# Patient Record
Sex: Female | Born: 1950 | Race: White | Hispanic: No | Smoking: Current some day smoker
Health system: Southern US, Community
[De-identification: ages and names within clinical notes are randomized; demographics above are authoritative.]

## PROBLEM LIST (undated history)

## (undated) DIAGNOSIS — J449 Chronic obstructive pulmonary disease, unspecified: Secondary | ICD-10-CM

## (undated) DIAGNOSIS — K219 Gastro-esophageal reflux disease without esophagitis: Secondary | ICD-10-CM

## (undated) DIAGNOSIS — I10 Essential (primary) hypertension: Secondary | ICD-10-CM

## (undated) DIAGNOSIS — F32A Depression, unspecified: Secondary | ICD-10-CM

## (undated) DIAGNOSIS — F329 Major depressive disorder, single episode, unspecified: Secondary | ICD-10-CM

## (undated) DIAGNOSIS — E785 Hyperlipidemia, unspecified: Secondary | ICD-10-CM

## (undated) HISTORY — DX: Essential (primary) hypertension: I10

## (undated) HISTORY — DX: Chronic obstructive pulmonary disease, unspecified: J44.9

## (undated) HISTORY — DX: Gastro-esophageal reflux disease without esophagitis: K21.9

## (undated) HISTORY — DX: Major depressive disorder, single episode, unspecified: F32.9

## (undated) HISTORY — DX: Depression, unspecified: F32.A

## (undated) HISTORY — DX: Hyperlipidemia, unspecified: E78.5

---

## 2002-06-06 HISTORY — PX: SHOULDER SURGERY: SHX246

## 2003-05-02 ENCOUNTER — Emergency Department (HOSPITAL_COMMUNITY): Admission: EM | Admit: 2003-05-02 | Discharge: 2003-05-02 | Payer: Self-pay | Admitting: *Deleted

## 2003-06-23 ENCOUNTER — Ambulatory Visit (HOSPITAL_COMMUNITY): Admission: RE | Admit: 2003-06-23 | Discharge: 2003-06-23 | Payer: Self-pay | Admitting: Orthopedic Surgery

## 2003-07-26 ENCOUNTER — Emergency Department (HOSPITAL_COMMUNITY): Admission: EM | Admit: 2003-07-26 | Discharge: 2003-07-26 | Payer: Self-pay | Admitting: Emergency Medicine

## 2003-10-05 HISTORY — PX: NECK SURGERY: SHX720

## 2003-10-16 ENCOUNTER — Inpatient Hospital Stay (HOSPITAL_COMMUNITY): Admission: RE | Admit: 2003-10-16 | Discharge: 2003-10-17 | Payer: Self-pay | Admitting: Orthopaedic Surgery

## 2003-10-29 ENCOUNTER — Emergency Department (HOSPITAL_COMMUNITY): Admission: EM | Admit: 2003-10-29 | Discharge: 2003-10-29 | Payer: Self-pay | Admitting: Emergency Medicine

## 2003-12-29 ENCOUNTER — Emergency Department (HOSPITAL_COMMUNITY): Admission: EM | Admit: 2003-12-29 | Discharge: 2003-12-29 | Payer: Self-pay

## 2004-06-07 ENCOUNTER — Emergency Department (HOSPITAL_COMMUNITY): Admission: EM | Admit: 2004-06-07 | Discharge: 2004-06-07 | Payer: Self-pay | Admitting: Family Medicine

## 2004-08-04 ENCOUNTER — Emergency Department: Payer: Self-pay | Admitting: Emergency Medicine

## 2005-02-13 ENCOUNTER — Emergency Department: Payer: Self-pay | Admitting: Unknown Physician Specialty

## 2005-03-19 ENCOUNTER — Emergency Department (HOSPITAL_COMMUNITY): Admission: EM | Admit: 2005-03-19 | Discharge: 2005-03-19 | Payer: Self-pay | Admitting: Emergency Medicine

## 2008-08-27 ENCOUNTER — Ambulatory Visit: Payer: Self-pay | Admitting: Unknown Physician Specialty

## 2008-09-09 ENCOUNTER — Ambulatory Visit: Payer: Self-pay | Admitting: Unknown Physician Specialty

## 2009-08-03 ENCOUNTER — Emergency Department (HOSPITAL_COMMUNITY): Admission: EM | Admit: 2009-08-03 | Discharge: 2009-08-04 | Payer: Self-pay | Admitting: Emergency Medicine

## 2010-06-27 ENCOUNTER — Encounter: Payer: Self-pay | Admitting: Orthopaedic Surgery

## 2010-10-22 NOTE — Op Note (Signed)
Terri Burns, Terri Burns                            ACCOUNT NO.:  [id number]   MEDICAL RECORD NO.:  [id number]                   PATIENT TYPE:  AMB   LOCATION:  DAY                                  FACILITY:  [facility]   PHYSICIAN:  Ronald A. Darrelyn Hillock, M.D.             DATE OF BIRTH:  Oct 13, 1950   DATE OF PROCEDURE:  06/23/2003  DATE OF DISCHARGE:                                 OPERATIVE REPORT   PREOPERATIVE DIAGNOSES:  1. Frozen shoulder, left shoulder.  2. Previous arthroscopic repair of rotator cuff and resection of distal     clavicle by another physician at [facility].   OPERATION:  1. Closed manipulation of the left shoulder under general anesthesia.  2. Injection of the left shoulder with 1 mL Depo-Medrol, 5 mL 0.5% Marcaine     and epinephrine.   SURGEON:  Georges Lynch. Darrelyn Hillock, M.D.   ASSISTANT:  Nurse.   DESCRIPTION OF PROCEDURE:  Under general anesthesia, a routine closed  manipulation of the left shoulder was carried out.  This manipulation was  done in a very gentle, easy manner.  Following this I did a sterile prep and  injected the left shoulder with 1 mL Depo-Medrol, 0.5 mL 0.5% Marcaine.  The  patient left the operating room in satisfactory condition.   FOLLOW-UP CARE:  She is going to be started immediately on therapy tomorrow.                                               Ronald A. Darrelyn Hillock, M.D.    RAG/MEDQ  D:  06/23/2003  T:  06/23/2003  Job:  [id number]

## 2010-10-22 NOTE — Op Note (Signed)
Terri Burns, Terri Burns                            ACCOUNT NO.:  [id number]   MEDICAL RECORD NO.:  [id number]                   PATIENT TYPE:  INP   LOCATION:  5039                                 FACILITY:  [facility]   PHYSICIAN:  Sharolyn Douglas, M.D.                     DATE OF BIRTH:  [birth year]   DATE OF PROCEDURE:  10/16/2003  DATE OF DISCHARGE:                                 OPERATIVE REPORT   DIAGNOSIS:  Degenerative disk disease, C5-6 and C6-7.   PROCEDURES:  1. Anterior cervical diskectomy, C5-6.  2. Anterior cervical arthrodesis, C5-6, placement of 7 mm allograft     prosthesis spacer packed with local autogenous bone graft.  3. Anterior cervical plating, C5-6, with the [employer] system.   SURGEON:  Sharolyn Douglas, M.D.   ASSISTANT:  Verlin Fester, P.A.   ANESTHESIA:  General endotracheal.   COMPLICATIONS:  None.   INDICATIONS:  The patient is a 60 year old female with a chronic history of  neck and left upper extremity pain.  She has advanced degenerative changes  at C5-6 and C6-7 with foraminal narrowing.  Her symptoms are felt to be  partially due to her neck and her left shoulder.  She is status post left  shoulder rotator cuff repair as well as closed manipulation.  Because of her  persistent symptoms unresponsive to conservative care, she has elected to  undergo ACDF at C5-6 and C6-7.  Intraoperatively we found the C6-7 level to  be autofused, and therefore the operation was limited to C5-6.   PROCEDURE:  The patient was properly identified in the holding area and  taken to the operating room.  She underwent general endotracheal anesthesia  without difficulty, given prophylactic IV antibiotics.  Carefully positioned  supine with the neck on the [place] head rest.  She was placed in slight  extension, five pounds of Holter traction applied, neck prepped and draped  in the usual sterile fashion.  A 4 cm incision made, left side of the neck,  level of the cricoid  cartilage.  Dissection carried sharply through the  platysma.  The interval between the SCM and strap muscles carried down to  the prevertebral space.  Esophagus, trachea, carotid sheath identified and  protected at all times.  C5-6 level easily identified by the large anterior  osteophytes.  Spinal needle placed confirming this location.  The longus  colli muscle was elevated out over the C5-6 and C6-7 disk spaces.  Deep  retractor placed.  Large anterior osteophytes at C5-6 were removed with  Leksell rongeur.  We found the disk space to be collapsed.  Caspar  distraction pins were placed in the C5 and C6 vertebral bodies.  Gentle  distraction was applied.  Curettes were used to complete a diskectomy back  to the posterior longitudinal ligament.  We used a high-speed bur to remove  the cartilaginous end  plates as well as the uncovertebral spurs.  Wide  foraminotomies were carried out bilaterally.  We then placed a 7 mm  allograft prosthesis spacer which had been packed with the local bone graft  obtained from the bur shavings.  This was carefully counter sunk 1 mm.  We  then turned our attention to exploring the C6-7 disk space.  There had been  an autofusion.  It was felt that the MRI scan showed patency of the foramen  and spinal canal and nothing would be gained by taking down the autofusion.  We elected to limit the operation to C5-6.  We then placed an anterior  cervical plate at E4-5 with four 12 mm screws.  We had excellent screw  purchase.  We ensured the locking mechanism engaged.  The wound was  irrigated.  Bleeding was controlled with bipolar electrocautery and Gelfoam.  The esophagus, trachea, and carotid sheath were examined.  There were no  apparent injuries.  We placed a deep Penrose drain.  Platysma closed with  interrupted 2-0 Vicryl, the subcutaneous layer closed with 3-0 Vicryl,  followed by a running 4-0 subcuticular Vicryl suture on the skin.  Benzoin  and Steri-Strips  placed.  Soft collar applied.  The patient was extubated  without difficulty, transferred to the recovery room in stable condition  able to move her upper and lower extremities.                                               Sharolyn Douglas, M.D.    MC/MEDQ  D:  10/16/2003  T:  10/16/2003  Job:  [id number]

## 2010-10-22 NOTE — H&P (Signed)
NAME:  Terri Burns, Terri Burns                            ACCOUNT NO.:  [id number]   MEDICAL RECORD NO.:  [id number]                   PATIENT TYPE:  INP   LOCATION:                                       FACILITY:  [facility]   PHYSICIAN:  Sharolyn Douglas, M.D.                     DATE OF BIRTH:  28-Mar-1951   DATE OF ADMISSION:  10/16/2003  DATE OF DISCHARGE:                                HISTORY & PHYSICAL   CHIEF COMPLAINT:  Pain in my neck and left shoulder.   HISTORY OF PRESENT ILLNESS:  This is a 60 year old lady with persistent left  shoulder pain.  The patient had undergone repair of the rotator cuff of the  left shoulder hoping that was the origin of her left shoulder pain, but  unfortunately this did not relieve all of her discomfort.  This surgery was  done by Dr. Darrelyn Hillock.  He ordered a cervical spine MRI which showed  degenerative disk disease at C5-6 and C6-7, with spondylosis.  Also, a small  protrusion was seen at C2, C3, C4, and C5.  She was referred to Dr. Noel Gerold  for further evaluation by Dr. Darrelyn Hillock.   We found this lady to be in rather persistent discomfort with a burning type  aching pain with radiation from the neck into the left shoulder.  She has  guarded the left upper extremity, and as far as her shoulder is concerned,  aggravates and exacerbates her discomfort.  She also has difficulty in range  of motion of the cervical spine as well.  All planes are noted to be quite  guarded with muscle spasms as well as increasing pain.  Fortunately, sensory  and motor is intact to the upper extremities.  It is felt this patient who  has had difficulty tolerating any non-steroidal anti-inflammatories, as well  as analgesics and muscle relaxants would benefit from surgery for  intervention, and is being admitted to [facility] for anterior cervical  diskectomy and fusion at C5-6 and C6-7, with allograft and plates.   We have reviewed her medical information from [facility]  in  [place], [place], from her doctor, Dr. Darden Dates K. Reddick.  She has been  cleared for surgery.  She has normal kidney function as well as thyroid.  CBC's were normal, as well as her chest x-ray.  Her triglycerides were  elevated at 286, as well as the sodium down at 132, otherwise normal  metabolic panel.   ALLERGIES:  The patient has extreme difficulty with any anti-inflammatories,  with nausea, vomiting, diarrhea, and dizziness.  She has no true medical  allergies as far as anaphylaxis is concerned.   CURRENT MEDICATIONS:  1. Enalapril/HCTZ 10/25 mg one daily.  2. She was given a prescription today for Percocet 5/325 mg and Robaxin 500     mg.  3. Allegra.  She is being treated by Dr. Kandice Robinsons for hypertension.   PAST SURGICAL HISTORY:  1. Basal cell carcinoma removed from her nose without sequela.  2. Left shoulder surgeries in May 2004 and January 2005.   FAMILY HISTORY:  Positive for heart disease and hypertension.   SOCIAL HISTORY:  The patient is a homemaker.  She smokes about 1/2 pack of  cigarettes per day.  Has three children.  Her husband will be the major  caregiver.   REVIEW OF SYSTEMS:  CNS:  No seizures, shoulder paralysis, numbness, double  vision.  The patient does have radicular pain in the left cervical  musculature as well as left shoulder.  CARDIOVASCULAR:  No chest pain, no  angina, no orthopnea.  RESPIRATORY:  No productive cough, no hemoptysis, no  shortness of breath.  GASTROINTESTINAL:  No nausea, vomiting, melena, or  bloody stools.  GENITOURINARY:  The patient has urinary frequency without  dysuria, burning, or hematuria.  MUSCULOSKELETAL:  Primarily in present  illness with her neck and left shoulder.   PHYSICAL EXAMINATION:  GENERAL:  Alert, cooperative, friendly, somewhat  nervous 60 year old white female.  She is accompanied by her husband and her  newborn grandchild whom they are taking care of as the mother had a  difficult delivery with  post-delivery problems.  The daughter remains  hospitalized.  VITAL SIGNS:  Blood pressure 132/86, pulse 74, respirations 12.  HEENT:  Normocephalic.  Wears glasses.  PERRLA.  EOM intact.  Oropharynx is  clear.  CHEST:  Clear to auscultation, no rhonchi, no rales.  HEART:  Regular rate and rhythm, no murmurs are heard.  ABDOMEN:  Soft, nontender, liver and spleen not felt.  GENITOURINARY:  Not done, not pertinent to present illness.  RECTAL:  Not done, not pertinent to present illness.  BREASTS:  Not done, not pertinent to present illness.  EXTREMITIES:  As in present illness above.   ADMISSION DIAGNOSES:  1. Cervical spondylosis.  2. Hypertension.   PLAN:  The patient is being admitted for anterior cervical diskectomy and  fusion of C5-6 and C6-7 with allograft and plate.  She has been fitted with  a cervical collar today that she will be wearing postoperatively.  She will  probably have a rigid collar postoperatively.  I advised her that it would  be best for her to quit smoking or even to cut back as much as she possibly  could so as not to affect the healing and bony union.  The complications of  surgery, including failure of hardware and possible non-union have been  discussed with the patient and her husband again today.      Dooley L. Cherlynn June.                 Sharolyn Douglas, M.D.    DLU/MEDQ  D:  10/09/2003  T:  10/09/2003  Job:  [id number]   cc:   Dr. Thayer Headings. [facility]  [address]  [place], [place]  fax # [phone]

## 2011-03-09 ENCOUNTER — Ambulatory Visit: Payer: Self-pay | Admitting: Anesthesiology

## 2011-05-03 ENCOUNTER — Ambulatory Visit: Payer: Self-pay | Admitting: Specialist

## 2011-06-21 DIAGNOSIS — J449 Chronic obstructive pulmonary disease, unspecified: Secondary | ICD-10-CM | POA: Diagnosis not present

## 2011-06-21 DIAGNOSIS — R0602 Shortness of breath: Secondary | ICD-10-CM | POA: Diagnosis not present

## 2011-06-21 DIAGNOSIS — R05 Cough: Secondary | ICD-10-CM | POA: Diagnosis not present

## 2011-06-21 DIAGNOSIS — R059 Cough, unspecified: Secondary | ICD-10-CM | POA: Diagnosis not present

## 2011-06-21 DIAGNOSIS — R918 Other nonspecific abnormal finding of lung field: Secondary | ICD-10-CM | POA: Diagnosis not present

## 2011-08-31 DIAGNOSIS — J45909 Unspecified asthma, uncomplicated: Secondary | ICD-10-CM | POA: Diagnosis not present

## 2011-08-31 DIAGNOSIS — I10 Essential (primary) hypertension: Secondary | ICD-10-CM | POA: Diagnosis not present

## 2011-08-31 DIAGNOSIS — K219 Gastro-esophageal reflux disease without esophagitis: Secondary | ICD-10-CM | POA: Diagnosis not present

## 2011-12-05 DIAGNOSIS — J449 Chronic obstructive pulmonary disease, unspecified: Secondary | ICD-10-CM | POA: Diagnosis not present

## 2011-12-05 DIAGNOSIS — F3289 Other specified depressive episodes: Secondary | ICD-10-CM | POA: Diagnosis not present

## 2011-12-05 DIAGNOSIS — K219 Gastro-esophageal reflux disease without esophagitis: Secondary | ICD-10-CM | POA: Diagnosis not present

## 2011-12-05 DIAGNOSIS — F329 Major depressive disorder, single episode, unspecified: Secondary | ICD-10-CM | POA: Diagnosis not present

## 2011-12-05 DIAGNOSIS — G8929 Other chronic pain: Secondary | ICD-10-CM | POA: Diagnosis not present

## 2011-12-05 DIAGNOSIS — Z79899 Other long term (current) drug therapy: Secondary | ICD-10-CM | POA: Diagnosis not present

## 2011-12-05 DIAGNOSIS — I10 Essential (primary) hypertension: Secondary | ICD-10-CM | POA: Diagnosis not present

## 2012-01-05 DIAGNOSIS — I10 Essential (primary) hypertension: Secondary | ICD-10-CM | POA: Diagnosis not present

## 2012-01-05 DIAGNOSIS — M5030 Other cervical disc degeneration, unspecified cervical region: Secondary | ICD-10-CM | POA: Diagnosis not present

## 2012-03-26 DIAGNOSIS — Z23 Encounter for immunization: Secondary | ICD-10-CM | POA: Diagnosis not present

## 2012-04-26 DIAGNOSIS — M542 Cervicalgia: Secondary | ICD-10-CM | POA: Diagnosis not present

## 2012-05-25 ENCOUNTER — Ambulatory Visit: Payer: Self-pay | Admitting: Family Medicine

## 2012-05-25 DIAGNOSIS — R6884 Jaw pain: Secondary | ICD-10-CM | POA: Diagnosis not present

## 2012-05-25 DIAGNOSIS — M542 Cervicalgia: Secondary | ICD-10-CM | POA: Diagnosis not present

## 2012-06-25 DIAGNOSIS — M542 Cervicalgia: Secondary | ICD-10-CM | POA: Diagnosis not present

## 2012-07-26 DIAGNOSIS — M5412 Radiculopathy, cervical region: Secondary | ICD-10-CM | POA: Diagnosis not present

## 2012-07-26 DIAGNOSIS — J019 Acute sinusitis, unspecified: Secondary | ICD-10-CM | POA: Diagnosis not present

## 2012-08-23 DIAGNOSIS — I10 Essential (primary) hypertension: Secondary | ICD-10-CM | POA: Diagnosis not present

## 2012-08-23 DIAGNOSIS — M5030 Other cervical disc degeneration, unspecified cervical region: Secondary | ICD-10-CM | POA: Diagnosis not present

## 2012-08-29 ENCOUNTER — Ambulatory Visit: Payer: Self-pay | Admitting: Family Medicine

## 2012-08-29 DIAGNOSIS — Z9889 Other specified postprocedural states: Secondary | ICD-10-CM | POA: Diagnosis not present

## 2012-08-29 DIAGNOSIS — M5030 Other cervical disc degeneration, unspecified cervical region: Secondary | ICD-10-CM | POA: Diagnosis not present

## 2012-08-29 DIAGNOSIS — M5020 Other cervical disc displacement, unspecified cervical region: Secondary | ICD-10-CM | POA: Diagnosis not present

## 2012-09-21 DIAGNOSIS — M47812 Spondylosis without myelopathy or radiculopathy, cervical region: Secondary | ICD-10-CM | POA: Diagnosis not present

## 2012-09-21 DIAGNOSIS — J449 Chronic obstructive pulmonary disease, unspecified: Secondary | ICD-10-CM | POA: Diagnosis not present

## 2013-03-18 DIAGNOSIS — Z23 Encounter for immunization: Secondary | ICD-10-CM | POA: Diagnosis not present

## 2013-05-27 DIAGNOSIS — Z01419 Encounter for gynecological examination (general) (routine) without abnormal findings: Secondary | ICD-10-CM | POA: Diagnosis not present

## 2013-05-27 DIAGNOSIS — Z23 Encounter for immunization: Secondary | ICD-10-CM | POA: Diagnosis not present

## 2013-05-27 DIAGNOSIS — J449 Chronic obstructive pulmonary disease, unspecified: Secondary | ICD-10-CM | POA: Diagnosis not present

## 2013-05-27 DIAGNOSIS — Z0000 Encounter for general adult medical examination without abnormal findings: Secondary | ICD-10-CM | POA: Diagnosis not present

## 2013-05-29 DIAGNOSIS — E876 Hypokalemia: Secondary | ICD-10-CM | POA: Diagnosis not present

## 2013-06-03 ENCOUNTER — Inpatient Hospital Stay: Payer: Self-pay | Admitting: Student

## 2013-06-03 DIAGNOSIS — I10 Essential (primary) hypertension: Secondary | ICD-10-CM | POA: Diagnosis not present

## 2013-06-03 DIAGNOSIS — J449 Chronic obstructive pulmonary disease, unspecified: Secondary | ICD-10-CM | POA: Diagnosis not present

## 2013-06-03 DIAGNOSIS — E871 Hypo-osmolality and hyponatremia: Secondary | ICD-10-CM | POA: Diagnosis not present

## 2013-06-03 DIAGNOSIS — F3289 Other specified depressive episodes: Secondary | ICD-10-CM | POA: Diagnosis present

## 2013-06-03 DIAGNOSIS — G459 Transient cerebral ischemic attack, unspecified: Secondary | ICD-10-CM | POA: Diagnosis not present

## 2013-06-03 DIAGNOSIS — R062 Wheezing: Secondary | ICD-10-CM | POA: Diagnosis not present

## 2013-06-03 DIAGNOSIS — F329 Major depressive disorder, single episode, unspecified: Secondary | ICD-10-CM | POA: Diagnosis present

## 2013-06-03 DIAGNOSIS — I2510 Atherosclerotic heart disease of native coronary artery without angina pectoris: Secondary | ICD-10-CM | POA: Diagnosis present

## 2013-06-03 DIAGNOSIS — F17200 Nicotine dependence, unspecified, uncomplicated: Secondary | ICD-10-CM | POA: Diagnosis present

## 2013-06-03 DIAGNOSIS — J441 Chronic obstructive pulmonary disease with (acute) exacerbation: Secondary | ICD-10-CM | POA: Diagnosis not present

## 2013-06-03 DIAGNOSIS — Z8249 Family history of ischemic heart disease and other diseases of the circulatory system: Secondary | ICD-10-CM | POA: Diagnosis not present

## 2013-06-03 DIAGNOSIS — E876 Hypokalemia: Secondary | ICD-10-CM | POA: Diagnosis not present

## 2013-06-03 DIAGNOSIS — I959 Hypotension, unspecified: Secondary | ICD-10-CM | POA: Diagnosis not present

## 2013-06-03 DIAGNOSIS — Z880 Allergy status to penicillin: Secondary | ICD-10-CM | POA: Diagnosis not present

## 2013-06-03 DIAGNOSIS — J440 Chronic obstructive pulmonary disease with acute lower respiratory infection: Secondary | ICD-10-CM | POA: Diagnosis not present

## 2013-06-03 DIAGNOSIS — M1990 Unspecified osteoarthritis, unspecified site: Secondary | ICD-10-CM | POA: Diagnosis not present

## 2013-06-03 DIAGNOSIS — J438 Other emphysema: Secondary | ICD-10-CM | POA: Diagnosis not present

## 2013-06-03 DIAGNOSIS — J40 Bronchitis, not specified as acute or chronic: Secondary | ICD-10-CM | POA: Diagnosis not present

## 2013-06-03 LAB — BASIC METABOLIC PANEL
Chloride: 100 mmol/L (ref 98–107)
Co2: 24 mmol/L (ref 21–32)
EGFR (Non-African Amer.): >60
Sodium: 130 mmol/L — ABNORMAL LOW (ref 136–145)

## 2013-06-03 LAB — CBC
HCT: 37.8 % (ref 35.0–47.0)
MCH: 27.8 pg (ref 26.0–34.0)
MCV: 84 fL (ref 80–100)
RBC: 4.52 10*6/uL (ref 3.80–5.20)

## 2013-06-03 LAB — RAPID INFLUENZA A&B ANTIGENS

## 2013-06-03 LAB — TROPONIN I: Troponin-I: <0.02 ng/mL

## 2013-06-04 LAB — BASIC METABOLIC PANEL
BUN: 13 mg/dL (ref 7–18)
Calcium, Total: 8.4 mg/dL — ABNORMAL LOW (ref 8.5–10.1)
Chloride: 100 mmol/L (ref 98–107)
Creatinine: 0.76 mg/dL (ref 0.60–1.30)
Potassium: 2.9 mmol/L — ABNORMAL LOW (ref 3.5–5.1)
Sodium: 131 mmol/L — ABNORMAL LOW (ref 136–145)

## 2013-06-04 LAB — TROPONIN I
Troponin-I: <0.02 ng/mL
Troponin-I: <0.02 ng/mL

## 2013-06-04 LAB — CK-MB
CK-MB: 3.4 ng/mL (ref 0.5–3.6)
CK-MB: 3.8 ng/mL — ABNORMAL HIGH (ref 0.5–3.6)

## 2013-06-05 LAB — BASIC METABOLIC PANEL
Anion Gap: 9 (ref 7–16)
Calcium, Total: 8.1 mg/dL — ABNORMAL LOW (ref 8.5–10.1)
Chloride: 106 mmol/L (ref 98–107)
Creatinine: 0.59 mg/dL — ABNORMAL LOW (ref 0.60–1.30)
EGFR (African American): >60
EGFR (Non-African Amer.): >60
Glucose: 135 mg/dL — ABNORMAL HIGH (ref 65–99)
Osmolality: 276 (ref 275–301)
Potassium: 3.2 mmol/L — ABNORMAL LOW (ref 3.5–5.1)
Sodium: 137 mmol/L (ref 136–145)

## 2013-06-05 LAB — CBC WITH DIFFERENTIAL/PLATELET
Basophil %: 0.1 %
Eosinophil #: 0 10*3/uL (ref 0.0–0.7)
HCT: 31.9 % — ABNORMAL LOW (ref 35.0–47.0)
Lymphocyte %: 6.1 %
MCH: 28.2 pg (ref 26.0–34.0)
MCHC: 33.6 g/dL (ref 32.0–36.0)
MCV: 84 fL (ref 80–100)
Monocyte #: 0.4 x10 3/mm (ref 0.2–0.9)
Monocyte %: 4 %
Neutrophil #: 8.9 10*3/uL — ABNORMAL HIGH (ref 1.4–6.5)
Neutrophil %: 89.7 %
Platelet: 223 10*3/uL (ref 150–440)
RDW: 15.2 % — ABNORMAL HIGH (ref 11.5–14.5)
WBC: 9.9 10*3/uL (ref 3.6–11.0)

## 2013-06-05 LAB — MAGNESIUM: Magnesium: 1.7 mg/dL — ABNORMAL LOW

## 2013-06-08 LAB — CULTURE, BLOOD (SINGLE)

## 2013-06-09 LAB — CREATININE, SERUM
Creatinine: 0.48 mg/dL — ABNORMAL LOW (ref 0.60–1.30)
EGFR (Non-African Amer.): >60

## 2013-06-13 LAB — EXPECTORATED SPUTUM ASSESSMENT W REFEX TO RESP CULTURE

## 2013-06-14 DIAGNOSIS — J441 Chronic obstructive pulmonary disease with (acute) exacerbation: Secondary | ICD-10-CM | POA: Diagnosis not present

## 2013-06-14 DIAGNOSIS — E876 Hypokalemia: Secondary | ICD-10-CM | POA: Diagnosis not present

## 2013-08-06 ENCOUNTER — Ambulatory Visit: Payer: Self-pay | Admitting: Family Medicine

## 2013-08-06 DIAGNOSIS — J984 Other disorders of lung: Secondary | ICD-10-CM | POA: Diagnosis not present

## 2013-08-06 DIAGNOSIS — J8410 Pulmonary fibrosis, unspecified: Secondary | ICD-10-CM | POA: Diagnosis not present

## 2013-08-06 DIAGNOSIS — R059 Cough, unspecified: Secondary | ICD-10-CM | POA: Diagnosis not present

## 2013-08-06 DIAGNOSIS — J449 Chronic obstructive pulmonary disease, unspecified: Secondary | ICD-10-CM | POA: Diagnosis not present

## 2013-08-06 DIAGNOSIS — R05 Cough: Secondary | ICD-10-CM | POA: Diagnosis not present

## 2013-09-16 ENCOUNTER — Ambulatory Visit: Payer: Self-pay | Admitting: Family Medicine

## 2013-09-16 DIAGNOSIS — J449 Chronic obstructive pulmonary disease, unspecified: Secondary | ICD-10-CM | POA: Diagnosis not present

## 2013-11-08 DIAGNOSIS — I10 Essential (primary) hypertension: Secondary | ICD-10-CM | POA: Diagnosis not present

## 2013-11-08 DIAGNOSIS — M5030 Other cervical disc degeneration, unspecified cervical region: Secondary | ICD-10-CM | POA: Diagnosis not present

## 2013-11-08 DIAGNOSIS — J449 Chronic obstructive pulmonary disease, unspecified: Secondary | ICD-10-CM | POA: Diagnosis not present

## 2013-12-09 DIAGNOSIS — Z79899 Other long term (current) drug therapy: Secondary | ICD-10-CM | POA: Diagnosis not present

## 2013-12-09 DIAGNOSIS — I10 Essential (primary) hypertension: Secondary | ICD-10-CM | POA: Diagnosis not present

## 2013-12-09 DIAGNOSIS — F329 Major depressive disorder, single episode, unspecified: Secondary | ICD-10-CM | POA: Diagnosis not present

## 2013-12-09 DIAGNOSIS — F3289 Other specified depressive episodes: Secondary | ICD-10-CM | POA: Diagnosis not present

## 2013-12-09 DIAGNOSIS — G8929 Other chronic pain: Secondary | ICD-10-CM | POA: Diagnosis not present

## 2013-12-09 DIAGNOSIS — M5030 Other cervical disc degeneration, unspecified cervical region: Secondary | ICD-10-CM | POA: Diagnosis not present

## 2014-04-15 ENCOUNTER — Ambulatory Visit: Payer: Self-pay | Admitting: Family Medicine

## 2014-04-15 DIAGNOSIS — R05 Cough: Secondary | ICD-10-CM | POA: Diagnosis not present

## 2014-09-09 DIAGNOSIS — I10 Essential (primary) hypertension: Secondary | ICD-10-CM | POA: Diagnosis not present

## 2014-09-09 DIAGNOSIS — J302 Other seasonal allergic rhinitis: Secondary | ICD-10-CM | POA: Diagnosis not present

## 2014-09-09 DIAGNOSIS — J449 Chronic obstructive pulmonary disease, unspecified: Secondary | ICD-10-CM | POA: Diagnosis not present

## 2014-09-09 DIAGNOSIS — F329 Major depressive disorder, single episode, unspecified: Secondary | ICD-10-CM | POA: Diagnosis not present

## 2014-09-27 NOTE — H&P (Signed)
PATIENT NAME:  Terri Burns, Terri Burns MR#:  [id number] DATE OF BIRTH:  1950/08/28  DATE OF ADMISSION:  06/03/2013  PRIMARY CARE PHYSICIAN:  Dr. Manuella Ghazi  REFERRING PHYSICIAN:  Dr. Jacqualine Code  CHIEF COMPLAINT: Shortness of breath and wheezing.   HISTORY OF PRESENT ILLNESS: The patient is a 64 year old year-old pleasant Caucasian female with past medical history of chronic obstructive pulmonary disease, does not live on oxygen, hypertension and degenerative joint disease. presenting with two day history of shortness of breath as well wheezing. She has been getting coughing with minimal exertion and denies any phlegm. She received flu vaccine this year.  She is complaining of low-grade fever, feels congested. Her shortness of breath is getting worse, so she came into the ER. The patient was given nebulizer treatment and p.o. prednisone. Hospitalist team is called to admit the patient. Chest x-ray did not reveal any pneumonia and acute infiltrates. A flu test was negative in the ER. During my examination, the patient is still feeling short of breath, but much better. No family members at bedside. Denies any other complaints. She was admitted to Intensive Care Unit for chronic obstructive pulmonary disease exacerbation, never got intubated   PAST MEDICAL HISTORY: Hypertension, degenerative joint disease and chronic obstructive pulmonary disease.  PAST SURGICAL HISTORY: Shoulder surgery, neck surgery.   ALLERGIES: SHE IS ALLERGIC TO PENICILLIN GIVES HER ITCHING AND NSAIDS.    PSYCHOSOCIAL HISTORY: Lives at home with daughter. Smokes half pack a day, started smoking at age 70 and quit smoking 1 to 2 weeks ago. Denies alcohol or illicit drug usage.   FAMILY HISTORY: Mother disease transient ischemic attack had history of hypertension. Father had history of heart attack.   HOME MEDICATIONS: Symbicort 2 puffs inhalation 2 times a day, ProAir 2 puffs inhalation 4 times as needed, Prilosec 20 mg 2 times a day, Percocet  5/325 three times a day, metoprolol tartrate 100 mg 2 times a day, lisinopril/hydrochlorothiazide  20/12.5 two times a day, citalopram 40 mg once daily.    REVIEW OF SYSTEMS:   GENERAL: Complaining of low-grade fever. Complaining of fatigue and weakness.  EYES: Denies blurry vision, double vision.  ENT: Denies epistaxis or discharge.  RESPIRATION: Complaining of coughing, wheezing and chronic obstructive pulmonary disease.  CARDIOVASCULAR: Has chest tightness. Denies any palpitations.  GASTROINTESTINAL: Denies nausea, vomiting, diarrhea.  GENITOURINARY: No dysuria or hematuria.  GYNECOLOGIC AND BREASTS: Denies breast mass or vaginal discharge.  ENDOCRINE: Denies polyuria or nocturia or thyroid problems.  HEMATOLOGIC AND LYMPHATIC: No anemia, easy bruising, bleeding.  INTEGUMENTARY: No acne, rash, lesions.  MUSCULOSKELETAL: No joint pain in neck and back. She has chronic history of degenerative joint disease.  PSYCHIATRIC: No ADD, OCD.  NEUROLOGIC:  Denies vertigo, ataxia.   PHYSICAL EXAMINATION: VITAL SIGNS: Temperature 98.2, pulse 101, respirations 20, blood pressure is 101/69, pulse oximetry 96% on room air.  GENERAL APPEARANCE: Not in acute distress. Moderately built and nourished.  Looks pale. HEENT: Normocephalic, atraumatic. Pupils are equally reacting to light and accommodation. No scleral icterus. No conjunctival injection. Nose is stuffy, positive postnasal drip. Uvula is midline moist. Moist mucus membranes.  NECK: Supple. No JVD. No thyromegaly. Range of motion is intact.  LUNGS: Diffuse wheezing is present bilaterally. No accessory muscle usage. Moderate air entry. No crackles, no anterior chest wall tenderness on palpation.  CARDIAC: S1, S2 normal. Regular rate and rhythm. No murmurs.  GASTROINTESTINAL: Soft. Bowel sounds are positive in all four quadrants. Nontender, nondistended. No hepatosplenomegaly. No masses felt.  NEUROLOGIC: Awake  and oriented x 3. Motor and sensory are  grossly intact. Reflexes are 2+.  EXTREMITIES: No edema. No cyanosis. No clubbing.   SKIN: Warm to touch. Normal turgor. No rashes. No lesions.  MUSCULOSKELETAL: No joint effusion, tenderness, erythema.  PSYCHIATRIC: Normal mood and affect.   LABORATORY, DIAGNOSTIC AND RADIOLOGIC DATA:  Troponin less than 0.02. CBC normal. Influenza test is negative. PH 7.39, pCO2 36 BMP is normal except sodium is 130, potassium 2.8. Anion gap is 6.   Chest x-ray, PA and lateral views:  Lungs have hyperexpansion and bronchitic change without recent acute cardiopulmonary disease. Architectural distortion and volume loss involving the left mid and lower lung, similar to the remote chest CT.   A 12-lead EKG has revealed normal sinus rhythm, normal PR and QRS interval. Segment flattening was noted in V5 and V6 which suggests artifact, no acute ST-T wave changes.   ASSESSMENT AND PLAN: A 64 year old Caucasian female brought into the ER with a two day history of shortness of breath, wheezing and coughing. She will be admitted with following assessment and plan.  1.  Acute exacerbation of chronic obstructive pulmonary disease.  We will admit her to MedSurg floor. We will provide her IV Solu-Medrol, nebulizer treatments.  We will provide her Zosyn and Zithromax for possible acute bronchitis.  2.  Hypokalemia. The patient was given potassium supplement. We will check her potassium and magnesium in the morning.  3.  Hypertension. Resume her home medication. Blood pressure is on the lower side, we will of the hold off on the lisinopril HCTZ.  4.  Degenerative joint disease. Pain management on as needed basis.  5.  Tobacco dependent. The patient was counseled to quit smoking.  6.  We will provide her GI and deep vein thrombosis prophylaxis.   CODE STATUS: She is full code. Children are medical power of attorney.   Diagnosis and plan of care was discussed in detail with the patient and she agreed with the plan.   TOTAL  TIME SPENT ON ADMISSION:  45 minutes.    ____________________________ Nicholes Mango, MD ag:cc D: 06/04/2013 00:47:27 ET T: 06/04/2013 01:51:21 ET JOB#: [id number]  cc: Nicholes Mango, MD, <Dictator> Dr. Laverle Patter MD ELECTRONICALLY SIGNED 06/14/2013 7:17

## 2014-09-27 NOTE — Discharge Summary (Signed)
PATIENT NAME:  Terri Burns, Terri Burns MR#:  [id number] DATE OF BIRTH:  Jun 04, 1951  DATE OF ADMISSION:  06/03/2013 DATE OF DISCHARGE:  06/09/2013  CHIEF COMPLAINT: Shortness of breath and wheezing.   PRIMARY CARE PHYSICIAN: Dr. Manuella Ghazi.   DISCHARGE DIAGNOSES:  1. Acute chronic obstructive pulmonary disease exacerbation with acute bronchitis.  2. Hypokalemia.  3. Depression.  4. Hyponatremia.  5. Bout of hypotension, resolved.  6. Hypomagnesemia.   DISCHARGE MEDICATIONS: Lisinopril/HCTZ 20/12.5 one tab 2 times a day, Prilosec 20 mg daily x 2, citalopram 40 mg once a day, Symbicort 160/4.5 mcg 2 puffs 2 times a day, ProAir 2 puffs 4 times a day as needed for wheezing, Percocet 10/325 three times a day as needed 1 tab for pain, tiotropium 18 mcg inhaled 1 cap daily, prednisone taper 50 mg per day, then taper by 10 mg until done in 5 days, metoprolol 25 mg 2 times a day.   DIET: Low sodium.   ACTIVITY: As tolerated.   FOLLOWUP: Please follow with PCP within 1 to 2 weeks.   DISPOSITION: Home.   SIGNIFICANT LABS AND IMAGING: Troponins were negative x 3. Initial potassium 2.8, sodium 130, BUN 12, creatinine 0.91. White count of 10.4. Blood cultures negative. Rapid flu was negative. Sputum cultures have had colonies too small to read from the 3rd of January. Chest x-ray, PA and lateral, on admission showing lung hyperexpansion and bronchitic changes without acute cardiopulmonary disease. CT chest with contrast on January 2nd showing scattered pulmonary parenchymal scarring, appears postinfectious.   HISTORY OF PRESENT ILLNESS AND HOSPITAL COURSE: For full details of H and P, please see the dictation on December 29th by Dr. Margaretmary Eddy, but briefly, this is a 64 year old female with history of COPD, tobacco abuse, hypertension who presented with shortness of breath and wheezing for a couple of days. She has been having coughing with minimal exertion, without any sputum production. She was given some nebulizer  treatments and steroids, and hospitalist service was asked for admission. She was admitted to our group for acute COPD flare with acute bronchitis with IV steroids nebulizers. The patient was also started on antibiotics. Her hypokalemia was corrected. The patient did have mild hyponatremia and was started on some normal saline. The patient's blood pressure was also initially a little on the lower side, and some of the blood pressure medications were held. The patient was slow to recover; however, at this point, has done significantly better. She has been weaned off of oxygen, is ambulating. Her hypokalemia and mild hyponatremia have resolved. She was started on Spiriva, and at this point, she will be getting discharged with outpatient followup.   TOTAL TIME SPENT: 33 minutes.   The patient is FULL CODE.    ____________________________ Vivien Presto, MD sa:gb D: 06/09/2013 17:23:45 ET T: 06/09/2013 23:39:39 ET JOB#: [id number]  cc: Vivien Presto, MD, <Dictator> Dr. Lollie Marrow [facility] MD ELECTRONICALLY SIGNED 06/18/2013 11:29

## 2014-12-04 ENCOUNTER — Other Ambulatory Visit: Payer: Self-pay | Admitting: Family Medicine

## 2014-12-04 DIAGNOSIS — F329 Major depressive disorder, single episode, unspecified: Secondary | ICD-10-CM

## 2014-12-04 DIAGNOSIS — I10 Essential (primary) hypertension: Secondary | ICD-10-CM

## 2014-12-04 DIAGNOSIS — F32A Depression, unspecified: Secondary | ICD-10-CM

## 2014-12-04 MED ORDER — METOPROLOL SUCCINATE ER 25 MG PO TB24: 25.0000 mg | ORAL_TABLET | Freq: Every day | ORAL | Status: DC

## 2014-12-04 MED ORDER — CITALOPRAM HYDROBROMIDE 20 MG PO TABS: 20.0000 mg | ORAL_TABLET | Freq: Every day | ORAL | Status: DC

## 2015-05-08 ENCOUNTER — Telehealth: Payer: Self-pay | Admitting: Family Medicine

## 2015-05-08 DIAGNOSIS — F329 Major depressive disorder, single episode, unspecified: Secondary | ICD-10-CM

## 2015-05-08 DIAGNOSIS — F32A Depression, unspecified: Secondary | ICD-10-CM

## 2015-05-08 DIAGNOSIS — I10 Essential (primary) hypertension: Secondary | ICD-10-CM

## 2015-05-08 MED ORDER — CITALOPRAM HYDROBROMIDE 20 MG PO TABS: 20.0000 mg | ORAL_TABLET | Freq: Every day | ORAL | Status: DC

## 2015-05-08 MED ORDER — METOPROLOL SUCCINATE ER 25 MG PO TB24: 25.0000 mg | ORAL_TABLET | Freq: Every day | ORAL | Status: DC

## 2015-05-08 MED ORDER — BUDESONIDE-FORMOTEROL FUMARATE 160-4.5 MCG/ACT IN AERO: 2.0000 | INHALATION_SPRAY | Freq: Two times a day (BID) | RESPIRATORY_TRACT | Status: DC

## 2015-05-08 MED ORDER — OMEPRAZOLE 20 MG PO CPDR: 20.0000 mg | DELAYED_RELEASE_CAPSULE | Freq: Every day | ORAL | Status: DC

## 2015-05-08 MED ORDER — LISINOPRIL-HYDROCHLOROTHIAZIDE 20-25 MG PO TABS: 1.0000 | ORAL_TABLET | Freq: Every day | ORAL | Status: DC

## 2015-05-08 NOTE — Telephone Encounter (Signed)
Patient has no refills on Lisinopril, Metopolol, Citalopram, Omeprazole, and Symbicort. She has not been seen since May at the old office and she did schedule appointment for this coming Tuesday. She would like to know if you would send her refills to [facility] to last until her appointment.

## 2015-05-08 NOTE — Telephone Encounter (Signed)
Medication has been refilled and sent to [facility] [address] 

## 2015-05-12 ENCOUNTER — Encounter: Payer: Self-pay | Admitting: Family Medicine

## 2015-05-12 ENCOUNTER — Ambulatory Visit (INDEPENDENT_AMBULATORY_CARE_PROVIDER_SITE_OTHER): Payer: Medicare Other | Admitting: Family Medicine

## 2015-05-12 VITALS — BP 140/72 | HR 60 | Temp 98.0°F | Resp 16 | Ht 60.0 in | Wt 124.4 lb

## 2015-05-12 DIAGNOSIS — I10 Essential (primary) hypertension: Secondary | ICD-10-CM | POA: Insufficient documentation

## 2015-05-12 DIAGNOSIS — J302 Other seasonal allergic rhinitis: Secondary | ICD-10-CM | POA: Diagnosis not present

## 2015-05-12 DIAGNOSIS — F325 Major depressive disorder, single episode, in full remission: Secondary | ICD-10-CM | POA: Insufficient documentation

## 2015-05-12 DIAGNOSIS — K219 Gastro-esophageal reflux disease without esophagitis: Secondary | ICD-10-CM | POA: Diagnosis not present

## 2015-05-12 DIAGNOSIS — F32A Depression, unspecified: Secondary | ICD-10-CM

## 2015-05-12 DIAGNOSIS — J42 Unspecified chronic bronchitis: Secondary | ICD-10-CM

## 2015-05-12 DIAGNOSIS — Z23 Encounter for immunization: Secondary | ICD-10-CM

## 2015-05-12 DIAGNOSIS — J449 Chronic obstructive pulmonary disease, unspecified: Secondary | ICD-10-CM | POA: Insufficient documentation

## 2015-05-12 DIAGNOSIS — F329 Major depressive disorder, single episode, unspecified: Secondary | ICD-10-CM | POA: Diagnosis not present

## 2015-05-12 DIAGNOSIS — M5030 Other cervical disc degeneration, unspecified cervical region: Secondary | ICD-10-CM | POA: Insufficient documentation

## 2015-05-12 DIAGNOSIS — J439 Emphysema, unspecified: Secondary | ICD-10-CM | POA: Insufficient documentation

## 2015-05-12 MED ORDER — FLUTICASONE PROPIONATE 50 MCG/ACT NA SUSP: 2.0000 | Freq: Every day | NASAL | Status: DC

## 2015-05-12 MED ORDER — METOPROLOL SUCCINATE ER 25 MG PO TB24: 25.0000 mg | ORAL_TABLET | Freq: Every day | ORAL | Status: DC

## 2015-05-12 MED ORDER — ALBUTEROL SULFATE HFA 108 (90 BASE) MCG/ACT IN AERS: 2.0000 | INHALATION_SPRAY | Freq: Four times a day (QID) | RESPIRATORY_TRACT | Status: DC | PRN

## 2015-05-12 MED ORDER — LISINOPRIL-HYDROCHLOROTHIAZIDE 20-25 MG PO TABS: 1.0000 | ORAL_TABLET | Freq: Every day | ORAL | Status: DC

## 2015-05-12 MED ORDER — OMEPRAZOLE 20 MG PO CPDR: 20.0000 mg | DELAYED_RELEASE_CAPSULE | Freq: Every day | ORAL | Status: DC

## 2015-05-12 MED ORDER — BUDESONIDE-FORMOTEROL FUMARATE 160-4.5 MCG/ACT IN AERO: 2.0000 | INHALATION_SPRAY | Freq: Two times a day (BID) | RESPIRATORY_TRACT | Status: DC

## 2015-05-12 MED ORDER — CITALOPRAM HYDROBROMIDE 20 MG PO TABS: 20.0000 mg | ORAL_TABLET | Freq: Every day | ORAL | Status: DC

## 2015-05-12 NOTE — Progress Notes (Signed)
Name: Terri Burns   MRN: [id number]    DOB: 1951-04-27   Date:05/12/2015       Progress Note  Subjective  Chief Complaint  Chief Complaint  Patient presents with  . Medication Refill    ProAir Hfa  / fluticasone 13mcg  . Hypertension  . COPD    Hypertension This is a chronic problem. The problem is unchanged. The problem is controlled. Pertinent negatives include no blurred vision, chest pain, headaches, palpitations or shortness of breath. Past treatments include beta blockers, diuretics and ACE inhibitors. The current treatment provides significant improvement. There is no history of kidney disease, CAD/MI or CVA.  Depression      (Symptoms reasonably controlled as long as she is on medication.)  This is a chronic problem.  The onset quality is gradual.   Associated symptoms include no helplessness, no hopelessness, does not have insomnia, no decreased interest, no headaches and not sad.  Past treatments include SSRIs - Selective serotonin reuptake inhibitors.  Compliance with treatment is good.  Previous treatment provided significant relief. Gastroesophageal Reflux She reports no chest pain, no coughing, no dysphagia, no heartburn, no nausea or no sore throat. symptoms reasonably controlled as long as she is on medication.. This is a chronic problem. The problem has been unchanged. She has tried a PPI for the symptoms. The treatment provided significant relief.   COPD Pt. Has history of COPD, stable on Symbicort and Pro Air. She feels well, no coughing, wheezing, fevers, chills, or sputum production.   Runny Nose Pt. Requesting refill for Flonase, taken for runny nose, mostly from weather changes and allergies. Symptoms respond well to Flonase.   Past Medical History  Diagnosis Date  . Hypertension   . COPD (chronic obstructive pulmonary disease) ([place])     History reviewed. No pertinent past surgical history.  History reviewed. No pertinent family history.  Social History    Social History  . Marital Status: Widowed    Spouse Name: N/A  . Number of Children: N/A  . Years of Education: N/A   Occupational History  . Not on file.   Social History Main Topics  . Smoking status: Current Every Day Smoker -- 1.00 packs/day    Types: Cigarettes  . Smokeless tobacco: Never Used  . Alcohol Use: No  . Drug Use: No  . Sexual Activity: No   Other Topics Concern  . Not on file   Social History Narrative  . No narrative on file     Current outpatient prescriptions:  .  albuterol (PROAIR HFA) 108 (90 BASE) MCG/ACT inhaler, Inhale into the lungs., Disp: , Rfl:  .  budesonide-formoterol (SYMBICORT) 160-4.5 MCG/ACT inhaler, Inhale 2 puffs into the lungs 2 (two) times daily., Disp: 1 Inhaler, Rfl: 12 .  citalopram (CELEXA) 20 MG tablet, Take 1 tablet (20 mg total) by mouth daily., Disp: 30 tablet, Rfl: 0 .  fluticasone (FLONASE) 50 MCG/ACT nasal spray, Place into the nose., Disp: , Rfl:  .  lisinopril-hydrochlorothiazide (PRINZIDE,ZESTORETIC) 20-25 MG tablet, Take 1 tablet by mouth daily., Disp: 30 tablet, Rfl: 0 .  metoprolol succinate (TOPROL-XL) 25 MG 24 hr tablet, Take 1 tablet (25 mg total) by mouth daily., Disp: 30 tablet, Rfl: 0 .  omeprazole (PRILOSEC) 20 MG capsule, Take 1 capsule (20 mg total) by mouth daily., Disp: 30 capsule, Rfl: 0  Allergies  Allergen Reactions  . Levofloxacin Shortness Of Breath  . Penicillins Hives    itching     Review of Systems  Constitutional: Negative for fever and chills.  HENT: Negative for sore throat.   Eyes: Negative for blurred vision and double vision.  Respiratory: Negative for cough, hemoptysis, sputum production and shortness of breath.   Cardiovascular: Negative for chest pain and palpitations.  Gastrointestinal: Negative for heartburn, dysphagia and nausea.  Neurological: Negative for headaches.  Psychiatric/Behavioral: Negative for depression. The patient is not nervous/anxious and does not have  insomnia.     Objective  Filed Vitals:   05/12/15 1517  BP: 140/72  Pulse: 60  Temp: 98 F (36.7 C)  TempSrc: Oral  Resp: 16  Height: 5' (1.524 m)  Weight: 124 lb 6.4 oz (56.427 kg)  SpO2: 97%    Physical Exam  Constitutional: She is oriented to person, place, and time and well-developed, well-nourished, and in no distress.  HENT:  Head: Normocephalic and atraumatic.  Mouth/Throat: Oropharynx is clear and moist and mucous membranes are normal.  Cardiovascular: Normal rate, regular rhythm and normal heart sounds.   No murmur heard. Pulmonary/Chest: Effort normal and breath sounds normal. She has no wheezes.  Abdominal: Soft. Bowel sounds are normal. There is no tenderness.  Neurological: She is alert and oriented to person, place, and time.  Psychiatric: Mood, memory, affect and judgment normal.  Nursing note and vitals reviewed.    Assessment & Plan  1. Need for immunization against influenza  - Flu Vaccine QUAD 36+ mos PF IM (Fluarix & Fluzone Quad PF)  2. Essential hypertension BP stable and controlled on present therapy. - metoprolol succinate (TOPROL-XL) 25 MG 24 hr tablet; Take 1 tablet (25 mg total) by mouth daily.  Dispense: 90 tablet; Refill: 0 - lisinopril-hydrochlorothiazide (PRINZIDE,ZESTORETIC) 20-25 MG tablet; Take 1 tablet by mouth daily.  Dispense: 90 tablet; Refill: 0 - Comprehensive Metabolic Panel (CMET)  3. Allergic rhinitis, seasonal  - fluticasone (FLONASE) 50 MCG/ACT nasal spray; Place 2 sprays into both nostrils daily.  Dispense: 16 g; Refill: 0  4. Chronic bronchitis, unspecified chronic bronchitis type (HCC)  - albuterol (PROAIR HFA) 108 (90 BASE) MCG/ACT inhaler; Inhale 2 puffs into the lungs every 6 (six) hours as needed for wheezing or shortness of breath.  Dispense: 18 g; Refill: 2 - budesonide-formoterol (SYMBICORT) 160-4.5 MCG/ACT inhaler; Inhale 2 puffs into the lungs 2 (two) times daily.  Dispense: 1 Inhaler; Refill: 2  5.  Gastroesophageal reflux disease, esophagitis presence not specified  - omeprazole (PRILOSEC) 20 MG capsule; Take 1 capsule (20 mg total) by mouth daily.  Dispense: 90 capsule; Refill: 0  6. Depression  - citalopram (CELEXA) 20 MG tablet; Take 1 tablet (20 mg total) by mouth daily.  Dispense: 90 tablet; Refill: 0    Terri Burns A. [facility] 05/12/2015 4:00 PM

## 2015-08-10 ENCOUNTER — Telehealth: Payer: Self-pay

## 2015-08-10 DIAGNOSIS — I10 Essential (primary) hypertension: Secondary | ICD-10-CM

## 2015-08-10 MED ORDER — METOPROLOL SUCCINATE ER 25 MG PO TB24: 25.0000 mg | ORAL_TABLET | Freq: Every day | ORAL | Status: DC

## 2015-08-10 MED ORDER — LISINOPRIL-HYDROCHLOROTHIAZIDE 20-25 MG PO TABS: 1.0000 | ORAL_TABLET | Freq: Every day | ORAL | Status: DC

## 2015-08-10 NOTE — Telephone Encounter (Signed)
Medication has been refilled and sent to [facility] [address] 

## 2015-08-11 ENCOUNTER — Ambulatory Visit: Payer: Medicare Other | Admitting: Family Medicine

## 2015-08-20 ENCOUNTER — Ambulatory Visit (INDEPENDENT_AMBULATORY_CARE_PROVIDER_SITE_OTHER): Payer: Medicare Other | Admitting: Family Medicine

## 2015-08-20 ENCOUNTER — Encounter: Payer: Self-pay | Admitting: Family Medicine

## 2015-08-20 VITALS — BP 138/70 | HR 60 | Temp 98.7°F | Resp 15 | Ht 60.0 in | Wt 126.0 lb

## 2015-08-20 DIAGNOSIS — F329 Major depressive disorder, single episode, unspecified: Secondary | ICD-10-CM

## 2015-08-20 DIAGNOSIS — K219 Gastro-esophageal reflux disease without esophagitis: Secondary | ICD-10-CM

## 2015-08-20 DIAGNOSIS — F32A Depression, unspecified: Secondary | ICD-10-CM

## 2015-08-20 DIAGNOSIS — I10 Essential (primary) hypertension: Secondary | ICD-10-CM | POA: Diagnosis not present

## 2015-08-20 DIAGNOSIS — Z720 Tobacco use: Secondary | ICD-10-CM

## 2015-08-20 MED ORDER — LISINOPRIL-HYDROCHLOROTHIAZIDE 20-25 MG PO TABS: 1.0000 | ORAL_TABLET | Freq: Every day | ORAL | Status: DC

## 2015-08-20 MED ORDER — OMEPRAZOLE 20 MG PO CPDR: 20.0000 mg | DELAYED_RELEASE_CAPSULE | Freq: Every day | ORAL | Status: DC

## 2015-08-20 MED ORDER — METOPROLOL SUCCINATE ER 25 MG PO TB24: 25.0000 mg | ORAL_TABLET | Freq: Every day | ORAL | Status: DC

## 2015-08-20 MED ORDER — CITALOPRAM HYDROBROMIDE 20 MG PO TABS: 20.0000 mg | ORAL_TABLET | Freq: Every day | ORAL | Status: DC

## 2015-08-20 NOTE — Progress Notes (Signed)
Name: Terri Burns   MRN: [id number]    DOB: [birth year]   Date:08/20/2015       Progress Note  Subjective  Chief Complaint  Chief Complaint  Patient presents with  . Follow-up    3 mo  . Medication Refill    Hypertension This is a chronic problem. The problem is controlled. Pertinent negatives include no chest pain, headaches, palpitations or shortness of breath. Past treatments include ACE inhibitors and diuretics. There is no history of kidney disease, CAD/MI or CVA.  Depression        This is a chronic problem.The problem is unchanged.  Associated symptoms include hopelessness and sad.  Associated symptoms include no headaches.  Past treatments include SSRIs - Selective serotonin reuptake inhibitors.  Compliance with treatment is good. Gastroesophageal Reflux She reports no abdominal pain, no chest pain, no dysphagia, no heartburn, no nausea or no sore throat. This is a chronic problem. She has tried a PPI for the symptoms.  Nicotine Dependence Presents for initial visit. Symptoms are negative for sore throat. Preferred tobacco types include cigarettes. Her urge triggers include company of smokers. The symptoms have been stable. She smokes 1 pack of cigarettes per day. Past treatments include nothing. Terri Burns is not interested in quitting. Terri Burns has tried to quit 1 time.     Past Medical History  Diagnosis Date  . Hypertension   . COPD (chronic obstructive pulmonary disease) ([place])     History reviewed. No pertinent past surgical history.  History reviewed. No pertinent family history.  Social History   Social History  . Marital Status: Widowed    Spouse Name: N/A  . Number of Children: N/A  . Years of Education: N/A   Occupational History  . Not on file.   Social History Main Topics  . Smoking status: Current Every Day Smoker -- 1.00 packs/day    Types: Cigarettes  . Smokeless tobacco: Never Used  . Alcohol Use: No  . Drug Use: No  . Sexual Activity: No   Other  Topics Concern  . Not on file   Social History Narrative     Current outpatient prescriptions:  .  albuterol (PROAIR HFA) 108 (90 BASE) MCG/ACT inhaler, Inhale 2 puffs into the lungs every 6 (six) hours as needed for wheezing or shortness of breath., Disp: 18 g, Rfl: 2 .  budesonide-formoterol (SYMBICORT) 160-4.5 MCG/ACT inhaler, Inhale 2 puffs into the lungs 2 (two) times daily., Disp: 1 Inhaler, Rfl: 2 .  citalopram (CELEXA) 20 MG tablet, Take 1 tablet (20 mg total) by mouth daily., Disp: 90 tablet, Rfl: 0 .  fluticasone (FLONASE) 50 MCG/ACT nasal spray, Place 2 sprays into both nostrils daily., Disp: 16 g, Rfl: 0 .  lisinopril-hydrochlorothiazide (PRINZIDE,ZESTORETIC) 20-25 MG tablet, Take 1 tablet by mouth daily., Disp: 30 tablet, Rfl: 0 .  metoprolol succinate (TOPROL-XL) 25 MG 24 hr tablet, Take 1 tablet (25 mg total) by mouth daily., Disp: 30 tablet, Rfl: 0 .  omeprazole (PRILOSEC) 20 MG capsule, Take 1 capsule (20 mg total) by mouth daily., Disp: 90 capsule, Rfl: 0  Allergies  Allergen Reactions  . Levofloxacin Shortness Of Breath  . Penicillins Hives    itching     Review of Systems  HENT: Negative for sore throat.   Respiratory: Negative for shortness of breath.   Cardiovascular: Negative for chest pain and palpitations.  Gastrointestinal: Negative for heartburn, dysphagia, nausea and abdominal pain.  Neurological: Negative for headaches.  Psychiatric/Behavioral: Positive for depression.  Objective  Filed Vitals:   08/20/15 1037  BP: 138/70  Pulse: 60  Temp: 98.7 F (37.1 C)  TempSrc: Oral  Resp: 15  Height: 5' (1.524 m)  Weight: 125 lb 15.4 oz (57.135 kg)  SpO2: 99%    Physical Exam  Constitutional: She is oriented to person, place, and time and well-developed, well-nourished, and in no distress.  HENT:  Head: Normocephalic and atraumatic.  Mouth/Throat: Oropharynx is clear and moist and mucous membranes are normal.  Cardiovascular: Normal rate,  regular rhythm and normal heart sounds.   No murmur heard. Pulmonary/Chest: Effort normal and breath sounds normal. She has no wheezes.  Abdominal: Soft. Bowel sounds are normal. There is no tenderness.  Neurological: She is alert and oriented to person, place, and time.  Psychiatric: Mood, memory, affect and judgment normal.  Nursing note and vitals reviewed.      Assessment & Plan  1. Depression Stable and responsive to SSRI therapy. - citalopram (CELEXA) 20 MG tablet; Take 1 tablet (20 mg total) by mouth daily.  Dispense: 90 tablet; Refill: 0  2. Essential hypertension BP well controlled. - lisinopril-hydrochlorothiazide (PRINZIDE,ZESTORETIC) 20-25 MG tablet; Take 1 tablet by mouth daily.  Dispense: 90 tablet; Refill: 0 - metoprolol succinate (TOPROL-XL) 25 MG 24 hr tablet; Take 1 tablet (25 mg total) by mouth daily.  Dispense: 90 tablet; Refill: 0  3. Gastroesophageal reflux disease, esophagitis presence not specified  - omeprazole (PRILOSEC) 20 MG capsule; Take 1 capsule (20 mg total) by mouth daily.  Dispense: 90 capsule; Refill: 0  4. Tobacco abuse Patient is not ready to quit at this time. We will contact us when she is ready.    Terri Burns Asad A. [facility] 08/20/2015 10:50 AM

## 2015-09-03 ENCOUNTER — Other Ambulatory Visit: Payer: Self-pay | Admitting: Family Medicine

## 2015-09-15 ENCOUNTER — Other Ambulatory Visit: Payer: Self-pay | Admitting: Family Medicine

## 2015-10-03 ENCOUNTER — Other Ambulatory Visit: Payer: Self-pay | Admitting: Family Medicine

## 2015-10-13 ENCOUNTER — Other Ambulatory Visit: Payer: Self-pay | Admitting: Family Medicine

## 2015-10-13 DIAGNOSIS — I10 Essential (primary) hypertension: Secondary | ICD-10-CM

## 2015-10-19 ENCOUNTER — Telehealth: Payer: Self-pay | Admitting: Family Medicine

## 2015-10-19 DIAGNOSIS — F329 Major depressive disorder, single episode, unspecified: Secondary | ICD-10-CM

## 2015-10-19 DIAGNOSIS — F32A Depression, unspecified: Secondary | ICD-10-CM

## 2015-10-19 MED ORDER — CITALOPRAM HYDROBROMIDE 20 MG PO TABS: 20.0000 mg | ORAL_TABLET | Freq: Every day | ORAL | Status: DC

## 2015-10-19 NOTE — Telephone Encounter (Signed)
Pt needs refill on Citalopram, Motoperol and Lisinopril to be sent to [facility] [address]

## 2015-10-19 NOTE — Addendum Note (Signed)
Addended byManuella Ghazi, Erle Guster A A on: 10/19/2015 07:42 PM   Modules accepted: Orders

## 2015-10-19 NOTE — Telephone Encounter (Signed)
Pt states she called the pharmacy last week and was told they sent her refill request here but pt has not heard anything.

## 2015-10-19 NOTE — Telephone Encounter (Signed)
For prescription refills, patient is supposed to call the pharmacy, who is then supposed to send [place] an electronic refill request.

## 2015-10-25 ENCOUNTER — Telehealth: Payer: Self-pay | Admitting: Family Medicine

## 2015-10-25 DIAGNOSIS — I10 Essential (primary) hypertension: Secondary | ICD-10-CM

## 2015-10-25 NOTE — Telephone Encounter (Signed)
Refill request received from [facility]; they are requesting provider send a 90 day supply of lisinopril/hctz to local pharmacy instead of 30 day supply

## 2015-10-26 MED ORDER — LISINOPRIL-HYDROCHLOROTHIAZIDE 20-25 MG PO TABS: 1.0000 | ORAL_TABLET | Freq: Every day | ORAL | Status: DC

## 2015-10-26 NOTE — Telephone Encounter (Signed)
90 day supply of lisinopril-HCTZ sent to local pharmacy

## 2015-11-20 ENCOUNTER — Encounter: Payer: Self-pay | Admitting: Family Medicine

## 2015-11-20 ENCOUNTER — Ambulatory Visit (INDEPENDENT_AMBULATORY_CARE_PROVIDER_SITE_OTHER): Payer: Medicare Other | Admitting: Family Medicine

## 2015-11-20 VITALS — BP 114/78 | HR 62 | Temp 98.4°F | Resp 15 | Ht 60.0 in | Wt 121.1 lb

## 2015-11-20 DIAGNOSIS — K219 Gastro-esophageal reflux disease without esophagitis: Secondary | ICD-10-CM

## 2015-11-20 DIAGNOSIS — F32A Depression, unspecified: Secondary | ICD-10-CM

## 2015-11-20 DIAGNOSIS — J42 Unspecified chronic bronchitis: Secondary | ICD-10-CM | POA: Diagnosis not present

## 2015-11-20 DIAGNOSIS — F329 Major depressive disorder, single episode, unspecified: Secondary | ICD-10-CM

## 2015-11-20 DIAGNOSIS — I10 Essential (primary) hypertension: Secondary | ICD-10-CM

## 2015-11-20 LAB — COMPREHENSIVE METABOLIC PANEL
ALBUMIN: 3.6 g/dL (ref 3.6–5.1)
ALK PHOS: 55 U/L (ref 33–130)
ALT: 11 U/L (ref 6–29)
AST: 15 U/L (ref 10–35)
BILIRUBIN TOTAL: 0.3 mg/dL (ref 0.2–1.2)
BUN: 9 mg/dL (ref 7–25)
CALCIUM: 8.6 mg/dL (ref 8.6–10.4)
CO2: 26 mmol/L (ref 20–31)
Chloride: 99 mmol/L (ref 98–110)
Creat: 0.68 mg/dL (ref 0.50–0.99)
GLUCOSE: 86 mg/dL (ref 65–99)
POTASSIUM: 4.3 mmol/L (ref 3.5–5.3)
Sodium: 130 mmol/L — ABNORMAL LOW (ref 135–146)
Total Protein: 6.4 g/dL (ref 6.1–8.1)

## 2015-11-20 MED ORDER — ALBUTEROL SULFATE HFA 108 (90 BASE) MCG/ACT IN AERS: 2.0000 | INHALATION_SPRAY | Freq: Four times a day (QID) | RESPIRATORY_TRACT | Status: DC | PRN

## 2015-11-20 NOTE — Progress Notes (Signed)
Name: Terri Burns   MRN: [id number]    DOB: [birth year]   Date:11/20/2015       Progress Note  Subjective  Chief Complaint  Chief Complaint  Patient presents with  . Follow-up    3 mo  . Referral    mammogram    Hypertension This is a chronic problem. The problem is controlled. Pertinent negatives include no blurred vision, chest pain, headaches or palpitations. Past treatments include ACE inhibitors, diuretics and beta blockers. There is no history of CAD/MI or CVA.  Depression        This is a chronic problem.  The problem has been gradually improving since onset.  Associated symptoms include no fatigue, no hopelessness, no headaches and not sad.  Past treatments include SSRIs - Selective serotonin reuptake inhibitors.  Compliance with treatment is good. Gastroesophageal Reflux She reports no abdominal pain, no belching, no chest pain, no dysphagia or no heartburn. This is a chronic problem. The symptoms are aggravated by certain foods (whipped cream or sour cream makes it worse.). Pertinent negatives include no fatigue. She has tried a PPI for the symptoms.   COPD: Pt. Has long-standing history of COPD, symptoms include shortness of breath on exertion/with activity. No dyspnea at rest. In the morning, she experiences nasal and sinus congestion, and some coughing. She smokes 1 PPD, has smoked for 40 years. She takes Symbicort and is supposed to take Albuterol [employer]) but is not covered by her insurance.      Past Medical History  Diagnosis Date  . Hypertension   . COPD (chronic obstructive pulmonary disease) ([place])     History reviewed. No pertinent past surgical history.  History reviewed. No pertinent family history.  Social History   Social History  . Marital Status: Widowed    Spouse Name: N/A  . Number of Children: N/A  . Years of Education: N/A   Occupational History  . Not on file.   Social History Main Topics  . Smoking status: Current Every Day Smoker --  1.00 packs/day    Types: Cigarettes  . Smokeless tobacco: Never Used  . Alcohol Use: No  . Drug Use: No  . Sexual Activity: No   Other Topics Concern  . Not on file   Social History Narrative     Current outpatient prescriptions:  .  albuterol (PROAIR HFA) 108 (90 BASE) MCG/ACT inhaler, Inhale 2 puffs into the lungs every 6 (six) hours as needed for wheezing or shortness of breath., Disp: 18 g, Rfl: 2 .  budesonide-formoterol (SYMBICORT) 160-4.5 MCG/ACT inhaler, Inhale 2 puffs into the lungs 2 (two) times daily., Disp: 1 Inhaler, Rfl: 2 .  citalopram (CELEXA) 20 MG tablet, Take 1 tablet (20 mg total) by mouth daily., Disp: 90 tablet, Rfl: 0 .  fluticasone (FLONASE) 50 MCG/ACT nasal spray, Place 2 sprays into both nostrils daily., Disp: 16 g, Rfl: 0 .  lisinopril-hydrochlorothiazide (PRINZIDE,ZESTORETIC) 20-25 MG tablet, Take 1 tablet by mouth daily., Disp: 90 tablet, Rfl: 1 .  metoprolol succinate (TOPROL-XL) 25 MG 24 hr tablet, TAKE 1 TABLET (25 MG TOTAL) BY MOUTH DAILY., Disp: 90 tablet, Rfl: 1 .  omeprazole (PRILOSEC) 20 MG capsule, Take 1 capsule (20 mg total) by mouth daily., Disp: 90 capsule, Rfl: 0 .  omeprazole (PRILOSEC) 20 MG capsule, TAKE 1 CAPSULE (20 MG TOTAL) BY MOUTH DAILY., Disp: 90 capsule, Rfl: 0  Allergies  Allergen Reactions  . Levofloxacin Shortness Of Breath  . Penicillins Hives    itching  Review of Systems  Constitutional: Negative for fatigue.  Eyes: Negative for blurred vision.  Cardiovascular: Negative for chest pain and palpitations.  Gastrointestinal: Negative for heartburn, dysphagia and abdominal pain.  Neurological: Negative for headaches.  Psychiatric/Behavioral: Positive for depression.   Objective  Filed Vitals:   11/20/15 1019  BP: 114/78  Pulse: 62  Temp: 98.4 F (36.9 C)  TempSrc: Oral  Resp: 15  Height: 5' (1.524 m)  Weight: 121 lb 1.6 oz (54.931 kg)  SpO2: 95%    Physical Exam  Constitutional: She is oriented to  person, place, and time and well-developed, well-nourished, and in no distress.  HENT:  Head: Normocephalic and atraumatic.  Cardiovascular: Normal rate, regular rhythm and normal heart sounds.   No murmur heard. Pulmonary/Chest: Effort normal. She has wheezes.  Bilateral expiratory wheezing and coarse breath sounds.  Musculoskeletal:       Right ankle: She exhibits no swelling.       Left ankle: She exhibits no swelling.  Neurological: She is alert and oriented to person, place, and time.  Psychiatric: Mood, memory, affect and judgment normal.  Nursing note and vitals reviewed.    Assessment & Plan  1. Depression Stable on SSRI therapy.  2. Essential hypertension Blood pressure stable and controlled on present antihypertensive therapy - Comprehensive Metabolic Panel (CMET)  3. Gastroesophageal reflux disease, esophagitis presence not specified Symptoms improved and controlled on PPI therapy  4. Chronic bronchitis, unspecified chronic bronchitis type ([place]) We'll change Pro Air to Ventolin inhaler to be used as needed and up to 4 times daily. - albuterol (PROVENTIL HFA;VENTOLIN HFA) 108 (90 Base) MCG/ACT inhaler; Inhale 2 puffs into the lungs every 6 (six) hours as needed for wheezing or shortness of breath.  Dispense: 1 Inhaler; Refill: 2   Terri Burns Asad A. [facility] 11/20/2015 10:30 AM

## 2016-01-20 ENCOUNTER — Other Ambulatory Visit: Payer: Self-pay | Admitting: Family Medicine

## 2016-01-20 DIAGNOSIS — F32A Depression, unspecified: Secondary | ICD-10-CM

## 2016-01-20 DIAGNOSIS — F329 Major depressive disorder, single episode, unspecified: Secondary | ICD-10-CM

## 2016-01-20 DIAGNOSIS — K219 Gastro-esophageal reflux disease without esophagitis: Secondary | ICD-10-CM

## 2016-02-29 ENCOUNTER — Ambulatory Visit: Payer: Medicare Other | Admitting: Family Medicine

## 2016-03-17 ENCOUNTER — Other Ambulatory Visit: Payer: Self-pay | Admitting: Family Medicine

## 2016-03-17 DIAGNOSIS — J42 Unspecified chronic bronchitis: Secondary | ICD-10-CM

## 2016-04-16 ENCOUNTER — Other Ambulatory Visit: Payer: Self-pay | Admitting: Family Medicine

## 2016-04-16 DIAGNOSIS — I10 Essential (primary) hypertension: Secondary | ICD-10-CM

## 2016-04-28 ENCOUNTER — Other Ambulatory Visit: Payer: Self-pay | Admitting: Family Medicine

## 2016-04-28 DIAGNOSIS — F329 Major depressive disorder, single episode, unspecified: Secondary | ICD-10-CM

## 2016-04-28 DIAGNOSIS — F32A Depression, unspecified: Secondary | ICD-10-CM

## 2016-04-29 ENCOUNTER — Inpatient Hospital Stay
Admission: EM | Admit: 2016-04-29 | Discharge: 2016-05-02 | DRG: 190 | Disposition: A | Discharge status: Home / Self Care | Payer: Medicare Other | Attending: Internal Medicine | Admitting: Internal Medicine

## 2016-04-29 ENCOUNTER — Emergency Department: Payer: Medicare Other

## 2016-04-29 ENCOUNTER — Encounter: Payer: Self-pay | Admitting: Internal Medicine

## 2016-04-29 DIAGNOSIS — J209 Acute bronchitis, unspecified: Secondary | ICD-10-CM | POA: Diagnosis present

## 2016-04-29 DIAGNOSIS — R7989 Other specified abnormal findings of blood chemistry: Secondary | ICD-10-CM

## 2016-04-29 DIAGNOSIS — J441 Chronic obstructive pulmonary disease with (acute) exacerbation: Secondary | ICD-10-CM

## 2016-04-29 DIAGNOSIS — E876 Hypokalemia: Secondary | ICD-10-CM | POA: Diagnosis present

## 2016-04-29 DIAGNOSIS — F17210 Nicotine dependence, cigarettes, uncomplicated: Secondary | ICD-10-CM | POA: Diagnosis not present

## 2016-04-29 DIAGNOSIS — Z7951 Long term (current) use of inhaled steroids: Secondary | ICD-10-CM | POA: Diagnosis not present

## 2016-04-29 DIAGNOSIS — R0602 Shortness of breath: Secondary | ICD-10-CM | POA: Diagnosis not present

## 2016-04-29 DIAGNOSIS — J440 Chronic obstructive pulmonary disease with acute lower respiratory infection: Secondary | ICD-10-CM | POA: Diagnosis present

## 2016-04-29 DIAGNOSIS — Z8249 Family history of ischemic heart disease and other diseases of the circulatory system: Secondary | ICD-10-CM | POA: Diagnosis not present

## 2016-04-29 DIAGNOSIS — K210 Gastro-esophageal reflux disease with esophagitis: Secondary | ICD-10-CM | POA: Diagnosis present

## 2016-04-29 DIAGNOSIS — Z881 Allergy status to other antibiotic agents status: Secondary | ICD-10-CM | POA: Diagnosis not present

## 2016-04-29 DIAGNOSIS — Z880 Allergy status to penicillin: Secondary | ICD-10-CM

## 2016-04-29 DIAGNOSIS — I10 Essential (primary) hypertension: Secondary | ICD-10-CM | POA: Diagnosis present

## 2016-04-29 DIAGNOSIS — I248 Other forms of acute ischemic heart disease: Secondary | ICD-10-CM | POA: Diagnosis present

## 2016-04-29 DIAGNOSIS — J9601 Acute respiratory failure with hypoxia: Secondary | ICD-10-CM | POA: Diagnosis present

## 2016-04-29 DIAGNOSIS — R748 Abnormal levels of other serum enzymes: Secondary | ICD-10-CM | POA: Diagnosis not present

## 2016-04-29 DIAGNOSIS — R778 Other specified abnormalities of plasma proteins: Secondary | ICD-10-CM

## 2016-04-29 DIAGNOSIS — R05 Cough: Secondary | ICD-10-CM | POA: Diagnosis not present

## 2016-04-29 DIAGNOSIS — R0600 Dyspnea, unspecified: Secondary | ICD-10-CM | POA: Diagnosis not present

## 2016-04-29 DIAGNOSIS — Z716 Tobacco abuse counseling: Secondary | ICD-10-CM | POA: Diagnosis not present

## 2016-04-29 DIAGNOSIS — R0902 Hypoxemia: Secondary | ICD-10-CM | POA: Diagnosis not present

## 2016-04-29 LAB — BASIC METABOLIC PANEL
ANION GAP: 9 (ref 5–15)
BUN: 10 mg/dL (ref 6–20)
CALCIUM: 8.7 mg/dL — AB (ref 8.9–10.3)
CHLORIDE: 104 mmol/L (ref 101–111)
CO2: 25 mmol/L (ref 22–32)
CREATININE: 0.58 mg/dL (ref 0.44–1.00)
GFR calc non Af Amer: >60 mL/min (ref >60)
Glucose, Bld: 109 mg/dL — ABNORMAL HIGH (ref 65–99)
Potassium: 3.7 mmol/L (ref 3.5–5.1)
SODIUM: 138 mmol/L (ref 135–145)

## 2016-04-29 LAB — TROPONIN I
TROPONIN I: 0.09 ng/mL — AB (ref <0.03)
TROPONIN I: 0.07 ng/mL — AB (ref <0.03)

## 2016-04-29 LAB — CBC
HCT: 30.9 % — ABNORMAL LOW (ref 35.0–47.0)
Hemoglobin: 10 g/dL — ABNORMAL LOW (ref 12.0–16.0)
MCH: 25.2 pg — ABNORMAL LOW (ref 26.0–34.0)
MCHC: 32.4 g/dL (ref 32.0–36.0)
MCV: 77.9 fL — ABNORMAL LOW (ref 80.0–100.0)
PLATELETS: 294 10*3/uL (ref 150–440)
RBC: 3.97 MIL/uL (ref 3.80–5.20)
RDW: 18 % — ABNORMAL HIGH (ref 11.5–14.5)
WBC: 11.5 10*3/uL — AB (ref 3.6–11.0)

## 2016-04-29 MED ORDER — ASPIRIN 81 MG PO CHEW
324.0000 mg | CHEWABLE_TABLET | Freq: Once | ORAL | Status: AC
  Administered 2016-04-29: 324 mg via ORAL
  Filled 2016-04-29: qty 4

## 2016-04-29 MED ORDER — SODIUM CHLORIDE 0.9 % IV SOLN: 250.0000 mL | INTRAVENOUS | Status: DC | PRN

## 2016-04-29 MED ORDER — IPRATROPIUM-ALBUTEROL 0.5-2.5 (3) MG/3ML IN SOLN
3.0000 mL | Freq: Once | RESPIRATORY_TRACT | Status: AC
  Administered 2016-04-29: 3 mL via RESPIRATORY_TRACT
  Filled 2016-04-29: qty 3

## 2016-04-29 MED ORDER — CITALOPRAM HYDROBROMIDE 20 MG PO TABS
20.0000 mg | ORAL_TABLET | Freq: Every day | ORAL | Status: DC
  Administered 2016-04-29 – 2016-05-02 (×4): 20 mg via ORAL
  Filled 2016-04-29 (×4): qty 1

## 2016-04-29 MED ORDER — GUAIFENESIN 100 MG/5ML PO SOLN
5.0000 mL | ORAL | Status: DC | PRN
  Administered 2016-04-29: 100 mg via ORAL
  Filled 2016-04-29: qty 10

## 2016-04-29 MED ORDER — SODIUM CHLORIDE 0.9% FLUSH
3.0000 mL | Freq: Two times a day (BID) | INTRAVENOUS | Status: DC
  Administered 2016-04-29 – 2016-05-02 (×6): 3 mL via INTRAVENOUS

## 2016-04-29 MED ORDER — ALBUTEROL SULFATE HFA 108 (90 BASE) MCG/ACT IN AERS: 2.0000 | INHALATION_SPRAY | Freq: Four times a day (QID) | RESPIRATORY_TRACT | Status: DC | PRN

## 2016-04-29 MED ORDER — HEPARIN SODIUM (PORCINE) 5000 UNIT/ML IJ SOLN
5000.0000 [IU] | Freq: Three times a day (TID) | INTRAMUSCULAR | Status: DC
  Administered 2016-04-30 – 2016-05-02 (×6): 5000 [IU] via SUBCUTANEOUS
  Filled 2016-04-29 (×7): qty 1

## 2016-04-29 MED ORDER — LISINOPRIL 20 MG PO TABS
20.0000 mg | ORAL_TABLET | Freq: Every day | ORAL | Status: DC
  Administered 2016-04-29 – 2016-05-02 (×4): 20 mg via ORAL
  Filled 2016-04-29 (×4): qty 1

## 2016-04-29 MED ORDER — HYDROCHLOROTHIAZIDE 25 MG PO TABS
25.0000 mg | ORAL_TABLET | Freq: Every day | ORAL | Status: DC
  Administered 2016-04-29 – 2016-05-02 (×4): 25 mg via ORAL
  Filled 2016-04-29 (×4): qty 1

## 2016-04-29 MED ORDER — METHYLPREDNISOLONE SODIUM SUCC 125 MG IJ SOLR
60.0000 mg | Freq: Four times a day (QID) | INTRAMUSCULAR | Status: DC
  Administered 2016-04-29 – 2016-04-30 (×3): 60 mg via INTRAVENOUS
  Filled 2016-04-29 (×3): qty 2

## 2016-04-29 MED ORDER — METOPROLOL SUCCINATE ER 25 MG PO TB24
25.0000 mg | ORAL_TABLET | Freq: Every day | ORAL | Status: DC
  Administered 2016-04-29 – 2016-05-02 (×4): 25 mg via ORAL
  Filled 2016-04-29 (×4): qty 1

## 2016-04-29 MED ORDER — MOMETASONE FURO-FORMOTEROL FUM 200-5 MCG/ACT IN AERO
2.0000 | INHALATION_SPRAY | Freq: Two times a day (BID) | RESPIRATORY_TRACT | Status: DC
  Administered 2016-04-29 – 2016-05-02 (×6): 2 via RESPIRATORY_TRACT
  Filled 2016-04-29: qty 8.8

## 2016-04-29 MED ORDER — ALBUTEROL SULFATE (2.5 MG/3ML) 0.083% IN NEBU: 2.5000 mg | INHALATION_SOLUTION | RESPIRATORY_TRACT | Status: DC | PRN

## 2016-04-29 MED ORDER — PANTOPRAZOLE SODIUM 40 MG PO TBEC
40.0000 mg | DELAYED_RELEASE_TABLET | Freq: Every day | ORAL | Status: DC
  Administered 2016-04-29 – 2016-05-02 (×4): 40 mg via ORAL
  Filled 2016-04-29 (×4): qty 1

## 2016-04-29 MED ORDER — ACETAMINOPHEN 650 MG RE SUPP: 650.0000 mg | Freq: Four times a day (QID) | RECTAL | Status: DC | PRN

## 2016-04-29 MED ORDER — DEXTROSE 5 % IV SOLN
500.0000 mg | Freq: Once | INTRAVENOUS | Status: AC
  Administered 2016-04-29: 500 mg via INTRAVENOUS
  Filled 2016-04-29: qty 500

## 2016-04-29 MED ORDER — NICOTINE 14 MG/24HR TD PT24
14.0000 mg | MEDICATED_PATCH | Freq: Every day | TRANSDERMAL | Status: DC
  Filled 2016-04-29 (×3): qty 1

## 2016-04-29 MED ORDER — ASPIRIN 325 MG PO TABS
325.0000 mg | ORAL_TABLET | Freq: Every day | ORAL | Status: DC
  Administered 2016-04-30 – 2016-05-02 (×3): 325 mg via ORAL
  Filled 2016-04-29 (×3): qty 1

## 2016-04-29 MED ORDER — LISINOPRIL-HYDROCHLOROTHIAZIDE 20-25 MG PO TABS: 1.0000 | ORAL_TABLET | Freq: Every day | ORAL | Status: DC

## 2016-04-29 MED ORDER — SODIUM CHLORIDE 0.9% FLUSH
3.0000 mL | INTRAVENOUS | Status: DC | PRN
  Administered 2016-04-30: 3 mL via INTRAVENOUS
  Filled 2016-04-29: qty 3

## 2016-04-29 MED ORDER — ONDANSETRON HCL 4 MG/2ML IJ SOLN: 4.0000 mg | Freq: Four times a day (QID) | INTRAMUSCULAR | Status: DC | PRN

## 2016-04-29 MED ORDER — IPRATROPIUM-ALBUTEROL 0.5-2.5 (3) MG/3ML IN SOLN
3.0000 mL | Freq: Four times a day (QID) | RESPIRATORY_TRACT | Status: DC
  Administered 2016-04-30 – 2016-05-02 (×10): 3 mL via RESPIRATORY_TRACT
  Filled 2016-04-29 (×11): qty 3

## 2016-04-29 MED ORDER — DEXTROSE 5 % IV SOLN
500.0000 mg | INTRAVENOUS | Status: DC
  Filled 2016-04-29: qty 500

## 2016-04-29 MED ORDER — ONDANSETRON HCL 4 MG PO TABS: 4.0000 mg | ORAL_TABLET | Freq: Four times a day (QID) | ORAL | Status: DC | PRN

## 2016-04-29 MED ORDER — ACETAMINOPHEN 325 MG PO TABS
650.0000 mg | ORAL_TABLET | Freq: Four times a day (QID) | ORAL | Status: DC | PRN
  Administered 2016-05-01 – 2016-05-02 (×2): 650 mg via ORAL
  Filled 2016-04-29 (×2): qty 2

## 2016-04-29 NOTE — H&P (Addendum)
[facility] [place] NAME: Terri Burns    MR#:  [id number]  DATE OF BIRTH:  Aug 20, 1950  DATE OF ADMISSION:  04/29/2016  PRIMARY CARE PHYSICIAN: Keith Rake, MD   REQUESTING/REFERRING PHYSICIAN: Nance Pear, MD  CHIEF COMPLAINT:   Chief Complaint  Patient presents with  . Respiratory Distress  . COPD   Cough, wheezing and shortness of breath for 2 days HISTORY OF PRESENT ILLNESS:  Terri Burns  is a 65 y.o. female with a known history of COPD and hypertension. She has had shortness breath, wheezing and cough for the past 2 days. She denies any fever or chills but has a generalized weakness. She was found hypoxia at mid 80s on arrival. Chest x-ray didn't show any pneumonia or edema. She was treated with nebulizer treatment without improvement.  PAST MEDICAL HISTORY:   Past Medical History:  Diagnosis Date  . COPD (chronic obstructive pulmonary disease) ([place])   . Hypertension     PAST SURGICAL HISTORY:   No past surgical history on file.  SOCIAL HISTORY:   Social History  Substance Use Topics  . Smoking status: Current Every Day Smoker    Packs/day: 1.00    Types: Cigarettes  . Smokeless tobacco: Never Used  . Alcohol use No    FAMILY HISTORY:   Family History  Problem Relation Age of Onset  . Hypertension Mother     DRUG ALLERGIES:   Allergies  Allergen Reactions  . Levofloxacin Shortness Of Breath  . Penicillins Hives    itching    REVIEW OF SYSTEMS:   Review of Systems  Constitutional: Positive for malaise/fatigue. Negative for chills and fever.  HENT: Negative for congestion.   Eyes: Negative for blurred vision and double vision.  Respiratory: Positive for cough, sputum production, shortness of breath and wheezing. Negative for hemoptysis and stridor.   Cardiovascular: Negative for chest pain and leg swelling.  Gastrointestinal: Negative for abdominal pain, blood in stool, diarrhea, melena, nausea  and vomiting.  Genitourinary: Negative for dysuria and hematuria.  Musculoskeletal: Negative for back pain and joint pain.  Skin: Negative for itching and rash.  Neurological: Negative for dizziness, focal weakness and loss of consciousness.  Psychiatric/Behavioral: Negative for depression. The patient is not nervous/anxious.     MEDICATIONS AT HOME:   Prior to Admission medications   Medication Sig Start Date End Date Taking? Authorizing Provider  budesonide-formoterol (SYMBICORT) 160-4.5 MCG/ACT inhaler Inhale 2 puffs into the lungs 2 (two) times daily. 05/12/15  Yes Roselee Nova, MD  citalopram (CELEXA) 20 MG tablet TAKE 1 TABLET (20 MG TOTAL) BY MOUTH DAILY. 01/21/16  Yes Roselee Nova, MD  lisinopril-hydrochlorothiazide (PRINZIDE,ZESTORETIC) 20-25 MG tablet Take 1 tablet by mouth daily. 10/26/15  Yes Roselee Nova, MD  metoprolol succinate (TOPROL-XL) 25 MG 24 hr tablet TAKE 1 TABLET (25 MG TOTAL) BY MOUTH DAILY. 04/18/16  Yes Roselee Nova, MD  omeprazole (PRILOSEC) 20 MG capsule TAKE 1 CAPSULE (20 MG TOTAL) BY MOUTH DAILY. 01/21/16  Yes Roselee Nova, MD  VENTOLIN HFA 108 (90 Base) MCG/ACT inhaler INHALE 2 PUFFS INTO THE LUNGS EVERY 6 (SIX) HOURS AS NEEDED FOR WHEEZING OR SHORTNESS OF BREATH. 03/17/16   Roselee Nova, MD      VITAL SIGNS:  Blood pressure (!) 149/93, pulse 78, temperature 98.3 F (36.8 C), temperature source Oral, resp. rate (!) 24, height 5' (1.524 m), weight 108 lb (49  kg), SpO2 99 %.  PHYSICAL EXAMINATION:  Physical Exam  GENERAL:  65 y.o.-year-old patient lying in the bed with no acute distress.  Thin. EYES: Pupils equal, round, reactive to light and accommodation. No scleral icterus. Extraocular muscles intact.  HEENT: Head atraumatic, normocephalic. Oropharynx and nasopharynx clear.  NECK:  Supple, no jugular venous distention. No thyroid enlargement, no tenderness.  LUNGS: Normal breath sounds bilaterally, Moderate expiratory wheezing, no  rales,rhonchi or crepitation. No use of accessory muscles of respiration.  CARDIOVASCULAR: S1, S2 normal. No murmurs, rubs, or gallops.  ABDOMEN: Soft, nontender, nondistended. Bowel sounds present. No organomegaly or mass.  EXTREMITIES: No pedal edema, cyanosis, or clubbing.  NEUROLOGIC: Cranial nerves II through XII are intact. Muscle strength 5/5 in all extremities. Sensation intact. Gait not checked.  PSYCHIATRIC: The patient is alert and oriented x 3.  SKIN: No obvious rash, lesion, or ulcer.   LABORATORY PANEL:   CBC  Recent Labs Lab 04/29/16 1750  WBC 11.5*  HGB 10.0*  HCT 30.9*  PLT 294   ------------------------------------------------------------------------------------------------------------------  Chemistries   Recent Labs Lab 04/29/16 1750  NA 138  K 3.7  CL 104  CO2 25  GLUCOSE 109*  BUN 10  CREATININE 0.58  CALCIUM 8.7*   ------------------------------------------------------------------------------------------------------------------  Cardiac Enzymes  Recent Labs Lab 04/29/16 1750  TROPONINI 0.09*   ------------------------------------------------------------------------------------------------------------------  RADIOLOGY:  Dg Chest 2 View  Result Date: 04/29/2016 CLINICAL DATA:  Shortness of breath, productive cough. EXAM: CHEST  2 VIEW COMPARISON:  Radiographs of April 15, 2014. FINDINGS: The heart size and mediastinal contours are within normal limits. Stable scarring is noted in left upper lobe in both lung bases. No acute pulmonary disease is noted. No pneumothorax is noted. The visualized skeletal structures are unremarkable. IMPRESSION: Stable bilateral scarring. No acute cardiopulmonary abnormality seen. Electronically Signed   By: Marijo Conception, M.D.   On: 04/29/2016 18:37      IMPRESSION AND PLAN:   COPD exacerbation with hypoxia The patient will be admitted to medical floor. I will start IV Solu-Medrol, DuoNeb every 4 hours  and dulera. Try to wean off oxygen [place].  Elevated troponin. Possible due to demanding ischemia. Follow-up troponin level, continue aspirin.  Hypertension. Continue home hypertension medication. Tobacco abuse. Smoking cessation was counseled for 3-4 minutes, given nicotine patch.  All the records are reviewed and case discussed with ED provider. Management plans discussed with the patient, her son and they are in agreement.  CODE STATUS: Full code  TOTAL TIME TAKING CARE OF THIS PATIENT: 56 minutes.    Demetrios Loll M.D on 04/29/2016 at 8:44 PM  Between 7am to 6pm - Pager - [phone]  After 6pm go to [email or web address] - Proofreader  [facility]  Office  [phone]  CC: Primary care physician; Keith Rake, MD   Note: This dictation was prepared with Dragon dictation along with smaller phrase technology. Any transcriptional errors that result from this process are unintentional.

## 2016-04-29 NOTE — ED Provider Notes (Signed)
[facility] Emergency Department Provider Note  ____________________________________________   I have reviewed the triage vital signs and the nursing notes.   HISTORY  Chief Complaint Respiratory Distress and COPD   History limited by: Not Limited   HPI Terri Burns is a 65 y.o. female with history of COPD who presents to the emergency department today because of shortness of breath. The shortness of breath started yesterday. It has progressively gotten worse. Patient states that she is out of many of her COPD medications includes in her Ventolin and nebulizer medications. She has had some chest tightness with this and been coughing with some clear phlegm. Denies any fevers. States that she did have a sick family member recently. She denies any fevers.   Past Medical History:  Diagnosis Date  . COPD (chronic obstructive pulmonary disease) ([place])   . Hypertension     Patient Active Problem List   Diagnosis Date Noted  . Tobacco abuse 08/20/2015  . Allergic rhinitis, seasonal 05/12/2015  . Chronic obstructive pulmonary disease ([place]) 05/12/2015  . Degeneration of intervertebral disc of cervical region 05/12/2015  . Acid reflux 05/12/2015  . BP (high blood pressure) 05/12/2015  . Depression 05/12/2015    No past surgical history on file.  Prior to Admission medications   Medication Sig Start Date End Date Taking? Authorizing Provider  budesonide-formoterol (SYMBICORT) 160-4.5 MCG/ACT inhaler Inhale 2 puffs into the lungs 2 (two) times daily. 05/12/15   Roselee Nova, MD  citalopram (CELEXA) 20 MG tablet TAKE 1 TABLET (20 MG TOTAL) BY MOUTH DAILY. 01/21/16   Roselee Nova, MD  fluticasone (FLONASE) 50 MCG/ACT nasal spray Place 2 sprays into both nostrils daily. 05/12/15   Roselee Nova, MD  lisinopril-hydrochlorothiazide (PRINZIDE,ZESTORETIC) 20-25 MG tablet Take 1 tablet by mouth daily. 10/26/15   Roselee Nova, MD  metoprolol succinate  (TOPROL-XL) 25 MG 24 hr tablet TAKE 1 TABLET (25 MG TOTAL) BY MOUTH DAILY. 04/18/16   Roselee Nova, MD  omeprazole (PRILOSEC) 20 MG capsule TAKE 1 CAPSULE (20 MG TOTAL) BY MOUTH DAILY. 01/21/16   Roselee Nova, MD  VENTOLIN HFA 108 (90 Base) MCG/ACT inhaler INHALE 2 PUFFS INTO THE LUNGS EVERY 6 (SIX) HOURS AS NEEDED FOR WHEEZING OR SHORTNESS OF BREATH. 03/17/16   Roselee Nova, MD    Allergies Levofloxacin and Penicillins  No family history on file.  Social History Social History  Substance Use Topics  . Smoking status: Current Every Day Smoker    Packs/day: 1.00    Types: Cigarettes  . Smokeless tobacco: Never Used  . Alcohol use No    Review of Systems  Constitutional: Negative for fever. Cardiovascular: Negative for chest pain. Respiratory: Positive for shortness of breath. Gastrointestinal: Negative for abdominal pain, vomiting and diarrhea. Neurological: Negative for headaches, focal weakness or numbness.  10-point ROS otherwise negative.  ____________________________________________   PHYSICAL EXAM:  VITAL SIGNS: ED Triage Vitals  Enc Vitals Group     BP 04/29/16 1745 (!) 181/89     Pulse Rate 04/29/16 1745 78     Resp 04/29/16 1745 (!) 25     Temp 04/29/16 1745 98.2 F (36.8 C)     Temp Source 04/29/16 1745 Oral     SpO2 04/29/16 1745 97 %     Weight 04/29/16 1746 108 lb (49 kg)     Height 04/29/16 1746 5' (1.524 m)   Constitutional: Alert and oriented. Mild respiratory  distress.  Eyes: Conjunctivae are normal. Normal extraocular movements. ENT   Head: Normocephalic and atraumatic.   Nose: No congestion/rhinnorhea.   Mouth/Throat: Mucous membranes are moist.   Neck: No stridor. Hematological/Lymphatic/Immunilogical: No cervical lymphadenopathy. Cardiovascular: Normal rate, regular rhythm.  No murmurs, rubs, or gallops.  Respiratory: Diffuse bilateral wheezing. Mild respiratory distress.  Gastrointestinal: Soft and nontender. No  distention.  Genitourinary: Deferred Musculoskeletal: Normal range of motion in all extremities. No lower extremity edema. Neurologic:  Normal speech and language. No gross focal neurologic deficits are appreciated.  Skin:  Skin is warm, dry and intact. No rash noted. Psychiatric: Mood and affect are normal. Speech and behavior are normal. Patient exhibits appropriate insight and judgment.  ____________________________________________    LABS (pertinent positives/negatives)  Labs Reviewed  BASIC METABOLIC PANEL - Abnormal; Notable for the following:       Result Value   Glucose, Bld 109 (*)    Calcium 8.7 (*)    All other components within normal limits  CBC - Abnormal; Notable for the following:    WBC 11.5 (*)    Hemoglobin 10.0 (*)    HCT 30.9 (*)    MCV 77.9 (*)    MCH 25.2 (*)    RDW 18.0 (*)    All other components within normal limits  TROPONIN I - Abnormal; Notable for the following:    Troponin I 0.09 (*)    All other components within normal limits     ____________________________________________   EKG  I, Nance Pear, attending physician, personally viewed and interpreted this EKG  EKG Time: 1747 Rate: 78 Rhythm: normal sinus rhythm Axis: normal Intervals: qtc 482 QRS: narrow, q waves II, III, aVF ST changes: no st elevation Impression: abnormal ekg   ____________________________________________    RADIOLOGY  CXR IMPRESSION:  Stable bilateral scarring. No acute cardiopulmonary abnormality  seen.   I, Yobana Culliton, personally viewed and evaluated these images (plain radiographs) as part of my medical decision making, as well as reviewing the written report by the radiologist.  ____________________________________________   PROCEDURES  Procedures  CRITICAL CARE Performed by: Nance Pear   Total critical care time: 30 minutes  Critical care time was exclusive of separately billable procedures and treating other  patients.  Critical care was necessary to treat or prevent imminent or life-threatening deterioration.  Critical care was time spent personally by me on the following activities: development of treatment plan with patient and/or surrogate as well as nursing, discussions with consultants, evaluation of patient's response to treatment, examination of patient, obtaining history from patient or surrogate, ordering and performing treatments and interventions, ordering and review of laboratory studies, ordering and review of radiographic studies, pulse oximetry and re-evaluation of patient's condition.  ____________________________________________   INITIAL IMPRESSION / ASSESSMENT AND PLAN / ED COURSE  Pertinent labs & imaging results that were available during my care of the patient were reviewed by me and considered in my medical decision making (see chart for details).  Patient with history of COPD who presents to the emergency room today via EMS because of concerns for shortness breath. She was in the mid 64s from EMS when they arrived. She did receive slight Medrol and DuoNeb prior to my evaluation. However she still did have mild respiratory distress with bilateral wheezing. She was on nasal cannula. Will check chest x-ray to evaluate for pneumonia or edema. Will check blood work including troponin.  Clinical Course    Troponin is elevated. At this point I do wonder  if it is more demand ischemia than true ACS however will give aspirin. Will plan on admission to the hospital service for serial troponins as well as further management of COPD exacerbation. ____________________________________________   FINAL CLINICAL IMPRESSION(S) / ED DIAGNOSES  Final diagnoses:  COPD exacerbation ([place])  Elevated troponin     Note: This dictation was prepared with Dragon dictation. Any transcriptional errors that result from this process are unintentional    Nance Pear, MD 04/29/16 [phone]

## 2016-04-29 NOTE — ED Triage Notes (Addendum)
Pt presents to ED from home with son c/o SOB , O2 sats 82-87% per ems on arrival. Pt received 2 duonebs and 125 of solummedrol. Pt with known hx of COPD

## 2016-04-29 NOTE — ED Notes (Signed)
Notify son when pt has an admission bed/room  Assignment. [name] [phone]).

## 2016-04-29 NOTE — ED Notes (Signed)
Pt being transported to 1C Rm 103 at this time by [place], EDT.

## 2016-04-30 LAB — BASIC METABOLIC PANEL
Anion gap: 8 (ref 5–15)
BUN: 12 mg/dL (ref 6–20)
CALCIUM: 8.7 mg/dL — AB (ref 8.9–10.3)
CHLORIDE: 101 mmol/L (ref 101–111)
CO2: 27 mmol/L (ref 22–32)
CREATININE: 0.53 mg/dL (ref 0.44–1.00)
GFR calc Af Amer: >60 mL/min (ref >60)
GFR calc non Af Amer: >60 mL/min (ref >60)
Glucose, Bld: 142 mg/dL — ABNORMAL HIGH (ref 65–99)
Potassium: 3 mmol/L — ABNORMAL LOW (ref 3.5–5.1)
SODIUM: 136 mmol/L (ref 135–145)

## 2016-04-30 LAB — CBC
HCT: 30.6 % — ABNORMAL LOW (ref 35.0–47.0)
Hemoglobin: 10.3 g/dL — ABNORMAL LOW (ref 12.0–16.0)
MCH: 25.6 pg — AB (ref 26.0–34.0)
MCHC: 33.5 g/dL (ref 32.0–36.0)
MCV: 76.3 fL — AB (ref 80.0–100.0)
PLATELETS: 278 10*3/uL (ref 150–440)
RBC: 4.01 MIL/uL (ref 3.80–5.20)
RDW: 18.5 % — AB (ref 11.5–14.5)
WBC: 7.2 10*3/uL (ref 3.6–11.0)

## 2016-04-30 LAB — MAGNESIUM: MAGNESIUM: 1.8 mg/dL (ref 1.7–2.4)

## 2016-04-30 MED ORDER — ORAL CARE MOUTH RINSE
15.0000 mL | Freq: Two times a day (BID) | OROMUCOSAL | Status: DC
  Administered 2016-04-30 – 2016-05-01 (×3): 15 mL via OROMUCOSAL

## 2016-04-30 MED ORDER — POTASSIUM CHLORIDE CRYS ER 20 MEQ PO TBCR
40.0000 meq | EXTENDED_RELEASE_TABLET | Freq: Once | ORAL | Status: AC
  Administered 2016-04-30: 40 meq via ORAL
  Filled 2016-04-30: qty 2

## 2016-04-30 MED ORDER — HYDRALAZINE HCL 20 MG/ML IJ SOLN
10.0000 mg | Freq: Four times a day (QID) | INTRAMUSCULAR | Status: DC | PRN
  Administered 2016-04-30: 13:00:00 10 mg via INTRAVENOUS
  Filled 2016-04-30 (×2): qty 1

## 2016-04-30 MED ORDER — AZITHROMYCIN 500 MG PO TABS
500.0000 mg | ORAL_TABLET | Freq: Every day | ORAL | Status: DC
  Administered 2016-04-30 – 2016-05-01 (×2): 500 mg via ORAL
  Filled 2016-04-30 (×2): qty 1

## 2016-04-30 MED ORDER — METHYLPREDNISOLONE SODIUM SUCC 40 MG IJ SOLR
40.0000 mg | Freq: Four times a day (QID) | INTRAMUSCULAR | Status: DC
  Administered 2016-04-30 – 2016-05-01 (×3): 40 mg via INTRAVENOUS
  Filled 2016-04-30 (×3): qty 1

## 2016-04-30 NOTE — Evaluation (Signed)
Physical Therapy Evaluation Patient Details Name: Terri Burns MRN: [id number] DOB: [birth year] Today's Date: 04/30/2016   History of Present Illness  Terri Burns  is a 65 y.o. female with a known history of COPD and hypertension. She has had shortness breath, wheezing and cough for the past 2 days. Patient was diagnosed with COPD exacerbation;   Clinical Impression  65 yo Female came to ED with increased shortness of breath. She was diagnosed with COPD exacerbation. Patient reports being independent in all ADLs prior to admittance. She currently is mod I for bed mobility;  She was supervision for sit<>Stand transfers without AD. Patient ambulated  100 feet with CGA with 2L O2. With increased shortness of breath. Her SPo2 was 95% despite shortness of breath. Patient does demonstrate low endurance and increased fatigue. She does have some weakness in BLE. She would benefit from additional skilled PT intervention to improve balance/gait safety and reduce fall risk;    Follow Up Recommendations Home health PT;Supervision - Intermittent    Equipment Recommendations  None recommended by PT    Recommendations for Other Services       Precautions / Restrictions Precautions Precautions: Fall Restrictions Weight Bearing Restrictions: No      Mobility  Bed Mobility Overal bed mobility: Needs Assistance Bed Mobility: Supine to Sit     Supine to sit: Modified independent (Device/Increase time)     General bed mobility comments: used bed rail; able to pull self up with minimal difficulty;   Transfers Overall transfer level: Needs assistance Equipment used: None Transfers: Sit to/from Stand Sit to Stand: Supervision         General transfer comment: requires min VCs for hand placement;   Ambulation/Gait Ambulation/Gait assistance: Min guard Ambulation Distance (Feet): 100 Feet Assistive device: None Gait Pattern/deviations: Step-through pattern;Decreased step length -  right;Decreased step length - left;Decreased dorsiflexion - left;Decreased dorsiflexion - right;Decreased stride length;Shuffle;Narrow base of support Gait velocity: decreased   General Gait Details: patient ambulated very slowly; very short of breath; SPO2 was 95% with 2L O2 during ambulation; Patient very fatigued   Stairs            Wheelchair Mobility    Modified Rankin (Stroke Patients Only)       Balance Overall balance assessment: Needs assistance Sitting-balance support: No upper extremity supported;Feet supported Sitting balance-Leahy Scale: Good     Standing balance support: No upper extremity supported Standing balance-Leahy Scale: Fair Standing balance comment: required CGA for safety with Ambulation;                              Pertinent Vitals/Pain Pain Assessment: 0-10 Pain Score: 3  Pain Location: bilateral shoulder, chronic pain;  Pain Descriptors / Indicators: Aching Pain Intervention(s): Monitored during session    Home Living Family/patient expects to be discharged to:: Private residence Living Arrangements: Other relatives (grandson lives with her; ) Available Help at Discharge: Other (Comment);Family;Friend(s) (has people who can check on her regularly throughout day; ) Type of Home: House Home Access: Ramped entrance     Home Layout: One level Home Equipment: Grab bars - toilet;Grab bars - tub/shower      Prior Function Level of Independence: Independent         Comments: reports being independent in all ADLs; she reports fixing thanksgiving dinner without difficulty;      Hand Dominance        Extremity/Trunk Assessment   Upper Extremity  Assessment: Overall WFL for tasks assessed           Lower Extremity Assessment: Generalized weakness (grossly 4-/5)      Cervical / Trunk Assessment: Normal  Communication   Communication: No difficulties  Cognition Arousal/Alertness: Awake/alert Behavior During  Therapy: WFL for tasks assessed/performed Overall Cognitive Status: Within Functional Limits for tasks assessed                      General Comments General comments (skin integrity, edema, etc.): no edema in LE noted;     Exercises     Assessment/Plan    PT Assessment Patient needs continued PT services  PT Problem List Decreased strength;Decreased activity tolerance;Decreased balance;Decreased safety awareness;Cardiopulmonary status limiting activity          PT Treatment Interventions Gait training;Functional mobility training;Balance training;Therapeutic exercise;Therapeutic activities;Patient/family education    PT Goals (Current goals can be found in the Care Plan section)       Frequency Min 2X/week   Barriers to discharge Decreased caregiver support however has friends/family that will check on her regularly throughout day;     Co-evaluation               End of Session Equipment Utilized During Treatment: Gait belt Activity Tolerance: Patient limited by fatigue;Other (comment) (very short of breath; ) Patient left: in chair;with call bell/phone within reach;with chair alarm set Nurse Communication: Mobility status         Time: 1420-1445 PT Time Calculation (min) (ACUTE ONLY): 25 min   Charges:   PT Evaluation $PT Eval Low Complexity: 1 Procedure     PT G Codes:        Llewellyn Choplin PT, DPT 04/30/2016, 2:54 PM

## 2016-04-30 NOTE — Progress Notes (Addendum)
[facility] NAME: Munachi Hollopeter    MR#:  [id number]  DATE OF BIRTH:  01-Jun-1951  SUBJECTIVE:   feeling weak this am still very SOB  REVIEW OF SYSTEMS:    Review of Systems  Constitutional: Positive for malaise/fatigue. Negative for chills and fever.  HENT: Negative.  Negative for ear discharge, ear pain, hearing loss, nosebleeds and sore throat.   Eyes: Negative.  Negative for blurred vision and pain.  Respiratory: Positive for cough, sputum production, shortness of breath and wheezing. Negative for hemoptysis.   Cardiovascular: Negative.  Negative for chest pain, palpitations and leg swelling.  Gastrointestinal: Negative.  Negative for abdominal pain, blood in stool, diarrhea, nausea and vomiting.  Genitourinary: Negative.  Negative for dysuria.  Musculoskeletal: Negative.  Negative for back pain.  Skin: Negative.   Neurological: Positive for weakness. Negative for dizziness, tremors, speech change, focal weakness, seizures and headaches.  Endo/Heme/Allergies: Negative.  Does not bruise/bleed easily.  Psychiatric/Behavioral: Negative.  Negative for depression, hallucinations and suicidal ideas.    Tolerating Diet: yes      DRUG ALLERGIES:   Allergies  Allergen Reactions  . Levofloxacin Shortness Of Breath  . Penicillins Hives    itching    VITALS:  Blood pressure (!) 165/88, pulse 65, temperature 98.6 F (37 C), temperature source Oral, resp. rate 20, height 5' (1.524 m), weight 49 kg (108 lb), SpO2 99 %.  PHYSICAL EXAMINATION:   Physical Exam  Constitutional: She is oriented to person, place, and time and well-developed, well-nourished, and in no distress. No distress.  HENT:  Head: Normocephalic.  Eyes: No scleral icterus.  Neck: Normal range of motion. Neck supple. No JVD present. No tracheal deviation present.  Cardiovascular: Normal rate, regular rhythm and normal heart sounds.  Exam reveals no gallop and no  friction rub.   No murmur heard. Pulmonary/Chest: Effort normal. No respiratory distress. She has wheezes. She has no rales. She exhibits no tenderness.  Abdominal: Soft. Bowel sounds are normal. She exhibits no distension and no mass. There is no tenderness. There is no rebound and no guarding.  Musculoskeletal: Normal range of motion. She exhibits no edema.  Neurological: She is alert and oriented to person, place, and time.  Skin: Skin is warm. No rash noted. No erythema.  Psychiatric: Affect and judgment normal.      LABORATORY PANEL:   CBC  Recent Labs Lab 04/30/16 0426  WBC 7.2  HGB 10.3*  HCT 30.6*  PLT 278   ------------------------------------------------------------------------------------------------------------------  Chemistries   Recent Labs Lab 04/30/16 0426  NA 136  K 3.0*  CL 101  CO2 27  GLUCOSE 142*  BUN 12  CREATININE 0.53  CALCIUM 8.7*   ------------------------------------------------------------------------------------------------------------------  Cardiac Enzymes  Recent Labs Lab 04/29/16 1750 04/29/16 2314  TROPONINI 0.09* 0.07*   ------------------------------------------------------------------------------------------------------------------  RADIOLOGY:  Dg Chest 2 View  Result Date: 04/29/2016 CLINICAL DATA:  Shortness of breath, productive cough. EXAM: CHEST  2 VIEW COMPARISON:  Radiographs of April 15, 2014. FINDINGS: The heart size and mediastinal contours are within normal limits. Stable scarring is noted in left upper lobe in both lung bases. No acute pulmonary disease is noted. No pneumothorax is noted. The visualized skeletal structures are unremarkable. IMPRESSION: Stable bilateral scarring. No acute cardiopulmonary abnormality seen. Electronically Signed   By: Marijo Conception, M.D.   On: 04/29/2016 18:37     ASSESSMENT AND PLAN:    65 y.o. female with a known history  of COPD and hypertension presents with COPD  exacerbation.  1. Acute hypoxic respiratory failure with COPD exacerbations and acute bronchitis:   2. Acute COPD exacerbation with bronchtitis: Continue IV steroids, Nebs and inhalers Continue Azithromycin  3. Elevated troponin due to demand ischemia not ACS.  4. Tobacco dependence: Counseled to stop smoking 3 minutes. Patient not quite ready  5. Hypokalemia: replete and check Mg.      Management plans discussed with the patient and she  is in agreement.  CODE STATUS: full  TOTAL TIME TAKING CARE OF THIS PATIENT: 33 minutes.     POSSIBLE D/C 1-2 days, DEPENDING ON CLINICAL CONDITION.   Loyce Klasen M.D on 04/30/2016 at 8:51 AM  Between 7am to 6pm - Pager - [phone] After 6pm go to [email or web address] - password [email or web address] [facility]  Office  [phone]  CC: Primary care physician; Keith Rake, MD  Note: This dictation was prepared with Dragon dictation along with smaller phrase technology. Any transcriptional errors that result from this process are unintentional.

## 2016-04-30 NOTE — Progress Notes (Signed)
PHARMACIST - PHYSICIAN COMMUNICATION DR:   Benjie Karvonen CONCERNING: Antibiotic IV to Oral Route Change Policy  RECOMMENDATION: This patient is receiving azithromycin by the intravenous route.  Based on criteria approved by the Pharmacy and Therapeutics Committee, the antibiotic(s) is/are being converted to the equivalent oral dose form(s).   DESCRIPTION: These criteria include:  Patient being treated for a respiratory tract infection, urinary tract infection, cellulitis or clostridium difficile associated diarrhea if on metronidazole  The patient is not neutropenic and does not exhibit a GI malabsorption state  The patient is eating (either orally or via tube) and/or has been taking other orally administered medications for a least 24 hours  The patient is improving clinically and has a Tmax < 100.5  If you have questions about this conversion, please contact the [place], PharmD, BCPS Clinical Pharmacist 04/30/16 1:39 PM

## 2016-05-01 LAB — BASIC METABOLIC PANEL
ANION GAP: 9 (ref 5–15)
BUN: 19 mg/dL (ref 6–20)
CHLORIDE: 102 mmol/L (ref 101–111)
CO2: 23 mmol/L (ref 22–32)
Calcium: 8.8 mg/dL — ABNORMAL LOW (ref 8.9–10.3)
Creatinine, Ser: 0.6 mg/dL (ref 0.44–1.00)
GFR calc non Af Amer: >60 mL/min (ref >60)
Glucose, Bld: 117 mg/dL — ABNORMAL HIGH (ref 65–99)
POTASSIUM: 3.5 mmol/L (ref 3.5–5.1)
Sodium: 134 mmol/L — ABNORMAL LOW (ref 135–145)

## 2016-05-01 MED ORDER — ZOLPIDEM TARTRATE 5 MG PO TABS
5.0000 mg | ORAL_TABLET | Freq: Every evening | ORAL | Status: DC | PRN
  Administered 2016-05-01: 5 mg via ORAL
  Filled 2016-05-01: qty 1

## 2016-05-01 MED ORDER — METHYLPREDNISOLONE SODIUM SUCC 40 MG IJ SOLR
40.0000 mg | Freq: Two times a day (BID) | INTRAMUSCULAR | Status: DC
  Administered 2016-05-01 – 2016-05-02 (×2): 40 mg via INTRAVENOUS
  Filled 2016-05-01 (×2): qty 1

## 2016-05-01 NOTE — Progress Notes (Signed)
Pt reports feeling better this evening. She is now able to maintain oxygen saturations in the high 90s on RA when at rest. She does report feeling SOB with any type of exertion and continues to require oxygen during these times. She did not get much sleep last night, I spoke to Dr. Benjie Karvonen about this earlier in the day and she added prn ambien at bedtime to help the pt sleep. Pt is aware of this and knows to ask for it at bedtime if she feels she needs it.

## 2016-05-01 NOTE — Progress Notes (Signed)
[facility] NAME: Anglie Sandler    MR#:  [id number]  DATE OF BIRTH:  March 05, 1951  SUBJECTIVE:    Wants to go home   REVIEW OF SYSTEMS:    Review of Systems  Constitutional: Positive for malaise/fatigue. Negative for chills and fever.  HENT: Negative.  Negative for ear discharge, ear pain, hearing loss, nosebleeds and sore throat.   Eyes: Negative.  Negative for blurred vision and pain.  Respiratory: Positive for cough, sputum production, shortness of breath and wheezing. Negative for hemoptysis.   Cardiovascular: Negative.  Negative for chest pain, palpitations and leg swelling.  Gastrointestinal: Negative.  Negative for abdominal pain, blood in stool, diarrhea, nausea and vomiting.  Genitourinary: Negative.  Negative for dysuria.  Musculoskeletal: Negative.  Negative for back pain.  Skin: Negative.   Neurological: Positive for weakness. Negative for dizziness, tremors, speech change, focal weakness, seizures and headaches.  Endo/Heme/Allergies: Negative.  Does not bruise/bleed easily.  Psychiatric/Behavioral: Negative.  Negative for depression, hallucinations and suicidal ideas.    Tolerating Diet: yes      DRUG ALLERGIES:   Allergies  Allergen Reactions  . Levofloxacin Shortness Of Breath  . Penicillins Hives    itching    VITALS:  Blood pressure (!) 148/101, pulse 87, temperature 97.7 F (36.5 C), temperature source Oral, resp. rate (!) 21, height 5' (1.524 m), weight 49 kg (108 lb), SpO2 96 %.  PHYSICAL EXAMINATION:   Physical Exam  Constitutional: She is oriented to person, place, and time and well-developed, well-nourished, and in no distress. No distress.  HENT:  Head: Normocephalic.  Eyes: No scleral icterus.  Neck: Normal range of motion. Neck supple. No JVD present. No tracheal deviation present.  Cardiovascular: Normal rate, regular rhythm and normal heart sounds.  Exam reveals no gallop and no friction  rub.   No murmur heard. Pulmonary/Chest: Effort normal. No respiratory distress. She has wheezes. She has no rales. She exhibits no tenderness.  Abdominal: Soft. Bowel sounds are normal. She exhibits no distension and no mass. There is no tenderness. There is no rebound and no guarding.  Musculoskeletal: Normal range of motion. She exhibits no edema.  Neurological: She is alert and oriented to person, place, and time.  Skin: Skin is warm. No rash noted. No erythema.  Psychiatric: Affect and judgment normal.      LABORATORY PANEL:   CBC  Recent Labs Lab 04/30/16 0426  WBC 7.2  HGB 10.3*  HCT 30.6*  PLT 278   ------------------------------------------------------------------------------------------------------------------  Chemistries   Recent Labs Lab 04/30/16 0423  05/01/16 0428  NA  --   < > 134*  K  --   < > 3.5  CL  --   < > 102  CO2  --   < > 23  GLUCOSE  --   < > 117*  BUN  --   < > 19  CREATININE  --   < > 0.60  CALCIUM  --   < > 8.8*  MG 1.8  --   --   < > = values in this interval not displayed. ------------------------------------------------------------------------------------------------------------------  Cardiac Enzymes  Recent Labs Lab 04/29/16 1750 04/29/16 2314  TROPONINI 0.09* 0.07*   ------------------------------------------------------------------------------------------------------------------  RADIOLOGY:  Dg Chest 2 View  Result Date: 04/29/2016 CLINICAL DATA:  Shortness of breath, productive cough. EXAM: CHEST  2 VIEW COMPARISON:  Radiographs of April 15, 2014. FINDINGS: The heart size and mediastinal contours are within normal limits. Stable  scarring is noted in left upper lobe in both lung bases. No acute pulmonary disease is noted. No pneumothorax is noted. The visualized skeletal structures are unremarkable. IMPRESSION: Stable bilateral scarring. No acute cardiopulmonary abnormality seen. Electronically Signed   By: Marijo Conception, M.D.   On: 04/29/2016 18:37     ASSESSMENT AND PLAN:    65 y.o. female with a known history of COPD and hypertension presents with COPD exacerbation.  1. Acute hypoxic respiratory failure with COPD exacerbations and acute bronchitis: Continue to wean oxygen.  2. Acute COPD exacerbation with bronchtitis: Continue IV steroids at current dose Nebs and inhalers Continue Azithromycin  3. Elevated troponin due to demand ischemia not ACS.  4. Tobacco dependence: Counseled to stop smoking 3 minutes. Patient not quite ready  5. Hypokalemia: replete PRN.   PT recs HHC  Management plans discussed with the patient and she  is in agreement.  CODE STATUS: full  TOTAL TIME TAKING CARE OF THIS PATIENT: 25 minutes.     POSSIBLE D/C tomorrow, DEPENDING ON CLINICAL CONDITION.   Deaveon Schoen M.D on 05/01/2016 at 9:48 AM  Between 7am to 6pm - Pager - [phone] After 6pm go to [email or web address] - password [email or web address] [facility]  Office  [phone]  CC: Primary care physician; Keith Rake, MD  Note: This dictation was prepared with Dragon dictation along with smaller phrase technology. Any transcriptional errors that result from this process are unintentional.

## 2016-05-01 NOTE — Progress Notes (Addendum)
MD updated. Tylenol effective. Will endorse to oncoming RN.   Patient reports right shoulder pain 6/10 with garded movement and limited ROM in the arm. Patient stating it started a month ago and she is not sure what caused it. PRN tylenol administered. Call placed to on call provider, awaiting call back

## 2016-05-02 ENCOUNTER — Other Ambulatory Visit: Payer: Self-pay | Admitting: Family Medicine

## 2016-05-02 DIAGNOSIS — I10 Essential (primary) hypertension: Secondary | ICD-10-CM

## 2016-05-02 MED ORDER — PREDNISONE 10 MG PO TABS: 10.0000 mg | ORAL_TABLET | Freq: Every day | ORAL | 0 refills | Status: DC

## 2016-05-02 MED ORDER — IPRATROPIUM-ALBUTEROL 0.5-2.5 (3) MG/3ML IN SOLN: 3.0000 mL | Freq: Four times a day (QID) | RESPIRATORY_TRACT | 0 refills | Status: DC | PRN

## 2016-05-02 MED ORDER — NICOTINE 14 MG/24HR TD PT24: 14.0000 mg | MEDICATED_PATCH | Freq: Every day | TRANSDERMAL | 0 refills | Status: DC

## 2016-05-02 MED ORDER — AZITHROMYCIN 500 MG PO TABS: 500.0000 mg | ORAL_TABLET | Freq: Every day | ORAL | 0 refills | Status: AC

## 2016-05-02 NOTE — Care Management (Signed)
Admitted to [facility] with the diagnosis of COPD.  64 year old grandson lives in the home with her. Son is Terri Burns [phone]. Seen Dr. Manuella Ghazi in September. No home health or skilled facilities in the past. No home oxygen. Takes care of all basic activities of daily living herself, drives. Appetite is better. No falls. Uses no equipment for ambulation skills. [facility].  Physical therapy is recommending home with home health an therapy in the home. Terri Burns is declining these services at this time. "I think I'II be alright." States she does have Medicare and Medicaid as her insurances.. Requested that when son comes to transport her home if a copy of these cards could be obtained. Discharge to home today per Dr. Benjie Karvonen. Shelbie Ammons RN MSN CCM Care Management

## 2016-05-02 NOTE — Discharge Summary (Signed)
Terri Burns NAME: Terri Burns    MR#:  [id number]  DATE OF BIRTH:  10/20/1950  DATE OF ADMISSION:  04/29/2016 ADMITTING PHYSICIAN: Demetrios Loll, MD  DATE OF DISCHARGE: 05/02/2016  PRIMARY CARE PHYSICIAN: Keith Rake, MD    ADMISSION DIAGNOSIS:  Elevated troponin [R74.8] COPD exacerbation (HCC) [J44.1]  DISCHARGE DIAGNOSIS:  Active Problems:   COPD exacerbation ([place])   SECONDARY DIAGNOSIS:   Past Medical History:  Diagnosis Date  . COPD (chronic obstructive pulmonary disease) ([place])   . Hypertension     HOSPITAL COURSE:   65 y.o.femalewith a known history of COPD and hypertension presents with COPD exacerbation.  1. Acute hypoxic respiratory failure with COPD exacerbation and acute bronchitis: Patient is treated for COPD exacerbation and has been weaned off of oxygen.   2. Acute COPD exacerbation with bronchtitis:  She was initiated on IV steroids and has much improved since admission. She will require oral steroid taper at discharge. She will continue with azithromycin for bronchitis. She will also need to continue nebulizer and inhalers.  3. Elevated troponin due to demand ischemia not ACS.  4. Tobacco dependence: Counseled to stop smoking. At discharge she states she has not even wanted a cigarette. 5. Hypokalemia: This was repleted.  DISCHARGE CONDITIONS AND DIET:   Stable for discharge on regular diet  CONSULTS OBTAINED:    DRUG ALLERGIES:   Allergies  Allergen Reactions  . Levofloxacin Shortness Of Breath  . Penicillins Hives    itching    DISCHARGE MEDICATIONS:   Current Discharge Medication List    START taking these medications   Details  azithromycin (ZITHROMAX) 500 MG tablet Take 1 tablet (500 mg total) by mouth daily at 6 PM. Qty: 4 tablet, Refills: 0    ipratropium-albuterol (DUONEB) 0.5-2.5 (3) MG/3ML SOLN Take 3 mLs by nebulization every 6 (six) hours as needed. Qty: 360 mL,  Refills: 0    nicotine (NICODERM CQ - DOSED IN MG/24 HOURS) 14 mg/24hr patch Place 1 patch (14 mg total) onto the skin daily. Qty: 28 patch, Refills: 0    predniSONE (DELTASONE) 10 MG tablet Take 1 tablet (10 mg total) by mouth daily with breakfast. 60 mg PO (ORAL) x 2 days 50 mg PO (ORAL)  x 2 days 40 mg PO (ORAL)  x 2 days 30 mg PO  (ORAL)  x 2 days 20 mg PO  (ORAL) x 2 days 10 mg PO  (ORAL) x 2 days then stop Qty: 42 tablet, Refills: 0      CONTINUE these medications which have NOT CHANGED   Details  budesonide-formoterol (SYMBICORT) 160-4.5 MCG/ACT inhaler Inhale 2 puffs into the lungs 2 (two) times daily. Qty: 1 Inhaler, Refills: 2   Associated Diagnoses: Chronic bronchitis, unspecified chronic bronchitis type (HCC)    citalopram (CELEXA) 20 MG tablet TAKE 1 TABLET (20 MG TOTAL) BY MOUTH DAILY. Qty: 90 tablet, Refills: 0   Associated Diagnoses: Depression    lisinopril-hydrochlorothiazide (PRINZIDE,ZESTORETIC) 20-25 MG tablet Take 1 tablet by mouth daily. Qty: 90 tablet, Refills: 1   Associated Diagnoses: Essential hypertension    metoprolol succinate (TOPROL-XL) 25 MG 24 hr tablet TAKE 1 TABLET (25 MG TOTAL) BY MOUTH DAILY. Qty: 90 tablet, Refills: 1   Associated Diagnoses: Essential hypertension    omeprazole (PRILOSEC) 20 MG capsule TAKE 1 CAPSULE (20 MG TOTAL) BY MOUTH DAILY. Qty: 90 capsule, Refills: 0   Associated Diagnoses: Gastroesophageal reflux disease, esophagitis presence not specified  VENTOLIN HFA 108 (90 Base) MCG/ACT inhaler INHALE 2 PUFFS INTO THE LUNGS EVERY 6 (SIX) HOURS AS NEEDED FOR WHEEZING OR SHORTNESS OF BREATH. Qty: 18 Inhaler, Refills: 2   Associated Diagnoses: Chronic bronchitis, unspecified chronic bronchitis type ([place])              Today   CHIEF COMPLAINT:  Patient doing well this morning. Shortness of breath is improved. She still has some wheezing   VITAL SIGNS:  Blood pressure 135/74, pulse 64, temperature 98 F (36.7 C),  temperature source Oral, resp. rate 17, height 5' (1.524 m), weight 49 kg (108 lb), SpO2 98 %.   REVIEW OF SYSTEMS:  Review of Systems  Respiratory: Positive for wheezing.      PHYSICAL EXAMINATION:  GENERAL:  65 y.o.-year-old patient lying in the bed with no acute distress.  NECK:  Supple, no jugular venous distention. No thyroid enlargement, no tenderness.  LUNGS:Good air movement with bilateral wheezing. No increased work of breathing no rales,rhonchi  No use of accessory muscles of respiration.  CARDIOVASCULAR: S1, S2 normal. No murmurs, rubs, or gallops.  ABDOMEN: Soft, non-tender, non-distended. Bowel sounds present. No organomegaly or mass.  EXTREMITIES: No pedal edema, cyanosis, or clubbing.  PSYCHIATRIC: The patient is alert and oriented x 3.  SKIN: No obvious rash, lesion, or ulcer.   DATA REVIEW:   CBC  Recent Labs Lab 04/30/16 0426  WBC 7.2  HGB 10.3*  HCT 30.6*  PLT 278    Chemistries   Recent Labs Lab 04/30/16 0423  05/01/16 0428  NA  --   < > 134*  K  --   < > 3.5  CL  --   < > 102  CO2  --   < > 23  GLUCOSE  --   < > 117*  BUN  --   < > 19  CREATININE  --   < > 0.60  CALCIUM  --   < > 8.8*  MG 1.8  --   --   < > = values in this interval not displayed.  Cardiac Enzymes  Recent Labs Lab 04/29/16 1750 04/29/16 2314  TROPONINI 0.09* 0.07*    Microbiology Results  @[email or web address] @  RADIOLOGY:  No results found.    Management plans discussed with the patient and she is in agreement. Stable for discharge home  Patient should follow up with pcp  CODE STATUS:     Code Status Orders        Start     Ordered   04/29/16 2027  Full code  Continuous     04/29/16 2026    Code Status History    Date Active Date Inactive Code Status Order ID Comments User Context   This patient has a current code status but no historical code status.      TOTAL TIME TAKING CARE OF THIS PATIENT: 35 minutes.    Note: This dictation was prepared  with Dragon dictation along with smaller phrase technology. Any transcriptional errors that result from this process are unintentional.  Nabil Bubolz M.D on 05/02/2016 at 8:00 AM  Between 7am to 6pm - Pager - [phone] After 6pm go to [email or web address] - password [email or web address] [facility]  Office  [phone]  CC: Primary care physician; Keith Rake, MD

## 2016-05-02 NOTE — Progress Notes (Signed)
Discharge instructions given and went over with patient at bedside. Prescription given. Follow up appointment reviewed. All questions answered. Patient verbalized understanding. Patient to be discharged home. Awaiting transportation - to be provided by son. Madlyn Frankel, RN

## 2016-05-02 NOTE — Progress Notes (Signed)
Patient discharged via wheelchair by nursing staff. Jameelah Watts S, RN  

## 2016-05-09 ENCOUNTER — Other Ambulatory Visit: Payer: Self-pay

## 2016-05-09 ENCOUNTER — Encounter: Payer: Self-pay | Admitting: Family Medicine

## 2016-05-09 ENCOUNTER — Ambulatory Visit (INDEPENDENT_AMBULATORY_CARE_PROVIDER_SITE_OTHER): Payer: Medicare Other | Admitting: Family Medicine

## 2016-05-09 VITALS — BP 124/86 | HR 69 | Temp 98.3°F | Wt 111.1 lb

## 2016-05-09 DIAGNOSIS — J441 Chronic obstructive pulmonary disease with (acute) exacerbation: Secondary | ICD-10-CM | POA: Diagnosis not present

## 2016-05-09 DIAGNOSIS — Z23 Encounter for immunization: Secondary | ICD-10-CM | POA: Diagnosis not present

## 2016-05-09 DIAGNOSIS — F3342 Major depressive disorder, recurrent, in full remission: Secondary | ICD-10-CM

## 2016-05-09 MED ORDER — CITALOPRAM HYDROBROMIDE 20 MG PO TABS: 20.0000 mg | ORAL_TABLET | Freq: Every day | ORAL | 1 refills | Status: DC

## 2016-05-09 NOTE — Progress Notes (Signed)
Name: Terri Burns   MRN: [id number]    DOB: 1950/08/17   Date:05/09/2016       Progress Note  Subjective  Chief Complaint  Chief Complaint  Patient presents with  . Follow-up    Just got out the hospital last Monday for COPD complications.     HPI  Pt. Presents for hospital follow up, admitted on Friday, November 24 th, admitted for COPD with acute respiratory failure. She ran out of money and could not afford Symbicort and started getting more symptomatic necessitating hospital admission. She was treated with IV steroid and nebulizing treatments, antibioitcs and supplemetal oxygen. She feels better, finishing her prednisone taper and has competed antibiotic course. She has Ventolin, Symbicort, and Nebulizers at home.  Past Medical History:  Diagnosis Date  . COPD (chronic obstructive pulmonary disease) ([place])   . Hypertension     No past surgical history on file.  Family History  Problem Relation Age of Onset  . Hypertension Mother     Social History   Social History  . Marital status: Widowed    Spouse name: N/A  . Number of children: N/A  . Years of education: N/A   Occupational History  . Not on file.   Social History Main Topics  . Smoking status: Current Every Day Smoker    Packs/day: 0.25    Years: 40.00    Types: Cigarettes  . Smokeless tobacco: Never Used  . Alcohol use No  . Drug use: No  . Sexual activity: No   Other Topics Concern  . Not on file   Social History Narrative  . No narrative on file     Current Outpatient Prescriptions:  .  budesonide-formoterol (SYMBICORT) 160-4.5 MCG/ACT inhaler, Inhale 2 puffs into the lungs 2 (two) times daily., Disp: 1 Inhaler, Rfl: 2 .  citalopram (CELEXA) 20 MG tablet, TAKE 1 TABLET (20 MG TOTAL) BY MOUTH DAILY., Disp: 90 tablet, Rfl: 0 .  ipratropium-albuterol (DUONEB) 0.5-2.5 (3) MG/3ML SOLN, Take 3 mLs by nebulization every 6 (six) hours as needed., Disp: 360 mL, Rfl: 0 .   lisinopril-hydrochlorothiazide (PRINZIDE,ZESTORETIC) 20-25 MG tablet, Take 1 tablet by mouth daily., Disp: 90 tablet, Rfl: 1 .  metoprolol succinate (TOPROL-XL) 25 MG 24 hr tablet, TAKE 1 TABLET (25 MG TOTAL) BY MOUTH DAILY., Disp: 90 tablet, Rfl: 1 .  omeprazole (PRILOSEC) 20 MG capsule, TAKE 1 CAPSULE (20 MG TOTAL) BY MOUTH DAILY., Disp: 90 capsule, Rfl: 0 .  predniSONE (DELTASONE) 10 MG tablet, Take 1 tablet (10 mg total) by mouth daily with breakfast. 60 mg PO (ORAL) x 2 days 50 mg PO (ORAL)  x 2 days 40 mg PO (ORAL)  x 2 days 30 mg PO  (ORAL)  x 2 days 20 mg PO  (ORAL) x 2 days 10 mg PO  (ORAL) x 2 days then stop, Disp: 42 tablet, Rfl: 0 .  VENTOLIN HFA 108 (90 Base) MCG/ACT inhaler, INHALE 2 PUFFS INTO THE LUNGS EVERY 6 (SIX) HOURS AS NEEDED FOR WHEEZING OR SHORTNESS OF BREATH., Disp: 18 Inhaler, Rfl: 2 .  nicotine (NICODERM CQ - DOSED IN MG/24 HOURS) 14 mg/24hr patch, Place 1 patch (14 mg total) onto the skin daily. (Patient not taking: Reported on 05/09/2016), Disp: 28 patch, Rfl: 0  Allergies  Allergen Reactions  . Levofloxacin Shortness Of Breath  . Penicillins Hives    itching     Review of Systems  Constitutional: Positive for malaise/fatigue. Negative for chills and fever.  Respiratory: Positive  for cough and sputum production. Negative for hemoptysis.   Cardiovascular: Negative for chest pain and PND.      Objective  Vitals:   05/09/16 1135  BP: 124/86  Pulse: 69  Temp: 98.3 F (36.8 C)  SpO2: 94%  Weight: 111 lb 1.6 oz (50.4 kg)    Physical Exam  Constitutional: She is oriented to person, place, and time and well-developed, well-nourished, and in no distress.  Cardiovascular: Normal rate, regular rhythm, S1 normal, S2 normal and normal heart sounds.   No murmur heard. Pulmonary/Chest: No respiratory distress. She has wheezes in the right middle field, the right lower field, the left middle field and the left lower field. She has no rhonchi. She has no rales.   Neurological: She is alert and oriented to person, place, and time.  Nursing note and vitals reviewed.      Assessment & Plan  1. Need for influenza vaccination  - Flu Vaccine QUAD 36+ mos PF IM (Fluarix & Fluzone Quad PF)  2. Recurrent major depressive disorder, in full remission ([place]) Stable, responsive to Celexa taken daily. - citalopram (CELEXA) 20 MG tablet; Take 1 tablet (20 mg total) by mouth daily.  Dispense: 90 tablet; Refill: 1  3. COPD with acute exacerbation [facility]) [place] summary reviewed, patient finished her antibiotic course, on prednisone tapering course. She has nebulizers, rescue inhalers and inhaled steroids at home. Again encouraged to quit smoking. Follow-up in one month   Ayme Short [facility]ad [facility] 05/09/2016 11:49 AM

## 2016-05-27 ENCOUNTER — Ambulatory Visit: Payer: Medicare Other

## 2016-06-17 ENCOUNTER — Telehealth: Payer: Self-pay | Admitting: Family Medicine

## 2016-06-17 DIAGNOSIS — J42 Unspecified chronic bronchitis: Secondary | ICD-10-CM

## 2016-06-17 MED ORDER — BUDESONIDE-FORMOTEROL FUMARATE 160-4.5 MCG/ACT IN AERO: 2.0000 | INHALATION_SPRAY | Freq: Two times a day (BID) | RESPIRATORY_TRACT | 2 refills | Status: DC

## 2016-06-17 MED ORDER — IPRATROPIUM-ALBUTEROL 0.5-2.5 (3) MG/3ML IN SOLN: 3.0000 mL | Freq: Four times a day (QID) | RESPIRATORY_TRACT | 0 refills | Status: DC | PRN

## 2016-06-17 NOTE — Telephone Encounter (Signed)
Prescription for Symbicort and ipratropium/albuterol is sent to patient's pharmacy

## 2016-06-17 NOTE — Telephone Encounter (Signed)
PT SAID THAT THE PHARM ( [facility]) HAS SENT IN SEVERAL REQUEST FOR HER REFILLS ON HER RX AND THEY HAVE NOT HEARD ANYTHING BACK FOR THEM. THEY ARE. SAYS THAT SHE IS OUT AND NEEDS ESPECIALLY THE SYMBICORT.  SYMBICORT IPRATROPIUM -ALBUTEROL

## 2016-06-27 ENCOUNTER — Ambulatory Visit: Payer: Medicare Other

## 2016-09-11 ENCOUNTER — Other Ambulatory Visit: Payer: Self-pay | Admitting: Family Medicine

## 2016-09-11 DIAGNOSIS — J42 Unspecified chronic bronchitis: Secondary | ICD-10-CM

## 2016-09-19 ENCOUNTER — Other Ambulatory Visit: Payer: Self-pay | Admitting: Family Medicine

## 2016-09-19 DIAGNOSIS — Z1231 Encounter for screening mammogram for malignant neoplasm of breast: Secondary | ICD-10-CM

## 2016-09-20 ENCOUNTER — Ambulatory Visit (INDEPENDENT_AMBULATORY_CARE_PROVIDER_SITE_OTHER): Payer: Medicare Other | Admitting: Family Medicine

## 2016-09-20 ENCOUNTER — Encounter: Payer: Self-pay | Admitting: Family Medicine

## 2016-09-20 VITALS — BP 123/69 | HR 66 | Temp 98.3°F | Resp 16 | Ht 60.0 in | Wt 109.6 lb

## 2016-09-20 DIAGNOSIS — I10 Essential (primary) hypertension: Secondary | ICD-10-CM | POA: Diagnosis not present

## 2016-09-20 DIAGNOSIS — J42 Unspecified chronic bronchitis: Secondary | ICD-10-CM

## 2016-09-20 DIAGNOSIS — K219 Gastro-esophageal reflux disease without esophagitis: Secondary | ICD-10-CM | POA: Diagnosis not present

## 2016-09-20 DIAGNOSIS — Z720 Tobacco use: Secondary | ICD-10-CM

## 2016-09-20 DIAGNOSIS — R739 Hyperglycemia, unspecified: Secondary | ICD-10-CM

## 2016-09-20 MED ORDER — BUDESONIDE-FORMOTEROL FUMARATE 160-4.5 MCG/ACT IN AERO: 2.0000 | INHALATION_SPRAY | Freq: Two times a day (BID) | RESPIRATORY_TRACT | 2 refills | Status: DC

## 2016-09-20 MED ORDER — NICOTINE 7 MG/24HR TD PT24: 7.0000 mg | MEDICATED_PATCH | Freq: Every day | TRANSDERMAL | 0 refills | Status: AC

## 2016-09-20 MED ORDER — METOPROLOL SUCCINATE ER 25 MG PO TB24: ORAL_TABLET | ORAL | 1 refills | Status: DC

## 2016-09-20 MED ORDER — OMEPRAZOLE 20 MG PO CPDR: 20.0000 mg | DELAYED_RELEASE_CAPSULE | Freq: Every day | ORAL | 1 refills | Status: DC

## 2016-09-20 MED ORDER — NICOTINE 14 MG/24HR TD PT24: 14.0000 mg | MEDICATED_PATCH | Freq: Every day | TRANSDERMAL | 0 refills | Status: AC

## 2016-09-20 NOTE — Progress Notes (Signed)
Name: Terri Burns   MRN: [id number]    DOB: 1950/07/17   Date:09/20/2016       Progress Note  Subjective  Chief Complaint  Chief Complaint  Patient presents with  . Follow-up    3 mo  . Medication Refill    Gastroesophageal Reflux  She complains of a sore throat. She reports no abdominal pain, no belching, no chest pain, no dysphagia or no heartburn. This is a chronic problem. The symptoms are aggravated by certain foods (foods that contain acid including citrus fruits, spicy foods). She has tried a PPI for the symptoms. Past procedures do not include an EGD.  Hypertension  This is a chronic problem. The problem is unchanged. The problem is controlled. Pertinent negatives include no blurred vision, chest pain, headaches or palpitations. Past treatments include ACE inhibitors, diuretics and beta blockers. There is no history of kidney disease, CAD/MI or CVA.    COPD: Pt. Has long-standing history of COPD, symptoms include shortness of breath with activity. No dyspnea at rest. In the morning, she experiences nasal and sinus congestion, and some coughing. She smokes 8-10 cigarettes/day, has smoked for 40 years. She takes Symbicort and Ventolin for rescue breathing, also uses the nebulizer as needed.  Past Medical History:  Diagnosis Date  . COPD (chronic obstructive pulmonary disease) ([place])   . Hypertension     History reviewed. No pertinent surgical history.  Family History  Problem Relation Age of Onset  . Hypertension Mother     Social History   Social History  . Marital status: Widowed    Spouse name: N/A  . Number of children: N/A  . Years of education: N/A   Occupational History  . Not on file.   Social History Main Topics  . Smoking status: Current Every Day Smoker    Packs/day: 0.25    Years: 40.00    Types: Cigarettes  . Smokeless tobacco: Never Used  . Alcohol use No  . Drug use: No  . Sexual activity: No   Other Topics Concern  . Not on file    Social History Narrative  . No narrative on file     Current Outpatient Prescriptions:  .  budesonide-formoterol (SYMBICORT) 160-4.5 MCG/ACT inhaler, Inhale 2 puffs into the lungs 2 (two) times daily., Disp: 1 Inhaler, Rfl: 2 .  citalopram (CELEXA) 20 MG tablet, Take 1 tablet (20 mg total) by mouth daily., Disp: 90 tablet, Rfl: 1 .  ipratropium-albuterol (DUONEB) 0.5-2.5 (3) MG/3ML SOLN, Take 3 mLs by nebulization every 6 (six) hours as needed., Disp: 360 mL, Rfl: 0 .  lisinopril-hydrochlorothiazide (PRINZIDE,ZESTORETIC) 20-25 MG tablet, Take 1 tablet by mouth daily., Disp: 90 tablet, Rfl: 1 .  metoprolol succinate (TOPROL-XL) 25 MG 24 hr tablet, TAKE 1 TABLET (25 MG TOTAL) BY MOUTH DAILY., Disp: 90 tablet, Rfl: 1 .  omeprazole (PRILOSEC) 20 MG capsule, TAKE 1 CAPSULE (20 MG TOTAL) BY MOUTH DAILY., Disp: 90 capsule, Rfl: 0 .  VENTOLIN HFA 108 (90 Base) MCG/ACT inhaler, INHALE 2 PUFFS INTO THE LUNGS EVERY 6 (SIX) HOURS AS NEEDED FOR WHEEZING OR SHORTNESS OF BREATH., Disp: 18 Inhaler, Rfl: 2 .  nicotine (NICODERM CQ - DOSED IN MG/24 HOURS) 14 mg/24hr patch, Place 1 patch (14 mg total) onto the skin daily. (Patient not taking: Reported on 05/09/2016), Disp: 28 patch, Rfl: 0 .  predniSONE (DELTASONE) 10 MG tablet, Take 1 tablet (10 mg total) by mouth daily with breakfast. 60 mg PO (ORAL) x 2 days 50 mg  PO (ORAL)  x 2 days 40 mg PO (ORAL)  x 2 days 30 mg PO  (ORAL)  x 2 days 20 mg PO  (ORAL) x 2 days 10 mg PO  (ORAL) x 2 days then stop (Patient not taking: Reported on 09/20/2016), Disp: 42 tablet, Rfl: 0  Allergies  Allergen Reactions  . Levofloxacin Shortness Of Breath  . Penicillins Hives    itching     Review of Systems  HENT: Positive for sore throat.   Eyes: Negative for blurred vision.  Cardiovascular: Negative for chest pain and palpitations.  Gastrointestinal: Negative for abdominal pain, dysphagia and heartburn.  Neurological: Negative for headaches.      Objective  Vitals:    09/20/16 1004  BP: 123/69  Pulse: 66  Resp: 16  Temp: 98.3 F (36.8 C)  TempSrc: Oral  SpO2: 94%  Weight: 109 lb 9.6 oz (49.7 kg)  Height: 5' (1.524 m)    Physical Exam  Constitutional: She is oriented to person, place, and time and well-developed, well-nourished, and in no distress.  HENT:  Head: Normocephalic and atraumatic.  Cardiovascular: Normal rate, regular rhythm and normal heart sounds.   No murmur heard. Pulmonary/Chest: Effort normal.  Bilateral expiratory wheezing and coarse breath sounds.  Musculoskeletal:       Right ankle: She exhibits no swelling.       Left ankle: She exhibits no swelling.  Neurological: She is alert and oriented to person, place, and time.  Psychiatric: Mood, memory, affect and judgment normal.  Nursing note and vitals reviewed.    Assessment & Plan  1. Chronic bronchitis, unspecified chronic bronchitis type (HCC) Using Symbicort for maintenance, Ventolin nebulizer inhaler for rescue breathing. Stable - budesonide-formoterol (SYMBICORT) 160-4.5 MCG/ACT inhaler; Inhale 2 puffs into the lungs 2 (two) times daily.  Dispense: 1 Inhaler; Refill: 2  2. Essential hypertension BP stable on present antihypertensive therapy - metoprolol succinate (TOPROL-XL) 25 MG 24 hr tablet; TAKE 1 TABLET (25 MG TOTAL) BY MOUTH DAILY.  Dispense: 90 tablet; Refill: 1  3. Gastroesophageal reflux disease, esophagitis presence not specified Symptoms controlled on PPI - omeprazole (PRILOSEC) 20 MG capsule; Take 1 capsule (20 mg total) by mouth daily.  Dispense: 90 capsule; Refill: 1  4. Hyperglycemia  - POCT glycosylated hemoglobin (Hb A1C)  5. Tobacco abuse Patient has cut down from pack a day to 8-10 cigarettes per day, started on NicoDerm patches at 14 mg for 6 weeks, followed by 7 mg for 2 weeks. Advised to quit smoking at the onset of treatment. Follow-up in 3 months - nicotine (NICODERM CQ - DOSED IN MG/24 HOURS) 14 mg/24hr patch; Place 1 patch (14 mg  total) onto the skin daily.  Dispense: 42 patch; Refill: 0 - nicotine (NICODERM CQ - DOSED IN MG/24 HR) 7 mg/24hr patch; Place 1 patch (7 mg total) onto the skin daily.  Dispense: 14 patch; Refill: 0   Terri Burns Asad A[facility] 09/20/2016 10:18 AM

## 2016-09-27 ENCOUNTER — Encounter: Payer: Self-pay | Admitting: Family Medicine

## 2016-09-27 ENCOUNTER — Ambulatory Visit
Admission: RE | Admit: 2016-09-27 | Discharge: 2016-09-27 | Disposition: A | Discharge status: Home / Self Care | Payer: Medicare Other | Source: Ambulatory Visit | Attending: Family Medicine | Admitting: Family Medicine

## 2016-09-27 ENCOUNTER — Ambulatory Visit (INDEPENDENT_AMBULATORY_CARE_PROVIDER_SITE_OTHER): Payer: Medicare Other | Admitting: Family Medicine

## 2016-09-27 VITALS — BP 120/71 | HR 60 | Temp 97.9°F | Resp 16 | Ht 60.0 in | Wt 105.8 lb

## 2016-09-27 DIAGNOSIS — R739 Hyperglycemia, unspecified: Secondary | ICD-10-CM | POA: Insufficient documentation

## 2016-09-27 DIAGNOSIS — M79621 Pain in right upper arm: Secondary | ICD-10-CM | POA: Insufficient documentation

## 2016-09-27 DIAGNOSIS — G8929 Other chronic pain: Secondary | ICD-10-CM

## 2016-09-27 DIAGNOSIS — M25511 Pain in right shoulder: Secondary | ICD-10-CM | POA: Insufficient documentation

## 2016-09-27 DIAGNOSIS — X58XXXA Exposure to other specified factors, initial encounter: Secondary | ICD-10-CM | POA: Diagnosis not present

## 2016-09-27 DIAGNOSIS — S43001A Unspecified subluxation of right shoulder joint, initial encounter: Secondary | ICD-10-CM | POA: Insufficient documentation

## 2016-09-27 MED ORDER — MELOXICAM 15 MG PO TABS: 15.0000 mg | ORAL_TABLET | Freq: Every day | ORAL | 0 refills | Status: AC

## 2016-09-27 NOTE — Progress Notes (Signed)
Name: Terri Burns   MRN: [id number]    DOB: January 24, 1951   Date:09/27/2016       Progress Note  Subjective  Chief Complaint  Chief Complaint  Patient presents with  . Arm Pain    rt arm    Arm Pain   There was no injury mechanism (started back 5-6 months ago when she tried to grab a gallon of water from [employer] in the store.). The pain is present in the upper right arm and right shoulder. The quality of the pain is described as shooting. The pain is at a severity of 5/10 (no pain at rest but hurts when she tries to reach for something especially above her head). The pain has been intermittent since the incident. She has tried immobilization and rest (used a shoulder brace for over 2 weeks, which seems to have helped) for the symptoms.     Past Medical History:  Diagnosis Date  . COPD (chronic obstructive pulmonary disease) ([place])   . Hypertension     History reviewed. No pertinent surgical history.  Family History  Problem Relation Age of Onset  . Hypertension Mother     Social History   Social History  . Marital status: Widowed    Spouse name: N/A  . Number of children: N/A  . Years of education: N/A   Occupational History  . Not on file.   Social History Main Topics  . Smoking status: Current Every Day Smoker    Packs/day: 0.25    Years: 40.00    Types: Cigarettes  . Smokeless tobacco: Never Used  . Alcohol use No  . Drug use: No  . Sexual activity: No   Other Topics Concern  . Not on file   Social History Narrative  . No narrative on file     Current Outpatient Prescriptions:  .  budesonide-formoterol (SYMBICORT) 160-4.5 MCG/ACT inhaler, Inhale 2 puffs into the lungs 2 (two) times daily., Disp: 1 Inhaler, Rfl: 2 .  citalopram (CELEXA) 20 MG tablet, Take 1 tablet (20 mg total) by mouth daily., Disp: 90 tablet, Rfl: 1 .  ipratropium-albuterol (DUONEB) 0.5-2.5 (3) MG/3ML SOLN, Take 3 mLs by nebulization every 6 (six) hours as needed., Disp: 360 mL,  Rfl: 0 .  lisinopril-hydrochlorothiazide (PRINZIDE,ZESTORETIC) 20-25 MG tablet, Take 1 tablet by mouth daily., Disp: 90 tablet, Rfl: 1 .  metoprolol succinate (TOPROL-XL) 25 MG 24 hr tablet, TAKE 1 TABLET (25 MG TOTAL) BY MOUTH DAILY., Disp: 90 tablet, Rfl: 1 .  nicotine (NICODERM CQ - DOSED IN MG/24 HOURS) 14 mg/24hr patch, Place 1 patch (14 mg total) onto the skin daily., Disp: 42 patch, Rfl: 0 .  nicotine (NICODERM CQ - DOSED IN MG/24 HR) 7 mg/24hr patch, Place 1 patch (7 mg total) onto the skin daily., Disp: 14 patch, Rfl: 0 .  omeprazole (PRILOSEC) 20 MG capsule, Take 1 capsule (20 mg total) by mouth daily., Disp: 90 capsule, Rfl: 1 .  VENTOLIN HFA 108 (90 Base) MCG/ACT inhaler, INHALE 2 PUFFS INTO THE LUNGS EVERY 6 (SIX) HOURS AS NEEDED FOR WHEEZING OR SHORTNESS OF BREATH., Disp: 18 Inhaler, Rfl: 2 .  predniSONE (DELTASONE) 10 MG tablet, Take 1 tablet (10 mg total) by mouth daily with breakfast. 60 mg PO (ORAL) x 2 days 50 mg PO (ORAL)  x 2 days 40 mg PO (ORAL)  x 2 days 30 mg PO  (ORAL)  x 2 days 20 mg PO  (ORAL) x 2 days 10 mg PO  (  ORAL) x 2 days then stop (Patient not taking: Reported on 09/20/2016), Disp: 42 tablet, Rfl: 0  Allergies  Allergen Reactions  . Levofloxacin Shortness Of Breath  . Penicillins Hives    itching     Review of Systems  Constitutional: Negative for chills, fever and malaise/fatigue.  Musculoskeletal: Positive for joint pain.      Objective  Vitals:   09/27/16 1319  BP: 120/71  Pulse: 60  Resp: 16  Temp: 97.9 F (36.6 C)  TempSrc: Oral  SpO2: 97%  Weight: 105 lb 12.8 oz (48 kg)  Height: 5' (1.524 m)    Physical Exam  Constitutional: She is oriented to person, place, and time and well-developed, well-nourished, and in no distress.  Cardiovascular: Normal rate, regular rhythm and normal heart sounds.   No murmur heard. Pulmonary/Chest: Effort normal. No respiratory distress. She has wheezes. She has no rhonchi.  Musculoskeletal:       Right  shoulder: She exhibits tenderness, bony tenderness, pain, spasm and decreased strength.       Arms: Tenderness over the lateral right upper arm extending into the lateral and inferior shoulder joint, difficulty raising arm past midline, pain on internal and external rotation of right shoulder, no visible deformity or overlying skin changes.  Neurological: She is alert and oriented to person, place, and time.  Psychiatric: Mood, memory, affect and judgment normal.  Nursing note and vitals reviewed.    Assessment & Plan  1. Pain in right upper arm Obtain x-ray of right humerus, start on meloxicam for relief of pain and associated inflammation - DG Humerus Right; Future - meloxicam (MOBIC) 15 MG tablet; Take 1 tablet (15 mg total) by mouth daily.  Dispense: 10 tablet; Refill: 0  2. Chronic pain in right shoulder Differential includes impingement versus rotator cuff (more likely), obtain x-rays, may need an MRI for evaluation of rotator cuff pathology - DG Shoulder Right; Future   Kymberley Raz Asad A[facility] 09/27/2016 1:31 PM

## 2016-11-07 ENCOUNTER — Ambulatory Visit: Payer: Medicare Other

## 2016-11-07 ENCOUNTER — Encounter: Payer: Medicare Other | Admitting: Family Medicine

## 2016-11-11 ENCOUNTER — Other Ambulatory Visit: Payer: Self-pay | Admitting: Family Medicine

## 2016-11-11 DIAGNOSIS — I10 Essential (primary) hypertension: Secondary | ICD-10-CM

## 2016-11-29 ENCOUNTER — Other Ambulatory Visit: Payer: Self-pay | Admitting: Family Medicine

## 2016-11-29 DIAGNOSIS — F3342 Major depressive disorder, recurrent, in full remission: Secondary | ICD-10-CM

## 2016-12-11 ENCOUNTER — Other Ambulatory Visit: Payer: Self-pay | Admitting: Family Medicine

## 2016-12-11 DIAGNOSIS — J42 Unspecified chronic bronchitis: Secondary | ICD-10-CM

## 2016-12-12 MED ORDER — BUDESONIDE-FORMOTEROL FUMARATE 160-4.5 MCG/ACT IN AERO: 2.0000 | INHALATION_SPRAY | Freq: Two times a day (BID) | RESPIRATORY_TRACT | 0 refills | Status: DC

## 2017-01-11 ENCOUNTER — Other Ambulatory Visit: Payer: Self-pay | Admitting: Family Medicine

## 2017-01-11 DIAGNOSIS — J42 Unspecified chronic bronchitis: Secondary | ICD-10-CM

## 2017-02-07 ENCOUNTER — Other Ambulatory Visit: Payer: Self-pay | Admitting: Family Medicine

## 2017-02-07 DIAGNOSIS — I10 Essential (primary) hypertension: Secondary | ICD-10-CM

## 2017-02-09 ENCOUNTER — Other Ambulatory Visit: Payer: Self-pay | Admitting: Family Medicine

## 2017-02-09 DIAGNOSIS — J42 Unspecified chronic bronchitis: Secondary | ICD-10-CM

## 2017-02-09 NOTE — Telephone Encounter (Signed)
Patient requesting refill of Symbicort to [facility].  

## 2017-02-15 ENCOUNTER — Other Ambulatory Visit: Payer: Self-pay | Admitting: Family Medicine

## 2017-02-15 DIAGNOSIS — I10 Essential (primary) hypertension: Secondary | ICD-10-CM

## 2017-02-28 ENCOUNTER — Other Ambulatory Visit: Payer: Self-pay | Admitting: Family Medicine

## 2017-02-28 DIAGNOSIS — I10 Essential (primary) hypertension: Secondary | ICD-10-CM

## 2017-03-10 ENCOUNTER — Other Ambulatory Visit: Payer: Self-pay | Admitting: Family Medicine

## 2017-03-10 DIAGNOSIS — J42 Unspecified chronic bronchitis: Secondary | ICD-10-CM

## 2017-03-20 ENCOUNTER — Other Ambulatory Visit: Payer: Self-pay | Admitting: Family Medicine

## 2017-03-20 DIAGNOSIS — J42 Unspecified chronic bronchitis: Secondary | ICD-10-CM

## 2017-03-20 DIAGNOSIS — I10 Essential (primary) hypertension: Secondary | ICD-10-CM

## 2017-03-20 NOTE — Telephone Encounter (Signed)
Pt needs refill on Symbicort and Lisinopril to be sent to [address].Pt has an appt on 03/30/16

## 2017-03-22 MED ORDER — LISINOPRIL-HYDROCHLOROTHIAZIDE 20-25 MG PO TABS: 1.0000 | ORAL_TABLET | Freq: Every day | ORAL | 0 refills | Status: DC

## 2017-03-22 MED ORDER — BUDESONIDE-FORMOTEROL FUMARATE 160-4.5 MCG/ACT IN AERO: 2.0000 | INHALATION_SPRAY | Freq: Two times a day (BID) | RESPIRATORY_TRACT | 0 refills | Status: DC

## 2017-03-29 ENCOUNTER — Ambulatory Visit: Payer: Medicare Other | Admitting: Family Medicine

## 2017-03-29 ENCOUNTER — Ambulatory Visit (INDEPENDENT_AMBULATORY_CARE_PROVIDER_SITE_OTHER): Payer: Medicare Other

## 2017-03-29 ENCOUNTER — Other Ambulatory Visit: Payer: Self-pay

## 2017-03-29 ENCOUNTER — Ambulatory Visit: Payer: Self-pay

## 2017-03-29 VITALS — BP 104/70 | HR 70 | Temp 97.4°F | Resp 16 | Ht 60.0 in | Wt 104.7 lb

## 2017-03-29 VITALS — BP 104/70 | HR 70 | Temp 97.4°F | Resp 16 | Ht 60.0 in | Wt 104.0 lb

## 2017-03-29 DIAGNOSIS — Z23 Encounter for immunization: Secondary | ICD-10-CM

## 2017-03-29 DIAGNOSIS — Z1239 Encounter for other screening for malignant neoplasm of breast: Secondary | ICD-10-CM

## 2017-03-29 DIAGNOSIS — F3342 Major depressive disorder, recurrent, in full remission: Secondary | ICD-10-CM | POA: Diagnosis not present

## 2017-03-29 DIAGNOSIS — J42 Unspecified chronic bronchitis: Secondary | ICD-10-CM

## 2017-03-29 DIAGNOSIS — Z0000 Encounter for general adult medical examination without abnormal findings: Secondary | ICD-10-CM | POA: Diagnosis not present

## 2017-03-29 DIAGNOSIS — I10 Essential (primary) hypertension: Secondary | ICD-10-CM

## 2017-03-29 DIAGNOSIS — Z1159 Encounter for screening for other viral diseases: Secondary | ICD-10-CM

## 2017-03-29 DIAGNOSIS — Z1211 Encounter for screening for malignant neoplasm of colon: Secondary | ICD-10-CM | POA: Diagnosis not present

## 2017-03-29 DIAGNOSIS — K219 Gastro-esophageal reflux disease without esophagitis: Secondary | ICD-10-CM | POA: Diagnosis not present

## 2017-03-29 DIAGNOSIS — E2839 Other primary ovarian failure: Secondary | ICD-10-CM | POA: Diagnosis not present

## 2017-03-29 DIAGNOSIS — Z114 Encounter for screening for human immunodeficiency virus [HIV]: Secondary | ICD-10-CM | POA: Diagnosis not present

## 2017-03-29 DIAGNOSIS — Z1231 Encounter for screening mammogram for malignant neoplasm of breast: Secondary | ICD-10-CM | POA: Diagnosis not present

## 2017-03-29 DIAGNOSIS — K5900 Constipation, unspecified: Secondary | ICD-10-CM

## 2017-03-29 MED ORDER — OMEPRAZOLE 20 MG PO CPDR: 20.0000 mg | DELAYED_RELEASE_CAPSULE | Freq: Every day | ORAL | 1 refills | Status: DC

## 2017-03-29 MED ORDER — METOPROLOL SUCCINATE ER 25 MG PO TB24: ORAL_TABLET | ORAL | 1 refills | Status: DC

## 2017-03-29 MED ORDER — CITALOPRAM HYDROBROMIDE 20 MG PO TABS: 20.0000 mg | ORAL_TABLET | Freq: Every day | ORAL | 1 refills | Status: DC

## 2017-03-29 MED ORDER — IPRATROPIUM-ALBUTEROL 0.5-2.5 (3) MG/3ML IN SOLN: 3.0000 mL | Freq: Four times a day (QID) | RESPIRATORY_TRACT | 0 refills | Status: DC | PRN

## 2017-03-29 NOTE — Patient Instructions (Signed)
Terri Burns , Thank you for taking time to come for your Medicare Wellness Visit. I appreciate your ongoing commitment to your health goals. Please review the following plan we discussed and let me know if I can assist you in the future.   Screening recommendations/referrals: Colonoscopy: Ordered today Mammogram: Ordered today. Please call [phone] to schedule your mammogram.  Bone Density: Ordered today. Please call [phone] to schedule your bone density scan.  Recommended yearly ophthalmology/optometry visit for glaucoma screening and checkup Recommended yearly dental visit for hygiene and checkup  Vaccinations: Influenza vaccine: Given today Pneumococcal vaccine: Declined Tdap vaccine: Up to date Shingles vaccine: Declined    Advanced directives: Advance directive discussed with you today. Even though you declined this today please call our office should you change your mind and we can give you the proper paperwork for you to fill out.   Conditions/risks identified: Fall risk prevention discussed  Next appointment: Scheduled to see Dr. Manuella Ghazi 03/29/17 @ 2:40pm. Follow up in one year for annual wellness exam.   Preventive Care 65 Years and Older, Female Preventive care refers to lifestyle choices and visits with your health care provider that can promote health and wellness. What does preventive care include?  A yearly physical exam. This is also called an annual well check.  Dental exams once or twice a year.  Routine eye exams. Ask your health care provider how often you should have your eyes checked.  Personal lifestyle choices, including:  Daily care of your teeth and gums.  Regular physical activity.  Eating a healthy diet.  Avoiding tobacco and drug use.  Limiting alcohol use.  Practicing safe sex.  Taking low-dose aspirin every day.  Taking vitamin and mineral supplements as recommended by your health care provider. What happens during an annual well  check? The services and screenings done by your health care provider during your annual well check will depend on your age, overall health, lifestyle risk factors, and family history of disease. Counseling  Your health care provider may ask you questions about your:  Alcohol use.  Tobacco use.  Drug use.  Emotional well-being.  Home and relationship well-being.  Sexual activity.  Eating habits.  History of falls.  Memory and ability to understand (cognition).  Work and work Statistician.  Reproductive health. Screening  You may have the following tests or measurements:  Height, weight, and BMI.  Blood pressure.  Lipid and cholesterol levels. These may be checked every 5 years, or more frequently if you are over 71 years old.  Skin check.  Lung cancer screening. You may have this screening every year starting at age 31 if you have a 30-pack-year history of smoking and currently smoke or have quit within the past 15 years.  Fecal occult blood test (FOBT) of the stool. You may have this test every year starting at age 50.  Flexible sigmoidoscopy or colonoscopy. You may have a sigmoidoscopy every 5 years or a colonoscopy every 10 years starting at age 44.  Hepatitis C blood test.  Hepatitis B blood test.  Sexually transmitted disease (STD) testing.  Diabetes screening. This is done by checking your blood sugar (glucose) after you have not eaten for a while (fasting). You may have this done every 1-3 years.  Bone density scan. This is done to screen for osteoporosis. You may have this done starting at age 48.  Mammogram. This may be done every 1-2 years. Talk to your health care provider about how often you should have  regular mammograms. Talk with your health care provider about your test results, treatment options, and if necessary, the need for more tests. Vaccines  Your health care provider may recommend certain vaccines, such as:  Influenza vaccine. This is  recommended every year.  Tetanus, diphtheria, and acellular pertussis (Tdap, Td) vaccine. You may need a Td booster every 10 years.  Zoster vaccine. You may need this after age 28.  Pneumococcal 13-valent conjugate (PCV13) vaccine. One dose is recommended after age 55.  Pneumococcal polysaccharide (PPSV23) vaccine. One dose is recommended after age 57. Talk to your health care provider about which screenings and vaccines you need and how often you need them. This information is not intended to replace advice given to you by your health care provider. Make sure you discuss any questions you have with your health care provider. Document Released: 06/19/2015 Document Revised: 02/10/2016 Document Reviewed: 03/24/2015 Elsevier Interactive Patient Education  2017 [place] Prevention in the Home Falls can cause injuries. They can happen to people of all ages. There are many things you can do to make your home safe and to help prevent falls. What can I do on the outside of my home?  Regularly fix the edges of walkways and driveways and fix any cracks.  Remove anything that might make you trip as you walk through a door, such as a raised step or threshold.  Trim any bushes or trees on the path to your home.  Use bright outdoor lighting.  Clear any walking paths of anything that might make someone trip, such as rocks or tools.  Regularly check to see if handrails are loose or broken. Make sure that both sides of any steps have handrails.  Any raised decks and porches should have guardrails on the edges.  Have any leaves, snow, or ice cleared regularly.  Use sand or salt on walking paths during winter.  Clean up any spills in your garage right away. This includes oil or grease spills. What can I do in the bathroom?  Use night lights.  Install grab bars by the toilet and in the tub and shower. Do not use towel bars as grab bars.  Use non-skid mats or decals in the tub or  shower.  If you need to sit down in the shower, use a plastic, non-slip stool.  Keep the floor dry. Clean up any water that spills on the floor as soon as it happens.  Remove soap buildup in the tub or shower regularly.  Attach bath mats securely with double-sided non-slip rug tape.  Do not have throw rugs and other things on the floor that can make you trip. What can I do in the bedroom?  Use night lights.  Make sure that you have a light by your bed that is easy to reach.  Do not use any sheets or blankets that are too big for your bed. They should not hang down onto the floor.  Have a firm chair that has side arms. You can use this for support while you get dressed.  Do not have throw rugs and other things on the floor that can make you trip. What can I do in the kitchen?  Clean up any spills right away.  Avoid walking on wet floors.  Keep items that you use a lot in easy-to-reach places.  If you need to reach something above you, use a strong step stool that has a grab bar.  Keep electrical cords out of the  way.  Do not use floor polish or wax that makes floors slippery. If you must use wax, use non-skid floor wax.  Do not have throw rugs and other things on the floor that can make you trip. What can I do with my stairs?  Do not leave any items on the stairs.  Make sure that there are handrails on both sides of the stairs and use them. Fix handrails that are broken or loose. Make sure that handrails are as long as the stairways.  Check any carpeting to make sure that it is firmly attached to the stairs. Fix any carpet that is loose or worn.  Avoid having throw rugs at the top or bottom of the stairs. If you do have throw rugs, attach them to the floor with carpet tape.  Make sure that you have a light switch at the top of the stairs and the bottom of the stairs. If you do not have them, ask someone to add them for you. What else can I do to help prevent  falls?  Wear shoes that:  Do not have high heels.  Have rubber bottoms.  Are comfortable and fit you well.  Are closed at the toe. Do not wear sandals.  If you use a stepladder:  Make sure that it is fully opened. Do not climb a closed stepladder.  Make sure that both sides of the stepladder are locked into place.  Ask someone to hold it for you, if possible.  Clearly mark and make sure that you can see:  Any grab bars or handrails.  First and last steps.  Where the edge of each step is.  Use tools that help you move around (mobility aids) if they are needed. These include:  Canes.  Walkers.  Scooters.  Crutches.  Turn on the lights when you go into a dark area. Replace any light bulbs as soon as they burn out.  Set up your furniture so you have a clear path. Avoid moving your furniture around.  If any of your floors are uneven, fix them.  If there are any pets around you, be aware of where they are.  Review your medicines with your doctor. Some medicines can make you feel dizzy. This can increase your chance of falling. Ask your doctor what other things that you can do to help prevent falls. This information is not intended to replace advice given to you by your health care provider. Make sure you discuss any questions you have with your health care provider. Document Released: 03/19/2009 Document Revised: 10/29/2015 Document Reviewed: 06/27/2014 Elsevier Interactive Patient Education  2017 [employer].

## 2017-03-29 NOTE — Progress Notes (Signed)
Name: Terri Burns   MRN: [id number]    DOB: 03-Nov-1950   Date:03/29/2017       Progress Note  Subjective  Chief Complaint  Chief Complaint  Patient presents with  . Follow-up    2nd part of medicare  . Medication Refill    toprol and Acid reflux med    Hypertension  This is a chronic problem. The problem is unchanged. The problem is controlled. Pertinent negatives include no blurred vision, chest pain, headaches, orthopnea, palpitations or sweats. Past treatments include ACE inhibitors, diuretics and beta blockers. There is no history of kidney disease, CAD/MI or CVA.  Gastroesophageal Reflux  She complains of nausea. She reports no abdominal pain, no belching, no chest pain or no heartburn. This is a chronic problem. The symptoms are aggravated by certain foods (spicy foods, whipped cream). Pertinent negatives include no fatigue. She has tried a PPI for the symptoms. Past procedures do not include an EGD.  Depression       The patient presents with depression.  This is a chronic problem.  The onset quality is gradual. The problem is unchanged.  Associated symptoms include no fatigue, no helplessness, no hopelessness, no appetite change, no headaches, not sad and no suicidal ideas.  Past treatments include SSRIs - Selective serotonin reuptake inhibitors.  Compliance with treatment is good.  Past medical history includes depression.      Past Medical History:  Diagnosis Date  . COPD (chronic obstructive pulmonary disease) ([place])   . Depression   . GERD (gastroesophageal reflux disease)   . Hyperlipidemia   . Hypertension     Past Surgical History:  Procedure Laterality Date  . NECK SURGERY N/A 10/2003    Family History  Problem Relation Age of Onset  . Hypertension Mother     Social History   Social History  . Marital status: Widowed    Spouse name: N/A  . Number of children: N/A  . Years of education: N/A   Occupational History  . Not on file.   Social History  Main Topics  . Smoking status: Current Every Day Smoker    Packs/day: 0.25    Years: 40.00    Types: Cigarettes  . Smokeless tobacco: Never Used  . Alcohol use No  . Drug use: No  . Sexual activity: No   Other Topics Concern  . Not on file   Social History Narrative  . No narrative on file     Current Outpatient Prescriptions:  .  budesonide-formoterol (SYMBICORT) 160-4.5 MCG/ACT inhaler, Inhale 2 puffs into the lungs 2 (two) times daily., Disp: 10.2 Inhaler, Rfl: 0 .  citalopram (CELEXA) 20 MG tablet, Take 1 tablet (20 mg total) by mouth daily., Disp: 90 tablet, Rfl: 1 .  ipratropium-albuterol (DUONEB) 0.5-2.5 (3) MG/3ML SOLN, Take 3 mLs by nebulization every 6 (six) hours as needed., Disp: 360 mL, Rfl: 0 .  lisinopril-hydrochlorothiazide (PRINZIDE,ZESTORETIC) 20-25 MG tablet, Take 1 tablet by mouth daily., Disp: 90 tablet, Rfl: 0 .  metoprolol succinate (TOPROL-XL) 25 MG 24 hr tablet, TAKE 1 TABLET (25 MG TOTAL) BY MOUTH DAILY., Disp: 90 tablet, Rfl: 1 .  omeprazole (PRILOSEC) 20 MG capsule, Take 1 capsule (20 mg total) by mouth daily., Disp: 90 capsule, Rfl: 1 .  VENTOLIN HFA 108 (90 Base) MCG/ACT inhaler, INHALE 2 PUFFS INTO THE LUNGS EVERY 6 (SIX) HOURS AS NEEDED FOR WHEEZING OR SHORTNESS OF BREATH. (Patient not taking: Reported on 03/29/2017), Disp: 18 Inhaler, Rfl: 2  Allergies  Allergen Reactions  . Levofloxacin Shortness Of Breath  . Penicillins Hives    itching     Review of Systems  Constitutional: Negative for appetite change and fatigue.  Eyes: Negative for blurred vision.  Cardiovascular: Negative for chest pain, palpitations and orthopnea.  Gastrointestinal: Positive for nausea. Negative for abdominal pain and heartburn.  Neurological: Negative for headaches.  Psychiatric/Behavioral: Positive for depression. Negative for suicidal ideas.    Objective  Vitals:   03/29/17 1500  BP: 104/70  Pulse: 70  Resp: 16  Temp: (!) 97.4 F (36.3 C)  TempSrc: Oral   SpO2: 98%  Weight: 104 lb (47.2 kg)  Height: 5' (1.524 m)    Physical Exam  Constitutional: She is oriented to person, place, and time and well-developed, well-nourished, and in no distress.  HENT:  Head: Normocephalic and atraumatic.  Cardiovascular: Normal rate, regular rhythm and normal heart sounds.   No murmur heard. Pulmonary/Chest: Effort normal and breath sounds normal. She has no wheezes.  Neurological: She is alert and oriented to person, place, and time.  Psychiatric: Mood, memory, affect and judgment normal.  Nursing note and vitals reviewed.     Assessment & Plan  1. Flu vaccine need  - Flu vaccine HIGH DOSE PF (Fluzone High dose)  2. Chronic bronchitis, unspecified chronic bronchitis type ([place])  - ipratropium-albuterol (DUONEB) 0.5-2.5 (3) MG/3ML SOLN; Take 3 mLs by nebulization every 6 (six) hours as needed.  Dispense: 360 mL; Refill: 0  3. Essential hypertension  - metoprolol succinate (TOPROL-XL) 25 MG 24 hr tablet; TAKE 1 TABLET (25 MG TOTAL) BY MOUTH DAILY.  Dispense: 90 tablet; Refill: 1  4. Gastroesophageal reflux disease, esophagitis presence not specified  - omeprazole (PRILOSEC) 20 MG capsule; Take 1 capsule (20 mg total) by mouth daily.  Dispense: 90 capsule; Refill: 1  5. Recurrent major depressive disorder, in full remission ([place])  - citalopram (CELEXA) 20 MG tablet; Take 1 tablet (20 mg total) by mouth daily.  Dispense: 90 tablet; Refill: 1   Artesia Berkey Asad A. [facility] 03/29/2017 3:30 PM

## 2017-03-29 NOTE — Progress Notes (Signed)
Subjective:   Terri Burns is a 66 y.o. female who presents for Medicare Annual (Subsequent) preventive examination.  Review of Systems:  N/A Cardiac Risk Factors include: advanced age (>25men, >82 women);hypertension;smoking/ tobacco exposure;sedentary lifestyle     Objective:     Vitals: BP 104/70 (BP Location: Left Arm, Patient Position: Sitting, Cuff Size: Normal)   Pulse 70   Temp (!) 97.4 F (36.3 C) (Oral)   Resp 16   Ht 5' (1.524 m)   Wt 104 lb 11.2 oz (47.5 kg)   BMI 20.45 kg/m   Body mass index is 20.45 kg/m.   Tobacco History  Smoking Status  . Current Every Day Smoker  . Packs/day: 0.25  . Years: 40.00  . Types: Cigarettes  Smokeless Tobacco  . Never Used     Ready to quit: No Counseling given: Yes   Past Medical History:  Diagnosis Date  . COPD (chronic obstructive pulmonary disease) ([place])   . Depression   . GERD (gastroesophageal reflux disease)   . Hyperlipidemia   . Hypertension    Past Surgical History:  Procedure Laterality Date  . NECK SURGERY N/A 10/2003   Family History  Problem Relation Age of Onset  . Hypertension Mother    History  Sexual Activity  . Sexual activity: No    Outpatient Encounter Prescriptions as of 03/29/2017  Medication Sig  . budesonide-formoterol (SYMBICORT) 160-4.5 MCG/ACT inhaler Inhale 2 puffs into the lungs 2 (two) times daily.  . citalopram (CELEXA) 20 MG tablet Take 1 tablet (20 mg total) by mouth daily.  Marland Kitchen ipratropium-albuterol (DUONEB) 0.5-2.5 (3) MG/3ML SOLN Take 3 mLs by nebulization every 6 (six) hours as needed.  Marland Kitchen lisinopril-hydrochlorothiazide (PRINZIDE,ZESTORETIC) 20-25 MG tablet Take 1 tablet by mouth daily.  . metoprolol succinate (TOPROL-XL) 25 MG 24 hr tablet TAKE 1 TABLET (25 MG TOTAL) BY MOUTH DAILY.  Marland Kitchen omeprazole (PRILOSEC) 20 MG capsule Take 1 capsule (20 mg total) by mouth daily.  . VENTOLIN HFA 108 (90 Base) MCG/ACT inhaler INHALE 2 PUFFS INTO THE LUNGS EVERY 6 (SIX) HOURS AS  NEEDED FOR WHEEZING OR SHORTNESS OF BREATH. (Patient not taking: Reported on 03/29/2017)  . [DISCONTINUED] predniSONE (DELTASONE) 10 MG tablet Take 1 tablet (10 mg total) by mouth daily with breakfast. 60 mg PO (ORAL) x 2 days 50 mg PO (ORAL)  x 2 days 40 mg PO (ORAL)  x 2 days 30 mg PO  (ORAL)  x 2 days 20 mg PO  (ORAL) x 2 days 10 mg PO  (ORAL) x 2 days then stop (Patient not taking: Reported on 09/20/2016)   No facility-administered encounter medications on file as of 03/29/2017.     Activities of Daily Living In your present state of health, do you have any difficulty performing the following activities: 03/29/2017 09/27/2016  Hearing? N N  Vision? N N  Difficulty concentrating or making decisions? N N  Walking or climbing stairs? N N  Dressing or bathing? N N  Doing errands, shopping? N N  Preparing Food and eating ? N -  Using the Toilet? N -  In the past six months, have you accidently leaked urine? Y -  Comment stress incontinence -  Do you have problems with loss of bowel control? N -  Managing your Medications? N -  Managing your Finances? N -  Housekeeping or managing your Housekeeping? N -  Some recent data might be hidden    Patient Care Team: Roselee Nova, MD as  PCP - General (Family Medicine)    Assessment:     Exercise Activities and Dietary recommendations Current Exercise Habits: The patient does not participate in regular exercise at present, Exercise limited by: orthopedic condition(s) (pain in L shoulder)  Goals    . Quit smoking / using tobacco          Recommend to attend smoking cessation classes with [facility] to discuss smoking cessation      Fall Risk: Timed up and go - 22.7 seconds Fall Risk  03/29/2017 09/27/2016 09/20/2016 05/09/2016 11/20/2015  Falls in the past year? No No No No No  Risk for fall due to : Impaired balance/gait - - - -  Risk for fall due to: Comment staggered gait - - - -   Depression Screen PHQ 2/9 Scores 03/29/2017  09/27/2016 09/20/2016 05/09/2016  PHQ - 2 Score 0 0 0 0     Cognitive Function: declined        Immunization History  Administered Date(s) Administered  . Influenza,inj,Quad PF,6+ Mos 05/12/2015, 05/09/2016   Screening Tests Health Maintenance  Topic Date Due  . Hepatitis C Screening  [birth year]  . HIV Screening  01/14/1967  . MAMMOGRAM  01/13/2002  . COLONOSCOPY  01/13/2002  . PAP SMEAR  07/08/2016  . INFLUENZA VACCINE  01/04/2017  . DEXA SCAN  01/13/2017  . PNA vac Low Risk Adult (1 of 2 - PCV13) 01/13/2017  . TETANUS/TDAP  05/28/2023      Plan:    I have personally reviewed and addressed the Medicare Annual Wellness questionnaire and have noted the following in the patient's chart:  A. Medical and social history B. Use of alcohol, tobacco or illicit drugs  C. Current medications and supplements D. Functional ability and status E.  Nutritional status F.  Physical activity G. Advance directives and Code Status: Pt given documents for review and completion H. List of other physicians I.  Hospitalizations, surgeries, and ER visits in previous 12 months J.  [place] such as hearing and vision if needed, cognitive and depression L. Referrals and appointments - none  In addition, I have reviewed and discussed with patient certain preventive protocols, quality metrics, and best practice recommendations. A written personalized care plan for preventive services as well as general preventive health recommendations were provided to patient.  See attached scanned questionnaire for additional information.   Signed,  Aleatha Borer, LPN Nurse Health Advisor   Recommendations for Immunizations per CDC guidelines:  Vaccinations: Influenza vaccine: States she received her vaccine at the office today PRIOR to this visit Pneumococcal vaccine: Pt requested to schedule an appt for next month to receive vaccine. Tdap vaccine: Up to date Shingles vaccine: Declined. Pt  was advised to check with insurance re: out of pocket expense. Advised she may also receive this vaccine at her local pharmacy or Health Dept.   Recommendations for Health Maintenance Screenings:  Screenings: Colonoscopy: Ordered today Mammogram: Ordered today Bone Density: Ordered today HIV Screening: Ordered today Hep C Screening: Ordered today Recommended yearly ophthalmology/optometry visit for glaucoma screening and checkup Recommended yearly dental visit for hygiene and checkup

## 2017-05-01 ENCOUNTER — Encounter: Payer: Medicare Other | Admitting: Family Medicine

## 2017-05-08 ENCOUNTER — Other Ambulatory Visit: Payer: Self-pay | Admitting: Family Medicine

## 2017-05-08 DIAGNOSIS — J42 Unspecified chronic bronchitis: Secondary | ICD-10-CM

## 2017-05-12 ENCOUNTER — Telehealth: Payer: Self-pay | Admitting: Family Medicine

## 2017-05-12 ENCOUNTER — Other Ambulatory Visit: Payer: Self-pay | Admitting: Family Medicine

## 2017-05-12 DIAGNOSIS — J42 Unspecified chronic bronchitis: Secondary | ICD-10-CM

## 2017-05-12 NOTE — Telephone Encounter (Signed)
Rx has been approved by Dr. Manuella Ghazi today at 11:32am. confirmation receipt was received by pharmacy.

## 2017-05-12 NOTE — Telephone Encounter (Signed)
Copied from [place] [id number]. Topic: Quick Communication - See Telephone Encounter >> May 12, 2017  9:10 AM Ahmed Prima L wrote: CRM for notification. See Telephone encounter for:  Pt said she contacted her pharmacy 2 weeks ago for a refill on her inhaler SYMBICORT, they stated with her they have not received anything. Please advise. [facility] [address]  05/12/17.

## 2017-05-24 ENCOUNTER — Other Ambulatory Visit: Payer: Self-pay | Admitting: Family Medicine

## 2017-05-24 DIAGNOSIS — J42 Unspecified chronic bronchitis: Secondary | ICD-10-CM

## 2017-06-08 ENCOUNTER — Other Ambulatory Visit: Payer: Self-pay | Admitting: Family Medicine

## 2017-06-08 DIAGNOSIS — J42 Unspecified chronic bronchitis: Secondary | ICD-10-CM

## 2017-06-29 ENCOUNTER — Encounter: Payer: Self-pay | Admitting: Family Medicine

## 2017-06-29 ENCOUNTER — Ambulatory Visit (INDEPENDENT_AMBULATORY_CARE_PROVIDER_SITE_OTHER): Payer: Medicare Other | Admitting: Family Medicine

## 2017-06-29 DIAGNOSIS — K219 Gastro-esophageal reflux disease without esophagitis: Secondary | ICD-10-CM

## 2017-06-29 DIAGNOSIS — F3342 Major depressive disorder, recurrent, in full remission: Secondary | ICD-10-CM

## 2017-06-29 DIAGNOSIS — I10 Essential (primary) hypertension: Secondary | ICD-10-CM | POA: Diagnosis not present

## 2017-06-29 DIAGNOSIS — J42 Unspecified chronic bronchitis: Secondary | ICD-10-CM | POA: Diagnosis not present

## 2017-06-29 MED ORDER — CITALOPRAM HYDROBROMIDE 20 MG PO TABS: 20.0000 mg | ORAL_TABLET | Freq: Every day | ORAL | 1 refills | Status: DC

## 2017-06-29 MED ORDER — LISINOPRIL-HYDROCHLOROTHIAZIDE 20-25 MG PO TABS: 1.0000 | ORAL_TABLET | Freq: Every day | ORAL | 0 refills | Status: DC

## 2017-06-29 MED ORDER — ALBUTEROL SULFATE HFA 108 (90 BASE) MCG/ACT IN AERS: 2.0000 | INHALATION_SPRAY | Freq: Four times a day (QID) | RESPIRATORY_TRACT | 2 refills | Status: DC | PRN

## 2017-06-29 MED ORDER — PANTOPRAZOLE SODIUM 40 MG PO TBEC
40.0000 mg | DELAYED_RELEASE_TABLET | Freq: Every day | ORAL | 0 refills | Status: DC
Start: 2017-06-29 — End: 2017-10-04

## 2017-06-29 MED ORDER — METOPROLOL SUCCINATE ER 25 MG PO TB24: ORAL_TABLET | ORAL | 1 refills | Status: DC

## 2017-06-29 NOTE — Progress Notes (Signed)
Name: Terri Burns   MRN: [id number]    DOB: [birth year]   Date:06/29/2017       Progress Note  Subjective  Chief Complaint  Chief Complaint  Patient presents with  . Gastroesophageal Reflux  . Hypertension    Pt denies any issues   . Medication Refill    Gastroesophageal Reflux  She complains of abdominal pain, belching, coughing, dysphagia, heartburn and nausea. She reports no chest pain. This is a recurrent problem. The problem has been gradually worsening (she has symptoms as listed above at times.). The symptoms are aggravated by certain foods (tomatoes, citrus, oily foods etc.). Pertinent negatives include no anemia, fatigue or melena. She has tried a PPI for the symptoms. The treatment provided moderate relief. Past procedures do not include an EGD or esophageal manometry.  Hypertension  This is a chronic problem. The problem is unchanged. The problem is controlled. Pertinent negatives include no blurred vision, chest pain, headaches or palpitations. Past treatments include ACE inhibitors, beta blockers and diuretics. There is no history of kidney disease, CAD/MI or CVA.  Depression       The patient presents with depression.  This is a chronic problem.The problem is unchanged.  Associated symptoms include no fatigue, no helplessness, no hopelessness, not irritable, no headaches and not sad.  Past treatments include SSRIs - Selective serotonin reuptake inhibitors.  Past medical history includes depression.     Past Medical History:  Diagnosis Date  . COPD (chronic obstructive pulmonary disease) ([place])   . Depression   . GERD (gastroesophageal reflux disease)   . Hyperlipidemia   . Hypertension     Past Surgical History:  Procedure Laterality Date  . NECK SURGERY N/A 10/2003    Family History  Problem Relation Age of Onset  . Hypertension Mother     Social History   Socioeconomic History  . Marital status: Widowed    Spouse name: Not on file  . Number of children:  Not on file  . Years of education: Not on file  . Highest education level: Not on file  Social Needs  . Financial resource strain: Not on file  . Food insecurity - worry: Not on file  . Food insecurity - inability: Not on file  . Transportation needs - medical: Not on file  . Transportation needs - non-medical: Not on file  Occupational History  . Not on file  Tobacco Use  . Smoking status: Current Every Day Smoker    Packs/day: 0.25    Years: 40.00    Pack years: 10.00    Types: Cigarettes  . Smokeless tobacco: Never Used  Substance and Sexual Activity  . Alcohol use: No    Alcohol/week: 0.0 oz  . Drug use: No  . Sexual activity: No  Other Topics Concern  . Not on file  Social History Narrative  . Not on file     Current Outpatient Medications:  .  citalopram (CELEXA) 20 MG tablet, Take 1 tablet (20 mg total) by mouth daily., Disp: 90 tablet, Rfl: 1 .  ipratropium-albuterol (DUONEB) 0.5-2.5 (3) MG/3ML SOLN, TAKE 3 MLS BY NEBULIZATION EVERY 6 (SIX) HOURS AS NEEDED., Disp: 360 mL, Rfl: 0 .  lisinopril-hydrochlorothiazide (PRINZIDE,ZESTORETIC) 20-25 MG tablet, Take 1 tablet by mouth daily., Disp: 90 tablet, Rfl: 0 .  metoprolol succinate (TOPROL-XL) 25 MG 24 hr tablet, TAKE 1 TABLET (25 MG TOTAL) BY MOUTH DAILY., Disp: 90 tablet, Rfl: 1 .  omeprazole (PRILOSEC) 20 MG capsule, Take 1 capsule (20  mg total) by mouth daily., Disp: 90 capsule, Rfl: 1 .  SYMBICORT 160-4.5 MCG/ACT inhaler, TAKE 2 PUFFS BY MOUTH TWICE A DAY, Disp: 10.2 Inhaler, Rfl: 0 .  VENTOLIN HFA 108 (90 Base) MCG/ACT inhaler, INHALE 2 PUFFS INTO THE LUNGS EVERY 6 (SIX) HOURS AS NEEDED FOR WHEEZING OR SHORTNESS OF BREATH., Disp: 18 Inhaler, Rfl: 2  Allergies  Allergen Reactions  . Levofloxacin Shortness Of Breath  . Penicillins Hives    itching     Review of Systems  Constitutional: Negative for fatigue.  Eyes: Negative for blurred vision.  Respiratory: Positive for cough.   Cardiovascular: Negative for  chest pain and palpitations.  Gastrointestinal: Positive for abdominal pain, dysphagia, heartburn and nausea. Negative for melena.  Neurological: Negative for headaches.  Psychiatric/Behavioral: Positive for depression.      Objective  Vitals:   06/29/17 1406  BP: 110/72  Pulse: 89  Resp: 16  Temp: 98.2 F (36.8 C)  TempSrc: Oral  SpO2: 96%  Weight: 108 lb 3.2 oz (49.1 kg)    Physical Exam  Constitutional: She is oriented to person, place, and time and well-developed, well-nourished, and in no distress. She is not irritable.  HENT:  Head: Normocephalic and atraumatic.  Cardiovascular: Normal rate, regular rhythm and normal heart sounds.  No murmur heard. Pulmonary/Chest: Effort normal and breath sounds normal. She has no wheezes.  Neurological: She is alert and oriented to person, place, and time.  Psychiatric: Mood, memory, affect and judgment normal.  Nursing note and vitals reviewed.    Assessment & Plan  1. Essential hypertension BP stable on present treatment - lisinopril-hydrochlorothiazide (PRINZIDE,ZESTORETIC) 20-25 MG tablet; Take 1 tablet by mouth daily.  Dispense: 90 tablet; Refill: 0 - metoprolol succinate (TOPROL-XL) 25 MG 24 hr tablet; TAKE 1 TABLET (25 MG TOTAL) BY MOUTH DAILY.  Dispense: 90 tablet; Refill: 1  2. Gastroesophageal reflux disease, esophagitis presence not specified Because of worsening reflux symptoms, we will change from omeprazole to pantoprazole, advised to follow up and if her symptoms still persist, she will need an endoscopy - pantoprazole (PROTONIX) 40 MG tablet; Take 1 tablet (40 mg total) by mouth daily.  Dispense: 90 tablet; Refill: 0  3. Recurrent major depressive disorder, in full remission ([place])  - citalopram (CELEXA) 20 MG tablet; Take 1 tablet (20 mg total) by mouth daily.  Dispense: 90 tablet; Refill: 1  4. Chronic bronchitis, unspecified chronic bronchitis type ([place]) She takes DuoNeb and Symbicort inhalers for COPD,  refill for albuterol provided - albuterol (VENTOLIN HFA) 108 (90 Base) MCG/ACT inhaler; Inhale 2 puffs into the lungs every 6 (six) hours as needed for wheezing or shortness of breath.  Dispense: 18 Inhaler; Refill: 2   Hanna [facility] 06/29/2017 2:31 PM

## 2017-06-30 ENCOUNTER — Encounter: Payer: Self-pay | Admitting: *Deleted

## 2017-07-08 ENCOUNTER — Other Ambulatory Visit: Payer: Self-pay | Admitting: Family Medicine

## 2017-07-08 DIAGNOSIS — J42 Unspecified chronic bronchitis: Secondary | ICD-10-CM

## 2017-08-08 ENCOUNTER — Other Ambulatory Visit: Payer: Self-pay

## 2017-08-08 DIAGNOSIS — J42 Unspecified chronic bronchitis: Secondary | ICD-10-CM

## 2017-08-08 MED ORDER — BUDESONIDE-FORMOTEROL FUMARATE 160-4.5 MCG/ACT IN AERO: INHALATION_SPRAY | RESPIRATORY_TRACT | 2 refills | Status: DC

## 2017-08-08 NOTE — Telephone Encounter (Signed)
[facility] request for general medication: [facility]   Last office visit 06/29/2017  Follow up on 10/27/2017

## 2017-10-04 ENCOUNTER — Other Ambulatory Visit: Payer: Self-pay

## 2017-10-04 DIAGNOSIS — K219 Gastro-esophageal reflux disease without esophagitis: Secondary | ICD-10-CM

## 2017-10-04 DIAGNOSIS — I10 Essential (primary) hypertension: Secondary | ICD-10-CM

## 2017-10-04 MED ORDER — PANTOPRAZOLE SODIUM 40 MG PO TBEC: 40.0000 mg | DELAYED_RELEASE_TABLET | Freq: Every day | ORAL | 0 refills | Status: DC

## 2017-10-04 MED ORDER — LISINOPRIL-HYDROCHLOROTHIAZIDE 20-25 MG PO TABS: 1.0000 | ORAL_TABLET | Freq: Every day | ORAL | 0 refills | Status: DC

## 2017-10-04 NOTE — Telephone Encounter (Signed)
Refill request for Hypertension medication:  Lisinopril-HCTZ 20-25 mg  Last office visit pertaining to hypertension: 06/29/2017  BP Readings from Last 3 Encounters:  06/29/17 110/72  03/29/17 104/70  03/29/17 104/70     Lab Results  Component Value Date   CREATININE 0.60 05/01/2016   BUN 19 05/01/2016   NA 134 (L) 05/01/2016   K 3.5 05/01/2016   CL 102 05/01/2016   CO2 23 05/01/2016    Follow-ups on file. None indicated

## 2017-10-27 ENCOUNTER — Ambulatory Visit: Payer: Medicare Other | Admitting: Family Medicine

## 2017-11-03 ENCOUNTER — Telehealth: Payer: Self-pay | Admitting: Family Medicine

## 2017-11-03 DIAGNOSIS — J42 Unspecified chronic bronchitis: Secondary | ICD-10-CM

## 2017-11-03 NOTE — Telephone Encounter (Signed)
Spoke with pt and she did not realize that she missed her appt. It was missed because she did not get her reminder, however she did sch next month with Benjamine Mola

## 2017-11-03 NOTE — Telephone Encounter (Signed)
Needs follow up, sending one month

## 2017-11-04 ENCOUNTER — Other Ambulatory Visit: Payer: Self-pay | Admitting: Family Medicine

## 2017-11-04 DIAGNOSIS — K219 Gastro-esophageal reflux disease without esophagitis: Secondary | ICD-10-CM

## 2017-11-13 ENCOUNTER — Other Ambulatory Visit: Payer: Self-pay

## 2017-11-13 DIAGNOSIS — K219 Gastro-esophageal reflux disease without esophagitis: Secondary | ICD-10-CM

## 2017-11-13 NOTE — Telephone Encounter (Signed)
Refill request for general medication. Protonix  Last office visit 06/29/17   Follow up on 11/22/17

## 2017-11-14 MED ORDER — PANTOPRAZOLE SODIUM 40 MG PO TBEC: 40.0000 mg | DELAYED_RELEASE_TABLET | Freq: Every day | ORAL | 0 refills | Status: DC

## 2017-11-22 ENCOUNTER — Ambulatory Visit (INDEPENDENT_AMBULATORY_CARE_PROVIDER_SITE_OTHER): Payer: Medicare Other | Admitting: Nurse Practitioner

## 2017-11-22 ENCOUNTER — Encounter: Payer: Self-pay | Admitting: Nurse Practitioner

## 2017-11-22 VITALS — BP 120/80 | HR 69 | Temp 98.4°F | Resp 16 | Ht 60.0 in | Wt 121.1 lb

## 2017-11-22 DIAGNOSIS — Z1159 Encounter for screening for other viral diseases: Secondary | ICD-10-CM

## 2017-11-22 DIAGNOSIS — Z122 Encounter for screening for malignant neoplasm of respiratory organs: Secondary | ICD-10-CM | POA: Diagnosis not present

## 2017-11-22 DIAGNOSIS — M5030 Other cervical disc degeneration, unspecified cervical region: Secondary | ICD-10-CM

## 2017-11-22 DIAGNOSIS — Z114 Encounter for screening for human immunodeficiency virus [HIV]: Secondary | ICD-10-CM

## 2017-11-22 DIAGNOSIS — J449 Chronic obstructive pulmonary disease, unspecified: Secondary | ICD-10-CM | POA: Diagnosis not present

## 2017-11-22 DIAGNOSIS — I10 Essential (primary) hypertension: Secondary | ICD-10-CM | POA: Diagnosis not present

## 2017-11-22 DIAGNOSIS — F3342 Major depressive disorder, recurrent, in full remission: Secondary | ICD-10-CM | POA: Diagnosis not present

## 2017-11-22 DIAGNOSIS — Z1211 Encounter for screening for malignant neoplasm of colon: Secondary | ICD-10-CM

## 2017-11-22 DIAGNOSIS — Z13220 Encounter for screening for lipoid disorders: Secondary | ICD-10-CM

## 2017-11-22 DIAGNOSIS — Z720 Tobacco use: Secondary | ICD-10-CM

## 2017-11-22 DIAGNOSIS — K219 Gastro-esophageal reflux disease without esophagitis: Secondary | ICD-10-CM

## 2017-11-22 DIAGNOSIS — F17210 Nicotine dependence, cigarettes, uncomplicated: Secondary | ICD-10-CM

## 2017-11-22 DIAGNOSIS — J3089 Other allergic rhinitis: Secondary | ICD-10-CM | POA: Diagnosis not present

## 2017-11-22 MED ORDER — ALBUTEROL SULFATE HFA 108 (90 BASE) MCG/ACT IN AERS: 2.0000 | INHALATION_SPRAY | Freq: Four times a day (QID) | RESPIRATORY_TRACT | 2 refills | Status: DC | PRN

## 2017-11-22 MED ORDER — TIZANIDINE HCL 2 MG PO TABS: 2.0000 mg | ORAL_TABLET | Freq: Every evening | ORAL | 0 refills | Status: DC

## 2017-11-22 MED ORDER — LEVOCETIRIZINE DIHYDROCHLORIDE 2.5 MG/5ML PO SOLN: 2.5000 mg | Freq: Every evening | ORAL | 0 refills | Status: DC

## 2017-11-22 MED ORDER — BUDESONIDE-FORMOTEROL FUMARATE 160-4.5 MCG/ACT IN AERO: INHALATION_SPRAY | RESPIRATORY_TRACT | 2 refills | Status: DC

## 2017-11-22 NOTE — Progress Notes (Addendum)
Name: Terri Burns   MRN: [id number]    DOB: 15-Jul-1950   Date:11/22/2017       Progress Note  Subjective  Chief Complaint  Chief Complaint  Patient presents with  . Follow-up    3 month rehceck  . Allergic Rhinitis     runny nose, sneezing, wattery eyes, tickle in throat    HPI  Hypertension: taking lisinipril 20mg , HCTZ 25mg , metoprolol 25mg . No missed doses in the last month, moderate salt in diet. Eat 2-3 fruits and veggies a dau    BP Readings from Last 3 Encounters:  11/22/17 120/80  06/29/17 110/72  03/29/17 104/70    COPD: taking Symbicort daily and albuterol as needed. Still smoking- 0.5 ppd   GERD: taking pantoprazole every day, sts works really well for her. Anything with acid- citrus and tomatoes   Depression Taking celexa 20mg  daily; states tiredness is mostly from neck pain states lay down. Takes ibuprofen for pain and lays down.   Depression screen PHQ 2/9 11/22/2017  Decreased Interest 1  Down, Depressed, Hopeless 0  PHQ - 2 Score 1  Altered sleeping 0  Tired, decreased energy 2  Change in appetite 1  Feeling bad or failure about yourself  0  Trouble concentrating 0  Moving slowly or fidgety/restless 0  Suicidal thoughts 0  PHQ-9 Score 4  Difficult doing work/chores Somewhat difficult     Patient Active Problem List   Diagnosis Date Noted  . Hyperglycemia 09/27/2016  . COPD with acute exacerbation ([place]) 04/29/2016  . Tobacco abuse 08/20/2015  . Allergic rhinitis, seasonal 05/12/2015  . COPD (chronic obstructive pulmonary disease) ([place]) 05/12/2015  . Degeneration of intervertebral disc of cervical region 05/12/2015  . Acid reflux 05/12/2015  . BP (high blood pressure) 05/12/2015  . Depression 05/12/2015    Past Medical History:  Diagnosis Date  . COPD (chronic obstructive pulmonary disease) ([place])   . Depression   . GERD (gastroesophageal reflux disease)   . Hyperlipidemia   . Hypertension     Past Surgical History:  Procedure  Laterality Date  . NECK SURGERY N/A 10/2003    Social History   Tobacco Use  . Smoking status: Current Every Day Smoker    Packs/day: 0.25    Years: 40.00    Pack years: 10.00    Types: Cigarettes  . Smokeless tobacco: Never Used  Substance Use Topics  . Alcohol use: No    Alcohol/week: 0.0 oz     Current Outpatient Medications:  .  albuterol (VENTOLIN HFA) 108 (90 Base) MCG/ACT inhaler, Inhale 2 puffs into the lungs every 6 (six) hours as needed for wheezing or shortness of breath., Disp: 18 Inhaler, Rfl: 2 .  budesonide-formoterol (SYMBICORT) 160-4.5 MCG/ACT inhaler, TAKE 2 PUFFS BY MOUTH TWICE A DAY, Disp: 10.2 Inhaler, Rfl: 0 .  citalopram (CELEXA) 20 MG tablet, Take 1 tablet (20 mg total) by mouth daily., Disp: 90 tablet, Rfl: 1 .  ipratropium-albuterol (DUONEB) 0.5-2.5 (3) MG/3ML SOLN, TAKE 3 MLS BY NEBULIZATION EVERY 6 (SIX) HOURS AS NEEDED., Disp: 360 mL, Rfl: 0 .  lisinopril-hydrochlorothiazide (PRINZIDE,ZESTORETIC) 20-25 MG tablet, Take 1 tablet by mouth daily., Disp: 30 tablet, Rfl: 0 .  metoprolol succinate (TOPROL-XL) 25 MG 24 hr tablet, TAKE 1 TABLET (25 MG TOTAL) BY MOUTH DAILY., Disp: 90 tablet, Rfl: 1 .  pantoprazole (PROTONIX) 40 MG tablet, Take 1 tablet (40 mg total) by mouth daily., Disp: 30 tablet, Rfl: 0  Allergies  Allergen Reactions  . Levofloxacin Shortness Of  Breath  . Penicillins Hives    itching    ROS   Constitutional: Negative for fever or Has hadweight gain.  Respiratory: Positive for chronic cough and exertional shortness of breath.   Cardiovascular: Negative for chest pain or palpitations.  Gastrointestinal: Negative for abdominal pain, no bowel changes.  Musculoskeletal: Negative for gait problem or joint swelling.  Skin: Negative for rash.  Neurological: Negative for dizziness or headache.  No other specific complaints in a complete review of systems (except as listed in HPI above).  Objective  Vitals:   11/22/17 0959  BP: 120/80   Pulse: 69  Resp: 16  Temp: 98.4 F (36.9 C)  TempSrc: Oral  SpO2: 94%  Weight: 121 lb 1.6 oz (54.9 kg)  Height: 5' (1.524 m)     Body mass index is 23.65 kg/m.  Nursing Note and Vital Signs reviewed.  Physical Exam  Constitutional: Patient appears well-developed and well-nourished.  No distress.  HEENT: head atraumatic, normocephalic, pupils equal and reactive to light, right TM's without erythema or bulging, Left TM impacted, pt doesn't want irrigated,  no maxillary or frontal sinus tenderness , neck stiff to left without lymphadenopathy, oropharynx pink and moist without exudate, clear,thin  nasal discharge Cardiovascular: Normal rate, regular rhythm, S1/S2 present.  Pulses intact  Pulmonary/Chest: Effort normal and breath sounds clear. No respiratory distress or retractions. MSK: neck stiff to left, non-tender but painful movement  Psychiatric: Patient has a normal mood and affect. behavior is normal. Judgment and thought content normal.  No results found for this or any previous visit (from the past 72 hour(s)).  Assessment & Plan 1. Chronic obstructive pulmonary disease, unspecified COPD type ([facility]) Stable cont meds - albuterol (VENTOLIN HFA) 108 (90 Base) MCG/ACT inhaler; Inhale 2 puffs into the lungs every 6 (six) hours as needed for wheezing or shortness of breath.  Dispense: 18 Inhaler; Refill: 2 - budesonide-formoterol (SYMBICORT) 160-4.5 MCG/ACT inhaler; TAKE 2 PUFFS BY MOUTH TWICE A DAY  Dispense: 10.2 Inhaler; Refill: 2 - CBC  2. Essential hypertension Stable cont meds - COMPLETE METABOLIC PANEL WITH GFR  3. Non-seasonal allergic rhinitis, unspecified trigger Half dose as needed  - levocetirizine (XYZAL) 2.5 MG/5ML solution; Take 5 mLs (2.5 mg total) by mouth every evening.  Dispense: 148 mL; Refill: 0  4. Degeneration of intervertebral disc of cervical region -worsening  - Ambulatory referral to Orthopedic Surgery - tiZANidine (ZANAFLEX) 2 MG tablet; Take 1  tablet (2 mg total) by mouth every evening.  Dispense: 30 tablet; Refill: 0  5. Gastroesophageal reflux disease, esophagitis presence not specified Stable cont meds, discussed avoiding triggers, and use of antacids prior to eating triggers - COMPLETE METABOLIC PANEL WITH GFR - CBC  6. Recurrent major depressive disorder, in full remission ([place]) Stable cont meds - COMPLETE METABOLIC PANEL WITH GFR  7. Tobacco abuse - discussed smoking cessation for 5 minutes; plan to cut down amount each week. Follow up in one month to discuss improvements.  - CBC  8. Screening for lipid disorders  - Lipid Profile  9. Screening for HIV (human immunodeficiency virus)  - HIV antibody (with reflex)  10. Need for hepatitis C screening test  - Hepatitis C Antibody  11. Screening for colon cancer - Cologuard  12. Encounter for screening for malignant neoplasm of respiratory organs - CT CHEST LUNG CA SCREEN LOW DOSE W/O CM; Future  13. Smokes less than 1 pack a day with greater than 30 pack year history - CT CHEST LUNG CA  SCREEN LOW DOSE W/O CM; Future    -Follow up and care instructions discussed and provided in AVS. -Reviewed Health Maintenance: see above.   -------------------------------- I have reviewed this encounter including the documentation in this note and/or discussed this patient with the provider, Suezanne Cheshire DNP AGNP-C. I am certifying that I agree with the content of this note as supervising physician. Enid Derry, [facility] 11/22/2017, 12:05 PM

## 2017-11-22 NOTE — Patient Instructions (Addendum)
- You can take tums 1 hour before eating your oranges or tomatoes to help prevent burning sensation - Work on cutting down smoking by 2 cigarettes each week - Take muscle relaxer when having severe neck pain- can take half pill and the other half an hour later if not helping. Sending referral to orthopedic to discuss other options.    Coping with Quitting Smoking Quitting smoking is a physical and mental challenge. You will face cravings, withdrawal symptoms, and temptation. Before quitting, work with your health care provider to make a plan that can help you cope. Preparation can help you quit and keep you from giving in. How can I cope with cravings? Cravings usually last for 5-10 minutes. If you get through it, the craving will pass. Consider taking the following actions to help you cope with cravings:  Keep your mouth busy: ? Chew sugar-free gum. ? Suck on hard candies or a straw. ? Brush your teeth.  Keep your hands and body busy: ? Immediately change to a different activity when you feel a craving. ? Squeeze or play with a ball. ? Do an activity or a hobby, like making bead jewelry, practicing needlepoint, or working with wood. ? Mix up your normal routine. ? Take a short exercise break. Go for a quick walk or run up and down stairs. ? Spend time in public places where smoking is not allowed.  Focus on doing something kind or helpful for someone else.  Call a friend or family member to talk during a craving.  Join a support group.  Call a quit line, such as 1-800-QUIT-NOW.  Talk with your health care provider about medicines that might help you cope with cravings and make quitting easier for you.  How can I deal with withdrawal symptoms? Your body may experience negative effects as it tries to get used to not having nicotine in the system. These effects are called withdrawal symptoms. They may include:  Feeling hungrier than normal.  Trouble  concentrating.  Irritability.  Trouble sleeping.  Feeling depressed.  Restlessness and agitation.  Craving a cigarette.  To manage withdrawal symptoms:  Avoid places, people, and activities that trigger your cravings.  Remember why you want to quit.  Get plenty of sleep.  Avoid coffee and other caffeinated drinks. These may worsen some of your symptoms.  How can I handle social situations? Social situations can be difficult when you are quitting smoking, especially in the first few weeks. To manage this, you can:  Avoid parties, bars, and other social situations where people might be smoking.  Avoid alcohol.  Leave right away if you have the urge to smoke.  Explain to your family and friends that you are quitting smoking. Ask for understanding and support.  Plan activities with friends or family where smoking is not an option.  What are some ways I can cope with stress? Wanting to smoke may cause stress, and stress can make you want to smoke. Find ways to manage your stress. Relaxation techniques can help. For example:  Breathe slowly and deeply, in through your nose and out through your mouth.  Listen to soothing, relaxing music.  Talk with a family member or friend about your stress.  Light a candle.  Soak in a bath or take a shower.  Think about a peaceful place.  What are some ways I can prevent weight gain? Be aware that many people gain weight after they quit smoking. However, not everyone does. To keep from  gaining weight, have a plan in place before you quit and stick to the plan after you quit. Your plan should include:  Having healthy snacks. When you have a craving, it may help to: ? Eat plain popcorn, crunchy carrots, celery, or other cut vegetables. ? Chew sugar-free gum.  Changing how you eat: ? Eat small portion sizes at meals. ? Eat 4-6 small meals throughout the day instead of 1-2 large meals a day. ? Be mindful when you eat. Do not watch  television or do other things that might distract you as you eat.  Exercising regularly: ? Make time to exercise each day. If you do not have time for a long workout, do short bouts of exercise for 5-10 minutes several times a day. ? Do some form of strengthening exercise, like weight lifting, and some form of aerobic exercise, like running or swimming.  Drinking plenty of water or other low-calorie or no-calorie drinks. Drink 6-8 glasses of water daily, or as much as instructed by your health care provider.  Summary  Quitting smoking is a physical and mental challenge. You will face cravings, withdrawal symptoms, and temptation to smoke again. Preparation can help you as you go through these challenges.  You can cope with cravings by keeping your mouth busy (such as by chewing gum), keeping your body and hands busy, and making calls to family, friends, or a helpline for people who want to quit smoking.  You can cope with withdrawal symptoms by avoiding places where people smoke, avoiding drinks with caffeine, and getting plenty of rest.  Ask your health care provider about the different ways to prevent weight gain, avoid stress, and handle social situations. This information is not intended to replace advice given to you by your health care provider. Make sure you discuss any questions you have with your health care provider. Document Released: 05/20/2016 Document Revised: 05/20/2016 Document Reviewed: 05/20/2016 Elsevier Interactive Patient Education  [employer].

## 2017-11-23 ENCOUNTER — Telehealth: Payer: Self-pay | Admitting: Nurse Practitioner

## 2017-11-23 ENCOUNTER — Telehealth: Payer: Self-pay | Admitting: *Deleted

## 2017-11-23 DIAGNOSIS — Z122 Encounter for screening for malignant neoplasm of respiratory organs: Secondary | ICD-10-CM

## 2017-11-23 DIAGNOSIS — Z87891 Personal history of nicotine dependence: Secondary | ICD-10-CM

## 2017-11-23 LAB — LIPID PANEL
CHOL/HDL RATIO: 2 (calc) (ref <5.0)
CHOLESTEROL: 186 mg/dL (ref <200)
HDL: 92 mg/dL (ref >50)
LDL CHOLESTEROL (CALC): 80 mg/dL
Non-HDL Cholesterol (Calc): 94 mg/dL (calc) (ref <130)
TRIGLYCERIDES: 49 mg/dL (ref <150)

## 2017-11-23 LAB — COMPLETE METABOLIC PANEL WITH GFR
AG RATIO: 1.5 (calc) (ref 1.0–2.5)
ALT: 17 U/L (ref 6–29)
AST: 20 U/L (ref 10–35)
Albumin: 4.1 g/dL (ref 3.6–5.1)
Alkaline phosphatase (APISO): 64 U/L (ref 33–130)
BUN: 15 mg/dL (ref 7–25)
CALCIUM: 9 mg/dL (ref 8.6–10.4)
CO2: 26 mmol/L (ref 20–32)
Chloride: 97 mmol/L — ABNORMAL LOW (ref 98–110)
Creat: 0.69 mg/dL (ref 0.50–0.99)
GFR, EST AFRICAN AMERICAN: 106 mL/min/{1.73_m2} (ref >=60)
GFR, EST NON AFRICAN AMERICAN: 91 mL/min/{1.73_m2} (ref >=60)
Globulin: 2.7 g/dL (calc) (ref 1.9–3.7)
Glucose, Bld: 93 mg/dL (ref 65–139)
POTASSIUM: 4.2 mmol/L (ref 3.5–5.3)
Sodium: 129 mmol/L — ABNORMAL LOW (ref 135–146)
TOTAL PROTEIN: 6.8 g/dL (ref 6.1–8.1)
Total Bilirubin: 0.3 mg/dL (ref 0.2–1.2)

## 2017-11-23 LAB — CBC
HCT: 36 % (ref 35.0–45.0)
HEMOGLOBIN: 11.9 g/dL (ref 11.7–15.5)
MCH: 25.4 pg — AB (ref 27.0–33.0)
MCHC: 33.1 g/dL (ref 32.0–36.0)
MCV: 76.8 fL — AB (ref 80.0–100.0)
MPV: 10 fL (ref 7.5–12.5)
PLATELETS: 343 10*3/uL (ref 140–400)
RBC: 4.69 10*6/uL (ref 3.80–5.10)
RDW: 17 % — ABNORMAL HIGH (ref 11.0–15.0)
WBC: 7.4 10*3/uL (ref 3.8–10.8)

## 2017-11-23 LAB — HIV ANTIBODY (ROUTINE TESTING W REFLEX): HIV 1&2 Ab, 4th Generation: NONREACTIVE

## 2017-11-23 LAB — HEPATITIS C ANTIBODY
Hepatitis C Ab: NONREACTIVE
SIGNAL TO CUT-OFF: 0.03 (ref <1.00)

## 2017-11-23 NOTE — Telephone Encounter (Signed)
Patient notified of results and will discuss appointment when CT call

## 2017-11-23 NOTE — Telephone Encounter (Signed)
Spoke to Dr. Ancil Boozer about this patient- please ask how much water she is drinking and request her to increase her salt intake. We also recommend she schedule her chest CT as soon as possible, if it is not able to be scheduled within the next week we will start with a chest xray. Can we also schedule her for an appointment sometime next week to discuss electrolytes, blood cell count and smoking.   Please discuss ER precautions: Nausea, vomiting, severe headache, lethargy, confusion, chest pain, shortness of breath.

## 2017-11-23 NOTE — Telephone Encounter (Signed)
Received referral for initial lung cancer screening scan. Contacted patient and obtained smoking history,(current, 45 pack y ear) as well as answering questions related to screening process. Patient denies signs of lung cancer such as weight loss or hemoptysis. Patient denies comorbidity that would prevent curative treatment if lung cancer were found. Patient is scheduled for shared decision making visit and CT scan on 12/07/17.

## 2017-11-28 NOTE — Progress Notes (Signed)
Please call to send in cologuard

## 2017-12-06 ENCOUNTER — Encounter: Payer: Self-pay | Admitting: Nurse Practitioner

## 2017-12-08 ENCOUNTER — Ambulatory Visit: Admission: RE | Admit: 2017-12-08 | Payer: Medicare Other | Source: Ambulatory Visit

## 2017-12-08 ENCOUNTER — Inpatient Hospital Stay: Payer: Medicare Other | Admitting: Nurse Practitioner

## 2017-12-12 ENCOUNTER — Inpatient Hospital Stay: Payer: Medicare Other | Attending: Nurse Practitioner | Admitting: Nurse Practitioner

## 2017-12-12 ENCOUNTER — Ambulatory Visit: Admission: RE | Admit: 2017-12-12 | Payer: Medicare Other | Source: Ambulatory Visit

## 2017-12-14 ENCOUNTER — Telehealth: Payer: Self-pay | Admitting: *Deleted

## 2017-12-14 NOTE — Telephone Encounter (Signed)
Received referral for low dose lung cancer screening CT scan. Message left at phone number listed in EMR for patient to call me back to facilitate scheduling scan. Note, patient was no show for prior screening appt.  

## 2017-12-19 ENCOUNTER — Other Ambulatory Visit: Payer: Self-pay | Admitting: Nurse Practitioner

## 2017-12-19 DIAGNOSIS — M5030 Other cervical disc degeneration, unspecified cervical region: Secondary | ICD-10-CM

## 2017-12-25 DIAGNOSIS — M5412 Radiculopathy, cervical region: Secondary | ICD-10-CM | POA: Diagnosis not present

## 2017-12-25 DIAGNOSIS — M542 Cervicalgia: Secondary | ICD-10-CM | POA: Diagnosis not present

## 2018-01-01 ENCOUNTER — Encounter: Payer: Self-pay | Admitting: Family Medicine

## 2018-01-01 ENCOUNTER — Ambulatory Visit (INDEPENDENT_AMBULATORY_CARE_PROVIDER_SITE_OTHER): Payer: Medicare Other | Admitting: Family Medicine

## 2018-01-01 VITALS — BP 116/90 | HR 71 | Temp 98.3°F | Resp 16 | Ht 60.0 in | Wt 122.5 lb

## 2018-01-01 DIAGNOSIS — D692 Other nonthrombocytopenic purpura: Secondary | ICD-10-CM | POA: Diagnosis not present

## 2018-01-01 DIAGNOSIS — G8929 Other chronic pain: Secondary | ICD-10-CM

## 2018-01-01 DIAGNOSIS — B977 Papillomavirus as the cause of diseases classified elsewhere: Secondary | ICD-10-CM | POA: Insufficient documentation

## 2018-01-01 DIAGNOSIS — F3342 Major depressive disorder, recurrent, in full remission: Secondary | ICD-10-CM | POA: Insufficient documentation

## 2018-01-01 DIAGNOSIS — K219 Gastro-esophageal reflux disease without esophagitis: Secondary | ICD-10-CM | POA: Diagnosis not present

## 2018-01-01 DIAGNOSIS — J449 Chronic obstructive pulmonary disease, unspecified: Secondary | ICD-10-CM

## 2018-01-01 DIAGNOSIS — M542 Cervicalgia: Secondary | ICD-10-CM

## 2018-01-01 DIAGNOSIS — I10 Essential (primary) hypertension: Secondary | ICD-10-CM | POA: Diagnosis not present

## 2018-01-01 DIAGNOSIS — M5412 Radiculopathy, cervical region: Secondary | ICD-10-CM | POA: Diagnosis not present

## 2018-01-01 DIAGNOSIS — J3089 Other allergic rhinitis: Secondary | ICD-10-CM

## 2018-01-01 MED ORDER — LOSARTAN POTASSIUM-HCTZ 100-12.5 MG PO TABS: 1.0000 | ORAL_TABLET | Freq: Every day | ORAL | 1 refills | Status: DC

## 2018-01-01 MED ORDER — FLUTICASONE PROPIONATE 50 MCG/ACT NA SUSP: 2.0000 | Freq: Every day | NASAL | 2 refills | Status: DC

## 2018-01-01 MED ORDER — PANTOPRAZOLE SODIUM 40 MG PO TBEC: 40.0000 mg | DELAYED_RELEASE_TABLET | Freq: Every day | ORAL | 1 refills | Status: DC

## 2018-01-01 MED ORDER — CITALOPRAM HYDROBROMIDE 20 MG PO TABS: 20.0000 mg | ORAL_TABLET | Freq: Every day | ORAL | 1 refills | Status: DC

## 2018-01-01 MED ORDER — LORATADINE 10 MG PO TABS: 10.0000 mg | ORAL_TABLET | Freq: Every day | ORAL | 2 refills | Status: DC

## 2018-01-01 MED ORDER — FLUTICASONE-UMECLIDIN-VILANT 100-62.5-25 MCG/INH IN AEPB: 1.0000 | INHALATION_SPRAY | Freq: Every day | RESPIRATORY_TRACT | 2 refills | Status: DC

## 2018-01-01 MED ORDER — METOPROLOL SUCCINATE ER 25 MG PO TB24: ORAL_TABLET | ORAL | 1 refills | Status: DC

## 2018-01-01 NOTE — Progress Notes (Signed)
Name: Terri Burns   MRN: [id number]    DOB: 1950-07-06   Date:01/01/2018       Progress Note  Subjective  Chief Complaint  Chief Complaint  Patient presents with  . Follow-up    1 month    HPI   AR: she states long history of allergies, but getting worse, watery eyes, nasal congestion , post-nasal drainage, itchy eyes. She has tried otc loratadine, but not using nasal spray. No fever or chills  Chronic neck pain: going to [facility] now, had x-ray and will have MRI today. She states pain has been worse since 06/2017, causing so much discomfort that she has not been doing much around her house, staying still and has gained weight. Pain is constant but worse with activity. Tried meloxicam but made her dizzy, currently taking ibuprofen prn, she also takes tizanidine prn.   HPV positive: explained that she needs to return for pap smear.   HTN: she has a history of hyonatremia secondary to diuretics, we will change from lisinopril hctz to losartan hctz with only 12.5 mg, no chest pain or palpitation.   COPd: she has a daily cough and sob with activity, on symbicort for many years, discussed options and we will try trelegy. She states humidity and heat is worse on her. She is due for pneumonia but she wants to hold off until next visit.   Major depression: she states under control with medication.   Patient Active Problem List   Diagnosis Date Noted  . 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 Chronic pain 01/01/2018  . Benign essential HTN 01/01/2018  . Hyperglycemia 09/27/2016  . Tobacco abuse 08/20/2015  . Allergic rhinitis, seasonal 05/12/2015  . COPD (chronic obstructive pulmonary disease) ([place]) 05/12/2015  . Degeneration of intervertebral disc of cervical region 05/12/2015  . GERD (gastroesophageal reflux disease) 05/12/2015  . Major depression in remission ([place]) 05/12/2015    Past Surgical History:  Procedure Laterality Date  . NECK SURGERY N/A 10/2003     Family History  Problem Relation Age of Onset  . Hypertension Mother     Social History   Socioeconomic History  . Marital status: Widowed    Spouse name: Not on file  . Number of children: 3  . Years of education: Not on file  . Highest education level: Some college, no degree  Occupational History  . Not on file  Social Needs  . Financial resource strain: Hard  . Food insecurity:    Worry: Sometimes true    Inability: Sometimes true  . Transportation needs:    Medical: No    Non-medical: No  Tobacco Use  . Smoking status: Current Every Day Smoker    Packs/day: 1.00    Years: 45.00    Pack years: 45.00    Types: Cigarettes  . Smokeless tobacco: Never Used  . Tobacco comment: she is cutting back , half pack now  Substance and Sexual Activity  . Alcohol use: No    Alcohol/week: 0.0 oz  . Drug use: No  . Sexual activity: Never  Lifestyle  . Physical activity:    Days per week: 3 days    Minutes per session: Not on file  . Stress: Not at all  Relationships  . Social connections:    Talks on phone: Not on file    Gets together: Not on file    Attends religious service: Not on file    Active member of club or organization: Not  on file    Attends meetings of clubs or organizations: Not on file    Relationship status: Not on file  . Intimate partner violence:    Fear of current or ex partner: Not on file    Emotionally abused: Not on file    Physically abused: Not on file    Forced sexual activity: Not on file  Other Topics Concern  . Not on file  Social History Narrative  . Not on file     Current Outpatient Medications:  .  albuterol (VENTOLIN HFA) 108 (90 Base) MCG/ACT inhaler, Inhale 2 puffs into the lungs every 6 (six) hours as needed for wheezing or shortness of breath., Disp: 18 Inhaler, Rfl: 2 .  citalopram (CELEXA) 20 MG tablet, Take 1 tablet (20 mg total) by mouth daily., Disp: 90 tablet, Rfl: 1 .  ipratropium-albuterol (DUONEB) 0.5-2.5 (3)  MG/3ML SOLN, TAKE 3 MLS BY NEBULIZATION EVERY 6 (SIX) HOURS AS NEEDED., Disp: 360 mL, Rfl: 0 .  metoprolol succinate (TOPROL-XL) 25 MG 24 hr tablet, TAKE 1 TABLET (25 MG TOTAL) BY MOUTH DAILY., Disp: 90 tablet, Rfl: 1 .  pantoprazole (PROTONIX) 40 MG tablet, Take 1 tablet (40 mg total) by mouth daily., Disp: 30 tablet, Rfl: 0 .  tiZANidine (ZANAFLEX) 2 MG tablet, Take 1 tablet (2 mg total) by mouth every evening., Disp: 30 tablet, Rfl: 0 .  meloxicam (MOBIC) 7.5 MG tablet, Take 7.5 mg by mouth daily., Disp: , Rfl: 0  Allergies  Allergen Reactions  . Levofloxacin Shortness Of Breath  . Penicillins Hives    itching     ROS  Constitutional: Negative for fever, positive for weight change - not as active because of neck pain .  Respiratory: positive for cough and intermittent  shortness of breath.   Cardiovascular: Negative for chest pain or palpitations.  Gastrointestinal: Negative for abdominal pain, no bowel changes.  Musculoskeletal: Negative for gait problem or joint swelling.  Skin: Negative for rash.  Neurological: Negative for dizziness or headache.  No other specific complaints in a complete review of systems (except as listed in HPI above).  Objective  Vitals:   01/01/18 1155  BP: 116/90  Pulse: 71  Resp: 16  Temp: 98.3 F (36.8 C)  TempSrc: Oral  SpO2: 99%  Weight: 122 lb 8 oz (55.6 kg)  Height: 5' (1.524 m)    Body mass index is 23.92 kg/m.  Physical Exam  Constitutional: Patient appears well-developed and well-nourished.  No distress.  HEENT: head atraumatic, normocephalic, pupils equal and reactive to light, neck is slightly bent to the left because of pain,  throat within normal limits, watery eyes  Cardiovascular: Normal rate, regular rhythm and normal heart sounds.  No murmur heard. No BLE edema. Pulmonary/Chest: Effort normal and breath sounds normal. No respiratory distress. Abdominal: Soft.  There is no tenderness. Skin: thin with senile purpura   Psychiatric: Patient has a normal mood and affect. behavior is normal. Judgment and thought content normal.  Recent Results (from the past 2160 hour(s))  COMPLETE METABOLIC PANEL WITH GFR     Status: Abnormal   Collection Time: 11/22/17 10:53 AM  Result Value Ref Range   Glucose, Bld 93 65 - 139 mg/dL    Comment: .        Non-fasting reference interval .    BUN 15 7 - 25 mg/dL   Creat 0.69 0.50 - 0.99 mg/dL    Comment: For patients >29 years of age, the reference limit for Creatinine  is approximately 13% higher for people identified as African-American. .    GFR, Est Non African American 91 > OR = 60 mL/min/1.61m2   GFR, Est African American 106 > OR = 60 mL/min/1.63m2   BUN/Creatinine Ratio NOT APPLICABLE 6 - 22 (calc)   Sodium 129 (L) 135 - 146 mmol/L   Potassium 4.2 3.5 - 5.3 mmol/L   Chloride 97 (L) 98 - 110 mmol/L   CO2 26 20 - 32 mmol/L   Calcium 9.0 8.6 - 10.4 mg/dL   Total Protein 6.8 6.1 - 8.1 g/dL   Albumin 4.1 3.6 - 5.1 g/dL   Globulin 2.7 1.9 - 3.7 g/dL (calc)   AG Ratio 1.5 1.0 - 2.5 (calc)   Total Bilirubin 0.3 0.2 - 1.2 mg/dL   Alkaline phosphatase (APISO) 64 33 - 130 U/L   AST 20 10 - 35 U/L   ALT 17 6 - 29 U/L  CBC     Status: Abnormal   Collection Time: 11/22/17 10:53 AM  Result Value Ref Range   WBC 7.4 3.8 - 10.8 Thousand/uL   RBC 4.69 3.80 - 5.10 Million/uL   Hemoglobin 11.9 11.7 - 15.5 g/dL   HCT 36.0 35.0 - 45.0 %   MCV 76.8 (L) 80.0 - 100.0 fL   MCH 25.4 (L) 27.0 - 33.0 pg   MCHC 33.1 32.0 - 36.0 g/dL   RDW 17.0 (H) 11.0 - 15.0 %   Platelets 343 140 - 400 Thousand/uL   MPV 10.0 7.5 - 12.5 fL  Lipid Profile     Status: None   Collection Time: 11/22/17 10:53 AM  Result Value Ref Range   Cholesterol 186 <200 mg/dL   HDL 92 >50 mg/dL   Triglycerides 49 <150 mg/dL   LDL Cholesterol (Calc) 80 mg/dL (calc)    Comment: Reference range: <100 . Desirable range <100 mg/dL for primary prevention;   <70 mg/dL for patients with CHD or diabetic  patients  with > or = 2 CHD risk factors. Marland Kitchen LDL-C is now calculated using the Martin-Hopkins  calculation, which is a validated novel method providing  better accuracy than the Friedewald equation in the  estimation of LDL-C.  Cresenciano Genre et al. Annamaria Helling. 4944;967(59): 2061-2068  (http://education.[facility].com/faq/FAQ164)    Total CHOL/HDL Ratio 2.0 <5.0 (calc)   Non-HDL Cholesterol (Calc) 94 <130 mg/dL (calc)    Comment: For patients with diabetes plus 1 major ASCVD risk  factor, treating to a non-HDL-C goal of <100 mg/dL  (LDL-C of <70 mg/dL) is considered a therapeutic  option.   HIV antibody (with reflex)     Status: None   Collection Time: 11/22/17 10:53 AM  Result Value Ref Range   HIV 1&2 Ab, 4th Generation NON-REACTIVE NON-REACTI    Comment: HIV-1 antigen and HIV-1/HIV-2 antibodies were not detected. There is no laboratory evidence of HIV infection. Marland Kitchen PLEASE NOTE: This information has been disclosed to you from records whose confidentiality may be protected by state law.  If your state requires such protection, then the state law prohibits you from making any further disclosure of the information without the specific written consent of the person to whom it pertains, or as otherwise permitted by law. A general authorization for the release of medical or other information is NOT sufficient for this purpose. . For additional information please refer to http://education.[facility].com/faq/FAQ106 (This link is being provided for informational/ educational purposes only.) . Marland Kitchen The performance of this assay has not been clinically validated in patients less than 2  years old. .   Hepatitis C Antibody     Status: None   Collection Time: 11/22/17 10:53 AM  Result Value Ref Range   Hepatitis C Ab NON-REACTIVE NON-REACTI   SIGNAL TO CUT-OFF 0.03 <1.00    Comment: . HCV antibody was non-reactive. There is no laboratory  evidence of HCV infection. . In most  cases, no further action is required. However, if recent HCV exposure is suspected, a test for HCV RNA (test code [phone]) is suggested. . For additional information please refer to http://education.[facility].com/faq/FAQ22v1 (This link is being provided for informational/ educational purposes only.) .      PHQ2/9: Depression screen [facility] 2/9 01/01/2018 11/22/2017 06/29/2017 03/29/2017 09/27/2016  Decreased Interest 0 1 0 0 0  Down, Depressed, Hopeless 0 0 0 0 0  PHQ - 2 Score 0 1 0 0 0  Altered sleeping 0 0 - - -  Tired, decreased energy 1 2 - - -  Change in appetite 0 1 - - -  Feeling bad or failure about yourself  0 0 - - -  Trouble concentrating 0 0 - - -  Moving slowly or fidgety/restless 0 0 - - -  Suicidal thoughts 0 0 - - -  PHQ-9 Score 1 4 - - -  Difficult doing work/chores Not difficult at all Somewhat difficult - - -     Fall Risk: Fall Risk  01/01/2018 06/29/2017 03/29/2017 09/27/2016 09/20/2016  Falls in the past year? No No No No No  Risk for fall due to : - - Impaired balance/gait - -  Risk for fall due to: Comment - - staggered gait - -    Functional Status Survey: Is the patient deaf or have difficulty hearing?: No Does the patient have difficulty seeing, even when wearing glasses/contacts?: No Does the patient have difficulty concentrating, remembering, or making decisions?: No Does the patient have difficulty walking or climbing stairs?: No Does the patient have difficulty dressing or bathing?: No Does the patient have difficulty doing errands alone such as visiting a doctor's office or shopping?: No    Assessment & Plan  1. Chronic obstructive pulmonary disease, unspecified COPD type ([place])  - Fluticasone-Umeclidin-Vilant (TRELEGY ELLIPTA) 100-62.5-25 MCG/INH AEPB; Inhale 1 puff into the lungs daily. In place of symbicort  Dispense: 60 each; Refill: 2  2. Perennial allergic rhinitis  - fluticasone (FLONASE) 50 MCG/ACT nasal spray; Place 2 sprays  into both nostrils daily.  Dispense: 16 g; Refill: 2 - loratadine (CLARITIN) 10 MG tablet; Take 1 tablet (10 mg total) by mouth daily.  Dispense: 60 tablet; Refill: 2  3. Recurrent major depressive disorder, in full remission ([place])  - citalopram (CELEXA) 20 MG tablet; Take 1 tablet (20 mg total) by mouth daily.  Dispense: 90 tablet; Refill: 1  4. Essential hypertension  - metoprolol succinate (TOPROL-XL) 25 MG 24 hr tablet; TAKE 1 TABLET (25 MG TOTAL) BY MOUTH DAILY.  Dispense: 90 tablet; Refill: 1 - losartan-hydrochlorothiazide (HYZAAR) 100-12.5 MG tablet; Take 1 tablet by mouth daily. In place of lisinopril hctz  Dispense: 90 tablet; Refill: 1  5. Gastroesophageal reflux disease, esophagitis presence not specified  - pantoprazole (PROTONIX) 40 MG tablet; Take 1 tablet (40 mg total) by mouth daily.  Dispense: 90 tablet; Refill: 1  6. Chronic neck pain  Keep follow up with [facility]    7. Senile purpura ([place])  stable

## 2018-01-04 DIAGNOSIS — M6281 Muscle weakness (generalized): Secondary | ICD-10-CM | POA: Diagnosis not present

## 2018-01-04 DIAGNOSIS — M542 Cervicalgia: Secondary | ICD-10-CM | POA: Diagnosis not present

## 2018-01-10 ENCOUNTER — Encounter: Payer: Self-pay | Admitting: Family Medicine

## 2018-01-16 DIAGNOSIS — M542 Cervicalgia: Secondary | ICD-10-CM | POA: Diagnosis not present

## 2018-01-16 DIAGNOSIS — M6281 Muscle weakness (generalized): Secondary | ICD-10-CM | POA: Diagnosis not present

## 2018-01-18 DIAGNOSIS — M6281 Muscle weakness (generalized): Secondary | ICD-10-CM | POA: Diagnosis not present

## 2018-01-18 DIAGNOSIS — M542 Cervicalgia: Secondary | ICD-10-CM | POA: Diagnosis not present

## 2018-01-24 DIAGNOSIS — M5412 Radiculopathy, cervical region: Secondary | ICD-10-CM | POA: Diagnosis not present

## 2018-02-07 ENCOUNTER — Ambulatory Visit: Payer: Medicare Other | Admitting: Family Medicine

## 2018-02-08 ENCOUNTER — Telehealth: Payer: Self-pay | Admitting: *Deleted

## 2018-02-08 ENCOUNTER — Encounter: Payer: Self-pay | Admitting: *Deleted

## 2018-02-08 DIAGNOSIS — Z122 Encounter for screening for malignant neoplasm of respiratory organs: Secondary | ICD-10-CM

## 2018-02-08 NOTE — Telephone Encounter (Signed)
Received a referral for initial lung cancer screening scan.  Contacted the patient and obtained their smoking history, currently smoking 0.5-1 ppd with a smoking history of  53 pkyr  as well as answering questions related to screening process.  Patient denies signs of lung cancer such as weight loss or hemoptysis at this time.  Patient denies comorbidity that would prevent curative treatment if lung cancer were found.  Patient is scheduled for the Shared Decision Making Visit and CT scan on 03-01-18 @1330 .

## 2018-03-01 ENCOUNTER — Ambulatory Visit
Admission: RE | Admit: 2018-03-01 | Discharge: 2018-03-01 | Disposition: A | Discharge status: Home / Self Care | Payer: Medicare Other | Source: Ambulatory Visit | Attending: Nurse Practitioner | Admitting: Nurse Practitioner

## 2018-03-01 ENCOUNTER — Inpatient Hospital Stay: Payer: Medicare Other | Attending: Nurse Practitioner | Admitting: Nurse Practitioner

## 2018-03-01 DIAGNOSIS — I2510 Atherosclerotic heart disease of native coronary artery without angina pectoris: Secondary | ICD-10-CM | POA: Diagnosis not present

## 2018-03-01 DIAGNOSIS — Z122 Encounter for screening for malignant neoplasm of respiratory organs: Secondary | ICD-10-CM

## 2018-03-01 DIAGNOSIS — I700 Atherosclerosis of aorta: Secondary | ICD-10-CM | POA: Insufficient documentation

## 2018-03-01 DIAGNOSIS — J432 Centrilobular emphysema: Secondary | ICD-10-CM | POA: Insufficient documentation

## 2018-03-01 DIAGNOSIS — Z87891 Personal history of nicotine dependence: Secondary | ICD-10-CM | POA: Insufficient documentation

## 2018-03-01 DIAGNOSIS — F17210 Nicotine dependence, cigarettes, uncomplicated: Secondary | ICD-10-CM | POA: Diagnosis not present

## 2018-03-01 NOTE — Progress Notes (Signed)
In accordance with CMS guidelines, patient has met eligibility criteria including age, absence of signs or symptoms of lung cancer.  Social History   Tobacco Use  . Smoking status: Current Every Day Smoker    Packs/day: 1.18    Years: 45.00    Pack years: 53.10    Types: Cigarettes  . Smokeless tobacco: Never Used  . Tobacco comment: she is cutting back , half pack now  Substance Use Topics  . Alcohol use: No    Alcohol/week: 0.0 standard drinks  . Drug use: No      A shared decision-making session was conducted prior to the performance of CT scan. This includes one or more decision aids, includes benefits and harms of screening, follow-up diagnostic testing, over-diagnosis, false positive rate, and total radiation exposure.   Counseling on the importance of adherence to annual lung cancer LDCT screening, impact of co-morbidities, and ability or willingness to undergo diagnosis and treatment is imperative for compliance of the program.   Counseling on the importance of continued smoking cessation for former smokers; the importance of smoking cessation for current smokers, and information about tobacco cessation interventions have been given to patient including [place] and 1800 quit [place] programs.   Written order for lung cancer screening with LDCT has been given to the patient and any and all questions have been answered to the best of my abilities.    Yearly follow up will be coordinated by Burgess Estelle, Thoracic Navigator.  Beckey Rutter, DNP, AGNP-C [place] at [facility] [phone] (work cell) [phone] (office)

## 2018-03-02 ENCOUNTER — Telehealth: Payer: Self-pay | Admitting: *Deleted

## 2018-03-02 NOTE — Telephone Encounter (Signed)
Notified patient of LDCT lung cancer screening program results with recommendation for 6 month follow up imaging. Also notified of incidental findings noted below and is encouraged to discuss further with PCP who will receive a copy of this note and/or the CT report. Patient verbalizes understanding.    : 1. Lung-RADS 3S, probably benign findings. Short-term follow-up in 6 months is recommended with repeat low-dose chest CT without contrast (please use the following order, "CT CHEST LCS NODULE FOLLOW-UP W/O CM"). 2. The "S" modifier above refers to potentially clinically significant non lung cancer related findings. Specifically, there is aortic atherosclerosis, in addition to left main and 2 vessel coronary artery disease. Please note that although the presence of coronary artery calcium documents the presence of coronary artery disease, the severity of this disease and any potential stenosis cannot be assessed on this non-gated CT examination. Assessment for potential risk factor modification, dietary therapy or pharmacologic therapy may be warranted, if clinically indicated. 3. Mild diffuse bronchial thickening with mild centrilobular and paraseptal emphysema; imaging findings suggestive of underlying COPD.  Aortic Atherosclerosis (ICD10-I70.0) and Emphysema (ICD10-J43.9).   Electronically Signed   By: Mauri Brooklyn.D.

## 2018-03-04 ENCOUNTER — Encounter: Payer: Self-pay | Admitting: Family Medicine

## 2018-03-04 DIAGNOSIS — I2584 Coronary atherosclerosis due to calcified coronary lesion: Secondary | ICD-10-CM

## 2018-03-04 DIAGNOSIS — I2510 Atherosclerotic heart disease of native coronary artery without angina pectoris: Secondary | ICD-10-CM | POA: Insufficient documentation

## 2018-03-04 DIAGNOSIS — I700 Atherosclerosis of aorta: Secondary | ICD-10-CM | POA: Insufficient documentation

## 2018-03-04 NOTE — Telephone Encounter (Addendum)
Please schedule appointment for patient in the next 2 months to discuss CT report and also regular follow up, she can come in sooner if she would like

## 2018-03-05 NOTE — Telephone Encounter (Signed)
Spoke with pt and appt made for 05/01/18

## 2018-03-07 DIAGNOSIS — M542 Cervicalgia: Secondary | ICD-10-CM | POA: Diagnosis not present

## 2018-03-30 ENCOUNTER — Ambulatory Visit: Payer: Medicare Other

## 2018-03-31 ENCOUNTER — Other Ambulatory Visit: Payer: Self-pay | Admitting: Family Medicine

## 2018-03-31 DIAGNOSIS — J3089 Other allergic rhinitis: Secondary | ICD-10-CM

## 2018-04-01 ENCOUNTER — Other Ambulatory Visit: Payer: Self-pay | Admitting: Family Medicine

## 2018-04-01 DIAGNOSIS — J449 Chronic obstructive pulmonary disease, unspecified: Secondary | ICD-10-CM

## 2018-05-01 ENCOUNTER — Ambulatory Visit: Payer: Self-pay

## 2018-05-01 ENCOUNTER — Ambulatory Visit (INDEPENDENT_AMBULATORY_CARE_PROVIDER_SITE_OTHER): Payer: Medicare Other | Admitting: Family Medicine

## 2018-05-01 ENCOUNTER — Encounter: Payer: Self-pay | Admitting: Family Medicine

## 2018-05-01 VITALS — BP 110/68 | HR 72 | Temp 98.2°F | Resp 16 | Ht 60.0 in | Wt 131.6 lb

## 2018-05-01 DIAGNOSIS — J432 Centrilobular emphysema: Secondary | ICD-10-CM

## 2018-05-01 DIAGNOSIS — I700 Atherosclerosis of aorta: Secondary | ICD-10-CM | POA: Diagnosis not present

## 2018-05-01 DIAGNOSIS — J3089 Other allergic rhinitis: Secondary | ICD-10-CM

## 2018-05-01 DIAGNOSIS — I2510 Atherosclerotic heart disease of native coronary artery without angina pectoris: Secondary | ICD-10-CM | POA: Diagnosis not present

## 2018-05-01 DIAGNOSIS — J441 Chronic obstructive pulmonary disease with (acute) exacerbation: Secondary | ICD-10-CM | POA: Diagnosis not present

## 2018-05-01 DIAGNOSIS — F3342 Major depressive disorder, recurrent, in full remission: Secondary | ICD-10-CM | POA: Diagnosis not present

## 2018-05-01 DIAGNOSIS — K219 Gastro-esophageal reflux disease without esophagitis: Secondary | ICD-10-CM | POA: Diagnosis not present

## 2018-05-01 DIAGNOSIS — Z23 Encounter for immunization: Secondary | ICD-10-CM | POA: Diagnosis not present

## 2018-05-01 DIAGNOSIS — Z1211 Encounter for screening for malignant neoplasm of colon: Secondary | ICD-10-CM

## 2018-05-01 DIAGNOSIS — I10 Essential (primary) hypertension: Secondary | ICD-10-CM

## 2018-05-01 DIAGNOSIS — M542 Cervicalgia: Secondary | ICD-10-CM | POA: Diagnosis not present

## 2018-05-01 DIAGNOSIS — L989 Disorder of the skin and subcutaneous tissue, unspecified: Secondary | ICD-10-CM | POA: Insufficient documentation

## 2018-05-01 DIAGNOSIS — I2584 Coronary atherosclerosis due to calcified coronary lesion: Secondary | ICD-10-CM

## 2018-05-01 DIAGNOSIS — G8929 Other chronic pain: Secondary | ICD-10-CM

## 2018-05-01 MED ORDER — ATORVASTATIN CALCIUM 20 MG PO TABS: 20.0000 mg | ORAL_TABLET | Freq: Every day | ORAL | 1 refills | Status: DC

## 2018-05-01 MED ORDER — PANTOPRAZOLE SODIUM 40 MG PO TBEC: 40.0000 mg | DELAYED_RELEASE_TABLET | Freq: Every day | ORAL | 1 refills | Status: DC

## 2018-05-01 MED ORDER — METOPROLOL SUCCINATE ER 25 MG PO TB24: ORAL_TABLET | ORAL | 1 refills | Status: DC

## 2018-05-01 MED ORDER — ASPIRIN EC 81 MG PO TBEC: 81.0000 mg | DELAYED_RELEASE_TABLET | Freq: Every day | ORAL | 1 refills | Status: DC

## 2018-05-01 MED ORDER — PREDNISONE 20 MG PO TABS: 20.0000 mg | ORAL_TABLET | Freq: Two times a day (BID) | ORAL | 0 refills | Status: DC

## 2018-05-01 MED ORDER — LOSARTAN POTASSIUM-HCTZ 100-12.5 MG PO TABS: 1.0000 | ORAL_TABLET | Freq: Every day | ORAL | 1 refills | Status: DC

## 2018-05-01 MED ORDER — CITALOPRAM HYDROBROMIDE 20 MG PO TABS: 20.0000 mg | ORAL_TABLET | Freq: Every day | ORAL | 1 refills | Status: DC

## 2018-05-01 MED ORDER — LORATADINE 10 MG PO TABS: 10.0000 mg | ORAL_TABLET | Freq: Every day | ORAL | 1 refills | Status: DC

## 2018-05-01 MED ORDER — CLARITHROMYCIN 500 MG PO TABS: 500.0000 mg | ORAL_TABLET | Freq: Two times a day (BID) | ORAL | 0 refills | Status: DC

## 2018-05-01 NOTE — Telephone Encounter (Signed)
Message from [place] sent at 05/01/2018 12:04 PM EST   [facility] called to advise that pt takes citalopram (CELEXA) 20 MG tablet and a Rx for clarithromycin (BIAXIN) 500 MG tablet was just submitted. The [facility] agent states there is a reaction for the two medications and she wanted to make the doctor aware. Cb# [phone]    Reason for Disposition . Caller has URGENT medication question about med that PCP prescribed and triager unable to answer question  Protocols used: MEDICATION QUESTION CALL-A-AH

## 2018-05-01 NOTE — Telephone Encounter (Signed)
Spoke with [facility] and informed them Dr. Ancil Boozer recommended the patient to only take half of her Celexa daily while taking the antibiotic. After finishing the antibiotic resume back to one daily. Also to take them separately the Celexa and Biaxin. Also left a message on the patient's home and cell phone with the same directions.

## 2018-05-01 NOTE — Patient Instructions (Signed)
Start lipitor ( Atorvastatin )after you finish taking antibiotics

## 2018-05-01 NOTE — Addendum Note (Signed)
Addended by: Steele Sizer F on: 05/01/2018 12:30 PM   Modules accepted: Orders

## 2018-05-01 NOTE — Progress Notes (Addendum)
Name: Terri Burns   MRN: [id number]    DOB: 1951/05/06   Date:05/01/2018       Progress Note  Subjective  Chief Complaint  Chief Complaint  Patient presents with  . CT Results  . Wheezing    Onset-1 week, achy  . Nasal Congestion    Has been coughing up green mucus- has been taking her inhalers and nebulizer with no relief  . Medication Refill  . Hypertension    Edema in her feet when she ran out of her medication  . Depression  . COPD    Has been controlled until the last few days  . Gastroesophageal Reflux    Has been well controlled w Protonix    HPI    AR: she states long history of allergies, she ran out of loratadine and has noticed rhinorrhea and itchy throat since she has been out of medication  Chronic neck pain: she has been on disability since 2007, going to [facility] now, had x-ray and MRI done, she has DDD.  She states pain has been worse since 06/2017, causing so much discomfort that she has not been doing much around her house, staying still and has gained weight. Pain is constant but worse with activity. Tried meloxicam but made her dizzy, currently taking celebrex and tizanidine prn.   HPV positive: explained that she needs to return for pap smear - we will do it on her next visit   HTN: she has a history of hyonatremia secondary to diuretics, she is on losartan hctz lower dose , denies side effects. No chest pain or palpitation  COPD exacerbation: CT showed emphysema, she states past 4 days she has noticed worsening of cough, it is productive green in color, no blood in sputum, she had chills over the weekend but did not check her temperature. She denies SOB . She is on Trelegy and uses prn albuterol.  Last flare was 2 years ago.  Major depression: she states under control with medication. In remission  Atherosclerosis aorta and coronary vessels, seen on CT chest : discussed results with patient she is willing to take aspirin and atorvastatin     Patient Active Problem List   Diagnosis Date Noted  . Atherosclerosis of aorta ([place]) 03/04/2018  . Coronary artery calcification of native artery 03/04/2018  . 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 Chronic pain 01/01/2018  . Benign essential HTN 01/01/2018  . Senile purpura ([place]) 01/01/2018  . Recurrent major depressive disorder, in full remission ([place]) 01/01/2018  . Hyperglycemia 09/27/2016  . Tobacco abuse 08/20/2015  . Allergic rhinitis, seasonal 05/12/2015  . Emphysema lung ([place]) 05/12/2015  . Degeneration of intervertebral disc of cervical region 05/12/2015  . GERD (gastroesophageal reflux disease) 05/12/2015  . Major depression in remission ([place]) 05/12/2015    Past Surgical History:  Procedure Laterality Date  . NECK SURGERY N/A 10/2003    Family History  Problem Relation Age of Onset  . Hypertension Mother     Social History   Socioeconomic History  . Marital status: Widowed    Spouse name: Not on file  . Number of children: 3  . Years of education: Not on file  . Highest education level: Some college, no degree  Occupational History  . Occupation: disabled     Comment: 2007 - DDD cervical spine  Social Needs  . Financial resource strain: Hard  . Food insecurity:    Worry: Sometimes true    Inability: Sometimes true  .  Transportation needs:    Medical: No    Non-medical: No  Tobacco Use  . Smoking status: Current Every Day Smoker    Packs/day: 1.00    Years: 45.00    Pack years: 45.00    Types: Cigarettes  . Smokeless tobacco: Never Used  Substance and Sexual Activity  . Alcohol use: No    Alcohol/week: 0.0 standard drinks  . Drug use: No  . Sexual activity: Not Currently  Lifestyle  . Physical activity:    Days per week: 3 days    Minutes per session: Not on file  . Stress: Not at all  Relationships  . Social connections:    Talks on phone: More than three times a week    Gets together: More than three times a week     Attends religious service: Never    Active member of club or organization: No    Attends meetings of clubs or organizations: Never    Relationship status: Widowed  . Intimate partner violence:    Fear of current or ex partner: No    Emotionally abused: No    Physically abused: No    Forced sexual activity: No  Other Topics Concern  . Not on file  Social History Narrative   Lives in a senior citizen complex and goes out with her friends     Current Outpatient Medications:  .  albuterol (VENTOLIN HFA) 108 (90 Base) MCG/ACT inhaler, Inhale 2 puffs into the lungs every 6 (six) hours as needed for wheezing or shortness of breath., Disp: 18 Inhaler, Rfl: 2 .  celecoxib (CELEBREX) 200 MG capsule, Take 1 capsule by mouth as needed., Disp: , Rfl:  .  citalopram (CELEXA) 20 MG tablet, Take 1 tablet (20 mg total) by mouth daily., Disp: 90 tablet, Rfl: 1 .  fluticasone (FLONASE) 50 MCG/ACT nasal spray, SPRAY 2 SPRAYS INTO EACH NOSTRIL EVERY DAY, Disp: 16 g, Rfl: 2 .  ipratropium-albuterol (DUONEB) 0.5-2.5 (3) MG/3ML SOLN, TAKE 3 MLS BY NEBULIZATION EVERY 6 (SIX) HOURS AS NEEDED., Disp: 360 mL, Rfl: 0 .  loratadine (CLARITIN) 10 MG tablet, Take 1 tablet (10 mg total) by mouth daily., Disp: 90 tablet, Rfl: 1 .  losartan-hydrochlorothiazide (HYZAAR) 100-12.5 MG tablet, Take 1 tablet by mouth daily. In place of lisinopril hctz, Disp: 90 tablet, Rfl: 1 .  metoprolol succinate (TOPROL-XL) 25 MG 24 hr tablet, TAKE 1 TABLET (25 MG TOTAL) BY MOUTH DAILY., Disp: 90 tablet, Rfl: 1 .  pantoprazole (PROTONIX) 40 MG tablet, Take 1 tablet (40 mg total) by mouth daily., Disp: 90 tablet, Rfl: 1 .  tiZANidine (ZANAFLEX) 2 MG tablet, Take 1 tablet (2 mg total) by mouth every evening., Disp: 30 tablet, Rfl: 0 .  TRELEGY ELLIPTA 100-62.5-25 MCG/INH AEPB, INHALE 1 PUFF INTO THE LUNGS DAILY. IN PLACE OF SYMBICORT, Disp: 60 each, Rfl: 2 .  aspirin EC 81 MG tablet, Take 1 tablet (81 mg total) by mouth daily., Disp: 90  tablet, Rfl: 1 .  atorvastatin (LIPITOR) 20 MG tablet, Take 1 tablet (20 mg total) by mouth daily., Disp: 90 tablet, Rfl: 1  Allergies  Allergen Reactions  . Levofloxacin Shortness Of Breath  . Penicillins Hives    itching  . Meloxicam     dizziness    I personally reviewed active problem list, medication list, allergies, family history, social history with the patient/caregiver today.   ROS  Constitutional: Negative for fever, positive for  weight change.  Respiratory: Positive for cough but no  shortness of breath.   Cardiovascular: Negative for chest pain or palpitations.  Gastrointestinal: Negative for abdominal pain, no bowel changes.  Musculoskeletal: Negative for gait problem or joint swelling.  Skin: Negative for rash.  Neurological: Negative for dizziness, positive for intermittent  headache.  No other specific complaints in a complete review of systems (except as listed in HPI above).  Objective  Vitals:   05/01/18 1109  BP: 110/68  Pulse: 72  Resp: 16  Temp: 98.2 F (36.8 C)  TempSrc: Oral  SpO2: 99%  Weight: 131 lb 9.6 oz (59.7 kg)  Height: 5' (1.524 m)    Body mass index is 25.7 kg/m.  Physical Exam  Constitutional: Patient appears well-developed and well-nourished.  No distress.  HEENT: head atraumatic, normocephalic, pupils equal and reactive to light, neck supple, throat within normal limits Cardiovascular: Normal rate, regular rhythm and normal heart sounds.  No murmur heard. No BLE edema. Pulmonary/Chest: Effort normal , coughing, she has some coarse crackle and rhonchi ( she wants to try antibiotics before getting CXR)  No respiratory distress. Skin: skin growth on right temporal area Abdominal: Soft.  There is no tenderness. Psychiatric: Patient has a normal mood and affect. behavior is normal. Judgment and thought content normal.   PHQ2/9: Depression screen [facility] 2/9 01/01/2018 11/22/2017 06/29/2017 03/29/2017 09/27/2016  Decreased Interest 0 1 0  0 0  Down, Depressed, Hopeless 0 0 0 0 0  PHQ - 2 Score 0 1 0 0 0  Altered sleeping 0 0 - - -  Tired, decreased energy 1 2 - - -  Change in appetite 0 1 - - -  Feeling bad or failure about yourself  0 0 - - -  Trouble concentrating 0 0 - - -  Moving slowly or fidgety/restless 0 0 - - -  Suicidal thoughts 0 0 - - -  PHQ-9 Score 1 4 - - -  Difficult doing work/chores Not difficult at all Somewhat difficult - - -     Fall Risk: Fall Risk  05/01/2018 01/01/2018 06/29/2017 03/29/2017 09/27/2016  Falls in the past year? 0 No No No No  Number falls in past yr: 0 - - - -  Risk for fall due to : - - - Impaired balance/gait -  Risk for fall due to: Comment - - - staggered gait -     Functional Status Survey: Is the patient deaf or have difficulty hearing?: No Does the patient have difficulty seeing, even when wearing glasses/contacts?: No Does the patient have difficulty concentrating, remembering, or making decisions?: No Does the patient have difficulty walking or climbing stairs?: No Does the patient have difficulty dressing or bathing?: No Does the patient have difficulty doing errands alone such as visiting a doctor's office or shopping?: No    Assessment & Plan   1. Centrilobular emphysema ([place])  Per CT scam explained the importance of quitting smoking   2. Atherosclerosis of aorta (HCC)  - aspirin EC 81 MG tablet; Take 1 tablet (81 mg total) by mouth daily.  Dispense: 90 tablet; Refill: 1 - atorvastatin (LIPITOR) 20 MG tablet; Take 1 tablet (20 mg total) by mouth daily.  Dispense: 90 tablet; Refill: 1  3. Need flu vaccine  She will return next week, she wants to wait until after holiday   4. Need for vaccination for pneumococcus  She will come back after thanksgiving   5. Recurrent major depressive disorder, in full remission ([place])  - citalopram (CELEXA) 20 MG tablet; Take  1 tablet (20 mg total) by mouth daily.  Dispense: 90 tablet; Refill: 1  6. Perennial allergic  rhinitis  - loratadine (CLARITIN) 10 MG tablet; Take 1 tablet (10 mg total) by mouth daily.  Dispense: 90 tablet; Refill: 1  7. Essential hypertension  - losartan-hydrochlorothiazide (HYZAAR) 100-12.5 MG tablet; Take 1 tablet by mouth daily. In place of lisinopril hctz  Dispense: 90 tablet; Refill: 1 - metoprolol succinate (TOPROL-XL) 25 MG 24 hr tablet; TAKE 1 TABLET (25 MG TOTAL) BY MOUTH DAILY.  Dispense: 90 tablet; Refill: 1  8. Gastroesophageal reflux disease, esophagitis presence not specified  - pantoprazole (PROTONIX) 40 MG tablet; Take 1 tablet (40 mg total) by mouth daily.  Dispense: 90 tablet; Refill: 1  9. Colon cancer screening  - Cologuard  10. Chronic neck pain  Going to [facility]   11. Coronary artery calcification of native artery  - aspirin EC 81 MG tablet; Take 1 tablet (81 mg total) by mouth daily.  Dispense: 90 tablet; Refill: 1 - atorvastatin (LIPITOR) 20 MG tablet; Take 1 tablet (20 mg total) by mouth daily.  Dispense: 90 tablet; Refill: 1  12. COPD with acute exacerbation (HCC)  - clarithromycin (BIAXIN) 500 MG tablet; Take 1 tablet (500 mg total) by mouth 2 (two) times daily.  Dispense: 14 tablet; Refill: 0 - predniSONE (DELTASONE) 20 MG tablet; Take 1 tablet (20 mg total) by mouth 2 (two) times daily.  Dispense: 10 tablet; Refill: 0  13. Skin lesion of face  - Ambulatory referral to Dermatology

## 2018-05-02 ENCOUNTER — Telehealth: Payer: Self-pay | Admitting: Family Medicine

## 2018-05-02 NOTE — Telephone Encounter (Signed)
Copied from [place] [id number]. Topic: General - Other >> May 02, 2018 11:56 AM Keene Breath wrote: Reason for CRM: Thad with [employer] called to inform the doctor that they have two orders for the cologard testing, one from Dr. Ancil Boozer and one from [facility]  The orders are exact and Thad stated that they will cancel the order from Dr. Ancil Boozer and keep the one from [facility].  Please advise and call back if there are any questions.  CB# [phone]

## 2018-05-16 ENCOUNTER — Other Ambulatory Visit: Payer: Self-pay

## 2018-05-16 MED ORDER — TELMISARTAN-HCTZ 80-12.5 MG PO TABS: 1.0000 | ORAL_TABLET | Freq: Every day | ORAL | 0 refills | Status: DC

## 2018-05-21 ENCOUNTER — Other Ambulatory Visit: Payer: Self-pay

## 2018-05-21 MED ORDER — HYDROCHLOROTHIAZIDE 12.5 MG PO CAPS: 12.5000 mg | ORAL_CAPSULE | Freq: Every day | ORAL | 1 refills | Status: DC

## 2018-05-21 MED ORDER — LOSARTAN POTASSIUM 100 MG PO TABS: 100.0000 mg | ORAL_TABLET | Freq: Every day | ORAL | 1 refills | Status: DC

## 2018-05-21 NOTE — Telephone Encounter (Signed)
[facility] on back order wants split up

## 2018-06-25 DIAGNOSIS — L578 Other skin changes due to chronic exposure to nonionizing radiation: Secondary | ICD-10-CM | POA: Diagnosis not present

## 2018-06-25 DIAGNOSIS — D692 Other nonthrombocytopenic purpura: Secondary | ICD-10-CM | POA: Diagnosis not present

## 2018-06-25 DIAGNOSIS — L820 Inflamed seborrheic keratosis: Secondary | ICD-10-CM | POA: Diagnosis not present

## 2018-06-25 DIAGNOSIS — L821 Other seborrheic keratosis: Secondary | ICD-10-CM | POA: Diagnosis not present

## 2018-07-15 ENCOUNTER — Emergency Department
Admission: EM | Admit: 2018-07-15 | Discharge: 2018-07-15 | Disposition: A | Discharge status: Home / Self Care | Payer: Medicare Other | Attending: Emergency Medicine | Admitting: Emergency Medicine

## 2018-07-15 ENCOUNTER — Other Ambulatory Visit: Payer: Self-pay

## 2018-07-15 ENCOUNTER — Emergency Department: Payer: Medicare Other

## 2018-07-15 DIAGNOSIS — Z79899 Other long term (current) drug therapy: Secondary | ICD-10-CM | POA: Insufficient documentation

## 2018-07-15 DIAGNOSIS — Y998 Other external cause status: Secondary | ICD-10-CM | POA: Diagnosis not present

## 2018-07-15 DIAGNOSIS — I10 Essential (primary) hypertension: Secondary | ICD-10-CM | POA: Diagnosis not present

## 2018-07-15 DIAGNOSIS — F17210 Nicotine dependence, cigarettes, uncomplicated: Secondary | ICD-10-CM | POA: Insufficient documentation

## 2018-07-15 DIAGNOSIS — S99921A Unspecified injury of right foot, initial encounter: Secondary | ICD-10-CM | POA: Diagnosis not present

## 2018-07-15 DIAGNOSIS — W208XXA Other cause of strike by thrown, projected or falling object, initial encounter: Secondary | ICD-10-CM | POA: Diagnosis not present

## 2018-07-15 DIAGNOSIS — Y9389 Activity, other specified: Secondary | ICD-10-CM | POA: Diagnosis not present

## 2018-07-15 DIAGNOSIS — Y929 Unspecified place or not applicable: Secondary | ICD-10-CM | POA: Diagnosis not present

## 2018-07-15 DIAGNOSIS — S9031XA Contusion of right foot, initial encounter: Secondary | ICD-10-CM | POA: Diagnosis not present

## 2018-07-15 DIAGNOSIS — Z7982 Long term (current) use of aspirin: Secondary | ICD-10-CM | POA: Diagnosis not present

## 2018-07-15 DIAGNOSIS — J449 Chronic obstructive pulmonary disease, unspecified: Secondary | ICD-10-CM | POA: Insufficient documentation

## 2018-07-15 MED ORDER — HYDROCODONE-ACETAMINOPHEN 5-325 MG PO TABS: 1.0000 | ORAL_TABLET | Freq: Four times a day (QID) | ORAL | 0 refills | Status: DC | PRN

## 2018-07-15 NOTE — ED Provider Notes (Signed)
[facility] Emergency Department Provider Note   ____________________________________________   First MD Initiated Contact with Patient 07/15/18 0827     (approximate)  I have reviewed the triage vital signs and the nursing notes.   HISTORY  Chief Complaint Foot Pain   HPI Terri Burns is a 68 y.o. female   presents to the ED today with complaint of right foot pain after she dropped a 2 L Pepsi on her foot from approximately a three-foot counter.  Patient has continued to have swelling and bruising to her toes.  Pain is unrelieved by ibuprofen which she has taken twice.  She denies any previous injury to her foot.  Currently she rates her pain as a 9/10.   Past Medical History:  Diagnosis Date  . COPD (chronic obstructive pulmonary disease) ([place])   . Depression   . GERD (gastroesophageal reflux disease)   . Hyperlipidemia   . Hypertension     Patient Active Problem List   Diagnosis Date Noted  . Skin lesion of face 05/01/2018  . Atherosclerosis of aorta ([place]) 03/04/2018  . Coronary artery calcification of native artery 03/04/2018  . 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 Chronic pain 01/01/2018  . Benign essential HTN 01/01/2018  . Senile purpura ([place]) 01/01/2018  . Recurrent major depressive disorder, in full remission ([place]) 01/01/2018  . Hyperglycemia 09/27/2016  . Tobacco abuse 08/20/2015  . Allergic rhinitis, seasonal 05/12/2015  . Emphysema lung ([place]) 05/12/2015  . Degeneration of intervertebral disc of cervical region 05/12/2015  . GERD (gastroesophageal reflux disease) 05/12/2015  . Major depression in remission ([place]) 05/12/2015    Past Surgical History:  Procedure Laterality Date  . NECK SURGERY N/A 10/2003    Prior to Admission medications   Medication Sig Start Date End Date Taking? Authorizing Provider  albuterol (VENTOLIN HFA) 108 (90 Base) MCG/ACT inhaler Inhale 2 puffs into the lungs every 6 (six) hours as  needed for wheezing or shortness of breath. 11/22/17   Poulose, Bethel Born, NP  aspirin EC 81 MG tablet Take 1 tablet (81 mg total) by mouth daily. 05/01/18   Steele Sizer, MD  atorvastatin (LIPITOR) 20 MG tablet Take 1 tablet (20 mg total) by mouth daily. 05/01/18   Steele Sizer, MD  celecoxib (CELEBREX) 200 MG capsule Take 1 capsule by mouth as needed.    Nadene Rubins, DO  citalopram (CELEXA) 20 MG tablet Take 1 tablet (20 mg total) by mouth daily. 05/01/18   Steele Sizer, MD  fluticasone (FLONASE) 50 MCG/ACT nasal spray SPRAY 2 SPRAYS INTO EACH NOSTRIL EVERY DAY 04/01/18   Ancil Boozer, Drue Stager, MD  hydrochlorothiazide (MICROZIDE) 12.5 MG capsule Take 1 capsule (12.5 mg total) by mouth daily. 05/21/18   Steele Sizer, MD  HYDROcodone-acetaminophen (NORCO/VICODIN) 5-325 MG tablet Take 1 tablet by mouth every 6 (six) hours as needed for moderate pain. 07/15/18   Letitia Neri L, PA-C  ipratropium-albuterol (DUONEB) 0.5-2.5 (3) MG/3ML SOLN TAKE 3 MLS BY NEBULIZATION EVERY 6 (SIX) HOURS AS NEEDED. 06/08/17   Roselee Nova, MD  loratadine (CLARITIN) 10 MG tablet Take 1 tablet (10 mg total) by mouth daily. 05/01/18   Steele Sizer, MD  losartan (COZAAR) 100 MG tablet Take 1 tablet (100 mg total) by mouth daily. 05/21/18   Steele Sizer, MD  metoprolol succinate (TOPROL-XL) 25 MG 24 hr tablet TAKE 1 TABLET (25 MG TOTAL) BY MOUTH DAILY. 05/01/18   Steele Sizer, MD  pantoprazole (PROTONIX) 40 MG tablet Take  1 tablet (40 mg total) by mouth daily. 05/01/18   Steele Sizer, MD  telmisartan-hydrochlorothiazide (MICARDIS HCT) 80-12.5 MG tablet Take 1 tablet by mouth daily. 05/16/18   Steele Sizer, MD  tiZANidine (ZANAFLEX) 2 MG tablet Take 1 tablet (2 mg total) by mouth every evening. 11/22/17   Poulose, Bethel Born, NP  TRELEGY ELLIPTA 100-62.5-25 MCG/INH AEPB INHALE 1 PUFF INTO THE LUNGS DAILY. IN PLACE OF SYMBICORT 04/01/18   Steele Sizer, MD    Allergies Levofloxacin;  Penicillins; and Meloxicam  Family History  Problem Relation Age of Onset  . Hypertension Mother     Social History Social History   Tobacco Use  . Smoking status: Current Every Day Smoker    Packs/day: 1.00    Years: 45.00    Pack years: 45.00    Types: Cigarettes  . Smokeless tobacco: Never Used  Substance Use Topics  . Alcohol use: No    Alcohol/week: 0.0 standard drinks  . Drug use: No    Review of Systems Constitutional: No fever/chills Cardiovascular: Denies chest pain. Respiratory: Denies shortness of breath. Musculoskeletal: Positive for right foot pain. Skin: Positive for ecchymosis and edema. Neurological: Negative for headaches, focal weakness or numbness. ____________________________________________   PHYSICAL EXAM:  VITAL SIGNS: ED Triage Vitals  Enc Vitals Group     BP 07/15/18 0817 (!) 172/87     Pulse Rate 07/15/18 0817 64     Resp 07/15/18 0817 20     Temp 07/15/18 0817 98 F (36.7 C)     Temp Source 07/15/18 0817 Oral     SpO2 07/15/18 0817 100 %     Weight 07/15/18 0815 130 lb (59 kg)     Height 07/15/18 0815 5' (1.524 m)     Head Circumference --      Peak Flow --      Pain Score 07/15/18 0815 9     Pain Loc --      Pain Edu? --      Excl. in [place]? --    Constitutional: Alert and oriented. Well appearing and in no acute distress. Eyes: Conjunctivae are normal.  Head: Atraumatic. Neck: No stridor.   Cardiovascular: Normal rate, regular rhythm. Grossly normal heart sounds.  Good peripheral circulation. Respiratory: Normal respiratory effort.  No retractions. Lungs CTAB. Gastrointestinal: Soft and nontender. No distention.  Musculoskeletal: On examination of right foot there is ecchymosis noted to the second digit with moderate swelling.  Markedly tender.  There is some soft tissue swelling to the remainder of the digits with the second toe being worse.  No nail damage is seen.  Capillary refill is less than 3 seconds and pulses present.  No  skin abrasions or evidence of bleeding present.  Motor sensory function intact. Neurologic:  Normal speech and language. No gross focal neurologic deficits are appreciated.  Skin:  Skin is warm, dry and intact. No rash noted. Psychiatric: Mood and affect are normal. Speech and behavior are normal.  ____________________________________________   LABS (all labs ordered are listed, but only abnormal results are displayed)  Labs Reviewed - No data to display  RADIOLOGY  ED MD interpretation:  Right foot negative for fractures.  Official radiology report(s): Dg Foot Complete Right  Result Date: 07/15/2018 CLINICAL DATA:  Dropped object on foot last night. Unable to bear weight. EXAM: RIGHT FOOT COMPLETE - 3+ VIEW COMPARISON:  None. FINDINGS: The joint spaces are maintained. No acute fracture is identified. Mild calcaneal spurring changes are noted. IMPRESSION: No acute  bony findings. Electronically Signed   By: Marijo Sanes M.D.   On: 07/15/2018 09:13    ____________________________________________   PROCEDURES  Procedure(s) performed: Ace wrap and postop shoe was applied to the right foot for support and protection.  Procedures  Critical Care performed: No  ____________________________________________   INITIAL IMPRESSION / ASSESSMENT AND PLAN / ED COURSE  As part of my medical decision making, I reviewed the following data within the electronic MEDICAL RECORD NUMBER Notes from prior ED visits and [place] Controlled Substance Database  Patient was made aware to follow-up with her PCP if any continued problems.  She was given a prescription for Norco as needed for moderate to severe pain.  Patient was made aware that this medication could cause drowsiness and knows that she cannot drive or operate machinery while taking this medication.  X-ray was negative for fracture.  ____________________________________________   FINAL CLINICAL IMPRESSION(S) / ED DIAGNOSES  Final diagnoses:    Contusion of right foot, initial encounter     ED Discharge Orders         Ordered    HYDROcodone-acetaminophen (NORCO/VICODIN) 5-325 MG tablet  Every 6 hours PRN     07/15/18 0929           Note:  This document was prepared using Dragon voice recognition software and may include unintentional dictation errors.    Johnn Hai, PA-C 07/15/18 1115    Delman Kitten, MD 07/15/18 [id number]

## 2018-07-15 NOTE — ED Notes (Signed)
See triage note  States she had a pepsi drop on right foot  Having increased apin to top of foot  No deformity noted  But unable to bear wt

## 2018-07-15 NOTE — Discharge Instructions (Addendum)
Follow-up with your primary care provider if any continued problems.  Ice and elevation to reduce swelling.  Wear the postop shoe that was given to you in the ED today when you are walking to prevent your toes from bending which will help a great deal.  If additional pain medication is needed with the Norco you may take ibuprofen.  Norco contains a narcotic and should not be taken while driving or up walking for extended periods of time.

## 2018-07-15 NOTE — ED Triage Notes (Signed)
Pt c/o right foot pain since yesterday - she knocked a pepsi down and it hit her in the top of her foot

## 2018-07-25 ENCOUNTER — Other Ambulatory Visit: Payer: Self-pay | Admitting: Family Medicine

## 2018-08-01 ENCOUNTER — Ambulatory Visit: Payer: Medicare Other | Admitting: Family Medicine

## 2018-08-09 ENCOUNTER — Encounter: Payer: Self-pay | Admitting: *Deleted

## 2018-08-16 ENCOUNTER — Emergency Department: Payer: Medicare Other

## 2018-08-16 ENCOUNTER — Emergency Department
Admission: EM | Admit: 2018-08-16 | Discharge: 2018-08-16 | Disposition: A | Discharge status: Home / Self Care | Payer: Medicare Other | Attending: Emergency Medicine | Admitting: Emergency Medicine

## 2018-08-16 ENCOUNTER — Encounter: Payer: Self-pay | Admitting: *Deleted

## 2018-08-16 ENCOUNTER — Other Ambulatory Visit: Payer: Self-pay

## 2018-08-16 DIAGNOSIS — S0181XA Laceration without foreign body of other part of head, initial encounter: Secondary | ICD-10-CM | POA: Insufficient documentation

## 2018-08-16 DIAGNOSIS — I10 Essential (primary) hypertension: Secondary | ICD-10-CM | POA: Insufficient documentation

## 2018-08-16 DIAGNOSIS — Y9389 Activity, other specified: Secondary | ICD-10-CM | POA: Diagnosis not present

## 2018-08-16 DIAGNOSIS — F17210 Nicotine dependence, cigarettes, uncomplicated: Secondary | ICD-10-CM | POA: Diagnosis not present

## 2018-08-16 DIAGNOSIS — Y999 Unspecified external cause status: Secondary | ICD-10-CM | POA: Diagnosis not present

## 2018-08-16 DIAGNOSIS — S0990XA Unspecified injury of head, initial encounter: Secondary | ICD-10-CM | POA: Diagnosis present

## 2018-08-16 DIAGNOSIS — S63001A Unspecified subluxation of right wrist and hand, initial encounter: Secondary | ICD-10-CM | POA: Diagnosis not present

## 2018-08-16 DIAGNOSIS — S12112A Nondisplaced Type II dens fracture, initial encounter for closed fracture: Secondary | ICD-10-CM | POA: Diagnosis not present

## 2018-08-16 DIAGNOSIS — Y929 Unspecified place or not applicable: Secondary | ICD-10-CM | POA: Diagnosis not present

## 2018-08-16 DIAGNOSIS — S12190A Other displaced fracture of second cervical vertebra, initial encounter for closed fracture: Secondary | ICD-10-CM | POA: Diagnosis not present

## 2018-08-16 DIAGNOSIS — W01190A Fall on same level from slipping, tripping and stumbling with subsequent striking against furniture, initial encounter: Secondary | ICD-10-CM | POA: Insufficient documentation

## 2018-08-16 DIAGNOSIS — J449 Chronic obstructive pulmonary disease, unspecified: Secondary | ICD-10-CM | POA: Diagnosis not present

## 2018-08-16 DIAGNOSIS — R600 Localized edema: Secondary | ICD-10-CM | POA: Diagnosis not present

## 2018-08-16 DIAGNOSIS — Z7982 Long term (current) use of aspirin: Secondary | ICD-10-CM | POA: Diagnosis not present

## 2018-08-16 DIAGNOSIS — M79641 Pain in right hand: Secondary | ICD-10-CM | POA: Insufficient documentation

## 2018-08-16 DIAGNOSIS — S12100A Unspecified displaced fracture of second cervical vertebra, initial encounter for closed fracture: Secondary | ICD-10-CM | POA: Diagnosis not present

## 2018-08-16 DIAGNOSIS — Z79899 Other long term (current) drug therapy: Secondary | ICD-10-CM | POA: Diagnosis not present

## 2018-08-16 MED ORDER — BACITRACIN ZINC 500 UNIT/GM EX OINT
TOPICAL_OINTMENT | Freq: Once | CUTANEOUS | Status: AC
  Administered 2018-08-16: 1 via TOPICAL
  Filled 2018-08-16: qty 0.9

## 2018-08-16 NOTE — ED Notes (Signed)
Pt's care discussed with Dr. Joni Fears, see orders for details.

## 2018-08-16 NOTE — ED Triage Notes (Signed)
Pt tripped over a throw rug and struck her head on a dresser.  Pt has a large L shaped laceration (approx 3in by 3inch) to her right forehead.  No LOC with this. Pt is alert and oriented.  Bleeding is controlled at this time.

## 2018-08-16 NOTE — ED Notes (Signed)
Pt is being discharged to home with family. Aox4, VSS, pt does not c/o any pain at this time. AVS was given and explained to the pt and she verbalized understanding of all information.

## 2018-08-16 NOTE — ED Notes (Signed)
Pt is on aspirin, not on any blood thinners

## 2018-08-16 NOTE — Discharge Instructions (Addendum)
Follow-up with Dr. Cari Caraway.  You will be contacted by his office to schedule the appointment.  You should also call his office if your collar is not fitting well.  Keep the collar on at all times except when showering until you follow-up.  Return here in 1 week for suture removal.  Return immediately to the emergency department for new or worsening headache, dizziness, neck pain, weakness or numbness, or any other new or worsening symptoms that concern you.

## 2018-08-16 NOTE — Consult Note (Signed)
Referring Physician:  No referring provider defined for this encounter.  Primary Physician:  Terri Sizer, MD  Chief Complaint:  Type 2 odontoid fracture  History of Present Illness: 08/16/2018 Terri Burns is a 68 y.o. female who presents with the chief complaint of fall.  During this fall, she did not have LOC.  She is at her normal mental state.    She had a mechanical fall today which resulted in hitting her head on her coffee table on the R side.  She suffered a forehead laceration.  She presented to [facility], where CT showed Type 2 odontoid fracture, prompting consultation.  She denies any change in strength, sensation, or any other neurological symptom.  She denies HA N V.   Review of Systems:  A 10 point review of systems is negative, except for the pertinent positives and negatives detailed in the HPI.  Past Medical History: Past Medical History:  Diagnosis Date  . COPD (chronic obstructive pulmonary disease) ([place])   . Depression   . GERD (gastroesophageal reflux disease)   . Hyperlipidemia   . Hypertension     Past Surgical History: Past Surgical History:  Procedure Laterality Date  . NECK SURGERY N/A 10/2003    Allergies: Allergies as of 08/16/2018 - Review Complete 08/16/2018  Allergen Reaction Noted  . Levofloxacin Shortness Of Breath 05/12/2015  . Penicillins Hives 05/12/2015  . Meloxicam  01/01/2018    Medications: No current facility-administered medications for this encounter.   Current Outpatient Medications:  .  albuterol (VENTOLIN HFA) 108 (90 Base) MCG/ACT inhaler, Inhale 2 puffs into the lungs every 6 (six) hours as needed for wheezing or shortness of breath., Disp: 18 Inhaler, Rfl: 2 .  aspirin EC 81 MG tablet, Take 1 tablet (81 mg total) by mouth daily., Disp: 90 tablet, Rfl: 1 .  atorvastatin (LIPITOR) 20 MG tablet, Take 1 tablet (20 mg total) by mouth daily., Disp: 90 tablet, Rfl: 1 .  celecoxib (CELEBREX) 200 MG capsule, Take 1 capsule  by mouth as needed., Disp: , Rfl:  .  citalopram (CELEXA) 20 MG tablet, Take 1 tablet (20 mg total) by mouth daily., Disp: 90 tablet, Rfl: 1 .  fluticasone (FLONASE) 50 MCG/ACT nasal spray, SPRAY 2 SPRAYS INTO EACH NOSTRIL EVERY DAY, Disp: 16 g, Rfl: 2 .  hydrochlorothiazide (MICROZIDE) 12.5 MG capsule, TAKE 1 CAPSULE BY MOUTH EVERY DAY, Disp: 30 capsule, Rfl: 0 .  HYDROcodone-acetaminophen (NORCO/VICODIN) 5-325 MG tablet, Take 1 tablet by mouth every 6 (six) hours as needed for moderate pain., Disp: 15 tablet, Rfl: 0 .  ipratropium-albuterol (DUONEB) 0.5-2.5 (3) MG/3ML SOLN, TAKE 3 MLS BY NEBULIZATION EVERY 6 (SIX) HOURS AS NEEDED., Disp: 360 mL, Rfl: 0 .  loratadine (CLARITIN) 10 MG tablet, Take 1 tablet (10 mg total) by mouth daily., Disp: 90 tablet, Rfl: 1 .  losartan (COZAAR) 100 MG tablet, TAKE 1 TABLET BY MOUTH EVERY DAY, Disp: 30 tablet, Rfl: 0 .  metoprolol succinate (TOPROL-XL) 25 MG 24 hr tablet, TAKE 1 TABLET (25 MG TOTAL) BY MOUTH DAILY., Disp: 90 tablet, Rfl: 1 .  pantoprazole (PROTONIX) 40 MG tablet, Take 1 tablet (40 mg total) by mouth daily., Disp: 90 tablet, Rfl: 1 .  telmisartan-hydrochlorothiazide (MICARDIS HCT) 80-12.5 MG tablet, Take 1 tablet by mouth daily., Disp: 90 tablet, Rfl: 0 .  tiZANidine (ZANAFLEX) 2 MG tablet, Take 1 tablet (2 mg total) by mouth every evening., Disp: 30 tablet, Rfl: 0 .  TRELEGY ELLIPTA 100-62.5-25 MCG/INH AEPB, INHALE 1 PUFF  INTO THE LUNGS DAILY. IN PLACE OF SYMBICORT, Disp: 60 each, Rfl: 2   Social History: Social History   Tobacco Use  . Smoking status: Current Every Day Smoker    Packs/day: 1.00    Years: 45.00    Pack years: 45.00    Types: Cigarettes  . Smokeless tobacco: Never Used  Substance Use Topics  . Alcohol use: No    Alcohol/week: 0.0 standard drinks  . Drug use: No    Family Medical History: Family History  Problem Relation Age of Onset  . Hypertension Mother     Physical Examination: Vitals:   08/16/18 1045  08/16/18 1115  BP:    Pulse: 74 91  Resp: 10 18  Temp:    SpO2: 97% 93%     General: Patient is well developed, well nourished, calm, collected, and in no apparent distress.  Psychiatric: Patient is non-anxious.  Head:  Pupils equal, round, and reactive to light. Significant R forehead laceration is currently dressed.   ENT:  Oral mucosa appears well hydrated.  Neck:   Supple.  Full range of motion.  Respiratory: Patient is breathing without any difficulty.  Extremities: No edema.  Vascular: Palpable pulses in dorsal pedal vessels.  Skin:   On exposed skin, there are no abnormal skin lesions.  NEUROLOGICAL:  General: In no acute distress.   Awake, alert, oriented to person, place, and time.  Pupils equal round and reactive to light.  Facial tone is symmetric.  Tongue protrusion is midline.  There is no pronator drift.  She is in a cervical collar.   Strength: Side Biceps Triceps Deltoid Interossei Grip Wrist Ext. Wrist Flex.  R 5 5 5 5 5 5 5   L 5 5 5 5 5 5 5    Side Iliopsoas Quads Hamstring PF DF EHL  R 5 5 5 5 5 5   L 5 5 5 5 5 5    Reflexes are 1+ and symmetric at the biceps, triceps, brachioradialis, patella and achilles.   Bilateral upper and lower extremity sensation is intact to light touch.  Clonus is not present.  Toes are down-going.  Gait is untested.  Hoffman's is absent.  Imaging: CT C spine and Head 08/16/2018 IMPRESSION: 1. Acute type 2 fracture of C2 with minimal displacement. 2. No acute intracranial abnormality. 3. RIGHT forehead laceration without underlying fracture. 4. Edema from the scalp laceration extends into the preseptal regions of both orbits. Globes are intact. 5. 2.1 centimeter nodule of the RIGHT lobe of the thyroid warranting further evaluation with ultrasound when appropriate.  Critical Value/emergent results were called by telephone at the time of interpretation on 08/16/2018 at 11:34 am to Dr. Carrie Burns , who verbally  acknowledged these results.   Electronically Signed   By: Terri Burns M.D.   On: 08/16/2018 11:34  I have personally reviewed the images and agree with the above interpretation.  Assessment and Plan: [name] is a pleasant 68 y.o. female with type 2 odontoid fracture.  She is at risk of non-union due to age and smoking.  I have advised her to quit smoking due to this and to the ongoing coronavirus epidemic.  I have offered bracing versus surgical fixation.  She would like to try bracing.  I will see her back in 2 weeks in clinic.  - Continue C collar - Discharge with pain medication and muscle relaxants - F/u 2 weeks at [place]. Izora Ribas MD, [place] Dept. of Neurosurgery

## 2018-08-16 NOTE — ED Provider Notes (Signed)
[facility] Emergency Department Provider Note ____________________________________________   First MD Initiated Contact with Patient 08/16/18 1112     (approximate)  I have reviewed the triage vital signs and the nursing notes.   HISTORY  Chief Complaint Fall    HPI Terri Burns is a 68 y.o. female with PMH as noted below who presents with a head injury, acute onset when she tripped while putting on her shoes and fell forward into a dresser and then the floor.  She states she was not feeling dizzy or lightheaded prior to the fall.  She did not pass out afterwards.  She reports mild neck pain and pain to the right hand but denies other injuries.  She has no weakness or numbness.  Past Medical History:  Diagnosis Date  . COPD (chronic obstructive pulmonary disease) ([place])   . Depression   . GERD (gastroesophageal reflux disease)   . Hyperlipidemia   . Hypertension     Patient Active Problem List   Diagnosis Date Noted  . Skin lesion of face 05/01/2018  . Atherosclerosis of aorta ([place]) 03/04/2018  . Coronary artery calcification of native artery 03/04/2018  . 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 Chronic pain 01/01/2018  . Benign essential HTN 01/01/2018  . Senile purpura ([place]) 01/01/2018  . Recurrent major depressive disorder, in full remission ([place]) 01/01/2018  . Hyperglycemia 09/27/2016  . Tobacco abuse 08/20/2015  . Allergic rhinitis, seasonal 05/12/2015  . Emphysema lung ([place]) 05/12/2015  . Degeneration of intervertebral disc of cervical region 05/12/2015  . GERD (gastroesophageal reflux disease) 05/12/2015  . Major depression in remission ([place]) 05/12/2015    Past Surgical History:  Procedure Laterality Date  . NECK SURGERY N/A 10/2003    Prior to Admission medications   Medication Sig Start Date End Date Taking? Authorizing Provider  albuterol (VENTOLIN HFA) 108 (90 Base) MCG/ACT inhaler Inhale 2 puffs into the lungs  every 6 (six) hours as needed for wheezing or shortness of breath. 11/22/17   Poulose, Bethel Born, NP  aspirin EC 81 MG tablet Take 1 tablet (81 mg total) by mouth daily. 05/01/18   Steele Sizer, MD  atorvastatin (LIPITOR) 20 MG tablet Take 1 tablet (20 mg total) by mouth daily. 05/01/18   Steele Sizer, MD  celecoxib (CELEBREX) 200 MG capsule Take 1 capsule by mouth as needed.    Nadene Rubins, DO  citalopram (CELEXA) 20 MG tablet Take 1 tablet (20 mg total) by mouth daily. 05/01/18   Steele Sizer, MD  fluticasone (FLONASE) 50 MCG/ACT nasal spray SPRAY 2 SPRAYS INTO EACH NOSTRIL EVERY DAY 04/01/18   Ancil Boozer, Drue Stager, MD  hydrochlorothiazide (MICROZIDE) 12.5 MG capsule TAKE 1 CAPSULE BY MOUTH EVERY DAY 07/25/18   Steele Sizer, MD  HYDROcodone-acetaminophen (NORCO/VICODIN) 5-325 MG tablet Take 1 tablet by mouth every 6 (six) hours as needed for moderate pain. 07/15/18   Letitia Neri L, PA-C  ipratropium-albuterol (DUONEB) 0.5-2.5 (3) MG/3ML SOLN TAKE 3 MLS BY NEBULIZATION EVERY 6 (SIX) HOURS AS NEEDED. 06/08/17   Roselee Nova, MD  loratadine (CLARITIN) 10 MG tablet Take 1 tablet (10 mg total) by mouth daily. 05/01/18   Steele Sizer, MD  losartan (COZAAR) 100 MG tablet TAKE 1 TABLET BY MOUTH EVERY DAY 07/25/18   Ancil Boozer, Drue Stager, MD  metoprolol succinate (TOPROL-XL) 25 MG 24 hr tablet TAKE 1 TABLET (25 MG TOTAL) BY MOUTH DAILY. 05/01/18   Steele Sizer, MD  pantoprazole (PROTONIX) 40 MG tablet Take 1  tablet (40 mg total) by mouth daily. 05/01/18   Steele Sizer, MD  telmisartan-hydrochlorothiazide (MICARDIS HCT) 80-12.5 MG tablet Take 1 tablet by mouth daily. 05/16/18   Steele Sizer, MD  tiZANidine (ZANAFLEX) 2 MG tablet Take 1 tablet (2 mg total) by mouth every evening. 11/22/17   Poulose, Bethel Born, NP  TRELEGY ELLIPTA 100-62.5-25 MCG/INH AEPB INHALE 1 PUFF INTO THE LUNGS DAILY. IN PLACE OF SYMBICORT 04/01/18   Steele Sizer, MD    Allergies Levofloxacin; Penicillins;  and Meloxicam  Family History  Problem Relation Age of Onset  . Hypertension Mother     Social History Social History   Tobacco Use  . Smoking status: Current Every Day Smoker    Packs/day: 1.00    Years: 45.00    Pack years: 45.00    Types: Cigarettes  . Smokeless tobacco: Never Used  Substance Use Topics  . Alcohol use: No    Alcohol/week: 0.0 standard drinks  . Drug use: No    Review of Systems  Constitutional: No fever. Eyes: No eye injury. ENT: No sore throat. Cardiovascular: Denies chest pain. Respiratory: Denies shortness of breath. Gastrointestinal: No vomiting.  Genitourinary: Negative for flank pain.  Musculoskeletal: Negative for back pain. Skin: Negative for rash. Neurological: Negative for headaches, focal weakness or numbness.   ____________________________________________   PHYSICAL EXAM:  VITAL SIGNS: ED Triage Vitals  Enc Vitals Group     BP 08/16/18 1017 139/87     Pulse Rate 08/16/18 1017 75     Resp 08/16/18 1017 16     Temp 08/16/18 1017 98.2 F (36.8 C)     Temp Source 08/16/18 1017 Oral     SpO2 08/16/18 1017 95 %     Weight 08/16/18 1018 135 lb (61.2 kg)     Height 08/16/18 1018 5\' 1"  (1.549 m)     Head Circumference --      Peak Flow --      Pain Score 08/16/18 1018 4     Pain Loc --      Pain Edu? --      Excl. in [place]? --     Constitutional: Alert and oriented.  Relatively well appearing and in no acute distress. Eyes: Conjunctivae are normal.  EOMI.  PERRLA. Head: 10 cm L-shaped laceration in two sections to forehead to level of subq Nose: No congestion/rhinnorhea. Mouth/Throat: Mucous membranes are moist.   Neck: Normal range of motion.  No significant midline cervical spinal tenderness. Cardiovascular: Normal rate, regular rhythm. Good peripheral circulation. Respiratory: Normal respiratory effort.   Gastrointestinal: No distention.  Musculoskeletal: No lower extremity edema.  Extremities warm and well perfused.  Mild  tenderness and bruising to right second and third MCP.  No midline spinal tenderness.  Full range of motion all extremities. Neurologic:  Normal speech and language.  Motor and sensory intact in all extremities.  Normal coordination with no ataxia on finger-to-nose.  No gross focal neurologic deficits are appreciated.  Skin:  Skin is warm and dry. No rash noted. Psychiatric: Mood and affect are normal. Speech and behavior are normal.  ____________________________________________   LABS (all labs ordered are listed, but only abnormal results are displayed)  Labs Reviewed - No data to display ____________________________________________  EKG   ____________________________________________  RADIOLOGY  CT head: No ICH CT cervical spine: Type II odontoid fracture  ____________________________________________   PROCEDURES  Procedure(s) performed: Yes   .Marland KitchenLaceration Repair Date/Time: 08/16/2018 1:40 PM Performed by: Arta Silence, MD Authorized by: Arta Silence,  MD   Consent:    Consent obtained:  Verbal   Consent given by:  Patient   Risks discussed:  Infection, pain, retained foreign body, poor cosmetic result and poor wound healing Anesthesia (see MAR for exact dosages):    Anesthesia method:  Local infiltration   Local anesthetic:  Lidocaine 2% WITH epi Laceration details:    Location:  Face   Face location:  Forehead   Length (cm):  10   Depth (mm):  3 Repair type:    Repair type:  Complex Exploration:    Hemostasis achieved with:  Direct pressure   Wound exploration: entire depth of wound probed and visualized     Contaminated: no   Treatment:    Area cleansed with:  Saline and Betadine   Amount of cleaning:  Extensive   Irrigation solution:  Sterile saline   Irrigation method:  Syringe   Visualized foreign bodies/material removed: no     Debridement:  Minimal   Undermining:  None Skin repair:    Repair method:  Sutures   Suture size:  5-0    Suture material:  Nylon   Suture technique:  Simple interrupted   Number of sutures:  14 Approximation:    Approximation:  Close Post-procedure details:    Dressing:  Sterile dressing   Patient tolerance of procedure:  Tolerated well, no immediate complications    Critical Care performed: No ____________________________________________   INITIAL IMPRESSION / ASSESSMENT AND PLAN / ED COURSE  Pertinent labs & imaging results that were available during my care of the patient were reviewed by me and considered in my medical decision making (see chart for details).  68 year old female with PMH as noted above presents from home after mechanical fall from standing height when she fell forward and hit a dresser and then the floor.  She had no LOC.  She reports some right hand pain but denies other injuries.  On exam the patient is relatively comfortable appearing.  Her vital signs are normal.  Neuro exam is nonfocal.  The remainder of the exam is as described above.  We will obtain CT head and cervical spine, x-ray of the right hand, repair her laceration.  ----------------------------------------- 12:14 PM on 08/16/2018 -----------------------------------------  CT head is negative for ICH.  CT cervical spine shows type II odontoid fracture.  I consulted Dr. Izora Ribas from neurosurgery who came to evaluate the patient in the ED.  He offered her surgery versus continued bracing with c-collar and she prefers the latter.  He will arrange follow-up with her.  ----------------------------------------- 1:41 PM on 08/16/2018 -----------------------------------------  Laceration was repaired successfully.  The patient is stable for discharge home at this time.  I discussed the plan of care in terms of neurosurgery follow-up and gave the patient return precautions; she expressed understanding. ____________________________________________   FINAL CLINICAL IMPRESSION(S) / ED DIAGNOSES  Final  diagnoses:  Closed nondisplaced odontoid fracture with type II morphology, initial encounter ([place])  Laceration of forehead, initial encounter      NEW MEDICATIONS STARTED DURING THIS VISIT:  New Prescriptions   No medications on file     Note:  This document was prepared using Dragon voice recognition software and may include unintentional dictation errors.    Arta Silence, MD 08/16/18 1342

## 2018-08-18 ENCOUNTER — Telehealth: Payer: Self-pay

## 2018-08-18 NOTE — Telephone Encounter (Signed)
Call pt regarding lung screening.Pt had a bad fall and has broken bones. Pt will be seeing a  neurosurgeons  in a few weeks. Pt would like for [place] to call her back in a month to a month and half. Please call [phone]. [phone] Incorrect.

## 2018-08-20 ENCOUNTER — Emergency Department: Payer: Medicare Other

## 2018-08-20 ENCOUNTER — Other Ambulatory Visit: Payer: Self-pay

## 2018-08-20 ENCOUNTER — Encounter: Payer: Self-pay | Admitting: Emergency Medicine

## 2018-08-20 ENCOUNTER — Inpatient Hospital Stay
Admission: EM | Admit: 2018-08-20 | Discharge: 2018-08-21 | DRG: 202 | Disposition: A | Discharge status: Home Health | Payer: Medicare Other | Attending: Internal Medicine | Admitting: Internal Medicine

## 2018-08-20 DIAGNOSIS — Z8249 Family history of ischemic heart disease and other diseases of the circulatory system: Secondary | ICD-10-CM

## 2018-08-20 DIAGNOSIS — S0181XS Laceration without foreign body of other part of head, sequela: Secondary | ICD-10-CM

## 2018-08-20 DIAGNOSIS — R296 Repeated falls: Secondary | ICD-10-CM | POA: Diagnosis present

## 2018-08-20 DIAGNOSIS — F17210 Nicotine dependence, cigarettes, uncomplicated: Secondary | ICD-10-CM | POA: Diagnosis present

## 2018-08-20 DIAGNOSIS — J441 Chronic obstructive pulmonary disease with (acute) exacerbation: Secondary | ICD-10-CM

## 2018-08-20 DIAGNOSIS — I700 Atherosclerosis of aorta: Secondary | ICD-10-CM | POA: Diagnosis present

## 2018-08-20 DIAGNOSIS — Y92013 Bedroom of single-family (private) house as the place of occurrence of the external cause: Secondary | ICD-10-CM

## 2018-08-20 DIAGNOSIS — Z7982 Long term (current) use of aspirin: Secondary | ICD-10-CM | POA: Diagnosis not present

## 2018-08-20 DIAGNOSIS — Z7951 Long term (current) use of inhaled steroids: Secondary | ICD-10-CM

## 2018-08-20 DIAGNOSIS — E785 Hyperlipidemia, unspecified: Secondary | ICD-10-CM | POA: Diagnosis present

## 2018-08-20 DIAGNOSIS — F329 Major depressive disorder, single episode, unspecified: Secondary | ICD-10-CM | POA: Diagnosis present

## 2018-08-20 DIAGNOSIS — T502X5A Adverse effect of carbonic-anhydrase inhibitors, benzothiadiazides and other diuretics, initial encounter: Secondary | ICD-10-CM | POA: Diagnosis present

## 2018-08-20 DIAGNOSIS — W1830XA Fall on same level, unspecified, initial encounter: Secondary | ICD-10-CM | POA: Diagnosis not present

## 2018-08-20 DIAGNOSIS — J209 Acute bronchitis, unspecified: Secondary | ICD-10-CM | POA: Diagnosis present

## 2018-08-20 DIAGNOSIS — S12110D Anterior displaced Type II dens fracture, subsequent encounter for fracture with routine healing: Secondary | ICD-10-CM | POA: Diagnosis not present

## 2018-08-20 DIAGNOSIS — J9601 Acute respiratory failure with hypoxia: Secondary | ICD-10-CM | POA: Diagnosis present

## 2018-08-20 DIAGNOSIS — Z888 Allergy status to other drugs, medicaments and biological substances status: Secondary | ICD-10-CM | POA: Diagnosis not present

## 2018-08-20 DIAGNOSIS — S0181XA Laceration without foreign body of other part of head, initial encounter: Secondary | ICD-10-CM | POA: Diagnosis not present

## 2018-08-20 DIAGNOSIS — R05 Cough: Secondary | ICD-10-CM | POA: Diagnosis not present

## 2018-08-20 DIAGNOSIS — I10 Essential (primary) hypertension: Secondary | ICD-10-CM | POA: Diagnosis present

## 2018-08-20 DIAGNOSIS — Z66 Do not resuscitate: Secondary | ICD-10-CM | POA: Diagnosis present

## 2018-08-20 DIAGNOSIS — K219 Gastro-esophageal reflux disease without esophagitis: Secondary | ICD-10-CM | POA: Diagnosis present

## 2018-08-20 DIAGNOSIS — E876 Hypokalemia: Secondary | ICD-10-CM | POA: Diagnosis not present

## 2018-08-20 DIAGNOSIS — Z79899 Other long term (current) drug therapy: Secondary | ICD-10-CM

## 2018-08-20 DIAGNOSIS — J432 Centrilobular emphysema: Secondary | ICD-10-CM | POA: Diagnosis present

## 2018-08-20 DIAGNOSIS — Z881 Allergy status to other antibiotic agents status: Secondary | ICD-10-CM

## 2018-08-20 DIAGNOSIS — S12100A Unspecified displaced fracture of second cervical vertebra, initial encounter for closed fracture: Secondary | ICD-10-CM | POA: Diagnosis not present

## 2018-08-20 DIAGNOSIS — M5030 Other cervical disc degeneration, unspecified cervical region: Secondary | ICD-10-CM

## 2018-08-20 DIAGNOSIS — G8921 Chronic pain due to trauma: Secondary | ICD-10-CM

## 2018-08-20 DIAGNOSIS — Z880 Allergy status to penicillin: Secondary | ICD-10-CM | POA: Diagnosis not present

## 2018-08-20 DIAGNOSIS — I959 Hypotension, unspecified: Secondary | ICD-10-CM | POA: Diagnosis not present

## 2018-08-20 DIAGNOSIS — M542 Cervicalgia: Secondary | ICD-10-CM | POA: Diagnosis not present

## 2018-08-20 LAB — CBC
HCT: 28 % — ABNORMAL LOW (ref 36.0–46.0)
Hemoglobin: 9 g/dL — ABNORMAL LOW (ref 12.0–15.0)
MCH: 26.9 pg (ref 26.0–34.0)
MCHC: 32.1 g/dL (ref 30.0–36.0)
MCV: 83.8 fL (ref 80.0–100.0)
Platelets: 320 10*3/uL (ref 150–400)
RBC: 3.34 MIL/uL — ABNORMAL LOW (ref 3.87–5.11)
RDW: 17.4 % — ABNORMAL HIGH (ref 11.5–15.5)
WBC: 10 10*3/uL (ref 4.0–10.5)
nRBC: 0 % (ref 0.0–0.2)

## 2018-08-20 LAB — INFLUENZA PANEL BY PCR (TYPE A & B)
Influenza A By PCR: NEGATIVE
Influenza B By PCR: NEGATIVE

## 2018-08-20 LAB — BASIC METABOLIC PANEL
Anion gap: 13 (ref 5–15)
BUN: 13 mg/dL (ref 8–23)
CO2: 22 mmol/L (ref 22–32)
Calcium: 8.2 mg/dL — ABNORMAL LOW (ref 8.9–10.3)
Chloride: 101 mmol/L (ref 98–111)
Creatinine, Ser: 0.7 mg/dL (ref 0.44–1.00)
GFR calc Af Amer: >60 mL/min (ref >60)
GFR calc non Af Amer: >60 mL/min (ref >60)
GLUCOSE: 104 mg/dL — AB (ref 70–99)
Potassium: 3 mmol/L — ABNORMAL LOW (ref 3.5–5.1)
Sodium: 136 mmol/L (ref 135–145)

## 2018-08-20 LAB — TROPONIN I: Troponin I: <0.03 ng/mL (ref <0.03)

## 2018-08-20 MED ORDER — AZITHROMYCIN 500 MG PO TABS
500.0000 mg | ORAL_TABLET | Freq: Once | ORAL | Status: AC
  Administered 2018-08-20: 500 mg via ORAL
  Filled 2018-08-20: qty 1

## 2018-08-20 MED ORDER — IPRATROPIUM-ALBUTEROL 0.5-2.5 (3) MG/3ML IN SOLN
3.0000 mL | Freq: Once | RESPIRATORY_TRACT | Status: AC
  Administered 2018-08-20: 3 mL via RESPIRATORY_TRACT
  Filled 2018-08-20: qty 3

## 2018-08-20 MED ORDER — LIDOCAINE-EPINEPHRINE (PF) 2 %-1:200000 IJ SOLN
10.0000 mL | Freq: Once | INTRAMUSCULAR | Status: AC
  Administered 2018-08-20: 10 mL
  Filled 2018-08-20 (×2): qty 10

## 2018-08-20 MED ORDER — IPRATROPIUM-ALBUTEROL 0.5-2.5 (3) MG/3ML IN SOLN
3.0000 mL | RESPIRATORY_TRACT | Status: DC
  Administered 2018-08-20 (×3): 3 mL via RESPIRATORY_TRACT
  Filled 2018-08-20 (×3): qty 3

## 2018-08-20 MED ORDER — ONDANSETRON HCL 4 MG/2ML IJ SOLN: 4.0000 mg | Freq: Four times a day (QID) | INTRAMUSCULAR | Status: DC | PRN

## 2018-08-20 MED ORDER — ATORVASTATIN CALCIUM 20 MG PO TABS
20.0000 mg | ORAL_TABLET | Freq: Every day | ORAL | Status: DC
  Administered 2018-08-20 – 2018-08-21 (×2): 20 mg via ORAL
  Filled 2018-08-20 (×2): qty 1

## 2018-08-20 MED ORDER — POTASSIUM CHLORIDE 20 MEQ/15ML (10%) PO SOLN
40.0000 meq | Freq: Once | ORAL | Status: AC
  Administered 2018-08-20: 40 meq via ORAL
  Filled 2018-08-20: qty 30

## 2018-08-20 MED ORDER — METHYLPREDNISOLONE SODIUM SUCC 125 MG IJ SOLR
60.0000 mg | INTRAMUSCULAR | Status: DC
  Administered 2018-08-21: 60 mg via INTRAVENOUS
  Filled 2018-08-20: qty 2

## 2018-08-20 MED ORDER — HYDROCODONE-ACETAMINOPHEN 5-325 MG PO TABS
1.0000 | ORAL_TABLET | ORAL | Status: DC | PRN
  Administered 2018-08-20 – 2018-08-21 (×4): 1 via ORAL
  Filled 2018-08-20 (×4): qty 1

## 2018-08-20 MED ORDER — ACETAMINOPHEN 325 MG PO TABS: 650.0000 mg | ORAL_TABLET | Freq: Four times a day (QID) | ORAL | Status: DC | PRN

## 2018-08-20 MED ORDER — CLINDAMYCIN HCL 150 MG PO CAPS
300.0000 mg | ORAL_CAPSULE | Freq: Once | ORAL | Status: AC
  Administered 2018-08-20: 300 mg via ORAL
  Filled 2018-08-20: qty 2

## 2018-08-20 MED ORDER — ENOXAPARIN SODIUM 40 MG/0.4ML ~~LOC~~ SOLN
40.0000 mg | SUBCUTANEOUS | Status: DC
  Administered 2018-08-20: 40 mg via SUBCUTANEOUS
  Filled 2018-08-20: qty 0.4

## 2018-08-20 MED ORDER — ALBUTEROL SULFATE (2.5 MG/3ML) 0.083% IN NEBU: 2.5000 mg | INHALATION_SOLUTION | RESPIRATORY_TRACT | Status: DC | PRN

## 2018-08-20 MED ORDER — IPRATROPIUM-ALBUTEROL 0.5-2.5 (3) MG/3ML IN SOLN
3.0000 mL | Freq: Three times a day (TID) | RESPIRATORY_TRACT | Status: DC
  Administered 2018-08-21 (×2): 3 mL via RESPIRATORY_TRACT
  Filled 2018-08-20 (×2): qty 3

## 2018-08-20 MED ORDER — HYDROCODONE-ACETAMINOPHEN 5-325 MG PO TABS
1.0000 | ORAL_TABLET | ORAL | Status: AC
  Administered 2018-08-20: 1 via ORAL
  Filled 2018-08-20: qty 1

## 2018-08-20 MED ORDER — METHYLPREDNISOLONE SODIUM SUCC 125 MG IJ SOLR
125.0000 mg | INTRAMUSCULAR | Status: AC
  Administered 2018-08-20: 125 mg via INTRAVENOUS
  Filled 2018-08-20: qty 2

## 2018-08-20 MED ORDER — AZITHROMYCIN 250 MG PO TABS
500.0000 mg | ORAL_TABLET | Freq: Every day | ORAL | Status: AC
  Administered 2018-08-21: 500 mg via ORAL
  Filled 2018-08-20: qty 2

## 2018-08-20 MED ORDER — SODIUM CHLORIDE 0.9 % IV SOLN
INTRAVENOUS | Status: DC
  Administered 2018-08-20 – 2018-08-21 (×3): via INTRAVENOUS

## 2018-08-20 MED ORDER — ONDANSETRON HCL 4 MG PO TABS: 4.0000 mg | ORAL_TABLET | Freq: Four times a day (QID) | ORAL | Status: DC | PRN

## 2018-08-20 MED ORDER — CITALOPRAM HYDROBROMIDE 20 MG PO TABS
20.0000 mg | ORAL_TABLET | Freq: Every day | ORAL | Status: DC
  Administered 2018-08-20 – 2018-08-21 (×2): 20 mg via ORAL
  Filled 2018-08-20 (×2): qty 1

## 2018-08-20 MED ORDER — SODIUM CHLORIDE 0.9 % IV BOLUS
500.0000 mL | Freq: Once | INTRAVENOUS | Status: AC
  Administered 2018-08-20: 500 mL via INTRAVENOUS

## 2018-08-20 MED ORDER — POTASSIUM CHLORIDE CRYS ER 20 MEQ PO TBCR
20.0000 meq | EXTENDED_RELEASE_TABLET | Freq: Every day | ORAL | Status: DC
  Administered 2018-08-21: 20 meq via ORAL
  Filled 2018-08-20: qty 1

## 2018-08-20 MED ORDER — BISACODYL 5 MG PO TBEC: 5.0000 mg | DELAYED_RELEASE_TABLET | Freq: Every day | ORAL | Status: DC | PRN

## 2018-08-20 MED ORDER — ENSURE ENLIVE PO LIQD
237.0000 mL | Freq: Two times a day (BID) | ORAL | Status: DC
  Administered 2018-08-20 – 2018-08-21 (×2): 237 mL via ORAL

## 2018-08-20 MED ORDER — ACETAMINOPHEN 650 MG RE SUPP: 650.0000 mg | Freq: Four times a day (QID) | RECTAL | Status: DC | PRN

## 2018-08-20 MED ORDER — AZITHROMYCIN 250 MG PO TABS: 250.0000 mg | ORAL_TABLET | Freq: Every day | ORAL | Status: DC

## 2018-08-20 NOTE — ED Provider Notes (Signed)
[facility] Emergency Department Provider Note   ____________________________________________   First MD Initiated Contact with Patient 08/20/18 [id number]     (approximate)  I have reviewed the triage vital signs and the nursing notes.   HISTORY  Chief Complaint Fall and Head Laceration    HPI Terri Burns is a 68 y.o. female with a history of COPD and a recent fall and March 12 along with a C2 odontoid fracture    Patient reports is been wearing a cervical collar the entirety of her visit and for the last few days, but today she got up early in the morning she got up from bed reports she stumbled fell onto the floor.  But cut she had over the middle of her forehead seems to have opened up again and has been bleeding.  It is also slightly swollen and sore across the front of her forehead.  She reports her neck continues to be painful but has not noticed any increased pain from prior, no numbness tingling or weakness in the arms or legs.  She remains in her collar at all times.  Denies any other injury, reports she has a slight amount of bruising of her left foot when she dropped something on her foot about 2 weeks ago.  Also has noticed a little bit of redness around the right ankle where she had a small abrasion that is come up over the last couple days.  No fevers or chills.  No nausea vomiting.  Reports not really a headache but more soreness over the scalp.  No chest pain or shortness of breath, reports in the morning she uses an inhaler treatment but had not had it today because of the fall   Past Medical History:  Diagnosis Date  . COPD (chronic obstructive pulmonary disease) ([place])   . Depression   . GERD (gastroesophageal reflux disease)   . Hyperlipidemia   . Hypertension     Patient Active Problem List   Diagnosis Date Noted  . Skin lesion of face 05/01/2018  . Atherosclerosis of aorta ([place]) 03/04/2018  . Coronary artery calcification of native  artery 03/04/2018  . 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 Chronic pain 01/01/2018  . Benign essential HTN 01/01/2018  . Senile purpura ([place]) 01/01/2018  . Recurrent major depressive disorder, in full remission ([place]) 01/01/2018  . Hyperglycemia 09/27/2016  . Tobacco abuse 08/20/2015  . Allergic rhinitis, seasonal 05/12/2015  . Emphysema lung ([place]) 05/12/2015  . Degeneration of intervertebral disc of cervical region 05/12/2015  . GERD (gastroesophageal reflux disease) 05/12/2015  . Major depression in remission ([employer]) 05/12/2015    Past Surgical History:  Procedure Laterality Date  . NECK SURGERY N/A 10/2003    Prior to Admission medications   Medication Sig Start Date End Date Taking? Authorizing Provider  albuterol (VENTOLIN HFA) 108 (90 Base) MCG/ACT inhaler Inhale 2 puffs into the lungs every 6 (six) hours as needed for wheezing or shortness of breath. 11/22/17  Yes Poulose, Bethel Born, NP  aspirin EC 81 MG tablet Take 1 tablet (81 mg total) by mouth daily. 05/01/18  Yes Sowles, Drue Stager, MD  atorvastatin (LIPITOR) 20 MG tablet Take 1 tablet (20 mg total) by mouth daily. 05/01/18  Yes Sowles, Drue Stager, MD  celecoxib (CELEBREX) 200 MG capsule Take 1 capsule by mouth as needed.   Yes Kalman, Lennox Solders, DO  citalopram (CELEXA) 20 MG tablet Take 1 tablet (20 mg total) by mouth daily. 05/01/18  Yes  Steele Sizer, MD  fluticasone [facility]) 50 MCG/ACT nasal spray SPRAY 2 SPRAYS INTO EACH NOSTRIL EVERY DAY 04/01/18  Yes Sowles, Drue Stager, MD  hydrochlorothiazide (MICROZIDE) 12.5 MG capsule TAKE 1 CAPSULE BY MOUTH EVERY DAY 07/25/18  Yes Sowles, Drue Stager, MD  ipratropium-albuterol (DUONEB) 0.5-2.5 (3) MG/3ML SOLN TAKE 3 MLS BY NEBULIZATION EVERY 6 (SIX) HOURS AS NEEDED. 06/08/17  Yes Roselee Nova, MD  loratadine (CLARITIN) 10 MG tablet Take 1 tablet (10 mg total) by mouth daily. 05/01/18  Yes Sowles, Drue Stager, MD  losartan (COZAAR) 100 MG tablet TAKE 1 TABLET BY MOUTH EVERY DAY  07/25/18  Yes Sowles, Drue Stager, MD  metoprolol succinate (TOPROL-XL) 25 MG 24 hr tablet TAKE 1 TABLET (25 MG TOTAL) BY MOUTH DAILY. 05/01/18  Yes Sowles, Drue Stager, MD  pantoprazole (PROTONIX) 40 MG tablet Take 1 tablet (40 mg total) by mouth daily. 05/01/18  Yes Sowles, Drue Stager, MD  tiZANidine (ZANAFLEX) 2 MG tablet Take 1 tablet (2 mg total) by mouth every evening. 11/22/17  Yes Poulose, Bethel Born, NP    Allergies Levofloxacin; Penicillins; and Meloxicam  Family History  Problem Relation Age of Onset  . Hypertension Mother     Social History Social History   Tobacco Use  . Smoking status: Current Every Day Smoker    Packs/day: 1.00    Years: 45.00    Pack years: 45.00    Types: Cigarettes  . Smokeless tobacco: Never Used  Substance Use Topics  . Alcohol use: No    Alcohol/week: 0.0 standard drinks  . Drug use: No    Review of Systems Constitutional: No fever/chills denies any new weakness or new trouble walking.  She is able to get up after the fall and walk. Eyes: No visual changes. ENT: No sore throat.  No neck pain in the back of her mid to upper neck that is unchanged over the last 4 days. Cardiovascular: Denies chest pain. Respiratory: Denies shortness of breath.  Reports she normally uses inhaler each morning has not had a treatment today.  Would like to have her nebulizer treatment but denies any new or ongoing symptoms. Gastrointestinal: No abdominal pain.   Genitourinary: Negative for dysuria. Musculoskeletal: Negative for back pain. Skin: Negative for rash except for some redness around her right ankle where she had a small abrasion. Neurological: Negative for headaches, areas of focal weakness or numbness.    ____________________________________________   PHYSICAL EXAM:  VITAL SIGNS: ED Triage Vitals  Enc Vitals Group     BP 08/20/18 0628 120/69     Pulse Rate 08/20/18 0628 78     Resp 08/20/18 0628 20     Temp 08/20/18 0628 98.2 F (36.8 C)     Temp  Source 08/20/18 0628 Oral     SpO2 08/20/18 0628 96 %     Weight 08/20/18 0629 135 lb (61.2 kg)     Height 08/20/18 0629 5\' 1"  (1.549 m)     Head Circumference --      Peak Flow --      Pain Score 08/20/18 0634 7     Pain Loc --      Pain Edu? --      Excl. in [place]? --     Constitutional: Alert and oriented. Well appearing and in no acute distress.  She is very pleasant.  She and her family are at the bedside very pleasant. Eyes: Conjunctivae are normal. Head: Atraumatic except for a small hematoma overlying an area where she has 3 prior  lacerations, the 2 smaller lacerations are well approximated clean dry and intact however the larger more midline forehead laceration has come open, the galea is visible and there is a gap of about 1 inch between skin edges.  The tissue on the lateral area is quite friable.  I cleaned the area with iodine, saline, and removed 4 of the sutures that it pulled through but after careful examination I am not able to find a way to approximate this tissue due to the granular nature thinness in the weakness of the tissue I am unable to find a reasonable way to cross this. Nose: No congestion/rhinnorhea. Mouth/Throat: Mucous membranes are moist. Neck: No stridor.  Cardiovascular: Normal rate, regular rhythm. Grossly normal heart sounds.  Good peripheral circulation. Respiratory: Some just slight tachypnea, very minimal.  She does have rhonchorous lung sounds centrally with some slight end expiratory wheezing.  Speaks in clear sentences Gastrointestinal: Soft and nontender. No distention. Musculoskeletal: No lower extremity tenderness some trace ankle edema bilateral, also some slight erythema and warmth about the size of the palm of the anterior right ankle there is some old abrasions that are healing Neurologic:  Normal speech and language. No gross focal neurologic deficits are appreciated.  Skin:  Skin is warm, dry and intact. No rash noted. Psychiatric: Mood and  affect are normal. Speech and behavior are normal.  ____________________________________________   LABS (all labs ordered are listed, but only abnormal results are displayed)  Labs Reviewed  CBC - Abnormal; Notable for the following components:      Result Value   RBC 3.34 (*)    Hemoglobin 9.0 (*)    HCT 28.0 (*)    RDW 17.4 (*)    All other components within normal limits  BASIC METABOLIC PANEL - Abnormal; Notable for the following components:   Potassium 3.0 (*)    Glucose, Bld 104 (*)    Calcium 8.2 (*)    All other components within normal limits  INFLUENZA PANEL BY PCR (TYPE A & B)  TROPONIN I   ____________________________________________  EKG  Reviewed interpreted at 830 Heart rate 75 QRS 110 QTc 460 Normal sinus rhythm, inferior Q waves.  Have added a troponin at this time though she denies chest pain  No ST elevation MI ____________________________________________  RADIOLOGY  Dg Chest 2 View  Result Date: 08/20/2018 CLINICAL DATA:  COPD and cough EXAM: CHEST - 2 VIEW COMPARISON:  03/01/2018 FINDINGS: Cardiac shadows within normal limits. Aortic calcifications are noted. Some mild scarring is again noted in the bases similar to that seen on prior CT examination. No acute infiltrate or sizable effusion is noted. Superior segment left lower lobe scarring is again noted as well. No new focal abnormality is seen. IMPRESSION: Changes of scarring bilaterally.  No acute abnormality noted. Electronically Signed   By: Inez Catalina M.D.   On: 08/20/2018 08:22   Ct Head Wo Contrast  Result Date: 08/20/2018 CLINICAL DATA:  Recent fall and increased neck pain, history of odontoid fracture, initial encounter EXAM: CT HEAD WITHOUT CONTRAST CT CERVICAL SPINE WITHOUT CONTRAST TECHNIQUE: Multidetector CT imaging of the head and cervical spine was performed following the standard protocol without intravenous contrast. Multiplanar CT image reconstructions of the cervical spine were  also generated. COMPARISON:  08/16/2018 FINDINGS: CT HEAD FINDINGS Brain: Atrophic changes are again identified and stable. No findings to suggest acute hemorrhage, acute infarction or space-occupying mass lesion are noted. Vascular: No hyperdense vessel or unexpected calcification. Skull: Normal. Negative for  fracture or focal lesion. Sinuses/Orbits: Within normal limits. Other: Persistent right forehead laceration is noted. CT CERVICAL SPINE FINDINGS Alignment: Stable scoliosis concave to the left is noted. Skull base and vertebrae: There is again noted a fracture through the base of the odontoid without significant displacement when compared with the prior exam. Prior surgical fusion at C5-6 is noted. Changes of prior fusion at C6-7 are again seen and stable. Mild facet hypertrophic changes are noted particularly on the left. No new fracture is noted. Posterior fusion defect at C1 is noted. Soft tissues and spinal canal: Surrounding soft tissues demonstrate a hypodense lesion within the right lobe of the thyroid stable from the prior exam. Upper chest: Mild emphysematous changes are noted. Other: None IMPRESSION: CT of the head: Persistent right forehead laceration stable from the previous exam. No acute intracranial abnormality is seen. CT of cervical spine: Stable C2 fracture at the base of the odontoid without significant increased displacement. No new fractures are seen. Stable right thyroid lesion. Electronically Signed   By: Inez Catalina M.D.   On: 08/20/2018 07:17   Ct Cervical Spine Wo Contrast  Result Date: 08/20/2018 CLINICAL DATA:  Recent fall and increased neck pain, history of odontoid fracture, initial encounter EXAM: CT HEAD WITHOUT CONTRAST CT CERVICAL SPINE WITHOUT CONTRAST TECHNIQUE: Multidetector CT imaging of the head and cervical spine was performed following the standard protocol without intravenous contrast. Multiplanar CT image reconstructions of the cervical spine were also generated.  COMPARISON:  08/16/2018 FINDINGS: CT HEAD FINDINGS Brain: Atrophic changes are again identified and stable. No findings to suggest acute hemorrhage, acute infarction or space-occupying mass lesion are noted. Vascular: No hyperdense vessel or unexpected calcification. Skull: Normal. Negative for fracture or focal lesion. Sinuses/Orbits: Within normal limits. Other: Persistent right forehead laceration is noted. CT CERVICAL SPINE FINDINGS Alignment: Stable scoliosis concave to the left is noted. Skull base and vertebrae: There is again noted a fracture through the base of the odontoid without significant displacement when compared with the prior exam. Prior surgical fusion at C5-6 is noted. Changes of prior fusion at C6-7 are again seen and stable. Mild facet hypertrophic changes are noted particularly on the left. No new fracture is noted. Posterior fusion defect at C1 is noted. Soft tissues and spinal canal: Surrounding soft tissues demonstrate a hypodense lesion within the right lobe of the thyroid stable from the prior exam. Upper chest: Mild emphysematous changes are noted. Other: None IMPRESSION: CT of the head: Persistent right forehead laceration stable from the previous exam. No acute intracranial abnormality is seen. CT of cervical spine: Stable C2 fracture at the base of the odontoid without significant increased displacement. No new fractures are seen. Stable right thyroid lesion. Electronically Signed   By: Inez Catalina M.D.   On: 08/20/2018 07:17    ____________________________________________   PROCEDURES  Procedure(s) performed: None  Procedures  Critical Care performed: No  ____________________________________________   INITIAL IMPRESSION / ASSESSMENT AND PLAN / ED COURSE  Pertinent labs & imaging results that were available during my care of the patient were reviewed by me and considered in my medical decision making (see chart for details).     Clinical Course as of Aug 20 943  Mon Aug 20, 2018  0758 Discussed case with Dr. Lacinda Axon, advises that plastic surgery or facial surgery may be required due to her re-open wound.  I am in agreement.  Discussed with the patient she would prefer transfer to [facility] for further evaluation.  Dr.  Lacinda Axon also advised that further evaluation for refitting or better fitting cervical collar could be provided.  I also discussed this with the family.  I offered, family would like to attempt transfer to [facility] to see plastic surgery.  I am in agreement.  Transfer request placed to [facility] at this time ([place])   [MQ]  [phone] The patient was seen by Dr. Clarisa Schools in the emergency department, he evaluated   [MQ]  0930 Unknown on pulse oximetry patient's oxygen saturation 70% on room air, evaluated her and changed her probe to her left hand which also read 68 to 70% on room air.  Placed her on 3 L of oxygen by nasal cannula and her oxygen saturation improved to 99%.  She reports she does not really feel too short of breath but has been feeling a little short of breath.  She does have slightly rhonchorous lung sounds, states scant wheezing.  Much better on oxygen, will give additional nebs, Solu-Medrol, discussed with the patient and family at this point with recommend hospitalization as I suspect concomitant hypoxia or COPD-like symptoms of exacerbation or bronchitis are contributing to her episodes of recent falling.  Patient and family are agreeable with this plan.   [MQ]    Clinical Course User Index [MQ] Delman Kitten, MD   Patient presents for recurrent falls.  Noted to have episode of hypoxia here in the ED while fully awake and alert but reports just a slight feeling of shortness of breath.  Question if the patient may have some significant or severe underlying lung disease including COPD.  Will double check influenza, though at this point I suspect likely COPD type exacerbation.  Also associated cellulitis, cervical fracture being  treated with c-collar for which I have placed consultation to biomed for better fitting device and discussed with Dr. Lacinda Axon.  Head laceration healing by secondary intention.  Hypokalemia being corrected.  Discussed case with Dr. Manuella Ghazi hospitalist  ____________________________________________   FINAL CLINICAL IMPRESSION(S) / ED DIAGNOSES  Final diagnoses:  Hypokalemia  COPD with acute exacerbation ([place])  Forehead laceration, sequela        Note:  This document was prepared using Dragon voice recognition software and may include unintentional dictation errors       Delman Kitten, MD 08/20/18 0945

## 2018-08-20 NOTE — ED Notes (Signed)
Physical Therapy at bedside.

## 2018-08-20 NOTE — ED Notes (Signed)
This RN attempted to give report-informed that floor is trying to change bed assignment. Floor states they will call back with info.

## 2018-08-20 NOTE — ED Notes (Signed)
At this time, pt has currently almost finished with the first half of her sandwich and has drank approx 50% of her ensure. Respirations easy and even. Pt encouraged to continue to eat and please hit call bell when she is through eating to receive her breathing treatment.

## 2018-08-20 NOTE — ED Notes (Signed)
Waiting for medication verification from pharmacy

## 2018-08-20 NOTE — ED Notes (Signed)
Small abrasion was found under patient's c-collar.  This RN placed a small colloid dressing over area to prevent further rubbing.

## 2018-08-20 NOTE — Discharge Instructions (Addendum)
Call [facility] and request [facility] Therapy services. Also, we recommend follow-up with    Near-Syncope Near-syncope is when you suddenly get weak or dizzy, or you feel like you might pass out (faint). This is due to a lack of blood flow to the brain. During an episode of near-syncope, you may:  Feel dizzy or light-headed.  Feel sick to your stomach (nauseous).  See all white or all black.  Have cold, clammy skin. This condition is caused by a sudden decrease in blood flow to the brain. This decrease can result from various causes, but most of those causes are not dangerous. However, near-syncope may be a sign of a serious medical problem, so it is important to seek medical care. Follow these instructions at home: Pay attention to any changes in your symptoms. Take these actions to help with your condition:  Have someone stay with you until you feel stable.  Talk with your doctor about your symptoms. You may need to have testing to understand the cause of your near-syncope.  Do not drive, use machinery, or play sports until your doctor says it is okay.  Keep all follow-up visits as told by your doctor. This is important.  If you start to feel like you might pass out, lie down right away and raise (elevate) your feet above the level of your heart. Breathe deeply and steadily. Wait until all of the symptoms are gone.  Drink enough fluid to keep your pee (urine) pale yellow. Medicines  If you are taking blood pressure or heart medicine, get up slowly and spend many minutes getting ready to sit and then stand. This can help with dizziness.  Take over-the-counter and prescription medicines only as told by your doctor. Get help right away if you:  Have a seizure.  Have pain in your: ? Chest. ? Tummy, ? Back.  Faint.  Have a bad headache.  Are bleeding from your mouth or butt.  Have black or tarry poop (stool).  Have a very fast or uneven heartbeat  (palpitations).  Are confused.  Have trouble walking.  Are very weak.  Have trouble seeing. These symptoms may represent a serious problem that is an emergency. Do not wait to see if your symptoms will go away. Get medical help right away. Call your local emergency services (911 in the [place] Do not drive yourself to the hospital. Summary  Near-syncope is when you suddenly get weak or dizzy, or you feel like you might pass out.  This condition is caused by a lack of blood flow to the brain.  Near-syncope may be a sign of a serious medical problem, so it is important to seek medical care. This information is not intended to replace advice given to you by your health care provider. Make sure you discuss any questions you have with your health care provider. Document Released: 11/09/2007 Document Revised: 01/16/2018 Document Reviewed: 02/04/2015 Elsevier Interactive Patient Education  2019 [employer].

## 2018-08-20 NOTE — ED Notes (Signed)
[employer] in room to fit collar for patient.

## 2018-08-20 NOTE — ED Notes (Signed)
PT encouraged to eat off of dinner tray

## 2018-08-20 NOTE — Evaluation (Signed)
Physical Therapy Evaluation Patient Details Name: KALYNN DECLERCQ MRN: [id number] DOB: 1950-06-25 Today's Date: 08/20/2018   History of Present Illness  Patient is a 68 year old female who has had 2 falls in the last week. Recent odontoid fracture, now C2 fracture and forehead wound. Patient admitted with COPD exacerbation. PMH to include: COPD, depression, HTN, HLD.   Clinical Impression  Patient received in bed, sleepy. Family present. Agrees to PT evaluation. Reports pain in C spine, cervical collar donned. Patient's family reports she dropped a 2 liter bottle of soda on her right foot recently and has been wearing a boot on it. Boot is not present in the ED. Patient performed bed mobility with min assist to bring LEs on and off the bed. Required min guard for sit to stand transfers. Patient stood at edge of bed with rolling walker and min guard assist x 2 min. Reports right foot pain with WB. Patient returned to bed. Patient has good standing balance with RW support. Patient will benefit from continued skilled PT to address her weakness, falls and difficulty walking for maximal independence.       Follow Up Recommendations Home health PT    Equipment Recommendations  None recommended by PT    Recommendations for Other Services       Precautions / Restrictions Precautions Precautions: Fall;Cervical Precaution Comments: patient has sore right foot, was wearing boot at home.  Required Braces or Orthoses: Cervical Brace Cervical Brace: Hard collar;At all times Restrictions Weight Bearing Restrictions: No      Mobility  Bed Mobility Overal bed mobility: Needs Assistance Bed Mobility: Supine to Sit;Sit to Supine     Supine to sit: Min assist Sit to supine: Min assist   General bed mobility comments: assist to bring LEs on and off bed  Transfers Overall transfer level: Needs assistance Equipment used: Rolling walker (2 wheeled) Transfers: Sit to/from Stand Sit to Stand: Min  guard         General transfer comment: sore right foot with standing  Ambulation/Gait             General Gait Details: deferred this visit as patient reports right foot pain with WB and does not have boot here.   Stairs            Wheelchair Mobility    Modified Rankin (Stroke Patients Only)       Balance Overall balance assessment: Modified Independent                                           Pertinent Vitals/Pain Pain Assessment: Faces Faces Pain Scale: Hurts little more Pain Location: right foot, neck Pain Descriptors / Indicators: Aching;Sore Pain Intervention(s): Limited activity within patient's tolerance;Monitored during session;Patient requesting pain meds-RN notified    Home Living Family/patient expects to be discharged to:: Private residence Living Arrangements: Alone Available Help at Discharge: Family Type of Home: Apartment Home Access: Level entry     Home Layout: One level Home Equipment: [place] - 4 wheels;Grab bars - tub/shower      Prior Function Level of Independence: Independent         Comments: Independent however two falls, does not use walker at home     Hand Dominance        Extremity/Trunk Assessment   Upper Extremity Assessment Upper Extremity Assessment: Generalized weakness    Lower  Extremity Assessment Lower Extremity Assessment: Generalized weakness       Communication   Communication: No difficulties  Cognition Arousal/Alertness: Awake/alert Behavior During Therapy: WFL for tasks assessed/performed Overall Cognitive Status: Within Functional Limits for tasks assessed                                        General Comments      Exercises     Assessment/Plan    PT Assessment Patient needs continued PT services  PT Problem List Decreased strength;Decreased balance;Decreased mobility;Decreased activity tolerance;Decreased coordination;Decreased safety  awareness;Decreased knowledge of use of DME;Cardiopulmonary status limiting activity;Decreased knowledge of precautions;Pain       PT Treatment Interventions DME instruction;Functional mobility training;Balance training;Patient/family education;Gait training;Therapeutic activities;Neuromuscular re-education;Stair training;Therapeutic exercise    PT Goals (Current goals can be found in the Care Plan section)  Acute Rehab PT Goals Patient Stated Goal: to go home with family at discharge  PT Goal Formulation: With patient/family Time For Goal Achievement: 08/27/18 Potential to Achieve Goals: Good    Frequency Min 2X/week   Barriers to discharge Decreased caregiver support      Co-evaluation               AM-PAC PT "6 Clicks" Mobility  Outcome Measure Help needed turning from your back to your side while in a flat bed without using bedrails?: A Little Help needed moving from lying on your back to sitting on the side of a flat bed without using bedrails?: A Little Help needed moving to and from a bed to a chair (including a wheelchair)?: A Little Help needed standing up from a chair using your arms (e.g., wheelchair or bedside chair)?: A Little Help needed to walk in hospital room?: A Lot Help needed climbing 3-5 steps with a railing? : A Lot 6 Click Score: 16    End of Session Equipment Utilized During Treatment: Oxygen;Cervical collar Activity Tolerance: Patient limited by pain Patient left: in bed;with family/visitor present;with call bell/phone within reach Nurse Communication: Mobility status PT Visit Diagnosis: Unsteadiness on feet (R26.81);Muscle weakness (generalized) (M62.81);Difficulty in walking, not elsewhere classified (R26.2);Repeated falls (R29.6)    Time: 0233-4356 PT Time Calculation (min) (ACUTE ONLY): 15 min   Charges:   PT Evaluation $PT Eval Moderate Complexity: 1 Mod          Avedis Bevis, PT, GCS 08/20/18,3:31 PM

## 2018-08-20 NOTE — ED Notes (Signed)
Report to anna, rn

## 2018-08-20 NOTE — ED Notes (Signed)
Dr. Vianne Bulls made aware of pt's blood pressures. Verbal order for 1 Liter normal saline bolus.

## 2018-08-20 NOTE — ED Notes (Signed)
Patient transported to CT 

## 2018-08-20 NOTE — ED Notes (Signed)
ED TO INPATIENT HANDOFF REPORT  ED Nurse Name and Phone #: Neliah Cuyler [phone]  S Name/Age/Gender [name] 68 y.o. female Room/Bed: ED24A/ED24A  Code Status   Code Status: Full Code  Home/SNF/Other Home Patient oriented to: self, place, time and situation Is this baseline? Yes   Triage Complete: Triage complete  Chief Complaint Fall  Triage Note Pt was seen here Thursday after a fall and had laceration repaired to forehead; pt says this am she was restless and tried to get off her bed at the end; fell, hitting her forehead on the floor and has reopened the stitches that were placed; pt also suffered an odontoid fracture and arrives wearing her c-collar as directed; pt says most of her pain is in her neck and the pain is a little worse since she fell   Allergies Allergies  Allergen Reactions  . Levofloxacin Shortness Of Breath  . Penicillins Hives    itching  . Meloxicam     dizziness    Level of Care/Admitting Diagnosis ED Disposition    ED Disposition Condition [facility] Area: [place] [[id number]]  Level of Care: Med-Surg [16]  Diagnosis: Acute respiratory failure with hypoxemia [facility]) [[id number]]  Admitting Physician: Epifanio Lesches [[id number]]  Attending Physician: Epifanio Lesches [phone]  Estimated length of stay: past midnight tomorrow  Certification:: I certify this patient will need inpatient services for at least 2 midnights  PT Class (Do Not Modify): Inpatient [101]  PT Acc Code (Do Not Modify): Private [1]       B Medical/Surgery History Past Medical History:  Diagnosis Date  . COPD (chronic obstructive pulmonary disease) ([place])   . Depression   . GERD (gastroesophageal reflux disease)   . Hyperlipidemia   . Hypertension    Past Surgical History:  Procedure Laterality Date  . NECK SURGERY N/A 10/2003     A IV Location/Drains/Wounds Patient Lines/Drains/Airways Status   Active Line/Drains/Airways    Name:   Placement date:   Placement time:   Site:   Days:   Peripheral IV 08/20/18 Right Forearm   08/20/18    0816    Forearm   less than 1          Intake/Output Last 24 hours  Intake/Output Summary (Last 24 hours) at 08/20/2018 1147 Last data filed at 08/20/2018 1135 Gross per 24 hour  Intake 500 ml  Output -  Net 500 ml    Labs/Imaging Results for orders placed or performed during the hospital encounter of 08/20/18 (from the past 48 hour(s))  CBC     Status: Abnormal   Collection Time: 08/20/18  8:19 AM  Result Value Ref Range   WBC 10.0 4.0 - 10.5 K/uL   RBC 3.34 (L) 3.87 - 5.11 MIL/uL   Hemoglobin 9.0 (L) 12.0 - 15.0 g/dL   HCT 28.0 (L) 36.0 - 46.0 %   MCV 83.8 80.0 - 100.0 fL   MCH 26.9 26.0 - 34.0 pg   MCHC 32.1 30.0 - 36.0 g/dL   RDW 17.4 (H) 11.5 - 15.5 %   Platelets 320 150 - 400 K/uL   nRBC 0.0 0.0 - 0.2 %    Comment: Performed at [facility], [address] [place], [place] [address]  Basic metabolic panel     Status: Abnormal   Collection Time: 08/20/18  8:19 AM  Result Value Ref Range   Sodium 136 135 - 145 mmol/L   Potassium 3.0 (L) 3.5 -  5.1 mmol/L   Chloride 101 98 - 111 mmol/L   CO2 22 22 - 32 mmol/L   Glucose, Bld 104 (H) 70 - 99 mg/dL   BUN 13 8 - 23 mg/dL   Creatinine, Ser 0.70 0.44 - 1.00 mg/dL   Calcium 8.2 (L) 8.9 - 10.3 mg/dL   GFR calc non Af Amer >60 >60 mL/min   GFR calc Af Amer >60 >60 mL/min   Anion gap 13 5 - 15    Comment: Performed at [facility], [address] [place], [place] [address]  Troponin I - Add-On to previous collection     Status: None   Collection Time: 08/20/18  8:19 AM  Result Value Ref Range   Troponin I <0.03 <0.03 ng/mL    Comment: Performed at [facility], [address], [place], [place] [address]  Influenza panel by PCR (type A & B)     Status: None   Collection Time: 08/20/18  9:58 AM  Result Value Ref Range   Influenza A By PCR NEGATIVE NEGATIVE   Influenza B By PCR  NEGATIVE NEGATIVE    Comment: (NOTE) The Xpert Xpress Flu assay is intended as an aid in the diagnosis of  influenza and should not be used as a sole basis for treatment.  This  assay is FDA approved for nasopharyngeal swab specimens only. Nasal  washings and aspirates are unacceptable for Xpert Xpress Flu testing. Performed at [facility], [address] [place], [place] [address]    Dg Chest 2 View  Result Date: 08/20/2018 CLINICAL DATA:  COPD and cough EXAM: CHEST - 2 VIEW COMPARISON:  03/01/2018 FINDINGS: Cardiac shadows within normal limits. Aortic calcifications are noted. Some mild scarring is again noted in the bases similar to that seen on prior CT examination. No acute infiltrate or sizable effusion is noted. Superior segment left lower lobe scarring is again noted as well. No new focal abnormality is seen. IMPRESSION: Changes of scarring bilaterally.  No acute abnormality noted. Electronically Signed   By: Inez Catalina M.D.   On: 08/20/2018 08:22   Ct Head Wo Contrast  Result Date: 08/20/2018 CLINICAL DATA:  Recent fall and increased neck pain, history of odontoid fracture, initial encounter EXAM: CT HEAD WITHOUT CONTRAST CT CERVICAL SPINE WITHOUT CONTRAST TECHNIQUE: Multidetector CT imaging of the head and cervical spine was performed following the standard protocol without intravenous contrast. Multiplanar CT image reconstructions of the cervical spine were also generated. COMPARISON:  08/16/2018 FINDINGS: CT HEAD FINDINGS Brain: Atrophic changes are again identified and stable. No findings to suggest acute hemorrhage, acute infarction or space-occupying mass lesion are noted. Vascular: No hyperdense vessel or unexpected calcification. Skull: Normal. Negative for fracture or focal lesion. Sinuses/Orbits: Within normal limits. Other: Persistent right forehead laceration is noted. CT CERVICAL SPINE FINDINGS Alignment: Stable scoliosis concave to the left is noted. Skull base  and vertebrae: There is again noted a fracture through the base of the odontoid without significant displacement when compared with the prior exam. Prior surgical fusion at C5-6 is noted. Changes of prior fusion at C6-7 are again seen and stable. Mild facet hypertrophic changes are noted particularly on the left. No new fracture is noted. Posterior fusion defect at C1 is noted. Soft tissues and spinal canal: Surrounding soft tissues demonstrate a hypodense lesion within the right lobe of the thyroid stable from the prior exam. Upper chest: Mild emphysematous changes are noted. Other: None IMPRESSION: CT of the head: Persistent right  forehead laceration stable from the previous exam. No acute intracranial abnormality is seen. CT of cervical spine: Stable C2 fracture at the base of the odontoid without significant increased displacement. No new fractures are seen. Stable right thyroid lesion. Electronically Signed   By: Inez Catalina M.D.   On: 08/20/2018 07:17   Ct Cervical Spine Wo Contrast  Result Date: 08/20/2018 CLINICAL DATA:  Recent fall and increased neck pain, history of odontoid fracture, initial encounter EXAM: CT HEAD WITHOUT CONTRAST CT CERVICAL SPINE WITHOUT CONTRAST TECHNIQUE: Multidetector CT imaging of the head and cervical spine was performed following the standard protocol without intravenous contrast. Multiplanar CT image reconstructions of the cervical spine were also generated. COMPARISON:  08/16/2018 FINDINGS: CT HEAD FINDINGS Brain: Atrophic changes are again identified and stable. No findings to suggest acute hemorrhage, acute infarction or space-occupying mass lesion are noted. Vascular: No hyperdense vessel or unexpected calcification. Skull: Normal. Negative for fracture or focal lesion. Sinuses/Orbits: Within normal limits. Other: Persistent right forehead laceration is noted. CT CERVICAL SPINE FINDINGS Alignment: Stable scoliosis concave to the left is noted. Skull base and vertebrae:  There is again noted a fracture through the base of the odontoid without significant displacement when compared with the prior exam. Prior surgical fusion at C5-6 is noted. Changes of prior fusion at C6-7 are again seen and stable. Mild facet hypertrophic changes are noted particularly on the left. No new fracture is noted. Posterior fusion defect at C1 is noted. Soft tissues and spinal canal: Surrounding soft tissues demonstrate a hypodense lesion within the right lobe of the thyroid stable from the prior exam. Upper chest: Mild emphysematous changes are noted. Other: None IMPRESSION: CT of the head: Persistent right forehead laceration stable from the previous exam. No acute intracranial abnormality is seen. CT of cervical spine: Stable C2 fracture at the base of the odontoid without significant increased displacement. No new fractures are seen. Stable right thyroid lesion. Electronically Signed   By: Inez Catalina M.D.   On: 08/20/2018 07:17    Pending Labs Unresulted Labs (From admission, onward)    Start     Ordered   08/27/18 0500  Creatinine, serum  (enoxaparin (LOVENOX)    CrCl >/= 30 ml/min)  Weekly,   STAT    Comments:  while on enoxaparin therapy    08/20/18 1135   08/21/18 7510  Basic metabolic panel  Tomorrow morning,   STAT     08/20/18 1135   08/21/18 0500  CBC  Tomorrow morning,   STAT     08/20/18 1135   08/20/18 1132  CBC  (enoxaparin (LOVENOX)    CrCl >/= 30 ml/min)  Once,   STAT    Comments:  Baseline for enoxaparin therapy IF NOT ALREADY DRAWN.  Notify MD if PLT < 100 K.    08/20/18 1135   08/20/18 1132  Creatinine, serum  (enoxaparin (LOVENOX)    CrCl >/= 30 ml/min)  Once,   STAT    Comments:  Baseline for enoxaparin therapy IF NOT ALREADY DRAWN.    08/20/18 1135          Vitals/Pain Today's Vitals   08/20/18 1002 08/20/18 1030 08/20/18 1130 08/20/18 1134  BP: (!) 84/59 (!) 87/57 (!) 87/54   Pulse: 78 79 81   Resp: 13 14 13    Temp:      TempSrc:      SpO2: 100%  99% 97% 98%  Weight:      Height:  PainSc:        Isolation Precautions Droplet precaution  Medications Medications  atorvastatin (LIPITOR) tablet 20 mg (has no administration in time range)  citalopram (CELEXA) tablet 20 mg (has no administration in time range)  0.9 %  sodium chloride infusion (has no administration in time range)  acetaminophen (TYLENOL) tablet 650 mg (has no administration in time range)    Or  acetaminophen (TYLENOL) suppository 650 mg (has no administration in time range)  HYDROcodone-acetaminophen (NORCO/VICODIN) 5-325 MG per tablet 1-2 tablet (has no administration in time range)  bisacodyl (DULCOLAX) EC tablet 5 mg (has no administration in time range)  ondansetron (ZOFRAN) tablet 4 mg (has no administration in time range)    Or  ondansetron (ZOFRAN) injection 4 mg (has no administration in time range)  enoxaparin (LOVENOX) injection 40 mg (has no administration in time range)  feeding supplement (ENSURE ENLIVE) (ENSURE ENLIVE) liquid 237 mL (has no administration in time range)  methylPREDNISolone sodium succinate (SOLU-MEDROL) 125 mg/2 mL injection 60 mg (has no administration in time range)  ipratropium-albuterol (DUONEB) 0.5-2.5 (3) MG/3ML nebulizer solution 3 mL (has no administration in time range)  potassium chloride SA (K-DUR,KLOR-CON) CR tablet 20 mEq (has no administration in time range)  azithromycin (ZITHROMAX) tablet 500 mg (has no administration in time range)    Followed by  azithromycin (ZITHROMAX) tablet 250 mg (has no administration in time range)  ipratropium-albuterol (DUONEB) 0.5-2.5 (3) MG/3ML nebulizer solution 3 mL (3 mLs Nebulization Given 08/20/18 0825)  clindamycin (CLEOCIN) capsule 300 mg (300 mg Oral Given 08/20/18 0809)  lidocaine-EPINEPHrine (XYLOCAINE W/EPI) 2 %-1:200000 (PF) injection 10 mL (10 mLs Infiltration Given by Other 08/20/18 0800)  HYDROcodone-acetaminophen (NORCO/VICODIN) 5-325 MG per tablet 1 tablet (1 tablet Oral  Given 08/20/18 0809)  potassium chloride 20 MEQ/15ML (10%) solution 40 mEq (40 mEq Oral Given 08/20/18 1131)  methylPREDNISolone sodium succinate (SOLU-MEDROL) 125 mg/2 mL injection 125 mg (125 mg Intravenous Given 08/20/18 0955)  ipratropium-albuterol (DUONEB) 0.5-2.5 (3) MG/3ML nebulizer solution 3 mL (3 mLs Nebulization Given 08/20/18 0956)  ipratropium-albuterol (DUONEB) 0.5-2.5 (3) MG/3ML nebulizer solution 3 mL (3 mLs Nebulization Given 08/20/18 0956)  azithromycin (ZITHROMAX) tablet 500 mg (500 mg Oral Given 08/20/18 0954)  sodium chloride 0.9 % bolus 500 mL (0 mLs Intravenous Stopped 08/20/18 1135)    Mobility walks with person assist Low fall risk   Focused Assessments Respiratory: unlabored, occaisonal wheezing, 1L nasal cannula-not chronic   R Recommendations: See Admitting Provider Note  Report given to:   Additional Notes: not c/o pain at this time

## 2018-08-20 NOTE — H&P (Signed)
[facility] NAME: Terri Burns    MR#:  [id number]  DATE OF BIRTH:  1950-07-28  DATE OF ADMISSION:  08/20/2018  PRIMARY CARE PHYSICIAN: Steele Sizer, MD   REQUESTING/REFERRING PHYSICIAN: Dr.: Jacqualine Code  CHIEF COMPLAINT: Fall, head laceration   Chief Complaint  Patient presents with  . Fall  . Head Laceration    HISTORY OF PRESENT ILLNESS:  Terri Burns  is a 68 y.o. female with a known history of COPD, depression, hypertension, hyperlipidemia came in because of the fall.  Patient had a recent fall on August 16, 2018 for for C2 odontoid fracture and been wearing cervical collar all the time but she fell again today morning and brought in by family.  When she came she had middle of the forehead wound open and has been bleeding, according to ER physician description patient had swollen soreness across the forehead, neck pain.  Patient also noticed bruise on the left foot when she dropped something on the foot 2 weeks ago.  Initially ER physician thought that she needs plastic surgery so requested transfer to: But patient was seen by Eddie Dibbles general from ENT, he saw forehead wound, because wound is also granulating it was difficult to approximate recommended secondary healing, may need plastic surgery at a later date.  After discharge from this hospital patient needs to see ENT for follow-up of forehead wound and refer patient to plastic surgery as an outpatient.  Patient also has C2 fracture no change in her odontoid fracture, ER physician discussed this with Dr. Lacinda Axon recommended continue cervical collar.  Patient cervical collar has been changed in the ER to have more comfort , [employer] came in ER to fit collar for the patient.  And she says this collar is better.  Patient now has Aspen collar.  But patient found to have hypotension, has not been eating well because of feeling uncomfortable with neck collar.  In the ER patient found to have  hypoxia with a room air sat 70%, initially required 3 L of oxygen, patient also had some rhonchi in the lungs, ER physician gave Solu-Medrol, azithromycin, now she is on 2 L and saturation 99% at that time of my visit.  She states that she is feeling better now.  I spoke with patient son.  We will admit the patient here for COPD exacerbation. PAST MEDICAL HISTORY:   Past Medical History:  Diagnosis Date  . COPD (chronic obstructive pulmonary disease) ([place])   . Depression   . GERD (gastroesophageal reflux disease)   . Hyperlipidemia   . Hypertension     PAST SURGICAL HISTOIRY:   Past Surgical History:  Procedure Laterality Date  . NECK SURGERY N/A 10/2003    SOCIAL HISTORY:   Social History   Tobacco Use  . Smoking status: Current Every Day Smoker    Packs/day: 1.00    Years: 45.00    Pack years: 45.00    Types: Cigarettes  . Smokeless tobacco: Never Used  Substance Use Topics  . Alcohol use: No    Alcohol/week: 0.0 standard drinks    FAMILY HISTORY:   Family History  Problem Relation Age of Onset  . Hypertension Mother     DRUG ALLERGIES:   Allergies  Allergen Reactions  . Levofloxacin Shortness Of Breath  . Penicillins Hives    itching  . Meloxicam     dizziness    REVIEW OF SYSTEMS:  CONSTITUTIONAL: Fatigue, generalized weakness, multiple falls  recently EYES: No blurred or double vision.  EARS, NOSE, AND THROAT: No tinnitus or ear pain.  Patient has neck collar. RESPIRATORY: No cough, shortness of breath, wheezing or hemoptysis.  CARDIOVASCULAR: No chest pain, orthopnea, edema.  GASTROINTESTINAL: No nausea, vomiting, diarrhea or abdominal pain.  GENITOURINARY: No dysuria, hematuria.  ENDOCRINE: No polyuria, nocturia,  HEMATOLOGY: No anemia, easy bruising or bleeding SKIN: No rash or lesion. MUSCULOSKELETAL: Right leg pain fall NEUROLOGIC: No tingling, numbness, weakness.  PSYCHIATRY: No anxiety or depression.   MEDICATIONS AT HOME:   Prior to  Admission medications   Medication Sig Start Date End Date Taking? Authorizing Provider  albuterol (VENTOLIN HFA) 108 (90 Base) MCG/ACT inhaler Inhale 2 puffs into the lungs every 6 (six) hours as needed for wheezing or shortness of breath. 11/22/17  Yes Poulose, Bethel Born, NP  aspirin EC 81 MG tablet Take 1 tablet (81 mg total) by mouth daily. 05/01/18  Yes Sowles, Drue Stager, MD  atorvastatin (LIPITOR) 20 MG tablet Take 1 tablet (20 mg total) by mouth daily. 05/01/18  Yes Sowles, Drue Stager, MD  celecoxib (CELEBREX) 200 MG capsule Take 1 capsule by mouth as needed.   Yes Kalman, Lennox Solders, DO  citalopram (CELEXA) 20 MG tablet Take 1 tablet (20 mg total) by mouth daily. 05/01/18  Yes Sowles, Drue Stager, MD  fluticasone (FLONASE) 50 MCG/ACT nasal spray SPRAY 2 SPRAYS INTO EACH NOSTRIL EVERY DAY 04/01/18  Yes Sowles, Drue Stager, MD  hydrochlorothiazide (MICROZIDE) 12.5 MG capsule TAKE 1 CAPSULE BY MOUTH EVERY DAY 07/25/18  Yes Sowles, Drue Stager, MD  ipratropium-albuterol (DUONEB) 0.5-2.5 (3) MG/3ML SOLN TAKE 3 MLS BY NEBULIZATION EVERY 6 (SIX) HOURS AS NEEDED. 06/08/17  Yes Roselee Nova, MD  loratadine (CLARITIN) 10 MG tablet Take 1 tablet (10 mg total) by mouth daily. 05/01/18  Yes Sowles, Drue Stager, MD  losartan (COZAAR) 100 MG tablet TAKE 1 TABLET BY MOUTH EVERY DAY 07/25/18  Yes Sowles, Drue Stager, MD  metoprolol succinate (TOPROL-XL) 25 MG 24 hr tablet TAKE 1 TABLET (25 MG TOTAL) BY MOUTH DAILY. 05/01/18  Yes Sowles, Drue Stager, MD  pantoprazole (PROTONIX) 40 MG tablet Take 1 tablet (40 mg total) by mouth daily. 05/01/18  Yes Sowles, Drue Stager, MD  tiZANidine (ZANAFLEX) 2 MG tablet Take 1 tablet (2 mg total) by mouth every evening. 11/22/17  Yes Poulose, Bethel Born, NP      VITAL SIGNS:  Blood pressure (!) 87/54, pulse 81, temperature 98.2 F (36.8 C), temperature source Oral, resp. rate 13, height 5\' 1"  (1.549 m), weight 61.2 kg, SpO2 98 %.  PHYSICAL EXAMINATION:  GENERAL:  68 y.o.-year-old patient lying in the  bed with no acute distress.  EYES: Pupils equal, round, reactive to light and accommodation. No scleral icterus. Extraocular muscles intact.  HEENT: Head atraumatic, normocephalic. Oropharynx and nasopharynx clear.  NECK:  Supple, no jugular venous distention. No thyroid enlargement, no tenderness.  Patient has cervical collar. LUNGS: Diminished breath sounds but mostly clear with no wheeze, no rales. CARDIOVASCULAR: S1, S2 normal. No murmurs, rubs, or gallops.  ABDOMEN: Soft, nontender, nondistended. Bowel sounds present. No organomegaly or mass.  EXTREMITIES: No pedal edema, cyanosis, or clubbing.  NEUROLOGIC: Cranial nerves II through XII are intact. Muscle strength 5/5 in all extremities. Sensation intact. Gait not checked.  PSYCHIATRIC: The patient is alert and oriented x 3.  SKIN: No obvious rash, lesion, or ulcer.   LABORATORY PANEL:   CBC Recent Labs  Lab 08/20/18 0819  WBC 10.0  HGB 9.0*  HCT 28.0*  PLT  320   ------------------------------------------------------------------------------------------------------------------  Chemistries  Recent Labs  Lab 08/20/18 0819  NA 136  K 3.0*  CL 101  CO2 22  GLUCOSE 104*  BUN 13  CREATININE 0.70  CALCIUM 8.2*   ------------------------------------------------------------------------------------------------------------------  Cardiac Enzymes Recent Labs  Lab 08/20/18 0819  TROPONINI <0.03   ------------------------------------------------------------------------------------------------------------------  RADIOLOGY:  Dg Chest 2 View  Result Date: 08/20/2018 CLINICAL DATA:  COPD and cough EXAM: CHEST - 2 VIEW COMPARISON:  03/01/2018 FINDINGS: Cardiac shadows within normal limits. Aortic calcifications are noted. Some mild scarring is again noted in the bases similar to that seen on prior CT examination. No acute infiltrate or sizable effusion is noted. Superior segment left lower lobe scarring is again noted as well. No  new focal abnormality is seen. IMPRESSION: Changes of scarring bilaterally.  No acute abnormality noted. Electronically Signed   By: Inez Catalina M.D.   On: 08/20/2018 08:22   Ct Head Wo Contrast  Result Date: 08/20/2018 CLINICAL DATA:  Recent fall and increased neck pain, history of odontoid fracture, initial encounter EXAM: CT HEAD WITHOUT CONTRAST CT CERVICAL SPINE WITHOUT CONTRAST TECHNIQUE: Multidetector CT imaging of the head and cervical spine was performed following the standard protocol without intravenous contrast. Multiplanar CT image reconstructions of the cervical spine were also generated. COMPARISON:  08/16/2018 FINDINGS: CT HEAD FINDINGS Brain: Atrophic changes are again identified and stable. No findings to suggest acute hemorrhage, acute infarction or space-occupying mass lesion are noted. Vascular: No hyperdense vessel or unexpected calcification. Skull: Normal. Negative for fracture or focal lesion. Sinuses/Orbits: Within normal limits. Other: Persistent right forehead laceration is noted. CT CERVICAL SPINE FINDINGS Alignment: Stable scoliosis concave to the left is noted. Skull base and vertebrae: There is again noted a fracture through the base of the odontoid without significant displacement when compared with the prior exam. Prior surgical fusion at C5-6 is noted. Changes of prior fusion at C6-7 are again seen and stable. Mild facet hypertrophic changes are noted particularly on the left. No new fracture is noted. Posterior fusion defect at C1 is noted. Soft tissues and spinal canal: Surrounding soft tissues demonstrate a hypodense lesion within the right lobe of the thyroid stable from the prior exam. Upper chest: Mild emphysematous changes are noted. Other: None IMPRESSION: CT of the head: Persistent right forehead laceration stable from the previous exam. No acute intracranial abnormality is seen. CT of cervical spine: Stable C2 fracture at the base of the odontoid without significant  increased displacement. No new fractures are seen. Stable right thyroid lesion. Electronically Signed   By: Inez Catalina M.D.   On: 08/20/2018 07:17   Ct Cervical Spine Wo Contrast  Result Date: 08/20/2018 CLINICAL DATA:  Recent fall and increased neck pain, history of odontoid fracture, initial encounter EXAM: CT HEAD WITHOUT CONTRAST CT CERVICAL SPINE WITHOUT CONTRAST TECHNIQUE: Multidetector CT imaging of the head and cervical spine was performed following the standard protocol without intravenous contrast. Multiplanar CT image reconstructions of the cervical spine were also generated. COMPARISON:  08/16/2018 FINDINGS: CT HEAD FINDINGS Brain: Atrophic changes are again identified and stable. No findings to suggest acute hemorrhage, acute infarction or space-occupying mass lesion are noted. Vascular: No hyperdense vessel or unexpected calcification. Skull: Normal. Negative for fracture or focal lesion. Sinuses/Orbits: Within normal limits. Other: Persistent right forehead laceration is noted. CT CERVICAL SPINE FINDINGS Alignment: Stable scoliosis concave to the left is noted. Skull base and vertebrae: There is again noted a fracture through the base of the odontoid without  significant displacement when compared with the prior exam. Prior surgical fusion at C5-6 is noted. Changes of prior fusion at C6-7 are again seen and stable. Mild facet hypertrophic changes are noted particularly on the left. No new fracture is noted. Posterior fusion defect at C1 is noted. Soft tissues and spinal canal: Surrounding soft tissues demonstrate a hypodense lesion within the right lobe of the thyroid stable from the prior exam. Upper chest: Mild emphysematous changes are noted. Other: None IMPRESSION: CT of the head: Persistent right forehead laceration stable from the previous exam. No acute intracranial abnormality is seen. CT of cervical spine: Stable C2 fracture at the base of the odontoid without significant increased  displacement. No new fractures are seen. Stable right thyroid lesion. Electronically Signed   By: Inez Catalina M.D.   On: 08/20/2018 07:17    EKG:   Orders placed or performed during the hospital encounter of 08/20/18  . ED EKG  . ED EKG    IMPRESSION AND PLAN:   Acute respiratory failure with hypoxia secondary to COPD exacerbation, initially required liters of oxygen, room air sats were 70% improved now with bronchodilators, steroids, admit the patient, continue oxygen and wean off as tolerated to room air to keep sats more than 90%, continue steroids, bronchodilators, empiric antibiotics. 2.  Acute bronchitis, flu test has been negative, chest x-ray did not show pneumonia.  Patient has history of centrilobular emphysema. 3.  C2 odontoid fracture, patient needs to wear cervical collar all the time, will consult neurosurgery. 4.  Open forehead wound, seen by ENT in the emergency room, patient wound is to be to approximate in the ER, recommended secondary healing, ENT to follow as an outpatient, patient may need plastic surgery, forehead and skin flap, and will consult wound care while she is in the hospital for keeping the open area on the forehead clean, she also needs nurse with home health care. 5.  Multiple falls, consult physical therapy 6/Hypotension;likely secondary to poor p.o. intake, patient states that because of cervical collar being uncomfortable she is not eating much recently.  Will order Ensure, liberalize diet for 1 to 2 days.  Because of hypotension hold all her hypertensive medication including losartan, HCTZ. 7.  Hypokalemia secondary to diuretics: Hold diuretics, replace potassium. All the records are reviewed and case discussed with ED provider. Management plans discussed with the patient, family and they are in agreement. #8. depression: Continue Lexapro. CODE STATUS: Full code  TOTAL TIME TAKING CARE OF THIS PATIENT; 55 minutes  Discussed with patient  son. Epifanio Lesches M.D on 08/20/2018 at 11:39 AM  Between 7am to 6pm - Pager - [phone]  After 6pm go to [email or web address] - password [email or web address] [facility]  [facility]  Office  [phone]  CC: Primary care physician; Steele Sizer, MD  Note: This dictation was prepared with Dragon dictation along with smaller phrase technology. Any transcriptional errors that result from this process are unintentional.

## 2018-08-20 NOTE — ED Notes (Signed)
Spoke to floor rn-denied any further question on report

## 2018-08-20 NOTE — ED Notes (Signed)
Called [employer] for c collar  [phone]

## 2018-08-20 NOTE — ED Triage Notes (Addendum)
Pt was seen here Thursday after a fall and had laceration repaired to forehead; pt says this am she was restless and tried to get off her bed at the end; fell, hitting her forehead on the floor and has reopened the stitches that were placed; pt also suffered an odontoid fracture and arrives wearing her c-collar as directed; pt says most of her pain is in her neck and the pain is a little worse since she fell

## 2018-08-20 NOTE — ED Notes (Signed)
[facility] has been contacted to obtain Ensure

## 2018-08-21 DIAGNOSIS — J209 Acute bronchitis, unspecified: Secondary | ICD-10-CM | POA: Diagnosis not present

## 2018-08-21 DIAGNOSIS — J9601 Acute respiratory failure with hypoxia: Secondary | ICD-10-CM | POA: Diagnosis not present

## 2018-08-21 DIAGNOSIS — E876 Hypokalemia: Secondary | ICD-10-CM | POA: Diagnosis not present

## 2018-08-21 DIAGNOSIS — J441 Chronic obstructive pulmonary disease with (acute) exacerbation: Secondary | ICD-10-CM | POA: Diagnosis not present

## 2018-08-21 DIAGNOSIS — I959 Hypotension, unspecified: Secondary | ICD-10-CM | POA: Diagnosis not present

## 2018-08-21 DIAGNOSIS — S12100A Unspecified displaced fracture of second cervical vertebra, initial encounter for closed fracture: Secondary | ICD-10-CM | POA: Diagnosis not present

## 2018-08-21 LAB — BASIC METABOLIC PANEL WITH GFR
Anion gap: 8 (ref 5–15)
BUN: 15 mg/dL (ref 8–23)
CO2: 23 mmol/L (ref 22–32)
Calcium: 8 mg/dL — ABNORMAL LOW (ref 8.9–10.3)
Chloride: 110 mmol/L (ref 98–111)
Creatinine, Ser: 0.59 mg/dL (ref 0.44–1.00)
GFR calc Af Amer: >60 mL/min
GFR calc non Af Amer: >60 mL/min
Glucose, Bld: 183 mg/dL — ABNORMAL HIGH (ref 70–99)
Potassium: 3.5 mmol/L (ref 3.5–5.1)
Sodium: 141 mmol/L (ref 135–145)

## 2018-08-21 LAB — CBC
HCT: 26.5 % — ABNORMAL LOW (ref 36.0–46.0)
HEMOGLOBIN: 8.2 g/dL — AB (ref 12.0–15.0)
MCH: 26.6 pg (ref 26.0–34.0)
MCHC: 30.9 g/dL (ref 30.0–36.0)
MCV: 86 fL (ref 80.0–100.0)
Platelets: 276 10*3/uL (ref 150–400)
RBC: 3.08 MIL/uL — ABNORMAL LOW (ref 3.87–5.11)
RDW: 17.5 % — ABNORMAL HIGH (ref 11.5–15.5)
WBC: 6.8 10*3/uL (ref 4.0–10.5)
nRBC: 0 % (ref 0.0–0.2)

## 2018-08-21 LAB — GLUCOSE, CAPILLARY: Glucose-Capillary: 137 mg/dL — ABNORMAL HIGH (ref 70–99)

## 2018-08-21 MED ORDER — TORSEMIDE 20 MG PO TABS: 20.0000 mg | ORAL_TABLET | Freq: Every day | ORAL | 0 refills | Status: DC

## 2018-08-21 MED ORDER — IBUPROFEN 400 MG PO TABS: 400.0000 mg | ORAL_TABLET | Freq: Four times a day (QID) | ORAL | 0 refills | Status: DC | PRN

## 2018-08-21 MED ORDER — HYDROCODONE-ACETAMINOPHEN 5-325 MG PO TABS: 1.0000 | ORAL_TABLET | Freq: Two times a day (BID) | ORAL | 0 refills | Status: AC | PRN

## 2018-08-21 NOTE — TOC Initial Note (Signed)
Transition of Care (TOC) - Initial/Assessment Note   Met with the patient in the room and provided [facility] choice list oper CMS.gov, Patient would like to use [facility], Notified Jason per Secure email The patient will be going to stay with the son and his wife for a couple of weeks the address is [address] [place] [address] [place] [address] The patient has a rollator at home and states that she does not need any other DME equipment No other needs noted Contact information provided to the patient    Patient Details  Name: Terri Burns MRN: [id number] Date of Birth: 1950/10/04  Transition of Care [facility]) CM/SW Contact:    Su Hilt, RN Phone Number: 08/21/2018, 10:13 AM  Clinical Narrative:                   Expected Discharge Plan: [place] Barriers to Discharge: Continued Medical Work up   Patient Goals and CMS Choice Patient states their goals for this hospitalization and ongoing recovery are:: go to her sons house CMS Medicare.gov Compare Post Acute Care list provided to:: Patient Choice offered to / list presented to : Patient  Expected Discharge Plan and Services Expected Discharge Plan: [place] Discharge Planning Services: CM Consult Post Acute Care Choice: [place] arrangements for the past 2 months: Apartment                 DME Arranged: (none needed)   HH Arranged: PT HH Agency: [facility])  Prior Living Arrangements/Services Living arrangements for the past 2 months: Apartment Lives with:: Self Patient language and need for interpreter reviewed:: Yes Do you feel safe going back to the place where you live?: Yes      Need for Family Participation in Patient Care: No (Comment) Care giver support system in place?: Yes (comment) Current home services: DME(4 wheel walker) Criminal Activity/Legal Involvement Pertinent to Current Situation/Hospitalization: No - Comment as needed  Activities of Daily  Living Home Assistive Devices/Equipment: Gilford Rile (specify type) ADL Screening (condition at time of admission) Patient's cognitive ability adequate to safely complete daily activities?: Yes Is the patient deaf or have difficulty hearing?: No Does the patient have difficulty seeing, even when wearing glasses/contacts?: No Does the patient have difficulty concentrating, remembering, or making decisions?: No Patient able to express need for assistance with ADLs?: Yes Does the patient have difficulty dressing or bathing?: Yes Independently performs ADLs?: No Communication: Independent Dressing (OT): Needs assistance Is this a change from baseline?: Pre-admission baseline Grooming: Needs assistance Is this a change from baseline?: Pre-admission baseline Feeding: Independent Is this a change from baseline?: Pre-admission baseline Bathing: Needs assistance Is this a change from baseline?: Pre-admission baseline Toileting: Needs assistance Is this a change from baseline?: Pre-admission baseline In/Out Bed: Needs assistance Is this a change from baseline?: Pre-admission baseline Walks in Home: Independent with device (comment) Does the patient have difficulty walking or climbing stairs?: Yes Weakness of Legs: Both Weakness of Arms/Hands: Both  Permission Sought/Granted Permission sought to share information with : Case Manager Permission granted to share information with : Yes, Verbal Permission Granted     Permission granted to share info w AGENCY: [facility]        Emotional Assessment Appearance:: Appears stated age Attitude/Demeanor/Rapport: Engaged Affect (typically observed): Accepting Orientation: : Oriented to Self, Oriented to Place, Oriented to  Time, Oriented to Situation Alcohol / Substance Use: Tobacco Use Psych Involvement: No (comment)  Admission diagnosis:  Hypokalemia [E87.6] COPD with acute exacerbation ([place]) [J44.1] Forehead laceration, sequela [S01.81XS] Acute  respiratory failure with hypoxemia ([place]) [J96.01] Patient Active Problem List   Diagnosis Date Noted  . Acute respiratory failure with hypoxemia ([place]) 08/20/2018  . Skin lesion of face 05/01/2018  . Atherosclerosis of aorta ([place]) 03/04/2018  . Coronary artery calcification of native artery 03/04/2018  . 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 Chronic pain 01/01/2018  . Benign essential HTN 01/01/2018  . Senile purpura ([place]) 01/01/2018  . Recurrent major depressive disorder, in full remission ([place]) 01/01/2018  . Hyperglycemia 09/27/2016  . Tobacco abuse 08/20/2015  . Allergic rhinitis, seasonal 05/12/2015  . Emphysema lung ([place]) 05/12/2015  . Degeneration of intervertebral disc of cervical region 05/12/2015  . GERD (gastroesophageal reflux disease) 05/12/2015  . Major depression in remission ([place]) 05/12/2015   PCP:  Steele Sizer, MD Pharmacy:   [id number] [place] [place]Phone: [phone]: [phone]    Social Determinants of Health (SDOH) Interventions    Readmission Risk Interventions 30 Day Unplanned Readmission Risk Score     ED to Hosp-Admission (Current) from 08/20/2018 in [place](2A)  30 Day Unplanned Readmission Risk Score (%)  14 Filed at 08/21/2018 0801     This score is the patient's risk of an unplanned readmission within 30 days of being discharged (0 -100%). The score is based on dignosis, age, lab data, medications, orders, and past utilization.   Low:  0-14.9   Medium: 15-21.9   High: 22-29.9   Extreme: 30 and above       No flowsheet data found.

## 2018-08-21 NOTE — Progress Notes (Signed)
Family Meeting Note  Advance Directive:yes  Today a meeting took place with the Patient and son, daughter in law at bedside.  The following clinical team members were present during this meeting:MD  The following were discussed:Patient's diagnosis: 68 y.o. female with a known history of COPD, depression, hypertension, hyperlipidemia came in because of the fall. Has had recurrent fall. - likely multifactorial with possibly hypotension/dizziness causing the recent fall.  Past Medical History:  Diagnosis Date  . COPD (chronic obstructive pulmonary disease) ([place])   . Depression   . GERD (gastroesophageal reflux disease)   . Hyperlipidemia   . Hypertension     Patient's progosis: > 12 months and Goals for treatment: Full Code, had more discussion with son and daughter in law. Explained overall poor prognosis, high risk for recurrent fall and consideration to accept outpt palliative care which they're in agreement. Also to consider DNR which they will think about as well.  Time spent during discussion:30 minutes  Max Sane, MD

## 2018-08-21 NOTE — Plan of Care (Signed)
?  Problem: Activity: ?Goal: Ability to tolerate increased activity will improve ?Outcome: Progressing ?  ?Problem: Respiratory: ?Goal: Levels of oxygenation will improve ?Outcome: Progressing ?  ?

## 2018-08-21 NOTE — Progress Notes (Signed)
Physical Therapy Treatment Patient Details Name: Terri Burns MRN: [id number] DOB: 03/05/1951 Today's Date: 08/21/2018    History of Present Illness Patient is a 68 year old female who has had 2 falls in the last week. Recent odontoid fracture, now C2 fracture and forehead wound. Patient admitted with COPD exacerbation. PMH to include: COPD, depression, HTN, HLD.     PT Comments    Patient agreeable to PT, sitting EOB at start of session cervical collar in place and maintained throughout. Reported mild pain and neck and foot. With RN consent pt trialed on room air. Performed seated therapeutic exercises with minimal verbal cues. Sit <> stand several times during session with CGA and RW. Patient ambulated ~13ft with RW and CGA, and then ~5ft, some complaints of LE pain. spO2 monitored throughout, 90% or greater on room air. Pt up in chair with all needs in reach at 96% on room air and 74 BPM. The patient would benefit from further skilled PT to continue to progress towards goals and maximize functional mobility, safety, and decrease risk of falls.   SaO2 on room air at rest = 97% SaO2 on room air while ambulating = 90%     Follow Up Recommendations  Home health PT     Equipment Recommendations  None recommended by PT    Recommendations for Other Services       Precautions / Restrictions Precautions Precautions: Fall;Cervical Precaution Comments: patient has sore right foot, was wearing boot at home.  Required Braces or Orthoses: Cervical Brace Cervical Brace: Hard collar;At all times Restrictions Weight Bearing Restrictions: No    Mobility  Bed Mobility               General bed mobility comments: Pt sitting EOB at start of session  Transfers Overall transfer level: Needs assistance Equipment used: Rolling walker (2 wheeled) Transfers: Sit to/from Stand Sit to Stand: Min guard         General transfer comment: sore R foot with ambulation  Ambulation/Gait   Gait Distance (Feet): 85 Feet Assistive device: Rolling walker (2 wheeled)       General Gait Details: Patient needed occasional cues for AD management, slow shuffling steps   Stairs             Wheelchair Mobility    Modified Rankin (Stroke Patients Only)       Balance Overall balance assessment: Needs assistance Sitting-balance support: Feet supported Sitting balance-Leahy Scale: Good       Standing balance-Leahy Scale: Fair                              Cognition Arousal/Alertness: Awake/alert Behavior During Therapy: WFL for tasks assessed/performed Overall Cognitive Status: Within Functional Limits for tasks assessed                                        Exercises Other Exercises Other Exercises: Pt demonstrated commode transfer with CGA, minA for dressing needs provided, but not required    General Comments        Pertinent Vitals/Pain Pain Assessment: Faces Faces Pain Scale: Hurts a little bit Pain Location: right foot, neck Pain Descriptors / Indicators: Aching;Sore Pain Intervention(s): Limited activity within patient's tolerance;Monitored during session;Repositioned    Home Living  Prior Function            PT Goals (current goals can now be found in the care plan section) Progress towards PT goals: Progressing toward goals    Frequency    Min 2X/week      PT Plan Current plan remains appropriate    Co-evaluation              AM-PAC PT "6 Clicks" Mobility   Outcome Measure  Help needed turning from your back to your side while in a flat bed without using bedrails?: A Little Help needed moving from lying on your back to sitting on the side of a flat bed without using bedrails?: A Little Help needed moving to and from a bed to a chair (including a wheelchair)?: A Little Help needed standing up from a chair using your arms (e.g., wheelchair or bedside chair)?: A  Little Help needed to walk in hospital room?: A Little Help needed climbing 3-5 steps with a railing? : A Lot 6 Click Score: 17    End of Session Equipment Utilized During Treatment: Cervical collar Activity Tolerance: Patient limited by pain Patient left: with chair alarm set;in chair;with call bell/phone within reach Nurse Communication: Mobility status PT Visit Diagnosis: Unsteadiness on feet (R26.81);Muscle weakness (generalized) (M62.81);Difficulty in walking, not elsewhere classified (R26.2);Repeated falls (R29.6)     Time: 4163-8453 PT Time Calculation (min) (ACUTE ONLY): 29 min  Charges:  $Therapeutic Exercise: 8-22 mins $Therapeutic Activity: 8-22 mins                    Lieutenant Diego PT, DPT 11:58 AM,08/21/18 [phone]

## 2018-08-21 NOTE — Consult Note (Signed)
[place] Nurse wound consult note Reason for Consult:forehead laceration  Wound type: laceration Pressure Injury POA: NA Measurement: 7cm x 4.5cm x 0.3cm  Wound bed: large open wound with depth, skin has dehisced from previous repair leaving large defect.  Drainage (amount, consistency, odor) serosanguinous  Periwound: bruising  Dressing procedure/placement/frequency: Single layer of xeroform, cover with dry 4x4 gauze. Use stocking cap made by WOC to hold in place. No tape.  Would consider plastic surgery consultation if possible   Discussed POC with patient and bedside nurse.  Re consult if needed, will not follow at this time. Thanks  Daisean Brodhead [employer], RN,CWOCN, CNS, [place] [phone])

## 2018-08-21 NOTE — Progress Notes (Signed)
Family Meeting Note  Advance Directive:yes  Today a meeting took place with the Patient.  The following clinical team members were present during this meeting:MD  The following were discussed:Patient's diagnosis: 68 y.o. female with a known history of COPD, depression, hypertension, hyperlipidemia came in because of the fall. Has had recurrent fall. - likely multifactorial with possibly hypotension/dizziness causing the recent fall.  Past Medical History:  Diagnosis Date  . COPD (chronic obstructive pulmonary disease) ([place])   . Depression   . GERD (gastroesophageal reflux disease)   . Hyperlipidemia   . Hypertension     Patient's progosis: > 12 months and Goals for treatment: Full Code  Additional follow-up to be provided: once other family members arrive.   Time spent during discussion:30 minutes  Max Sane, MD

## 2018-08-21 NOTE — Progress Notes (Signed)
Home Care Wound Care every other day Single layer of xeroform, cover with dry 4x4 gauze. Use stocking cap made by WOC to hold in place. No tape

## 2018-08-22 ENCOUNTER — Telehealth: Payer: Self-pay

## 2018-08-22 ENCOUNTER — Telehealth: Payer: Self-pay | Admitting: Family Medicine

## 2018-08-22 NOTE — TOC Progression Note (Addendum)
Transition of Care [facility]) - Progression Note    Patient Details  Name: Terri Burns MRN: [id number] Date of Birth: [birth year]  Transition of Care [facility]) CM/SW Contact  Katrina Stack, RN Phone Number: 08/22/2018, 9:20 AM  Clinical Narrative:    CM informed that outpatient palliative referral needs to be made to [facility].  Notified liaison    Expected Discharge Plan: [place] Barriers to Discharge: Continued Medical Work up  Expected Discharge Plan and Services Expected Discharge Plan: [place] Discharge Planning Services: CM Consult Post Acute Care Choice: [place] arrangements for the past 2 months: Apartment Expected Discharge Date: 08/21/18               DME Arranged: (none needed)   HH Arranged: PT HH Agency: [place] ([facility])   Social Determinants of Health (SDOH) Interventions    Readmission Risk Interventions 30 Day Unplanned Readmission Risk Score     ED to Hosp-Admission (Discharged) from 08/20/2018 in [place] (2A)  30 Day Unplanned Readmission Risk Score (%)  14 Filed at 08/21/2018 1600     This score is the patient's risk of an unplanned readmission within 30 days of being discharged (0 -100%). The score is based on dignosis, age, lab data, medications, orders, and past utilization.   Low:  0-14.9   Medium: 15-21.9   High: 22-29.9   Extreme: 30 and above       No flowsheet data found.

## 2018-08-22 NOTE — Telephone Encounter (Signed)
Attempted to reach patient for TCM call and to schedule hospital follow up appt.   Left msg for patient to contact the office at [phone].

## 2018-08-22 NOTE — Telephone Encounter (Signed)
Copied from [place] [id number]. Topic: Quick Communication - See Telephone Encounter >> Aug 22, 2018  9:58 AM Ahmed Prima L wrote: CRM for notification. See Telephone encounter for: 08/22/18.  Erline Levine with [facility] called and stated they received a referral from a hospitalitis at [facility] for her to have Palliative Care. She would like to know if Dr Ancil Boozer would be willing to follow the patient for this. Her call back number is [phone]

## 2018-08-23 ENCOUNTER — Telehealth: Payer: Self-pay | Admitting: Family Medicine

## 2018-08-23 DIAGNOSIS — Z791 Long term (current) use of non-steroidal anti-inflammatories (NSAID): Secondary | ICD-10-CM | POA: Diagnosis not present

## 2018-08-23 DIAGNOSIS — Z7982 Long term (current) use of aspirin: Secondary | ICD-10-CM | POA: Diagnosis not present

## 2018-08-23 DIAGNOSIS — S0181XD Laceration without foreign body of other part of head, subsequent encounter: Secondary | ICD-10-CM | POA: Diagnosis not present

## 2018-08-23 DIAGNOSIS — W19XXXD Unspecified fall, subsequent encounter: Secondary | ICD-10-CM | POA: Diagnosis not present

## 2018-08-23 DIAGNOSIS — F17210 Nicotine dependence, cigarettes, uncomplicated: Secondary | ICD-10-CM | POA: Diagnosis not present

## 2018-08-23 DIAGNOSIS — S12190D Other displaced fracture of second cervical vertebra, subsequent encounter for fracture with routine healing: Secondary | ICD-10-CM | POA: Diagnosis not present

## 2018-08-23 DIAGNOSIS — Z79891 Long term (current) use of opiate analgesic: Secondary | ICD-10-CM | POA: Diagnosis not present

## 2018-08-23 DIAGNOSIS — J441 Chronic obstructive pulmonary disease with (acute) exacerbation: Secondary | ICD-10-CM | POA: Diagnosis not present

## 2018-08-23 NOTE — Telephone Encounter (Signed)
Home Care called in - they are at the home to admit the patient into their care for wound care on her forehead, but found her to be hypotensive.  She was discharged from hospital 08/21/2018 and BP was 87/54 at that time.  Today's Vitals: BP 90/48, HR 88, RR 18, O2 95% In NAD, does have +2 pitting edema in BLE, and is having orthostatic lightheadedness (no worse than when she was in hospital). Has been in bed all day today, at baseline cognitively.   Advised to D/C HCTZ, add home health check on Saturday 08/25/2018 to recheck vital signs.

## 2018-08-23 NOTE — Discharge Summary (Signed)
[facility] [place] NAME: Terri Burns    MR#:  [id number]  DATE OF BIRTH:  Oct 05, 1950  DATE OF ADMISSION:  08/20/2018   ADMITTING PHYSICIAN: Epifanio Lesches, MD  DATE OF DISCHARGE: 08/21/2018  6:58 PM  PRIMARY CARE PHYSICIAN: Steele Sizer, MD   ADMISSION DIAGNOSIS:  Hypokalemia [E87.6] COPD with acute exacerbation ([place]) [J44.1] Forehead laceration, sequela [S01.81XS] Acute respiratory failure with hypoxemia ([place]) [J96.01] DISCHARGE DIAGNOSIS:  Active Problems:   Acute respiratory failure with hypoxemia ([place])  SECONDARY DIAGNOSIS:   Past Medical History:  Diagnosis Date  . COPD (chronic obstructive pulmonary disease) ([place])   . Depression   . GERD (gastroesophageal reflux disease)   . Hyperlipidemia   . Hypertension    HOSPITAL COURSE:  68 y.o. female with a known history of COPD, depression, hypertension, hyperlipidemia admitted due to fall.   * Recurrent fall: multifactorial, stop losartan as BP had been soft and could make her feel dizzi leading to fall - Patient had a recent fall on August 16, 2018 and C2 odontoid fracture and been wearing cervical collar all the time but she fell again today on the day of admission and brought in by family.  - evaluated by PT - Patient ambulated ~85ft with RW and CGA, and then ~38ft, some complaints of LE pain  * C2 odontoid fracture - no change in her odontoid fracture, ER physician discussed this with Dr. Lacinda Axon (Neurosuregry) who recommended continue cervical collar.  Patient cervical collar has been changed in the ER to have more comfort , [employer] came in ER to fit collar for the patient.  And she says this collar is better -  Patient needs to wear cervical collar all the time, outpt neurosurgery eval as need  * Acute respiratory failure with hypoxia secondary to COPD exacerbation, initially required liters of oxygen, room air sats were 70% improved now with bronchodilators, steroids  - not needing O2 now  * Acute bronchitis, flu test has been negative, chest x-ray did not show pneumonia.  Patient has history of centrilobular emphysema.  * Forehead Laceration: When she came she had middle of the forehead wound open and has been bleeding, according to ER physician description patient had swollen soreness across the forehead, neck pain.  Patient also noticed bruise on the left foot when she dropped something on the foot 2 weeks ago.  Initially ER physician thought that she needs plastic surgery so requested transfer to: But patient was seen by Lacie Draft from ENT, he saw forehead wound, because wound is also granulating it was difficult to approximate recommended secondary healing, may need plastic surgery at a later date per them.   - for now continue dressing changes recommended by WOC  * Hypotension; likely secondary to poor p.o. intake, patient states that because of cervical collar being uncomfortable she is not eating much recently.   - I'm stopping losartan but if BP remains low consider stopping HCTZ as an outpt.  *  Hypokalemia secondary to diuretics - repleted  * Depression: Continue Lexapro.  * LE swelling/abd swelling: Her H & H remained relatively stable but I wouldn't be surprised if she has some hematoma - doubt much can be done. I offered son/patient to see if they would like for me to scan her abd/leg - regardless she wouldn't be a candidate for anticoagulation considering recurrent falls and she is already bruised all over her body especially her legs/face..  - I've offered to  start Torsemide for her LE swelling as family was really concerned about LE swelling. I've encouraged her to elevated the leg if in bed and try to ambulate much possible.  * Goals of care: I had long d/w her son and daughter in law at bedside before D/C. I have recommend palliative care to follow at home. I highly recommend Hospice if she qualifies. Family was upset at first but seem to  realize poor prognosis. PT evaluated her and recommended HHPT so I've set it up. She will likely continue to get worse and very high risk for readmissions DISCHARGE CONDITIONS:  fair CONSULTS OBTAINED:   DRUG ALLERGIES:   Allergies  Allergen Reactions  . Levofloxacin Shortness Of Breath  . Penicillins Hives    itching  . Meloxicam     dizziness  . Nsaids Other (See Comments)    Patient states she can tolerate up to three doses per day without incident   DISCHARGE MEDICATIONS:   Allergies as of 08/21/2018      Reactions   Levofloxacin Shortness Of Breath   Penicillins Hives   itching   Meloxicam    dizziness   Nsaids Other (See Comments)   Patient states she can tolerate up to three doses per day without incident      Medication List    STOP taking these medications   losartan 100 MG tablet Commonly known as:  COZAAR     TAKE these medications   albuterol 108 (90 Base) MCG/ACT inhaler Commonly known as:  Ventolin HFA Inhale 2 puffs into the lungs every 6 (six) hours as needed for wheezing or shortness of breath.   aspirin EC 81 MG tablet Take 1 tablet (81 mg total) by mouth daily.   atorvastatin 20 MG tablet Commonly known as:  LIPITOR Take 1 tablet (20 mg total) by mouth daily.   CeleBREX 200 MG capsule Generic drug:  celecoxib Take 1 capsule by mouth as needed.   citalopram 20 MG tablet Commonly known as:  CELEXA Take 1 tablet (20 mg total) by mouth daily.   fluticasone 50 MCG/ACT nasal spray Commonly known as:  FLONASE SPRAY 2 SPRAYS INTO EACH NOSTRIL EVERY DAY Notes to patient:  NONE GIVEN TODAY   HYDROcodone-acetaminophen 5-325 MG tablet Commonly known as:  NORCO/VICODIN Take 1 tablet by mouth every 12 (twelve) hours as needed for up to 3 days for severe pain.   ibuprofen 400 MG tablet Commonly known as:  ADVIL,MOTRIN Take 1 tablet (400 mg total) by mouth every 6 (six) hours as needed.   ipratropium-albuterol 0.5-2.5 (3) MG/3ML Soln Commonly  known as:  DUONEB TAKE 3 MLS BY NEBULIZATION EVERY 6 (SIX) HOURS AS NEEDED.   loratadine 10 MG tablet Commonly known as:  CLARITIN Take 1 tablet (10 mg total) by mouth daily. Notes to patient:  NONE GIVEN TODAY   metoprolol succinate 25 MG 24 hr tablet Commonly known as:  TOPROL-XL TAKE 1 TABLET (25 MG TOTAL) BY MOUTH DAILY. Notes to patient:  NONE GIVEN TODAY   pantoprazole 40 MG tablet Commonly known as:  PROTONIX Take 1 tablet (40 mg total) by mouth daily. Notes to patient:  NONE GIVEN TODAY   tiZANidine 2 MG tablet Commonly known as:  ZANAFLEX Take 1 tablet (2 mg total) by mouth every evening.   torsemide 20 MG tablet Commonly known as:  DEMADEX Take 1 tablet (20 mg total) by mouth daily.        DISCHARGE INSTRUCTIONS:  For forehead laceration  Measurement: 7cm x 4.5cm x 0.3cm   Wound bed: large open wound with depth, skin has dehisced from previous repair leaving large defect.  Drainage (amount, consistency, odor) serosanguinous  Periwound: bruising  Dressing procedure/placement/frequency: Single layer of xeroform, cover with dry 4x4 gauze. Use stocking cap made by WOC to hold in place. No tape.  She needs plastic surgery consultation as an outpt if possible and I've requested it DIET:  Regular diet DISCHARGE CONDITION:  Fair ACTIVITY:  Activity as tolerated OXYGEN:  Home Oxygen: No.  Oxygen Delivery: room air DISCHARGE LOCATION:  Home with HHPT, RN, N, [place] and Palliative care to follow  If you experience worsening of your admission symptoms, develop shortness of breath, life threatening emergency, suicidal or homicidal thoughts you must seek medical attention immediately by calling 911 or calling your MD immediately  if symptoms less severe.  You Must read complete instructions/literature along with all the possible adverse reactions/side effects for all the Medicines you take and that have been prescribed to you. Take any new Medicines after you have  completely understood and accpet all the possible adverse reactions/side effects.   Please note  You were cared for by a hospitalist during your hospital stay. If you have any questions about your discharge medications or the care you received while you were in the hospital after you are discharged, you can call the unit and asked to speak with the hospitalist on call if the hospitalist that took care of you is not available. Once you are discharged, your primary care physician will handle any further medical issues. Please note that NO REFILLS for any discharge medications will be authorized once you are discharged, as it is imperative that you return to your primary care physician (or establish a relationship with a primary care physician if you do not have one) for your aftercare needs so that they can reassess your need for medications and monitor your lab values.    On the day of Discharge:  VITAL SIGNS:  Blood pressure 112/75, pulse 82, temperature 98.7 F (37.1 C), temperature source Oral, resp. rate 20, height 5\' 1"  (1.549 m), weight 66.9 kg, SpO2 96 %. PHYSICAL EXAMINATION:  GENERAL:  68 y.o.-year-old patient lying in the bed with no acute distress.  EYES: Pupils equal, round, reactive to light and accommodation. No scleral icterus. Extraocular muscles intact.  HEENT: Head atraumatic, normocephalic. Oropharynx and nasopharynx clear.  NECK:  Supple, no jugular venous distention. No thyroid enlargement, no tenderness.  LUNGS: Normal breath sounds bilaterally, no wheezing, rales,rhonchi or crepitation. No use of accessory muscles of respiration.  CARDIOVASCULAR: S1, S2 normal. No murmurs, rubs, or gallops.  ABDOMEN: Soft, non-tender, non-distended. Bowel sounds present. No organomegaly or mass.  EXTREMITIES: No pedal edema, cyanosis, or clubbing.  NEUROLOGIC: Cranial nerves II through XII are intact. Muscle strength 5/5 in all extremities. Sensation intact. Gait not checked.  PSYCHIATRIC:  The patient is alert and oriented x 3.  SKIN: No obvious rash, lesion, or ulcer.  DATA REVIEW:   CBC Recent Labs  Lab 08/21/18 0445  WBC 6.8  HGB 8.2*  HCT 26.5*  PLT 276    Chemistries  Recent Labs  Lab 08/21/18 0445  NA 141  K 3.5  CL 110  CO2 23  GLUCOSE 183*  BUN 15  CREATININE 0.59  CALCIUM 8.0*     Follow-up Information    Steele Sizer, MD. Schedule an appointment as soon as possible for a visit in 5 day(s).   Specialty:  Family Medicine Contact information: [address] [place] [address] [phone]        Meade Maw, MD. Schedule an appointment as soon as possible for a visit in 1 week(s).   Specialty:  Neurosurgery Contact information: [place] [address] [phone]        Margaretha Sheffield, MD. Schedule an appointment as soon as possible for a visit in 1 week(s).   Specialty:  Otolaryngology Why:  for vestibular rehab Contact information: [address] [place] [address] [phone]        Crissie Reese, MD. Schedule an appointment as soon as possible for a visit in 2 week(s).   Specialty:  Plastic Surgery Why:  for facila wound f/up Contact information: [place] [address] [phone]           Management plans discussed with the patient, family (son & daughter) and they are in agreement.  CODE STATUS: Full Code but highly recommend DNR if family and patient in agreement  TOTAL TIME TAKING CARE OF THIS PATIENT: 45 minutes.    Max Sane M.D on 08/23/2018 at 6:36 PM  Between 7am to 6pm - Pager - [phone]  After 6pm go to [email or web address] - [employer] [facility]  Office  [phone]  CC: Primary care physician; Steele Sizer, MD   Note: This dictation was prepared with Dragon dictation along with smaller phrase technology. Any transcriptional errors that result from this process are  unintentional.

## 2018-08-24 DIAGNOSIS — J441 Chronic obstructive pulmonary disease with (acute) exacerbation: Secondary | ICD-10-CM | POA: Diagnosis not present

## 2018-08-24 DIAGNOSIS — W19XXXD Unspecified fall, subsequent encounter: Secondary | ICD-10-CM | POA: Diagnosis not present

## 2018-08-24 DIAGNOSIS — S12190D Other displaced fracture of second cervical vertebra, subsequent encounter for fracture with routine healing: Secondary | ICD-10-CM | POA: Diagnosis not present

## 2018-08-24 DIAGNOSIS — Z7982 Long term (current) use of aspirin: Secondary | ICD-10-CM | POA: Diagnosis not present

## 2018-08-24 DIAGNOSIS — S0181XD Laceration without foreign body of other part of head, subsequent encounter: Secondary | ICD-10-CM | POA: Diagnosis not present

## 2018-08-24 DIAGNOSIS — F17210 Nicotine dependence, cigarettes, uncomplicated: Secondary | ICD-10-CM | POA: Diagnosis not present

## 2018-08-25 DIAGNOSIS — S12190D Other displaced fracture of second cervical vertebra, subsequent encounter for fracture with routine healing: Secondary | ICD-10-CM | POA: Diagnosis not present

## 2018-08-25 DIAGNOSIS — F17210 Nicotine dependence, cigarettes, uncomplicated: Secondary | ICD-10-CM | POA: Diagnosis not present

## 2018-08-25 DIAGNOSIS — W19XXXD Unspecified fall, subsequent encounter: Secondary | ICD-10-CM | POA: Diagnosis not present

## 2018-08-25 DIAGNOSIS — Z7982 Long term (current) use of aspirin: Secondary | ICD-10-CM | POA: Diagnosis not present

## 2018-08-25 DIAGNOSIS — J441 Chronic obstructive pulmonary disease with (acute) exacerbation: Secondary | ICD-10-CM | POA: Diagnosis not present

## 2018-08-25 DIAGNOSIS — S0181XD Laceration without foreign body of other part of head, subsequent encounter: Secondary | ICD-10-CM | POA: Diagnosis not present

## 2018-08-27 DIAGNOSIS — J441 Chronic obstructive pulmonary disease with (acute) exacerbation: Secondary | ICD-10-CM | POA: Diagnosis not present

## 2018-08-27 DIAGNOSIS — S12190D Other displaced fracture of second cervical vertebra, subsequent encounter for fracture with routine healing: Secondary | ICD-10-CM | POA: Diagnosis not present

## 2018-08-27 DIAGNOSIS — Z7982 Long term (current) use of aspirin: Secondary | ICD-10-CM | POA: Diagnosis not present

## 2018-08-27 DIAGNOSIS — W19XXXD Unspecified fall, subsequent encounter: Secondary | ICD-10-CM | POA: Diagnosis not present

## 2018-08-27 DIAGNOSIS — S0181XD Laceration without foreign body of other part of head, subsequent encounter: Secondary | ICD-10-CM | POA: Diagnosis not present

## 2018-08-27 DIAGNOSIS — F17210 Nicotine dependence, cigarettes, uncomplicated: Secondary | ICD-10-CM | POA: Diagnosis not present

## 2018-08-29 DIAGNOSIS — S12190D Other displaced fracture of second cervical vertebra, subsequent encounter for fracture with routine healing: Secondary | ICD-10-CM | POA: Diagnosis not present

## 2018-08-29 DIAGNOSIS — S0181XD Laceration without foreign body of other part of head, subsequent encounter: Secondary | ICD-10-CM | POA: Diagnosis not present

## 2018-08-29 DIAGNOSIS — Z7982 Long term (current) use of aspirin: Secondary | ICD-10-CM | POA: Diagnosis not present

## 2018-08-29 DIAGNOSIS — F17210 Nicotine dependence, cigarettes, uncomplicated: Secondary | ICD-10-CM | POA: Diagnosis not present

## 2018-08-29 DIAGNOSIS — J441 Chronic obstructive pulmonary disease with (acute) exacerbation: Secondary | ICD-10-CM | POA: Diagnosis not present

## 2018-08-29 DIAGNOSIS — W19XXXD Unspecified fall, subsequent encounter: Secondary | ICD-10-CM | POA: Diagnosis not present

## 2018-08-29 NOTE — Telephone Encounter (Signed)
Done

## 2018-08-29 NOTE — Telephone Encounter (Signed)
Copied from [place] [id number]. Topic: Quick Communication - Home Health Verbal Orders >> Aug 29, 2018 12:10 PM Ivar Drape wrote: Caller/Agency:   Gerald Stabs PT w/[facility] Callback Number:   [phone] Requesting OT/PT/Skilled Nursing/Social Work/Speech Therapy:  PT - This is his 3rd request, and need to go out there today Frequency:  2w4

## 2018-08-30 ENCOUNTER — Ambulatory Visit (INDEPENDENT_AMBULATORY_CARE_PROVIDER_SITE_OTHER): Payer: Medicare Other | Admitting: Family Medicine

## 2018-08-30 ENCOUNTER — Encounter: Payer: Self-pay | Admitting: Family Medicine

## 2018-08-30 ENCOUNTER — Other Ambulatory Visit: Payer: Self-pay

## 2018-08-30 DIAGNOSIS — Z23 Encounter for immunization: Secondary | ICD-10-CM | POA: Diagnosis not present

## 2018-08-30 DIAGNOSIS — F3342 Major depressive disorder, recurrent, in full remission: Secondary | ICD-10-CM | POA: Diagnosis not present

## 2018-08-30 DIAGNOSIS — D649 Anemia, unspecified: Secondary | ICD-10-CM

## 2018-08-30 DIAGNOSIS — Z09 Encounter for follow-up examination after completed treatment for conditions other than malignant neoplasm: Secondary | ICD-10-CM

## 2018-08-30 DIAGNOSIS — Z9181 History of falling: Secondary | ICD-10-CM | POA: Diagnosis not present

## 2018-08-30 DIAGNOSIS — I10 Essential (primary) hypertension: Secondary | ICD-10-CM

## 2018-08-30 DIAGNOSIS — J432 Centrilobular emphysema: Secondary | ICD-10-CM | POA: Diagnosis not present

## 2018-08-30 DIAGNOSIS — E876 Hypokalemia: Secondary | ICD-10-CM

## 2018-08-30 DIAGNOSIS — S12100A Unspecified displaced fracture of second cervical vertebra, initial encounter for closed fracture: Secondary | ICD-10-CM | POA: Diagnosis not present

## 2018-08-30 DIAGNOSIS — S12112D Nondisplaced Type II dens fracture, subsequent encounter for fracture with routine healing: Secondary | ICD-10-CM | POA: Diagnosis not present

## 2018-08-30 MED ORDER — BUDESONIDE-FORMOTEROL FUMARATE 160-4.5 MCG/ACT IN AERO: 2.0000 | INHALATION_SPRAY | Freq: Two times a day (BID) | RESPIRATORY_TRACT | 0 refills | Status: DC

## 2018-08-30 NOTE — Progress Notes (Signed)
Name: Terri Burns   MRN: [id number]    DOB: 01-30-1951   Date:08/31/2018       Progress Note  Subjective  Chief Complaint  Chief Complaint  Patient presents with   Hospitalization Follow-up    I connected with@ on 08/31/18 at 11:00 AM EDT by a video enabled telemedicine application and verified that I am speaking with the correct person using two identifiers.  I discussed the limitations of evaluation and management by telemedicine and the availability of in person appointments. The patient expressed understanding and agreed to proceed. Pt is at home and I am in office for phone call today which is performed due to [place] pandemic.  Patient is at home with her son Terri Burns, she will came by today for labs and bp check since she is going to see Dr. Lacinda Axon this afternoon  I am at [facility] discharge follow up: Patient has gone to [facility] three times in the past 2 months. She had a contusion of right foot on 07/15/2018 , on 08/16/2018 she was seen for head injury secondary to a fall while putting her shoes on and CT cervical spine showed Type II odontoid fracture. During that [facility] visit her pulse ox was 95 % and bp was normal at 139/87 , patient also denied dizziness. She returned to [facility] on 08/20/2018 for evaluation of another fall with head laceration. During that time she reported that she was not eating because of discomfort caused by the neck collar, she was hypoxic at room air with pulse ox of 70% and diagnosed with COPD exarcebation. She was treated with steroids and antibiotics. She was discharge home with home health nurse for PT and OT. She used to live independently but is currently staying with her son Terri Burns. During her hospital stay Dr. Manuella Ghazi discussed palliative care and I spent time during her visit today offering the service and son and patient agreed on having the referral placed. Patient has been off HCTZ, and recently also off Demadex, she is still taking  metoprolol, and per Terri Burns bp has been within normal limits ( checked by home health agency ), She is due for labs and will return for that this afternoon.   Anemia : severe we will recheck labs today  COPD: she has been using neb treatment, could not afford trelegy and would like to go back on Symbicort, she still has cough and SOB but back to her baseline.   She also agreed on having pneumococcal vaccine today   Patient Active Problem List   Diagnosis Date Noted   Acute respiratory failure with hypoxemia ([place]) 08/20/2018   Skin lesion of face 05/01/2018   Atherosclerosis of aorta ([place]) 03/04/2018   Coronary artery calcification of native artery 03/04/2018   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   Chronic pain 01/01/2018   Benign essential HTN 01/01/2018   Senile purpura ([place]) 01/01/2018   Recurrent major depressive disorder, in full remission ([place]) 01/01/2018   Hyperglycemia 09/27/2016   Tobacco abuse 08/20/2015   Allergic rhinitis, seasonal 05/12/2015   Emphysema lung ([place]) 05/12/2015   Degeneration of intervertebral disc of cervical region 05/12/2015   GERD (gastroesophageal reflux disease) 05/12/2015   Major depression in remission ([place]) 05/12/2015    Past Surgical History:  Procedure Laterality Date   NECK SURGERY N/A 10/2003    Family History  Problem Relation Age of Onset   Hypertension Mother     Social History  Socioeconomic History   Marital status: Widowed    Spouse name: Not on file   Number of children: 3   Years of education: Not on file   Highest education level: Some college, no degree  Occupational History   Occupation: disabled     Comment: 2007 - DDD cervical spine  Social Needs   Financial resource strain: Hard   Food insecurity:    Worry: Sometimes true    Inability: Sometimes true   Transportation needs:    Medical: No    Non-medical: No  Tobacco Use   Smoking status: Current Every Day Smoker     Packs/day: 1.00    Years: 45.00    Pack years: 45.00    Types: Cigarettes   Smokeless tobacco: Never Used  Substance and Sexual Activity   Alcohol use: No    Alcohol/week: 0.0 standard drinks   Drug use: No   Sexual activity: Not Currently  Lifestyle   Physical activity:    Days per week: 3 days    Minutes per session: Not on file   Stress: Not at all  Relationships   Social connections:    Talks on phone: More than three times a week    Gets together: More than three times a week    Attends religious service: Never    Active member of club or organization: No    Attends meetings of clubs or organizations: Never    Relationship status: Widowed   Intimate partner violence:    Fear of current or ex partner: No    Emotionally abused: No    Physically abused: No    Forced sexual activity: No  Other Topics Concern   Not on file  Social History Narrative   Lives in a senior citizen complex and goes out with her friends     Current Outpatient Medications:    albuterol (VENTOLIN HFA) 108 (90 Base) MCG/ACT inhaler, Inhale 2 puffs into the lungs every 6 (six) hours as needed for wheezing or shortness of breath., Disp: 18 Inhaler, Rfl: 2   aspirin EC 81 MG tablet, Take 1 tablet (81 mg total) by mouth daily., Disp: 90 tablet, Rfl: 1   atorvastatin (LIPITOR) 20 MG tablet, Take 1 tablet (20 mg total) by mouth daily., Disp: 90 tablet, Rfl: 1   celecoxib (CELEBREX) 200 MG capsule, Take 1 capsule by mouth as needed., Disp: , Rfl:    citalopram (CELEXA) 20 MG tablet, Take 1 tablet (20 mg total) by mouth daily., Disp: 90 tablet, Rfl: 1   fluticasone (FLONASE) 50 MCG/ACT nasal spray, SPRAY 2 SPRAYS INTO EACH NOSTRIL EVERY DAY, Disp: 16 g, Rfl: 2   ibuprofen (ADVIL,MOTRIN) 400 MG tablet, Take 1 tablet (400 mg total) by mouth every 6 (six) hours as needed., Disp: 30 tablet, Rfl: 0   ipratropium-albuterol (DUONEB) 0.5-2.5 (3) MG/3ML SOLN, TAKE 3 MLS BY NEBULIZATION EVERY 6 (SIX)  HOURS AS NEEDED., Disp: 360 mL, Rfl: 0   loratadine (CLARITIN) 10 MG tablet, Take 1 tablet (10 mg total) by mouth daily., Disp: 90 tablet, Rfl: 1   metoprolol succinate (TOPROL-XL) 25 MG 24 hr tablet, TAKE 1 TABLET (25 MG TOTAL) BY MOUTH DAILY., Disp: 90 tablet, Rfl: 1   pantoprazole (PROTONIX) 40 MG tablet, Take 1 tablet (40 mg total) by mouth daily., Disp: 90 tablet, Rfl: 1   budesonide-formoterol (SYMBICORT) 160-4.5 MCG/ACT inhaler, Inhale 2 puffs into the lungs 2 (two) times daily., Disp: 1 Inhaler, Rfl: 0   torsemide (DEMADEX) 20  MG tablet, Take 1 tablet (20 mg total) by mouth daily. (Patient not taking: Reported on 08/30/2018), Disp: 30 tablet, Rfl: 0  Allergies  Allergen Reactions   Levofloxacin Shortness Of Breath   Penicillins Hives    itching   Meloxicam     dizziness   Nsaids Other (See Comments)    Patient states she can tolerate up to three doses per day without incident    I personally reviewed active problem list, medication list, allergies, family history, social history with the patient/caregiver today.   ROS  Constitutional: Negative for fever Respiratory: positive  for cough and shortness of breath.   Cardiovascular: Negative for chest pain or palpitations.  Gastrointestinal: Negative for abdominal pain, no bowel changes.  Musculoskeletal: Negative for gait problem or joint swelling.  Skin: Negative for rash.  Neurological: Negative for dizziness or headache.  No other specific complaints in a complete review of systems (except as listed in HPI above).  Objective   Virtual encounter, vitals not obtained.  Physical Exam  Frail, with large bandage on forehead, speaking in full sentences, awake and alert and in no distress   PHQ2/9: Depression screen [facility] 2/9 08/30/2018 01/01/2018 11/22/2017 06/29/2017 03/29/2017  Decreased Interest 1 0 1 0 0  Down, Depressed, Hopeless 0 0 0 0 0  PHQ - 2 Score 1 0 1 0 0  Altered sleeping 0 0 0 - -  Tired, decreased  energy 1 1 2  - -  Change in appetite 0 0 1 - -  Feeling bad or failure about yourself  0 0 0 - -  Trouble concentrating 0 0 0 - -  Moving slowly or fidgety/restless 0 0 0 - -  Suicidal thoughts 0 0 0 - -  PHQ-9 Score 2 1 4  - -  Difficult doing work/chores Somewhat difficult Not difficult at all Somewhat difficult - -   PHQ-2/9 Result is positive.    Fall Risk: Fall Risk  08/30/2018 05/01/2018 01/01/2018 06/29/2017 03/29/2017  Falls in the past year? 1 0 No No No  Number falls in past yr: 1 0 - - -  Injury with Fall? 1 - - - -  Risk for fall due to : History of fall(s) - - - Impaired balance/gait  Risk for fall due to: Comment - - - - staggered gait  Follow up Follow up appointment - - - -     Assessment & Plan  1. Centrilobular emphysema ([place])  - Amb Referral to Palliative Care I will send Symbicort as requested   2. History of recent fall  - Amb Referral to Palliative Care - COMPLETE METABOLIC PANEL WITH GFR  3. Hospital discharge follow-up  - COMPLETE METABOLIC PANEL WITH GFR  4. Hypokalemia  - Amb Referral to Palliative Care - COMPLETE METABOLIC PANEL WITH GFR  5. Essential hypertension  Off HCTZ  6. Recurrent major depressive disorder, in full remission ([place])  Stable on medication   7. Need for pneumococcal vaccination  - Pneumococcal conjugate vaccine 13-valent IM  8. Anemia, unspecified type  - CBC with Differential/Platelet - Iron, TIBC and Ferritin Panel  I discussed the assessment and treatment plan with the patient. The patient was provided an opportunity to ask questions and all were answered. The patient agreed with the plan and demonstrated an understanding of the instructions.  The patient was advised to call back or seek an in-person evaluation if the symptoms worsen or if the condition fails to improve as anticipated.  I provided 25  minutes of non-face-to-face time during this encounter.

## 2018-08-31 DIAGNOSIS — W19XXXD Unspecified fall, subsequent encounter: Secondary | ICD-10-CM | POA: Diagnosis not present

## 2018-08-31 DIAGNOSIS — Z7982 Long term (current) use of aspirin: Secondary | ICD-10-CM | POA: Diagnosis not present

## 2018-08-31 DIAGNOSIS — S12190D Other displaced fracture of second cervical vertebra, subsequent encounter for fracture with routine healing: Secondary | ICD-10-CM | POA: Diagnosis not present

## 2018-08-31 DIAGNOSIS — J441 Chronic obstructive pulmonary disease with (acute) exacerbation: Secondary | ICD-10-CM | POA: Diagnosis not present

## 2018-08-31 DIAGNOSIS — F17210 Nicotine dependence, cigarettes, uncomplicated: Secondary | ICD-10-CM | POA: Diagnosis not present

## 2018-08-31 DIAGNOSIS — S0181XD Laceration without foreign body of other part of head, subsequent encounter: Secondary | ICD-10-CM | POA: Diagnosis not present

## 2018-08-31 LAB — COMPLETE METABOLIC PANEL WITH GFR
AG Ratio: 1.5 (calc) (ref 1.0–2.5)
ALT: 30 U/L — ABNORMAL HIGH (ref 6–29)
AST: 33 U/L (ref 10–35)
Albumin: 3.5 g/dL — ABNORMAL LOW (ref 3.6–5.1)
Alkaline phosphatase (APISO): 58 U/L (ref 37–153)
BUN: 10 mg/dL (ref 7–25)
CHLORIDE: 107 mmol/L (ref 98–110)
CO2: 25 mmol/L (ref 20–32)
CREATININE: 0.81 mg/dL (ref 0.50–0.99)
Calcium: 8.8 mg/dL (ref 8.6–10.4)
GFR, Est African American: 88 mL/min/{1.73_m2} (ref >=60)
GFR, Est Non African American: 76 mL/min/{1.73_m2} (ref >=60)
Globulin: 2.4 g/dL (calc) (ref 1.9–3.7)
Glucose, Bld: 75 mg/dL (ref 65–99)
Potassium: 4.5 mmol/L (ref 3.5–5.3)
Sodium: 139 mmol/L (ref 135–146)
Total Bilirubin: 0.3 mg/dL (ref 0.2–1.2)
Total Protein: 5.9 g/dL — ABNORMAL LOW (ref 6.1–8.1)

## 2018-08-31 LAB — CBC WITH DIFFERENTIAL/PLATELET
Absolute Monocytes: 767 cells/uL (ref 200–950)
Basophils Absolute: 74 cells/uL (ref 0–200)
Basophils Relative: 0.7 %
Eosinophils Absolute: 200 cells/uL (ref 15–500)
Eosinophils Relative: 1.9 %
HCT: 32.8 % — ABNORMAL LOW (ref 35.0–45.0)
Hemoglobin: 10.3 g/dL — ABNORMAL LOW (ref 11.7–15.5)
Lymphs Abs: 2121 cells/uL (ref 850–3900)
MCH: 26.2 pg — ABNORMAL LOW (ref 27.0–33.0)
MCHC: 31.4 g/dL — ABNORMAL LOW (ref 32.0–36.0)
MCV: 83.5 fL (ref 80.0–100.0)
MPV: 10.2 fL (ref 7.5–12.5)
Monocytes Relative: 7.3 %
NEUTROS ABS: 7340 {cells}/uL (ref 1500–7800)
Neutrophils Relative %: 69.9 %
Platelets: 432 10*3/uL — ABNORMAL HIGH (ref 140–400)
RBC: 3.93 10*6/uL (ref 3.80–5.10)
RDW: 15 % (ref 11.0–15.0)
Total Lymphocyte: 20.2 %
WBC: 10.5 10*3/uL (ref 3.8–10.8)

## 2018-08-31 LAB — IRON,TIBC AND FERRITIN PANEL
%SAT: 10 % (calc) — ABNORMAL LOW (ref 16–45)
Ferritin: 39 ng/mL (ref 16–288)
Iron: 34 ug/dL — ABNORMAL LOW (ref 45–160)
TIBC: 325 mcg/dL (calc) (ref 250–450)

## 2018-09-03 ENCOUNTER — Telehealth: Payer: Self-pay | Admitting: Family Medicine

## 2018-09-03 DIAGNOSIS — F17210 Nicotine dependence, cigarettes, uncomplicated: Secondary | ICD-10-CM | POA: Diagnosis not present

## 2018-09-03 DIAGNOSIS — W19XXXD Unspecified fall, subsequent encounter: Secondary | ICD-10-CM | POA: Diagnosis not present

## 2018-09-03 DIAGNOSIS — S12190D Other displaced fracture of second cervical vertebra, subsequent encounter for fracture with routine healing: Secondary | ICD-10-CM | POA: Diagnosis not present

## 2018-09-03 DIAGNOSIS — S0181XD Laceration without foreign body of other part of head, subsequent encounter: Secondary | ICD-10-CM | POA: Diagnosis not present

## 2018-09-03 DIAGNOSIS — J441 Chronic obstructive pulmonary disease with (acute) exacerbation: Secondary | ICD-10-CM | POA: Diagnosis not present

## 2018-09-03 DIAGNOSIS — Z7982 Long term (current) use of aspirin: Secondary | ICD-10-CM | POA: Diagnosis not present

## 2018-09-03 NOTE — Telephone Encounter (Signed)
Copied from [place] [id number]. Topic: Quick Communication - Home Health Verbal Orders >> Sep 03, 2018  3:36 PM Luciana Axe wrote: Caller/Agency: [facility] Number: [phone]  [facility] to leave verbal on voice mail Requesting OT/PT/Skilled Nursing/Social Work/Speech Therapy: order to have stiches removed Frequency: 1x

## 2018-09-04 NOTE — Telephone Encounter (Signed)
Varney Biles from [facility] notified Dr. Ancil Boozer gave verbal orders for her to remove patient's stiches.

## 2018-09-05 DIAGNOSIS — J441 Chronic obstructive pulmonary disease with (acute) exacerbation: Secondary | ICD-10-CM | POA: Diagnosis not present

## 2018-09-05 DIAGNOSIS — Z7982 Long term (current) use of aspirin: Secondary | ICD-10-CM | POA: Diagnosis not present

## 2018-09-05 DIAGNOSIS — S0181XD Laceration without foreign body of other part of head, subsequent encounter: Secondary | ICD-10-CM | POA: Diagnosis not present

## 2018-09-05 DIAGNOSIS — S12190D Other displaced fracture of second cervical vertebra, subsequent encounter for fracture with routine healing: Secondary | ICD-10-CM | POA: Diagnosis not present

## 2018-09-05 DIAGNOSIS — W19XXXD Unspecified fall, subsequent encounter: Secondary | ICD-10-CM | POA: Diagnosis not present

## 2018-09-05 DIAGNOSIS — F17210 Nicotine dependence, cigarettes, uncomplicated: Secondary | ICD-10-CM | POA: Diagnosis not present

## 2018-09-06 ENCOUNTER — Encounter: Payer: Self-pay | Admitting: Family Medicine

## 2018-09-06 ENCOUNTER — Ambulatory Visit (INDEPENDENT_AMBULATORY_CARE_PROVIDER_SITE_OTHER): Payer: Medicare Other | Admitting: Family Medicine

## 2018-09-06 VITALS — BP 146/86 | HR 61 | Temp 99.0°F

## 2018-09-06 DIAGNOSIS — R0989 Other specified symptoms and signs involving the circulatory and respiratory systems: Secondary | ICD-10-CM

## 2018-09-06 DIAGNOSIS — J432 Centrilobular emphysema: Secondary | ICD-10-CM

## 2018-09-06 DIAGNOSIS — R05 Cough: Secondary | ICD-10-CM

## 2018-09-06 MED ORDER — FLUTICASONE-UMECLIDIN-VILANT 100-62.5-25 MCG/INH IN AEPB: 1.0000 | INHALATION_SPRAY | Freq: Every day | RESPIRATORY_TRACT | 3 refills | Status: DC

## 2018-09-06 NOTE — Progress Notes (Signed)
Name: Terri Burns   MRN: [id number]    DOB: 14-Jun-1950   Date:09/06/2018       Progress Note  Subjective  Chief Complaint  Chief Complaint  Patient presents with  . Cough    States it has been unchanged-COPD has been making her symptoms worst. Has been taking Symbicort and it was not covered.  Marland Kitchen COPD    Needs refill on her inhalers-productive cough with white mucus    I connected with@ on 09/06/18 at 11:20 AM EDT by a video enabled telemedicine application and verified that I am speaking with the correct person using two identifiers.  I discussed the limitations of evaluation and management by telemedicine and the availability of in person appointments. The patient expressed understanding and agreed to proceed. Staff also discussed with the patient that there may be a patient responsible charge related to this service. Patient Location: son's home  Provider Location: [facility] Additional Individuals present: son [name]  HPI  Received a note from [facility] today. Trixie Deis PTA visited the patient yesterday and noticed rales in lower lobes, pulse ox of 98 %, temperature of 99 and BP 146/86 , RR was 16. He was concerned because patient reported that she had a productive cough that was productive and with white and clear mucus. Today during our conversation through video chat she states her cough is stable , she is using neb treatment but has not been using Symbicort because of cost and would like to have something cheaper. She also states temperature was measured right after she had coffee and it was normal later yesterday. She has normal appetite, no lower extremity edema and denies worsening of cough or SOB    Patient Active Problem List   Diagnosis Date Noted  . Acute respiratory failure with hypoxemia ([place]) 08/20/2018  . Skin lesion of face 05/01/2018  . Atherosclerosis of aorta ([place]) 03/04/2018  . Coronary artery calcification of native artery 03/04/2018   . 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 Chronic pain 01/01/2018  . Benign essential HTN 01/01/2018  . Senile purpura ([place]) 01/01/2018  . Recurrent major depressive disorder, in full remission ([place]) 01/01/2018  . Hyperglycemia 09/27/2016  . Tobacco abuse 08/20/2015  . Allergic rhinitis, seasonal 05/12/2015  . Emphysema lung ([place]) 05/12/2015  . Degeneration of intervertebral disc of cervical region 05/12/2015  . GERD (gastroesophageal reflux disease) 05/12/2015  . Major depression in remission ([place]) 05/12/2015    Past Surgical History:  Procedure Laterality Date  . NECK SURGERY N/A 10/2003    Family History  Problem Relation Age of Onset  . Hypertension Mother     Social History   Socioeconomic History  . Marital status: Widowed    Spouse name: Not on file  . Number of children: 3  . Years of education: Not on file  . Highest education level: Some college, no degree  Occupational History  . Occupation: disabled     Comment: 2007 - DDD cervical spine  Social Needs  . Financial resource strain: Hard  . Food insecurity:    Worry: Sometimes true    Inability: Sometimes true  . Transportation needs:    Medical: No    Non-medical: No  Tobacco Use  . Smoking status: Current Every Day Smoker    Packs/day: 1.00    Years: 45.00    Pack years: 45.00    Types: Cigarettes  . Smokeless tobacco: Never Used  Substance and Sexual Activity  .  Alcohol use: No    Alcohol/week: 0.0 standard drinks  . Drug use: No  . Sexual activity: Not Currently  Lifestyle  . Physical activity:    Days per week: 3 days    Minutes per session: Not on file  . Stress: Not at all  Relationships  . Social connections:    Talks on phone: More than three times a week    Gets together: More than three times a week    Attends religious service: Never    Active member of club or organization: No    Attends meetings of clubs or organizations: Never    Relationship status: Widowed  .  Intimate partner violence:    Fear of current or ex partner: No    Emotionally abused: No    Physically abused: No    Forced sexual activity: No  Other Topics Concern  . Not on file  Social History Narrative   Lives in a senior citizen complex and goes out with her friends     Current Outpatient Medications:  .  albuterol (VENTOLIN HFA) 108 (90 Base) MCG/ACT inhaler, Inhale 2 puffs into the lungs every 6 (six) hours as needed for wheezing or shortness of breath., Disp: 18 Inhaler, Rfl: 2 .  aspirin EC 81 MG tablet, Take 1 tablet (81 mg total) by mouth daily., Disp: 90 tablet, Rfl: 1 .  atorvastatin (LIPITOR) 20 MG tablet, Take 1 tablet (20 mg total) by mouth daily., Disp: 90 tablet, Rfl: 1 .  citalopram (CELEXA) 20 MG tablet, Take 1 tablet (20 mg total) by mouth daily., Disp: 90 tablet, Rfl: 1 .  HYDROcodone-acetaminophen (NORCO/VICODIN) 5-325 MG tablet, , Disp: , Rfl:  .  ibuprofen (ADVIL,MOTRIN) 400 MG tablet, Take 1 tablet (400 mg total) by mouth every 6 (six) hours as needed., Disp: 30 tablet, Rfl: 0 .  ipratropium-albuterol (DUONEB) 0.5-2.5 (3) MG/3ML SOLN, TAKE 3 MLS BY NEBULIZATION EVERY 6 (SIX) HOURS AS NEEDED., Disp: 360 mL, Rfl: 0 .  loratadine (CLARITIN) 10 MG tablet, Take 1 tablet (10 mg total) by mouth daily., Disp: 90 tablet, Rfl: 1 .  metoprolol succinate (TOPROL-XL) 25 MG 24 hr tablet, TAKE 1 TABLET (25 MG TOTAL) BY MOUTH DAILY., Disp: 90 tablet, Rfl: 1 .  pantoprazole (PROTONIX) 40 MG tablet, Take 1 tablet (40 mg total) by mouth daily., Disp: 90 tablet, Rfl: 1 .  budesonide-formoterol (SYMBICORT) 160-4.5 MCG/ACT inhaler, Inhale 2 puffs into the lungs 2 (two) times daily. (Patient not taking: Reported on 09/06/2018), Disp: 1 Inhaler, Rfl: 0 .  celecoxib (CELEBREX) 200 MG capsule, Take 1 capsule by mouth as needed., Disp: , Rfl:  .  fluticasone (FLONASE) 50 MCG/ACT nasal spray, SPRAY 2 SPRAYS INTO EACH NOSTRIL EVERY DAY (Patient not taking: Reported on 09/06/2018), Disp: 16 g,  Rfl: 2 .  torsemide (DEMADEX) 20 MG tablet, Take 1 tablet (20 mg total) by mouth daily. (Patient not taking: Reported on 09/06/2018), Disp: 30 tablet, Rfl: 0  Allergies  Allergen Reactions  . Levofloxacin Shortness Of Breath  . Penicillins Hives    itching  . Meloxicam     dizziness  . Nsaids Other (See Comments)    Patient states she can tolerate up to three doses per day without incident    I personally reviewed active problem list, medication list, allergies, family history, social history with the patient/caregiver today.   ROS  Ten systems reviewed and is negative except as mentioned in HPI   Objective  Virtual encounter, vitals not obtained.  There is no height or weight on file to calculate BMI.  Physical Exam  Awake, alert, large dressing on forehead still present, speaking in full sentences and does not seem to be SOB   PHQ2/9: Depression screen [facility] 2/9 09/06/2018 08/30/2018 01/01/2018 11/22/2017 06/29/2017  Decreased Interest 0 1 0 1 0  Down, Depressed, Hopeless 0 0 0 0 0  PHQ - 2 Score 0 1 0 1 0  Altered sleeping 0 0 0 0 -  Tired, decreased energy 0 1 1 2  -  Change in appetite 0 0 0 1 -  Feeling bad or failure about yourself  0 0 0 0 -  Trouble concentrating 0 0 0 0 -  Moving slowly or fidgety/restless 0 0 0 0 -  Suicidal thoughts 0 0 0 0 -  PHQ-9 Score 0 2 1 4  -  Difficult doing work/chores Not difficult at all Somewhat difficult Not difficult at all Somewhat difficult -   PHQ-2/9 Result is negative.    Fall Risk: Fall Risk  08/30/2018 05/01/2018 01/01/2018 06/29/2017 03/29/2017  Falls in the past year? 1 0 No No No  Number falls in past yr: 1 0 - - -  Injury with Fall? 1 - - - -  Risk for fall due to : History of fall(s) - - - Impaired balance/gait  Risk for fall due to: Comment - - - - staggered gait  Follow up Follow up appointment - - - -     Assessment & Plan  1. Respiratory crackles at both lung bases  Advised to use demadex prn if she notices  worsening of cough or SOB  and notify us. Not to take it daily    2. Centrilobular emphysema ([place])  She cannot afford Symbicort, we will check to find out what is covered by her insurance We contacted pharmacy and Trelegy is going to cost $3.80  - Fluticasone-Umeclidin-Vilant (TRELEGY ELLIPTA) 100-62.5-25 MCG/INH AEPB; Inhale 1 puff into the lungs daily.  Dispense: 60 each; Refill: 3  I discussed the assessment and treatment plan with the patient. The patient was provided an opportunity to ask questions and all were answered. The patient agreed with the plan and demonstrated an understanding of the instructions.  The patient was advised to call back or seek an in-person evaluation if the symptoms worsen or if the condition fails to improve as anticipated.  I provided 15  minutes of non-face-to-face time during this encounter.

## 2018-09-10 DIAGNOSIS — S0181XD Laceration without foreign body of other part of head, subsequent encounter: Secondary | ICD-10-CM | POA: Diagnosis not present

## 2018-09-10 DIAGNOSIS — J441 Chronic obstructive pulmonary disease with (acute) exacerbation: Secondary | ICD-10-CM | POA: Diagnosis not present

## 2018-09-10 DIAGNOSIS — W19XXXD Unspecified fall, subsequent encounter: Secondary | ICD-10-CM | POA: Diagnosis not present

## 2018-09-10 DIAGNOSIS — Z7982 Long term (current) use of aspirin: Secondary | ICD-10-CM | POA: Diagnosis not present

## 2018-09-10 DIAGNOSIS — F17210 Nicotine dependence, cigarettes, uncomplicated: Secondary | ICD-10-CM | POA: Diagnosis not present

## 2018-09-10 DIAGNOSIS — S12190D Other displaced fracture of second cervical vertebra, subsequent encounter for fracture with routine healing: Secondary | ICD-10-CM | POA: Diagnosis not present

## 2018-09-10 NOTE — Telephone Encounter (Signed)
Libby with [facility] (Palliative Care Team) PH# [phone] calling to f/u on requesting below from 08/22/2018. She spoke to [place] requesting Dr. Ancil Boozer to follow pt for Palliative Care. Palliative Care NP will document in Epic when completing telehealth calls with pts. Please call back with verbal.

## 2018-09-10 NOTE — Telephone Encounter (Signed)
Left message for Golden Circle with [facility] to give verbal order for Palliative Care for patient.

## 2018-09-11 DIAGNOSIS — Z7982 Long term (current) use of aspirin: Secondary | ICD-10-CM | POA: Diagnosis not present

## 2018-09-11 DIAGNOSIS — S12190D Other displaced fracture of second cervical vertebra, subsequent encounter for fracture with routine healing: Secondary | ICD-10-CM | POA: Diagnosis not present

## 2018-09-11 DIAGNOSIS — S0181XD Laceration without foreign body of other part of head, subsequent encounter: Secondary | ICD-10-CM | POA: Diagnosis not present

## 2018-09-11 DIAGNOSIS — J441 Chronic obstructive pulmonary disease with (acute) exacerbation: Secondary | ICD-10-CM | POA: Diagnosis not present

## 2018-09-11 DIAGNOSIS — W19XXXD Unspecified fall, subsequent encounter: Secondary | ICD-10-CM | POA: Diagnosis not present

## 2018-09-11 DIAGNOSIS — F17210 Nicotine dependence, cigarettes, uncomplicated: Secondary | ICD-10-CM | POA: Diagnosis not present

## 2018-09-13 DIAGNOSIS — W19XXXD Unspecified fall, subsequent encounter: Secondary | ICD-10-CM | POA: Diagnosis not present

## 2018-09-13 DIAGNOSIS — S12190D Other displaced fracture of second cervical vertebra, subsequent encounter for fracture with routine healing: Secondary | ICD-10-CM | POA: Diagnosis not present

## 2018-09-13 DIAGNOSIS — S0181XD Laceration without foreign body of other part of head, subsequent encounter: Secondary | ICD-10-CM | POA: Diagnosis not present

## 2018-09-13 DIAGNOSIS — Z7982 Long term (current) use of aspirin: Secondary | ICD-10-CM | POA: Diagnosis not present

## 2018-09-13 DIAGNOSIS — J441 Chronic obstructive pulmonary disease with (acute) exacerbation: Secondary | ICD-10-CM | POA: Diagnosis not present

## 2018-09-13 DIAGNOSIS — F17210 Nicotine dependence, cigarettes, uncomplicated: Secondary | ICD-10-CM | POA: Diagnosis not present

## 2018-09-14 DIAGNOSIS — Z7982 Long term (current) use of aspirin: Secondary | ICD-10-CM | POA: Diagnosis not present

## 2018-09-14 DIAGNOSIS — S12190D Other displaced fracture of second cervical vertebra, subsequent encounter for fracture with routine healing: Secondary | ICD-10-CM | POA: Diagnosis not present

## 2018-09-14 DIAGNOSIS — J441 Chronic obstructive pulmonary disease with (acute) exacerbation: Secondary | ICD-10-CM | POA: Diagnosis not present

## 2018-09-14 DIAGNOSIS — W19XXXD Unspecified fall, subsequent encounter: Secondary | ICD-10-CM | POA: Diagnosis not present

## 2018-09-14 DIAGNOSIS — F17210 Nicotine dependence, cigarettes, uncomplicated: Secondary | ICD-10-CM | POA: Diagnosis not present

## 2018-09-14 DIAGNOSIS — S0181XD Laceration without foreign body of other part of head, subsequent encounter: Secondary | ICD-10-CM | POA: Diagnosis not present

## 2018-09-17 DIAGNOSIS — S12190D Other displaced fracture of second cervical vertebra, subsequent encounter for fracture with routine healing: Secondary | ICD-10-CM | POA: Diagnosis not present

## 2018-09-17 DIAGNOSIS — J441 Chronic obstructive pulmonary disease with (acute) exacerbation: Secondary | ICD-10-CM | POA: Diagnosis not present

## 2018-09-17 DIAGNOSIS — Z7982 Long term (current) use of aspirin: Secondary | ICD-10-CM | POA: Diagnosis not present

## 2018-09-17 DIAGNOSIS — W19XXXD Unspecified fall, subsequent encounter: Secondary | ICD-10-CM | POA: Diagnosis not present

## 2018-09-17 DIAGNOSIS — F17210 Nicotine dependence, cigarettes, uncomplicated: Secondary | ICD-10-CM | POA: Diagnosis not present

## 2018-09-17 DIAGNOSIS — S0181XD Laceration without foreign body of other part of head, subsequent encounter: Secondary | ICD-10-CM | POA: Diagnosis not present

## 2018-09-20 DIAGNOSIS — F17210 Nicotine dependence, cigarettes, uncomplicated: Secondary | ICD-10-CM | POA: Diagnosis not present

## 2018-09-20 DIAGNOSIS — S12190D Other displaced fracture of second cervical vertebra, subsequent encounter for fracture with routine healing: Secondary | ICD-10-CM | POA: Diagnosis not present

## 2018-09-20 DIAGNOSIS — J441 Chronic obstructive pulmonary disease with (acute) exacerbation: Secondary | ICD-10-CM | POA: Diagnosis not present

## 2018-09-20 DIAGNOSIS — Z7982 Long term (current) use of aspirin: Secondary | ICD-10-CM | POA: Diagnosis not present

## 2018-09-20 DIAGNOSIS — W19XXXD Unspecified fall, subsequent encounter: Secondary | ICD-10-CM | POA: Diagnosis not present

## 2018-09-20 DIAGNOSIS — S0181XD Laceration without foreign body of other part of head, subsequent encounter: Secondary | ICD-10-CM | POA: Diagnosis not present

## 2018-10-01 ENCOUNTER — Telehealth: Payer: Self-pay | Admitting: *Deleted

## 2018-10-01 ENCOUNTER — Telehealth: Payer: Self-pay

## 2018-10-01 NOTE — Telephone Encounter (Signed)
Left voicemail in attempt to schedule for lcs nodule f/u ct scan that is past due.

## 2018-10-01 NOTE — Telephone Encounter (Signed)
Libby called and finaly was able to get in touch with Pt's daughter by phone and the daughter stated that the Pt did not want to receive palliative services at this time. Golden Circle thinks it may be due to the Pt possibly having home health aid /please advise

## 2018-10-01 NOTE — Telephone Encounter (Signed)
Informed by scheduler Golden Circle) that patient's daughter declined Palliative Care services and that PCP was updated

## 2018-10-30 ENCOUNTER — Other Ambulatory Visit: Payer: Self-pay | Admitting: Family Medicine

## 2018-10-30 DIAGNOSIS — I2510 Atherosclerotic heart disease of native coronary artery without angina pectoris: Secondary | ICD-10-CM

## 2018-10-30 DIAGNOSIS — I2584 Coronary atherosclerosis due to calcified coronary lesion: Secondary | ICD-10-CM

## 2018-10-30 DIAGNOSIS — I700 Atherosclerosis of aorta: Secondary | ICD-10-CM

## 2018-10-30 DIAGNOSIS — J3089 Other allergic rhinitis: Secondary | ICD-10-CM

## 2018-10-30 DIAGNOSIS — J42 Unspecified chronic bronchitis: Secondary | ICD-10-CM

## 2018-10-31 ENCOUNTER — Other Ambulatory Visit: Payer: Self-pay | Admitting: Family Medicine

## 2018-10-31 DIAGNOSIS — K219 Gastro-esophageal reflux disease without esophagitis: Secondary | ICD-10-CM

## 2018-11-01 ENCOUNTER — Other Ambulatory Visit: Payer: Self-pay | Admitting: Student

## 2018-11-01 DIAGNOSIS — S12112D Nondisplaced Type II dens fracture, subsequent encounter for fracture with routine healing: Secondary | ICD-10-CM

## 2018-11-02 ENCOUNTER — Telehealth: Payer: Self-pay | Admitting: *Deleted

## 2018-11-02 DIAGNOSIS — Z87891 Personal history of nicotine dependence: Secondary | ICD-10-CM

## 2018-11-02 DIAGNOSIS — R918 Other nonspecific abnormal finding of lung field: Secondary | ICD-10-CM

## 2018-11-02 DIAGNOSIS — Z122 Encounter for screening for malignant neoplasm of respiratory organs: Secondary | ICD-10-CM

## 2018-11-02 NOTE — Telephone Encounter (Signed)
Patient is agreeable to schedule LCS nodule f/u ct at the same time she is to have another CT next week.

## 2018-11-05 ENCOUNTER — Other Ambulatory Visit: Payer: Self-pay | Admitting: Family Medicine

## 2018-11-05 DIAGNOSIS — J3089 Other allergic rhinitis: Secondary | ICD-10-CM

## 2018-11-05 NOTE — Telephone Encounter (Signed)
Refill request for general medication. Flonase  Last office visit 09/06/2018  Follow up on 12/11/2018

## 2018-11-08 ENCOUNTER — Other Ambulatory Visit: Payer: Self-pay

## 2018-11-08 ENCOUNTER — Ambulatory Visit
Admission: RE | Admit: 2018-11-08 | Discharge: 2018-11-08 | Disposition: A | Discharge status: Home / Self Care | Payer: Medicare Other | Source: Ambulatory Visit | Attending: Student | Admitting: Student

## 2018-11-08 DIAGNOSIS — S12112D Nondisplaced Type II dens fracture, subsequent encounter for fracture with routine healing: Secondary | ICD-10-CM

## 2018-11-08 DIAGNOSIS — Z87891 Personal history of nicotine dependence: Secondary | ICD-10-CM

## 2018-11-08 DIAGNOSIS — R918 Other nonspecific abnormal finding of lung field: Secondary | ICD-10-CM | POA: Diagnosis not present

## 2018-11-08 DIAGNOSIS — Z122 Encounter for screening for malignant neoplasm of respiratory organs: Secondary | ICD-10-CM | POA: Diagnosis not present

## 2018-11-08 DIAGNOSIS — S12120K Other displaced dens fracture, subsequent encounter for fracture with nonunion: Secondary | ICD-10-CM | POA: Diagnosis not present

## 2018-11-08 DIAGNOSIS — Z0389 Encounter for observation for other suspected diseases and conditions ruled out: Secondary | ICD-10-CM | POA: Diagnosis not present

## 2018-11-08 DIAGNOSIS — F17210 Nicotine dependence, cigarettes, uncomplicated: Secondary | ICD-10-CM | POA: Diagnosis not present

## 2018-11-14 ENCOUNTER — Telehealth: Payer: Self-pay | Admitting: *Deleted

## 2018-11-14 NOTE — Telephone Encounter (Signed)
Notified patient of LDCT lung cancer screening program results with recommendation for 6 month follow up imaging. Also notified of incidental findings noted below and is encouraged to discuss further with PCP who will receive a copy of this note and/or the CT report. Patient verbalizes understanding. Patient reports being asymptomatic for active infection but knows to contact her PCP for fever or productive cough.  IMPRESSION: Lung-RADS 3, probably benign findings. Short-term follow-up in 6 months is recommended with repeat low-dose chest CT without contrast (please use the following order, "CT CHEST LCS NODULE FOLLOW-UP W/O CM").  New ground-glass opacity with associated nodularity in the right lung, as above, favoring infection/inflammation.  Aortic Atherosclerosis (ICD10-I70.0) and Emphysema (ICD10-J43.9).

## 2018-11-15 DIAGNOSIS — S12110K Anterior displaced Type II dens fracture, subsequent encounter for fracture with nonunion: Secondary | ICD-10-CM | POA: Diagnosis not present

## 2018-11-30 ENCOUNTER — Other Ambulatory Visit: Payer: Self-pay | Admitting: Family Medicine

## 2018-11-30 DIAGNOSIS — I10 Essential (primary) hypertension: Secondary | ICD-10-CM

## 2018-12-11 ENCOUNTER — Encounter: Payer: Self-pay | Admitting: Family Medicine

## 2018-12-11 ENCOUNTER — Other Ambulatory Visit: Payer: Self-pay

## 2018-12-11 ENCOUNTER — Ambulatory Visit (INDEPENDENT_AMBULATORY_CARE_PROVIDER_SITE_OTHER): Payer: Medicare Other | Admitting: Family Medicine

## 2018-12-11 VITALS — BP 124/68 | HR 71 | Temp 96.9°F | Resp 16 | Ht 60.0 in | Wt 125.7 lb

## 2018-12-11 DIAGNOSIS — F3342 Major depressive disorder, recurrent, in full remission: Secondary | ICD-10-CM

## 2018-12-11 DIAGNOSIS — K219 Gastro-esophageal reflux disease without esophagitis: Secondary | ICD-10-CM | POA: Diagnosis not present

## 2018-12-11 DIAGNOSIS — D692 Other nonthrombocytopenic purpura: Secondary | ICD-10-CM | POA: Diagnosis not present

## 2018-12-11 DIAGNOSIS — M542 Cervicalgia: Secondary | ICD-10-CM

## 2018-12-11 DIAGNOSIS — I10 Essential (primary) hypertension: Secondary | ICD-10-CM | POA: Diagnosis not present

## 2018-12-11 DIAGNOSIS — J42 Unspecified chronic bronchitis: Secondary | ICD-10-CM

## 2018-12-11 DIAGNOSIS — Z1231 Encounter for screening mammogram for malignant neoplasm of breast: Secondary | ICD-10-CM

## 2018-12-11 DIAGNOSIS — Z13820 Encounter for screening for osteoporosis: Secondary | ICD-10-CM

## 2018-12-11 DIAGNOSIS — J432 Centrilobular emphysema: Secondary | ICD-10-CM | POA: Diagnosis not present

## 2018-12-11 DIAGNOSIS — E2839 Other primary ovarian failure: Secondary | ICD-10-CM | POA: Diagnosis not present

## 2018-12-11 DIAGNOSIS — I700 Atherosclerosis of aorta: Secondary | ICD-10-CM

## 2018-12-11 DIAGNOSIS — Z1211 Encounter for screening for malignant neoplasm of colon: Secondary | ICD-10-CM | POA: Diagnosis not present

## 2018-12-11 DIAGNOSIS — G8929 Other chronic pain: Secondary | ICD-10-CM

## 2018-12-11 DIAGNOSIS — I2584 Coronary atherosclerosis due to calcified coronary lesion: Secondary | ICD-10-CM

## 2018-12-11 DIAGNOSIS — I2510 Atherosclerotic heart disease of native coronary artery without angina pectoris: Secondary | ICD-10-CM | POA: Diagnosis not present

## 2018-12-11 MED ORDER — METOPROLOL SUCCINATE ER 25 MG PO TB24: ORAL_TABLET | ORAL | 0 refills | Status: DC

## 2018-12-11 MED ORDER — CITALOPRAM HYDROBROMIDE 20 MG PO TABS: 20.0000 mg | ORAL_TABLET | Freq: Every day | ORAL | 1 refills | Status: DC

## 2018-12-11 MED ORDER — IPRATROPIUM-ALBUTEROL 0.5-2.5 (3) MG/3ML IN SOLN: 3.0000 mL | Freq: Four times a day (QID) | RESPIRATORY_TRACT | 1 refills | Status: DC | PRN

## 2018-12-11 MED ORDER — PANTOPRAZOLE SODIUM 40 MG PO TBEC: 40.0000 mg | DELAYED_RELEASE_TABLET | Freq: Every day | ORAL | 0 refills | Status: DC

## 2018-12-11 MED ORDER — ATORVASTATIN CALCIUM 20 MG PO TABS: 20.0000 mg | ORAL_TABLET | Freq: Every day | ORAL | 0 refills | Status: DC

## 2018-12-11 NOTE — Progress Notes (Signed)
Name: Terri Burns   MRN: [id number]    DOB: 1951-04-04   Date:12/11/2018       Progress Note  Subjective  Chief Complaint  Chief Complaint  Patient presents with  . Medication Refill    has to meet her deductible and unable to afford her inhaler  . Depression  . Hypertension    Denies any symptoms  . COPD    Heat causes her to have short of breath. Unable to afford inhaler.   . Gastroesophageal Reflux  . Allergic Rhinitis   . HPV positive  . Grieving    Daughter recently died at the age of 37. She died 2 months ago    HPI  Chronic neck pain: she has been on disability since 2007, she was going to [facility] now, had x-ray and MRI done, she has DDD.  She fell March 2020 and fracture her c-spine at C2 level, she went to [facility] just EC, but she fell down again a few days later and had to be admitted because of severe hypotension , hypoxia and had COPD exacerbation. She continues to have neck pain, wearing a neck color, currently going to [facility] and they have discussed surgery however afraid of possible complications. She still needs some assistance at home, and her son's have been helpful  HPV positive: explained that she needs to return for pap smear - however because of neck problems she would like to hold off on that   HTN: she is only on metoprolol now, off losartan and hctz, bp today is at goal, she states also normal most of the time at home, only goes up when in severe pain. No chest pain or palpitation   COPD : CT showed emphysema, she had a flare March 2020.Marland Kitchen She denies SOB, however difficulty breathing when outside in the heat, no wheezing or cough at this time . She is on Trelegy but is too expensive because of high deductible - she is out of medication at this time because cannot afford it,  and uses prn duoneb but also very expensive, we will try sending to [place] instead of pharmacy .  Marland Kitchen  Major depression: she was in remission, taking citalopram, however lost  her daughter May 2020 and is grieving, she is now smoking again because of stress, having episodes of crying spells. Discussed hospice care   Atherosclerosis aorta and coronary vessels, seen on CT chest : discussed results with patient she is willing to take aspirin and atorvastatin . Unchanged   Patient Active Problem List   Diagnosis Date Noted  . Acute respiratory failure with hypoxemia ([place]) 08/20/2018  . Skin lesion of face 05/01/2018  . Atherosclerosis of aorta ([place]) 03/04/2018  . Coronary artery calcification of native artery 03/04/2018  . 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 Chronic pain 01/01/2018  . Benign essential HTN 01/01/2018  . Senile purpura ([place]) 01/01/2018  . Recurrent major depressive disorder, in full remission ([place]) 01/01/2018  . Hyperglycemia 09/27/2016  . Tobacco abuse 08/20/2015  . Allergic rhinitis, seasonal 05/12/2015  . Emphysema lung ([place]) 05/12/2015  . Degeneration of intervertebral disc of cervical region 05/12/2015  . GERD (gastroesophageal reflux disease) 05/12/2015  . Major depression in remission ([place]) 05/12/2015    Past Surgical History:  Procedure Laterality Date  . NECK SURGERY N/A 10/2003  . SHOULDER SURGERY Left 2004    Family History  Problem Relation Age of Onset  . Hypertension Mother   . Stroke Mother   .  Heart disease Father   . Diabetes Father   . Heart disease Daughter   . Pulmonary embolism Daughter   . Hypertension Son     Social History   Socioeconomic History  . Marital status: Widowed    Spouse name: Not on file  . Number of children: 3  . Years of education: Not on file  . Highest education level: Some college, no degree  Occupational History  . Occupation: disabled     Comment: 2007 - DDD cervical spine  Social Needs  . Financial resource strain: Hard  . Food insecurity    Worry: Sometimes true    Inability: Sometimes true  . Transportation needs    Medical: No    Non-medical: No   Tobacco Use  . Smoking status: Current Every Day Smoker    Packs/day: 1.00    Years: 45.00    Pack years: 45.00    Types: Cigarettes    Start date: 12/10/1973  . Smokeless tobacco: Never Used  Substance and Sexual Activity  . Alcohol use: No    Alcohol/week: 0.0 standard drinks  . Drug use: No  . Sexual activity: Not Currently  Lifestyle  . Physical activity    Days per week: 0 days    Minutes per session: 0 min  . Stress: Rather much  Relationships  . Social connections    Talks on phone: More than three times a week    Gets together: More than three times a week    Attends religious service: Never    Active member of club or organization: No    Attends meetings of clubs or organizations: Never    Relationship status: Widowed  . Intimate partner violence    Fear of current or ex partner: No    Emotionally abused: No    Physically abused: No    Forced sexual activity: No  Other Topics Concern  . Not on file  Social History Narrative   Lives in a senior citizen complex and goes out with her friends.      Her daughter recently passed away due to heart issues on Oct 11, 2018 at the age of 10.     Current Outpatient Medications:  .  albuterol (VENTOLIN HFA) 108 (90 Base) MCG/ACT inhaler, Inhale 2 puffs into the lungs every 6 (six) hours as needed for wheezing or shortness of breath., Disp: 18 Inhaler, Rfl: 2 .  aspirin 81 MG EC tablet, TAKE 1 TABLET BY MOUTH EVERY DAY, Disp: 90 tablet, Rfl: 0 .  atorvastatin (LIPITOR) 20 MG tablet, TAKE 1 TABLET BY MOUTH EVERY DAY, Disp: 90 tablet, Rfl: 0 .  celecoxib (CELEBREX) 200 MG capsule, Take 1 capsule by mouth as needed., Disp: , Rfl:  .  citalopram (CELEXA) 20 MG tablet, Take 1 tablet (20 mg total) by mouth daily., Disp: 90 tablet, Rfl: 1 .  fluticasone (FLONASE) 50 MCG/ACT nasal spray, SPRAY 2 SPRAYS INTO EACH NOSTRIL EVERY DAY, Disp: 16 g, Rfl: 0 .  HYDROcodone-acetaminophen (NORCO/VICODIN) 5-325 MG tablet, , Disp: , Rfl:  .   ibuprofen (ADVIL,MOTRIN) 400 MG tablet, Take 1 tablet (400 mg total) by mouth every 6 (six) hours as needed., Disp: 30 tablet, Rfl: 0 .  ipratropium-albuterol (DUONEB) 0.5-2.5 (3) MG/3ML SOLN, TAKE 3 MLS BY NEBULIZATION EVERY 6 (SIX) HOURS AS NEEDED, Disp: 360 mL, Rfl: 0 .  loratadine (CLARITIN) 10 MG tablet, TAKE 1 TABLET BY MOUTH EVERY DAY, Disp: 90 tablet, Rfl: 0 .  metoprolol succinate (TOPROL-XL) 25  MG 24 hr tablet, TAKE 1 TABLET BY MOUTH EVERY DAY, Disp: 90 tablet, Rfl: 0 .  pantoprazole (PROTONIX) 40 MG tablet, TAKE 1 TABLET BY MOUTH EVERY DAY, Disp: 90 tablet, Rfl: 0 .  torsemide (DEMADEX) 20 MG tablet, Take 1 tablet (20 mg total) by mouth daily., Disp: 30 tablet, Rfl: 0 .  Fluticasone-Umeclidin-Vilant (TRELEGY ELLIPTA) 100-62.5-25 MCG/INH AEPB, Inhale 1 puff into the lungs daily. (Patient not taking: Reported on 12/11/2018), Disp: 60 each, Rfl: 3  Allergies  Allergen Reactions  . Levofloxacin Shortness Of Breath  . Penicillins Hives    itching  . Meloxicam     dizziness  . Nsaids Other (See Comments)    Patient states she can tolerate up to three doses per day without incident    I personally reviewed active problem list, medication list, allergies, family history with the patient/caregiver today.   ROS  Ten systems reviewed and is negative except as mentioned in HPI   Objective  Vitals:   12/11/18 1126  BP: 124/68  Pulse: 71  Resp: 16  Temp: (!) 96.9 F (36.1 C)  TempSrc: Temporal  SpO2: 98%  Weight: 125 lb 11.2 oz (57 kg)  Height: 5' (1.524 m)    Body mass index is 24.55 kg/m.  Physical Exam  Constitutional: Patient appears well-developed and well-nourished. No distress. Wearing a cervical soft colar HEENT: head atraumatic, normocephalic, pupils equal and reactive to light Cardiovascular: Normal rate, regular rhythm and normal heart sounds.  No murmur heard. No BLE edema. Pulmonary/Chest: Effort normal and breath sounds normal. No respiratory  distress. Abdominal: Soft.  There is no tenderness. Psychiatric: Patient has a depressed mood,  behavior is normal. Judgment and thought content normal.   PHQ2/9: Depression screen [facility] 2/9 12/11/2018 09/06/2018 08/30/2018 01/01/2018 11/22/2017  Decreased Interest 1 0 1 0 1  Down, Depressed, Hopeless 1 0 0 0 0  PHQ - 2 Score 2 0 1 0 1  Altered sleeping 0 0 0 0 0  Tired, decreased energy 1 0 1 1 2   Change in appetite 0 0 0 0 1  Feeling bad or failure about yourself  0 0 0 0 0  Trouble concentrating 0 0 0 0 0  Moving slowly or fidgety/restless 0 0 0 0 0  Suicidal thoughts 0 0 0 0 0  PHQ-9 Score 3 0 2 1 4   Difficult doing work/chores Not difficult at all Not difficult at all Somewhat difficult Not difficult at all Somewhat difficult    phq 9 is positive   Fall Risk: Fall Risk  12/11/2018 08/30/2018 05/01/2018 01/01/2018 06/29/2017  Falls in the past year? 1 1 0 No No  Number falls in past yr: 1 1 0 - -  Injury with Fall? 1 1 - - -  Comment Broken Neck and put a hole in her head - - - -  Risk for fall due to : Impaired balance/gait;Other (Comment);Medication side effect History of fall(s) - - -  Risk for fall due to: Comment Dizzy and BP was too low - - - -  Follow up - Follow up appointment - - -     Functional Status Survey: Is the patient deaf or have difficulty hearing?: No Does the patient have difficulty seeing, even when wearing glasses/contacts?: No Does the patient have difficulty concentrating, remembering, or making decisions?: No Does the patient have difficulty walking or climbing stairs?: Yes Does the patient have difficulty dressing or bathing?: No Does the patient have difficulty doing errands alone  such as visiting a doctor's office or shopping?: Yes(Not able to drive due to neck brace)    Assessment & Plan  1. Atherosclerosis of aorta (HCC)  - atorvastatin (LIPITOR) 20 MG tablet; Take 1 tablet (20 mg total) by mouth daily.  Dispense: 90 tablet; Refill: 0  2. Colon  cancer screening  - Cologuard  3. Osteoporosis screening  - DG Bone Density; Future  4. Ovarian failure  - DG Bone Density; Future  5. Essential hypertension  - metoprolol succinate (TOPROL-XL) 25 MG 24 hr tablet; TAKE 1 TABLET BY MOUTH EVERY DAY  Dispense: 90 tablet; Refill: 0  6. Chronic neck pain   7. Senile purpura ([place])   8. Centrilobular emphysema ([place])  - Ambulatory referral to Chronic Care Management Services  9. Recurrent major depressive disorder, in full remission ([place])  - citalopram (CELEXA) 20 MG tablet; Take 1 tablet (20 mg total) by mouth daily.  Dispense: 90 tablet; Refill: 1  10. Coronary artery calcification of native artery  - atorvastatin (LIPITOR) 20 MG tablet; Take 1 tablet (20 mg total) by mouth daily.  Dispense: 90 tablet; Refill: 0  11. Chronic bronchitis, unspecified chronic bronchitis type ([place])  - ipratropium-albuterol (DUONEB) 0.5-2.5 (3) MG/3ML SOLN; Take 3 mLs by nebulization every 6 (six) hours as needed.  Dispense: 360 mL; Refill: 1  12. Gastroesophageal reflux disease, esophagitis presence not specified  - pantoprazole (PROTONIX) 40 MG tablet; Take 1 tablet (40 mg total) by mouth daily.  Dispense: 90 tablet; Refill: 0  13. Encounter for screening mammogram for breast cancer  - MM Digital Screening; Future

## 2018-12-19 ENCOUNTER — Telehealth: Payer: Self-pay | Admitting: Family Medicine

## 2018-12-19 NOTE — Telephone Encounter (Signed)
Did you contact pharmacy with this information. Please contact pharmacy.

## 2018-12-19 NOTE — Telephone Encounter (Signed)
A PA was done online via CoverMyMeds and it was denied on 11/02/2018. It was attempted again on 11/26/2018 & 12/13/2018.  Terri Burns (Key: [id number]) - [id number] Ipratropium-Albuterol 0.5-2.5 (3)MG/3ML solution Status: PA Response - Denied Created: May 26th, 2020 [phone] Sent: May 27th, 2020 Open  Archived - Outcome: Denied  Terri Burns (Key: [id number]) Ipratropium-Albuterol 0.5-2.5 (3)MG/3ML solution Status: Question Response - N/A Created: June 22nd, 2020 [phone] Open  Archived - Outcome: N/A  Terri Burns (Key: [id number]) Ipratropium-Albuterol 0.5-2.5 (3)MG/3ML solution Status: Question Response - N/A Created: July 9th, 2020 [phone] Open  Archived - Outcome: N/A   Supposedly the patient is to receive a letter in the mail explaining why it was denied and the instructions on how to file an appeal.

## 2018-12-19 NOTE — Telephone Encounter (Signed)
Pt pharmacy stated they have attempted contact with office with no response. Pt needs a PA for her albuterol solution / please advise     [id number] [address], [place] (Phone) [phone] (Fax)

## 2019-01-14 ENCOUNTER — Other Ambulatory Visit: Payer: Self-pay | Admitting: Family Medicine

## 2019-01-14 DIAGNOSIS — J42 Unspecified chronic bronchitis: Secondary | ICD-10-CM

## 2019-01-21 ENCOUNTER — Other Ambulatory Visit: Payer: Self-pay | Admitting: Family Medicine

## 2019-01-21 DIAGNOSIS — J3089 Other allergic rhinitis: Secondary | ICD-10-CM

## 2019-01-21 DIAGNOSIS — I700 Atherosclerosis of aorta: Secondary | ICD-10-CM

## 2019-01-21 DIAGNOSIS — I2510 Atherosclerotic heart disease of native coronary artery without angina pectoris: Secondary | ICD-10-CM

## 2019-02-21 ENCOUNTER — Other Ambulatory Visit: Payer: Self-pay | Admitting: Family Medicine

## 2019-02-21 DIAGNOSIS — J42 Unspecified chronic bronchitis: Secondary | ICD-10-CM

## 2019-03-13 ENCOUNTER — Encounter: Payer: Self-pay | Admitting: Family Medicine

## 2019-03-13 ENCOUNTER — Other Ambulatory Visit: Payer: Self-pay

## 2019-03-13 ENCOUNTER — Ambulatory Visit (INDEPENDENT_AMBULATORY_CARE_PROVIDER_SITE_OTHER): Payer: Medicare Other | Admitting: Family Medicine

## 2019-03-13 VITALS — BP 152/78 | HR 74 | Temp 96.8°F | Resp 16 | Ht 60.0 in | Wt 122.2 lb

## 2019-03-13 DIAGNOSIS — F330 Major depressive disorder, recurrent, mild: Secondary | ICD-10-CM

## 2019-03-13 DIAGNOSIS — I2584 Coronary atherosclerosis due to calcified coronary lesion: Secondary | ICD-10-CM

## 2019-03-13 DIAGNOSIS — Z23 Encounter for immunization: Secondary | ICD-10-CM

## 2019-03-13 DIAGNOSIS — D692 Other nonthrombocytopenic purpura: Secondary | ICD-10-CM

## 2019-03-13 DIAGNOSIS — D649 Anemia, unspecified: Secondary | ICD-10-CM | POA: Diagnosis not present

## 2019-03-13 DIAGNOSIS — I2510 Atherosclerotic heart disease of native coronary artery without angina pectoris: Secondary | ICD-10-CM | POA: Diagnosis not present

## 2019-03-13 DIAGNOSIS — J432 Centrilobular emphysema: Secondary | ICD-10-CM

## 2019-03-13 DIAGNOSIS — I700 Atherosclerosis of aorta: Secondary | ICD-10-CM | POA: Diagnosis not present

## 2019-03-13 NOTE — Progress Notes (Signed)
Name: Terri Burns   MRN: [id number]    DOB: 1950/10/29   Date:03/13/2019       Progress Note  Subjective  Chief Complaint  Chief Complaint  Patient presents with  . Medication Refill    3 month F/U  . Depression  . Hypertension    Denies any symptoms  . COPD    Trilegy worked well but unable to afford it   . Gastroesophageal Reflux    Protonix Medication is working great for her GERD  . Allergic Rhinitis   . Grieving  . HPV Positive  . Chronic neck pain    HPI  Chronic neck pain:she has been on disability since 2007,she was going to [facility] now, had x-ray andMRI done, she has DDD.She fell March 2020 and fracture her c-spine at C2 level, she went to [facility] just EC, but she fell down again a few days later and had to be admitted because of severe hypotension , hypoxia and had COPD exacerbation. She continues to have constant  neck pain, wearing a neck color, currently going to [facility] and they have discussed surgery however afraid of possible complications. She is mostly independent now, her son helps with some cleaning. Recently dose changed from TID to BID pain medication and she is pain right now   HPV positive: explained that she needs to return for pap smear- however because of neck problems she would like to hold off on that . Unchanged   HTN: she is only on metoprolol now, off losartan and hctz, bp not being checked at home recently, she will check it at home after taking medication and let me know, if staying above 140/80 we will resume losartan   COPD : CT showed emphysema, she had a flare March 2020.Marland Kitchen She denies SOB, she has daily coughing, unable to afford Trelegy and has been using neb solution 4 times a day and seems to help    Major depression: she was in remission, taking citalopram, however lost her daughter May 2020 and is grieving, she is now smoking again because of stress, having episodes of crying spells. Discussed hospice care    Atherosclerosis aorta and coronary vessels, seen on CT chest : discussed results with patient she is taking  aspirin and atorvastatin , we will recheck labs today    Patient Active Problem List   Diagnosis Date Noted  . Acute respiratory failure with hypoxemia ([place]) 08/20/2018  . Skin lesion of face 05/01/2018  . Atherosclerosis of aorta ([place]) 03/04/2018  . Coronary artery calcification of native artery 03/04/2018  . 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 Chronic pain 01/01/2018  . Benign essential HTN 01/01/2018  . Senile purpura ([place]) 01/01/2018  . Recurrent major depressive disorder, in full remission ([place]) 01/01/2018  . Hyperglycemia 09/27/2016  . Tobacco abuse 08/20/2015  . Allergic rhinitis, seasonal 05/12/2015  . Emphysema lung ([place]) 05/12/2015  . Degeneration of intervertebral disc of cervical region 05/12/2015  . GERD (gastroesophageal reflux disease) 05/12/2015  . Major depression in remission ([place]) 05/12/2015    Past Surgical History:  Procedure Laterality Date  . NECK SURGERY N/A 10/2003  . SHOULDER SURGERY Left 2004    Family History  Problem Relation Age of Onset  . Hypertension Mother   . Stroke Mother   . Heart disease Father   . Diabetes Father   . Heart disease Daughter   . Pulmonary embolism Daughter   . Hypertension Son     Social History  Socioeconomic History  . Marital status: Widowed    Spouse name: Not on file  . Number of children: 3  . Years of education: Not on file  . Highest education level: Some college, no degree  Occupational History  . Occupation: disabled     Comment: 2007 - DDD cervical spine  Social Needs  . Financial resource strain: Hard  . Food insecurity    Worry: Sometimes true    Inability: Sometimes true  . Transportation needs    Medical: No    Non-medical: No  Tobacco Use  . Smoking status: Current Every Day Smoker    Packs/day: 1.00    Years: 45.00    Pack years: 45.00    Types:  Cigarettes    Start date: 12/10/1973  . Smokeless tobacco: Never Used  . Tobacco comment: but smoking at most one half pack   Substance and Sexual Activity  . Alcohol use: Yes    Alcohol/week: 0.0 standard drinks    Comment: occasionally  . Drug use: No  . Sexual activity: Not Currently    Partners: Male  Lifestyle  . Physical activity    Days per week: 0 days    Minutes per session: 0 min  . Stress: Rather much  Relationships  . Social connections    Talks on phone: More than three times a week    Gets together: More than three times a week    Attends religious service: Never    Active member of club or organization: No    Attends meetings of clubs or organizations: Never    Relationship status: Widowed  . Intimate partner violence    Fear of current or ex partner: No    Emotionally abused: No    Physically abused: No    Forced sexual activity: No  Other Topics Concern  . Not on file  Social History Narrative   Lives in a senior citizen complex and goes out with her friends.   Her daughterdied  due to heart issues on Oct 11, 2018 at the age of 55.   Both sons live in town and are helping her out      Current Outpatient Medications:  .  albuterol (VENTOLIN HFA) 108 (90 Base) MCG/ACT inhaler, Inhale 2 puffs into the lungs every 6 (six) hours as needed for wheezing or shortness of breath., Disp: 18 Inhaler, Rfl: 2 .  aspirin 81 MG EC tablet, TAKE 1 TABLET BY MOUTH EVERY DAY, Disp: 90 tablet, Rfl: 0 .  atorvastatin (LIPITOR) 20 MG tablet, Take 1 tablet (20 mg total) by mouth daily., Disp: 90 tablet, Rfl: 0 .  citalopram (CELEXA) 20 MG tablet, Take 1 tablet (20 mg total) by mouth daily., Disp: 90 tablet, Rfl: 1 .  fluticasone (FLONASE) 50 MCG/ACT nasal spray, SPRAY 2 SPRAYS INTO EACH NOSTRIL EVERY DAY, Disp: 16 g, Rfl: 0 .  HYDROcodone-acetaminophen (NORCO/VICODIN) 5-325 MG tablet, , Disp: , Rfl:  .  ibuprofen (ADVIL,MOTRIN) 400 MG tablet, Take 1 tablet (400 mg total) by mouth  every 6 (six) hours as needed., Disp: 30 tablet, Rfl: 0 .  ipratropium-albuterol (DUONEB) 0.5-2.5 (3) MG/3ML SOLN, TAKE 3MLS BY NEBULIZER EVERY 6 HOURS AS NEEDED **NEED MEDICARE PART B INFO**, Disp: 360 mL, Rfl: 0 .  loratadine (CLARITIN) 10 MG tablet, TAKE 1 TABLET BY MOUTH EVERY DAY, Disp: 90 tablet, Rfl: 0 .  metoprolol succinate (TOPROL-XL) 25 MG 24 hr tablet, TAKE 1 TABLET BY MOUTH EVERY DAY, Disp: 90 tablet, Rfl: 0 .  pantoprazole (PROTONIX) 40 MG tablet, Take 1 tablet (40 mg total) by mouth daily., Disp: 90 tablet, Rfl: 0 .  torsemide (DEMADEX) 20 MG tablet, Take 1 tablet (20 mg total) by mouth daily., Disp: 30 tablet, Rfl: 0 .  celecoxib (CELEBREX) 200 MG capsule, Take 1 capsule by mouth as needed., Disp: , Rfl:   Allergies  Allergen Reactions  . Levofloxacin Shortness Of Breath  . Penicillins Hives    itching  . Meloxicam     dizziness  . Nsaids Other (See Comments)    Patient states she can tolerate up to three doses per day without incident    I personally reviewed active problem list, medication list, allergies, family history, social history, health maintenance with the patient/caregiver today.   ROS  Constitutional: Negative for fever or weight change.  Respiratory: Negative for cough and shortness of breath.   Cardiovascular: Negative for chest pain or palpitations.  Gastrointestinal: Negative for abdominal pain, no bowel changes.  Musculoskeletal: Negative for gait problem or joint swelling.  Skin: Negative for rash.  Neurological: Negative for dizziness or headache.  No other specific complaints in a complete review of systems (except as listed in HPI above).  Objective  Vitals:   03/13/19 1126  BP: (!) 152/78  Pulse: 74  Resp: 16  Temp: (!) 96.8 F (36 C)  TempSrc: Temporal  SpO2: 94%  Weight: 122 lb 3.2 oz (55.4 kg)  Height: 5' (1.524 m)    Body mass index is 23.87 kg/m.  Physical Exam  Constitutional: Patient appears well-developed and petite  No distress.  HEENT: head atraumatic, normocephalic, pupils equal and reactive to light, wearing a neck brace  Cardiovascular: Normal rate, regular rhythm and normal heart sounds.  No murmur heard. No BLE edema. Pulmonary/Chest: Effort normal , she has rhonchi anteriorly, no wheezing or crackles . No respiratory distress. Abdominal: Soft.  There is no tenderness. Psychiatric: Patient has a normal mood and affect. behavior is normal. Judgment and thought content normal. Skin: senile purpura on both arms   PHQ2/9: Depression screen [facility] 2/9 03/13/2019 12/11/2018 09/06/2018 08/30/2018 01/01/2018  Decreased Interest 1 1 0 1 0  Down, Depressed, Hopeless 0 1 0 0 0  PHQ - 2 Score 1 2 0 1 0  Altered sleeping 1 0 0 0 0  Tired, decreased energy 1 1 0 1 1  Change in appetite 0 0 0 0 0  Feeling bad or failure about yourself  0 0 0 0 0  Trouble concentrating 0 0 0 0 0  Moving slowly or fidgety/restless 0 0 0 0 0  Suicidal thoughts 0 0 0 0 0  PHQ-9 Score 3 3 0 2 1  Difficult doing work/chores Somewhat difficult Not difficult at all Not difficult at all Somewhat difficult Not difficult at all    phq 9 is positive   Fall Risk: Fall Risk  03/13/2019 12/11/2018 08/30/2018 05/01/2018 01/01/2018  Falls in the past year? 1 1 1  0 No  Number falls in past yr: 1 1 1  0 -  Injury with Fall? 1 1 1  - -  Comment Broke a vertebrae in her neck C2 Broken Neck and put a hole in her head - - -  Risk for fall due to : History of fall(s);Impaired balance/gait Impaired balance/gait;Other (Comment);Medication side effect History of fall(s) - -  Risk for fall due to: Comment - Dizzy and BP was too low - - -  Follow up - - Follow up appointment - -  Functional Status Survey: Is the patient deaf or have difficulty hearing?: No Does the patient have difficulty seeing, even when wearing glasses/contacts?: No Does the patient have difficulty concentrating, remembering, or making decisions?: No Does the patient have difficulty  walking or climbing stairs?: Yes Does the patient have difficulty dressing or bathing?: No Does the patient have difficulty doing errands alone such as visiting a doctor's office or shopping?: Yes(Not able to drive)    Assessment & Plan  1. Centrilobular emphysema (HCC)  - COMPLETE METABOLIC PANEL WITH GFR - CBC with Differential/Platelet  2. Need for immunization against influenza  - Flu Vaccine QUAD High Dose(Fluad)  3.Senile Purpura   Reassurance for now  4. Coronary artery calcification of native artery  - Lipid panel - COMPLETE METABOLIC PANEL WITH GFR  5. Recurrent major depressive disorder, Mild  ([place])  Discussed hospice counseling again   6. Anemia, unspecified type  - Ambulatory referral to Chronic Care Management Services - CBC with Differential/Platelet  7. Atherosclerosis of aorta (HCC)  On statin and aspirin

## 2019-03-14 LAB — COMPLETE METABOLIC PANEL WITH GFR
AG Ratio: 1.5 (calc) (ref 1.0–2.5)
ALT: 24 U/L (ref 6–29)
AST: 28 U/L (ref 10–35)
Albumin: 4.1 g/dL (ref 3.6–5.1)
Alkaline phosphatase (APISO): 75 U/L (ref 37–153)
BUN: 12 mg/dL (ref 7–25)
CO2: 27 mmol/L (ref 20–32)
Calcium: 9 mg/dL (ref 8.6–10.4)
Chloride: 105 mmol/L (ref 98–110)
Creat: 0.66 mg/dL (ref 0.50–0.99)
GFR, Est African American: 106 mL/min/{1.73_m2} (ref >=60)
GFR, Est Non African American: 91 mL/min/{1.73_m2} (ref >=60)
Globulin: 2.7 g/dL (calc) (ref 1.9–3.7)
Glucose, Bld: 95 mg/dL (ref 65–99)
Potassium: 4.6 mmol/L (ref 3.5–5.3)
Sodium: 137 mmol/L (ref 135–146)
Total Bilirubin: 0.4 mg/dL (ref 0.2–1.2)
Total Protein: 6.8 g/dL (ref 6.1–8.1)

## 2019-03-14 LAB — LIPID PANEL
Cholesterol: 142 mg/dL (ref <200)
HDL: 84 mg/dL (ref >=50)
LDL Cholesterol (Calc): 47 mg/dL (calc)
Non-HDL Cholesterol (Calc): 58 mg/dL (calc) (ref <130)
Total CHOL/HDL Ratio: 1.7 (calc) (ref <5.0)
Triglycerides: 37 mg/dL (ref <150)

## 2019-03-14 LAB — CBC WITH DIFFERENTIAL/PLATELET
Absolute Monocytes: 577 cells/uL (ref 200–950)
Basophils Absolute: 84 cells/uL (ref 0–200)
Basophils Relative: 0.9 %
Eosinophils Absolute: 326 cells/uL (ref 15–500)
Eosinophils Relative: 3.5 %
HCT: 36.8 % (ref 35.0–45.0)
Hemoglobin: 11.6 g/dL — ABNORMAL LOW (ref 11.7–15.5)
Lymphs Abs: 2083 cells/uL (ref 850–3900)
MCH: 25.7 pg — ABNORMAL LOW (ref 27.0–33.0)
MCHC: 31.5 g/dL — ABNORMAL LOW (ref 32.0–36.0)
MCV: 81.6 fL (ref 80.0–100.0)
MPV: 10.2 fL (ref 7.5–12.5)
Monocytes Relative: 6.2 %
Neutro Abs: 6231 cells/uL (ref 1500–7800)
Neutrophils Relative %: 67 %
Platelets: 352 10*3/uL (ref 140–400)
RBC: 4.51 10*6/uL (ref 3.80–5.10)
RDW: 16 % — ABNORMAL HIGH (ref 11.0–15.0)
Total Lymphocyte: 22.4 %
WBC: 9.3 10*3/uL (ref 3.8–10.8)

## 2019-03-15 ENCOUNTER — Ambulatory Visit: Payer: Self-pay | Admitting: Pharmacist

## 2019-03-15 NOTE — Chronic Care Management (AMB) (Signed)
  Chronic Care Management   Note  03/15/2019 Name: Terri Burns MRN: [id number] DOB: 1950-12-09  Terri Burns is a 69 y.o. year old female who sees Steele Sizer, MD for primary care. Dr. Ancil Boozer asked the CCM team to consult the patient for assistance with chronic disease management related to medication assistance- Trellegy. Telephone outreach to patient today to introduce CCM services.   Plan: Patient agreed to services and verbal consent obtained. I have scheduled an appointment for Terri Burns next week.    Terri Burns was given information about Chronic Care Management services today including:  1. CCM service includes personalized support from designated clinical staff supervised by her physician, including individualized plan of care and coordination with other care providers 2. 24/7 contact phone numbers for assistance for urgent and routine care needs. 3. Service will only be billed when office clinical staff spend 20 minutes or more in a month to coordinate care. 4. Only one practitioner may furnish and bill the service in a calendar month. 5. The patient may stop CCM services at any time (effective at the end of the month) by phone call to the office staff. 6. The patient will be responsible for cost sharing (co-pay) of up to 20% of the service fee (after annual deductible is met).  Patient agreed to services and verbal consent obtained.    Follow up plan: Telephone encounter scheduled for Wed Oct 14     Ruben Reason, PharmD Clinical Pharmacist [facility] [employer] [phone]

## 2019-03-20 ENCOUNTER — Encounter: Payer: Self-pay | Admitting: Pharmacist

## 2019-03-20 ENCOUNTER — Ambulatory Visit (INDEPENDENT_AMBULATORY_CARE_PROVIDER_SITE_OTHER): Payer: Medicare Other | Admitting: Pharmacist

## 2019-03-20 DIAGNOSIS — J432 Centrilobular emphysema: Secondary | ICD-10-CM | POA: Diagnosis not present

## 2019-03-20 NOTE — Progress Notes (Signed)
This encounter was created in error - please disregard.

## 2019-03-21 NOTE — Patient Instructions (Signed)
Goals Addressed            This Visit's Progress   . Medication cost- Trelegy (pt-stated)       Current Barriers:  . financial  Pharmacist Clinical Goal(s): Over the next 14 days, [name] will provide the necessary supplementary documents (proof of out of pocket prescription expenditure, proof of household income) needed for medication assistance applications to CCM pharmacist.   Interventions: . CCM pharmacist will apply for medication assistance program for Trelegy Ellipta made by [place] and prescribed by Dr. Steele Sizer  Patient Self Care Activities:  Marland Kitchen Gather necessary documents needed to apply for medication assistance  Initial goal documentation        Thank you allowing the Chronic Care Management Team to be a part of your care!

## 2019-03-21 NOTE — Chronic Care Management (AMB) (Signed)
Chronic Care Management   Note  03/21/2019 Name: Terri Burns MRN: [id number] DOB: 09-18-1950  Subjective:  Terri Burns is a 68 year old female patient of Dr. Steele Sizer, referred to [facility] clinical pharmacy services for medication assistance (Trellegy). Of note, patient does not have Medicare Part D insurance on file, and is unsure which coverage this is Public house manager"). Telephone outreach today for initial pharmacy consultation, HIPAA identifiers verified.   Objective: Lab Results  Component Value Date   CREATININE 0.66 03/13/2019   CREATININE 0.81 08/30/2018   CREATININE 0.59 08/21/2018    No results found for: HGBA1C  Lipid Panel     Component Value Date/Time   CHOL 142 03/13/2019 0000   TRIG 37 03/13/2019 0000   HDL 84 03/13/2019 0000   CHOLHDL 1.7 03/13/2019 0000   LDLCALC 47 03/13/2019 0000    BP Readings from Last 3 Encounters:  03/13/19 (!) 152/78  12/11/18 124/68  09/06/18 (!) 146/86    Allergies  Allergen Reactions  . Levofloxacin Shortness Of Breath  . Penicillins Hives    itching  . Meloxicam     dizziness  . Nsaids Other (See Comments)    Patient states she can tolerate up to three doses per day without incident    Medications Reviewed Today    Reviewed by Vonna Kotyk, [place] (Certified Medical Assistant) on 03/13/19 at 1126  Med List Status: <None>  Medication Order Taking? Sig Documenting Provider Last Dose Status Informant  albuterol (VENTOLIN HFA) 108 (90 Base) MCG/ACT inhaler [id number] Yes Inhale 2 puffs into the lungs every 6 (six) hours as needed for wheezing or shortness of breath. Fredderick Severance, NP Taking Active Self  aspirin 81 MG EC tablet [id number] Yes TAKE 1 TABLET BY MOUTH EVERY DAY Sowles, Drue Stager, MD Taking Active   atorvastatin (LIPITOR) 20 MG tablet [id number] Yes Take 1 tablet (20 mg total) by mouth daily. Steele Sizer, MD Taking Active   celecoxib (CELEBREX) 200 MG capsule [id number] No Take 1 capsule by mouth  as needed. Nadene Rubins, DO Not Taking Active Self  citalopram (CELEXA) 20 MG tablet [id number] Yes Take 1 tablet (20 mg total) by mouth daily. Steele Sizer, MD Taking Active   fluticasone [facility]) 50 MCG/ACT nasal spray [id number] Yes SPRAY 2 SPRAYS INTO EACH NOSTRIL EVERY Eloise Harman, MD Taking Active   Fluticasone-Umeclidin-Vilant (TRELEGY ELLIPTA) 100-62.5-25 MCG/INH AEPB [id number] No Inhale 1 puff into the lungs daily.  Patient not taking: Reported on 03/13/2019   Steele Sizer, MD Not Taking Active            Med Note Wynetta Emery, [facility]   Tue Dec 11, 2018 11:28 AM) Unable to afford medication   HYDROcodone-acetaminophen (NORCO/VICODIN) 5-325 MG tablet [id number] Yes  [provider] Taking Active   ibuprofen (ADVIL,MOTRIN) 400 MG tablet [id number] Yes Take 1 tablet (400 mg total) by mouth every 6 (six) hours as needed. Max Sane, MD Taking Active   ipratropium-albuterol (DUONEB) 0.5-2.5 (3) MG/3ML SOLN [id number] Yes TAKE 3MLS BY NEBULIZER EVERY 6 HOURS AS NEEDED **NEED MEDICARE PART B INFOAncil Boozer, Drue Stager, MD Taking Active   loratadine (CLARITIN) 10 MG tablet [id number] Yes TAKE 1 TABLET BY MOUTH EVERY DAY Steele Sizer, MD Taking Active   metoprolol succinate (TOPROL-XL) 25 MG 24 hr tablet [id number] Yes TAKE 1 TABLET BY MOUTH EVERY DAY Steele Sizer, MD Taking Active   pantoprazole (PROTONIX) 40 MG tablet [id number] Yes Take 1 tablet (40 mg total) by mouth daily. Sowles, Valentine,  MD Taking Active   torsemide (DEMADEX) 20 MG tablet [id number] Yes Take 1 tablet (20 mg total) by mouth daily. Max Sane, MD Taking Active            Assessment:   #Medication Assistance: patient unable to afford Trelegy; uses DuoNeb solution instead of albuterol HFA  Goals Addressed            This Visit's Progress   . Medication cost- Trelegy (pt-stated)       Current Barriers:  . financial  Pharmacist Clinical Goal(s): Over the next 14 days, Terri Burns will provide  the necessary supplementary documents (proof of out of pocket prescription expenditure, proof of household income) needed for medication assistance applications to CCM pharmacist.   Interventions: . CCM pharmacist will apply for medication assistance program for Trelegy Ellipta made by [place] and prescribed by Dr. Steele Sizer  Patient Self Care Activities:  Marland Kitchen Gather necessary documents needed to apply for medication assistance  Initial goal documentation        Plan: Recommendations discussed with patient - gather financial documents and out of pocket document from pharmacy  Follow up: Telephone follow up appointment with care management team member scheduled for: 2 weeks   Ruben Reason, PharmD Clinical Pharmacist [facility] [phone]

## 2019-03-22 ENCOUNTER — Telehealth: Payer: Self-pay

## 2019-03-22 NOTE — Telephone Encounter (Signed)
Copied from [place] [phone]. Topic: General - Other >> Mar 21, 2019  3:20 PM Wynetta Emery, [place] C wrote: Reason for CRM: pt called in for lab results. Pt says that she is unable to access the mychart.  Called patient no answer.

## 2019-03-23 ENCOUNTER — Other Ambulatory Visit: Payer: Self-pay | Admitting: Family Medicine

## 2019-03-23 DIAGNOSIS — J42 Unspecified chronic bronchitis: Secondary | ICD-10-CM

## 2019-03-23 NOTE — Telephone Encounter (Signed)
Requested Prescriptions  Pending Prescriptions Disp Refills  . ipratropium-albuterol (DUONEB) 0.5-2.5 (3) MG/3ML SOLN [Pharmacy Med Name: IPRAT-ALBUT 0.5-3(2.5) MG/3 ML] 360 mL 0    Sig: TAKE 3MLS BY NEBULIZER EVERY 6 HOURS AS NEEDED **NEED MEDICARE PART B INFO**     There is no refill protocol information for this order

## 2019-04-22 ENCOUNTER — Other Ambulatory Visit: Payer: Self-pay | Admitting: Family Medicine

## 2019-04-22 DIAGNOSIS — I700 Atherosclerosis of aorta: Secondary | ICD-10-CM

## 2019-04-22 DIAGNOSIS — J3089 Other allergic rhinitis: Secondary | ICD-10-CM

## 2019-04-22 DIAGNOSIS — K219 Gastro-esophageal reflux disease without esophagitis: Secondary | ICD-10-CM

## 2019-04-22 DIAGNOSIS — I2510 Atherosclerotic heart disease of native coronary artery without angina pectoris: Secondary | ICD-10-CM

## 2019-05-01 ENCOUNTER — Telehealth: Payer: Self-pay | Admitting: *Deleted

## 2019-05-01 NOTE — Telephone Encounter (Signed)
Left a voicemail for patient to notify her that it is time to schedule her 6 month follow up lung cancer screening CT scan. Instructed patient to call back to let us know her scheduling preference prior to the CT scan.

## 2019-05-13 ENCOUNTER — Other Ambulatory Visit: Payer: Self-pay | Admitting: Family Medicine

## 2019-05-13 DIAGNOSIS — J3089 Other allergic rhinitis: Secondary | ICD-10-CM

## 2019-05-13 DIAGNOSIS — I10 Essential (primary) hypertension: Secondary | ICD-10-CM

## 2019-05-13 DIAGNOSIS — J42 Unspecified chronic bronchitis: Secondary | ICD-10-CM

## 2019-05-15 ENCOUNTER — Encounter: Payer: Self-pay | Admitting: *Deleted

## 2019-05-15 NOTE — Telephone Encounter (Signed)
Left message for patient to notify them that it is time to schedule annual low dose lung cancer screening CT scan. Instructed patient to call back to verify information prior to the scan being scheduled.  

## 2019-06-10 ENCOUNTER — Other Ambulatory Visit: Payer: Self-pay | Admitting: Family Medicine

## 2019-06-10 DIAGNOSIS — J42 Unspecified chronic bronchitis: Secondary | ICD-10-CM

## 2019-06-10 NOTE — Telephone Encounter (Signed)
   Notes to clinic:  NEEDED -NEED MEDICARE PART B INFO   Requested Prescriptions  Pending Prescriptions Disp Refills   ipratropium-albuterol (DUONEB) 0.5-2.5 (3) MG/3ML SOLN [Pharmacy Med Name: IPRAT-ALBUT 0.5-3(2.5) MG/3 ML] 360 mL 0    Sig: TAKE 3MLS BY NEBULIZER EVERY 6 HOURS AS NEEDED **NEED MEDICARE PART B INFO**      Pulmonology:  Combination Products Passed - 06/10/2019  1:57 AM      Passed - Valid encounter within last 12 months    Recent Outpatient Visits           2 months ago Centrilobular emphysema [facility])   [facility] Steele Sizer, MD   6 months ago Atherosclerosis of aorta [facility])   [facility] Steele Sizer, MD   9 months ago Respiratory crackles at both lung bases   [facility] Steele Sizer, MD   9 months ago Centrilobular emphysema [facility])   [facility] Steele Sizer, MD   1 year ago Centrilobular emphysema [facility])   [facility] Steele Sizer, MD       Future Appointments             In 4 days Steele Sizer, MD [facility], [facility]

## 2019-06-12 DIAGNOSIS — C4492 Squamous cell carcinoma of skin, unspecified: Secondary | ICD-10-CM

## 2019-06-12 HISTORY — DX: Squamous cell carcinoma of skin, unspecified: C44.92

## 2019-06-14 ENCOUNTER — Encounter: Payer: Self-pay | Admitting: Family Medicine

## 2019-06-14 ENCOUNTER — Other Ambulatory Visit: Payer: Self-pay

## 2019-06-14 ENCOUNTER — Ambulatory Visit (INDEPENDENT_AMBULATORY_CARE_PROVIDER_SITE_OTHER): Payer: Medicare Other | Admitting: Family Medicine

## 2019-06-14 VITALS — BP 137/111 | HR 68 | Ht 60.0 in | Wt 125.0 lb

## 2019-06-14 DIAGNOSIS — G8929 Other chronic pain: Secondary | ICD-10-CM | POA: Diagnosis not present

## 2019-06-14 DIAGNOSIS — I2510 Atherosclerotic heart disease of native coronary artery without angina pectoris: Secondary | ICD-10-CM

## 2019-06-14 DIAGNOSIS — M542 Cervicalgia: Secondary | ICD-10-CM

## 2019-06-14 DIAGNOSIS — K219 Gastro-esophageal reflux disease without esophagitis: Secondary | ICD-10-CM

## 2019-06-14 DIAGNOSIS — F3342 Major depressive disorder, recurrent, in full remission: Secondary | ICD-10-CM | POA: Diagnosis not present

## 2019-06-14 DIAGNOSIS — J432 Centrilobular emphysema: Secondary | ICD-10-CM

## 2019-06-14 DIAGNOSIS — I2584 Coronary atherosclerosis due to calcified coronary lesion: Secondary | ICD-10-CM | POA: Diagnosis not present

## 2019-06-14 DIAGNOSIS — I700 Atherosclerosis of aorta: Secondary | ICD-10-CM

## 2019-06-14 DIAGNOSIS — I10 Essential (primary) hypertension: Secondary | ICD-10-CM

## 2019-06-14 MED ORDER — ATORVASTATIN CALCIUM 20 MG PO TABS: 20.0000 mg | ORAL_TABLET | Freq: Every day | ORAL | 0 refills | Status: DC

## 2019-06-14 MED ORDER — TRAMADOL HCL 50 MG PO TABS: 50.0000 mg | ORAL_TABLET | Freq: Every day | ORAL | 0 refills | Status: AC

## 2019-06-14 MED ORDER — CELECOXIB 200 MG PO CAPS: 200.0000 mg | ORAL_CAPSULE | ORAL | 0 refills | Status: DC | PRN

## 2019-06-14 MED ORDER — PANTOPRAZOLE SODIUM 40 MG PO TBEC: 40.0000 mg | DELAYED_RELEASE_TABLET | Freq: Every day | ORAL | 0 refills | Status: DC

## 2019-06-14 MED ORDER — CITALOPRAM HYDROBROMIDE 20 MG PO TABS: 20.0000 mg | ORAL_TABLET | Freq: Every day | ORAL | 1 refills | Status: DC

## 2019-06-14 MED ORDER — LOSARTAN POTASSIUM 50 MG PO TABS: 50.0000 mg | ORAL_TABLET | Freq: Every day | ORAL | 0 refills | Status: DC

## 2019-06-14 NOTE — Progress Notes (Signed)
Name: Terri Burns   MRN: [id number]    DOB: 1950/08/13   Date:06/14/2019       Progress Note  Subjective  Chief Complaint  Chief Complaint  Patient presents with  . Medication Refill  . Hypertension    I connected with  Terri Burns on 06/14/19 at  1:20 PM EST by telephone and verified that I am speaking with the correct person using two identifiers.  I discussed the limitations, risks, security and privacy concerns of performing an evaluation and management service by telephone and the availability of in person appointments. Staff also discussed with the patient that there may be a patient responsible charge related to this service. Patient Location: at home  Provider Location: [facility]   HPI  Chronic neck pain:she has been on disability since 2007,[facility], had x-ray andMRI done, she has DDD.She fell March 2020 and fracture her c-spine at C2 level, she went to [facility] just [facility], but she fell down again a few days later and had to be admitted because of severe hypotension , hypoxia and had COPD exacerbation. She continues to have constant  neck pain, wearing a neck color, she is taking ibuprofen otc, we will try switching to Celebrex. She was referred to pain clinic and will schedule an appointment . She has been out of Hydrocodone. She states pain right now is 5/10 but can be more intense at times. She is willing to try tramadol until seen by pain clinic   HPV positive: explained that she needs to return for pap smear-however because of neck problems she would like to hold off on that. Unchanged   HTN: sheis only on metoprolol she has been off  Hctz and Losartan since neck surgery and hypotensive episodes in Summer 2020, bp has been gradually getting higher. . She noticed bp spiking at home over the past month, in the 140's/95-110. No chest pain or palpitation . We will resume losartan and add hctz if remains hight, she will keep a  log of bp at home and contact us if it drops again   COPD : CT showed emphysema, shehad a flare March 2020. She denies SOB, she has daily coughing, unable to afford Trelegy and has been using neb solution 4 times a day and seems to help She states she is trying to get approved for Medicaid.   Major depression: shewas in remission, taking citalopram, however lost her daughter May 2020 and was grieving, she is feeling better now, cutting down on cigarettes. States not crying anymore , she has some lack of energy at times but doesn't think it is from depression   Atherosclerosis aorta and coronary vessels, seen on CT chest : discussed results with patient she is taking  aspirin and atorvastatin , reviewed last labs   Patient Active Problem List   Diagnosis Date Noted  . Acute respiratory failure with hypoxemia ([place]) 08/20/2018  . Skin lesion of face 05/01/2018  . Atherosclerosis of aorta ([place]) 03/04/2018  . Coronary artery calcification of native artery 03/04/2018  . 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 Chronic pain 01/01/2018  . Benign essential HTN 01/01/2018  . Senile purpura ([place]) 01/01/2018  . Recurrent major depressive disorder, in full remission ([place]) 01/01/2018  . Hyperglycemia 09/27/2016  . Tobacco abuse 08/20/2015  . Allergic rhinitis, seasonal 05/12/2015  . Emphysema lung ([place]) 05/12/2015  . Degeneration of intervertebral disc of cervical region 05/12/2015  . GERD (gastroesophageal reflux  disease) 05/12/2015  . Major depression in remission ([place]) 05/12/2015    Past Surgical History:  Procedure Laterality Date  . NECK SURGERY N/A 10/2003  . SHOULDER SURGERY Left 2004    Family History  Problem Relation Age of Onset  . Hypertension Mother   . Stroke Mother   . Heart disease Father   . Diabetes Father   . Heart disease Daughter   . Pulmonary embolism Daughter   . Hypertension Son      Current Outpatient Medications:  .  albuterol (VENTOLIN  HFA) 108 (90 Base) MCG/ACT inhaler, Inhale 2 puffs into the lungs every 6 (six) hours as needed for wheezing or shortness of breath., Disp: 18 Inhaler, Rfl: 2 .  aspirin 81 MG EC tablet, TAKE 1 TABLET BY MOUTH EVERY DAY, Disp: 90 tablet, Rfl: 0 .  atorvastatin (LIPITOR) 20 MG tablet, TAKE 1 TABLET BY MOUTH EVERY DAY, Disp: 90 tablet, Rfl: 0 .  citalopram (CELEXA) 20 MG tablet, Take 1 tablet (20 mg total) by mouth daily., Disp: 90 tablet, Rfl: 1 .  fluticasone (FLONASE) 50 MCG/ACT nasal spray, SPRAY 2 SPRAYS INTO EACH NOSTRIL EVERY DAY, Disp: 16 mL, Rfl: 0 .  ibuprofen (ADVIL,MOTRIN) 400 MG tablet, Take 1 tablet (400 mg total) by mouth every 6 (six) hours as needed., Disp: 30 tablet, Rfl: 0 .  ipratropium-albuterol (DUONEB) 0.5-2.5 (3) MG/3ML SOLN, TAKE 3MLS BY NEBULIZER EVERY 6 HOURS AS NEEDED **NEED MEDICARE PART B INFO**, Disp: 360 mL, Rfl: 0 .  loratadine (CLARITIN) 10 MG tablet, TAKE 1 TABLET BY MOUTH EVERY DAY, Disp: 90 tablet, Rfl: 0 .  metoprolol succinate (TOPROL-XL) 25 MG 24 hr tablet, TAKE 1 TABLET BY MOUTH EVERY DAY, Disp: 90 tablet, Rfl: 0 .  pantoprazole (PROTONIX) 40 MG tablet, TAKE 1 TABLET BY MOUTH EVERY DAY, Disp: 90 tablet, Rfl: 0 .  celecoxib (CELEBREX) 200 MG capsule, Take 1 capsule by mouth as needed., Disp: , Rfl:  .  HYDROcodone-acetaminophen (NORCO/VICODIN) 5-325 MG tablet, , Disp: , Rfl:  .  torsemide (DEMADEX) 20 MG tablet, Take 1 tablet (20 mg total) by mouth daily. (Patient not taking: Reported on 06/14/2019), Disp: 30 tablet, Rfl: 0  Allergies  Allergen Reactions  . Levofloxacin Shortness Of Breath  . Penicillins Hives    itching  . Meloxicam     dizziness  . Nsaids Other (See Comments)    Patient states she can tolerate up to three doses per day without incident    I personally reviewed active problem list, medication list, allergies, family history, social history with the patient/caregiver today.   ROS   Ten systems reviewed and is negative except as  mentioned in HPI   Objective  Virtual encounter, vitals obtained at home  Vitals:   06/14/19 0801  BP: (!) 137/111  Pulse: 68    Body mass index is 24.41 kg/m.  Physical Exam  Awake, alert and oriented  PHQ2/9: Depression screen [facility] 2/9 06/14/2019 03/13/2019 12/11/2018 09/06/2018 08/30/2018  Decreased Interest 0 1 1 0 1  Down, Depressed, Hopeless 0 0 1 0 0  PHQ - 2 Score 0 1 2 0 1  Altered sleeping 0 1 0 0 0  Tired, decreased energy 0 1 1 0 1  Change in appetite 0 0 0 0 0  Feeling bad or failure about yourself  0 0 0 0 0  Trouble concentrating 0 0 0 0 0  Moving slowly or fidgety/restless 0 0 0 0 0  Suicidal thoughts 0 0 0  0 0  PHQ-9 Score 0 3 3 0 2  Difficult doing work/chores Not difficult at all Somewhat difficult Not difficult at all Not difficult at all Somewhat difficult  Some recent data might be hidden   PHQ-2/9 Result is negative.    Fall Risk: Fall Risk  06/14/2019 03/13/2019 12/11/2018 08/30/2018 05/01/2018  Falls in the past year? 1 1 1 1  0  Number falls in past yr: 1 1 1 1  0  Injury with Fall? 1 1 1 1  -  Comment - Broke a vertebrae in her neck C2 Broken Neck and put a hole in her head - -  Risk for fall due to : - History of fall(s);Impaired balance/gait Impaired balance/gait;Other (Comment);Medication side effect History of fall(s) -  Risk for fall due to: Comment - - Dizzy and BP was too low - -  Follow up - - - Follow up appointment -     Assessment & Plan  1. Gastroesophageal reflux disease  - pantoprazole (PROTONIX) 40 MG tablet; Take 1 tablet (40 mg total) by mouth daily.  Dispense: 90 tablet; Refill: 0  2. Recurrent major depressive disorder, in full remission ([place])  - citalopram (CELEXA) 20 MG tablet; Take 1 tablet (20 mg total) by mouth daily.  Dispense: 90 tablet; Refill: 1  3. Atherosclerosis of aorta (H[facility])  - atorvastatin (LIPITOR) 20 MG tablet; Take 1 tablet (20 mg total) by mouth daily.  Dispense: 90 tablet; Refill: 0  4. Coronary artery  calcification of native artery  - atorvastatin (LIPITOR) 20 MG tablet; Take 1 tablet (20 mg total) by mouth daily.  Dispense: 90 tablet; Refill: 0  5. Essential hypertension  - losartan (COZAAR) 50 MG tablet; Take 1 tablet (50 mg total) by mouth daily.  Dispense: 90 tablet; Refill: 0  6. Centrilobular emphysema ([place])   7. Chronic neck pain  - celecoxib (CELEBREX) 200 MG capsule; Take 1 capsule (200 mg total) by mouth as needed.  Dispense: 90 capsule; Refill: 0 - traMADol (ULTRAM) 50 MG tablet; Take 1 tablet (50 mg total) by mouth at bedtime.  Dispense: 30 tablet; Refill: 0  I discussed the assessment and treatment plan with the patient. The patient was provided an opportunity to ask questions and all were answered. The patient agreed with the plan and demonstrated an understanding of the instructions.   The patient was advised to call back or seek an in-person evaluation if the symptoms worsen or if the condition fails to improve as anticipated.  I provided 25  minutes of non-face-to-face time during this encounter.  Loistine Chance, MD

## 2019-06-20 ENCOUNTER — Other Ambulatory Visit: Payer: Self-pay | Admitting: Family Medicine

## 2019-06-20 DIAGNOSIS — J42 Unspecified chronic bronchitis: Secondary | ICD-10-CM

## 2019-06-21 IMAGING — DX DG FOOT COMPLETE 3+V*R*
3 series · 3 of 3 positions shown · non-contrast
Comparison: None.

CLINICAL DATA: Dropped object on foot last night. Unable to bear
weight.

EXAM:
RIGHT FOOT COMPLETE - 3+ VIEW

[foot ap]
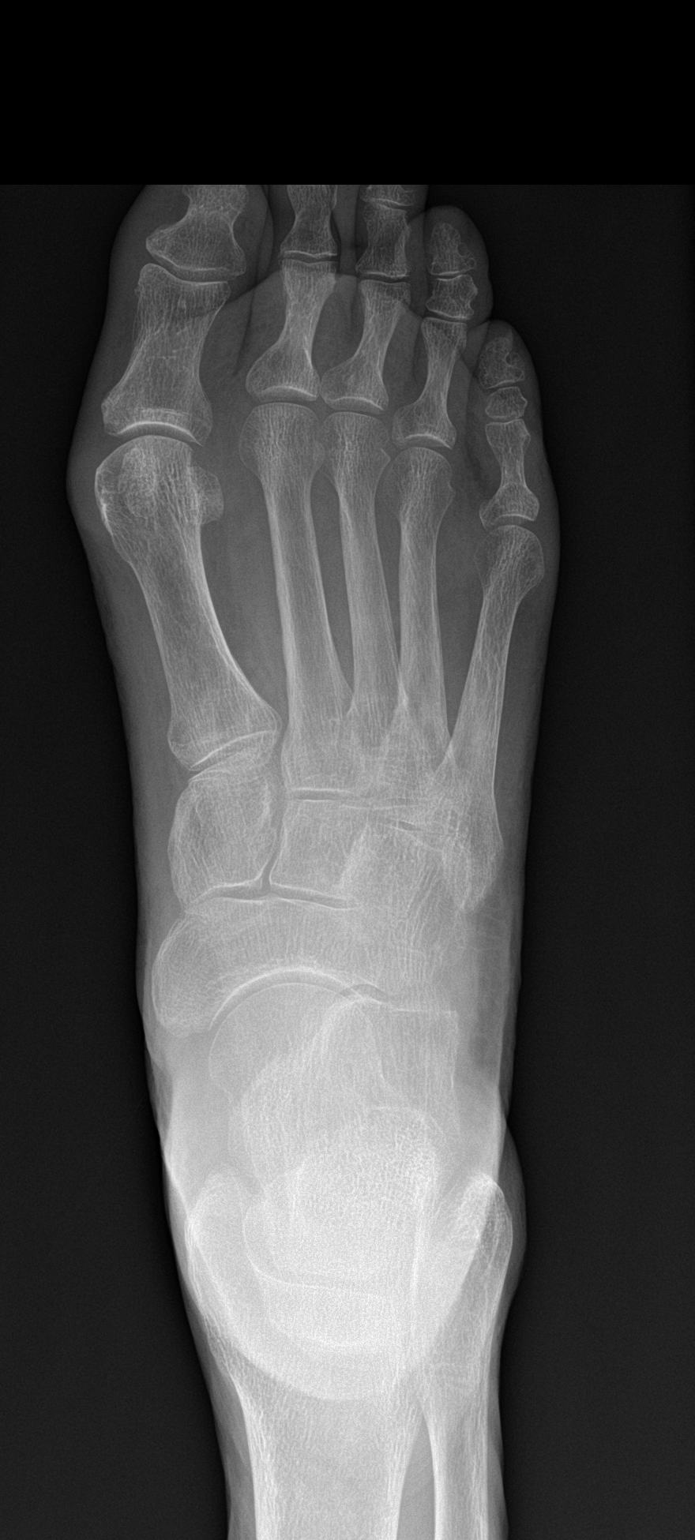

[foot obl]
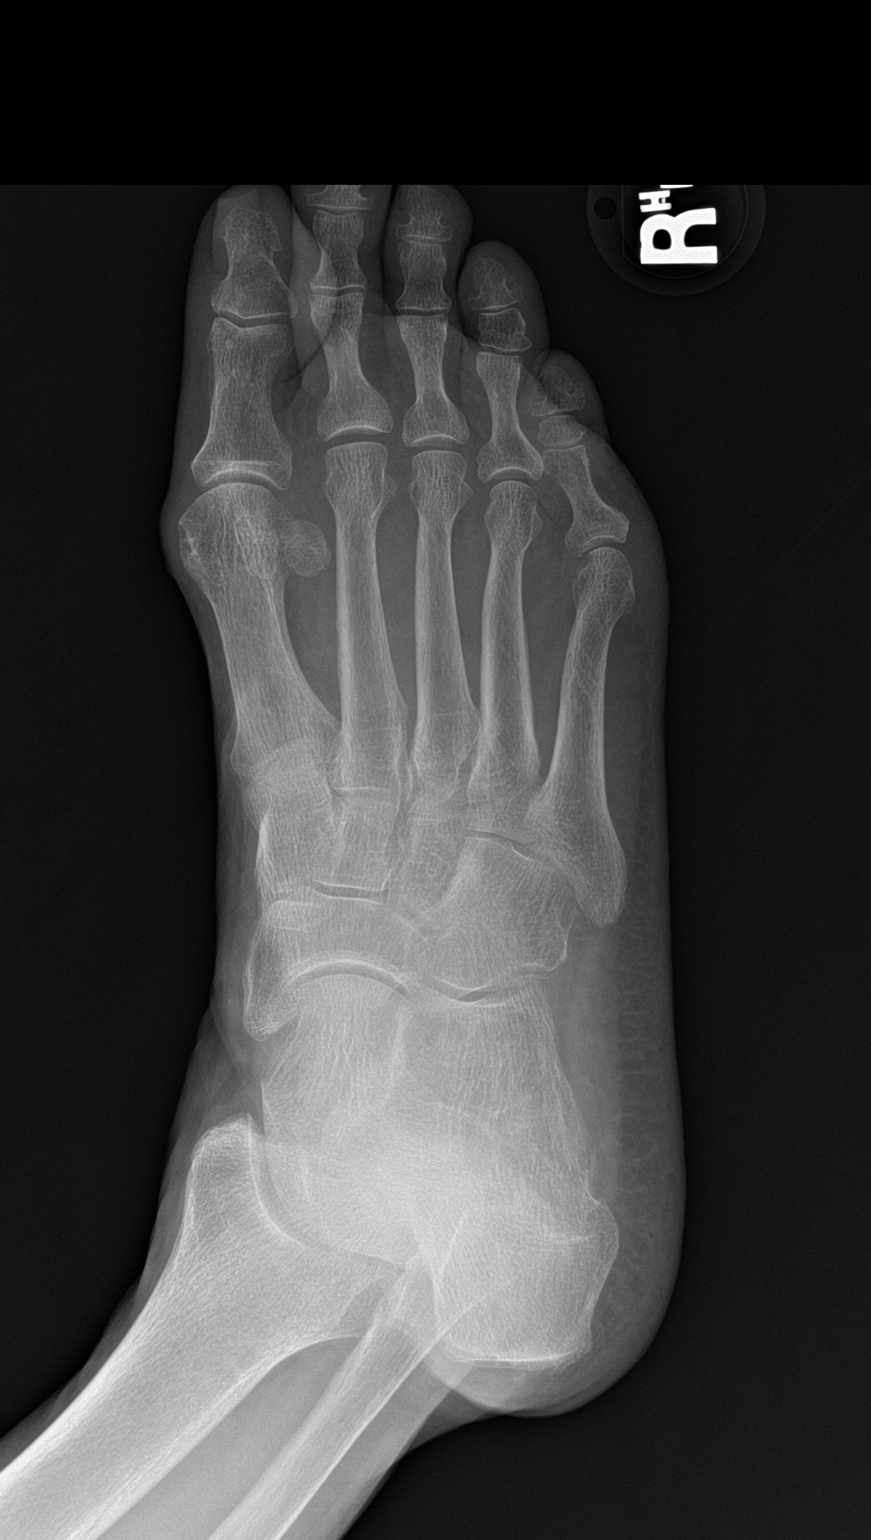

[foot lat]
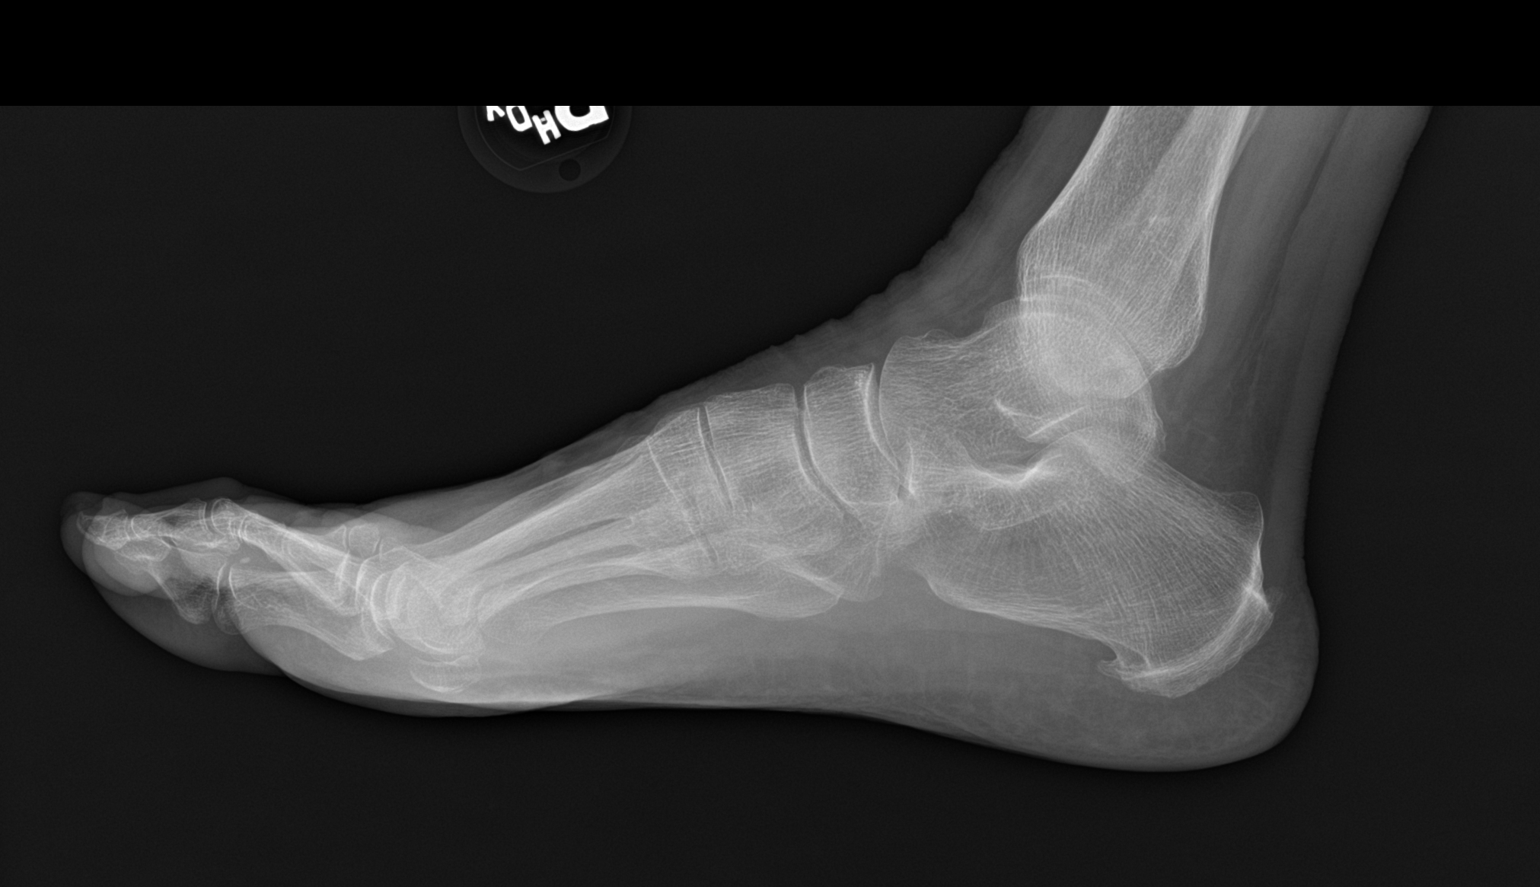

[3 of 3 positions shown; findings below may reference images not displayed]

FINDINGS: The joint spaces are maintained. No acute fracture is identified.
Mild calcaneal spurring changes are noted.
IMPRESSION: No acute bony findings.

## 2019-06-24 ENCOUNTER — Telehealth: Payer: Self-pay | Admitting: Family Medicine

## 2019-06-24 DIAGNOSIS — J42 Unspecified chronic bronchitis: Secondary | ICD-10-CM

## 2019-06-24 NOTE — Telephone Encounter (Signed)
Requested medication (s) are due for refill today:  Yes  Requested medication (s) are on the active medication list:   Yes  Future visit scheduled:   No  Just seen a week ago   Last ordered: 05/13/2019  360 ml  0 refills       Need Medicare Part B info.   Requested Prescriptions  Pending Prescriptions Disp Refills   ipratropium-albuterol (DUONEB) 0.5-2.5 (3) MG/3ML SOLN [Pharmacy Med Name: IPRAT-ALBUT 0.5-3(2.5) MG/3 ML] 360 mL 0    Sig: TAKE 3MLS BY NEBULIZER EVERY 6 HOURS AS NEEDED **NEED MEDICARE PART B INFO**      Pulmonology:  Combination Products Passed - 06/24/2019  2:26 PM      Passed - Valid encounter within last 12 months    Recent Outpatient Visits           1 week ago Centrilobular emphysema [facility])   [facility] Steele Sizer, MD   3 months ago Centrilobular emphysema [facility])   [facility] Steele Sizer, MD   6 months ago Atherosclerosis of aorta [facility])   [facility] Steele Sizer, MD   9 months ago Respiratory crackles at both lung bases   [facility] Steele Sizer, MD   9 months ago Centrilobular emphysema [facility])   [facility] Steele Sizer, MD

## 2019-06-25 NOTE — Telephone Encounter (Signed)
lvm asking pt to schedule appt in 3-4 months

## 2019-07-09 ENCOUNTER — Ambulatory Visit (INDEPENDENT_AMBULATORY_CARE_PROVIDER_SITE_OTHER): Payer: Medicare Other

## 2019-07-09 VITALS — Ht 60.0 in | Wt 125.0 lb

## 2019-07-09 DIAGNOSIS — Z0000 Encounter for general adult medical examination without abnormal findings: Secondary | ICD-10-CM

## 2019-07-09 NOTE — Progress Notes (Signed)
Subjective:   Terri Burns is a 69 y.o. female who presents for Medicare Annual (Subsequent) preventive examination.  Virtual Visit via Telephone Note  I connected with Terri Burns on 07/09/19 at  1:30 PM EST by telephone and verified that I am speaking with the correct person using two identifiers.  Medicare Annual Wellness visit completed telephonically due to Covid-19 pandemic.   Location: Patient: home Provider: office   I discussed the limitations, risks, security and privacy concerns of performing an evaluation and management service by telephone and the availability of in person appointments. The patient expressed understanding and agreed to proceed.  Some vital signs may be absent or patient reported.   Clemetine Marker, LPN    Review of Systems:   Cardiac Risk Factors include: advanced age (>36men, >39 women);dyslipidemia;hypertension;smoking/ tobacco exposure     Objective:     Vitals: Ht 5' (1.524 m)   Wt 125 lb (56.7 kg)   BMI 24.41 kg/m   Body mass index is 24.41 kg/m.  Advanced Directives 07/09/2019 08/20/2018 08/16/2018 07/15/2018 03/29/2017 09/27/2016 09/20/2016  Does Patient Have a Medical Advance Directive? No No No No No No No  Would patient like information on creating a medical advance directive? No - Patient declined No - Patient declined - - No - Patient declined - -    Tobacco Social History   Tobacco Use  Smoking Status Current Every Day Smoker  . Packs/day: 0.50  . Years: 45.00  . Pack years: 22.50  . Types: Cigarettes  . Start date: 12/10/1973  Smokeless Tobacco Never Used  Tobacco Comment   previous pack a day smoker     Ready to quit: No Counseling given: Not Answered Comment: previous pack a day smoker   Clinical Intake:  Pre-visit preparation completed: Yes  Pain : 0-10 Pain Score: 4  Pain Type: Chronic pain Pain Location: Neck Pain Descriptors / Indicators: Aching, Discomfort Pain Onset: More than a month ago Pain  Frequency: Constant     BMI - recorded: 24.41 Nutritional Status: BMI of 19-24  Normal Nutritional Risks: None Diabetes: No  How often do you need to have someone help you when you read instructions, pamphlets, or other written materials from your doctor or pharmacy?: 1 - Never  Interpreter Needed?: No  Information entered by :: Clemetine Marker LPN  Past Medical History:  Diagnosis Date  . COPD (chronic obstructive pulmonary disease) ([place])   . Depression   . GERD (gastroesophageal reflux disease)   . Hyperlipidemia   . Hypertension    Past Surgical History:  Procedure Laterality Date  . NECK SURGERY N/A 10/2003  . SHOULDER SURGERY Left 2004   Family History  Problem Relation Age of Onset  . Hypertension Mother   . Stroke Mother   . Heart disease Father   . Diabetes Father   . Heart disease Daughter   . Pulmonary embolism Daughter   . Hypertension Son    Social History   Socioeconomic History  . Marital status: Widowed    Spouse name: Not on file  . Number of children: 3  . Years of education: Not on file  . Highest education level: Some college, no degree  Occupational History  . 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 Smoking status: Current Every Day Smoker    Packs/day: 0.50    Years: 45.00    Pack years: 22.50    Types: Cigarettes  Start date: 12/10/1973  . Smokeless tobacco: Never Used  . Tobacco comment: previous pack a day smoker  Substance and Sexual Activity  . Alcohol use: Yes    Alcohol/week: 0.0 standard drinks    Comment: occasionally  . Drug use: No  . Sexual activity: Not Currently    Partners: Male  Other Topics Concern  . Not on file  Social History Narrative   Lives in a senior citizen complex and goes out with her friends.   Her daughterdied  due to heart issues on Oct 11, 2018 at the age of 87.   Both sons live in town and are helping her out    Social Determinants of Health   Financial  Resource Strain: High Risk  . Difficulty of Paying Living Expenses: Hard  Food Insecurity: Food Insecurity Present  . Worried About Charity fundraiser in the Last Year: Sometimes true  . Ran Out of Food in the Last Year: Never true  Transportation Needs: No Transportation Needs  . Lack of Transportation (Medical): No  . Lack of Transportation (Non-Medical): No  Physical Activity: Inactive  . Days of Exercise per Week: 0 days  . Minutes of Exercise per Session: 0 min  Stress: No Stress Concern Present  . Feeling of Stress : Only a little  Social Connections: Moderately Isolated  . Frequency of Communication with Friends and Family: More than three times a week  . Frequency of Social Gatherings with Friends and Family: More than three times a week  . Attends Religious Services: Never  . Active Member of Clubs or Organizations: No  . Attends Archivist Meetings: Never  . Marital Status: Widowed    Outpatient Encounter Medications as of 07/09/2019  Medication Sig  . albuterol (VENTOLIN HFA) 108 (90 Base) MCG/ACT inhaler Inhale 2 puffs into the lungs every 6 (six) hours as needed for wheezing or shortness of breath.  Marland Kitchen aspirin 81 MG EC tablet TAKE 1 TABLET BY MOUTH EVERY DAY  . atorvastatin (LIPITOR) 20 MG tablet Take 1 tablet (20 mg total) by mouth daily.  . celecoxib (CELEBREX) 200 MG capsule Take 1 capsule (200 mg total) by mouth as needed.  . citalopram (CELEXA) 20 MG tablet Take 1 tablet (20 mg total) by mouth daily.  . fluticasone (FLONASE) 50 MCG/ACT nasal spray SPRAY 2 SPRAYS INTO EACH NOSTRIL EVERY DAY  . ipratropium-albuterol (DUONEB) 0.5-2.5 (3) MG/3ML SOLN TAKE 3MLS BY NEBULIZER EVERY 6 HOURS AS NEEDED **NEED MEDICARE PART B INFO**  . losartan (COZAAR) 50 MG tablet Take 1 tablet (50 mg total) by mouth daily.  . metoprolol succinate (TOPROL-XL) 25 MG 24 hr tablet TAKE 1 TABLET BY MOUTH EVERY DAY  . pantoprazole (PROTONIX) 40 MG tablet Take 1 tablet (40 mg total) by  mouth daily.  . traMADol (ULTRAM) 50 MG tablet Take 1 tablet (50 mg total) by mouth at bedtime.  Marland Kitchen loratadine (CLARITIN) 10 MG tablet TAKE 1 TABLET BY MOUTH EVERY DAY (Patient not taking: Reported on 07/09/2019)   No facility-administered encounter medications on file as of 07/09/2019.    Activities of Daily Living In your present state of health, do you have any difficulty performing the following activities: 07/09/2019 06/14/2019  Hearing? N N  Comment declines hearing aids -  Vision? N N  Difficulty concentrating or making decisions? N N  Walking or climbing stairs? Y N  Dressing or bathing? N N  Doing errands, shopping? [place]  and eating ? N -  Using the Toilet? N -  In the past six months, have you accidently leaked urine? Y -  Comment wears pads for protection -  Do you have problems with loss of bowel control? N -  Managing your Medications? N -  Managing your Finances? N -  Housekeeping or managing your Housekeeping? N -  Some recent data might be hidden    Patient Care Team: Steele Sizer, MD as PCP - General (Family Medicine) Nadene Rubins, DO as Referring Physician (Physical Medicine and Rehabilitation) Cathi Roan, [facility] (Pharmacist)    Assessment:   This is a routine wellness examination for Terri Burns.  Exercise Activities and Dietary recommendations Current Exercise Habits: The patient does not participate in regular exercise at present, Exercise limited by: orthopedic condition(s)  Goals    . Medication cost- Trelegy (pt-stated)     Current Barriers:  . financial  Pharmacist Clinical Goal(s): Over the next 14 days, Terri Burns will provide the necessary supplementary documents (proof of out of pocket prescription expenditure, proof of household income) needed for medication assistance applications to CCM pharmacist.   Interventions: . [facility]CM pharmacist will apply for medication assistance program for Trelegy Ellipta made by [place] and  prescribed by Dr. Steele Sizer  Patient Self Care Activities:  Marland Kitchen Gather necessary documents needed to apply for medication assistance  Initial goal documentation     . Quit smoking / using tobacco     Recommend to attend smoking cessation classes with [facility] to discuss smoking cessation       Fall Risk Fall Risk  07/09/2019 06/14/2019 03/13/2019 12/11/2018 08/30/2018  Falls in the past year? 1 1 1 1 1   Number falls in past yr: 1 1 1 1 1   Injury with Fall? 1 1 1 1 1   Comment - - Broke a vertebrae in her neck C2 Broken Neck and put a hole in her head -  Risk for fall due to : History of fall(s);Impaired vision;Orthopedic patient - History of fall(s);Impaired balance/gait Impaired balance/gait;Other (Comment);Medication side effect History of fall(s)  Risk for fall due to: Comment - - - Dizzy and BP was too low -  Follow up Falls prevention discussed - - - Follow up appointment   [place]:  Any stairs in or around the home? No  If so, do they handrails? No   Home free of loose throw rugs in walkways, pet beds, electrical cords, etc? Yes  Adequate lighting in your home to reduce risk of falls? Yes   ASSISTIVE DEVICES UTILIZED TO PREVENT FALLS:  Life alert? No  Use of a cane, walker or w/c? Yes  Grab bars in the bathroom? Yes Shower chair or bench in shower? Yes  Elevated toilet seat or a handicapped toilet? Yes   DME ORDERS:  DME order needed?  No   TIMED UP AND GO:  Was the test performed? No . Telephonic visit.   Education: Fall risk prevention has been discussed.  Intervention(s) required? No   Depression Screen PHQ 2/9 Scores 07/09/2019 07/09/2019 06/14/2019 03/13/2019  PHQ - 2 Score 0 0 0 1  PHQ- 9 Score - - 0 3     Cognitive Function     6CIT Screen 07/09/2019  What Year? 0 points  What month? 0 points  What time? 0 points  Count back from 20 0 points  Months in reverse 0 points  Repeat phrase 0 points  Total Score  0     Immunization History  Administered Date(s) Administered  . Fluad Quad(high Dose 65+) 03/13/2019  . Influenza, High Dose Seasonal PF 03/29/2017  . Influenza, Seasonal, Injecte, Preservative Fre 03/26/2012  . Influenza,inj,Quad PF,6+ Mos 03/18/2013, 05/12/2015, 05/09/2016  . Pneumococcal Conjugate-13 08/30/2018  . Pneumococcal Polysaccharide-23 03/08/2010, 06/09/2013  . Tdap 05/27/2013    Qualifies for Shingles Vaccine? Yes  . Due for Shingrix. Education has been provided regarding the importance of this vaccine. Pt has been advised to call insurance company to determine out of pocket expense. Advised may also receive vaccine at local pharmacy or Health Dept. Verbalized acceptance and understanding.  Tdap: Up to date  Flu Vaccine: Up to date  Pneumococcal Vaccine: Up to date   Screening Tests Health Maintenance  Topic Date Due  . MAMMOGRAM  01/13/2002  . COLONOSCOPY  01/13/2002  . DEXA SCAN  01/13/2017  . PNA vac Low Risk Adult (2 of 2 - PPSV23) 08/30/2019  . TETANUS/TDAP  05/28/2023  . INFLUENZA VACCINE  Completed  . Hepatitis C Screening  Completed    Cancer Screenings:  Colorectal Screening: Not Completed. Pt declines this screening.   Mammogram: Due. Ordered 12/11/18. Pt provided with contact information and advised to call to schedule appt.   Bone Density: Due. Ordered 12/11/18. Pt provided with contact information and advised to call to schedule appt.   Lung Cancer Screening: (Low Dose CT Chest recommended if Age 11-80 years, 30 pack-year currently smoking OR have quit w/in 15years.) does qualify. Chest CT done 11/08/18.   Additional Screening:  Hepatitis C Screening: does qualify; Completed 11/22/17.  Vision Screening: Recommended annual ophthalmology exams for early detection of glaucoma and other disorders of the eye. Is the patient up to date with their annual eye exam?  No  Who is the provider or what is the name of the office in which the pt attends annual  eye exams? Not established If pt is not established with a provider, would they like to be referred to a provider to establish care? No .   Dental Screening: Recommended annual dental exams for proper oral hygiene  Community Resource Referral:  CRR required this visit?  No      Plan:     I have personally reviewed and addressed the Medicare Annual Wellness questionnaire and have noted the following in the patient's chart:  A. Medical and social history B. Use of alcohol, tobacco or illicit drugs  C. Current medications and supplements D. Functional ability and status E.  Nutritional status F.  Physical activity G. Advance directives H. List of other physicians I.  Hospitalizations, surgeries, and ER visits in previous 12 months J.  [place] such as hearing and vision if needed, cognitive and depression L. Referrals and appointments   In addition, I have reviewed and discussed with patient certain preventive protocols, quality metrics, and best practice recommendations. A written personalized care plan for preventive services as well as general preventive health recommendations were provided to patient.   Signed,  Clemetine Marker, LPN Nurse Health Advisor   Nurse Notes: none

## 2019-07-09 NOTE — Patient Instructions (Signed)
[name] , Thank you for taking time to come for your Medicare Wellness Visit. I appreciate your ongoing commitment to your health goals. Please review the following plan we discussed and let me know if I can assist you in the future.   Screening recommendations/referrals: Colonoscopy: postponed Mammogram: Please call [phone] to schedule your mammogram and bone density screening.   Recommended yearly ophthalmology/optometry visit for glaucoma screening and checkup Recommended yearly dental visit for hygiene and checkup  Vaccinations: Influenza vaccine: done 03/13/19 Pneumococcal vaccine: done 08/30/18 Tdap vaccine: done 05/27/13 Shingles vaccine: Shingrix discussed. Please contact your pharmacy for coverage information.   Advanced directives: Please bring a copy of your health care power of attorney and living will to the office at your convenience once you have completed those documents.    Conditions/risks identified: If you wish to quit smoking, help is available. For free tobacco cessation program offerings call the [facility] at [phone] or Live Well Line at 616 692 4126. You may also visit [email or web address] or email [email or web address] for more information on other programs.    Next appointment: Please follow up in one year for your Medicare Annual Wellness visit.     Preventive Care 68 Years and Older, Female Preventive care refers to lifestyle choices and visits with your health care provider that can promote health and wellness. What does preventive care include?  A yearly physical exam. This is also called an annual well check.  Dental exams once or twice a year.  Routine eye exams. Ask your health care provider how often you should have your eyes checked.  Personal lifestyle choices, including:  Daily care of your teeth and gums.  Regular physical activity.  Eating a healthy diet.  Avoiding tobacco and drug use.  Limiting  alcohol use.  Practicing safe sex.  Taking low-dose aspirin every day.  Taking vitamin and mineral supplements as recommended by your health care provider. What happens during an annual well check? The services and screenings done by your health care provider during your annual well check will depend on your age, overall health, lifestyle risk factors, and family history of disease. Counseling  Your health care provider may ask you questions about your:  Alcohol use.  Tobacco use.  Drug use.  Emotional well-being.  Home and relationship well-being.  Sexual activity.  Eating habits.  History of falls.  Memory and ability to understand (cognition).  Work and work Statistician.  Reproductive health. Screening  You may have the following tests or measurements:  Height, weight, and BMI.  Blood pressure.  Lipid and cholesterol levels. These may be checked every 5 years, or more frequently if you are over 11 years old.  Skin check.  Lung cancer screening. You may have this screening every year starting at age 58 if you have a 30-pack-year history of smoking and currently smoke or have quit within the past 15 years.  Fecal occult blood test (FOBT) of the stool. You may have this test every year starting at age 44.  Flexible sigmoidoscopy or colonoscopy. You may have a sigmoidoscopy every 5 years or a colonoscopy every 10 years starting at age 36.  Hepatitis C blood test.  Hepatitis B blood test.  Sexually transmitted disease (STD) testing.  Diabetes screening. This is done by checking your blood sugar (glucose) after you have not eaten for a while (fasting). You may have this done every 1-3 years.  Bone density scan. This is done to screen for osteoporosis. You may  have this done starting at age 63.  Mammogram. This may be done every 1-2 years. Talk to your health care provider about how often you should have regular mammograms. Talk with your health care provider  about your test results, treatment options, and if necessary, the need for more tests. Vaccines  Your health care provider may recommend certain vaccines, such as:  Influenza vaccine. This is recommended every year.  Tetanus, diphtheria, and acellular pertussis (Tdap, Td) vaccine. You may need a Td booster every 10 years.  Zoster vaccine. You may need this after age 73.  Pneumococcal 13-valent conjugate (PCV13) vaccine. One dose is recommended after age 33.  Pneumococcal polysaccharide (PPSV23) vaccine. One dose is recommended after age 38. Talk to your health care provider about which screenings and vaccines you need and how often you need them. This information is not intended to replace advice given to you by your health care provider. Make sure you discuss any questions you have with your health care provider. Document Released: 06/19/2015 Document Revised: 02/10/2016 Document Reviewed: 03/24/2015 Elsevier Interactive Patient Education  2017 [employer] Prevention in the Home Falls can cause injuries. They can happen to people of all ages. There are many things you can do to make your home safe and to help prevent falls. What can I do on the outside of my home?  Regularly fix the edges of walkways and driveways and fix any cracks.  Remove anything that might make you trip as you walk through a door, such as a raised step or threshold.  Trim any bushes or trees on the path to your home.  Use bright outdoor lighting.  Clear any walking paths of anything that might make someone trip, such as rocks or tools.  Regularly check to see if handrails are loose or broken. Make sure that both sides of any steps have handrails.  Any raised decks and porches should have guardrails on the edges.  Have any leaves, snow, or ice cleared regularly.  Use sand or salt on walking paths during winter.  Clean up any spills in your garage right away. This includes oil or grease  spills. What can I do in the bathroom?  Use night lights.  Install grab bars by the toilet and in the tub and shower. Do not use towel bars as grab bars.  Use non-skid mats or decals in the tub or shower.  If you need to sit down in the shower, use a plastic, non-slip stool.  Keep the floor dry. Clean up any water that spills on the floor as soon as it happens.  Remove soap buildup in the tub or shower regularly.  Attach bath mats securely with double-sided non-slip rug tape.  Do not have throw rugs and other things on the floor that can make you trip. What can I do in the bedroom?  Use night lights.  Make sure that you have a light by your bed that is easy to reach.  Do not use any sheets or blankets that are too big for your bed. They should not hang down onto the floor.  Have a firm chair that has side arms. You can use this for support while you get dressed.  Do not have throw rugs and other things on the floor that can make you trip. What can I do in the kitchen?  Clean up any spills right away.  Avoid walking on wet floors.  Keep items that you use a lot in  easy-to-reach places.  If you need to reach something above you, use a strong step stool that has a grab bar.  Keep electrical cords out of the way.  Do not use floor polish or wax that makes floors slippery. If you must use wax, use non-skid floor wax.  Do not have throw rugs and other things on the floor that can make you trip. What can I do with my stairs?  Do not leave any items on the stairs.  Make sure that there are handrails on both sides of the stairs and use them. Fix handrails that are broken or loose. Make sure that handrails are as long as the stairways.  Check any carpeting to make sure that it is firmly attached to the stairs. Fix any carpet that is loose or worn.  Avoid having throw rugs at the top or bottom of the stairs. If you do have throw rugs, attach them to the floor with carpet  tape.  Make sure that you have a light switch at the top of the stairs and the bottom of the stairs. If you do not have them, ask someone to add them for you. What else can I do to help prevent falls?  Wear shoes that:  Do not have high heels.  Have rubber bottoms.  Are comfortable and fit you well.  Are closed at the toe. Do not wear sandals.  If you use a stepladder:  Make sure that it is fully opened. Do not climb a closed stepladder.  Make sure that both sides of the stepladder are locked into place.  Ask someone to hold it for you, if possible.  Clearly mark and make sure that you can see:  Any grab bars or handrails.  First and last steps.  Where the edge of each step is.  Use tools that help you move around (mobility aids) if they are needed. These include:  Canes.  Walkers.  Scooters.  Crutches.  Turn on the lights when you go into a dark area. Replace any light bulbs as soon as they burn out.  Set up your furniture so you have a clear path. Avoid moving your furniture around.  If any of your floors are uneven, fix them.  If there are any pets around you, be aware of where they are.  Review your medicines with your doctor. Some medicines can make you feel dizzy. This can increase your chance of falling. Ask your doctor what other things that you can do to help prevent falls. This information is not intended to replace advice given to you by your health care provider. Make sure you discuss any questions you have with your health care provider. Document Released: 03/19/2009 Document Revised: 10/29/2015 Document Reviewed: 06/27/2014 Elsevier Interactive Patient Education  2017 [employer].

## 2019-07-20 ENCOUNTER — Other Ambulatory Visit: Payer: Self-pay | Admitting: Family Medicine

## 2019-07-20 DIAGNOSIS — J42 Unspecified chronic bronchitis: Secondary | ICD-10-CM

## 2019-07-20 NOTE — Telephone Encounter (Signed)
Requested Prescriptions  Pending Prescriptions Disp Refills  . ipratropium-albuterol (DUONEB) 0.5-2.5 (3) MG/3ML SOLN [Pharmacy Med Name: IPRAT-ALBUT 0.5-3(2.5) MG/3 ML] 360 mL 0    Sig: TAKE 3MLS BY NEBULIZER EVERY 6 HOURS AS NEEDED     Pulmonology:  Combination Products Passed - 07/20/2019  9:47 AM      Passed - Valid encounter within last 12 months    Recent Outpatient Visits          1 month ago Centrilobular emphysema [place])   [facility] Steele Sizer, MD   4 months ago Centrilobular emphysema [facility])   [facility] Steele Sizer, MD   7 months ago Atherosclerosis of [facility])   [facility] Steele Sizer, MD   10 months ago Respiratory crackles at both lung bases   [facility] Steele Sizer, MD   10 months ago Centrilobular emphysema [facility])   [facility] Steele Sizer, MD      Future Appointments            In 1 month Ancil Boozer, Drue Stager, MD [facility], [place]   In 11 months  [facility]

## 2019-07-23 IMAGING — CT CT HEAD WITHOUT CONTRAST
3 of 8 series · 12 of 47 positions shown, 15 images · non-contrast
Comparison: MR Zavala on 08/29/2012

CLINICAL DATA: Ground level fall. Pt tripped over a throw rug and
struck her head on a dresser. Pt has a large L shaped laceration
(approx 3in by 9inch) to her right forehead. No LOC with this. Pt is
alert and oriented.

EXAM:
CT HEAD WITHOUT CONTRAST
CT CERVICAL SPINE WITHOUT CONTRAST
TECHNIQUE: Multidetector CT imaging of the head and cervical spine was
performed following the standard protocol without intravenous
contrast. Multiplanar CT image reconstructions of the cervical spine
were also generated.

[Series 6: coronal soft tissue · coronal · 0.32mm/px · 3 of 67 slices shown]
[im 19/67  bone]
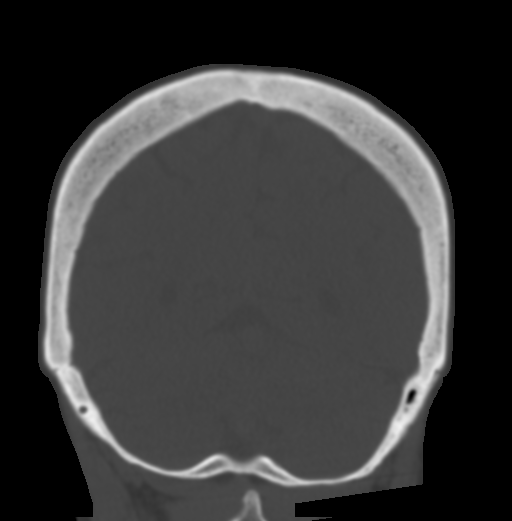
[im 29/67  bone]
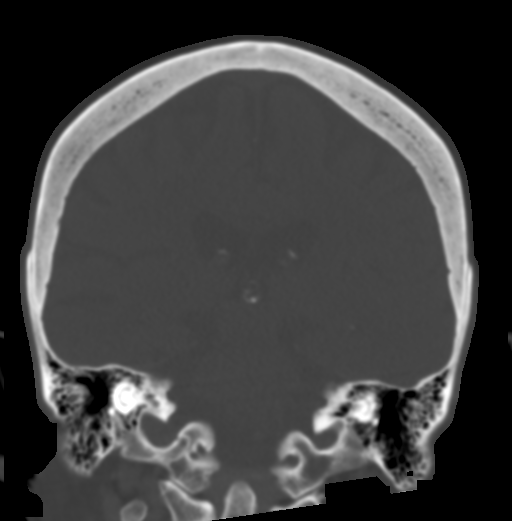
[im 38/67  bone]
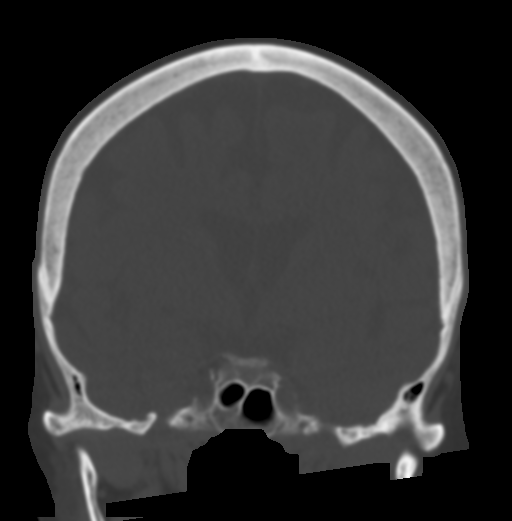

[Series 12: sagittal bone · sagittal · 0.21mm/px · 1 of 43 slices shown, 2 images]
[im 22/43  brain]
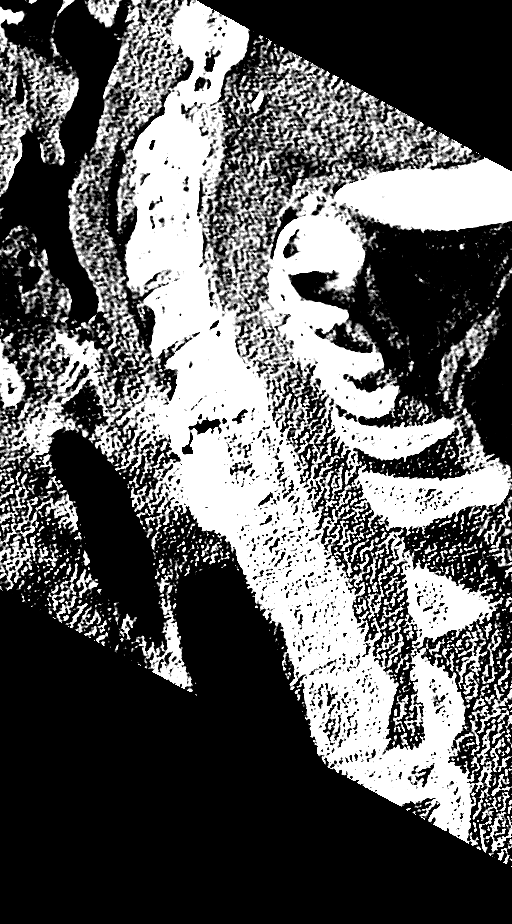
[im 22/43  bone]
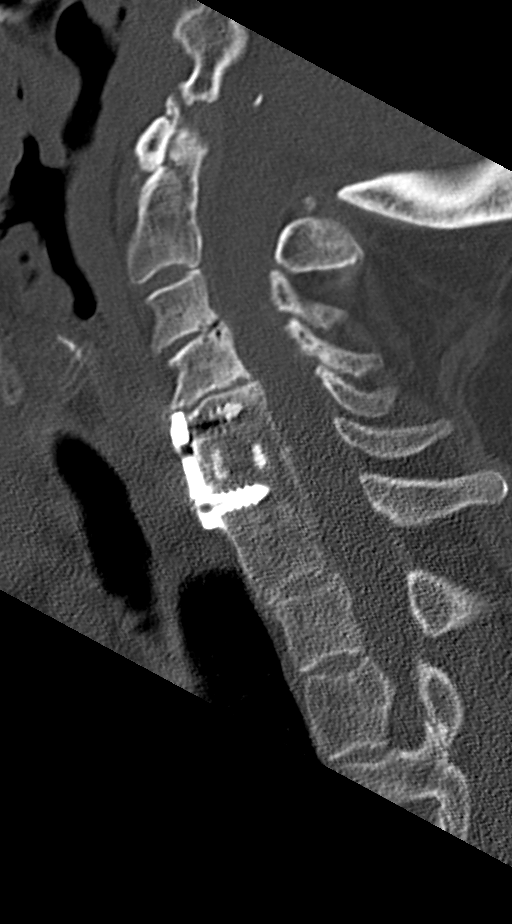

[Series 14: orthogonal bone · axial · 0.29mm/px · z∈[-221,-47]mm · 8 of 118 slices shown, 10 images]
[im 10/118  brain]
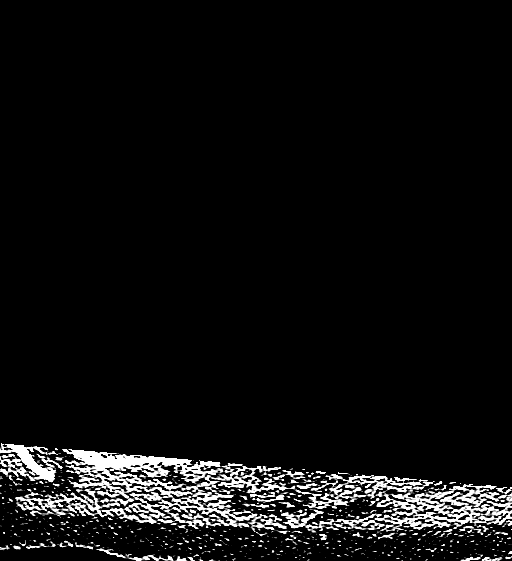
[im 10/118  bone]
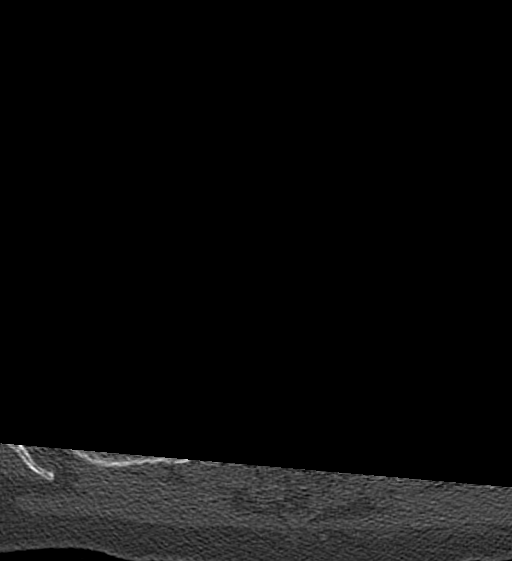
[im 28/118  bone]
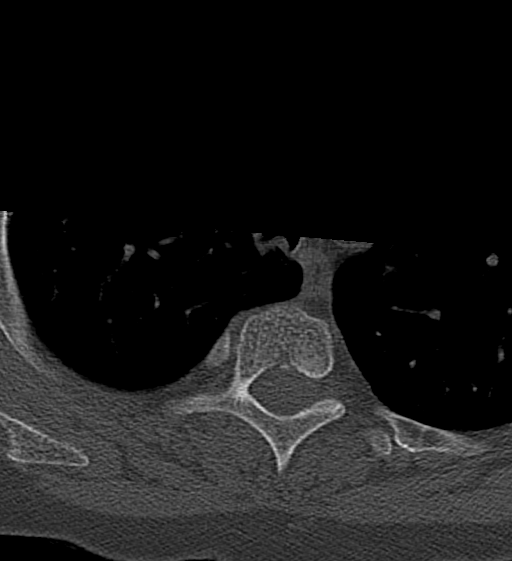
[im 37/118  bone]
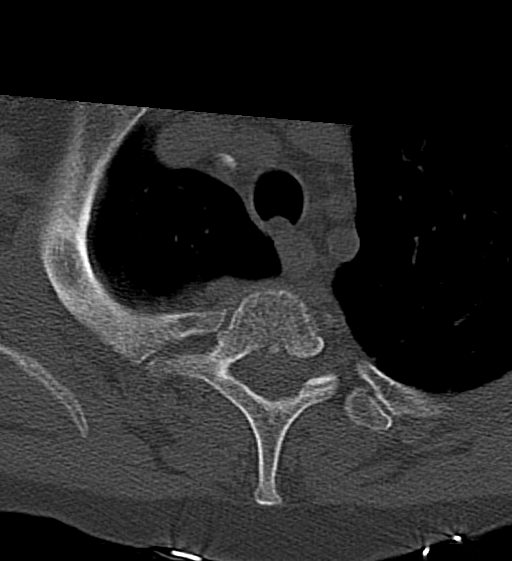
[im 55/118  bone]
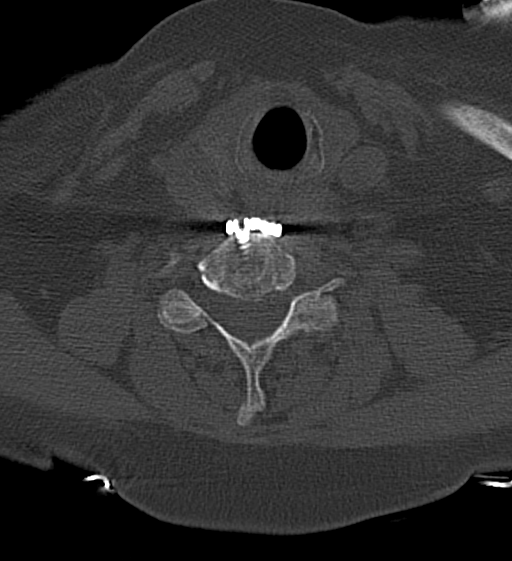
[im 64/118  brain]
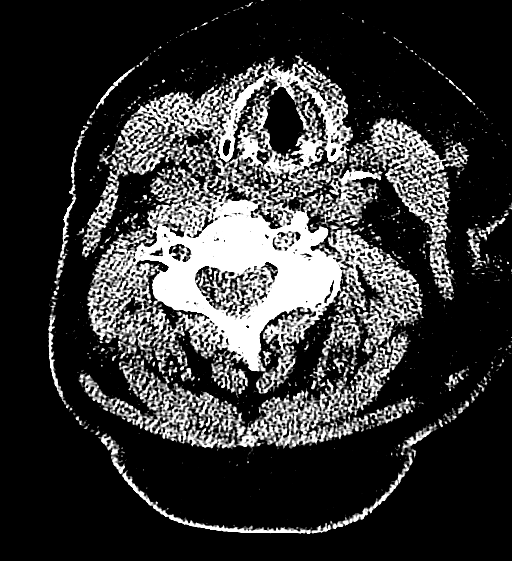
[im 64/118  bone]
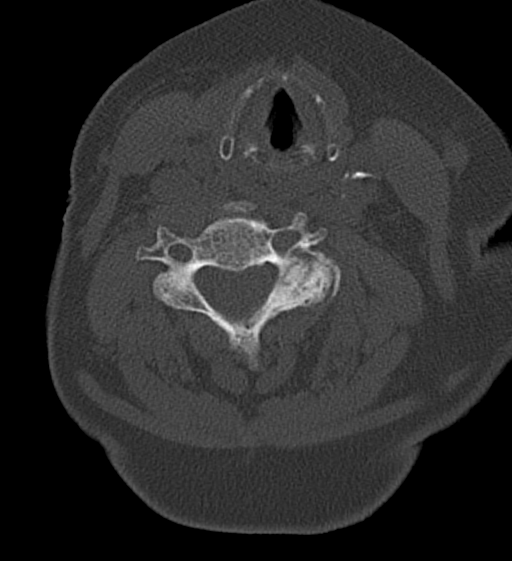
[im 82/118  bone]
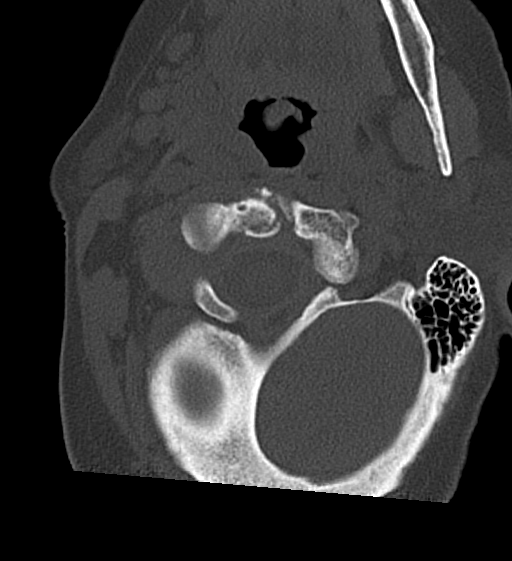
[im 91/118  bone]
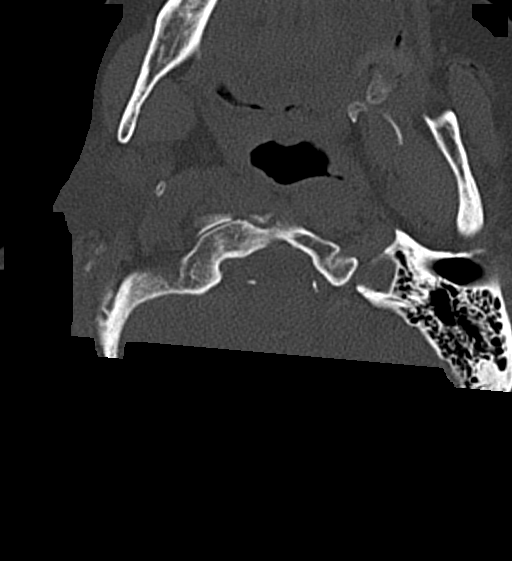
[im 109/118  bone]
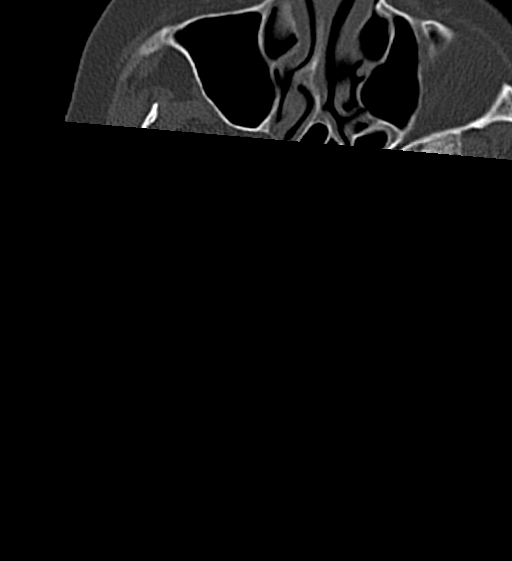

[12 of 47 positions shown; findings below may reference images not displayed]

FINDINGS: CT HEAD FINDINGS

Brain: No evidence of acute infarction, hemorrhage, hydrocephalus,
extra-axial collection or mass lesion/mass effect.

Vascular: There is atherosclerotic calcification of the internal
carotid arteries. No hyperdense vessel.

Skull: Normal. Negative for fracture or focal lesion.

Sinuses/Orbits: No acute fracture. There is mild preseptal orbital
thickening RIGHT greater than LEFT related to scalp laceration.
Globes are intact.

Other: Frontal scalp edema corresponding to the area of the
laceration.

CT CERVICAL SPINE FINDINGS

Alignment: There is convex RIGHT scoliosis of the cervical spine
associated with degenerative changes. Remote anterior fusion of C5-6
with interbody fusion device. C6 is also fused 2 C7, likely
congenital/developmental.

Skull base and vertebrae: There is an acute fracture through the
base of the odontoid, associated mild LEFT displacement of the
superior fragment.

Soft tissues and spinal canal: There is atherosclerotic
calcification of the carotid arteries. Slightly heterogeneous nodule
is identified along the posterior aspect of the RIGHT lobe of the
thyroid and measures 1.8 x 2.1 centimeters.

Disc levels:  Fused C5-C7.  Disc height loss at C3-4 and C4-5.

Upper chest: Negative.

Other: None
IMPRESSION: 1. Acute type 2 fracture of C2 with minimal displacement.
2. No acute intracranial abnormality.
3. RIGHT forehead laceration without underlying fracture.
4. Edema from the scalp laceration extends into the preseptal
regions of both orbits. Globes are intact.
5. 2.1 centimeter nodule of the RIGHT lobe of the thyroid warranting
further evaluation with ultrasound when appropriate.

Critical Value/emergent results were called by telephone at the time
of interpretation on 08/16/2018 at [DATE] to Dr. BONJER ANDREW ,
who verbally acknowledged these results.

## 2019-08-05 ENCOUNTER — Telehealth: Payer: Self-pay | Admitting: Family Medicine

## 2019-08-05 NOTE — Telephone Encounter (Signed)
Request for tramadol refill. Not on med list.

## 2019-08-05 NOTE — Telephone Encounter (Signed)
Copied from [place] [id number]. Topic: Quick Communication - Rx Refill/Question >> Aug 05, 2019  3:37 PM Leward Quan A wrote: Medication: traMADol (ULTRAM) 50 MG tablet   Has the patient contacted their pharmacy? Yes.   (Agent: If no, request that the patient contact the pharmacy for the refill.) (Agent: If yes, when and what did the pharmacy advise?)  Preferred Pharmacy (with phone number or street name): [id number] [place] [address]  Phone:  [phone] Fax:  [phone]     Agent: Please be advised that RX refills may take up to 3 business days. We ask that you follow-up with your pharmacy.

## 2019-08-06 NOTE — Telephone Encounter (Signed)
Left detailed message for patient to call her pain medication physician to fill this. She was given enough per Dr. Ancil Boozer to hold her over until her visit with them.  "Based on her last note 01/08. She was given 30 days of tramadol to last until her visit with the pain clinic."

## 2019-08-15 ENCOUNTER — Other Ambulatory Visit: Payer: Self-pay | Admitting: Family Medicine

## 2019-08-15 DIAGNOSIS — J42 Unspecified chronic bronchitis: Secondary | ICD-10-CM

## 2019-08-16 ENCOUNTER — Other Ambulatory Visit: Payer: Self-pay | Admitting: Family Medicine

## 2019-08-16 DIAGNOSIS — I10 Essential (primary) hypertension: Secondary | ICD-10-CM

## 2019-08-28 ENCOUNTER — Telehealth: Payer: Self-pay

## 2019-08-28 DIAGNOSIS — Z87891 Personal history of nicotine dependence: Secondary | ICD-10-CM

## 2019-08-28 DIAGNOSIS — R918 Other nonspecific abnormal finding of lung field: Secondary | ICD-10-CM

## 2019-08-28 NOTE — Telephone Encounter (Signed)
Patient has been notified that the low dose lung cancer screening CT scan is due currently or will be in near future.  Confirmed that patient is within the appropriate age range and asymptomatic, (no signs or symptoms of lung cancer).  Patient denies illness that would prevent curative treatment for lung cancer if found.  Verified smoking history (current smoker 0.5 ppd, with 45 year history).  Patient is agreeable for CT scan being scheduled.  Prefers Wednesday afternoon appointment.

## 2019-08-30 NOTE — Addendum Note (Signed)
Addended by: Lieutenant Diego on: 08/30/2019 01:46 PM   Modules accepted: Orders

## 2019-08-30 NOTE — Telephone Encounter (Signed)
Smoking history: current smoker, 53 pack year

## 2019-09-02 NOTE — Telephone Encounter (Signed)
Patient will not be able to go for the low dose lund cancer screening CT scan scheduled for 09/04/19 at 2pm.  Please reschedule for next Wednesday.

## 2019-09-04 ENCOUNTER — Ambulatory Visit: Payer: Medicare Other

## 2019-09-05 NOTE — Telephone Encounter (Signed)
Message left informing patient of low dose lung cancer screening CT scan appointment on 09/11/19 @ 1:00.

## 2019-09-10 NOTE — Telephone Encounter (Signed)
Message left informing patient of low dose lung cancer screening CT scan appointment on 09/11/19 @ 1:00.

## 2019-09-11 ENCOUNTER — Ambulatory Visit: Admission: RE | Admit: 2019-09-11 | Payer: Medicare Other | Source: Ambulatory Visit

## 2019-09-13 ENCOUNTER — Other Ambulatory Visit: Payer: Self-pay | Admitting: Family Medicine

## 2019-09-13 DIAGNOSIS — J42 Unspecified chronic bronchitis: Secondary | ICD-10-CM

## 2019-09-14 ENCOUNTER — Other Ambulatory Visit: Payer: Self-pay | Admitting: Family Medicine

## 2019-09-14 DIAGNOSIS — I10 Essential (primary) hypertension: Secondary | ICD-10-CM

## 2019-09-14 NOTE — Telephone Encounter (Signed)
Requested Prescriptions  Pending Prescriptions Disp Refills  . losartan (COZAAR) 50 MG tablet [Pharmacy Med Name: LOSARTAN POTASSIUM 50 MG TAB] 90 tablet 0    Sig: TAKE 1 TABLET BY MOUTH EVERY DAY     Cardiovascular:  Angiotensin Receptor Blockers Failed - 09/14/2019  9:40 AM      Failed - Cr in normal range and within 180 days    Creat  Date Value Ref Range Status  03/13/2019 0.66 0.50 - 0.99 mg/dL Final    Comment:    For patients >69 years of age, the reference limit for Creatinine is approximately 13% higher for people identified as African-American. .          Failed - K in normal range and within 180 days    Potassium  Date Value Ref Range Status  03/13/2019 4.6 3.5 - 5.3 mmol/L Final  06/05/2013 3.2 (L) 3.5 - 5.1 mmol/L Final         Failed - Last BP in normal range    BP Readings from Last 1 Encounters:  06/14/19 (!) 137/111         Passed - Patient is not pregnant      Passed - Valid encounter within last 6 months    Recent Outpatient Visits          3 months ago Centrilobular emphysema [facility])   [facility] Steele Sizer, MD   6 months ago Centrilobular emphysema [facility])   [facility] Steele Sizer, MD   9 months ago Atherosclerosis of aorta [facility])   [facility] Steele Sizer, MD   1 year ago Respiratory crackles at both lung bases   [facility] Steele Sizer, MD   1 year ago Centrilobular emphysema [facility])   [facility] Steele Sizer, MD      Future Appointments            In 3 days Steele Sizer, MD [facility], [place]   In 9 months  [facility], [facility]

## 2019-09-17 ENCOUNTER — Encounter: Payer: Self-pay | Admitting: Family Medicine

## 2019-09-17 ENCOUNTER — Other Ambulatory Visit: Payer: Self-pay

## 2019-09-17 ENCOUNTER — Ambulatory Visit (INDEPENDENT_AMBULATORY_CARE_PROVIDER_SITE_OTHER): Payer: Medicare Other | Admitting: Family Medicine

## 2019-09-17 VITALS — BP 120/74 | HR 88 | Temp 97.9°F | Resp 16 | Ht 60.0 in | Wt 120.9 lb

## 2019-09-17 DIAGNOSIS — I2584 Coronary atherosclerosis due to calcified coronary lesion: Secondary | ICD-10-CM

## 2019-09-17 DIAGNOSIS — D692 Other nonthrombocytopenic purpura: Secondary | ICD-10-CM | POA: Diagnosis not present

## 2019-09-17 DIAGNOSIS — M542 Cervicalgia: Secondary | ICD-10-CM

## 2019-09-17 DIAGNOSIS — K219 Gastro-esophageal reflux disease without esophagitis: Secondary | ICD-10-CM

## 2019-09-17 DIAGNOSIS — I2510 Atherosclerotic heart disease of native coronary artery without angina pectoris: Secondary | ICD-10-CM | POA: Diagnosis not present

## 2019-09-17 DIAGNOSIS — J3089 Other allergic rhinitis: Secondary | ICD-10-CM | POA: Diagnosis not present

## 2019-09-17 DIAGNOSIS — I700 Atherosclerosis of aorta: Secondary | ICD-10-CM

## 2019-09-17 DIAGNOSIS — J432 Centrilobular emphysema: Secondary | ICD-10-CM | POA: Diagnosis not present

## 2019-09-17 DIAGNOSIS — F3342 Major depressive disorder, recurrent, in full remission: Secondary | ICD-10-CM

## 2019-09-17 DIAGNOSIS — D649 Anemia, unspecified: Secondary | ICD-10-CM | POA: Diagnosis not present

## 2019-09-17 DIAGNOSIS — G8929 Other chronic pain: Secondary | ICD-10-CM | POA: Diagnosis not present

## 2019-09-17 DIAGNOSIS — I10 Essential (primary) hypertension: Secondary | ICD-10-CM | POA: Diagnosis not present

## 2019-09-17 LAB — CBC WITH DIFFERENTIAL/PLATELET
Absolute Monocytes: 697 cells/uL (ref 200–950)
Basophils Absolute: 82 cells/uL (ref 0–200)
Basophils Relative: 1 %
Eosinophils Absolute: 254 cells/uL (ref 15–500)
Eosinophils Relative: 3.1 %
HCT: 38.6 % (ref 35.0–45.0)
Hemoglobin: 12.1 g/dL (ref 11.7–15.5)
Lymphs Abs: 2673 cells/uL (ref 850–3900)
MCH: 26.7 pg — ABNORMAL LOW (ref 27.0–33.0)
MCHC: 31.3 g/dL — ABNORMAL LOW (ref 32.0–36.0)
MCV: 85 fL (ref 80.0–100.0)
MPV: 10.1 fL (ref 7.5–12.5)
Monocytes Relative: 8.5 %
Neutro Abs: 4494 cells/uL (ref 1500–7800)
Neutrophils Relative %: 54.8 %
Platelets: 284 10*3/uL (ref 140–400)
RBC: 4.54 10*6/uL (ref 3.80–5.10)
RDW: 15.8 % — ABNORMAL HIGH (ref 11.0–15.0)
Total Lymphocyte: 32.6 %
WBC: 8.2 10*3/uL (ref 3.8–10.8)

## 2019-09-17 LAB — IRON,TIBC AND FERRITIN PANEL
%SAT: 13 % (calc) — ABNORMAL LOW (ref 16–45)
Ferritin: 20 ng/mL (ref 16–288)
Iron: 55 ug/dL (ref 45–160)
TIBC: 409 mcg/dL (calc) (ref 250–450)

## 2019-09-17 MED ORDER — CITALOPRAM HYDROBROMIDE 20 MG PO TABS: 20.0000 mg | ORAL_TABLET | Freq: Every day | ORAL | 1 refills | Status: DC

## 2019-09-17 MED ORDER — IPRATROPIUM-ALBUTEROL 0.5-2.5 (3) MG/3ML IN SOLN: 3.0000 mL | RESPIRATORY_TRACT | 5 refills | Status: DC | PRN

## 2019-09-17 MED ORDER — LORATADINE 10 MG PO TABS: 10.0000 mg | ORAL_TABLET | Freq: Every day | ORAL | 0 refills | Status: DC

## 2019-09-17 MED ORDER — METOPROLOL SUCCINATE ER 25 MG PO TB24: 25.0000 mg | ORAL_TABLET | Freq: Every day | ORAL | 1 refills | Status: DC

## 2019-09-17 MED ORDER — LOSARTAN POTASSIUM 50 MG PO TABS: 50.0000 mg | ORAL_TABLET | Freq: Every day | ORAL | 1 refills | Status: DC

## 2019-09-17 MED ORDER — ATORVASTATIN CALCIUM 20 MG PO TABS: 20.0000 mg | ORAL_TABLET | Freq: Every day | ORAL | 0 refills | Status: DC

## 2019-09-17 MED ORDER — PANTOPRAZOLE SODIUM 40 MG PO TBEC: 40.0000 mg | DELAYED_RELEASE_TABLET | Freq: Every day | ORAL | 1 refills | Status: DC

## 2019-09-17 MED ORDER — CELECOXIB 200 MG PO CAPS: 200.0000 mg | ORAL_CAPSULE | ORAL | 1 refills | Status: DC | PRN

## 2019-09-17 NOTE — Progress Notes (Signed)
Name: Terri Burns   MRN: [id number]    DOB: 08-09-1950   Date:09/17/2019       Progress Note  Subjective  Chief Complaint  Chief Complaint  Patient presents with  . Hypertension    follow up  . Hyperlipidemia  . Depression    HPI  Chronic neck pain:she has been on disability since 2007,  had x-ray andMRI done, she has DDD.She fell March 2020 and fracture her c-spine at C2 level, she went to [facility] just EC, but she fell down again a few days later and had to be admitted because of severe hypotension , hypoxia and had COPD exacerbation. She continues to haveconstantneck pain, wearing a neck color, she is taking ibuprofen otc, we switched to Celebrex but it was too expensive, we will try Kristopher Oppenheim with [facility] coupon She was referred to pain clinic and was seeing Dr. Cari Caraway at [facility] . She is off hydrocodone. She states pain right now is 4/10 , at home her pain can go up to 8/10 with activity. She needs to take breaks when doing something around her house   HPV positive: explained that she needs to return for pap smear-however because of neck problems she would like to hold off on that. Unchanged   HTN: she is back on medications and bp is at goal today. No  chest pain or palpitatoin   COPD : CT showed emphysema, shehad a flare March 2020. She denies SOB,she has daily coughing, unable to afford Trelegy and has been using neb solution 4 times a day and seems to help however very costly, we will try with good rx She recently got approved for Medicaid but not for the medication part   Major depression: shewas in remission, taking citalopram, however lost her daughter May 2020 and was grieving, she is feeling better now, cutting down on cigarettes. States not crying anymore , she gets frustrated because she cannot do as much as she used to, but not sad  Atherosclerosis aorta and coronary vessels, seen on CT chest : aspirin and atorvastatin as  recommended  Anemia unsp:  We will recheck labs, she states she shipped cologuard but results not back   Patient Active Problem List   Diagnosis Date Noted  . Acute respiratory failure with hypoxemia ([place]) 08/20/2018  . Skin lesion of face 05/01/2018  . Atherosclerosis of aorta ([place]) 03/04/2018  . Coronary artery calcification of native artery 03/04/2018  . 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 Chronic pain 01/01/2018  . Benign essential HTN 01/01/2018  . Senile purpura ([place]) 01/01/2018  . Recurrent major depressive disorder, in full remission ([place]) 01/01/2018  . Hyperglycemia 09/27/2016  . Tobacco abuse 08/20/2015  . Allergic rhinitis, seasonal 05/12/2015  . Emphysema lung ([place]) 05/12/2015  . Degeneration of intervertebral disc of cervical region 05/12/2015  . GERD (gastroesophageal reflux disease) 05/12/2015  . Major depression in remission ([place]) 05/12/2015    Past Surgical History:  Procedure Laterality Date  . NECK SURGERY N/A 10/2003  . SHOULDER SURGERY Left 2004    Family History  Problem Relation Age of Onset  . Hypertension Mother   . Stroke Mother   . Heart disease Father   . Diabetes Father   . Heart disease Daughter   . Pulmonary embolism Daughter   . Hypertension Son     Social History   Tobacco Use  . Smoking status: Current Every Day Smoker    Packs/day: 0.50    Years:  45.00    Pack years: 22.50    Types: Cigarettes    Start date: 12/10/1973  . Smokeless tobacco: Never Used  . Tobacco comment: previous pack a day smoker  Substance Use Topics  . Alcohol use: Yes    Alcohol/week: 0.0 standard drinks    Comment: occasionally     Current Outpatient Medications:  .  albuterol (VENTOLIN HFA) 108 (90 Base) MCG/ACT inhaler, Inhale 2 puffs into the lungs every 6 (six) hours as needed for wheezing or shortness of breath., Disp: 18 Inhaler, Rfl: 2 .  aspirin 81 MG EC tablet, TAKE 1 TABLET BY MOUTH EVERY DAY, Disp: 90 tablet, Rfl: 0 .   atorvastatin (LIPITOR) 20 MG tablet, Take 1 tablet (20 mg total) by mouth daily., Disp: 90 tablet, Rfl: 0 .  celecoxib (CELEBREX) 200 MG capsule, Take 1 capsule (200 mg total) by mouth as needed., Disp: 90 capsule, Rfl: 0 .  citalopram (CELEXA) 20 MG tablet, Take 1 tablet (20 mg total) by mouth daily., Disp: 90 tablet, Rfl: 1 .  fluticasone (FLONASE) 50 MCG/ACT nasal spray, SPRAY 2 SPRAYS INTO EACH NOSTRIL EVERY DAY, Disp: 16 mL, Rfl: 0 .  ipratropium-albuterol (DUONEB) 0.5-2.5 (3) MG/3ML SOLN, TAKE 3MLS BY NEBULIZER EVERY 6 HOURS AS NEEDED, Disp: 360 mL, Rfl: 0 .  losartan (COZAAR) 50 MG tablet, TAKE 1 TABLET BY MOUTH EVERY DAY, Disp: 90 tablet, Rfl: 0 .  metoprolol succinate (TOPROL-XL) 25 MG 24 hr tablet, TAKE 1 TABLET BY MOUTH EVERY DAY, Disp: 90 tablet, Rfl: 0 .  pantoprazole (PROTONIX) 40 MG tablet, Take 1 tablet (40 mg total) by mouth daily., Disp: 90 tablet, Rfl: 0 .  loratadine (CLARITIN) 10 MG tablet, TAKE 1 TABLET BY MOUTH EVERY DAY (Patient not taking: Reported on 07/09/2019), Disp: 90 tablet, Rfl: 0  Allergies  Allergen Reactions  . Levofloxacin Shortness Of Breath  . Penicillins Hives    itching  . Meloxicam     dizziness  . Nsaids Other (See Comments)    Patient states she can tolerate up to three doses per day without incident    I personally reviewed active problem list, medication list, allergies, family history, social history, health maintenance with the patient/caregiver today.   ROS  Constitutional: Negative for fever or weight change.  Respiratory: positive  for cough and shortness of breath - stable.   Cardiovascular: Negative for chest pain or palpitations.  Gastrointestinal: Negative for abdominal pain, no bowel changes.  Musculoskeletal: Negative for gait problem or joint swelling.  Skin: Negative for rash.  Neurological: Negative for dizziness or headache.  No other specific complaints in a complete review of systems (except as listed in HPI  above).  Objective  Vitals:   09/17/19 1509  BP: 120/74  Pulse: 88  Resp: 16  Temp: 97.9 F (36.6 C)  TempSrc: Temporal  SpO2: 93%  Weight: 120 lb 14.4 oz (54.8 kg)  Height: 5' (1.524 m)    Body mass index is 23.61 kg/m.  Physical Exam  Constitutional: Patient appears well-developed and well-nourished. No distress.  HEENT: head atraumatic, normocephalic, pupils equal and reactive to light Cardiovascular: Normal rate, regular rhythm and normal heart sounds.  No murmur heard. No BLE edema. Pulmonary/Chest: Effort normal and breath sounds normal. No respiratory distress. Abdominal: Soft.  There is no tenderness. Psychiatric: Patient has a normal mood and affect. behavior is normal. Judgment and thought content normal. Muscular Skeletal: wearing a neck brace   PHQ2/9: Depression screen [facility] 2/9 09/17/2019 07/09/2019 07/09/2019 06/14/2019  03/13/2019  Decreased Interest 0 0 0 0 1  Down, Depressed, Hopeless 0 0 0 0 0  PHQ - 2 Score 0 0 0 0 1  Altered sleeping 0 - - 0 1  Tired, decreased energy 0 - - 0 1  Change in appetite 0 - - 0 0  Feeling bad or failure about yourself  0 - - 0 0  Trouble concentrating 0 - - 0 0  Moving slowly or fidgety/restless 0 - - 0 0  Suicidal thoughts 0 - - 0 0  PHQ-9 Score 0 - - 0 3  Difficult doing work/chores Not difficult at all - - Not difficult at all Somewhat difficult  Some recent data might be hidden    phq 9 is negative   Fall Risk: Fall Risk  09/17/2019 07/09/2019 06/14/2019 03/13/2019 12/11/2018  Falls in the past year? 0 1 1 1 1   Number falls in past yr: 0 1 1 1 1   Injury with Fall? 0 1 1 1 1   Comment - - - Broke a vertebrae in her neck C2 Broken Neck and put a hole in her head  Risk for fall due to : - History of fall(s);Impaired vision;Orthopedic patient - History of fall(s);Impaired balance/gait Impaired balance/gait;Other (Comment);Medication side effect  Risk for fall due to: Comment - - - - Dizzy and BP was too low  Follow up Falls  evaluation completed Falls prevention discussed - - -     Functional Status Survey: Is the patient deaf or have difficulty hearing?: No Does the patient have difficulty seeing, even when wearing glasses/contacts?: No Does the patient have difficulty concentrating, remembering, or making decisions?: No Does the patient have difficulty walking or climbing stairs?: No Does the patient have difficulty dressing or bathing?: No Does the patient have difficulty doing errands alone such as visiting a doctor's office or shopping?: No   Assessment & Plan  1. Atherosclerosis of aorta (HCC)  - atorvastatin (LIPITOR) 20 MG tablet; Take 1 tablet (20 mg total) by mouth daily.  Dispense: 90 tablet; Refill: 0  2. Senile purpura ([place])  Today on right arm   3. Centrilobular emphysema ([place])  Needs repeat CT lung   4. Essential hypertension  - metoprolol succinate (TOPROL-XL) 25 MG 24 hr tablet; Take 1 tablet (25 mg total) by mouth daily.  Dispense: 90 tablet; Refill: 1 - losartan (COZAAR) 50 MG tablet; Take 1 tablet (50 mg total) by mouth daily.  Dispense: 90 tablet; Refill: 1  5. Perennial allergic rhinitis  - loratadine (CLARITIN) 10 MG tablet; Take 1 tablet (10 mg total) by mouth daily.  Dispense: 90 tablet; Refill: 0  6. Coronary artery calcification of native artery  - atorvastatin (LIPITOR) 20 MG tablet; Take 1 tablet (20 mg total) by mouth daily.  Dispense: 90 tablet; Refill: 0  7. Chronic neck pain  - celecoxib (CELEBREX) 200 MG capsule; Take 1 capsule (200 mg total) by mouth as needed.  Dispense: 90 capsule; Refill: 1  8. Gastroesophageal reflux disease without esophagitis  - pantoprazole (PROTONIX) 40 MG tablet; Take 1 tablet (40 mg total) by mouth daily.  Dispense: 90 tablet; Refill: 1  9. Recurrent major depressive disorder, in full remission ([place])  - citalopram (CELEXA) 20 MG tablet; Take 1 tablet (20 mg total) by mouth daily.  Dispense: 90 tablet; Refill: 1

## 2019-09-26 ENCOUNTER — Other Ambulatory Visit: Payer: Self-pay

## 2019-09-26 ENCOUNTER — Ambulatory Visit
Admission: RE | Admit: 2019-09-26 | Discharge: 2019-09-26 | Disposition: A | Discharge status: Home / Self Care | Payer: Medicare Other | Source: Ambulatory Visit | Attending: Oncology | Admitting: Oncology

## 2019-09-26 DIAGNOSIS — R918 Other nonspecific abnormal finding of lung field: Secondary | ICD-10-CM

## 2019-09-26 DIAGNOSIS — Z87891 Personal history of nicotine dependence: Secondary | ICD-10-CM

## 2019-09-26 DIAGNOSIS — J439 Emphysema, unspecified: Secondary | ICD-10-CM | POA: Diagnosis not present

## 2019-10-01 ENCOUNTER — Encounter: Payer: Self-pay | Admitting: *Deleted

## 2019-10-17 DIAGNOSIS — Z23 Encounter for immunization: Secondary | ICD-10-CM | POA: Diagnosis not present

## 2019-10-28 ENCOUNTER — Telehealth: Payer: Self-pay | Admitting: Family Medicine

## 2019-10-28 NOTE — Chronic Care Management (AMB) (Signed)
  Chronic Care Management   Note  10/28/2019 Name: Terri Burns MRN: [id number] DOB: 1950-09-29  ATHENA BALTZ is a 69 y.o. year old female who is a primary care patient of Steele Sizer, MD. I reached out to [name] by phone today in response to a referral sent by Ms. Azucena Cecil Jankowski's health plan.     Ms. Sandstrom was given information about Chronic Care Management services today including:  1. CCM service includes personalized support from designated clinical staff supervised by her physician, including individualized plan of care and coordination with other care providers 2. 24/7 contact phone numbers for assistance for urgent and routine care needs. 3. Service will only be billed when office clinical staff spend 20 minutes or more in a month to coordinate care. 4. Only one practitioner may furnish and bill the service in a calendar month. 5. The patient may stop CCM services at any time (effective at the end of the month) by phone call to the office staff. 6. The patient will be responsible for cost sharing (co-pay) of up to 20% of the service fee (after annual deductible is met).  Patient agreed to services and verbal consent obtained.   Follow up plan: Telephone appointment with care management team member scheduled for:11/26/2019.  [place], [place] [phone] Direct Dial: [phone] [email or web address]: [email or web address]

## 2019-11-14 DIAGNOSIS — Z23 Encounter for immunization: Secondary | ICD-10-CM | POA: Diagnosis not present

## 2019-11-26 ENCOUNTER — Other Ambulatory Visit: Payer: Self-pay

## 2019-11-26 ENCOUNTER — Ambulatory Visit: Payer: Medicare Other | Admitting: Pharmacist

## 2019-11-26 DIAGNOSIS — Z720 Tobacco use: Secondary | ICD-10-CM

## 2019-11-26 DIAGNOSIS — I700 Atherosclerosis of aorta: Secondary | ICD-10-CM

## 2019-11-26 NOTE — Patient Instructions (Addendum)
Visit Information  Goals Addressed            This Visit's Progress   . Chronic Care Management       CARE PLAN ENTRY (see longitudinal plan of care for additional care plan information)  Current Barriers:  . Chronic Disease Management support, education, and care coordination needs related to Hypertension, Hyperlipidemia, COPD, and Tobacco use   Hypertension BP Readings from Last 3 Encounters:  09/17/19 120/74  06/14/19 (!) 137/111  03/13/19 (!) 152/78   . Pharmacist Clinical Goal(s): o Over the next 90 days, patient will work with PharmD and providers to achieve BP goal <140/90 . Current regimen:  o Losartan 50mg  daily o Toprol-XL 25mg  daily  . Interventions: o Reducing cigarettes and smoking cessation to reduce blood pressure . Patient self care activities - Over the next 90 days, patient will: o Check BP daily, document, and provide at future appointments o Decrease daily cigarettes o Ensure daily salt intake < 2300 mg/day  Hyperlipidemia Lab Results  Component Value Date/Time   LDLCALC 47 03/13/2019 12:00 AM   . Pharmacist Clinical Goal(s): o Over the next 90 days, patient will work with PharmD and providers to maintain LDL goal < 100 . Current regimen:  o Lipitor 20mg  daily . Interventions: o None . Patient self care activities - Over the next 90 days, patient will: o Increase consumption of lean meats, fruits and vegetables  COPD . Pharmacist Clinical Goal(s) o Over the next 90 days, patient will work with PharmD and providers to improve quality of maintenance inhalers using full LIS insurance . Current regimen:  o DuoNeb four times daily minimum  . Interventions: o Try new insurance on Trelegy and other beneficial name brand mes o Call the [place] Quitline for smoking cessation  . Patient self care activities - Over the next 90 days, patient will: o Reduce daily cigarettes o Call 1800QUITNOW  Medication management . Pharmacist Clinical Goal(s): o Over  the next 90 days, patient will work with PharmD and providers to achieve optimal medication adherence . [facility] . Interventions o Comprehensive medication review performed. o Utilize [facility] for medication synchronization, packaging and delivery . Patient self care activities - Over the next 90 days, patient will: o Focus on medication adherence by working with PharmD to transition pharmacies  o Take medications as prescribed o Report any questions or concerns to PharmD and/or provider(s)  Initial goal documentation        Ms. Nop was given information about Chronic Care Management services today including:  1. CCM service includes personalized support from designated clinical staff supervised by her physician, including individualized plan of care and coordination with other care providers 2. 24/7 contact phone numbers for assistance for urgent and routine care needs. 3. Standard insurance, coinsurance, copays and deductibles apply for chronic care management only during months in which we provide at least 20 minutes of these services. Most insurances cover these services at 100%, however patients may be responsible for any copay, coinsurance and/or deductible if applicable. This service may help you avoid the need for more expensive face-to-face services. 4. Only one practitioner may furnish and bill the service in a calendar month. 5. The patient may stop CCM services at any time (effective at the end of the month) by phone call to the office staff.  Patient agreed to services and verbal consent obtained.   Print copy of patient instructions provided.  Telephone follow up appointment with pharmacy team member  scheduled for: 3 months  Milus Height, PharmD, [place], [facility] [phone] Coping with Quitting Smoking  Quitting smoking is a physical and mental challenge. You will face cravings, withdrawal symptoms, and  temptation. Before quitting, work with your health care provider to make a plan that can help you cope. Preparation can help you quit and keep you from giving in. How can I cope with cravings? Cravings usually last for 5-10 minutes. If you get through it, the craving will pass. Consider taking the following actions to help you cope with cravings:  Keep your mouth busy: ? Chew sugar-free gum. ? Suck on hard candies or a straw. ? Brush your teeth.  Keep your hands and body busy: ? Immediately change to a different activity when you feel a craving. ? Squeeze or play with a ball. ? Do an activity or a hobby, like making bead jewelry, practicing needlepoint, or working with wood. ? Mix up your normal routine. ? Take a short exercise break. Go for a quick walk or run up and down stairs. ? Spend time in public places where smoking is not allowed.  Focus on doing something kind or helpful for someone else.  Call a friend or family member to talk during a craving.  Join a support group.  Call a quit line, such as 1-800-QUIT-NOW.  Talk with your health care provider about medicines that might help you cope with cravings and make quitting easier for you. How can I deal with withdrawal symptoms? Your body may experience negative effects as it tries to get used to not having nicotine in the system. These effects are called withdrawal symptoms. They may include:  Feeling hungrier than normal.  Trouble concentrating.  Irritability.  Trouble sleeping.  Feeling depressed.  Restlessness and agitation.  Craving a cigarette. To manage withdrawal symptoms:  Avoid places, people, and activities that trigger your cravings.  Remember why you want to quit.  Get plenty of sleep.  Avoid coffee and other caffeinated drinks. These may worsen some of your symptoms. How can I handle social situations? Social situations can be difficult when you are quitting smoking, especially in the first few  weeks. To manage this, you can:  Avoid parties, bars, and other social situations where people might be smoking.  Avoid alcohol.  Leave right away if you have the urge to smoke.  Explain to your family and friends that you are quitting smoking. Ask for understanding and support.  Plan activities with friends or family where smoking is not an option. What are some ways I can cope with stress? Wanting to smoke may cause stress, and stress can make you want to smoke. Find ways to manage your stress. Relaxation techniques can help. For example:  Breathe slowly and deeply, in through your nose and out through your mouth.  Listen to soothing, relaxing music.  Talk with a family member or friend about your stress.  Light a candle.  Soak in a bath or take a shower.  Think about a peaceful place. What are some ways I can prevent weight gain? Be aware that many people gain weight after they quit smoking. However, not everyone does. To keep from gaining weight, have a plan in place before you quit and stick to the plan after you quit. Your plan should include:  Having healthy snacks. When you have a craving, it may help to: ? Eat plain popcorn, crunchy carrots, celery, or other cut vegetables. ? Chew sugar-free gum.  Changing how you eat: ? Eat small portion sizes at meals. ? Eat 4-6 small meals throughout the day instead of 1-2 large meals a day. ? Be mindful when you eat. Do not watch television or do other things that might distract you as you eat.  Exercising regularly: ? Make time to exercise each day. If you do not have time for a long workout, do short bouts of exercise for 5-10 minutes several times a day. ? Do some form of strengthening exercise, like weight lifting, and some form of aerobic exercise, like running or swimming.  Drinking plenty of water or other low-calorie or no-calorie drinks. Drink 6-8 glasses of water daily, or as much as instructed by your health care  provider. Summary  Quitting smoking is a physical and mental challenge. You will face cravings, withdrawal symptoms, and temptation to smoke again. Preparation can help you as you go through these challenges.  You can cope with cravings by keeping your mouth busy (such as by chewing gum), keeping your body and hands busy, and making calls to family, friends, or a helpline for people who want to quit smoking.  You can cope with withdrawal symptoms by avoiding places where people smoke, avoiding drinks with caffeine, and getting plenty of rest.  Ask your health care provider about the different ways to prevent weight gain, avoid stress, and handle social situations. This information is not intended to replace advice given to you by your health care provider. Make sure you discuss any questions you have with your health care provider. Document Revised: 05/05/2017 Document Reviewed: 05/20/2016 Elsevier Patient Education  2020 [employer].

## 2019-11-26 NOTE — Chronic Care Management (AMB) (Signed)
Chronic Care Management Pharmacy  Name: Terri Burns  MRN: [id number] DOB: 06/13/1950  Chief Complaint/ HPI  Terri Burns,  69 y.o. , female presents for their Initial CCM visit with the clinical pharmacist via telephone due to COVID-19 Pandemic.  PCP : Steele Sizer, MD  Their chronic conditions include: HTN, HLD, depression, tobacco  Office Visits: 4/13 CAD, [place], spine fx, referred to pain clinic [place], HPV positive, Medicaid without Rx?, Trelegy $$$, nebs qid, lost daughter May 2020, COPD coughing daily, cutting cig  Consult Visit: NA  Medications: Outpatient Encounter Medications as of 11/26/2019  Medication Sig Note  . aspirin 81 MG EC tablet TAKE 1 TABLET BY MOUTH EVERY DAY   . atorvastatin (LIPITOR) 20 MG tablet Take 1 tablet (20 mg total) by mouth daily.   . celecoxib (CELEBREX) 200 MG capsule Take 1 capsule (200 mg total) by mouth as needed.   . citalopram (CELEXA) 20 MG tablet Take 1 tablet (20 mg total) by mouth daily.   . fluticasone (FLONASE) 50 MCG/ACT nasal spray SPRAY 2 SPRAYS INTO EACH NOSTRIL EVERY DAY   . ipratropium-albuterol (DUONEB) 0.5-2.5 (3) MG/3ML SOLN Inhale 3 mLs into the lungs every 4 (four) hours as needed.   . loratadine (CLARITIN) 10 MG tablet Take 1 tablet (10 mg total) by mouth daily.   Marland Kitchen losartan (COZAAR) 50 MG tablet Take 1 tablet (50 mg total) by mouth daily.   . metoprolol succinate (TOPROL-XL) 25 MG 24 hr tablet Take 1 tablet (25 mg total) by mouth daily.   . pantoprazole (PROTONIX) 40 MG tablet Take 1 tablet (40 mg total) by mouth daily.   Marland Kitchen albuterol (VENTOLIN HFA) 108 (90 Base) MCG/ACT inhaler Inhale 2 puffs into the lungs every 6 (six) hours as needed for wheezing or shortness of breath. (Patient not taking: Reported on 11/26/2019) 03/20/2019: Nebulizers    No facility-administered encounter medications on file as of 11/26/2019.     Financial Resource Strain: High Risk  . Difficulty of Paying Living Expenses: Hard    Current Diagnosis/Assessment:  Goals Addressed            This Visit's Progress   . Chronic Care Management       CARE PLAN ENTRY (see longitudinal plan of care for additional care plan information)  Current Barriers:  . Chronic Disease Management support, education, and care coordination needs related to Hypertension, Hyperlipidemia, COPD, and Tobacco use   Hypertension BP Readings from Last 3 Encounters:  09/17/19 120/74  06/14/19 (!) 137/111  03/13/19 (!) 152/78   . Pharmacist Clinical Goal(s): o Over the next 90 days, patient will work with PharmD and providers to achieve BP goal <140/90 . Current regimen:  o Losartan 18m daily o Toprol-XL 262mdaily  . Interventions: o Reducing cigarettes and smoking cessation to reduce blood pressure . Patient self care activities - Over the next 90 days, patient will: o Check BP daily, document, and provide at future appointments o Decrease daily cigarettes o Ensure daily salt intake < 2300 mg/day  Hyperlipidemia Lab Results  Component Value Date/Time   LDLCALC 47 03/13/2019 12:00 AM   . Pharmacist Clinical Goal(s): o Over the next 90 days, patient will work with PharmD and providers to maintain LDL goal < 100 . Current regimen:  o Lipitor 2015maily . Interventions: o None . Patient self care activities - Over the next 90 days, patient will: o Increase consumption of lean meats, fruits and vegetables  COPD . Pharmacist Clinical Goal(s)  o Over the next 90 days, patient will work with PharmD and providers to improve quality of maintenance inhalers using full LIS insurance . Current regimen:  o DuoNeb four times daily minimum  . Interventions: o Try new insurance on Trelegy and other beneficial name brand mes o Call the [place] Quitline for smoking cessation  . Patient self care activities - Over the next 90 days, patient will: o Reduce daily cigarettes o Call 1800QUITNOW  Medication management . Pharmacist Clinical  Goal(s): o Over the next 90 days, patient will work with PharmD and providers to achieve optimal medication adherence . [facility] . Interventions o Comprehensive medication review performed. o Utilize [facility] for medication synchronization, packaging and delivery . Patient self care activities - Over the next 90 days, patient will: o Focus on medication adherence by working with PharmD to transition pharmacies  o Take medications as prescribed o Report any questions or concerns to PharmD and/or provider(s)  Initial goal documentation       COPD / Asthma / Tobacco   Eosinophil count:   Lab Results  Component Value Date/Time   EOSPCT 3.1 09/17/2019 03:45 PM   EOSPCT 0.1 06/05/2013 06:49 AM  %                               Eos (Absolute):  Lab Results  Component Value Date/Time   EOSABS 254 09/17/2019 03:45 PM   EOSABS 0.0 06/05/2013 06:49 AM    Tobacco Status:  Social History   Tobacco Use  Smoking Status Current Every Day Smoker  . Packs/day: 0.50  . Years: 45.00  . Pack years: 22.50  . Types: Cigarettes  . Start date: 12/10/1973  Smokeless Tobacco Never Used  Tobacco Comment   previous pack a day smoker    Patient has failed these meds in past: NA Patient is currently uncontrolled on the following medications: DuoNeb Using maintenance inhaler regularly? No Frequency of rescue inhaler use:  1-2x per week  We discussed:   Duoneb q6h prn (qid minimum), once daily with Trelegy  Needs Flonase in fall Cigarettes per day? 10 Ready to quit Daily cough  Plan  Call [place] [facility] ([email or web address]) Take new insurance (dual LIS) to get Trelegy Continue current medications  Hypertension   Office blood pressures are  BP Readings from Last 3 Encounters:  09/17/19 120/74  06/14/19 (!) 137/111  03/13/19 (!) 152/78   Losartan, metoprolol  Patient has failed these meds in the past: NA  Patient checks BP at home infrequently  Patient home BP  readings are ranging: NA  We discussed  Pain increasing blood pressure Has wrist cuff which she admits is not accurate   Plan  Continue current medications   Hyperlipidemia   LDL goal < 100  Lipid Panel     Component Value Date/Time   CHOL 142 03/13/2019 0000   TRIG 37 03/13/2019 0000   HDL 84 03/13/2019 0000   LDLCALC 47 03/13/2019 0000    Hepatic Function Latest Ref Rng & Units 03/13/2019 08/30/2018 11/22/2017  Total Protein 6.1 - 8.1 g/dL 6.8 5.9(L) 6.8  Albumin 3.6 - 5.1 g/dL - - -  AST 10 - 35 U/L 28 33 20  ALT 6 - 29 U/L 24 30(H) 17  Alk Phosphatase 33 - 130 U/L - - -  Total Bilirubin 0.2 - 1.2 mg/dL 0.4 0.3 0.3     The 10-year ASCVD risk score Mikey Bussing  DC Jr., et al., 2013) is: 10.6%   Values used to calculate the score:     Age: 76 years     Sex: Female     Is Non-Hispanic African American: No     Diabetic: No     Tobacco smoker: Yes     Systolic Blood Pressure: 759 mmHg     Is BP treated: Yes     HDL Cholesterol: 84 mg/dL     Total Cholesterol: 142 mg/dL   Patient has failed these meds in past: NA Patient is currently controlled on the following medications:  . Lipitor 27m daily  We discussed:   At goal Denies myalgias  Plan  Continue current medications   Medication Management   [facility] Uses pill box? Yes Pt endorses 100% compliance Interested in [facility]  We discussed:  Paying more [facility] use [facility]. [facility] preferred [employer] hasn't tried, likely full LIS  Metoprolol losartan ac breakfast Protonix  With breakfast (working well), failed Prilosec Lipitor qpm Celebrex at [id number] big, cut into thirds Centrum complete gummy (sometimes) Long narrow tabs work best  [employer]ior and spatial designeror medication synchronization, packaging and delivery  Verbal consent obtained for [facility] enhanced pharmacy services (medication synchronization, adherence packaging, delivery  coordination). A medication sync plan was created to allow patient to get all medications delivered once every 30 to 90 days per patient preference. Patient understands they have freedom to choose pharmacy and clinical pharmacist will coordinate care between all prescribers and [facility].   Follow up: 3 month phone visit  TeMilus HeightPharmD, [phone]

## 2019-11-28 ENCOUNTER — Other Ambulatory Visit: Payer: Self-pay | Admitting: Family Medicine

## 2019-11-28 MED ORDER — TRELEGY ELLIPTA 100-62.5-25 MCG/INH IN AEPB: 1.0000 | INHALATION_SPRAY | Freq: Every day | RESPIRATORY_TRACT | 2 refills | Status: DC

## 2019-11-29 ENCOUNTER — Other Ambulatory Visit: Payer: Self-pay | Admitting: Family Medicine

## 2019-11-29 MED ORDER — TRELEGY ELLIPTA 100-62.5-25 MCG/INH IN AEPB: 1.0000 | INHALATION_SPRAY | Freq: Every day | RESPIRATORY_TRACT | 2 refills | Status: DC

## 2019-12-08 ENCOUNTER — Other Ambulatory Visit: Payer: Self-pay | Admitting: Family Medicine

## 2019-12-08 DIAGNOSIS — I10 Essential (primary) hypertension: Secondary | ICD-10-CM

## 2019-12-08 NOTE — Telephone Encounter (Signed)
Requested Prescriptions  Pending Prescriptions Disp Refills  . losartan (COZAAR) 50 MG tablet [Pharmacy Med Name: LOSARTAN POTASSIUM 50 MG TAB] 90 tablet 1    Sig: TAKE 1 TABLET BY MOUTH EVERY DAY     Cardiovascular:  Angiotensin Receptor Blockers Failed - 12/08/2019  9:28 AM      Failed - Cr in normal range and within 180 days    Creat  Date Value Ref Range Status  03/13/2019 0.66 0.50 - 0.99 mg/dL Final    Comment:    For patients >68 years of age, the reference limit for Creatinine is approximately 13% higher for people identified as African-American. .          Failed - K in normal range and within 180 days    Potassium  Date Value Ref Range Status  03/13/2019 4.6 3.5 - 5.3 mmol/L Final  06/05/2013 3.2 (L) 3.5 - 5.1 mmol/L Final         Passed - Patient is not pregnant      Passed - Last BP in normal range    BP Readings from Last 1 Encounters:  09/17/19 120/74         Passed - Valid encounter within last 6 months    Recent Outpatient Visits          2 months ago Atherosclerosis of aorta [facility])   [facility] Steele Sizer, MD   5 months ago Centrilobular emphysema [facility])   [facility] Steele Sizer, MD   9 months ago Centrilobular emphysema [facility])   [facility] Steele Sizer, MD   12 months ago Atherosclerosis of aorta [facility])   [facility] Steele Sizer, MD   1 year ago Respiratory crackles at both lung bases   [facility] Steele Sizer, MD      Future Appointments            In 4 weeks Steele Sizer, MD [facility], [place]   In 7 months  [facility], [facility]

## 2019-12-12 ENCOUNTER — Other Ambulatory Visit: Payer: Self-pay | Admitting: Family Medicine

## 2019-12-12 DIAGNOSIS — I10 Essential (primary) hypertension: Secondary | ICD-10-CM

## 2019-12-30 ENCOUNTER — Other Ambulatory Visit: Payer: Self-pay | Admitting: Family Medicine

## 2019-12-30 NOTE — Telephone Encounter (Signed)
Requested medication (s) are due for refill today: no  Requested medication (s) are on the active medication list: yes  Last refill:  11/29/2019  Future visit scheduled: yes  Notes to clinic:  Medication not assigned to a protocol, review manually   Requested Prescriptions  Pending Prescriptions Disp [place] 100-62.5-25 MCG/INH AEPB [Pharmacy Med Name: TRELEGY ELLIPTA 100-62.5-25] 60 each 2    Sig: TAKE 1 PUFF BY MOUTH EVERY DAY      Off-Protocol Failed - 12/30/2019  1:25 PM      Failed - Medication not assigned to a protocol, review manually.      Passed - Valid encounter within last 12 months    Recent Outpatient Visits           3 months ago Atherosclerosis of aorta [facility])   [facility] Steele Sizer, MD   6 months ago Centrilobular emphysema [facility])   [facility] Steele Sizer, MD   9 months ago Centrilobular emphysema [facility])   [facility] Steele Sizer, MD   1 year ago Atherosclerosis of aorta [facility])   [facility] Steele Sizer, MD   1 year ago Respiratory crackles at both lung bases   [facility] Steele Sizer, MD       Future Appointments             In 1 week Steele Sizer, MD [facility], [place]   In 6 months  [place]

## 2019-12-31 ENCOUNTER — Ambulatory Visit: Payer: Self-pay | Admitting: Pharmacist

## 2019-12-31 NOTE — Chronic Care Management (AMB) (Signed)
   Chronic Care Management Pharmacy  Name: Terri Burns  MRN: [id number] DOB: 10-04-1950  Chief Complaint/ HPI  Terri Burns,  69 y.o. , female presents for their Pharmacy CCM visit with the clinical pharmacist via telephone due to COVID-19 Pandemic.  PCP : Steele Sizer, MD  Medications: Outpatient Encounter Medications as of 12/31/2019  Medication Sig Note  . albuterol (VENTOLIN HFA) 108 (90 Base) MCG/ACT inhaler Inhale 2 puffs into the lungs every 6 (six) hours as needed for wheezing or shortness of breath. (Patient not taking: Reported on 11/26/2019) 03/20/2019: Nebulizers   . aspirin 81 MG EC tablet TAKE 1 TABLET BY MOUTH EVERY DAY   . atorvastatin (LIPITOR) 20 MG tablet Take 1 tablet (20 mg total) by mouth daily.   . celecoxib (CELEBREX) 200 MG capsule Take 1 capsule (200 mg total) by mouth as needed.   . citalopram (CELEXA) 20 MG tablet Take 1 tablet (20 mg total) by mouth daily.   . fluticasone (FLONASE) 50 MCG/ACT nasal spray SPRAY 2 SPRAYS INTO EACH NOSTRIL EVERY DAY   . Fluticasone-Umeclidin-Vilant (TRELEGY ELLIPTA) 100-62.5-25 MCG/INH AEPB Inhale 1 puff into the lungs daily.   Marland Kitchen ipratropium-albuterol (DUONEB) 0.5-2.5 (3) MG/3ML SOLN Inhale 3 mLs into the lungs every 4 (four) hours as needed.   . loratadine (CLARITIN) 10 MG tablet Take 1 tablet (10 mg total) by mouth daily.   Marland Kitchen losartan (COZAAR) 50 MG tablet TAKE 1 TABLET BY MOUTH EVERY DAY   . metoprolol succinate (TOPROL-XL) 25 MG 24 hr tablet TAKE 1 TABLET BY MOUTH EVERY DAY   . pantoprazole (PROTONIX) 40 MG tablet Take 1 tablet (40 mg total) by mouth daily.    No facility-administered encounter medications on file as of 12/31/2019.     Current Diagnosis/Assessment:  Goals Addressed   None     Medication Management   Pt uses [facility] for all medications Uses pill box? Yes Pt endorses 90% compliance  We discussed:  Set up delivery  Just got citalopram 90 days still at pharmacy? evening with  Lipitor Metoprolol 90 days with pantoprazole 7/8  Losartan 7/4 90 days with pantoprazole Pantoprazole 5/25 90 days before breakfast Lipitor after supper Celebrex not needed now Has enough albuterol and nebs Trelegy tomorrow 30 day supply said got 60 but only got 30 Loratadine with other morning meds 5/25 90 days Atorvastatin 20mg  daily 05/27/19 has #6 left (has skipped a lot)   Plan  Utilize [facility] pharmacy for medication synchronization, packaging and delivery

## 2020-01-06 ENCOUNTER — Ambulatory Visit: Payer: Medicare Other | Admitting: Family Medicine

## 2020-01-06 ENCOUNTER — Other Ambulatory Visit: Payer: Self-pay | Admitting: Family Medicine

## 2020-01-06 DIAGNOSIS — J3089 Other allergic rhinitis: Secondary | ICD-10-CM

## 2020-01-06 DIAGNOSIS — I2510 Atherosclerotic heart disease of native coronary artery without angina pectoris: Secondary | ICD-10-CM

## 2020-01-06 DIAGNOSIS — I700 Atherosclerosis of aorta: Secondary | ICD-10-CM

## 2020-01-06 MED ORDER — FLUTICASONE PROPIONATE 50 MCG/ACT NA SUSP: 2.0000 | Freq: Every day | NASAL | 0 refills | Status: DC

## 2020-01-06 MED ORDER — LORATADINE 10 MG PO TABS: 10.0000 mg | ORAL_TABLET | Freq: Every day | ORAL | 0 refills | Status: DC

## 2020-01-06 MED ORDER — ATORVASTATIN CALCIUM 20 MG PO TABS: 20.0000 mg | ORAL_TABLET | Freq: Every day | ORAL | 0 refills | Status: DC

## 2020-02-06 ENCOUNTER — Other Ambulatory Visit: Payer: Self-pay | Admitting: Family Medicine

## 2020-02-06 DIAGNOSIS — Z13820 Encounter for screening for osteoporosis: Secondary | ICD-10-CM

## 2020-02-06 DIAGNOSIS — E2839 Other primary ovarian failure: Secondary | ICD-10-CM

## 2020-02-06 DIAGNOSIS — Z1231 Encounter for screening mammogram for malignant neoplasm of breast: Secondary | ICD-10-CM

## 2020-02-19 NOTE — Progress Notes (Signed)
Name: Terri Burns   MRN: [id number]    DOB: 1951-05-17   Date:02/24/2020       Progress Note  Subjective  Chief Complaint  Follow up   HPI   Chronic neck pain:she has been on disability since 2007,  had x-ray andMRI done, she has DDD.She fell March 2020 and fracture her c-spine at C2 level, she went to [facility] just [facility], but she fell down again a few days later and had to be admitted because of severe hypotension , hypoxia and had COPD exacerbation. She continues to haveconstantneck pain, wearing a neck color, she is off ibuprofen, getting Celebrex now that she has Medicaid. She was referred to pain clinic but states only seen by  Dr. Cari Caraway at [facility], never got an appointment with pain clinic . She is off hydrocodone, she is willing to try lyrica again . She states pain right now is 6/10 , average pain is 4/10 described as aching and sometimes can be sharp, sometimes it burns, not radiating down arms.   Senile purpura: on aspirin, given reassurance   HPV positive: explained that she needs to return for pap smear-however because of neck problems she would like to hold off on that.She states not interested on that   HTN: she has been taking medications as prescribed, she states in more pain than usual today, also a little anxious because she needs to pack to go spend 2 weeks at son's house dog sitting . No  chest pain or palpitation   COPD : CT showed emphysema, shehad a flare March 2020. She denies SOB,she has daily coughing, she has been on Trelegy now and neg therapy as needed. She is doing better now   Major depression: shewas in remission, taking citalopram, however lost her daughter May 2020 and was grieving, she is feeling better now, recently lost sister in law and mother in law but coping well. Seems to be connect in her apartment, has a close friend that she talks to daily and still participate on church services remotely   Atherosclerosis aorta and  coronary vessels, seen on CT chest : aspirin and atorvastatin, we will recheck labs, denies side effects     Patient Active Problem List   Diagnosis Date Noted  . Skin lesion of face 05/01/2018  . Atherosclerosis of aorta ([place]) 03/04/2018  . Coronary artery calcification of native artery 03/04/2018  . 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 Chronic neck pain 01/01/2018  . Benign essential HTN 01/01/2018  . Senile purpura ([place]) 01/01/2018  . Recurrent major depressive disorder, in full remission ([place]) 01/01/2018  . Hyperglycemia 09/27/2016  . Tobacco abuse 08/20/2015  . Allergic rhinitis, seasonal 05/12/2015  . Emphysema lung ([place]) 05/12/2015  . Degeneration of intervertebral disc of cervical region 05/12/2015  . GERD (gastroesophageal reflux disease) 05/12/2015  . Major depression in remission ([place]) 05/12/2015    Past Surgical History:  Procedure Laterality Date  . NECK SURGERY N/A 10/2003  . SHOULDER SURGERY Left 2004    Family History  Problem Relation Age of Onset  . Hypertension Mother   . Stroke Mother   . Heart disease Father   . Diabetes Father   . Heart disease Daughter   . Pulmonary embolism Daughter   . Hypertension Son     Social History   Tobacco Use  . Smoking status: Current Every Day Smoker    Packs/day: 0.60    Years: 45.00    Pack years: 27.00  Types: Cigarettes    Start date: 12/10/1973  . Smokeless tobacco: Never Used  . Tobacco comment: previous pack a day smoker  Substance Use Topics  . Alcohol use: Yes    Alcohol/week: 0.0 standard drinks    Comment: occasionally     Current Outpatient Medications:  .  albuterol (VENTOLIN HFA) 108 (90 Base) MCG/ACT inhaler, Inhale 2 puffs into the lungs every 6 (six) hours as needed for wheezing or shortness of breath., Disp: 18 Inhaler, Rfl: 2 .  aspirin 81 MG EC tablet, TAKE 1 TABLET BY MOUTH EVERY DAY, Disp: 90 tablet, Rfl: 0 .  atorvastatin (LIPITOR) 20 MG tablet, Take 1 tablet (20 mg  total) by mouth daily., Disp: 90 tablet, Rfl: 1 .  celecoxib (CELEBREX) 200 MG capsule, Take 1 capsule (200 mg total) by mouth as needed., Disp: 90 capsule, Rfl: 1 .  citalopram (CELEXA) 20 MG tablet, Take 1 tablet (20 mg total) by mouth daily., Disp: 90 tablet, Rfl: 1 .  fluticasone (FLONASE) 50 MCG/ACT nasal spray, Place 2 sprays into both nostrils daily., Disp: 48 mL, Rfl: 0 .  Fluticasone-Umeclidin-Vilant (TRELEGY ELLIPTA) 100-62.5-25 MCG/INH AEPB, Take 1 puff by mouth daily., Disp: 60 each, Rfl: 5 .  ipratropium-albuterol (DUONEB) 0.5-2.5 (3) MG/3ML SOLN, Inhale 3 mLs into the lungs every 4 (four) hours as needed., Disp: 120 mL, Rfl: 5 .  loratadine (CLARITIN) 10 MG tablet, Take 1 tablet (10 mg total) by mouth daily., Disp: 90 tablet, Rfl: 0 .  losartan (COZAAR) 50 MG tablet, TAKE 1 TABLET BY MOUTH EVERY DAY, Disp: 90 tablet, Rfl: 1 .  metoprolol succinate (TOPROL-XL) 25 MG 24 hr tablet, Take 1 tablet (25 mg total) by mouth daily., Disp: 90 tablet, Rfl: 1 .  pantoprazole (PROTONIX) 40 MG tablet, Take 1 tablet (40 mg total) by mouth daily., Disp: 90 tablet, Rfl: 1  Allergies  Allergen Reactions  . Levofloxacin Shortness Of Breath  . Penicillins Hives    itching  . Meloxicam     dizziness  . Nsaids Other (See Comments)    Patient states she can tolerate up to three doses per day without incident    I personally reviewed active problem list, medication list, allergies, family history, social history, health maintenance with the patient/caregiver today.   ROS  Constitutional: Negative for fever or weight change.  Respiratory: Negative for cough and shortness of breath.   Cardiovascular: Negative for chest pain or palpitations.  Gastrointestinal: Negative for abdominal pain, no bowel changes.  Musculoskeletal: Negative for gait problem or joint swelling.  Skin: Negative for rash.  Neurological: Negative for dizziness or headache.  No other specific complaints in a complete review of  systems (except as listed in HPI above).   Objective  Vitals:   02/21/20 1350  BP: (!) 200/112  Pulse: 76  Resp: 16  Temp: 98.8 F (37.1 C)  TempSrc: Oral  SpO2: 98%  Weight: 125 lb 3.2 oz (56.8 kg)  Height: 5' (1.524 m)    Body mass index is 24.45 kg/m.  Physical Exam  Constitutional: Patient appears well-developed and well-nourished.  No distress.  HEENT: head atraumatic, normocephalic, pupils equal and reactive to light,neck supple Cardiovascular: Normal rate, regular rhythm and normal heart sounds.  No murmur heard. No BLE edema. Pulmonary/Chest: Effort normal and breath sounds normal. No respiratory distress. Abdominal: Soft.  There is no tenderness. Skin: to erythematous patches, raised and white spots on right temporal area Muscular skeletal: neck brace  Psychiatric: Patient has a normal mood  and affect. behavior is normal. Judgment and thought content normal.  PHQ2/9: Depression screen [facility] 2/9 02/21/2020 09/17/2019 07/09/2019 07/09/2019 06/14/2019  Decreased Interest 0 0 0 0 0  Down, Depressed, Hopeless 0 0 0 0 0  PHQ - 2 Score 0 0 0 0 0  Altered sleeping 0 0 - - 0  Tired, decreased energy 1 0 - - 0  Change in appetite 0 0 - - 0  Feeling bad or failure about yourself  0 0 - - 0  Trouble concentrating 0 0 - - 0  Moving slowly or fidgety/restless 0 0 - - 0  Suicidal thoughts 0 0 - - 0  PHQ-9 Score 1 0 - - 0  Difficult doing work/chores Not difficult at all Not difficult at all - - Not difficult at all  Some recent data might be hidden    phq 9 is negative   Fall Risk: Fall Risk  02/21/2020 09/17/2019 07/09/2019 06/14/2019 03/13/2019  Falls in the past year? 0 0 1 1 1   Number falls in past yr: 0 0 1 1 1   Injury with Fall? 0 0 1 1 1   Comment - - - - Broke a vertebrae in her neck C2  Risk for fall due to : - - History of fall(s);Impaired vision;Orthopedic patient - History of fall(s);Impaired balance/gait  Risk for fall due to: Comment - - - - -  Follow up - Falls  evaluation completed Falls prevention discussed - -     Functional Status Survey: Is the patient deaf or have difficulty hearing?: No Does the patient have difficulty seeing, even when wearing glasses/contacts?: No Does the patient have difficulty concentrating, remembering, or making decisions?: No Does the patient have difficulty walking or climbing stairs?: Yes Does the patient have difficulty dressing or bathing?: No Does the patient have difficulty doing errands alone such as visiting a doctor's office or shopping?: Yes    Assessment & Plan  1. Recurrent major depressive disorder, in full remission ([place])  - citalopram (CELEXA) 20 MG tablet; Take 1 tablet (20 mg total) by mouth daily.  Dispense: 90 tablet; Refill: 1  2. Need for immunization against influenza  Today   3. Colon cancer screening  - POCT Occult Blood Stool  4. Osteoporosis screening   5. Need for vaccination for pneumococcus  Today   6. Encounter for screening mammogram for breast cancer   7. Atherosclerosis of aorta (H[facility])  - Lipid panel - atorvastatin (LIPITOR) 20 MG tablet; Take 1 tablet (20 mg total) by mouth daily.  Dispense: 90 tablet; Refill: 1  8. Coronary artery calcification of native artery  - atorvastatin (LIPITOR) 20 MG tablet; Take 1 tablet (20 mg total) by mouth daily.  Dispense: 90 tablet; Refill: 1  9. Chronic neck pain  - celecoxib (CELEBREX) 200 MG capsule; Take 1 capsule (200 mg total) by mouth as needed.  Dispense: 90 capsule; Refill: 1  10. Essential hypertension  - COMPLETE METABOLIC PANEL WITH GFR - CBC with Differential/Platelet - metoprolol succinate (TOPROL-XL) 25 MG 24 hr tablet; Take 1 tablet (25 mg total) by mouth daily.  Dispense: 90 tablet; Refill: 1  11. Gastroesophageal reflux disease without esophagitis  - pantoprazole (PROTONIX) 40 MG tablet; Take 1 tablet (40 mg total) by mouth daily.  Dispense: 90 tablet; Refill: 1  12. Ovarian failure  - DG Bone  Density; Future  13. Skin lesion of face  - Ambulatory referral to Dermatology

## 2020-02-20 ENCOUNTER — Ambulatory Visit: Payer: Self-pay

## 2020-02-21 ENCOUNTER — Other Ambulatory Visit: Payer: Self-pay

## 2020-02-21 ENCOUNTER — Ambulatory Visit (INDEPENDENT_AMBULATORY_CARE_PROVIDER_SITE_OTHER): Payer: Medicare Other | Admitting: Family Medicine

## 2020-02-21 ENCOUNTER — Encounter: Payer: Self-pay | Admitting: Family Medicine

## 2020-02-21 VITALS — BP 200/112 | HR 76 | Temp 98.8°F | Resp 16 | Ht 60.0 in | Wt 125.2 lb

## 2020-02-21 DIAGNOSIS — F3342 Major depressive disorder, recurrent, in full remission: Secondary | ICD-10-CM | POA: Diagnosis not present

## 2020-02-21 DIAGNOSIS — Z13820 Encounter for screening for osteoporosis: Secondary | ICD-10-CM | POA: Diagnosis not present

## 2020-02-21 DIAGNOSIS — I10 Essential (primary) hypertension: Secondary | ICD-10-CM

## 2020-02-21 DIAGNOSIS — M542 Cervicalgia: Secondary | ICD-10-CM | POA: Diagnosis not present

## 2020-02-21 DIAGNOSIS — L989 Disorder of the skin and subcutaneous tissue, unspecified: Secondary | ICD-10-CM

## 2020-02-21 DIAGNOSIS — I2510 Atherosclerotic heart disease of native coronary artery without angina pectoris: Secondary | ICD-10-CM

## 2020-02-21 DIAGNOSIS — Z1211 Encounter for screening for malignant neoplasm of colon: Secondary | ICD-10-CM

## 2020-02-21 DIAGNOSIS — E2839 Other primary ovarian failure: Secondary | ICD-10-CM | POA: Diagnosis not present

## 2020-02-21 DIAGNOSIS — Z23 Encounter for immunization: Secondary | ICD-10-CM

## 2020-02-21 DIAGNOSIS — G8929 Other chronic pain: Secondary | ICD-10-CM

## 2020-02-21 DIAGNOSIS — D692 Other nonthrombocytopenic purpura: Secondary | ICD-10-CM

## 2020-02-21 DIAGNOSIS — K219 Gastro-esophageal reflux disease without esophagitis: Secondary | ICD-10-CM

## 2020-02-21 DIAGNOSIS — I700 Atherosclerosis of aorta: Secondary | ICD-10-CM

## 2020-02-21 DIAGNOSIS — I2584 Coronary atherosclerosis due to calcified coronary lesion: Secondary | ICD-10-CM

## 2020-02-21 DIAGNOSIS — Z1231 Encounter for screening mammogram for malignant neoplasm of breast: Secondary | ICD-10-CM

## 2020-02-21 MED ORDER — ATORVASTATIN CALCIUM 20 MG PO TABS: 20.0000 mg | ORAL_TABLET | Freq: Every day | ORAL | 1 refills | Status: DC

## 2020-02-21 MED ORDER — TRELEGY ELLIPTA 100-62.5-25 MCG/INH IN AEPB
1.0000 | INHALATION_SPRAY | Freq: Every day | RESPIRATORY_TRACT | 5 refills | Status: DC
Start: 2020-02-21 — End: 2020-08-12

## 2020-02-21 MED ORDER — CITALOPRAM HYDROBROMIDE 20 MG PO TABS: 20.0000 mg | ORAL_TABLET | Freq: Every day | ORAL | 1 refills | Status: DC

## 2020-02-21 MED ORDER — PANTOPRAZOLE SODIUM 40 MG PO TBEC: 40.0000 mg | DELAYED_RELEASE_TABLET | Freq: Every day | ORAL | 1 refills | Status: DC

## 2020-02-21 MED ORDER — CELECOXIB 200 MG PO CAPS: 200.0000 mg | ORAL_CAPSULE | ORAL | 1 refills | Status: DC | PRN

## 2020-02-21 MED ORDER — METOPROLOL SUCCINATE ER 25 MG PO TB24: 25.0000 mg | ORAL_TABLET | Freq: Every day | ORAL | 1 refills | Status: DC

## 2020-02-22 LAB — COMPLETE METABOLIC PANEL WITH GFR
AG Ratio: 1.6 (calc) (ref 1.0–2.5)
ALT: 19 U/L (ref 6–29)
AST: 22 U/L (ref 10–35)
Albumin: 4.2 g/dL (ref 3.6–5.1)
Alkaline phosphatase (APISO): 74 U/L (ref 37–153)
BUN: 14 mg/dL (ref 7–25)
CO2: 25 mmol/L (ref 20–32)
Calcium: 9.1 mg/dL (ref 8.6–10.4)
Chloride: 104 mmol/L (ref 98–110)
Creat: 0.69 mg/dL (ref 0.50–0.99)
GFR, Est African American: 104 mL/min/{1.73_m2} (ref >=60)
GFR, Est Non African American: 89 mL/min/{1.73_m2} (ref >=60)
Globulin: 2.7 g/dL (calc) (ref 1.9–3.7)
Glucose, Bld: 84 mg/dL (ref 65–99)
Potassium: 4.7 mmol/L (ref 3.5–5.3)
Sodium: 138 mmol/L (ref 135–146)
Total Bilirubin: 0.5 mg/dL (ref 0.2–1.2)
Total Protein: 6.9 g/dL (ref 6.1–8.1)

## 2020-02-22 LAB — LIPID PANEL
Cholesterol: 173 mg/dL (ref <200)
HDL: 101 mg/dL (ref >=50)
LDL Cholesterol (Calc): 61 mg/dL (calc)
Non-HDL Cholesterol (Calc): 72 mg/dL (calc) (ref <130)
Total CHOL/HDL Ratio: 1.7 (calc) (ref <5.0)
Triglycerides: 39 mg/dL (ref <150)

## 2020-02-22 LAB — CBC WITH DIFFERENTIAL/PLATELET
Absolute Monocytes: 720 cells/uL (ref 200–950)
Basophils Absolute: 48 cells/uL (ref 0–200)
Basophils Relative: 0.6 %
Eosinophils Absolute: 312 cells/uL (ref 15–500)
Eosinophils Relative: 3.9 %
HCT: 39.3 % (ref 35.0–45.0)
Hemoglobin: 12.6 g/dL (ref 11.7–15.5)
Lymphs Abs: 2152 cells/uL (ref 850–3900)
MCH: 27.9 pg (ref 27.0–33.0)
MCHC: 32.1 g/dL (ref 32.0–36.0)
MCV: 86.9 fL (ref 80.0–100.0)
MPV: 10.2 fL (ref 7.5–12.5)
Monocytes Relative: 9 %
Neutro Abs: 4768 cells/uL (ref 1500–7800)
Neutrophils Relative %: 59.6 %
Platelets: 254 10*3/uL (ref 140–400)
RBC: 4.52 10*6/uL (ref 3.80–5.10)
RDW: 15.6 % — ABNORMAL HIGH (ref 11.0–15.0)
Total Lymphocyte: 26.9 %
WBC: 8 10*3/uL (ref 3.8–10.8)

## 2020-02-24 ENCOUNTER — Encounter: Payer: Self-pay | Admitting: Family Medicine

## 2020-03-08 ENCOUNTER — Other Ambulatory Visit: Payer: Self-pay | Admitting: Family Medicine

## 2020-03-08 DIAGNOSIS — I10 Essential (primary) hypertension: Secondary | ICD-10-CM

## 2020-03-08 NOTE — Telephone Encounter (Signed)
Change of pharmacy Requested Prescriptions  Pending Prescriptions Disp Refills  . metoprolol succinate (TOPROL-XL) 25 MG 24 hr tablet [Pharmacy Med Name: METOPROLOL SUCC ER 25 MG TAB] 90 tablet 0    Sig: TAKE 1 TABLET BY MOUTH EVERY DAY     Cardiovascular:  Beta Blockers Failed - 03/08/2020  8:09 AM      Failed - Last BP in normal range    BP Readings from Last 1 Encounters:  02/21/20 (!) 200/112         Passed - Last Heart Rate in normal range    Pulse Readings from Last 1 Encounters:  02/21/20 76         Passed - Valid encounter within last 6 months    Recent Outpatient Visits          2 weeks ago Recurrent major depressive disorder, in full remission [facility])   [facility] Steele Sizer, MD   5 months ago Atherosclerosis of aorta [facility])   [facility] Steele Sizer, MD   8 months ago Centrilobular emphysema [facility])   [facility] Steele Sizer, MD   12 months ago Centrilobular emphysema [facility])   [facility] Steele Sizer, MD   1 year ago Atherosclerosis of aorta [facility])   [facility] Steele Sizer, MD      Future Appointments            In 2 months Ralene Bathe, MD [place]   In 3 months Ancil Boozer, Drue Stager, MD [facility] [place]   In 4 months  [facility], [facility]

## 2020-03-17 ENCOUNTER — Telehealth: Payer: Self-pay | Admitting: Pharmacist

## 2020-03-17 NOTE — Progress Notes (Signed)
..   Reviewed chart for medication changes ahead of medication coordination call.    BP Readings from Last 3 Encounters:  02/21/20 (!) 200/112  09/17/19 120/74  06/14/19 (!) 137/111    No results found for: HGBA1C   Patient obtains medications through Adherence Packaging  30 Days   Last adherence delivery included: (medication name and frequency)  Patient declined (meds) last month due to PRN use/additional supply on hand. Explanation of abundance on hand (ie #30 due to overlapping fills or previous adherence issues etc)  Patient is due for next adherence delivery on: 03/27/2020. Called patient and reviewed medications and coordinated delivery.  This delivery to include:Celecoxib- take 1 capsule by mouth as needed (prn) Trelegy Aer 100 mcg Ellipta Losartan 50mg -take 1 tablet by mouth every day before breakfast Metoprolol 25 mg-take 1 tablet before breakfast Citalopram 20 mg-take one tablet in the evening Pantoprazole 40 mg- take one tablet before breakfast Atorvastatin 20 mg- take one tablet daily before breakfast Loratadine 10 mg-take one tablet daily before breakfast   Patient declined the following medications (fluticasone nasal spray) due to (prn use)  Patient needs refills for loratadine 10 mg .  Confirmed delivery date of 03/27/2020, advised patient that pharmacy will contact them the morning of delivery.

## 2020-04-22 ENCOUNTER — Telehealth: Payer: Self-pay

## 2020-04-22 NOTE — Progress Notes (Signed)
Chronic Care Management Pharmacy Assistant   Name: Terri Burns  MRN: [id number] DOB: June 15, 1950  Reason for Encounter: Medication Review  Patient Questions:  1.  Have you seen any other providers since your last visit? No  2.  Any changes in your medicines or health? No   Terri Burns,  69 y.o. , female presents for their Follow-Up CCM visit with the clinical pharmacist via telephone.  PCP : Steele Sizer, MD  Allergies:   Allergies  Allergen Reactions  . Levofloxacin Shortness Of Breath  . Penicillins Hives    itching  . Meloxicam     dizziness  . Nsaids Other (See Comments)    Patient states she can tolerate up to three doses per day without incident    Medications: Outpatient Encounter Medications as of 04/22/2020  Medication Sig Note  . albuterol (VENTOLIN HFA) 108 (90 Base) MCG/ACT inhaler Inhale 2 puffs into the lungs every 6 (six) hours as needed for wheezing or shortness of breath. 03/20/2019: Nebulizers   . aspirin 81 MG EC tablet TAKE 1 TABLET BY MOUTH EVERY DAY   . atorvastatin (LIPITOR) 20 MG tablet Take 1 tablet (20 mg total) by mouth daily.   . celecoxib (CELEBREX) 200 MG capsule Take 1 capsule (200 mg total) by mouth as needed.   . citalopram (CELEXA) 20 MG tablet Take 1 tablet (20 mg total) by mouth daily.   . fluticasone (FLONASE) 50 MCG/ACT nasal spray Place 2 sprays into both nostrils daily.   . Fluticasone-Umeclidin-Vilant (TRELEGY ELLIPTA) 100-62.5-25 MCG/INH AEPB Take 1 puff by mouth daily.   Marland Kitchen ipratropium-albuterol (DUONEB) 0.5-2.5 (3) MG/3ML SOLN Inhale 3 mLs into the lungs every 4 (four) hours as needed.   . loratadine (CLARITIN) 10 MG tablet Take 1 tablet (10 mg total) by mouth daily.   Marland Kitchen losartan (COZAAR) 50 MG tablet TAKE 1 TABLET BY MOUTH EVERY DAY   . metoprolol succinate (TOPROL-XL) 25 MG 24 hr tablet TAKE 1 TABLET BY MOUTH EVERY DAY   . pantoprazole (PROTONIX) 40 MG tablet Take 1 tablet (40 mg total) by mouth daily.    No  facility-administered encounter medications on file as of 04/22/2020.    Current Diagnosis: Patient Active Problem List   Diagnosis Date Noted  . Skin lesion of face 05/01/2018  . Atherosclerosis of aorta ([place]) 03/04/2018  . Coronary artery calcification of native artery 03/04/2018  . 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 Chronic neck pain 01/01/2018  . Benign essential HTN 01/01/2018  . Senile purpura ([place]) 01/01/2018  . Recurrent major depressive disorder, in full remission ([place]) 01/01/2018  . Hyperglycemia 09/27/2016  . Tobacco abuse 08/20/2015  . Allergic rhinitis, seasonal 05/12/2015  . Emphysema lung ([place]) 05/12/2015  . Degeneration of intervertebral disc of cervical region 05/12/2015  . GERD (gastroesophageal reflux disease) 05/12/2015  . Major depression in remission ([place]) 05/12/2015    Goals Addressed   None     Follow-Up:  Patient Assistance Coordination  .Marland Kitchen Reviewed chart for medication changes ahead of medication coordination call.  No OVs, Consults, or hospital visits since last care coordination call/Pharmacist visit. (If appropriate, list visit date, provider name)  No medication changes indicated OR if recent visit, treatment plan here.  BP Readings from Last 3 Encounters:  02/21/20 (!) 200/112  09/17/19 120/74  06/14/19 (!) 137/111    No results found for: HGBA1C   Patient obtains medications through Adherence Packaging  30 Days   Last adherence delivery included: :Celecoxib-  take 1 capsule by mouth as needed (prn) Trelegy Aer 100 mcg Ellipta Losartan 50mg -take 1 tablet by mouth every day before breakfast Metoprolol 25 mg-take 1 tablet before breakfast Citalopram 20 mg-take one tablet in the evening Pantoprazole 40 mg- take one tablet before breakfast Atorvastatin 20 mg- take one tablet daily before breakfast Loratadine 10 mg-take one tablet daily before breakfast  Patient declined :Celecoxib- take 1 capsule by mouth as needed  (prn) Trelegy Aer 100 mcg Ellipta Losartan 50mg -take 1 tablet by mouth every day before breakfast Metoprolol 25 mg-take 1 tablet before breakfast Citalopram 20 mg-take one tablet in the evening Pantoprazole 40 mg- take one tablet before breakfast Atorvastatin 20 mg- take one tablet daily before breakfast Loratadine 10 mg-take one tablet daily before breakfast  Patient is due for next adherence delivery on: 05/02/2020 Called patient and reviewed medications and coordinated delivery.  This delivery to include:Celecoxib- take 1 capsule by mouth as needed (prn) Trelegy Aer 100 mcg Ellipta Losartan 50mg -take 1 tablet by mouth every day before breakfast Metoprolol 25 mg-take 1 tablet before breakfast Citalopram 20 mg-take one tablet in the evening Pantoprazole 40 mg- take one tablet before breakfast Atorvastatin 20 mg- take one tablet daily before breakfast Loratadine 10 mg-take one tablet daily before breakfast  Patient declined the following medications fluticasone nasal spray (PRN) Albuterol 108 mcg/Act (PRN) Aspirin 81 mg EC (OTC)     Confirmed delivery date of 04/25/2020, advised patient that pharmacy will contact them the morning of delivery.

## 2020-05-12 ENCOUNTER — Ambulatory Visit (INDEPENDENT_AMBULATORY_CARE_PROVIDER_SITE_OTHER): Payer: Medicare Other | Admitting: Dermatology

## 2020-05-12 ENCOUNTER — Telehealth: Payer: Self-pay

## 2020-05-12 ENCOUNTER — Other Ambulatory Visit: Payer: Self-pay

## 2020-05-12 DIAGNOSIS — I2584 Coronary atherosclerosis due to calcified coronary lesion: Secondary | ICD-10-CM | POA: Diagnosis not present

## 2020-05-12 DIAGNOSIS — Z85828 Personal history of other malignant neoplasm of skin: Secondary | ICD-10-CM

## 2020-05-12 DIAGNOSIS — L821 Other seborrheic keratosis: Secondary | ICD-10-CM | POA: Diagnosis not present

## 2020-05-12 DIAGNOSIS — L578 Other skin changes due to chronic exposure to nonionizing radiation: Secondary | ICD-10-CM

## 2020-05-12 DIAGNOSIS — I2510 Atherosclerotic heart disease of native coronary artery without angina pectoris: Secondary | ICD-10-CM

## 2020-05-12 DIAGNOSIS — L570 Actinic keratosis: Secondary | ICD-10-CM | POA: Diagnosis not present

## 2020-05-12 DIAGNOSIS — C44329 Squamous cell carcinoma of skin of other parts of face: Secondary | ICD-10-CM

## 2020-05-12 DIAGNOSIS — D492 Neoplasm of unspecified behavior of bone, soft tissue, and skin: Secondary | ICD-10-CM

## 2020-05-12 HISTORY — DX: Personal history of other malignant neoplasm of skin: Z85.828

## 2020-05-12 NOTE — Chronic Care Management (AMB) (Deleted)
Chronic Care Management Pharmacy  Name: Terri Burns  MRN: [id number] DOB: 1951/04/25  Chief Complaint/ HPI  Terri Burns,  69 y.o. , female presents for their Follow-Up CCM visit with the clinical pharmacist via telephone.  PCP : Terri Sizer, MD  Their chronic conditions include: Hypertension, Hyperlipidemia, COPD, Depression, and Tobacco use  Office Visits: 02/21/20: Patient presented to Dr. Ancil Burns for follow-up. No medication changes made.  4/13 CAD, Terri Burns, spine fx, referred to pain clinic Terri Burns, HPV positive, Medicaid without Rx?, Trelegy $$$, nebs qid, lost daughter May 2020, COPD coughing daily, cutting cig  Consult Visit:  05/12/20: Patient presented to Dr. Nehemiah Burns (Dermatology) for neoplasm of skin.   Medications: Outpatient Encounter Medications as of 05/12/2020  Medication Sig Note  . albuterol (VENTOLIN HFA) 108 (90 Base) MCG/ACT inhaler Inhale 2 puffs into the lungs every 6 (six) hours as needed for wheezing or shortness of breath. 03/20/2019: Nebulizers   . aspirin 81 MG EC tablet TAKE 1 TABLET BY MOUTH EVERY DAY   . atorvastatin (LIPITOR) 20 MG tablet Take 1 tablet (20 mg total) by mouth daily.   . celecoxib (CELEBREX) 200 MG capsule Take 1 capsule (200 mg total) by mouth as needed.   . citalopram (CELEXA) 20 MG tablet Take 1 tablet (20 mg total) by mouth daily.   . fluticasone (FLONASE) 50 MCG/ACT nasal spray Place 2 sprays into both nostrils daily.   . Fluticasone-Umeclidin-Vilant (TRELEGY ELLIPTA) 100-62.5-25 MCG/INH AEPB Take 1 puff by mouth daily.   Marland Kitchen ipratropium-albuterol (DUONEB) 0.5-2.5 (3) MG/3ML SOLN Inhale 3 mLs into the lungs every 4 (four) hours as needed.   . loratadine (CLARITIN) 10 MG tablet Take 1 tablet (10 mg total) by mouth daily.   Marland Kitchen losartan (COZAAR) 50 MG tablet TAKE 1 TABLET BY MOUTH EVERY DAY   . metoprolol succinate (TOPROL-XL) 25 MG 24 hr tablet TAKE 1 TABLET BY MOUTH EVERY DAY   . pantoprazole (PROTONIX) 40 MG tablet Take 1  tablet (40 mg total) by mouth daily.    No facility-administered encounter medications on file as of 05/12/2020.     Financial Resource Strain: Low Risk   . Difficulty of Paying Living Expenses: Not hard at all   Current Diagnosis/Assessment:  Goals Addressed   None    COPD / Tobacco   Eosinophil count:   Lab Results  Component Value Date/Time   EOSPCT 3.9 02/21/2020 02:53 PM   EOSPCT 0.1 06/05/2013 06:49 AM  %                               Eos (Absolute):  Lab Results  Component Value Date/Time   EOSABS 312 02/21/2020 02:53 PM   EOSABS 0.0 06/05/2013 06:49 AM    Tobacco Status:  Social History   Tobacco Use  Smoking Status Current Every Day Smoker  . Packs/day: 0.60  . Years: 45.00  . Pack years: 27.00  . Types: Cigarettes  . Start date: 12/10/1973  Smokeless Tobacco Never Used  Tobacco Comment   previous pack a day smoker    Patient has failed these meds in past: NA Patient is currently uncontrolled on the following medications: DuoNeb Using maintenance inhaler regularly? No Frequency of rescue inhaler use:  1-2x per week  We discussed:   Duoneb q6h prn (qid minimum), once daily with Trelegy  Needs Flonase in fall Cigarettes per day? 10 Ready to quit Daily cough  Plan   Continue  current medications  Hypertension   Office blood pressures are  BP Readings from Last 3 Encounters:  02/21/20 (!) 200/112  09/17/19 120/74  06/14/19 (!) 137/111   Lab Results  Component Value Date   CREATININE 0.69 02/21/2020   BUN 14 02/21/2020   GFRNONAA 89 02/21/2020   GFRAA 104 02/21/2020   NA 138 02/21/2020   K 4.7 02/21/2020   CALCIUM 9.1 02/21/2020   CO2 25 02/21/2020   Patient checks BP at home infrequently Patient home BP readings are ranging: NA   Patient has failed these meds in past: NA Patient is currently controlled on the following medications:  . Losartan 50 mg daily  . Toprol XL 25 mg daily    We discussed     Plan  Continue  current medications   Hyperlipidemia   LDL goal < 100  Lipid Panel     Component Value Date/Time   CHOL 173 02/21/2020 1453   TRIG 39 02/21/2020 1453   HDL 101 02/21/2020 1453   LDLCALC 61 02/21/2020 1453    Hepatic Function Latest Ref Rng & Units 02/21/2020 03/13/2019 08/30/2018  Total Protein 6.1 - 8.1 g/dL 6.9 6.8 5.9(L)  Albumin 3.6 - 5.1 g/dL - - -  AST 10 - 35 U/L 22 28 33  ALT 6 - 29 U/L 19 24 30(H)  Alk Phosphatase 33 - 130 U/L - - -  Total Bilirubin 0.2 - 1.2 mg/dL 0.5 0.4 0.3     The ASCVD Risk score Terri Burns., et al., 2013) failed to calculate for the following reasons:   The valid HDL cholesterol range is 20 to 100 mg/dL   Patient has failed these meds in past: NA Patient is currently controlled on the following medications:  . Atorvastatin 53m daily . Aspirin 81 mg daily   We discussed:   At goal Denies myalgias  Plan  Continue current medications   Depression / Anxiety   PHQ9 Score:  PHQ9 SCORE ONLY 02/21/2020 09/17/2019 07/09/2019  PHQ-9 Total Score 1 0 0   GAD7 Score: GAD 7 : Generalized Anxiety Score 01/01/2018 11/22/2017  Nervous, Anxious, on Edge 0 0  Control/stop worrying 0 1  Worry too much - different things 0 0  Trouble relaxing 0 2  Restless 0 0  Easily annoyed or irritable 0 1  Afraid - awful might happen 0 0  Total GAD 7 Score 0 4  Anxiety Difficulty Not difficult at all -    Patient has failed these meds in past: NA Patient is currently controlled on the following medications:  . Citalopram 20 mg daily   We discussed:  ***  Plan  Continue {CHL [facility] Plans:[phone]}  GERD   Patient {Actions; denies-reports:120008::"denies"} {gerd assoc sx:31969:o:"dysphagia","heartburn","nausea"}. Expresses understanding to avoid triggers such as {causes; exacerbators GERD:13199}.  Currently {CHL Controlled/Uncontrolled:[phone]} on: . Pantoprazole 40 mg daily   Plan   {rxplan:23810::"Continue current  medication."}   Medication Management   Pt uses [facility] for all medications.   Reviewed patient's [facility] medication and Epic medication profile assuring there are no discrepancies or gaps in therapy. Confirmed all fill dates appropriate and verified with patient that there is a sufficient quantity of all prescribed medications at home. Informed patient to call me any time if needing medications before scheduled deliveries. The anticipated medication sync date is 05/26/20.  DEXA scan ordered, patient needs to get scheduled.  Follow up: 3 month phone visit  TMilus Height PharmD, BCGP, [facility] [phone]

## 2020-05-12 NOTE — Patient Instructions (Signed)

## 2020-05-12 NOTE — Progress Notes (Signed)
New Patient Visit  Subjective  Terri Burns is a 69 y.o. female who presents for the following: Skin Problem (Pt c/o growth on the R temple pt had this area treated with LN2 treatment several years ago now its has come back larger ).  The following portions of the chart were reviewed this encounter and updated as appropriate:   Tobacco  Allergies  Meds  Problems  Med Hx  Surg Hx  Fam Hx     Review of Systems:  No other skin or systemic complaints except as noted in HPI or Assessment and Plan.  Objective  Well appearing patient in no apparent distress; mood and affect are within normal limits.  A focused examination was performed including face, neck, chest and back and face. Relevant physical exam findings are noted in the Assessment and Plan.  Objective  R temple: 1.5 cm crusted papule      Objective  R temple: Erythematous thin papules/macules with gritty scale.    Assessment & Plan  Neoplasm of skin R temple  Epidermal / dermal shaving  Lesion diameter (cm):  1.5 Informed consent: discussed and consent obtained   Timeout: patient name, date of birth, surgical site, and procedure verified   Procedure prep:  Patient was prepped and draped in usual sterile fashion Prep type:  Isopropyl alcohol Anesthesia: the lesion was anesthetized in a standard fashion   Anesthetic:  1% lidocaine w/ epinephrine 1-100,000 buffered w/ 8.4% NaHCO3 Hemostasis achieved with: pressure, aluminum chloride and electrodesiccation   Outcome: patient tolerated procedure well   Post-procedure details: sterile dressing applied and wound care instructions given   Dressing type: bandage and petrolatum    Specimen 1 - Surgical pathology Differential Diagnosis: R/O ISK vs CA  Check Margins: No 1.5 cm crusted papule  AK (actinic keratosis) R temple  Pt decline treatment today, return to the office in 2 months for treatment   Actinic Damage - chronic, secondary to cumulative UV  radiation exposure/sun exposure over time - diffuse scaly erythematous macules with underlying dyspigmentation - Recommend daily broad spectrum sunscreen SPF 30+ to sun-exposed areas, reapply every 2 hours as needed.  - Call for new or changing lesions.  Severe, Confluent Chronic Actinic Changes with Pre-Cancerous Actinic Keratoses due to cumulative sun exposure/UV radiation exposure over time - Discussed "Field Treatment" Field treatment involves treatment of an entire area of skin that has confluent Actinic Changes (Sun/ Ultraviolet light damage) and PreCancerous Actinic Keratoses by method of PhotoDynamic Therapy (PDT) and/or prescription Topical Chemotherapy agents such as 5-fluorouracil, 5-fluorouracil/calcipotriene, and/or imiquimod.  The purpose is to decrease the number of clinically evident and subclinical PreCancerous lesions to prevent progression to development of skin cancer by chemically destroying early precancer changes that may or may not be visible.  It has been shown to reduce the risk of developing skin cancer in the treated area. As a result of treatment, redness, scaling, crusting, and open sores may occur during treatment course. One or more than one of these methods may be used and may have to be used several times to control, suppress and eliminate the PreCancerous changes. Discussed treatment course, expected reaction, and possible side effects.  .Seborrheic Keratoses - Stuck-on, waxy, tan-brown papules and plaques  - Discussed benign etiology and prognosis. - Observe - Call for any changes  Return in about 2 months (around 07/13/2020) for Aks .  Marta Lamas, CMA, am acting as scribe for Sarina Ser, MD .  Documentation: I have reviewed the  above documentation for accuracy and completeness, and I agree with the above.  Sarina Ser, MD

## 2020-05-14 ENCOUNTER — Telehealth: Payer: Self-pay

## 2020-05-14 NOTE — Telephone Encounter (Signed)
Patient informed of pathology results and verbalized understanding. Appointment scheduled for [facility].

## 2020-05-14 NOTE — Telephone Encounter (Signed)
-----   Message from Ralene Bathe, MD sent at 05/13/2020  7:40 PM EST ----- Diagnosis Skin , right temple [place] - [facility] Schedule for treatment (EDC)

## 2020-05-18 ENCOUNTER — Encounter: Payer: Self-pay | Admitting: Dermatology

## 2020-05-21 ENCOUNTER — Telehealth: Payer: Self-pay

## 2020-05-21 NOTE — Progress Notes (Signed)
Chronic Care Management Pharmacy Assistant   Name: Terri Burns  MRN: [id number] DOB: Mar 27, 1951  Reason for Encounter: Medication Review  PCP : Terri Sizer, MD  Allergies:   Allergies  Allergen Reactions  . Levofloxacin Shortness Of Breath  . Penicillins Hives    itching  . Meloxicam     dizziness  . Nsaids Other (See Comments)    Patient states she can tolerate up to three doses per day without incident    Medications: Outpatient Encounter Medications as of 05/21/2020  Medication Sig Note  . albuterol (VENTOLIN HFA) 108 (90 Base) MCG/ACT inhaler Inhale 2 puffs into the lungs every 6 (six) hours as needed for wheezing or shortness of breath. 03/20/2019: Nebulizers   . aspirin 81 MG EC tablet TAKE 1 TABLET BY MOUTH EVERY DAY   . atorvastatin (LIPITOR) 20 MG tablet Take 1 tablet (20 mg total) by mouth daily.   . celecoxib (CELEBREX) 200 MG capsule Take 1 capsule (200 mg total) by mouth as needed.   . citalopram (CELEXA) 20 MG tablet Take 1 tablet (20 mg total) by mouth daily.   . fluticasone (FLONASE) 50 MCG/ACT nasal spray Place 2 sprays into both nostrils daily.   . Fluticasone-Umeclidin-Vilant (TRELEGY ELLIPTA) 100-62.5-25 MCG/INH AEPB Take 1 puff by mouth daily.   Marland Kitchen ipratropium-albuterol (DUONEB) 0.5-2.5 (3) MG/3ML SOLN Inhale 3 mLs into the lungs every 4 (four) hours as needed.   . loratadine (CLARITIN) 10 MG tablet Take 1 tablet (10 mg total) by mouth daily.   Marland Kitchen losartan (COZAAR) 50 MG tablet TAKE 1 TABLET BY MOUTH EVERY DAY   . metoprolol succinate (TOPROL-XL) 25 MG 24 hr tablet TAKE 1 TABLET BY MOUTH EVERY DAY   . pantoprazole (PROTONIX) 40 MG tablet Take 1 tablet (40 mg total) by mouth daily.    No facility-administered encounter medications on file as of 05/21/2020.    Current Diagnosis: Patient Active Problem List   Diagnosis Date Noted  . Skin lesion of face 05/01/2018  . Atherosclerosis of aorta ([place]) 03/04/2018  . Coronary artery calcification of  native artery 03/04/2018  . 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 Chronic neck pain 01/01/2018  . Benign essential HTN 01/01/2018  . Senile purpura ([place]) 01/01/2018  . Recurrent major depressive disorder, in full remission ([place]) 01/01/2018  . Hyperglycemia 09/27/2016  . Tobacco abuse 08/20/2015  . Allergic rhinitis, seasonal 05/12/2015  . Emphysema lung ([place]) 05/12/2015  . Degeneration of intervertebral disc of cervical region 05/12/2015  . GERD (gastroesophageal reflux disease) 05/12/2015  . Major depression in remission ([place]) 05/12/2015    Goals Addressed   None     Follow-Up:  Pharmacist Review   Reviewed chart for medication changes ahead of medication coordination call.  OVs, Consults, or hospital visits since last care coordination call/Pharmacist visit.  05/12/2020 Dermatology Sarina Ser   No medication changes indicated.  BP Readings from Last 3 Encounters:  02/21/20 (!) 200/112  09/17/19 120/74  06/14/19 (!) 137/111    No results found for: HGBA1C   Patient obtains medications through Adherence Packaging  30 Days   Last adherence delivery included:  Celecoxib- take 1 capsule by mouth as needed (prn) Trelegy Aer 100 mcg Ellipta Losartan 50mg -take 1 tablet by mouth every day before breakfast Metoprolol 25 mg-take 1 tablet before breakfast Citalopram 20 mg-take one tablet in the evening Pantoprazole 40 mg- take one tablet before breakfast Atorvastatin 20 mg- take one tablet daily before breakfast Loratadine 10 mg-take one  tablet daily before breakfast  Patient declined (meds) last month due to PRN use/additional supply on hand. fluticasone nasal spray (PRN) Albuterol 108 mcg/Act (PRN) Aspirin 81 mg EC (OTC)  Patient is due for next adherence delivery on: 05/25/2020. Called patient and reviewed medications and coordinated delivery.  This delivery to include: Trelegy Aer 100 mcg Ellipta Losartan 50mg -take 1 tablet by mouth every day  before breakfast Metoprolol 25 mg-take 1 tablet before breakfast Citalopram 20 mg-take one tablet in the evening Pantoprazole 40 mg- take one tablet before breakfast Atorvastatin 20 mg- take one tablet daily before breakfast Loratadine 10 mg-take one tablet daily before breakfast  Patient declined the following medications (meds) due to (reason) fluticasone nasal spray (PRN) Albuterol 108 mcg/Act (PRN) Aspirin 81 mg EC (OTC) Celecoxib- take 1 capsule by mouth  (prn)  Patient needs refills for None ID .  Confirmed delivery date of 05/25/2020, advised patient that pharmacy will contact them the morning of delivery.  [place] Pharmacist Assistant [phone]

## 2020-06-08 NOTE — Progress Notes (Deleted)
Name: Terri Burns   MRN: [id number]    DOB: 1950/06/29   Date:06/08/2020       Progress Note  Subjective  Chief Complaint  Follow up   HPI Chronic neck pain:she has been on disability since 2007,  had x-ray andMRI done, she has DDD.She fell March 2020 and fracture her c-spine at C2 level, she went to [facility] just [facility], but she fell down again a few days later and had to be admitted because of severe hypotension , hypoxia and had COPD exacerbation. She continues to haveconstantneck pain, wearing a neck color, she is off ibuprofen, getting Celebrex now that she has Medicaid. She was referred to pain clinic but states only seen by  Dr. Cari Caraway at [facility], never got an appointment with pain clinic . She is off hydrocodone, she is willing to try lyrica again . She states pain right now is 6/10 , average pain is 4/10 described as aching and sometimes can be sharp, sometimes it burns, not radiating down arms.   Senile purpura: on aspirin, given reassurance   HPV positive: explained that she needs to return for pap smear-however because of neck problems she would like to hold off on that.She states not interested on that   HTN: she has been taking medications as prescribed, she states in more pain than usual today, also a little anxious because she needs to pack to go spend 2 weeks at son's house dog sitting . No  chest pain or palpitation   COPD : CT showed emphysema, shehad a flare March 2020. She denies SOB,she has daily coughing, she has been on Trelegy now and neg therapy as needed. She is doing better now   Major depression: shewas in remission, taking citalopram, however lost her daughter May 2020 and was grieving, she is feeling better now, recently lost sister in law and mother in law but coping well. Seems to be connect in her apartment, has a close friend that she talks to daily and still participate on church services remotely   Atherosclerosis aorta and coronary  vessels, seen on CT chest : aspirin and atorvastatin, we will recheck labs, denies side effects    *** Patient Active Problem List   Diagnosis Date Noted  . Skin lesion of face 05/01/2018  . Atherosclerosis of aorta ([place]) 03/04/2018  . Coronary artery calcification of native artery 03/04/2018  . 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 Chronic neck pain 01/01/2018  . Benign essential HTN 01/01/2018  . Senile purpura ([place]) 01/01/2018  . Recurrent major depressive disorder, in full remission ([place]) 01/01/2018  . Hyperglycemia 09/27/2016  . Tobacco abuse 08/20/2015  . Allergic rhinitis, seasonal 05/12/2015  . Emphysema lung ([place]) 05/12/2015  . Degeneration of intervertebral disc of cervical region 05/12/2015  . GERD (gastroesophageal reflux disease) 05/12/2015  . Major depression in remission ([place]) 05/12/2015    Past Surgical History:  Procedure Laterality Date  . NECK SURGERY N/A 10/2003  . SHOULDER SURGERY Left 2004    Family History  Problem Relation Age of Onset  . Hypertension Mother   . Stroke Mother   . Heart disease Father   . Diabetes Father   . Heart disease Daughter   . Pulmonary embolism Daughter   . Hypertension Son     Social History   Tobacco Use  . Smoking status: Current Every Day Smoker    Packs/day: 0.60    Years: 45.00    Pack years: 27.00    Types:  Cigarettes    Start date: 12/10/1973  . Smokeless tobacco: Never Used  . Tobacco comment: previous pack a day smoker  Substance Use Topics  . Alcohol use: Yes    Alcohol/week: 0.0 standard drinks    Comment: occasionally     Current Outpatient Medications:  .  albuterol (VENTOLIN HFA) 108 (90 Base) MCG/ACT inhaler, Inhale 2 puffs into the lungs every 6 (six) hours as needed for wheezing or shortness of breath., Disp: 18 Inhaler, Rfl: 2 .  aspirin 81 MG EC tablet, TAKE 1 TABLET BY MOUTH EVERY DAY, Disp: 90 tablet, Rfl: 0 .  atorvastatin (LIPITOR) 20 MG tablet, Take 1 tablet (20 mg  total) by mouth daily., Disp: 90 tablet, Rfl: 1 .  celecoxib (CELEBREX) 200 MG capsule, Take 1 capsule (200 mg total) by mouth as needed., Disp: 90 capsule, Rfl: 1 .  citalopram (CELEXA) 20 MG tablet, Take 1 tablet (20 mg total) by mouth daily., Disp: 90 tablet, Rfl: 1 .  fluticasone (FLONASE) 50 MCG/ACT nasal spray, Place 2 sprays into both nostrils daily., Disp: 48 mL, Rfl: 0 .  Fluticasone-Umeclidin-Vilant (TRELEGY ELLIPTA) 100-62.5-25 MCG/INH AEPB, Take 1 puff by mouth daily., Disp: 60 each, Rfl: 5 .  ipratropium-albuterol (DUONEB) 0.5-2.5 (3) MG/3ML SOLN, Inhale 3 mLs into the lungs every 4 (four) hours as needed., Disp: 120 mL, Rfl: 5 .  loratadine (CLARITIN) 10 MG tablet, Take 1 tablet (10 mg total) by mouth daily., Disp: 90 tablet, Rfl: 0 .  losartan (COZAAR) 50 MG tablet, TAKE 1 TABLET BY MOUTH EVERY DAY, Disp: 90 tablet, Rfl: 1 .  metoprolol succinate (TOPROL-XL) 25 MG 24 hr tablet, TAKE 1 TABLET BY MOUTH EVERY DAY, Disp: 90 tablet, Rfl: 0 .  pantoprazole (PROTONIX) 40 MG tablet, Take 1 tablet (40 mg total) by mouth daily., Disp: 90 tablet, Rfl: 1  Allergies  Allergen Reactions  . Levofloxacin Shortness Of Breath  . Penicillins Hives    itching  . Meloxicam     dizziness  . Nsaids Other (See Comments)    Patient states she can tolerate up to three doses per day without incident    I personally reviewed {Reviewed:14835} with the patient/caregiver today.   ROS  ***  Objective  There were no vitals filed for this visit.  There is no height or weight on file to calculate BMI.  Physical Exam ***  No results found for this or any previous visit (from the past 2160 hour(s)).  Diabetic Foot Exam: Diabetic Foot Exam - Simple   No data filed    ***  PHQ2/9: Depression screen [facility] 2/9 02/21/2020 09/17/2019 07/09/2019 07/09/2019 06/14/2019  Decreased Interest 0 0 0 0 0  Down, Depressed, Hopeless 0 0 0 0 0  PHQ - 2 Score 0 0 0 0 0  Altered sleeping 0 0 - - 0  Tired, decreased  energy 1 0 - - 0  Change in appetite 0 0 - - 0  Feeling bad or failure about yourself  0 0 - - 0  Trouble concentrating 0 0 - - 0  Moving slowly or fidgety/restless 0 0 - - 0  Suicidal thoughts 0 0 - - 0  PHQ-9 Score 1 0 - - 0  Difficult doing work/chores Not difficult at all Not difficult at all - - Not difficult at all  Some recent data might be hidden    phq 9 is {gen pos NO:[id number] ***  Fall Risk: Fall Risk  02/21/2020 09/17/2019 07/09/2019 06/14/2019 03/13/2019  Falls  in the past year? 0 0 1 1 1   Number falls in past yr: 0 0 1 1 1   Injury with Fall? 0 0 1 1 1   Comment - - - - Broke a vertebrae in her neck C2  Risk for fall due to : - - History of fall(s);Impaired vision;Orthopedic patient - History of fall(s);Impaired balance/gait  Risk for fall due to: Comment - - - - -  Follow up - Falls evaluation completed Falls prevention discussed - -   ***   Functional Status Survey:   ***   Assessment & Plan  *** There are no diagnoses linked to this encounter.

## 2020-06-09 ENCOUNTER — Ambulatory Visit: Payer: Medicare Other | Admitting: Family Medicine

## 2020-06-11 ENCOUNTER — Ambulatory Visit (INDEPENDENT_AMBULATORY_CARE_PROVIDER_SITE_OTHER): Payer: Medicare Other | Admitting: Dermatology

## 2020-06-11 ENCOUNTER — Encounter: Payer: Self-pay | Admitting: Dermatology

## 2020-06-11 ENCOUNTER — Other Ambulatory Visit: Payer: Self-pay

## 2020-06-11 DIAGNOSIS — C4492 Squamous cell carcinoma of skin, unspecified: Secondary | ICD-10-CM

## 2020-06-11 DIAGNOSIS — C44329 Squamous cell carcinoma of skin of other parts of face: Secondary | ICD-10-CM | POA: Diagnosis not present

## 2020-06-11 DIAGNOSIS — L578 Other skin changes due to chronic exposure to nonionizing radiation: Secondary | ICD-10-CM

## 2020-06-11 NOTE — Patient Instructions (Signed)

## 2020-06-11 NOTE — Progress Notes (Signed)
   Follow-Up Visit   Subjective  Terri Burns is a 70 y.o. female who presents for the following: Procedure (Biopsy proven SCC of right temple - EDC today).  The following portions of the chart were reviewed this encounter and updated as appropriate:   Tobacco  Allergies  Meds  Problems  Med Hx  Surg Hx  Fam Hx     Review of Systems:  No other skin or systemic complaints except as noted in HPI or Assessment and Plan.  Objective  Well appearing patient in no apparent distress; mood and affect are within normal limits.  A focused examination was performed including right temple. Relevant physical exam findings are noted in the Assessment and Plan.  Objective  Right Temple: Healing biopsy site   Assessment & Plan    Actinic Damage - chronic, secondary to cumulative UV radiation exposure/sun exposure over time - diffuse scaly erythematous macules with underlying dyspigmentation - Recommend daily broad spectrum sunscreen SPF 30+ to sun-exposed areas, reapply every 2 hours as needed.  - Call for new or changing lesions.  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  Destruction of lesion Complexity: extensive   Destruction method: electrodesiccation and curettage   Informed consent: discussed and consent obtained   Timeout:  patient name, date of birth, surgical site, and procedure verified Procedure prep:  Patient was prepped and draped in usual sterile fashion Prep type:  Isopropyl alcohol Anesthesia: the lesion was anesthetized in a standard fashion   Anesthetic:  1% lidocaine w/ epinephrine 1-100,000 buffered w/ 8.4% NaHCO3 Curettage performed in three different directions: Yes   Electrodesiccation performed over the curetted area: Yes   Lesion length (cm):  1.5 Lesion width (cm):  1.5 Margin per side (cm):  0.3 Final wound size (cm):  2.1 Hemostasis achieved with:  pressure and aluminum chloride Outcome: patient tolerated procedure well with no complications    Post-procedure details: sterile dressing applied and wound care instructions given   Dressing type: bandage and petrolatum    Return for Follow up as scheduled.   I, Joanie Coddington, CMA, am acting as scribe for Armida Sans, MD .  Documentation: I have reviewed the above documentation for accuracy and completeness, and I agree with the above.  Armida Sans, MD

## 2020-06-15 ENCOUNTER — Telehealth: Payer: Self-pay

## 2020-06-15 NOTE — Progress Notes (Addendum)
Chronic Care Management Pharmacy Assistant   Name: Terri Burns  MRN: [id number] DOB: 09/27/1950  Reason for Encounter: Medication Review  PCP : Steele Sizer, MD  Allergies:   Allergies  Allergen Reactions   Levofloxacin Shortness Of Breath   Penicillins Hives    itching   Meloxicam     dizziness   Nsaids Other (See Comments)    Patient states she can tolerate up to three doses per day without incident    Medications: Outpatient Encounter Medications as of 06/15/2020  Medication Sig Note   albuterol (VENTOLIN HFA) 108 (90 Base) MCG/ACT inhaler Inhale 2 puffs into the lungs every 6 (six) hours as needed for wheezing or shortness of breath. 03/20/2019: Nebulizers    aspirin 81 MG EC tablet TAKE 1 TABLET BY MOUTH EVERY DAY    atorvastatin (LIPITOR) 20 MG tablet Take 1 tablet (20 mg total) by mouth daily.    celecoxib (CELEBREX) 200 MG capsule Take 1 capsule (200 mg total) by mouth as needed.    citalopram (CELEXA) 20 MG tablet Take 1 tablet (20 mg total) by mouth daily.    fluticasone (FLONASE) 50 MCG/ACT nasal spray Place 2 sprays into both nostrils daily.    Fluticasone-Umeclidin-Vilant (TRELEGY ELLIPTA) 100-62.5-25 MCG/INH AEPB Take 1 puff by mouth daily.    ipratropium-albuterol (DUONEB) 0.5-2.5 (3) MG/3ML SOLN Inhale 3 mLs into the lungs every 4 (four) hours as needed.    loratadine (CLARITIN) 10 MG tablet Take 1 tablet (10 mg total) by mouth daily.    losartan (COZAAR) 50 MG tablet TAKE 1 TABLET BY MOUTH EVERY DAY    metoprolol succinate (TOPROL-XL) 25 MG 24 hr tablet TAKE 1 TABLET BY MOUTH EVERY DAY    pantoprazole (PROTONIX) 40 MG tablet Take 1 tablet (40 mg total) by mouth daily.    No facility-administered encounter medications on file as of 06/15/2020.    Current Diagnosis: Patient Active Problem List   Diagnosis Date Noted   Skin lesion of face 05/01/2018   Atherosclerosis of aorta ([place]) 03/04/2018   Coronary artery calcification of native artery  03/04/2018   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   Chronic neck pain 01/01/2018   Benign essential HTN 01/01/2018   Senile purpura ([place]) 01/01/2018   Recurrent major depressive disorder, in full remission ([place]) 01/01/2018   Hyperglycemia 09/27/2016   Tobacco abuse 08/20/2015   Allergic rhinitis, seasonal 05/12/2015   Emphysema lung ([place]) 05/12/2015   Degeneration of intervertebral disc of cervical region 05/12/2015   GERD (gastroesophageal reflux disease) 05/12/2015   Major depression in remission ([place]) 05/12/2015    Goals Addressed   None     Reviewed chart for medication changes ahead of medication coordination call.  No OVs, Consults, or hospital visits since last care coordination call/Pharmacist visit.   06/11/2020 Dermatology Sarina Ser  No medication changes indicated.  BP Readings from Last 3 Encounters:  02/21/20 (!) 200/112  09/17/19 120/74  06/14/19 (!) 137/111    No results found for: HGBA1C   Patient obtains medications through Adherence Packaging  30 Days   Last adherence delivery included:  Trelegy Aer 100 mcg Ellipta Losartan 50mg -take 1 tablet by mouth every day before breakfast Metoprolol 25 mg-take 1 tablet before breakfast Citalopram 20 mg-take one tablet in the evening Pantoprazole 40 mg- take one tablet before breakfast Atorvastatin 20 mg- take one tablet daily before breakfast Loratadine 10 mg-take one tablet daily before breakfast  Patient declined medication last month: fluticasone nasal spray (PRN)  Albuterol 108 mcg/Act (PRN) Aspirin 81 mg EC (OTC) Celecoxib- take 1 capsule by mouth  (prn)  Patient is due for next adherence delivery on: 06/22/2020. Called patient and reviewed medications and coordinated delivery. Left a voice message 06/15/2020,06/17/2020 ,06/19/2020,06/22/2020 ,01/18/2022BK   This delivery to include: Trelegy Aer 100 mcg Ellipta Losartan 50mg -take 1 tablet by mouth every day before  breakfast Metoprolol 25 mg-take 1 tablet before breakfast Citalopram 20 mg-take one tablet in the evening Pantoprazole 40 mg- take one tablet before breakfast Atorvastatin 20 mg- take one tablet daily before breakfast Loratadine 10 mg-take one tablet daily before breakfast  Patient will not need a short fill of medication , prior to adherence delivery. Patient will not need a acute fill of medication, prior to adherence delivery.  Patient declined the following medications: fluticasone nasal spray (PRN) Albuterol 108 mcg/Act (PRN) Aspirin 81 mg EC (OTC) Celecoxib- take 1 capsule by mouth  (prn)  Patient needs refills for None ID .  Confirmed delivery date of 06/25/2020, advised patient that pharmacy will contact them the morning of delivery.  Blood pressure readings: Patient states her monitor is not working correctly. Patient states she will purchase another one soon.  Follow-Up:  Pharmacist Review   [facility] [place] Clinical Pharmacist Assistant [phone]   14 minutes spent in review, coordination, and documentation. [facility] [phone]

## 2020-06-23 ENCOUNTER — Encounter: Payer: Self-pay | Admitting: Dermatology

## 2020-07-09 ENCOUNTER — Ambulatory Visit (INDEPENDENT_AMBULATORY_CARE_PROVIDER_SITE_OTHER): Payer: Medicare Other

## 2020-07-09 DIAGNOSIS — Z0000 Encounter for general adult medical examination without abnormal findings: Secondary | ICD-10-CM | POA: Diagnosis not present

## 2020-07-09 DIAGNOSIS — Z0100 Encounter for examination of eyes and vision without abnormal findings: Secondary | ICD-10-CM | POA: Diagnosis not present

## 2020-07-09 NOTE — Progress Notes (Signed)
Subjective:   Terri Burns is a 70 y.o. female who presents for Medicare Annual (Subsequent) preventive examination.  Virtual Visit via Telephone Note  I connected with  Terri Burns on 07/09/20 at  1:30 PM EST by telephone and verified that I am speaking with the correct person using two identifiers.  Location: Patient: home Provider: [place] Persons participating in the virtual visit: [place]   I discussed the limitations, risks, security and privacy concerns of performing an evaluation and management service by telephone and the availability of in person appointments. The patient expressed understanding and agreed to proceed.  Interactive audio and video telecommunications were attempted between this nurse and patient, however failed, due to patient having technical difficulties OR patient did not have access to video capability.  We continued and completed visit with audio only.  Some vital signs may be absent or patient reported.   Clemetine Marker, LPN    Review of Systems     Cardiac Risk Factors include: advanced age (>34men, >41 women);dyslipidemia;hypertension;smoking/ tobacco exposure     Objective:    Today's Vitals   07/09/20 1318  PainSc: 6    There is no height or weight on file to calculate BMI.  Advanced Directives 07/09/2020 07/09/2019 08/20/2018 08/16/2018 07/15/2018 03/29/2017 09/27/2016  Does Patient Have a Medical Advance Directive? No No No No No No No  Would patient like information on creating a medical advance directive? Yes (MAU/Ambulatory/Procedural Areas - Information given) No - Patient declined No - Patient declined - - No - Patient declined -    Current Medications (verified) Outpatient Encounter Medications as of 07/09/2020  Medication Sig  . albuterol (VENTOLIN HFA) 108 (90 Base) MCG/ACT inhaler Inhale 2 puffs into the lungs every 6 (six) hours as needed for wheezing or shortness of breath.  Marland Kitchen aspirin 81 MG EC tablet TAKE 1  TABLET BY MOUTH EVERY DAY  . atorvastatin (LIPITOR) 20 MG tablet Take 1 tablet (20 mg total) by mouth daily.  . celecoxib (CELEBREX) 200 MG capsule Take 1 capsule (200 mg total) by mouth as needed.  . citalopram (CELEXA) 20 MG tablet Take 1 tablet (20 mg total) by mouth daily.  . fluticasone (FLONASE) 50 MCG/ACT nasal spray Place 2 sprays into both nostrils daily.  . Fluticasone-Umeclidin-Vilant (TRELEGY ELLIPTA) 100-62.5-25 MCG/INH AEPB Take 1 puff by mouth daily.  Marland Kitchen ipratropium-albuterol (DUONEB) 0.5-2.5 (3) MG/3ML SOLN Inhale 3 mLs into the lungs every 4 (four) hours as needed.  . loratadine (CLARITIN) 10 MG tablet Take 1 tablet (10 mg total) by mouth daily.  Marland Kitchen losartan (COZAAR) 50 MG tablet TAKE 1 TABLET BY MOUTH EVERY DAY  . metoprolol succinate (TOPROL-XL) 25 MG 24 hr tablet TAKE 1 TABLET BY MOUTH EVERY DAY  . pantoprazole (PROTONIX) 40 MG tablet Take 1 tablet (40 mg total) by mouth daily.   No facility-administered encounter medications on file as of 07/09/2020.    Allergies (verified) Levofloxacin, Penicillins, Meloxicam, and Nsaids   History: Past Medical History:  Diagnosis Date  . COPD (chronic obstructive pulmonary disease) ([place])   . Depression   . GERD (gastroesophageal reflux disease)   . 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 Hyperlipidemia   . Hypertension   . 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 NECK SURGERY N/A 10/2003  . SHOULDER SURGERY Left 2004  Family History  Problem Relation Age of Onset  . Hypertension Mother   . Stroke Mother   . Heart disease Father   . Diabetes Father   . Heart disease Daughter   . Pulmonary embolism Daughter   . Hypertension Son    Social History   Socioeconomic History  . Marital status: Widowed    Spouse name: Not on file  . Number of children: 3  . Years of education: Not on file   . Highest education level: Some college, no degree  Occupational History  . 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 Smoking status: Current Every Day Smoker    Packs/day: 0.60    Years: 45.00    Pack years: 27.00    Types: Cigarettes    Start date: 12/10/1973  . Smokeless tobacco: Never Used  . Tobacco comment: previous pack a day smoker  Vaping Use  . Vaping Use: Never used  Substance and Sexual Activity  . Alcohol use: Yes    Alcohol/week: 0.0 standard drinks    Comment: occasionally  . Drug use: No  . Sexual activity: Not Currently    Partners: Male  Other Topics Concern  . Not on file  Social History Narrative   Lives in a senior citizen complex and goes out with her friends.   Her daughter died due to heart issues on 2018/11/07 at the age of 30.   Both sons live in town and are helping her out    Social Determinants of Health   Financial Resource Strain: Low Risk   . Difficulty of Paying Living Expenses: Not hard at all  Food Insecurity: No Food Insecurity  . Worried About Charity fundraiser in the Last Year: Never true  . Ran Out of Food in the Last Year: Never true  Transportation Needs: No Transportation Needs  . Lack of Transportation (Medical): No  . Lack of Transportation (Non-Medical): No  Physical Activity: Inactive  . Days of Exercise per Week: 0 days  . Minutes of Exercise per Session: 0 min  Stress: No Stress Concern Present  . Feeling of Stress : Not at all  Social Connections: Moderately Integrated  . Frequency of Communication with Friends and Family: More than three times a week  . Frequency of Social Gatherings with Friends and Family: More than three times a week  . Attends Religious Services: More than 4 times per year  . Active Member of Clubs or Organizations: Yes  . Attends Archivist Meetings: More than 4 times per year  . Marital Status: Widowed    Tobacco Counseling Ready to quit: Not  Answered Counseling given: Not Answered Comment: previous pack a day smoker   Clinical Intake:  Pre-visit preparation completed: Yes  Pain : 0-10 Pain Score: 6  Pain Type: Chronic pain Pain Location: Neck Pain Descriptors / Indicators: Aching,Sore Pain Onset: More than a month ago Pain Frequency: Constant     Nutritional Risks: None Diabetes: No  How often do you need to have someone help you when you read instructions, pamphlets, or other written materials from your doctor or pharmacy?: 1 - Never    Interpreter Needed?: No  Information entered by :: Clemetine Marker LPN   Activities of Daily Living In your present state of health, do you have any difficulty performing the following activities: 07/09/2020 02/21/2020  Hearing? N N  Comment declines hearing aids -  Vision?  N N  Difficulty concentrating or making decisions? N N  Walking or climbing stairs? Y Y  Dressing or bathing? N N  Doing errands, shopping? N Y  Conservation officer, nature and eating ? N -  Using the Toilet? N -  In the past six months, have you accidently leaked urine? Y -  Comment wears pads for protection -  Do you have problems with loss of bowel control? N -  Managing your Medications? N -  Managing your Finances? N -  Housekeeping or managing your Housekeeping? N -  Some recent data might be hidden    Patient Care Team: Steele Sizer, MD as PCP - General (Family Medicine) Nadene Rubins, DO as Referring Physician (Physical Medicine and Rehabilitation)  Indicate any recent Medical Services you may have received from other than [facility] providers in the past year (date may be approximate).     Assessment:   This is a routine wellness examination for Terri Burns.  Hearing/Vision screen  Hearing Screening   125Hz  250Hz  500Hz  1000Hz  2000Hz  3000Hz  4000Hz  6000Hz  8000Hz   Right ear:           Left ear:           Comments: Pt denies hearing difficulty  Vision Screening Comments: Past due for eye exam; referral  to opthalmology sent today.   Dietary issues and exercise activities discussed: Current Exercise Habits: The patient does not participate in regular exercise at present, Exercise limited by: orthopedic condition(s)  Goals    .  Chronic Care Management      CARE PLAN ENTRY (see longitudinal plan of care for additional care plan information)  Current Barriers:  . Chronic Disease Management support, education, and care coordination needs related to Hypertension, Hyperlipidemia, COPD, Depression, and Tobacco use   Hypertension BP Readings from Last 3 Encounters:  09/17/19 120/74  06/14/19 (!) 137/111  03/13/19 (!) 152/78   . Pharmacist Clinical Goal(s): o Over the next 90 days, patient will work with PharmD and providers to achieve BP goal <140/90 . Current regimen:  o Losartan 50mg  daily o Toprol-XL 25mg  daily  . Interventions: o Reducing cigarettes and smoking cessation to reduce blood pressure . Patient self care activities - Over the next 90 days, patient will: o Check BP daily, document, and provide at future appointments o Decrease daily cigarettes o Ensure daily salt intake < 2300 mg/day  Hyperlipidemia Lab Results  Component Value Date/Time   LDLCALC 47 03/13/2019 12:00 AM   . Pharmacist Clinical Goal(s): o Over the next 90 days, patient will work with PharmD and providers to maintain LDL goal < 100 . Current regimen:  o Lipitor 20mg  daily . Interventions: o None . Patient self care activities - Over the next 90 days, patient will: o Increase consumption of lean meats, fruits and vegetables  COPD . Pharmacist Clinical Goal(s) o Over the next 90 days, patient will work with PharmD and providers to improve quality of maintenance inhalers using full LIS insurance . Current regimen:  o DuoNeb four times daily minimum  . Interventions: o Try new insurance on Trelegy and other beneficial name brand mes o Call the [place] Quitline for smoking cessation  . Patient self  care activities - Over the next 90 days, patient will: o Reduce daily cigarettes o Call [phone]  Medication management . Pharmacist Clinical Goal(s): o Over the next 90 days, patient will work with PharmD and providers to achieve optimal medication adherence . Current pharmacy: [facility] . Interventions o Comprehensive  medication review performed. o Utilize [facility] pharmacy for medication synchronization, packaging and delivery . Patient self care activities - Over the next 90 days, patient will: o Focus on medication adherence by working with PharmD to transition pharmacies  o Take medications as prescribed o Report any questions or concerns to PharmD and/or provider(s)  Initial goal documentation     .  Medication cost- Trelegy (pt-stated)      Current Barriers:  . financial  Pharmacist Clinical Goal(s): Over the next 14 days, Terri Burns will provide the necessary supplementary documents (proof of out of pocket prescription expenditure, proof of household income) needed for medication assistance applications to CCM pharmacist.   Interventions: . CCM pharmacist will apply for medication assistance program for Trelegy Ellipta made by [employer] and prescribed by Dr. Steele Sizer  Patient Self Care Activities:  Marland Kitchen Gather necessary documents needed to apply for medication assistance  Initial goal documentation     .  Quit smoking / using tobacco      Recommend to attend smoking cessation classes with [facility] to discuss smoking cessation      Depression Screen PHQ 2/9 Scores 07/09/2020 02/21/2020 09/17/2019 07/09/2019 07/09/2019 06/14/2019 03/13/2019  PHQ - 2 Score 0 0 0 0 0 0 1  PHQ- 9 Score - 1 0 - - 0 3    Fall Risk Fall Risk  07/09/2020 02/21/2020 09/17/2019 07/09/2019 06/14/2019  Falls in the past year? 0 0 0 1 1  Number falls in past yr: 0 0 0 1 1  Injury with Fall? 0 0 0 1 1  Comment - - - - -  Risk for fall due to : Impaired balance/gait - - History of fall(s);Impaired  vision;Orthopedic patient -  Risk for fall due to: Comment - - - - -  Follow up Falls prevention discussed - Falls evaluation completed Falls prevention discussed -    FALL RISK PREVENTION PERTAINING TO THE HOME:  Any stairs in or around the home? No  If so, are there any without handrails? No  Home free of loose throw rugs in walkways, pet beds, electrical cords, etc? Yes  Adequate lighting in your home to reduce risk of falls? Yes   ASSISTIVE DEVICES UTILIZED TO PREVENT FALLS:  Life alert? No  Use of a cane, walker or w/c? Yes  Grab bars in the bathroom? Yes  Shower chair or bench in shower? Yes  Elevated toilet seat or a handicapped toilet? No   TIMED UP AND GO:  Was the test performed? No . Telephonic visit   Cognitive Function: Normal cognitive status assessed by direct observation by this Nurse Health Advisor. No abnormalities found.       6CIT Screen 07/09/2019  What Year? 0 points  What month? 0 points  What time? 0 points  Count back from 20 0 points  Months in reverse 0 points  Repeat phrase 0 points  Total Score 0    Immunizations Immunization History  Administered Date(s) Administered  . Fluad Quad(high Dose 65+) 03/13/2019, 02/21/2020  . Influenza, High Dose Seasonal PF 03/29/2017  . Influenza, Seasonal, Injecte, Preservative Fre 03/26/2012  . Influenza,inj,Quad PF,6+ Mos 03/18/2013, 05/12/2015, 05/09/2016  . Pneumococcal Conjugate-13 08/30/2018  . Pneumococcal Polysaccharide-23 03/08/2010, 06/09/2013, 02/21/2020  . Tdap 05/27/2013    TDAP status: Up to date  Flu Vaccine status: Up to date  Pneumococcal vaccine status: Up to date  Covid-19 vaccine status: Completed vaccines; pt advised to bring vaccination record to next appt  Qualifies for  Shingles Vaccine? Yes   Zostavax completed No   Shingrix Completed?: No.    Education has been provided regarding the importance of this vaccine. Patient has been advised to call insurance company to determine  out of pocket expense if they have not yet received this vaccine. Advised may also receive vaccine at local pharmacy or Health Dept. Verbalized acceptance and understanding.  Screening Tests Health Maintenance  Topic Date Due  . COVID-19 Vaccine (1) Never done  . COLONOSCOPY (Pts 45-23yrs Insurance coverage will need to be confirmed)  Never done  . MAMMOGRAM  Never done  . DEXA SCAN  Never done  . TETANUS/TDAP  05/28/2023  . INFLUENZA VACCINE  Completed  . Hepatitis C Screening  Completed  . PNA vac Low Risk Adult  Completed    Health Maintenance  Health Maintenance Due  Topic Date Due  . COVID-19 Vaccine (1) Never done  . COLONOSCOPY (Pts 45-4yrs Insurance coverage will need to be confirmed)  Never done  . MAMMOGRAM  Never done  . DEXA SCAN  Never done    Colorectal cancer screening: pt declines screening colonoscopy.   Mammogram status: Ordered 02/06/20. Pt provided with contact info and advised to call to schedule appt.   Bone Density status: Ordered 02/21/20. Pt provided with contact info and advised to call to schedule appt.  Lung Cancer Screening: (Low Dose CT Chest recommended if Age 15-80 years, 30 pack-year currently smoking OR have quit w/in 15years.) does qualify. Completed 09/26/19  Additional Screening:  Hepatitis C Screening: does qualify; Completed 11/22/17  Vision Screening: Recommended annual ophthalmology exams for early detection of glaucoma and other disorders of the eye. Is the patient up to date with their annual eye exam?  No  Who is the provider or what is the name of the office in which the patient attends annual eye exams? Not established If pt is not established with a provider, would they like to be referred to a provider to establish care? Yes .   Dental Screening: Recommended annual dental exams for proper oral hygiene  Community Resource Referral / Chronic Care Management: CRR required this visit?  No   CCM required this visit?  No       Plan:     I have personally reviewed and noted the following in the patient's chart:   . Medical and social history . Use of alcohol, tobacco or illicit drugs  . Current medications and supplements . Functional ability and status . Nutritional status . Physical activity . Advanced directives . List of other physicians . Hospitalizations, surgeries, and ER visits in previous 12 months . Vitals . Screenings to include cognitive, depression, and falls . Referrals and appointments  In addition, I have reviewed and discussed with patient certain preventive protocols, quality metrics, and best practice recommendations. A written personalized care plan for preventive services as well as general preventive health recommendations were provided to patient.     Clemetine Marker, LPN   12/09/2829   Nurse Notes: none

## 2020-07-09 NOTE — Patient Instructions (Signed)
[name] , Thank you for taking time to come for your Medicare Wellness Visit. I appreciate your ongoing commitment to your health goals. Please review the following plan we discussed and let me know if I can assist you in the future.   Screening recommendations/referrals: Colonoscopy: declined Mammogram: Please call [phone] to schedule your mammogram and bone density screening.   Recommended yearly ophthalmology/optometry visit for glaucoma screening and checkup Recommended yearly dental visit for hygiene and checkup  Vaccinations: Influenza vaccine: done 02/21/20 Pneumococcal vaccine: done 02/21/20 Tdap vaccine: done 05/27/13 Shingles vaccine: Shingrix discussed. Please contact your pharmacy for coverage information.  Covid-19: please bring a copy of your vaccine record to your next appointment  Advanced directives: Advance directive discussed with you today. I have provided a copy for you to complete at home and have notarized. Once this is complete please bring a copy in to our office so we can scan it into your chart.  Conditions/risks identified: If you wish to quit smoking, help is available. For free tobacco cessation program offerings call the [facility] at 703-084-6866 or Live Well Line at (770)702-8651. You may also visit [email or web address] or email [email or web address] for more information on other programs.   Next appointment: Follow up in one year for your annual wellness visit    Preventive Care 65 Years and Older, Female Preventive care refers to lifestyle choices and visits with your health care provider that can promote health and wellness. What does preventive care include?  A yearly physical exam. This is also called an annual well check.  Dental exams once or twice a year.  Routine eye exams. Ask your health care provider how often you should have your eyes checked.  Personal lifestyle choices, including:  Daily care of your teeth  and gums.  Regular physical activity.  Eating a healthy diet.  Avoiding tobacco and drug use.  Limiting alcohol use.  Practicing safe sex.  Taking low-dose aspirin every day.  Taking vitamin and mineral supplements as recommended by your health care provider. What happens during an annual well check? The services and screenings done by your health care provider during your annual well check will depend on your age, overall health, lifestyle risk factors, and family history of disease. Counseling  Your health care provider may ask you questions about your:  Alcohol use.  Tobacco use.  Drug use.  Emotional well-being.  Home and relationship well-being.  Sexual activity.  Eating habits.  History of falls.  Memory and ability to understand (cognition).  Work and work Statistician.  Reproductive health. Screening  You may have the following tests or measurements:  Height, weight, and BMI.  Blood pressure.  Lipid and cholesterol levels. These may be checked every 5 years, or more frequently if you are over 39 years old.  Skin check.  Lung cancer screening. You may have this screening every year starting at age 38 if you have a 30-pack-year history of smoking and currently smoke or have quit within the past 15 years.  Fecal occult blood test (FOBT) of the stool. You may have this test every year starting at age 2.  Flexible sigmoidoscopy or colonoscopy. You may have a sigmoidoscopy every 5 years or a colonoscopy every 10 years starting at age 51.  Hepatitis C blood test.  Hepatitis B blood test.  Sexually transmitted disease (STD) testing.  Diabetes screening. This is done by checking your blood sugar (glucose) after you have not eaten for a while (fasting).  You may have this done every 1-3 years.  Bone density scan. This is done to screen for osteoporosis. You may have this done starting at age 85.  Mammogram. This may be done every 1-2 years. Talk to  your health care provider about how often you should have regular mammograms. Talk with your health care provider about your test results, treatment options, and if necessary, the need for more tests. Vaccines  Your health care provider may recommend certain vaccines, such as:  Influenza vaccine. This is recommended every year.  Tetanus, diphtheria, and acellular pertussis (Tdap, Td) vaccine. You may need a Td booster every 10 years.  Zoster vaccine. You may need this after age 2.  Pneumococcal 13-valent conjugate (PCV13) vaccine. One dose is recommended after age 63.  Pneumococcal polysaccharide (PPSV23) vaccine. One dose is recommended after age 46. Talk to your health care provider about which screenings and vaccines you need and how often you need them. This information is not intended to replace advice given to you by your health care provider. Make sure you discuss any questions you have with your health care provider. Document Released: 06/19/2015 Document Revised: 02/10/2016 Document Reviewed: 03/24/2015 Elsevier Interactive Patient Education  2017 [place] Prevention in the Home Falls can cause injuries. They can happen to people of all ages. There are many things you can do to make your home safe and to help prevent falls. What can I do on the outside of my home?  Regularly fix the edges of walkways and driveways and fix any cracks.  Remove anything that might make you trip as you walk through a door, such as a raised step or threshold.  Trim any bushes or trees on the path to your home.  Use bright outdoor lighting.  Clear any walking paths of anything that might make someone trip, such as rocks or tools.  Regularly check to see if handrails are loose or broken. Make sure that both sides of any steps have handrails.  Any raised decks and porches should have guardrails on the edges.  Have any leaves, snow, or ice cleared regularly.  Use sand or salt on  walking paths during winter.  Clean up any spills in your garage right away. This includes oil or grease spills. What can I do in the bathroom?  Use night lights.  Install grab bars by the toilet and in the tub and shower. Do not use towel bars as grab bars.  Use non-skid mats or decals in the tub or shower.  If you need to sit down in the shower, use a plastic, non-slip stool.  Keep the floor dry. Clean up any water that spills on the floor as soon as it happens.  Remove soap buildup in the tub or shower regularly.  Attach bath mats securely with double-sided non-slip rug tape.  Do not have throw rugs and other things on the floor that can make you trip. What can I do in the bedroom?  Use night lights.  Make sure that you have a light by your bed that is easy to reach.  Do not use any sheets or blankets that are too big for your bed. They should not hang down onto the floor.  Have a firm chair that has side arms. You can use this for support while you get dressed.  Do not have throw rugs and other things on the floor that can make you trip. What can I do in the kitchen?  Clean up any spills right away.  Avoid walking on wet floors.  Keep items that you use a lot in easy-to-reach places.  If you need to reach something above you, use a strong step stool that has a grab bar.  Keep electrical cords out of the way.  Do not use floor polish or wax that makes floors slippery. If you must use wax, use non-skid floor wax.  Do not have throw rugs and other things on the floor that can make you trip. What can I do with my stairs?  Do not leave any items on the stairs.  Make sure that there are handrails on both sides of the stairs and use them. Fix handrails that are broken or loose. Make sure that handrails are as long as the stairways.  Check any carpeting to make sure that it is firmly attached to the stairs. Fix any carpet that is loose or worn.  Avoid having throw  rugs at the top or bottom of the stairs. If you do have throw rugs, attach them to the floor with carpet tape.  Make sure that you have a light switch at the top of the stairs and the bottom of the stairs. If you do not have them, ask someone to add them for you. What else can I do to help prevent falls?  Wear shoes that:  Do not have high heels.  Have rubber bottoms.  Are comfortable and fit you well.  Are closed at the toe. Do not wear sandals.  If you use a stepladder:  Make sure that it is fully opened. Do not climb a closed stepladder.  Make sure that both sides of the stepladder are locked into place.  Ask someone to hold it for you, if possible.  Clearly mark and make sure that you can see:  Any grab bars or handrails.  First and last steps.  Where the edge of each step is.  Use tools that help you move around (mobility aids) if they are needed. These include:  Canes.  Walkers.  Scooters.  Crutches.  Turn on the lights when you go into a dark area. Replace any light bulbs as soon as they burn out.  Set up your furniture so you have a clear path. Avoid moving your furniture around.  If any of your floors are uneven, fix them.  If there are any pets around you, be aware of where they are.  Review your medicines with your doctor. Some medicines can make you feel dizzy. This can increase your chance of falling. Ask your doctor what other things that you can do to help prevent falls. This information is not intended to replace advice given to you by your health care provider. Make sure you discuss any questions you have with your health care provider. Document Released: 03/19/2009 Document Revised: 10/29/2015 Document Reviewed: 06/27/2014 Elsevier Interactive Patient Education  2017 [employer].

## 2020-07-14 ENCOUNTER — Telehealth: Payer: Self-pay

## 2020-07-14 NOTE — Progress Notes (Signed)
Chronic Care Management Pharmacy Assistant   Name: Terri Burns  MRN: [id number] DOB: 04-Jan-1951  Reason for Encounter: Medication Review  Patient Questions:  1.  Have you seen any other providers since your last visit? No  2.  Any changes in your medicines or health? No   PCP : Steele Sizer, MD  Allergies:   Allergies  Allergen Reactions  . Levofloxacin Shortness Of Breath  . Penicillins Hives    itching  . Meloxicam     dizziness  . Nsaids Other (See Comments)    Patient states she can tolerate up to three doses per day without incident    Medications: Outpatient Encounter Medications as of 07/14/2020  Medication Sig Note  . albuterol (VENTOLIN HFA) 108 (90 Base) MCG/ACT inhaler Inhale 2 puffs into the lungs every 6 (six) hours as needed for wheezing or shortness of breath. 03/20/2019: Nebulizers   . aspirin 81 MG EC tablet TAKE 1 TABLET BY MOUTH EVERY DAY   . atorvastatin (LIPITOR) 20 MG tablet Take 1 tablet (20 mg total) by mouth daily.   . celecoxib (CELEBREX) 200 MG capsule Take 1 capsule (200 mg total) by mouth as needed.   . citalopram (CELEXA) 20 MG tablet Take 1 tablet (20 mg total) by mouth daily.   . fluticasone (FLONASE) 50 MCG/ACT nasal spray Place 2 sprays into both nostrils daily.   . Fluticasone-Umeclidin-Vilant (TRELEGY ELLIPTA) 100-62.5-25 MCG/INH AEPB Take 1 puff by mouth daily.   Marland Kitchen ipratropium-albuterol (DUONEB) 0.5-2.5 (3) MG/3ML SOLN Inhale 3 mLs into the lungs every 4 (four) hours as needed.   . loratadine (CLARITIN) 10 MG tablet Take 1 tablet (10 mg total) by mouth daily.   Marland Kitchen losartan (COZAAR) 50 MG tablet TAKE 1 TABLET BY MOUTH EVERY DAY   . metoprolol succinate (TOPROL-XL) 25 MG 24 hr tablet TAKE 1 TABLET BY MOUTH EVERY DAY   . pantoprazole (PROTONIX) 40 MG tablet Take 1 tablet (40 mg total) by mouth daily.    No facility-administered encounter medications on file as of 07/14/2020.    Current Diagnosis: Patient Active Problem List    Diagnosis Date Noted  . Skin lesion of face 05/01/2018  . Atherosclerosis of aorta ([place]) 03/04/2018  . Coronary artery calcification of native artery 03/04/2018  . 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 Chronic neck pain 01/01/2018  . Benign essential HTN 01/01/2018  . Senile purpura ([place]) 01/01/2018  . Recurrent major depressive disorder, in full remission ([place]) 01/01/2018  . Hyperglycemia 09/27/2016  . Tobacco abuse 08/20/2015  . Allergic rhinitis, seasonal 05/12/2015  . Emphysema lung ([place]) 05/12/2015  . Degeneration of intervertebral disc of cervical region 05/12/2015  . GERD (gastroesophageal reflux disease) 05/12/2015  . Major depression in remission ([place]) 05/12/2015    Goals Addressed   None    Reviewed chart for medication changes ahead of medication coordination call.  No OVs, Consults, or hospital visits since last care coordination call/Pharmacist visit.  No medication changes indicated   BP Readings from Last 3 Encounters:  02/21/20 (!) 200/112  09/17/19 120/74  06/14/19 (!) 137/111    No results found for: HGBA1C   Patient obtains medications through Adherence Packaging  30 Days   Last adherence delivery included:  Trelegy Aer 100 mcg Ellipta Losartan 50mg -take 1 tablet by mouth every day before breakfast Metoprolol 25 mg-take 1 tablet before breakfast Citalopram 20 mg-take one tablet in the evening Pantoprazole 40 mg- take one tablet before breakfast Atorvastatin 20 mg- take  one tablet daily before breakfast Loratadine 10 mg-take one tablet daily before breakfast  Patient declined medication last month: fluticasone nasal spray (PRN) Albuterol 108 mcg/Act (PRN) Aspirin 81 mg EC (OTC) Celecoxib- take 1 capsule by mouth (prn)  Patient is due for next adherence delivery on: 07/22/2020. Called patient and reviewed medications and coordinated delivery.  This delivery to include: Trelegy Aer 100 mcg Ellipta Losartan 50mg -take 1 tablet by  mouth every day before breakfast Metoprolol 25 mg-take 1 tablet before breakfast Citalopram 20 mg-take one tablet in the evening Pantoprazole 40 mg- take one tablet before breakfast Atorvastatin 20 mg- take one tablet daily before breakfast Loratadine 10 mg-take one tablet daily before breakfast  Patient will not  need a short fill of medication, prior to adherence delivery.  Patient will not need a  acute fill of medication, prior to adherence delivery.  Patient declined the following medications: fluticasone nasal spray (PRN) Albuterol 108 mcg/Act (PRN) Aspirin 81 mg EC (OTC) Celecoxib- take 1 capsule by mouth (prn)  Patient needs refills for None ID .  Confirmed delivery date of 07/22/2020, advised patient that pharmacy will contact them the morning of delivery.  Follow-Up:  Pharmacist Review   Bessie [place] Pharmacist Assistant [phone]

## 2020-08-03 ENCOUNTER — Ambulatory Visit (INDEPENDENT_AMBULATORY_CARE_PROVIDER_SITE_OTHER): Payer: Medicare Other | Admitting: Dermatology

## 2020-08-03 ENCOUNTER — Other Ambulatory Visit: Payer: Self-pay

## 2020-08-03 DIAGNOSIS — L570 Actinic keratosis: Secondary | ICD-10-CM | POA: Diagnosis not present

## 2020-08-03 DIAGNOSIS — Z85828 Personal history of other malignant neoplasm of skin: Secondary | ICD-10-CM

## 2020-08-03 DIAGNOSIS — L578 Other skin changes due to chronic exposure to nonionizing radiation: Secondary | ICD-10-CM | POA: Diagnosis not present

## 2020-08-03 NOTE — Patient Instructions (Signed)
Melanoma ABCDEs  Melanoma is the most dangerous type of skin cancer, and is the leading cause of death from skin disease.  You are more likely to develop melanoma if you:  Have light-colored skin, light-colored eyes, or red or blond hair  Spend a lot of time in the sun  Tan regularly, either outdoors or in a tanning bed  Have had blistering sunburns, especially during childhood  Have a close family member who has had a melanoma  Have atypical moles or large birthmarks  Early detection of melanoma is key since treatment is typically straightforward and cure rates are extremely high if we catch it early.   The first sign of melanoma is often a change in a mole or a new dark spot.  The ABCDE system is a way of remembering the signs of melanoma.  A for asymmetry:  The two halves do not match. B for border:  The edges of the growth are irregular. C for color:  A mixture of colors are present instead of an even brown color. D for diameter:  Melanomas are usually (but not always) greater than 6mm - the size of a pencil eraser. E for evolution:  The spot keeps changing in size, shape, and color.  Please check your skin once per month between visits. You can use a small mirror in front and a large mirror behind you to keep an eye on the back side or your body.   If you see any new or changing lesions before your next follow-up, please call to schedule a visit.  Please continue daily skin protection including broad spectrum sunscreen SPF 30+ to sun-exposed areas, reapplying every 2 hours as needed when you're outdoors.   Cryotherapy Aftercare  . Wash gently with soap and water everyday.   . Apply Vaseline and Band-Aid daily until healed.   

## 2020-08-03 NOTE — Progress Notes (Signed)
   Follow-Up Visit   Subjective  Terri Burns is a 70 y.o. female who presents for the following: Follow-up (Patient here today for 2 month follow up. 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 No new or changing spots patient is aware of. ).  The following portions of the chart were reviewed this encounter and updated as appropriate:   Tobacco  Allergies  Meds  Problems  Med Hx  Surg Hx  Fam Hx     Review of Systems:  No other skin or systemic complaints except as noted in HPI or Assessment and Plan.  Objective  Well appearing patient in no apparent distress; mood and affect are within normal limits.  A focused examination was performed including face. Relevant physical exam findings are noted in the Assessment and Plan.  Objective  right temple x 1, right brow x 1 (2): Erythematous thin papules/macules with gritty scale.    Assessment & Plan  AK (actinic keratosis) (2) right temple x 1, right brow x 1  Destruction of lesion - right temple x 1, right brow x 1 Complexity: simple   Destruction method: cryotherapy   Informed consent: discussed and consent obtained   Timeout:  patient name, date of birth, surgical site, and procedure verified Lesion destroyed using liquid nitrogen: Yes   Region frozen until ice ball extended beyond lesion: Yes   Outcome: patient tolerated procedure well with no complications   Post-procedure details: wound care instructions given    Actinic Damage - chronic, secondary to cumulative UV radiation exposure/sun exposure over time - diffuse scaly erythematous macules with underlying dyspigmentation - Recommend daily broad spectrum sunscreen SPF 30+ to sun-exposed areas, reapply every 2 hours as needed.  - Call for new or changing lesions.  History of Squamous Cell Carcinoma of the Skin - No evidence of recurrence today at right temple with crust - No lymphadenopathy - Recommend regular full body skin exams - Recommend  daily broad spectrum sunscreen SPF 30+ to sun-exposed areas, reapply every 2 hours as needed.  - Call if any new or changing lesions are noted between office visits  Return in about 2 months (around 10/01/2020) for AK follow up.  Graciella Belton, RMA, am acting as scribe for Sarina Ser, MD . Documentation: I have reviewed the above documentation for accuracy and completeness, and I agree with the above.  Sarina Ser, MD

## 2020-08-04 ENCOUNTER — Encounter: Payer: Self-pay | Admitting: Dermatology

## 2020-08-10 ENCOUNTER — Other Ambulatory Visit: Payer: Self-pay | Admitting: Family Medicine

## 2020-08-10 DIAGNOSIS — I10 Essential (primary) hypertension: Secondary | ICD-10-CM

## 2020-08-10 NOTE — Telephone Encounter (Signed)
Pt coming in on 08-13-2020

## 2020-08-11 NOTE — Telephone Encounter (Signed)
Please close

## 2020-08-11 NOTE — Progress Notes (Signed)
Name: Terri Burns   MRN: [id number]    DOB: January 05, 1951   Date:08/12/2020       Progress Note  Subjective  Chief Complaint  Medication Refill  HPI  Chronic neck pain:she has been on disability since 2007,  had x-ray andMRI done, she has DDD.She fell March 2020 and fracture her c-spine at C2 level, she went to [facility] just [facility], but she fell down again a few days later and had to be admitted because of severe hypotension , hypoxia and had COPD exacerbation. She continues to haveconstantneck pain, wearing a neck color, still taking  Celebrex. She was referred to pain clinic but states only seen by  Dr. Cari Caraway at [facility]. She is off hydrocodone, she states never got rx of lyrica but would like to try it . .   HPV positive: explained that she needs to return for pap smear-she will schedule to follow up  HTN: she has been taking medications as prescribed, bp is elevated again today, she is willing to have medication adjusted. No chest pain or palpitation. No edema  Senile purpura: on upper extremity, worse spot on left hand   Emphysema lung: no recent flares/. She has sob with mild activity also has a daily productive cough but no wheezing. She has been on Trelegy and also uses nebulizer machine almost daily   Major depression: shewas in remission, taking citalopram, however lost her daughter May 2020 andwas grieving. She states she is okay now, looking forward to Spring   Atherosclerosis aorta and coronary vessels, seen on CT chest : she has been taking aspirin and atorvastatin, last LDL was at goal   Patient Active Problem List   Diagnosis Date Noted  . Skin lesion of face 05/01/2018  . Atherosclerosis of aorta ([place]) 03/04/2018  . Coronary artery calcification of native artery 03/04/2018  . 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 Chronic neck pain 01/01/2018  . Benign essential HTN 01/01/2018  . Senile purpura ([place]) 01/01/2018  . Recurrent major  depressive disorder, in full remission ([place]) 01/01/2018  . Hyperglycemia 09/27/2016  . Tobacco abuse 08/20/2015  . Allergic rhinitis, seasonal 05/12/2015  . Emphysema lung ([place]) 05/12/2015  . Degeneration of intervertebral disc of cervical region 05/12/2015  . GERD (gastroesophageal reflux disease) 05/12/2015  . Major depression in remission ([place]) 05/12/2015    Past Surgical History:  Procedure Laterality Date  . NECK SURGERY N/A 10/2003  . SHOULDER SURGERY Left 2004    Family History  Problem Relation Age of Onset  . Hypertension Mother   . Stroke Mother   . Heart disease Father   . Diabetes Father   . Heart disease Daughter   . Pulmonary embolism Daughter   . Hypertension Son     Social History   Tobacco Use  . Smoking status: Current Every Day Smoker    Packs/day: 0.60    Years: 45.00    Pack years: 27.00    Types: Cigarettes    Start date: 12/10/1973  . Smokeless tobacco: Never Used  . Tobacco comment: previous pack a day smoker  Substance Use Topics  . Alcohol use: Yes    Alcohol/week: 0.0 standard drinks    Comment: occasionally     Current Outpatient Medications:  .  albuterol (VENTOLIN HFA) 108 (90 Base) MCG/ACT inhaler, Inhale 2 puffs into the lungs every 6 (six) hours as needed for wheezing or shortness of breath., Disp: 18 Inhaler, Rfl: 2 .  aspirin 81 MG EC tablet,  TAKE 1 TABLET BY MOUTH EVERY DAY, Disp: 90 tablet, Rfl: 0 .  atorvastatin (LIPITOR) 20 MG tablet, Take 1 tablet (20 mg total) by mouth daily., Disp: 90 tablet, Rfl: 1 .  celecoxib (CELEBREX) 200 MG capsule, Take 1 capsule (200 mg total) by mouth as needed., Disp: 90 capsule, Rfl: 1 .  citalopram (CELEXA) 20 MG tablet, Take 1 tablet (20 mg total) by mouth daily., Disp: 90 tablet, Rfl: 1 .  fluticasone (FLONASE) 50 MCG/ACT nasal spray, Place 2 sprays into both nostrils daily., Disp: 48 mL, Rfl: 0 .  Fluticasone-Umeclidin-Vilant (TRELEGY ELLIPTA) 100-62.5-25 MCG/INH AEPB, Take 1 puff by mouth  daily., Disp: 60 each, Rfl: 5 .  ipratropium-albuterol (DUONEB) 0.5-2.5 (3) MG/3ML SOLN, Inhale 3 mLs into the lungs every 4 (four) hours as needed., Disp: 120 mL, Rfl: 5 .  loratadine (CLARITIN) 10 MG tablet, Take 1 tablet (10 mg total) by mouth daily., Disp: 90 tablet, Rfl: 0 .  losartan (COZAAR) 50 MG tablet, TAKE 1 TABLET BY MOUTH EVERY DAY, Disp: 90 tablet, Rfl: 1 .  metoprolol succinate (TOPROL-XL) 25 MG 24 hr tablet, TAKE 1 TABLET BY MOUTH EVERY DAY, Disp: 90 tablet, Rfl: 0 .  pantoprazole (PROTONIX) 40 MG tablet, Take 1 tablet (40 mg total) by mouth daily., Disp: 90 tablet, Rfl: 1  Allergies  Allergen Reactions  . Levofloxacin Shortness Of Breath  . Penicillins Hives    itching  . Meloxicam     dizziness  . Nsaids Other (See Comments)    Patient states she can tolerate up to three doses per day without incident    I personally reviewed active problem list, medication list, allergies, family history, social history, health maintenance with the patient/caregiver today.   ROS  Constitutional: Negative for fever or weight change.  Respiratory: Negative for cough and shortness of breath.   Cardiovascular: Negative for chest pain or palpitations.  Gastrointestinal: Negative for abdominal pain, no bowel changes.  Musculoskeletal: Negative for gait problem or joint swelling.  Skin: Negative for rash.  Neurological: Negative for dizziness or headache.  No other specific complaints in a complete review of systems (except as listed in HPI above).   Objective  Vitals:   08/12/20 1020 08/12/20 1029  BP: (!) 144/100 (!) 144/92  Pulse: 78   Resp: 16   Temp: 98.4 F (36.9 C)   TempSrc: Oral   SpO2: 93%   Weight: 132 lb (59.9 kg)   Height: 5' (1.524 m)     Body mass index is 25.78 kg/m.  Physical Exam  Constitutional: Patient appears well-developed and well-nourished.No distress.  HEENT: head atraumatic, normocephalic, pupils equal and reactive to light, still wearing a  neck color and has decrease rom of neck  Cardiovascular: Normal rate, regular rhythm and normal heart sounds.  No murmur heard. No BLE edema. Pulmonary/Chest: rhonchi and wheezing bilaterally No respiratory distress. Abdominal: Soft.  There is no tenderness. Skin: senile purpura on left hand also small excoriation noticed ( her dog jumped on her)  Psychiatric: Patient has a normal mood and affect. behavior is normal. Judgment and thought content normal.  PHQ2/9: Depression screen [facility] 2/9 08/12/2020 07/09/2020 02/21/2020 09/17/2019 07/09/2019  Decreased Interest 1 0 0 0 0  Down, Depressed, Hopeless 0 0 0 0 0  PHQ - 2 Score 1 0 0 0 0  Altered sleeping 0 - 0 0 -  Tired, decreased energy 1 - 1 0 -  Change in appetite 0 - 0 0 -  Feeling bad or  failure about yourself  0 - 0 0 -  Trouble concentrating 0 - 0 0 -  Moving slowly or fidgety/restless 0 - 0 0 -  Suicidal thoughts 0 - 0 0 -  PHQ-9 Score 2 - 1 0 -  Difficult doing work/chores - - Not difficult at all Not difficult at all -  Some recent data might be hidden    phq 9 is positive    Fall Risk: Fall Risk  08/12/2020 07/09/2020 02/21/2020 09/17/2019 07/09/2019  Falls in the past year? 0 0 0 0 1  Number falls in past yr: 0 0 0 0 1  Injury with Fall? 0 0 0 0 1  Comment - - - - -  Risk for fall due to : - Impaired balance/gait - - History of fall(s);Impaired vision;Orthopedic patient  Risk for fall due to: Comment - - - - -  Follow up - Falls prevention discussed - Falls evaluation completed Falls prevention discussed     Functional Status Survey: Is the patient deaf or have difficulty hearing?: No Does the patient have difficulty seeing, even when wearing glasses/contacts?: Yes Does the patient have difficulty concentrating, remembering, or making decisions?: No Does the patient have difficulty walking or climbing stairs?: Yes Does the patient have difficulty dressing or bathing?: Yes Does the patient have difficulty doing errands alone such  as visiting a doctor's office or shopping?: No     Assessment & Plan  1. Colon cancer screening  Fit test   2. Atherosclerosis of aorta (HCC)  - atorvastatin (LIPITOR) 20 MG tablet; Take 1 tablet (20 mg total) by mouth daily.  Dispense: 90 tablet; Refill: 1  3. Coronary artery calcification of native artery  - atorvastatin (LIPITOR) 20 MG tablet; Take 1 tablet (20 mg total) by mouth daily.  Dispense: 90 tablet; Refill: 1  4. Essential hypertension  - losartan (COZAAR) 100 MG tablet; Take 1 tablet (100 mg total) by mouth daily.  Dispense: 90 tablet; Refill: 1 - metoprolol succinate (TOPROL-XL) 25 MG 24 hr tablet; Take 1 tablet (25 mg total) by mouth daily.  Dispense: 90 tablet; Refill: 1  5. Gastroesophageal reflux disease without esophagitis  - pantoprazole (PROTONIX) 40 MG tablet; Take 1 tablet (40 mg total) by mouth daily.  Dispense: 90 tablet; Refill: 1  6. Senile purpura ([place])  Reassurance given   7. Perennial allergic rhinitis  - loratadine (CLARITIN) 10 MG tablet; Take 1 tablet (10 mg total) by mouth daily.  Dispense: 90 tablet; Refill: 1  8. Mild recurrent major depression (HCC)  Continue celebrex, discussed staying near sunlight   9. Centrilobular emphysema (HCC)  - ipratropium-albuterol (DUONEB) 0.5-2.5 (3) MG/3ML SOLN; Inhale 3 mLs into the lungs every 4 (four) hours as needed.  Dispense: 120 mL; Refill: 5  10. Chronic neck pain  - celecoxib (CELEBREX) 200 MG capsule; Take 1 capsule (200 mg total) by mouth as needed.  Dispense: 90 capsule; Refill: 1

## 2020-08-12 ENCOUNTER — Encounter: Payer: Self-pay | Admitting: Family Medicine

## 2020-08-12 ENCOUNTER — Other Ambulatory Visit: Payer: Self-pay

## 2020-08-12 ENCOUNTER — Ambulatory Visit (INDEPENDENT_AMBULATORY_CARE_PROVIDER_SITE_OTHER): Payer: Medicare Other | Admitting: Family Medicine

## 2020-08-12 ENCOUNTER — Other Ambulatory Visit (INDEPENDENT_AMBULATORY_CARE_PROVIDER_SITE_OTHER): Payer: Medicare Other

## 2020-08-12 VITALS — BP 144/92 | HR 78 | Temp 98.4°F | Resp 16 | Ht 60.0 in | Wt 132.0 lb

## 2020-08-12 DIAGNOSIS — I10 Essential (primary) hypertension: Secondary | ICD-10-CM | POA: Diagnosis not present

## 2020-08-12 DIAGNOSIS — Z789 Other specified health status: Secondary | ICD-10-CM | POA: Diagnosis not present

## 2020-08-12 DIAGNOSIS — Z1211 Encounter for screening for malignant neoplasm of colon: Secondary | ICD-10-CM

## 2020-08-12 DIAGNOSIS — F330 Major depressive disorder, recurrent, mild: Secondary | ICD-10-CM | POA: Diagnosis not present

## 2020-08-12 DIAGNOSIS — I2510 Atherosclerotic heart disease of native coronary artery without angina pectoris: Secondary | ICD-10-CM

## 2020-08-12 DIAGNOSIS — I2584 Coronary atherosclerosis due to calcified coronary lesion: Secondary | ICD-10-CM

## 2020-08-12 DIAGNOSIS — D692 Other nonthrombocytopenic purpura: Secondary | ICD-10-CM | POA: Diagnosis not present

## 2020-08-12 DIAGNOSIS — J3089 Other allergic rhinitis: Secondary | ICD-10-CM | POA: Diagnosis not present

## 2020-08-12 DIAGNOSIS — G8929 Other chronic pain: Secondary | ICD-10-CM | POA: Diagnosis not present

## 2020-08-12 DIAGNOSIS — M542 Cervicalgia: Secondary | ICD-10-CM | POA: Diagnosis not present

## 2020-08-12 DIAGNOSIS — I700 Atherosclerosis of aorta: Secondary | ICD-10-CM | POA: Diagnosis not present

## 2020-08-12 DIAGNOSIS — K219 Gastro-esophageal reflux disease without esophagitis: Secondary | ICD-10-CM | POA: Diagnosis not present

## 2020-08-12 DIAGNOSIS — J432 Centrilobular emphysema: Secondary | ICD-10-CM

## 2020-08-12 MED ORDER — PREGABALIN 50 MG PO CAPS
50.0000 mg | ORAL_CAPSULE | Freq: Three times a day (TID) | ORAL | 0 refills | Status: DC
Start: 2020-08-12 — End: 2021-02-06

## 2020-08-12 MED ORDER — LORATADINE 10 MG PO TABS: 10.0000 mg | ORAL_TABLET | Freq: Every day | ORAL | 1 refills | Status: DC

## 2020-08-12 MED ORDER — CELECOXIB 200 MG PO CAPS: 200.0000 mg | ORAL_CAPSULE | ORAL | 1 refills | Status: DC | PRN

## 2020-08-12 MED ORDER — ATORVASTATIN CALCIUM 20 MG PO TABS: 20.0000 mg | ORAL_TABLET | Freq: Every day | ORAL | 1 refills | Status: DC

## 2020-08-12 MED ORDER — CITALOPRAM HYDROBROMIDE 20 MG PO TABS: 20.0000 mg | ORAL_TABLET | Freq: Every day | ORAL | 1 refills | Status: DC

## 2020-08-12 MED ORDER — METOPROLOL SUCCINATE ER 25 MG PO TB24: 25.0000 mg | ORAL_TABLET | Freq: Every day | ORAL | 1 refills | Status: DC

## 2020-08-12 MED ORDER — TRELEGY ELLIPTA 100-62.5-25 MCG/INH IN AEPB: 1.0000 | INHALATION_SPRAY | Freq: Every day | RESPIRATORY_TRACT | 5 refills | Status: DC

## 2020-08-12 MED ORDER — LOSARTAN POTASSIUM 100 MG PO TABS: 100.0000 mg | ORAL_TABLET | Freq: Every day | ORAL | 1 refills | Status: DC

## 2020-08-12 MED ORDER — IPRATROPIUM-ALBUTEROL 0.5-2.5 (3) MG/3ML IN SOLN: 3.0000 mL | RESPIRATORY_TRACT | 5 refills | Status: DC | PRN

## 2020-08-12 MED ORDER — PANTOPRAZOLE SODIUM 40 MG PO TBEC: 40.0000 mg | DELAYED_RELEASE_TABLET | Freq: Every day | ORAL | 1 refills | Status: DC

## 2020-08-13 ENCOUNTER — Telehealth: Payer: Self-pay

## 2020-08-13 NOTE — Progress Notes (Signed)
Chronic Care Management Pharmacy Assistant   Name: Terri Burns  MRN: [id number] DOB: 03-Apr-1951   Reason for Encounter: Medication Review   Primary concerns for visit include: Dispensing medication call.   Recent office visits:  08/12/2020 PCP Steele Sizer ,Increase Losartan from 50 mg to 100 mg one tablet daily Started Pregabalin 50 mg - start off with one  capsule in the morning and evening for the first week then go to one caspule three times a day.(Vials) .  Recent consult visits:  08/03/2020 [facility] visits:  None in previous 6 months  Medications: Outpatient Encounter Medications as of 08/13/2020  Medication Sig Note   albuterol (VENTOLIN HFA) 108 (90 Base) MCG/ACT inhaler Inhale 2 puffs into the lungs every 6 (six) hours as needed for wheezing or shortness of breath. 03/20/2019: Nebulizers    aspirin 81 MG EC tablet TAKE 1 TABLET BY MOUTH EVERY DAY    atorvastatin (LIPITOR) 20 MG tablet Take 1 tablet (20 mg total) by mouth daily.    celecoxib (CELEBREX) 200 MG capsule Take 1 capsule (200 mg total) by mouth as needed.    citalopram (CELEXA) 20 MG tablet Take 1 tablet (20 mg total) by mouth daily.    fluticasone (FLONASE) 50 MCG/ACT nasal spray Place 2 sprays into both nostrils daily.    Fluticasone-Umeclidin-Vilant (TRELEGY ELLIPTA) 100-62.5-25 MCG/INH AEPB Take 1 puff by mouth daily.    ipratropium-albuterol (DUONEB) 0.5-2.5 (3) MG/3ML SOLN Inhale 3 mLs into the lungs every 4 (four) hours as needed.    loratadine (CLARITIN) 10 MG tablet Take 1 tablet (10 mg total) by mouth daily.    losartan (COZAAR) 100 MG tablet Take 1 tablet (100 mg total) by mouth daily.    metoprolol succinate (TOPROL-XL) 25 MG 24 hr tablet Take 1 tablet (25 mg total) by mouth daily.    pantoprazole (PROTONIX) 40 MG tablet Take 1 tablet (40 mg total) by mouth daily.    pregabalin (LYRICA) 50 MG capsule Take 1 capsule (50 mg total) by mouth 3 (three)  times daily. Start with one qpm and increase dosage by one capsule every 3rd day max of three capsules daily    No facility-administered encounter medications on file as of 08/13/2020.    Star Rating Drugs:atorvastatin,losartan  Reviewed chart for medication changes ahead of medication coordination call.   BP Readings from Last 3 Encounters:  08/12/20 (!) 144/92  02/21/20 (!) 200/112  09/17/19 120/74    No results found for: HGBA1C   Patient obtains medications through Adherence Packaging  30 Days   Last adherence delivery included:  Trelegy Aer 100 mcg Ellipta Losartan 50mg -take 1 tablet by mouth every day before breakfast Metoprolol 25 mg-take 1 tablet before breakfast Citalopram 20 mg-take one tablet in the evening Pantoprazole 40 mg- take one tablet before breakfast Atorvastatin 20 mg- take one tablet daily before breakfast Loratadine 10 mg-take one tablet daily before breakfast  Patient declined medications last month  fluticasone nasal spray (PRN) Albuterol 108 mcg/Act (PRN) Aspirin 81 mg EC (OTC) Celecoxib- take 1 capsule by mouth (prn) Patient is due for next adherence delivery on: 08/21/2020. Called patient and reviewed medications and coordinated delivery.  This delivery to include: Trelegy Aer 100 mcg Ellipta Losartan 100 mg-take 1 tablet by mouth every day before breakfast Metoprolol 25 mg-take 1 tablet before breakfast Citalopram 20 mg-take one tablet in the evening Pantoprazole 40 mg- take one tablet before breakfast Atorvastatin 20 mg- take one tablet daily  before breakfast Loratadine 10 mg-take one tablet daily before breakfast Pregabalin 50 mg - start off with one  capsule in the morning and evening for the first week then go to one capsule three times a day.(Vials) Ipratropium-albuterol 0.5-2.5 MG/ML Solution -Inhale 3 mLs into the lungs every 4 (four) hours as needed.  Patient will not need a short fill of medication, prior to adherence  delivery. Patient will not need a acute fill of medication, prior to adherence delivery   Patient declined the following medications: fluticasone nasal spray (PRN)(has one bottle on hand as of 08/13/2020) Albuterol 108 mcg/Act (PRN) Aspirin 81 mg EC (OTC) Celecoxib- take 1 capsule by mouth (prn)  Patient needs refills for None ID .  Confirmed delivery date of 08/21/2020, advised patient that pharmacy will contact them the morning of delivery.  Blood pressure reading: Patient states her blood pressure has been high, but does not keep a log.\  [place] Clinical Pharmacist Assistant [phone]

## 2020-08-20 ENCOUNTER — Telehealth: Payer: Self-pay

## 2020-08-20 NOTE — Chronic Care Management (AMB) (Signed)
08/20/2020- [facility] received notification that patient Terri Burns medications need prior authorization. Will call office to follow up on prior authorization.  Pattricia Boss, [place]

## 2020-09-03 ENCOUNTER — Telehealth: Payer: Self-pay

## 2020-09-03 NOTE — Telephone Encounter (Signed)
Patient notified that lung screening imaging is due currently or in the near future. Patient is scheduled for 10/08/20 @ 3:30 .  Patient is a current smoker, smoking 0.60 packs per day for the past 47 years.

## 2020-09-08 ENCOUNTER — Other Ambulatory Visit: Payer: Self-pay | Admitting: *Deleted

## 2020-09-08 DIAGNOSIS — Z87891 Personal history of nicotine dependence: Secondary | ICD-10-CM

## 2020-09-08 DIAGNOSIS — F17200 Nicotine dependence, unspecified, uncomplicated: Secondary | ICD-10-CM

## 2020-09-08 DIAGNOSIS — Z122 Encounter for screening for malignant neoplasm of respiratory organs: Secondary | ICD-10-CM

## 2020-09-08 NOTE — Progress Notes (Signed)
Contacted and scheduled for annual lung screening scan. Patient is a current smoker with a 53.5 pack year history.

## 2020-09-11 ENCOUNTER — Telehealth: Payer: Self-pay

## 2020-09-11 NOTE — Progress Notes (Signed)
Chronic Care Management Pharmacy Assistant   Name: Terri Burns  MRN: [id number] DOB: 1951-04-05  Reason for Encounter: Medication Review/Medication coordination Call.   Recent office visits:  No recent Office Visit  Recent consult visits:  No recent Consult visit  Hospital visits:  Medication Reconciliation was completed by comparing discharge summary, patient's EMR and Pharmacy list, and upon discussion with patient.  Admitted to the hospital on 09/13/2020 due to COPD exacerbation. Discharge date was 09/15/2020. Discharged from [place]?Medications Started at [facility] Discharge:?? -started Amlodipine 5 mg due to Hypertension elevated -Started Nicotine 21 mg patch Medication Changes at Hospital Discharge: -Changed none  Medications Discontinued at Hospital Discharge: -Stopped Celecoxib 200 mg   Medications that remain the same after Hospital Discharge:??  -All other medications will remain the same.    Medications: Outpatient Encounter Medications as of 09/11/2020  Medication Sig Note  . albuterol (VENTOLIN HFA) 108 (90 Base) MCG/ACT inhaler Inhale 2 puffs into the lungs every 6 (six) hours as needed for wheezing or shortness of breath. 03/20/2019: Nebulizers   . aspirin 81 MG EC tablet TAKE 1 TABLET BY MOUTH EVERY DAY   . atorvastatin (LIPITOR) 20 MG tablet Take 1 tablet (20 mg total) by mouth daily.   . celecoxib (CELEBREX) 200 MG capsule Take 1 capsule (200 mg total) by mouth as needed.   . citalopram (CELEXA) 20 MG tablet Take 1 tablet (20 mg total) by mouth daily.   . fluticasone (FLONASE) 50 MCG/ACT nasal spray Place 2 sprays into both nostrils daily.   . Fluticasone-Umeclidin-Vilant (TRELEGY ELLIPTA) 100-62.5-25 MCG/INH AEPB Take 1 puff by mouth daily.   Marland Kitchen ipratropium-albuterol (DUONEB) 0.5-2.5 (3) MG/3ML SOLN Inhale 3 mLs into the lungs every 4 (four) hours as needed.   . loratadine (CLARITIN) 10 MG tablet Take 1 tablet (10 mg total) by  mouth daily.   Marland Kitchen losartan (COZAAR) 100 MG tablet Take 1 tablet (100 mg total) by mouth daily.   . metoprolol succinate (TOPROL-XL) 25 MG 24 hr tablet Take 1 tablet (25 mg total) by mouth daily.   . pantoprazole (PROTONIX) 40 MG tablet Take 1 tablet (40 mg total) by mouth daily.   . pregabalin (LYRICA) 50 MG capsule Take 1 capsule (50 mg total) by mouth 3 (three) times daily. Start with one qpm and increase dosage by one capsule every 3rd day max of three capsules daily    No facility-administered encounter medications on file as of 09/11/2020.    Star Rating Drugs: Atorvastatin 20 MG last filled on 08/19/2020 [facility]. Losartan 100 mg last filled on 08/19/2020 [facility].  Reviewed chart for medication changes ahead of medication coordination call.  BP Readings from Last 3 Encounters:  08/12/20 (!) 144/92  02/21/20 (!) 200/112  09/17/19 120/74    No results found for: HGBA1C   Patient obtains medications through Adherence Packaging  30 Days   Last adherence delivery included:   Trelegy Aer 100 mcg Ellipta  Losartan 100 mg-take 1 tablet by mouth every day before breakfast  Metoprolol 25 mg-take 1 tablet before breakfast  Citalopram 20 mg-take one tablet in the evening  Pantoprazole 40 mg- take one tablet before breakfast  Atorvastatin 20 mg- take one tablet daily before breakfast  Loratadine 10 mg-take one tablet daily before breakfast  Pregabalin 50 mg - start off with one  capsule in the morning and evening for the first  week then go to one capsule three times a day.(Vials)  Ipratropium-albuterol 0.5-2.5 MG/ML Solution -Inhale 3 mLs into the lungs every 4 (four) hours as needed.  Patient declined medications last month:  fluticasone nasal spray (PRN)(has one bottle on hand as of 08/13/2020) Albuterol 108 mcg/Act (PRN)  Aspirin 81 mg EC (OTC)  Celecoxib- take 1 capsule by mouth (prn)  Patient is due for next  adherence delivery on: 09/18/2020. Called patient and reviewed medications and coordinated delivery.   This delivery to include:  Trelegy Aer 100 mcg Ellipta  Losartan 100 mg-take 1 tablet by mouth every day before breakfast  Metoprolol 25 mg-take 1 tablet before breakfast  Citalopram 20 mg-take one tablet in the evening  Pantoprazole 40 mg- take one tablet before breakfast  Atorvastatin 20 mg- take one tablet daily before breakfast  Loratadine 10 mg-take one tablet daily before breakfast  Ipratropium-albuterol 0.5-2.5 MG/ML Solution -Inhale 3 mLs into the lungs every 4 (four) hours as needed.    Patient declined the following medications   fluticasone nasal spray (PRN)(Patient states patient receive a bottle from the hospital)  Albuterol 108 mcg/Act (PRN)  Aspirin 81 mg EC (OTC)  Celecoxib- Not Taking  Pregabalin 50 mg -capsule three times a day-Patient stop taking  (patient states she does not want to take pregabalin because it is making her feel "bad and funny feeling".  Amlodipine 5 mg one tablet daily before breakfast -  patient receive 30 day supply from [facility] on  09/16/2020.  Nicotine patch (patient receive 30 day supply from [facility] pharmacy on 09/16/2020.  Patient needs refills for None ID   .  Confirmed delivery date of 09/16/2020, advised patient that pharmacy will contact them the morning of delivery.  [place] Pharmacist Assistant [phone]

## 2020-09-13 ENCOUNTER — Inpatient Hospital Stay
Admission: EM | Admit: 2020-09-13 | Discharge: 2020-09-15 | DRG: 190 | Disposition: A | Discharge status: Home Health | Payer: Medicare Other | Attending: Internal Medicine | Admitting: Internal Medicine

## 2020-09-13 ENCOUNTER — Other Ambulatory Visit: Payer: Self-pay

## 2020-09-13 ENCOUNTER — Emergency Department: Payer: Medicare Other

## 2020-09-13 ENCOUNTER — Encounter: Payer: Self-pay | Admitting: Internal Medicine

## 2020-09-13 DIAGNOSIS — Z880 Allergy status to penicillin: Secondary | ICD-10-CM | POA: Diagnosis not present

## 2020-09-13 DIAGNOSIS — Z79899 Other long term (current) drug therapy: Secondary | ICD-10-CM | POA: Diagnosis not present

## 2020-09-13 DIAGNOSIS — R0602 Shortness of breath: Secondary | ICD-10-CM

## 2020-09-13 DIAGNOSIS — Z886 Allergy status to analgesic agent status: Secondary | ICD-10-CM | POA: Diagnosis not present

## 2020-09-13 DIAGNOSIS — M8448XD Pathological fracture, other site, subsequent encounter for fracture with routine healing: Secondary | ICD-10-CM | POA: Diagnosis present

## 2020-09-13 DIAGNOSIS — Z85828 Personal history of other malignant neoplasm of skin: Secondary | ICD-10-CM | POA: Diagnosis not present

## 2020-09-13 DIAGNOSIS — J984 Other disorders of lung: Secondary | ICD-10-CM | POA: Diagnosis not present

## 2020-09-13 DIAGNOSIS — M532X2 Spinal instabilities, cervical region: Secondary | ICD-10-CM | POA: Diagnosis present

## 2020-09-13 DIAGNOSIS — I10 Essential (primary) hypertension: Secondary | ICD-10-CM | POA: Diagnosis not present

## 2020-09-13 DIAGNOSIS — Z7982 Long term (current) use of aspirin: Secondary | ICD-10-CM

## 2020-09-13 DIAGNOSIS — R519 Headache, unspecified: Secondary | ICD-10-CM | POA: Diagnosis present

## 2020-09-13 DIAGNOSIS — E785 Hyperlipidemia, unspecified: Secondary | ICD-10-CM | POA: Diagnosis present

## 2020-09-13 DIAGNOSIS — Z8249 Family history of ischemic heart disease and other diseases of the circulatory system: Secondary | ICD-10-CM | POA: Diagnosis not present

## 2020-09-13 DIAGNOSIS — J9601 Acute respiratory failure with hypoxia: Secondary | ICD-10-CM

## 2020-09-13 DIAGNOSIS — Z881 Allergy status to other antibiotic agents status: Secondary | ICD-10-CM | POA: Diagnosis not present

## 2020-09-13 DIAGNOSIS — Z720 Tobacco use: Secondary | ICD-10-CM | POA: Diagnosis not present

## 2020-09-13 DIAGNOSIS — Z7951 Long term (current) use of inhaled steroids: Secondary | ICD-10-CM

## 2020-09-13 DIAGNOSIS — J441 Chronic obstructive pulmonary disease with (acute) exacerbation: Secondary | ICD-10-CM | POA: Diagnosis present

## 2020-09-13 DIAGNOSIS — I700 Atherosclerosis of aorta: Secondary | ICD-10-CM | POA: Diagnosis not present

## 2020-09-13 DIAGNOSIS — K219 Gastro-esophageal reflux disease without esophagitis: Secondary | ICD-10-CM | POA: Diagnosis not present

## 2020-09-13 DIAGNOSIS — F17210 Nicotine dependence, cigarettes, uncomplicated: Secondary | ICD-10-CM | POA: Diagnosis present

## 2020-09-13 DIAGNOSIS — Z20822 Contact with and (suspected) exposure to covid-19: Secondary | ICD-10-CM | POA: Diagnosis present

## 2020-09-13 DIAGNOSIS — F32A Depression, unspecified: Secondary | ICD-10-CM | POA: Diagnosis present

## 2020-09-13 DIAGNOSIS — R062 Wheezing: Secondary | ICD-10-CM | POA: Diagnosis not present

## 2020-09-13 LAB — CBC
HCT: 39.9 % (ref 36.0–46.0)
Hemoglobin: 13.1 g/dL (ref 12.0–15.0)
MCH: 29 pg (ref 26.0–34.0)
MCHC: 32.8 g/dL (ref 30.0–36.0)
MCV: 88.3 fL (ref 80.0–100.0)
Platelets: 254 10*3/uL (ref 150–400)
RBC: 4.52 MIL/uL (ref 3.87–5.11)
RDW: 14.9 % (ref 11.5–15.5)
WBC: 9.6 10*3/uL (ref 4.0–10.5)
nRBC: 0 % (ref 0.0–0.2)

## 2020-09-13 LAB — BASIC METABOLIC PANEL
Anion gap: 8 (ref 5–15)
BUN: 17 mg/dL (ref 8–23)
CO2: 20 mmol/L — ABNORMAL LOW (ref 22–32)
Calcium: 8.2 mg/dL — ABNORMAL LOW (ref 8.9–10.3)
Chloride: 108 mmol/L (ref 98–111)
Creatinine, Ser: 0.83 mg/dL (ref 0.44–1.00)
GFR, Estimated: >60 mL/min (ref >60)
Glucose, Bld: 108 mg/dL — ABNORMAL HIGH (ref 70–99)
Potassium: 4.8 mmol/L (ref 3.5–5.1)
Sodium: 136 mmol/L (ref 135–145)

## 2020-09-13 LAB — BRAIN NATRIURETIC PEPTIDE: B Natriuretic Peptide: 181.8 pg/mL — ABNORMAL HIGH (ref 0.0–100.0)

## 2020-09-13 LAB — RESP PANEL BY RT-PCR (FLU A&B, COVID) ARPGX2
Influenza A by PCR: NEGATIVE
Influenza B by PCR: NEGATIVE
SARS Coronavirus 2 by RT PCR: NEGATIVE

## 2020-09-13 LAB — TROPONIN I (HIGH SENSITIVITY): Troponin I (High Sensitivity): 10 ng/L (ref <18)

## 2020-09-13 MED ORDER — FLUTICASONE PROPIONATE 50 MCG/ACT NA SUSP
2.0000 | Freq: Every day | NASAL | Status: DC
  Administered 2020-09-14 – 2020-09-15 (×2): 2 via NASAL
  Filled 2020-09-13: qty 16

## 2020-09-13 MED ORDER — ACETAMINOPHEN 325 MG PO TABS
650.0000 mg | ORAL_TABLET | Freq: Once | ORAL | Status: AC
  Administered 2020-09-13: 650 mg via ORAL
  Filled 2020-09-13: qty 2

## 2020-09-13 MED ORDER — CELECOXIB 200 MG PO CAPS
200.0000 mg | ORAL_CAPSULE | Freq: Every day | ORAL | Status: DC | PRN
  Filled 2020-09-13: qty 1

## 2020-09-13 MED ORDER — SODIUM CHLORIDE 0.9 % IV BOLUS
500.0000 mL | Freq: Once | INTRAVENOUS | Status: AC
Start: 2020-09-13 — End: 2020-09-13
  Administered 2020-09-13: 500 mL via INTRAVENOUS

## 2020-09-13 MED ORDER — OXYCODONE-ACETAMINOPHEN 5-325 MG PO TABS: 1.0000 | ORAL_TABLET | ORAL | Status: DC | PRN

## 2020-09-13 MED ORDER — ATORVASTATIN CALCIUM 20 MG PO TABS
20.0000 mg | ORAL_TABLET | Freq: Every evening | ORAL | Status: DC
  Administered 2020-09-13 – 2020-09-14 (×2): 20 mg via ORAL
  Filled 2020-09-13 (×2): qty 1

## 2020-09-13 MED ORDER — LOSARTAN POTASSIUM 50 MG PO TABS
100.0000 mg | ORAL_TABLET | Freq: Every day | ORAL | Status: DC
  Administered 2020-09-13 – 2020-09-15 (×3): 100 mg via ORAL
  Filled 2020-09-13 (×3): qty 2

## 2020-09-13 MED ORDER — ONDANSETRON HCL 4 MG/2ML IJ SOLN: 4.0000 mg | Freq: Three times a day (TID) | INTRAMUSCULAR | Status: DC | PRN

## 2020-09-13 MED ORDER — ALBUTEROL SULFATE (2.5 MG/3ML) 0.083% IN NEBU: 2.5000 mg | INHALATION_SOLUTION | RESPIRATORY_TRACT | Status: DC | PRN

## 2020-09-13 MED ORDER — DM-GUAIFENESIN ER 30-600 MG PO TB12: 1.0000 | ORAL_TABLET | Freq: Two times a day (BID) | ORAL | Status: DC | PRN

## 2020-09-13 MED ORDER — METHYLPREDNISOLONE SODIUM SUCC 40 MG IJ SOLR
40.0000 mg | Freq: Two times a day (BID) | INTRAMUSCULAR | Status: DC
  Administered 2020-09-14 – 2020-09-15 (×2): 40 mg via INTRAVENOUS
  Filled 2020-09-13 (×2): qty 1

## 2020-09-13 MED ORDER — IPRATROPIUM-ALBUTEROL 0.5-2.5 (3) MG/3ML IN SOLN
3.0000 mL | RESPIRATORY_TRACT | Status: DC
  Administered 2020-09-13 – 2020-09-14 (×7): 3 mL via RESPIRATORY_TRACT
  Filled 2020-09-13 (×7): qty 3

## 2020-09-13 MED ORDER — FLUTICASONE-UMECLIDIN-VILANT 100-62.5-25 MCG/INH IN AEPB: 1.0000 | INHALATION_SPRAY | Freq: Every day | RESPIRATORY_TRACT | Status: DC

## 2020-09-13 MED ORDER — HYDRALAZINE HCL 20 MG/ML IJ SOLN
5.0000 mg | INTRAMUSCULAR | Status: DC | PRN
  Administered 2020-09-13 – 2020-09-15 (×3): 5 mg via INTRAVENOUS
  Filled 2020-09-13 (×3): qty 1

## 2020-09-13 MED ORDER — IPRATROPIUM-ALBUTEROL 0.5-2.5 (3) MG/3ML IN SOLN
3.0000 mL | Freq: Once | RESPIRATORY_TRACT | Status: AC
Start: 2020-09-13 — End: 2020-09-13
  Administered 2020-09-13: 3 mL via RESPIRATORY_TRACT
  Filled 2020-09-13: qty 3

## 2020-09-13 MED ORDER — LORATADINE 10 MG PO TABS
10.0000 mg | ORAL_TABLET | Freq: Every day | ORAL | Status: DC
  Administered 2020-09-13 – 2020-09-15 (×3): 10 mg via ORAL
  Filled 2020-09-13 (×3): qty 1

## 2020-09-13 MED ORDER — ASPIRIN EC 81 MG PO TBEC
81.0000 mg | DELAYED_RELEASE_TABLET | Freq: Every day | ORAL | Status: DC
  Administered 2020-09-13 – 2020-09-15 (×3): 81 mg via ORAL
  Filled 2020-09-13 (×3): qty 1

## 2020-09-13 MED ORDER — OXYCODONE-ACETAMINOPHEN 5-325 MG PO TABS
1.0000 | ORAL_TABLET | Freq: Four times a day (QID) | ORAL | Status: DC | PRN
  Administered 2020-09-14 – 2020-09-15 (×4): 1 via ORAL
  Filled 2020-09-13 (×4): qty 1

## 2020-09-13 MED ORDER — METOPROLOL SUCCINATE ER 25 MG PO TB24
25.0000 mg | ORAL_TABLET | Freq: Every day | ORAL | Status: DC
  Administered 2020-09-13 – 2020-09-15 (×3): 25 mg via ORAL
  Filled 2020-09-13 (×3): qty 1

## 2020-09-13 MED ORDER — ACETAMINOPHEN 325 MG PO TABS
650.0000 mg | ORAL_TABLET | Freq: Four times a day (QID) | ORAL | Status: DC | PRN
  Administered 2020-09-13 – 2020-09-14 (×3): 650 mg via ORAL
  Filled 2020-09-13 (×3): qty 2

## 2020-09-13 MED ORDER — NICOTINE 21 MG/24HR TD PT24
21.0000 mg | MEDICATED_PATCH | Freq: Every day | TRANSDERMAL | Status: DC
  Filled 2020-09-13 (×3): qty 1

## 2020-09-13 MED ORDER — FLUTICASONE FUROATE-VILANTEROL 100-25 MCG/INH IN AEPB
1.0000 | INHALATION_SPRAY | Freq: Every day | RESPIRATORY_TRACT | Status: DC
  Administered 2020-09-14 – 2020-09-15 (×2): 1 via RESPIRATORY_TRACT
  Filled 2020-09-13: qty 28

## 2020-09-13 MED ORDER — ENOXAPARIN SODIUM 40 MG/0.4ML ~~LOC~~ SOLN
40.0000 mg | SUBCUTANEOUS | Status: DC
  Administered 2020-09-13 – 2020-09-14 (×2): 40 mg via SUBCUTANEOUS
  Filled 2020-09-13 (×2): qty 0.4

## 2020-09-13 MED ORDER — UMECLIDINIUM BROMIDE 62.5 MCG/INH IN AEPB
1.0000 | INHALATION_SPRAY | Freq: Every day | RESPIRATORY_TRACT | Status: DC
  Administered 2020-09-14 – 2020-09-15 (×2): 1 via RESPIRATORY_TRACT
  Filled 2020-09-13: qty 7

## 2020-09-13 MED ORDER — METHYLPREDNISOLONE SODIUM SUCC 40 MG IJ SOLR: 40.0000 mg | Freq: Two times a day (BID) | INTRAMUSCULAR | Status: DC

## 2020-09-13 MED ORDER — AZITHROMYCIN 250 MG PO TABS
250.0000 mg | ORAL_TABLET | Freq: Every day | ORAL | Status: DC
  Administered 2020-09-14 – 2020-09-15 (×2): 250 mg via ORAL
  Filled 2020-09-13 (×2): qty 1

## 2020-09-13 MED ORDER — AZITHROMYCIN 500 MG PO TABS
500.0000 mg | ORAL_TABLET | Freq: Every day | ORAL | Status: AC
  Administered 2020-09-13: 500 mg via ORAL
  Filled 2020-09-13: qty 1

## 2020-09-13 MED ORDER — CITALOPRAM HYDROBROMIDE 20 MG PO TABS
20.0000 mg | ORAL_TABLET | Freq: Every day | ORAL | Status: DC
Start: 2020-09-13 — End: 2020-09-15
  Administered 2020-09-13 – 2020-09-15 (×3): 20 mg via ORAL
  Filled 2020-09-13 (×3): qty 1

## 2020-09-13 MED ORDER — PANTOPRAZOLE SODIUM 40 MG PO TBEC
40.0000 mg | DELAYED_RELEASE_TABLET | Freq: Every day | ORAL | Status: DC
  Administered 2020-09-13 – 2020-09-15 (×3): 40 mg via ORAL
  Filled 2020-09-13 (×3): qty 1

## 2020-09-13 NOTE — ED Notes (Signed)
Dr. Cherylann Banas, MD at bedside for evaluation.

## 2020-09-13 NOTE — H&P (Addendum)
History and Physical    Terri Burns HFW:[id number] DOB: [birth year] DOA: 09/13/2020  Referring MD/NP/PA:   PCP: Steele Sizer, MD   Patient coming from:  The patient is coming from home.  At baseline, pt is independent for most of ADL.        Chief Complaint: SOB  HPI: Terri Burns is a 70 y.o. female with medical history significant of COPD normally not on oxygen, hypertension, hyperlipidemia, GERD, depression, tobacco abuse, squamous cell skin cancer, cervical spine fracture wearing c-collar chronically, who presents with shortness breath.  Her shortness breath started last night, which has been progressively worsening.  Patient has cough with clear mucus production.  Denies chest pain, fever or chills. Patient could not speak in full sentences usually, with oxygen desaturating to 70% on room air.  Initially nonrebreather was started, then changed to 2 L nasal cannula oxygen with oxygen saturation ~90%. No nausea vomiting, diarrhea, abdominal pain, symptoms of UTI.  Patient had headache earlier, which have improved now. Pt states that she was at a wedding yesterday and thinks that the exposure to cold or air which may have triggered her COPD.  Patient received 125 mg of Solu-Medrol by EMS.  Patient had cervical spine fracture 2 years ago, she has been wearing c-collar chronically.  Patient has generalized weakness.  No unilateral numbness or tingling in extremities.  ED Course: pt was found to have WBC 9.6, troponin level 10, BNP 181.8, negative Covid PCR, electrolytes renal function okay, temperature normal, blood pressure 171/109, heart rate 93, 77, RR 29, 21, chest x-ray showed low volumes without infiltration.  Patient is admitted to progressive bed as inpatient.  Review of Systems:   General: no fevers, chills, no body weight gain, has fatigue HEENT: no blurry vision, hearing changes or sore throat.  Wearing c-collar Respiratory: no dyspnea, coughing, wheezing CV: no chest pain,  no palpitations GI: no nausea, vomiting, abdominal pain, diarrhea, constipation GU: no dysuria, burning on urination, increased urinary frequency, hematuria  Ext: no leg edema Neuro: no unilateral weakness, numbness, or tingling, no vision change or hearing loss Skin: no rash, no skin tear. MSK: No muscle spasm, no deformity, no limitation of range of movement in spin Heme: No easy bruising.  Travel history: No recent long distant travel.  Allergy:  Allergies  Allergen Reactions  . Levofloxacin Shortness Of Breath  . Penicillins Hives    itching  . Meloxicam     dizziness  . Nsaids Other (See Comments)    Patient states she can tolerate up to three doses per day without incident    Past Medical History:  Diagnosis Date  . COPD (chronic obstructive pulmonary disease) ([place])   . Depression   . GERD (gastroesophageal reflux disease)   . 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 Hyperlipidemia   . Hypertension   . 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    Past Surgical History:  Procedure Laterality Date  . NECK SURGERY N/A 10/2003  . SHOULDER SURGERY Left 2004    Social History:  reports that she has been smoking cigarettes. She started smoking about 46 years ago. She has a 27.00 pack-year smoking history. She has never used smokeless tobacco. She reports current alcohol use. She reports that she does not use drugs.  Family History:  Family History  Problem Relation Age of Onset  . Hypertension Mother   .  Stroke Mother   . Heart disease Father   . Diabetes Father   . Heart disease Daughter   . Pulmonary embolism Daughter   . Hypertension Son      Prior to Admission medications   Medication Sig Start Date End Date Taking? Authorizing Provider  albuterol (VENTOLIN HFA) 108 (90 Base) MCG/ACT inhaler Inhale 2 puffs into the lungs every 6 (six) hours as needed for wheezing or shortness of  breath. 11/22/17   Poulose, Bethel Born, NP  aspirin 81 MG EC tablet TAKE 1 TABLET BY MOUTH EVERY DAY 04/22/19   Steele Sizer, MD  atorvastatin (LIPITOR) 20 MG tablet Take 1 tablet (20 mg total) by mouth daily. 08/12/20   Steele Sizer, MD  celecoxib (CELEBREX) 200 MG capsule Take 1 capsule (200 mg total) by mouth as needed. 08/12/20   Steele Sizer, MD  citalopram (CELEXA) 20 MG tablet Take 1 tablet (20 mg total) by mouth daily. 08/12/20   Steele Sizer, MD  fluticasone (FLONASE) 50 MCG/ACT nasal spray Place 2 sprays into both nostrils daily. 01/06/20   Steele Sizer, MD  Fluticasone-Umeclidin-Vilant (TRELEGY ELLIPTA) 100-62.5-25 MCG/INH AEPB Take 1 puff by mouth daily. 08/12/20   Steele Sizer, MD  ipratropium-albuterol (DUONEB) 0.5-2.5 (3) MG/3ML SOLN Inhale 3 mLs into the lungs every 4 (four) hours as needed. 08/12/20   Steele Sizer, MD  loratadine (CLARITIN) 10 MG tablet Take 1 tablet (10 mg total) by mouth daily. 08/12/20   Steele Sizer, MD  losartan (COZAAR) 100 MG tablet Take 1 tablet (100 mg total) by mouth daily. 08/12/20   Steele Sizer, MD  metoprolol succinate (TOPROL-XL) 25 MG 24 hr tablet Take 1 tablet (25 mg total) by mouth daily. 08/12/20   Steele Sizer, MD  pantoprazole (PROTONIX) 40 MG tablet Take 1 tablet (40 mg total) by mouth daily. 08/12/20   Steele Sizer, MD  pregabalin (LYRICA) 50 MG capsule Take 1 capsule (50 mg total) by mouth 3 (three) times daily. Start with one qpm and increase dosage by one capsule every 3rd day max of three capsules daily 08/12/20   Steele Sizer, MD    Physical Exam: Vitals:   09/13/20 1030 09/13/20 1100 09/13/20 1130 09/13/20 1200  BP: (!) 163/108 (!) 171/109 (!) 177/108 (!) 182/112  Pulse: 73 77 79 75  Resp: 16 (!) 21 (!) 27 13  Temp:      TempSrc:      SpO2: 97% 90% 90% 99%  Weight:      Height:       General: no acute distress HEENT: Wearing c-collar       Eyes: PERRL, EOMI, no scleral icterus.       ENT: No discharge from the  ears and nose, no pharynx injection, no tonsillar enlargement.        Neck: No JVD, no bruit, no mass felt. Heme: No neck lymph node enlargement. Cardiac: S1/S2, RRR, No murmurs, No gallops or rubs. Respiratory: has wheezing bilaterally GI: Soft, nondistended, nontender, no rebound pain, no organomegaly, BS present. GU: No hematuria Ext: No pitting leg edema bilaterally. 1+DP/PT pulse bilaterally. Musculoskeletal: No joint deformities, No joint redness or warmth, no limitation of ROM in spin. Skin: No rashes.  Neuro: Alert, oriented X3, cranial nerves II-XII grossly intact, moves all extremities normally. Psych: Patient is not psychotic, no suicidal or hemocidal ideation.  Labs on Admission: I have personally reviewed following labs and imaging studies  CBC: Recent Labs  Lab 09/13/20 0728  WBC 9.6  HGB 13.1  HCT 39.9  MCV 88.3  PLT 101   Basic Metabolic Panel: Recent Labs  Lab 09/13/20 0728  NA 136  K 4.8  CL 108  CO2 20*  GLUCOSE 108*  BUN 17  CREATININE 0.83  CALCIUM 8.2*   GFR: Estimated Creatinine Clearance: 52.1 mL/min (by C-G formula based on SCr of 0.83 mg/dL). Liver Function Tests: No results for input(s): AST, ALT, ALKPHOS, BILITOT, PROT, ALBUMIN in the last 168 hours. No results for input(s): LIPASE, AMYLASE in the last 168 hours. No results for input(s): AMMONIA in the last 168 hours. Coagulation Profile: No results for input(s): INR, PROTIME in the last 168 hours. Cardiac Enzymes: No results for input(s): CKTOTAL, CKMB, CKMBINDEX, TROPONINI in the last 168 hours. BNP (last 3 results) No results for input(s): PROBNP in the last 8760 hours. HbA1C: No results for input(s): HGBA1C in the last 72 hours. CBG: No results for input(s): GLUCAP in the last 168 hours. Lipid Profile: No results for input(s): CHOL, HDL, LDLCALC, TRIG, CHOLHDL, LDLDIRECT in the last 72 hours. Thyroid Function Tests: No results for input(s): TSH, T4TOTAL, FREET4, T3FREE,  THYROIDAB in the last 72 hours. Anemia Panel: No results for input(s): VITAMINB12, FOLATE, FERRITIN, TIBC, IRON, RETICCTPCT in the last 72 hours. Urine analysis: No results found for: COLORURINE, APPEARANCEUR, LABSPEC, PHURINE, GLUCOSEU, HGBUR, BILIRUBINUR, KETONESUR, PROTEINUR, UROBILINOGEN, NITRITE, LEUKOCYTESUR Sepsis Labs: @LABRCNTIP (procalcitonin:4,lacticidven:4) ) Recent Results (from the past 240 hour(s))  Resp Panel by RT-PCR (Flu A&B, Covid) Nasopharyngeal Swab     Status: None   Collection Time: 09/13/20  8:13 AM   Specimen: Nasopharyngeal Swab; Nasopharyngeal(NP) swabs in vial transport medium  Result Value Ref Range Status   SARS Coronavirus 2 by RT PCR NEGATIVE NEGATIVE Final    Comment: (NOTE) SARS-CoV-2 target nucleic acids are NOT DETECTED.  The SARS-CoV-2 RNA is generally detectable in upper respiratory specimens during the acute phase of infection. The lowest concentration of SARS-CoV-2 viral copies this assay can detect is 138 copies/mL. A negative result does not preclude SARS-Cov-2 infection and should not be used as the sole basis for treatment or other patient management decisions. A negative result may occur with  improper specimen collection/handling, submission of specimen other than nasopharyngeal swab, presence of viral mutation(s) within the areas targeted by this assay, and inadequate number of viral copies(<138 copies/mL). A negative result must be combined with clinical observations, patient history, and epidemiological information. The expected result is Negative.  Fact Sheet for Patients:  EntrepreneurPulse.com.au  Fact Sheet for Healthcare Providers:  IncredibleEmployment.be  This test is no t yet approved or cleared by the [place] FDA and  has been authorized for detection and/or diagnosis of SARS-CoV-2 by FDA under an Emergency Use Authorization (EUA). This EUA will remain  in effect (meaning this  test can be used) for the duration of the COVID-19 declaration under Section 564(b)(1) of the Act, 21 U.S.C.section 360bbb-3(b)(1), unless the authorization is terminated  or revoked sooner.       Influenza A by PCR NEGATIVE NEGATIVE Final   Influenza B by PCR NEGATIVE NEGATIVE Final    Comment: (NOTE) The Xpert Xpress SARS-CoV-2/FLU/RSV plus assay is intended as an aid in the diagnosis of influenza from Nasopharyngeal swab specimens and should not be used as a sole basis for treatment. Nasal washings and aspirates are unacceptable for Xpert Xpress SARS-CoV-2/FLU/RSV testing.  Fact Sheet for Patients: EntrepreneurPulse.com.au  Fact Sheet for Healthcare Providers: IncredibleEmployment.be  This test is not yet approved or cleared by the [place]  FDA and has been authorized for detection and/or diagnosis of SARS-CoV-2 by FDA under an Emergency Use Authorization (EUA). This EUA will remain in effect (meaning this test can be used) for the duration of the COVID-19 declaration under Section 564(b)(1) of the Act, 21 U.S.C. section 360bbb-3(b)(1), unless the authorization is terminated or revoked.  Performed at [facility], [address], [place], [place] [address]      Radiological Exams on Admission: DG Chest [place] 1 View  Result Date: 09/13/2020 CLINICAL DATA:  70 year old female with history of shortness of breath since yesterday evening, worsening this morning. EXAM: PORTABLE CHEST 1 VIEW COMPARISON:  Chest x-ray 08/20/2018. FINDINGS: Lung volumes are low. Scattered areas of interstitial prominence and architectural distortion are again noted in the lungs, most evident in the left upper lobe and near the left lung base, most compatible with areas of chronic post infectious scarring. No acute consolidative airspace disease. No definite pleural effusions. No pneumothorax. No definite suspicious appearing pulmonary nodules or masses  are noted. No evidence of pulmonary edema. Heart size is normal. Upper mediastinal contours are within normal limits. Aortic atherosclerosis. Orthopedic fixation hardware in the lower cervical spine. IMPRESSION: 1. Low lung volumes without radiographic evidence of acute cardiopulmonary disease. 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 2. Aortic atherosclerosis. Electronically Signed   By: Vinnie Langton M.D.   On: 09/13/2020 08:02     EKG: I have personally reviewed.  Sinus rhythm, QTC 478, low voltage, nonspecific T wave change  Assessment/Plan Principal Problem:   COPD exacerbation (HCC) Active Problems:   GERD (gastroesophageal reflux disease)   Tobacco abuse   Benign essential HTN   Depression   Acute respiratory failure with hypoxia (HCC)   HLD (hyperlipidemia)   Acute respiratory failure with hypoxia due to COPD exacerbation: Patient has productive cough, shortness breath, oxygen desaturation, chest x-ray negative for infiltration.  Has wheezing on auscultation.  Negative Covid PCR.  Clinically consistent with COPD exacerbation.  - will admit to Med-surg unit as inpatient -Bronchodilators -Solu-Medrol 40 mg IV bid -Z pak  -Mucinex for cough  -Incentive spirometry -Follow up sputum culture -Nasal cannula oxygen as needed to maintain O2 saturation 93% or greater  GERD (gastroesophageal reflux disease) -Protonix  Tobacco abuse: -Did counseling about importance of quitting smoking -Nicotine patch  Benign essential HTN: -IV hydralazine. -Cozaar, metoprolol  Depression -Celexa  HLD (hyperlipidemia) -Lipitor         DVT ppx: SQ Lovenox Code Status: Full code Family Communication:   Yes, patient's son by phone Disposition Plan:  Anticipate discharge back to previous environment Consults called:  none Admission status and Level of care: Med-Surg:     as inpt       Status is:  Inpatient  Remains inpatient appropriate because:Inpatient level of care appropriate due to severity of illness   Dispo: The patient is from: Home              Anticipated d/c is to: Home              Patient currently is not medically stable to d/c.   Difficult to place patient No          Date of Service 09/13/2020    [facility]   If 7PM-7AM, please contact night-coverage [email or web address] 09/13/2020, 12:41 PM

## 2020-09-13 NOTE — Plan of Care (Signed)
Oriented to unit, call light, dining services, television, bed alarm. Verbalized understanding.

## 2020-09-13 NOTE — ED Provider Notes (Signed)
[facility] Emergency Department Provider Note ____________________________________________   Event Date/Time   First MD Initiated Contact with Patient 09/13/20 [phone]     (approximate)  I have reviewed the triage vital signs and the nursing notes.   HISTORY  Chief Complaint Shortness of Breath    HPI Terri Burns is a 70 y.o. female with PMH as noted below including COPD who presents with shortness of breath, acute onset around 1 AM today, persistent course since then, and not relieved by her nebulizer treatments at home.  The patient states that she has had a cough productive of clear sputum.  She reports a headache but denies any chest pain, abdominal pain, or other acute pain.  She states that she was at a wedding yesterday evening that was [place] a [place] and thinks that the exposure to cold or air may have triggered her COPD.  She received Solu-Medrol and a DuoNeb by EMS and states that she feels somewhat better.  Past Medical History:  Diagnosis Date  . COPD (chronic obstructive pulmonary disease) ([place])   . Depression   . GERD (gastroesophageal reflux disease)   . 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 Hyperlipidemia   . Hypertension   . 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    Patient Active Problem List   Diagnosis Date Noted  . Depression 09/13/2020  . COPD exacerbation ([place]) 09/13/2020  . Acute respiratory failure with hypoxia ([place]) 09/13/2020  . HLD (hyperlipidemia) 09/13/2020  . Skin lesion of face 05/01/2018  . Atherosclerosis of aorta ([place]) 03/04/2018  . Coronary artery calcification of native artery 03/04/2018  . 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 Chronic neck pain 01/01/2018  . Benign essential HTN 01/01/2018  . Senile purpura ([place]) 01/01/2018  . Recurrent major depressive disorder, in full remission ([place]) 01/01/2018  . Hyperglycemia  09/27/2016  . Tobacco abuse 08/20/2015  . Allergic rhinitis, seasonal 05/12/2015  . Emphysema lung ([place]) 05/12/2015  . Degeneration of intervertebral disc of cervical region 05/12/2015  . GERD (gastroesophageal reflux disease) 05/12/2015  . Major depression in remission ([place]) 05/12/2015    Past Surgical History:  Procedure Laterality Date  . NECK SURGERY N/A 10/2003  . SHOULDER SURGERY Left 2004    Prior to Admission medications   Medication Sig Start Date End Date Taking? Authorizing Provider  albuterol (VENTOLIN HFA) 108 (90 Base) MCG/ACT inhaler Inhale 2 puffs into the lungs every 6 (six) hours as needed for wheezing or shortness of breath. 11/22/17   Poulose, Bethel Born, NP  aspirin 81 MG EC tablet TAKE 1 TABLET BY MOUTH EVERY DAY 04/22/19   Steele Sizer, MD  atorvastatin (LIPITOR) 20 MG tablet Take 1 tablet (20 mg total) by mouth daily. 08/12/20   Steele Sizer, MD  celecoxib (CELEBREX) 200 MG capsule Take 1 capsule (200 mg total) by mouth as needed. 08/12/20   Steele Sizer, MD  citalopram (CELEXA) 20 MG tablet Take 1 tablet (20 mg total) by mouth daily. 08/12/20   Steele Sizer, MD  fluticasone (FLONASE) 50 MCG/ACT nasal spray Place 2 sprays into both nostrils daily. 01/06/20   Steele Sizer, MD  Fluticasone-Umeclidin-Vilant (TRELEGY ELLIPTA) 100-62.5-25 MCG/INH AEPB Take 1 puff by mouth daily. 08/12/20   Steele Sizer, MD  ipratropium-albuterol (DUONEB) 0.5-2.5 (3) MG/3ML SOLN Inhale 3 mLs into the lungs every 4 (four) hours as needed. 08/12/20   Steele Sizer, MD  loratadine (CLARITIN) 10 MG tablet Take 1 tablet (10 mg total) by mouth daily. 08/12/20   Steele Sizer, MD  losartan (COZAAR) 100 MG tablet Take 1 tablet (100 mg total) by mouth daily. 08/12/20   Steele Sizer, MD  metoprolol succinate (TOPROL-XL) 25 MG 24 hr tablet Take 1 tablet (25 mg total) by mouth daily. 08/12/20   Steele Sizer, MD  pantoprazole (PROTONIX) 40 MG tablet Take 1 tablet (40 mg total) by mouth  daily. 08/12/20   Steele Sizer, MD  pregabalin (LYRICA) 50 MG capsule Take 1 capsule (50 mg total) by mouth 3 (three) times daily. Start with one qpm and increase dosage by one capsule every 3rd day max of three capsules daily 08/12/20   Steele Sizer, MD    Allergies Levofloxacin, Penicillins, Meloxicam, and Nsaids  Family History  Problem Relation Age of Onset  . Hypertension Mother   . Stroke Mother   . Heart disease Father   . Diabetes Father   . Heart disease Daughter   . Pulmonary embolism Daughter   . Hypertension Son     Social History Social History   Tobacco Use  . Smoking status: Current Every Day Smoker    Packs/day: 0.60    Years: 45.00    Pack years: 27.00    Types: Cigarettes    Start date: 12/10/1973  . Smokeless tobacco: Never Used  . Tobacco comment: previous pack a day smoker  Vaping Use  . Vaping Use: Never used  Substance Use Topics  . Alcohol use: Yes    Alcohol/week: 0.0 standard drinks    Comment: occasionally  . Drug use: No    Review of Systems  Constitutional: No fever. Eyes: No redness. ENT: No sore throat. Cardiovascular: Denies chest pain. Respiratory: Positive for shortness of breath. Gastrointestinal: No vomiting or diarrhea.  Genitourinary: Negative for flank pain. Musculoskeletal: Negative for back pain. Skin: Negative for rash. Neurological: Positive for headache.   ____________________________________________   PHYSICAL EXAM:  VITAL SIGNS: ED Triage Vitals  Enc Vitals Group     BP 09/13/20 0726 (!) 172/114     Pulse Rate 09/13/20 0726 93     Resp 09/13/20 0726 (!) 29     Temp 09/13/20 0726 98.3 F (36.8 C)     Temp Source 09/13/20 0726 Oral     SpO2 09/13/20 0726 97 %     Weight 09/13/20 0724 130 lb (59 kg)     Height 09/13/20 0724 5' (1.524 m)     Head Circumference --      Peak Flow --      Pain Score 09/13/20 0724 7     Pain Loc --      Pain Edu? --      Excl. in [place]? --     Constitutional: Alert and  oriented.  Somewhat uncomfortable appearing but in no acute distress. Eyes: Conjunctivae are normal.  Head: Atraumatic. Nose: No congestion/rhinnorhea. Mouth/Throat: Mucous membranes are moist.   Neck: Cervical collar in place, chronic. Cardiovascular: Normal rate, regular rhythm. Grossly normal heart sounds.  Good peripheral circulation. Respiratory: Increased respiratory effort.  No retractions.  Wheezing bilaterally. Gastrointestinal: Soft and nontender. No distention.  Genitourinary: No flank tenderness. Musculoskeletal: No lower extremity edema.  Extremities warm and well perfused.  Neurologic:  Normal speech and language. No gross focal neurologic deficits are appreciated.  Skin:  Skin is warm and dry. No rash noted. Psychiatric: Mood and affect are normal. Speech and behavior are normal.  ____________________________________________  LABS (all labs ordered are listed, but only abnormal results are displayed)  Labs Reviewed  BASIC METABOLIC PANEL - Abnormal; Notable for the following components:      Result Value   CO2 20 (*)    Glucose, Bld 108 (*)    Calcium 8.2 (*)    All other components within normal limits  BRAIN NATRIURETIC PEPTIDE - Abnormal; Notable for the following components:   B Natriuretic Peptide 181.8 (*)    All other components within normal limits  RESP PANEL BY RT-PCR (FLU A&B, COVID) ARPGX2  CBC  TROPONIN I (HIGH SENSITIVITY)   ____________________________________________  EKG  ED ECG REPORT I, Arta Silence, the attending physician, personally viewed and interpreted this ECG.  Date: 09/13/2020 EKG Time: 0732 Rate: 94 Rhythm: normal sinus rhythm QRS Axis: normal Intervals: normal ST/T Wave abnormalities: Nonspecific abnormalities Narrative Interpretation: Nonspecific abnormalities with no evidence of acute ischemia  ____________________________________________  RADIOLOGY  Chest x-ray interpreted by me shows no focal consolidation  or edema  ____________________________________________   PROCEDURES  Procedure(s) performed: No  Procedures  Critical Care performed: No ____________________________________________   INITIAL IMPRESSION / ASSESSMENT AND PLAN / ED COURSE  Pertinent labs & imaging results that were available during my care of the patient were reviewed by me and considered in my medical decision making (see chart for details).  70 year old female with PMH as listed above including COPD (not on home O2) presents with acute onset of worsening shortness of breath and cough since early this morning.  EMS noted an O2 saturation in the 70s on their arrival and gave Solu-Medrol and a DuoNeb.  I reviewed the past medical records in Epic; the patient has no recent prior ED visits or admissions.  On exam the patient is uncomfortable appearing but in no acute distress.  She has increased work of breathing but is able to speak in full sentences.  She is hypertensive with otherwise normal vital signs.  O2 saturation is in the high 90s on 2 L by nasal cannula.  There is wheezing bilaterally on lung exam.  There is no peripheral edema.  Differential includes COPD exacerbation, acute bronchitis, pneumonia, COVID-19, or less likely cardiac etiology.  We will obtain a chest x-ray, lab work-up, give additional bronchodilators, and reassess.  ----------------------------------------- 11:48 AM on 09/13/2020 -----------------------------------------  Chest x-ray shows no infiltrates.  Covid is negative.  Lab work-up is reassuring.  The patient appears somewhat more comfortable although when I turned off the oxygen her O2 went down to 89%.  She is now back in the mid 90s on 2 L.  Given the persistent oxygen requirement we will proceed with admission; I consulted Dr. Blaine Hamper from the hospitalist service.  ___________________________  [name] was evaluated in Emergency Department on 09/13/2020 for the symptoms described in  the history of present illness. She was evaluated in the context of the global COVID-19 pandemic, which necessitated consideration that the patient might be at risk for infection with the SARS-CoV-2 virus that causes COVID-19. Institutional protocols and algorithms that pertain to the evaluation of patients at risk for COVID-19 are in a state of rapid change based on information released by regulatory bodies including the CDC and federal and state organizations. These policies and algorithms were followed during the patient's care in the ED.  ____________________________________________   FINAL CLINICAL IMPRESSION(S) / ED DIAGNOSES  Final diagnoses:  COPD exacerbation ([place])  Acute respiratory failure with hypoxia ([facility])      NEW MEDICATIONS STARTED DURING  THIS VISIT:  New Prescriptions   No medications on file     Note:  This document was prepared using Dragon voice recognition software and may include unintentional dictation errors.   Arta Silence, MD 09/13/20 1149

## 2020-09-13 NOTE — ED Triage Notes (Signed)
Pt via EMS from home. Pt c/o SOB since last night and gotten worse this morning. Pt recently went to last night and it was outside and it was chilly and the pt thinks it was what triggered her COPD. Per EMS, pt RA saturation 70%, placed on a NRB and sats went up to 90%. EMS gave 1 albuterol treatment and 1 Duoneb, also 125mg  of Solu-Medrol. Pt states she is also having a headache. Pt is A&Ox4 but still seems SOB, its hard for the pt to complete full sentences.

## 2020-09-13 NOTE — ED Notes (Signed)
Pt RA saturation 90% on RA pt placed on 2L [place], O2 increased to 97% and maintaining.

## 2020-09-13 NOTE — ED Notes (Signed)
Pt given cup of water at this time  ?

## 2020-09-13 NOTE — ED Notes (Signed)
Dr Niu at bedside 

## 2020-09-13 NOTE — ED Notes (Signed)
Pt is complaining of headache and is requesting tylenol, notified MD.

## 2020-09-13 NOTE — ED Notes (Addendum)
Dr. Cherylann Banas MD at bedside for reeval. EDP decreased pt O2 delivery to RA. Will continue to monitor.

## 2020-09-13 NOTE — ED Notes (Signed)
Pt O2 decreased to 90%, pt placed on 2L [place]

## 2020-09-14 DIAGNOSIS — J441 Chronic obstructive pulmonary disease with (acute) exacerbation: Secondary | ICD-10-CM | POA: Diagnosis not present

## 2020-09-14 LAB — BASIC METABOLIC PANEL
Anion gap: 5 (ref 5–15)
BUN: 18 mg/dL (ref 8–23)
CO2: 24 mmol/L (ref 22–32)
Calcium: 8.4 mg/dL — ABNORMAL LOW (ref 8.9–10.3)
Chloride: 108 mmol/L (ref 98–111)
Creatinine, Ser: 0.67 mg/dL (ref 0.44–1.00)
GFR, Estimated: >60 mL/min (ref >60)
Glucose, Bld: 110 mg/dL — ABNORMAL HIGH (ref 70–99)
Potassium: 4.3 mmol/L (ref 3.5–5.1)
Sodium: 137 mmol/L (ref 135–145)

## 2020-09-14 LAB — CBC
HCT: 35.4 % — ABNORMAL LOW (ref 36.0–46.0)
Hemoglobin: 11.3 g/dL — ABNORMAL LOW (ref 12.0–15.0)
MCH: 28.3 pg (ref 26.0–34.0)
MCHC: 31.9 g/dL (ref 30.0–36.0)
MCV: 88.7 fL (ref 80.0–100.0)
Platelets: 211 10*3/uL (ref 150–400)
RBC: 3.99 MIL/uL (ref 3.87–5.11)
RDW: 14.6 % (ref 11.5–15.5)
WBC: 8.8 10*3/uL (ref 4.0–10.5)
nRBC: 0 % (ref 0.0–0.2)

## 2020-09-14 LAB — HIV ANTIBODY (ROUTINE TESTING W REFLEX): HIV Screen 4th Generation wRfx: NONREACTIVE

## 2020-09-14 MED ORDER — ENSURE ENLIVE PO LIQD
237.0000 mL | Freq: Two times a day (BID) | ORAL | Status: DC
  Administered 2020-09-15 (×2): 237 mL via ORAL

## 2020-09-14 MED ORDER — ADULT MULTIVITAMIN W/MINERALS CH
1.0000 | ORAL_TABLET | Freq: Every day | ORAL | Status: DC
  Administered 2020-09-14 – 2020-09-15 (×2): 1 via ORAL
  Filled 2020-09-14 (×2): qty 1

## 2020-09-14 MED ORDER — IPRATROPIUM-ALBUTEROL 0.5-2.5 (3) MG/3ML IN SOLN
3.0000 mL | Freq: Four times a day (QID) | RESPIRATORY_TRACT | Status: DC
  Administered 2020-09-14 – 2020-09-15 (×4): 3 mL via RESPIRATORY_TRACT
  Filled 2020-09-14 (×4): qty 3

## 2020-09-14 MED ORDER — ADULT MULTIVITAMIN W/MINERALS CH: 1.0000 | ORAL_TABLET | Freq: Every day | ORAL | Status: DC

## 2020-09-14 MED ORDER — ALBUTEROL SULFATE (2.5 MG/3ML) 0.083% IN NEBU: 2.5000 mg | INHALATION_SOLUTION | RESPIRATORY_TRACT | Status: DC | PRN

## 2020-09-14 NOTE — Evaluation (Signed)
Occupational Therapy Evaluation Patient Details Name: Terri Burns MRN: [id number] DOB: 03-13-1951 Today's Date: 09/14/2020    History of Present Illness Pt is a 70 y/o F admitted on 09/13/20 with SOB x 3 days. Pt being treated for COPD exacerbation. PMH: COPD, HTN, HLD, GERD, previous smoker, cervical spine fx & instability who wears c-collar, squamous cell skin CA, depression   Clinical Impression   Pt was seen for OT evaluation this date. Prior to hospital admission, pt was modified independent with basic ADL, meal prep, med mgt, and light housekeeping at home. Pt unable to drive since cervical spinal issues and wearing C collar. Pt reports no AD use in her senior living apartment but uses a rollator for community mobility. Pt reports she has a friend who takes her to the grocery store and laundry mat. Pt reports hx decreased R shoulder AROM limiting her ability to lift objects and requiring use of her LUE more in tasks like washing her hair, reaching into cabinets, etc. Pt lives by herself with her small dog and denies falls in past 12 mo. Currently pt demonstrates impairments as described below (See OT problem list) which functionally limit her ability to perform ADL/self-care tasks. Pt on room air throughout session, SpO2 >95% on room air. Pt currently requires increased time/effort/pain with bed mobility but able to perform without assist. Mild dizziness upon sitting EOB but resolves quickly. SBA-CGA for ADL transfers with 2WW, and demos difficulty with LB dressing EOB. Mild SOB and fatigues quickly. Pt instructed in use of AE/DME for ADL and pet care tasks to minimize cervical pain and improve independence/safety. Pt verbalizes understanding. Pt would benefit from skilled OT services to address noted impairments and functional limitations (see below for any additional details) in order to maximize safety and independence while minimizing falls risk and caregiver burden. Upon hospital discharge,  recommend HHOT to maximize pt safety and return to functional independence during meaningful occupations of daily life.     Follow Up Recommendations  Home health OT    Equipment Recommendations  Other (comment) (suction handheld shower head holder to place beside TTB, reacher, sock aide, LH shoe horn, LH sponge)    Recommendations for Other Services       Precautions / Restrictions Precautions Precautions: Fall Precaution Comments: wears cervical collar chronically (~2years) Restrictions Weight Bearing Restrictions: No      Mobility Bed Mobility Overal bed mobility: Needs Assistance Bed Mobility: Supine to Sit;Sit to Supine     Supine to sit: Supervision;HOB elevated Sit to supine: Supervision;HOB elevated   General bed mobility comments: increased difficulty, pain, effort    Transfers Overall transfer level: Needs assistance Equipment used: Rolling walker (2 wheeled) Transfers: Sit to/from Stand Sit to Stand: Supervision;Min guard              Balance Overall balance assessment: Mild deficits observed, not formally tested                                         ADL either performed or assessed with clinical judgement   ADL Overall ADL's : Needs assistance/impaired                                       General ADL Comments: Pt able to don socks EOB using figure 4 technique  with some difficulty and increased pain, CGA for ADL transfers, mod indep for UB ADL tasks     Vision Patient Visual Report: No change from baseline       Perception     Praxis      Pertinent Vitals/Pain Pain Assessment: 0-10 Pain Score: 8  Pain Location: posterior cervical spine with bed mobility/transfers Pain Descriptors / Indicators: Aching;Grimacing Pain Intervention(s): Limited activity within patient's tolerance;Monitored during session;Repositioned;Patient requesting pain meds-RN notified     Hand Dominance Right   Extremity/Trunk  Assessment Upper Extremity Assessment Upper Extremity Assessment: Generalized weakness;RUE deficits/detail RUE Deficits / Details: impaired shoulder flexion AROM (~90*) with pain RUE: Unable to fully assess due to pain   Lower Extremity Assessment Lower Extremity Assessment: Generalized weakness   Cervical / Trunk Assessment Cervical / Trunk Assessment: Other exceptions Cervical / Trunk Exceptions: head forward, C collar   Communication Communication Communication: No difficulties   Cognition Arousal/Alertness: Awake/alert Behavior During Therapy: WFL for tasks assessed/performed Overall Cognitive Status: Within Functional Limits for tasks assessed                                     General Comments       Exercises Other Exercises Other Exercises: Pt instructed in use of AE/DME for ADL and pet care tasks to minimize cervical pain and improve independence/safety   Shoulder Instructions      Home Living Family/patient expects to be discharged to:: Private residence Living Arrangements: Alone Available Help at Discharge: Family;Friend(s);Available PRN/intermittently Type of Home: Apartment Home Access: Level entry     Home Layout: One level     Bathroom Shower/Tub: Teacher, early years/pre: Standard     Home Equipment: Tub bench;Other (comment);Grab bars - toilet;Grab bars - tub/shower;Hand held shower head   Additional Comments: rollator      Prior Functioning/Environment Level of Independence: Needs assistance  Gait / Transfers Assistance Needed: Pt uses rollator for community mobility, no AD in the home ADL's / Homemaking Assistance Needed: Pt reports needing assist from friend to go to the laundry mat and get groceries, but otherwise modified independent with bathing, dressing, med mgt (blister packs mailed to her home), meals, and caring for her dog; hasn't driven since her cervical surgery   Comments: Pt denies falls in past 27mo         OT Problem List: Decreased strength;Decreased range of motion;Pain;Decreased activity tolerance;Impaired balance (sitting and/or standing);Decreased knowledge of use of DME or AE;Impaired UE functional use      OT Treatment/Interventions: Self-care/ADL training;Therapeutic exercise;Therapeutic activities;DME and/or AE instruction;Energy conservation;Patient/family education;Balance training    OT Goals(Current goals can be found in the care plan section) Acute Rehab OT Goals Patient Stated Goal: have less pain and be independent OT Goal Formulation: With patient Time For Goal Achievement: 09/28/20 Potential to Achieve Goals: Good ADL Goals Pt Will Transfer to Toilet: with supervision;ambulating;grab bars;regular height toilet (LRAD for amb, grab bar) Additional ADL Goal #1: Pt will perform morning ADL routine with modified independence with or with AE and reporting <5/10 cervical pain. Additional ADL Goal #2: Pt will verbalize plan to implement at least 1 learned energy conservation strategy to support safety/indep with ADL/IADL.  OT Frequency: Min 1X/week   Barriers to D/C:            Co-evaluation              AM-PAC OT "  6 Clicks" Daily Activity     Outcome Measure Help from another person eating meals?: None Help from another person taking care of personal grooming?: None Help from another person toileting, which includes using toliet, bedpan, or urinal?: A Little Help from another person bathing (including washing, rinsing, drying)?: A Little Help from another person to put on and taking off regular upper body clothing?: None Help from another person to put on and taking off regular lower body clothing?: A Little 6 Click Score: 21   End of Session Nurse Communication: Patient requests pain meds  Activity Tolerance: Patient tolerated treatment well Patient left: in bed;with call bell/phone within reach;with bed alarm set  OT Visit Diagnosis: Other  abnormalities of gait and mobility (R26.89);Muscle weakness (generalized) (M62.81);Pain Pain - Right/Left:  (neck)                Time: 1425-1501 OT Time Calculation (min): 36 min Charges:  OT General Charges $OT Visit: 1 Visit OT Evaluation $OT Eval Moderate Complexity: 1 Mod OT Treatments $Self Care/Home Management : 23-37 mins  Hanley Hays, MPH, MS, OTR/L ascom [phone] 09/14/20, 3:55 PM

## 2020-09-14 NOTE — Progress Notes (Signed)
PROGRESS NOTE    Terri Burns  XFG:[id number] DOB: 05-Jul-1950 DOA: 09/13/2020 PCP: Steele Sizer, MD    Brief Narrative:  70 year old female with history of COPD, hypertension, hyperlipidemia, GERD, previous smoker, cervical spine fracture and instability who wears C-spine chronically presented with shortness of breath for about 3 days.  She also has mucoid sputum production.  In the emergency room hemodynamically stable.  Chest x-ray normal.  Covid and influenza swab negative.  Admitted with COPD exacerbation.   Assessment & Plan:   Principal Problem:   COPD exacerbation ([place]) Active Problems:   GERD (gastroesophageal reflux disease)   Tobacco abuse   Benign essential HTN   Depression   Acute respiratory failure with hypoxia (HCC)   HLD (hyperlipidemia)  COPD with acute exacerbation with hypoxia: Aggressive bronchodilator therapy, IV steroids, inhalational steroids, scheduled and as needed bronchodilators, deep breathing exercises, incentive spirometry, chest physiotherapy and respiratory therapy consult. Antibiotics due to severity of symptoms.  We will treat with 5 days of azithromycin. Supplemental oxygen to keep saturations more than 92%. Start mobilizing with PT OT today.  GERD: On PPI continue.  Smoker: Counseled.  Still smoking.  Nicotine patch.  Essential hypertension: Blood pressure stable on home medications including Cozaar and metoprolol.  Other chronic medical issues including depression/hyperlipidemia: Stable on home medications resumed.   DVT prophylaxis: enoxaparin (LOVENOX) injection 40 mg Start: 09/13/20 2200   Code Status: Full code Family Communication: None none.  Patient is communicating with her son. Disposition Plan: Status is: Inpatient  Remains inpatient appropriate because:IV treatments appropriate due to intensity of illness or inability to take PO and Inpatient level of care appropriate due to severity of illness   Dispo: The patient  is from: Home              Anticipated d/c is to: Home              Patient currently is not medically stable to d/c.   Difficult to place patient No         Consultants:   None.  Procedures:   None  Antimicrobials:   Azithromycin 4/10---   Subjective: Patient seen and examined.  She has some headache along the cervical collar which is mostly chronic.  Denies any other complaints.  She has not mobilized, does complain of some congestion and wheezing at rest.  Currently on room air.  Objective: Vitals:   09/14/20 0618 09/14/20 0751 09/14/20 0830 09/14/20 1107  BP: (!) 151/87  104/75 103/85  Pulse: 74 79 79 74  Resp:  17 20 16   Temp:   98.1 F (36.7 C) 98.2 F (36.8 C)  TempSrc:   Oral Oral  SpO2: 100% 97% 96% 97%  Weight:      Height:        Intake/Output Summary (Last 24 hours) at 09/14/2020 1110 Last data filed at 09/14/2020 0958 Gross per 24 hour  Intake 600 ml  Output 400 ml  Net 200 ml   Filed Weights   09/13/20 0724  Weight: 59 kg    Examination:  General exam: Appears calm and comfortable at rest.  She has not mobilized. She has a Designer, multimedia on place. Respiratory system: Patient mostly has conducted airway sounds, occasional expiratory wheezes. Cardiovascular system: S1 & S2 heard, RRR. No JVD, murmurs, rubs, gallops or clicks. No pedal edema. Gastrointestinal system: Abdomen is nondistended, soft and nontender. No organomegaly or masses felt. Normal bowel sounds heard. Central nervous system: Alert and oriented. No  focal neurological deficits. Extremities: Symmetric 5 x 5 power. Skin: No rashes, lesions or ulcers Psychiatry: Judgement and insight appear normal. Mood & affect appropriate.     Data Reviewed: I have personally reviewed following labs and imaging studies  CBC: Recent Labs  Lab 09/13/20 0728 09/14/20 0413  WBC 9.6 8.8  HGB 13.1 11.3*  HCT 39.9 35.4*  MCV 88.3 88.7  PLT 254 062   Basic Metabolic Panel: Recent Labs   Lab 09/13/20 0728 09/14/20 0413  NA 136 137  K 4.8 4.3  CL 108 108  CO2 20* 24  GLUCOSE 108* 110*  BUN 17 18  CREATININE 0.83 0.67  CALCIUM 8.2* 8.4*   GFR: Estimated Creatinine Clearance: 54.1 mL/min (by C-G formula based on SCr of 0.67 mg/dL). Liver Function Tests: No results for input(s): AST, ALT, ALKPHOS, BILITOT, PROT, ALBUMIN in the last 168 hours. No results for input(s): LIPASE, AMYLASE in the last 168 hours. No results for input(s): AMMONIA in the last 168 hours. Coagulation Profile: No results for input(s): INR, PROTIME in the last 168 hours. Cardiac Enzymes: No results for input(s): CKTOTAL, CKMB, CKMBINDEX, TROPONINI in the last 168 hours. BNP (last 3 results) No results for input(s): PROBNP in the last 8760 hours. HbA1C: No results for input(s): HGBA1C in the last 72 hours. CBG: No results for input(s): GLUCAP in the last 168 hours. Lipid Profile: No results for input(s): CHOL, HDL, LDLCALC, TRIG, CHOLHDL, LDLDIRECT in the last 72 hours. Thyroid Function Tests: No results for input(s): TSH, T4TOTAL, FREET4, T3FREE, THYROIDAB in the last 72 hours. Anemia Panel: No results for input(s): VITAMINB12, FOLATE, FERRITIN, TIBC, IRON, RETICCTPCT in the last 72 hours. Sepsis Labs: No results for input(s): PROCALCITON, LATICACIDVEN in the last 168 hours.  Recent Results (from the past 240 hour(s))  Resp Panel by RT-PCR (Flu A&B, Covid) Nasopharyngeal Swab     Status: None   Collection Time: 09/13/20  8:13 AM   Specimen: Nasopharyngeal Swab; Nasopharyngeal(NP) swabs in vial transport medium  Result Value Ref Range Status   SARS Coronavirus 2 by RT PCR NEGATIVE NEGATIVE Final    Comment: (NOTE) SARS-CoV-2 target nucleic acids are NOT DETECTED.  The SARS-CoV-2 RNA is generally detectable in upper respiratory specimens during the acute phase of infection. The lowest concentration of SARS-CoV-2 viral copies this assay can detect is 138 copies/mL. A negative result  does not preclude SARS-Cov-2 infection and should not be used as the sole basis for treatment or other patient management decisions. A negative result may occur with  improper specimen collection/handling, submission of specimen other than nasopharyngeal swab, presence of viral mutation(s) within the areas targeted by this assay, and inadequate number of viral copies(<138 copies/mL). A negative result must be combined with clinical observations, patient history, and epidemiological information. The expected result is Negative.  Fact Sheet for Patients:  EntrepreneurPulse.com.au  Fact Sheet for Healthcare Providers:  IncredibleEmployment.be  This test is no t yet approved or cleared by the [place] FDA and  has been authorized for detection and/or diagnosis of SARS-CoV-2 by FDA under an Emergency Use Authorization (EUA). This EUA will remain  in effect (meaning this test can be used) for the duration of the COVID-19 declaration under Section 564(b)(1) of the Act, 21 U.S.C.section 360bbb-3(b)(1), unless the authorization is terminated  or revoked sooner.       Influenza A by PCR NEGATIVE NEGATIVE Final   Influenza B by PCR NEGATIVE NEGATIVE Final    Comment: (NOTE) The Xpert Xpress SARS-CoV-2/FLU/RSV  plus assay is intended as an aid in the diagnosis of influenza from Nasopharyngeal swab specimens and should not be used as a sole basis for treatment. Nasal washings and aspirates are unacceptable for Xpert Xpress SARS-CoV-2/FLU/RSV testing.  Fact Sheet for Patients: EntrepreneurPulse.com.au  Fact Sheet for Healthcare Providers: IncredibleEmployment.be  This test is not yet approved or cleared by the [place] FDA and has been authorized for detection and/or diagnosis of SARS-CoV-2 by FDA under an Emergency Use Authorization (EUA). This EUA will remain in effect (meaning this test can be used) for  the duration of the COVID-19 declaration under Section 564(b)(1) of the Act, 21 U.S.C. section 360bbb-3(b)(1), unless the authorization is terminated or revoked.  Performed at [facility], [address], [place], [place] [address]          Radiology Studies: DG Chest [place] 1 View  Result Date: 09/13/2020 CLINICAL DATA:  70 year old female with history of shortness of breath since yesterday evening, worsening this morning. EXAM: PORTABLE CHEST 1 VIEW COMPARISON:  Chest x-ray 08/20/2018. FINDINGS: Lung volumes are low. Scattered areas of interstitial prominence and architectural distortion are again noted in the lungs, most evident in the left upper lobe and near the left lung base, most compatible with areas of chronic post infectious scarring. No acute consolidative airspace disease. No definite pleural effusions. No pneumothorax. No definite suspicious appearing pulmonary nodules or masses are noted. No evidence of pulmonary edema. Heart size is normal. Upper mediastinal contours are within normal limits. Aortic atherosclerosis. Orthopedic fixation hardware in the lower cervical spine. IMPRESSION: 1. Low lung volumes without radiographic evidence of acute cardiopulmonary disease. 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 2. Aortic atherosclerosis. Electronically Signed   By: Vinnie Langton M.D.   On: 09/13/2020 08:02        Scheduled Meds: . aspirin EC  81 mg Oral Daily  . atorvastatin  20 mg Oral QPM  . azithromycin  250 mg Oral Daily  . citalopram  20 mg Oral Daily  . enoxaparin (LOVENOX) injection  40 mg Subcutaneous Q24H  . fluticasone  2 spray Each Nare Daily  . fluticasone furoate-vilanterol  1 puff Inhalation Daily  . ipratropium-albuterol  3 mL Nebulization Q4H  . loratadine  10 mg Oral Daily  . losartan  100 mg Oral Daily  . methylPREDNISolone (SOLU-MEDROL) injection  40 mg Intravenous Q12H  .  metoprolol succinate  25 mg Oral Daily  . nicotine  21 mg Transdermal Daily  . pantoprazole  40 mg Oral Daily  . umeclidinium bromide  1 puff Inhalation Daily   Continuous Infusions:   LOS: 1 day    Time spent: 32 minutes    Barb Merino, MD [facility] Pager [phone]

## 2020-09-14 NOTE — Progress Notes (Signed)
Pt. O&AX4. BP 187/94. Pt. Denies any chest pain, no sign of respiratory distress. Pt. Denies numbness and tingling. PRN IV medication administered to address elevated BP. See Mar. Will recheck BP within the hour. Pt. Also complained of a headache 6/10.see Mar for medication administered. Will continue to  Monitor and reassess pain.

## 2020-09-14 NOTE — Progress Notes (Signed)
Initial Nutrition Assessment  DOCUMENTATION CODES:   Not applicable  INTERVENTION:   Ensure Enlive po BID, each supplement provides 350 kcal and 20 grams of protein  MVI po daily   Liberalize diet   NUTRITION DIAGNOSIS:   Increased nutrient needs related to catabolic illness (COPD) as evidenced by estimated needs.  GOAL:   Patient will meet greater than or equal to 90% of their needs  MONITOR:   PO intake,Supplement acceptance,Labs,Weight trends,Skin,I & O's  REASON FOR ASSESSMENT:   Consult COPD Protocol  ASSESSMENT:   70 y.o. female with medical history significant for COPD, MDD, hypertension, hyperlipidemia, GERD, depression, tobacco abuse, squamous cell skin cancer and cervical spine fracture wearing c-collar chronically who presents with shortness breath.  Met with pt in room today. Pt reports fairly good appetite and oral intake at baseline. Pt reports eating 100% of meals in hospital. RD discussed with pt the importance of adequate protein intake needed to preserve lean muscle. Pt reports she is willing to drink chocolate Ensure in hospital. RD will add supplements and MVI to help pt meet her estimated needs. Per chart, pt is weight stable at baseline.    Medications reviewed and include: aspirin, azithromycin, celexa, lovenox, solu-medrol, nicotine, protonix  Labs reviewed:   NUTRITION - FOCUSED PHYSICAL EXAM:  Flowsheet Row Most Recent Value  Orbital Region No depletion  Upper Arm Region No depletion  Thoracic and Lumbar Region No depletion  Buccal Region No depletion  Temple Region No depletion  Clavicle Bone Region Mild depletion  Clavicle and Acromion Bone Region Mild depletion  Scapular Bone Region No depletion  Dorsal Hand No depletion  Patellar Region Mild depletion  Anterior Thigh Region Mild depletion  Posterior Calf Region Mild depletion  Edema (RD Assessment) None  Hair Reviewed  Eyes Reviewed  Mouth Reviewed  Skin Reviewed  Nails  Reviewed     Diet Order:   Diet Order            Diet Heart Room service appropriate? Yes; Fluid consistency: Thin  Diet effective now                EDUCATION NEEDS:   Education needs have been addressed  Skin:  Skin Assessment: Reviewed RN Assessment  Last BM:  4/9  Height:   Ht Readings from Last 1 Encounters:  09/13/20 5' (1.524 m)    Weight:   Wt Readings from Last 1 Encounters:  09/13/20 59 kg    Ideal Body Weight:  45.45 kg  BMI:  Body mass index is 25.39 kg/m.  Estimated Nutritional Needs:   Kcal:  1400-1600kcal/day  Protein:  70-80g/day  Fluid:  1.4L/day  Koleen Distance MS, RD, LDN Please refer to [facility] for RD and/or RD on-call/weekend/after hours pager

## 2020-09-14 NOTE — Evaluation (Signed)
Physical Therapy Evaluation Patient Details Name: Terri Burns MRN: [id number] DOB: [birth year] Today's Date: 09/14/2020   History of Present Illness  Pt is a 70 y/o F admitted on 09/13/20 with SOB x 3 days. Pt being treated for COPD exacerbation. PMH: COPD, HTN, HLD, GERD, previous smoker, cervical spine fx & instability who wears c-collar, squamous cell skin CA, depression  Clinical Impression  Pt seen for PT evaluation with pt noting significant posterior neck pain but agreeable & eager to participate. Pt is able to complete bed mobility with supervision with hospital bed features, sit<>stand with CGA, and short distance gait in room with RW & min assist before pt notes "my head feels funny", unable to describe but pt states she hasn't had it before, but also feels like it may be due to significant pain. PT instructs pt to sit EOB & vitals checked: HR 73 bpm, SpO2 96% on room air, BP 159/98 mmHg (MAP 116). PT instructs pt on exercises she can perform later at her convenience but pt eager to perform them now & does so with instructional cuing for technique. Pt then reports feeling hot & PT instructs her to lie down & provided pt with cold, wet cloth. Anticipate pt can move well once her pain becomes more managed. PT instructed her on recommendation of supervision at d/c & pt feels her grandson may can assist her - will f/u with them.     Follow Up Recommendations Home health PT;Supervision for mobility/OOB    Equipment Recommendations  None recommended by PT (pt has rollator)    Recommendations for Other Services       Precautions / Restrictions Precautions Precautions: Fall Precaution Comments: wears cervical collar chronically (~2years) Restrictions Weight Bearing Restrictions: No      Mobility  Bed Mobility Overal bed mobility: Needs Assistance Bed Mobility: Supine to Sit;Sit to Supine     Supine to sit: Supervision;HOB elevated Sit to supine: Supervision;HOB elevated    General bed mobility comments: increased difficulty, pain, effort    Transfers Overall transfer level: Needs assistance Equipment used: Rolling walker (2 wheeled) Transfers: Sit to/from Stand Sit to Stand: Supervision         General transfer comment: poor awareness of proper hand placement  Ambulation/Gait Ambulation/Gait assistance: Min guard Gait Distance (Feet): 12 Feet Assistive device: Rolling walker (2 wheeled) Gait Pattern/deviations: Decreased step length - left;Decreased step length - right;Decreased stride length Gait velocity: decreased      Stairs            Wheelchair Mobility    Modified Rankin (Stroke Patients Only)       Balance Overall balance assessment: Needs assistance;Mild deficits observed, not formally tested Sitting-balance support: No upper extremity supported;Feet supported Sitting balance-Leahy Scale: Fair Sitting balance - Comments: supervision sitting EOB   Standing balance support: During functional activity Standing balance-Leahy Scale: Fair                               Pertinent Vitals/Pain Pain Assessment: 0-10 Pain Score: 8  Pain Location: back of head/posterior cervical spine Pain Descriptors / Indicators: Aching;Grimacing Pain Intervention(s): Limited activity within patient's tolerance;Monitored during session;Premedicated before session    Home Living Family/patient expects to be discharged to:: Private residence Living Arrangements: Alone Available Help at Discharge: Family;Friend(s);Available PRN/intermittently Type of Home: Apartment Home Access: Level entry     Home Layout: One level Home Equipment:  (rollator) Additional Comments: rollator  Prior Function Level of Independence: Needs assistance   Gait / Transfers Assistance Needed: Independent without AD in the home, rollator for long distance/community mobility  ADL's / Homemaking Assistance Needed: Pt reports needing assist from  friend to go to the laundry mat and get groceries, but otherwise modified independent with bathing, dressing, med mgt (blister packs mailed to her home), meals, and caring for her dog; hasn't driven since her cervical surgery  Comments: Pt denies falls in past 54mo     Hand Dominance   Dominant Hand: Right    Extremity/Trunk Assessment   Upper Extremity Assessment Upper Extremity Assessment: Generalized weakness RUE Deficits / Details: impaired shoulder flexion AROM (~90*) with pain (per OT report) RUE: Unable to fully assess due to pain    Lower Extremity Assessment Lower Extremity Assessment: Generalized weakness    Cervical / Trunk Assessment Cervical / Trunk Assessment: Other exceptions Cervical / Trunk Exceptions: head forward, C collar  Communication   Communication: No difficulties  Cognition Arousal/Alertness: Awake/alert Behavior During Therapy: WFL for tasks assessed/performed Overall Cognitive Status: Within Functional Limits for tasks assessed                                        General Comments General comments (skin integrity, edema, etc.): SpO2 >90% on room air    Exercises General Exercises - Lower Extremity Long Arc Quad: AROM;Strengthening;Both;10 reps;Seated Hip Flexion/Marching: AROM;Strengthening;Both;10 reps;Seated    Assessment/Plan    PT Assessment Patient needs continued PT services  PT Problem List Decreased strength;Decreased mobility;Decreased activity tolerance;Cardiopulmonary status limiting activity;Pain;Decreased balance;Decreased knowledge of use of DME       PT Treatment Interventions DME instruction;Therapeutic activities;Modalities;Gait training;Therapeutic exercise;Patient/family education;Stair training;Balance training;Functional mobility training;Manual techniques;Neuromuscular re-education    PT Goals (Current goals can be found in the Care Plan section)  Acute Rehab PT Goals Patient Stated Goal: have less  pain and be independent PT Goal Formulation: With patient Time For Goal Achievement: 09/28/20 Potential to Achieve Goals: Fair    Frequency Min 2X/week   Barriers to discharge Decreased caregiver support lives alone    Co-evaluation               AM-PAC PT "6 Clicks" Mobility  Outcome Measure Help needed turning from your back to your side while in a flat bed without using bedrails?: None Help needed moving from lying on your back to sitting on the side of a flat bed without using bedrails?: None Help needed moving to and from a bed to a chair (including a wheelchair)?: A Little Help needed standing up from a chair using your arms (e.g., wheelchair or bedside chair)?: A Little Help needed to walk in hospital room?: A Little Help needed climbing 3-5 steps with a railing? : A Lot 6 Click Score: 19    End of Session Equipment Utilized During Treatment: Gait belt;Cervical collar Activity Tolerance: Patient limited by pain Patient left: in bed;with call bell/phone within reach;with bed alarm set Nurse Communication: Mobility status (c/o pain) PT Visit Diagnosis: Unsteadiness on feet (R26.81);Muscle weakness (generalized) (M62.81);Difficulty in walking, not elsewhere classified (R26.2)    Time: 9983-3825 PT Time Calculation (min) (ACUTE ONLY): 13 min   Charges:   PT Evaluation $PT Eval Low Complexity: Rebecca, PT, DPT 09/14/20, 4:25 PM   Waunita Schooner 09/14/2020, 4:20 PM

## 2020-09-15 ENCOUNTER — Telehealth: Payer: Self-pay

## 2020-09-15 DIAGNOSIS — J441 Chronic obstructive pulmonary disease with (acute) exacerbation: Principal | ICD-10-CM

## 2020-09-15 MED ORDER — DM-GUAIFENESIN ER 30-600 MG PO TB12: 1.0000 | ORAL_TABLET | Freq: Two times a day (BID) | ORAL | 0 refills | Status: AC

## 2020-09-15 MED ORDER — OXYCODONE-ACETAMINOPHEN 5-325 MG PO TABS: 1.0000 | ORAL_TABLET | Freq: Four times a day (QID) | ORAL | 0 refills | Status: AC | PRN

## 2020-09-15 MED ORDER — AZITHROMYCIN 250 MG PO TABS: 250.0000 mg | ORAL_TABLET | Freq: Once | ORAL | 0 refills | Status: AC

## 2020-09-15 MED ORDER — AMLODIPINE BESYLATE 5 MG PO TABS: 5.0000 mg | ORAL_TABLET | Freq: Every day | ORAL | 0 refills | Status: DC

## 2020-09-15 MED ORDER — AMLODIPINE BESYLATE 5 MG PO TABS
5.0000 mg | ORAL_TABLET | Freq: Every day | ORAL | Status: DC
  Administered 2020-09-15: 5 mg via ORAL
  Filled 2020-09-15: qty 1

## 2020-09-15 MED ORDER — NICOTINE 21 MG/24HR TD PT24: 21.0000 mg | MEDICATED_PATCH | Freq: Every day | TRANSDERMAL | 0 refills | Status: AC

## 2020-09-15 MED ORDER — PREDNISONE 10 MG PO TABS: 10.0000 mg | ORAL_TABLET | Freq: Every day | ORAL | 0 refills | Status: DC

## 2020-09-15 NOTE — Discharge Summary (Signed)
Terri Burns:[id number] DOB: [birth year] DOA: 09/13/2020  PCP: Steele Sizer, MD  Admit date: 09/13/2020 Discharge date: 09/15/2020  Admitted From: Home Disposition: Home  Recommendations for Outpatient Follow-up:  1. Follow up with PCP in 1 week 2. Please obtain BMP/CBC in one week 3.   Home Health: Yes   Discharge Condition:Stable CODE STATUS: Full Diet recommendation: Heart Healthy / Carb Modified  Brief/Interim Summary: Per YTK:PTWSF Terri Burns is a 70 y.o. female with medical history significant of COPD normally not on oxygen, hypertension, hyperlipidemia, GERD, depression, tobacco abuse, squamous cell skin cancer, cervical spine fracture wearing c-collar chronically, who presents with shortness breath.  Admitted for COPD exacerbation.  COPD with acute exacerbation with hypoxia: Started on IV steroids and bronchial inhalers  Incentive spirometer  Antibiotics with azithromycin  Doing better.  O2 sat with ambulation in the 90s  Clinically improving and will discharge with steroid taper  Counseled patient extensively against smoking.  Discussed risks and complications of ongoing smoking she verbalizes an understanding.   GERD: Continue PPI    Smoker: Counseled.   Given nicotine patch    Essential hypertension: Mildly elevated  Will continue her Cozaar metoprolol  Add amlodipine 5 mg daily  Follow-follow-up with primary care for further management        Discharge Diagnoses:  Principal Problem:   COPD exacerbation ([place]) Active Problems:   GERD (gastroesophageal reflux disease)   Tobacco abuse   Benign essential HTN   Depression   Acute respiratory failure with hypoxia (HCC)   HLD (hyperlipidemia)    Discharge Instructions   Allergies as of 09/15/2020      Reactions   Levofloxacin Shortness Of Breath   Penicillins Hives   itching   Meloxicam    dizziness   Nsaids Other (See Comments)   Patient states she can tolerate up to three doses per day  without incident      Medication List    STOP taking these medications   celecoxib 200 MG capsule Commonly known as: CeleBREX     TAKE these medications   amLODipine 5 MG tablet Commonly known as: NORVASC Take 1 tablet (5 mg total) by mouth daily. Start taking on: September 16, 2020   aspirin 81 MG EC tablet TAKE 1 TABLET BY MOUTH EVERY DAY   atorvastatin 20 MG tablet Commonly known as: LIPITOR Take 1 tablet (20 mg total) by mouth daily. What changed: when to take this   azithromycin 250 MG tablet Commonly known as: ZITHROMAX Take 1 tablet (250 mg total) by mouth once for 1 dose.   citalopram 20 MG tablet Commonly known as: CELEXA Take 1 tablet (20 mg total) by mouth daily.   dextromethorphan-guaiFENesin 30-600 MG 12hr tablet Commonly known as: MUCINEX DM Take 1 tablet by mouth 2 (two) times daily for 5 days.   fluticasone 50 MCG/ACT nasal spray Commonly known as: FLONASE Place 2 sprays into both nostrils daily.   ipratropium-albuterol 0.5-2.5 (3) MG/3ML Soln Commonly known as: DUONEB Inhale 3 mLs into the lungs every 4 (four) hours as needed.   loratadine 10 MG tablet Commonly known as: CLARITIN Take 1 tablet (10 mg total) by mouth daily.   losartan 100 MG tablet Commonly known as: COZAAR Take 1 tablet (100 mg total) by mouth daily.   metoprolol succinate 25 MG 24 hr tablet Commonly known as: TOPROL-XL Take 1 tablet (25 mg total) by mouth daily.   nicotine 21 mg/24hr patch Commonly known as: NICODERM CQ - dosed in mg/24  hours Place 1 patch (21 mg total) onto the skin daily for 7 days. Start taking on: September 16, 2020   oxyCODONE-acetaminophen 5-325 MG tablet Commonly known as: PERCOCET/ROXICET Take 1 tablet by mouth every 6 (six) hours as needed for up to 2 days for moderate pain.   pantoprazole 40 MG tablet Commonly known as: PROTONIX Take 1 tablet (40 mg total) by mouth daily.   pregabalin 50 MG capsule Commonly known as: Lyrica Take 1 capsule (50  mg total) by mouth 3 (three) times daily. Start with one qpm and increase dosage by one capsule every 3rd day max of three capsules daily   Trelegy Ellipta 100-62.5-25 MCG/INH Aepb Generic drug: Fluticasone-Umeclidin-Vilant Take 1 puff by mouth daily.       Follow-up Information    Steele Sizer, MD Follow up in 1 week(s).   Specialty: Family Medicine Contact information: [address] [place] [address] [phone]              Allergies  Allergen Reactions  . Levofloxacin Shortness Of Breath  . Penicillins Hives    itching  . Meloxicam     dizziness  . Nsaids Other (See Comments)    Patient states she can tolerate up to three doses per day without incident    Consultations:     Procedures/Studies: DG Chest Port 1 View  Result Date: 09/13/2020 CLINICAL DATA:  70 year old female with history of shortness of breath since yesterday evening, worsening this morning. EXAM: PORTABLE CHEST 1 VIEW COMPARISON:  Chest x-ray 08/20/2018. FINDINGS: Lung volumes are low. Scattered areas of interstitial prominence and architectural distortion are again noted in the lungs, most evident in the left upper lobe and near the left lung base, most compatible with areas of chronic post infectious scarring. No acute consolidative airspace disease. No definite pleural effusions. No pneumothorax. No definite suspicious appearing pulmonary nodules or masses are noted. No evidence of pulmonary edema. Heart size is normal. Upper mediastinal contours are within normal limits. Aortic atherosclerosis. Orthopedic fixation hardware in the lower cervical spine. IMPRESSION: 1. Low lung volumes without radiographic evidence of acute cardiopulmonary disease. 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 2. Aortic atherosclerosis. Electronically Signed   By: Vinnie Langton M.D.   On: 09/13/2020 08:02       Subjective: Feels  some shortness of breath with walking otherwise no coughing or wheezing  Discharge Exam: Vitals:   09/15/20 0720 09/15/20 0804  BP:  (!) 169/97  Pulse:  73  Resp:  18  Temp:  (!) 97.5 F (36.4 C)  SpO2: 96% 96%   Vitals:   09/15/20 0415 09/15/20 0550 09/15/20 0720 09/15/20 0804  BP: (!) 183/109 (!) 155/87  (!) 169/97  Pulse: 68 71  73  Resp: 18   18  Temp: (!) 97.5 F (36.4 C)   (!) 97.5 F (36.4 C)  TempSrc: Oral   Oral  SpO2: 97% 96% 96% 96%  Weight:      Height:        General: Pt is alert, awake, not in acute distress Cardiovascular: RRR, S1/S2 +, no rubs, no gallops Respiratory: CTA bilaterally, no wheezing, Abdominal: Soft, NT, ND, bowel sounds + Extremities: no edema,    The results of significant diagnostics from this hospitalization (including imaging, microbiology, ancillary and laboratory) are listed below for reference.     Microbiology: Recent Results (from the past 240 hour(s))  Resp Panel by  RT-PCR (Flu A&B, Covid) Nasopharyngeal Swab     Status: None   Collection Time: 09/13/20  8:13 AM   Specimen: Nasopharyngeal Swab; Nasopharyngeal(NP) swabs in vial transport medium  Result Value Ref Range Status   SARS Coronavirus 2 by RT PCR NEGATIVE NEGATIVE Final    Comment: (NOTE) SARS-CoV-2 target nucleic acids are NOT DETECTED.  The SARS-CoV-2 RNA is generally detectable in upper respiratory specimens during the acute phase of infection. The lowest concentration of SARS-CoV-2 viral copies this assay can detect is 138 copies/mL. A negative result does not preclude SARS-Cov-2 infection and should not be used as the sole basis for treatment or other patient management decisions. A negative result may occur with  improper specimen collection/handling, submission of specimen other than nasopharyngeal swab, presence of viral mutation(s) within the areas targeted by this assay, and inadequate number of viral copies(<138 copies/mL). A negative result must be  combined with clinical observations, patient history, and epidemiological information. The expected result is Negative.  Fact Sheet for Patients:  EntrepreneurPulse.com.au  Fact Sheet for Healthcare Providers:  IncredibleEmployment.be  This test is no t yet approved or cleared by the [place] FDA and  has been authorized for detection and/or diagnosis of SARS-CoV-2 by FDA under an Emergency Use Authorization (EUA). This EUA will remain  in effect (meaning this test can be used) for the duration of the COVID-19 declaration under Section 564(b)(1) of the Act, 21 U.S.C.section 360bbb-3(b)(1), unless the authorization is terminated  or revoked sooner.       Influenza A by PCR NEGATIVE NEGATIVE Final   Influenza B by PCR NEGATIVE NEGATIVE Final    Comment: (NOTE) The Xpert Xpress SARS-CoV-2/FLU/RSV plus assay is intended as an aid in the diagnosis of influenza from Nasopharyngeal swab specimens and should not be used as a sole basis for treatment. Nasal washings and aspirates are unacceptable for Xpert Xpress SARS-CoV-2/FLU/RSV testing.  Fact Sheet for Patients: EntrepreneurPulse.com.au  Fact Sheet for Healthcare Providers: IncredibleEmployment.be  This test is not yet approved or cleared by the [place] FDA and has been authorized for detection and/or diagnosis of SARS-CoV-2 by FDA under an Emergency Use Authorization (EUA). This EUA will remain in effect (meaning this test can be used) for the duration of the COVID-19 declaration under Section 564(b)(1) of the Act, 21 U.S.C. section 360bbb-3(b)(1), unless the authorization is terminated or revoked.  Performed at [facility], [address] [place], [place] [address]      Labs: BNP (last 3 results) Recent Labs    09/13/20 0728  BNP 657.8*   Basic Metabolic Panel: Recent Labs  Lab 09/13/20 0728 09/14/20 0413  NA 136 137   K 4.8 4.3  CL 108 108  CO2 20* 24  GLUCOSE 108* 110*  BUN 17 18  CREATININE 0.83 0.67  CALCIUM 8.2* 8.4*   Liver Function Tests: No results for input(s): AST, ALT, ALKPHOS, BILITOT, PROT, ALBUMIN in the last 168 hours. No results for input(s): LIPASE, AMYLASE in the last 168 hours. No results for input(s): AMMONIA in the last 168 hours. CBC: Recent Labs  Lab 09/13/20 0728 09/14/20 0413  WBC 9.6 8.8  HGB 13.1 11.3*  HCT 39.9 35.4*  MCV 88.3 88.7  PLT 254 211   Cardiac Enzymes: No results for input(s): CKTOTAL, CKMB, CKMBINDEX, TROPONINI in the last 168 hours. BNP: Invalid input(s): POCBNP CBG: No results for input(s): GLUCAP in the last 168 hours. D-Dimer No results for input(s): DDIMER in the last 72 hours. Hgb  A1c No results for input(s): HGBA1C in the last 72 hours. Lipid Profile No results for input(s): CHOL, HDL, LDLCALC, TRIG, CHOLHDL, LDLDIRECT in the last 72 hours. Thyroid function studies No results for input(s): TSH, T4TOTAL, T3FREE, THYROIDAB in the last 72 hours.  Invalid input(s): FREET3 Anemia work up No results for input(s): VITAMINB12, FOLATE, FERRITIN, TIBC, IRON, RETICCTPCT in the last 72 hours. Urinalysis No results found for: COLORURINE, APPEARANCEUR, [place], [place], GLUCOSEU, [place], [place], [place], PROTEINUR, UROBILINOGEN, NITRITE, LEUKOCYTESUR Sepsis Labs Invalid input(s): PROCALCITONIN,  WBC,  LACTICIDVEN Microbiology Recent Results (from the past 240 hour(s))  Resp Panel by RT-PCR (Flu A&B, Covid) Nasopharyngeal Swab     Status: None   Collection Time: 09/13/20  8:13 AM   Specimen: Nasopharyngeal Swab; Nasopharyngeal(NP) swabs in vial transport medium  Result Value Ref Range Status   SARS Coronavirus 2 by RT PCR NEGATIVE NEGATIVE Final    Comment: (NOTE) SARS-CoV-2 target nucleic acids are NOT DETECTED.  The SARS-CoV-2 RNA is generally detectable in upper respiratory specimens during the acute phase of infection. The  lowest concentration of SARS-CoV-2 viral copies this assay can detect is 138 copies/mL. A negative result does not preclude SARS-Cov-2 infection and should not be used as the sole basis for treatment or other patient management decisions. A negative result may occur with  improper specimen collection/handling, submission of specimen other than nasopharyngeal swab, presence of viral mutation(s) within the areas targeted by this assay, and inadequate number of viral copies(<138 copies/mL). A negative result must be combined with clinical observations, patient history, and epidemiological information. The expected result is Negative.  Fact Sheet for Patients:  EntrepreneurPulse.com.au  Fact Sheet for Healthcare Providers:  IncredibleEmployment.be  This test is no t yet approved or cleared by the [place] FDA and  has been authorized for detection and/or diagnosis of SARS-CoV-2 by FDA under an Emergency Use Authorization (EUA). This EUA will remain  in effect (meaning this test can be used) for the duration of the COVID-19 declaration under Section 564(b)(1) of the Act, 21 U.S.C.section 360bbb-3(b)(1), unless the authorization is terminated  or revoked sooner.       Influenza A by PCR NEGATIVE NEGATIVE Final   Influenza B by PCR NEGATIVE NEGATIVE Final    Comment: (NOTE) The Xpert Xpress SARS-CoV-2/FLU/RSV plus assay is intended as an aid in the diagnosis of influenza from Nasopharyngeal swab specimens and should not be used as a sole basis for treatment. Nasal washings and aspirates are unacceptable for Xpert Xpress SARS-CoV-2/FLU/RSV testing.  Fact Sheet for Patients: EntrepreneurPulse.com.au  Fact Sheet for Healthcare Providers: IncredibleEmployment.be  This test is not yet approved or cleared by the [place] FDA and has been authorized for detection and/or diagnosis of SARS-CoV-2 by FDA under  an Emergency Use Authorization (EUA). This EUA will remain in effect (meaning this test can be used) for the duration of the COVID-19 declaration under Section 564(b)(1) of the Act, 21 U.S.C. section 360bbb-3(b)(1), unless the authorization is terminated or revoked.  Performed at [facility], [address] [place], [place] [address]      Time coordinating discharge: Over 30 minutes  SIGNED:   Nolberto Hanlon, MD  [facility] 09/15/2020, 10:46 AM Pager   If 7PM-7AM, please contact night-coverage [email or web address] Password [email or web address]

## 2020-09-15 NOTE — TOC Initial Note (Signed)
Transition of Care [facility]) - Initial/Assessment Note    Patient Details  Name: Terri Burns MRN: [id number] Date of Birth: 12/17/1950  [facility] CM/SW Contact:    Beverly Sessions, RN Phone Number: 09/15/2020, 11:00 AM  Clinical Narrative:                 Patient admitted from home with COPD Patient states that she lives at home alone Son lives locally for support and transportation to appointments Neighbor provides transportation to the grocery store and appointments if needed  PCP [facility] for mail medications, and [facility]  PT and OT have assessed patient and recommends home health. Patient in agreement and states that she does not have a preference of home health agency. Referral made and accepted by Corene Cornea with [facility]  Patient states that she has a rollator and shower seat in the home  Expected Discharge Plan: [place] Barriers to Discharge: No Barriers Identified   Patient Goals and CMS Choice Patient states their goals for this hospitalization and ongoing recovery are:: To get home and [facility] CMS Medicare.gov Compare Post Acute Care list provided to:: Patient Choice offered to / list presented to : Patient  Expected Discharge Plan and Services Expected Discharge Plan: [place] Choice: [place] arrangements for the past 2 months: Apartment Expected Discharge Date: 09/15/20                         HH Arranged: RN,PT,OT [place] Agency: [facility] ([place]) Date [place]: 09/15/20   Representative spoke with at [place]: Corene Cornea  Prior Living Arrangements/Services Living arrangements for the past 2 months: Apartment Lives with:: Self Patient language and need for interpreter reviewed:: Yes        Need for Family Participation in Patient Care: Yes (Comment) Care giver support system in  place?: Yes (comment) Current home services: DME Criminal Activity/Legal Involvement Pertinent to Current Situation/Hospitalization: No - Comment as needed  Activities of Daily Living Home Assistive Devices/Equipment: Environmental consultant (specify type) ADL Screening (condition at time of admission) Patient's cognitive ability adequate to safely complete daily activities?: Yes Is the patient deaf or have difficulty hearing?: No Does the patient have difficulty seeing, even when wearing glasses/contacts?: Yes Does the patient have difficulty concentrating, remembering, or making decisions?: No Patient able to express need for assistance with ADLs?: Yes Does the patient have difficulty dressing or bathing?: No Independently performs ADLs?: Yes (appropriate for developmental age) Does the patient have difficulty walking or climbing stairs?: Yes Weakness of Legs: None Weakness of Arms/Hands: None  Permission Sought/Granted                  Emotional Assessment Appearance:: Appears stated age Attitude/Demeanor/Rapport: Engaged Affect (typically observed): Accepting Orientation: : Oriented to Self,Oriented to Place,Oriented to  Time,Oriented to Situation Alcohol / Substance Use: Not Applicable Psych Involvement: No (comment)  Admission diagnosis:  SOB (shortness of breath) [R06.02] COPD exacerbation (HCC) [J44.1] Acute respiratory failure with hypoxia ([place]) [J96.01] Patient Active Problem List   Diagnosis Date Noted  . Depression 09/13/2020  . COPD exacerbation ([place]) 09/13/2020  . Acute respiratory failure with hypoxia ([place]) 09/13/2020  . HLD (hyperlipidemia) 09/13/2020  . Skin lesion of face 05/01/2018  . Atherosclerosis of aorta ([place]) 03/04/2018  . Coronary artery calcification of native  artery 03/04/2018  . 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 Chronic neck pain 01/01/2018  . Benign essential HTN 01/01/2018  . Senile purpura ([place]) 01/01/2018  . Recurrent major  depressive disorder, in full remission ([place]) 01/01/2018  . Hyperglycemia 09/27/2016  . Tobacco abuse 08/20/2015  . Allergic rhinitis, seasonal 05/12/2015  . Emphysema lung ([place]) 05/12/2015  . Degeneration of intervertebral disc of cervical region 05/12/2015  . GERD (gastroesophageal reflux disease) 05/12/2015  . Major depression in remission ([place]) 05/12/2015   PCP:  Steele Sizer, MD Pharmacy:   [facility] - [place], [place] - [address] [place] [address] Phone: [phone] Fax: [phone]  [id number] [address] [place] [address] Phone: [phone] Fax: [phone]     Social Determinants of Health (SDOH) Interventions    Readmission Risk Interventions No flowsheet data found.

## 2020-09-15 NOTE — Telephone Encounter (Signed)
Please advise as to where to schedule this pt. Copied from [place] [phone]. Topic: Appointment Scheduling - Scheduling Inquiry for Clinic >> Sep 15, 2020 10:40 AM Scherrie Gerlach wrote: Reason for CRM: pt is in the hospital and says she is to be released today.  Pt states she needs to see Dr Ancil Boozer before the end of the week.  Pt states her bp has been running very high while she is in the hospital. Please advise Hospital room phone # [phone] (She says her cell she is using sparingly)

## 2020-09-16 ENCOUNTER — Telehealth: Payer: Self-pay

## 2020-09-16 NOTE — Telephone Encounter (Signed)
Transition Care Management Follow-up Telephone Call  Date of discharge and from where: 09/15/20 [facility]  How have you been since you were released from the hospital? pt states she is feeling weak & concerned about blood pressure  Any questions or concerns? No  Items Reviewed:  Did the pt receive and understand the discharge instructions provided? Yes   Medications obtained and verified? Yes   Other? No   Any new allergies since your discharge? No   Dietary orders reviewed? Yes  Do you have support at home? Yes  - son and grandson nearby  [place] and Equipment/Supplies: Were home health services ordered? yes If so, what is the name of the agency? [facility]  Has the agency set up a time to come to the patient's home? Yes - 4/14 Were any new equipment or medical supplies ordered?  No  Functional Questionnaire: (I = Independent and D = Dependent) ADLs: I  Bathing/Dressing- I  Meal Prep- I  Eating- I  Maintaining continence- I  Transferring/Ambulation- I  Managing Meds- I  Follow up appointments reviewed:   PCP Hospital f/u appt confirmed? Yes  Scheduled to see Dr. Ancil Boozer on 09/24/20 @ 11:20.  Are transportation arrangements needed? No  - pt unsure with son's work schedule but aware to contact office if needed  If their condition worsens, is the pt aware to call PCP or go to the Emergency Dept.? Yes  Was the patient provided with contact information for the PCP's office or ED? Yes  Was to pt encouraged to call back with questions or concerns? Yes

## 2020-09-17 ENCOUNTER — Telehealth: Payer: Self-pay

## 2020-09-17 DIAGNOSIS — Z85828 Personal history of other malignant neoplasm of skin: Secondary | ICD-10-CM | POA: Diagnosis not present

## 2020-09-17 DIAGNOSIS — Z9981 Dependence on supplemental oxygen: Secondary | ICD-10-CM | POA: Diagnosis not present

## 2020-09-17 DIAGNOSIS — F32A Depression, unspecified: Secondary | ICD-10-CM | POA: Diagnosis not present

## 2020-09-17 DIAGNOSIS — Z87891 Personal history of nicotine dependence: Secondary | ICD-10-CM | POA: Diagnosis not present

## 2020-09-17 DIAGNOSIS — Z8781 Personal history of (healed) traumatic fracture: Secondary | ICD-10-CM | POA: Diagnosis not present

## 2020-09-17 DIAGNOSIS — I10 Essential (primary) hypertension: Secondary | ICD-10-CM | POA: Diagnosis not present

## 2020-09-17 DIAGNOSIS — Z9181 History of falling: Secondary | ICD-10-CM | POA: Diagnosis not present

## 2020-09-17 DIAGNOSIS — J9601 Acute respiratory failure with hypoxia: Secondary | ICD-10-CM | POA: Diagnosis not present

## 2020-09-17 DIAGNOSIS — F17210 Nicotine dependence, cigarettes, uncomplicated: Secondary | ICD-10-CM | POA: Diagnosis not present

## 2020-09-17 DIAGNOSIS — Z79891 Long term (current) use of opiate analgesic: Secondary | ICD-10-CM | POA: Diagnosis not present

## 2020-09-17 DIAGNOSIS — Z7982 Long term (current) use of aspirin: Secondary | ICD-10-CM | POA: Diagnosis not present

## 2020-09-17 DIAGNOSIS — J441 Chronic obstructive pulmonary disease with (acute) exacerbation: Secondary | ICD-10-CM | POA: Diagnosis not present

## 2020-09-17 DIAGNOSIS — K219 Gastro-esophageal reflux disease without esophagitis: Secondary | ICD-10-CM | POA: Diagnosis not present

## 2020-09-17 DIAGNOSIS — E785 Hyperlipidemia, unspecified: Secondary | ICD-10-CM | POA: Diagnosis not present

## 2020-09-17 NOTE — Progress Notes (Signed)
Per clinical Pharmacist,schedule patient a follow up appointment at the end of the month.  Patient schedule a telephone appointment on 09/30/2020 at 1:00 pm with the clinical pharmacist.Sent information to scheduler to schedule appointment.  Patient states she is has no energy.Patient reports she slept from 2 pm to 5 pm on 09/16/2020 ,and still feel like she has no energy to do anything.  [place] Pharmacist Assistant [phone]

## 2020-09-21 ENCOUNTER — Telehealth: Payer: Self-pay

## 2020-09-21 NOTE — Telephone Encounter (Signed)
Copied from [place] [phone]. Topic: General - Other >> Sep 18, 2020  1:06 PM Mcneil, Ja-Kwan wrote: Reason for CRM: Pt stated she was in the hospital and sent home with a few hydrocodone pills but she prefers to have Tramadol. Pt request a Rx for Tramadol. Pt would like enough Tramadol until her appt on Thursday 09/24/20.

## 2020-09-21 NOTE — Telephone Encounter (Signed)
Pt.notified

## 2020-09-22 ENCOUNTER — Telehealth: Payer: Self-pay | Admitting: Family Medicine

## 2020-09-22 NOTE — Telephone Encounter (Signed)
Tiffany rn with [facility] is calling to let dr Ancil Boozer know the patient cancelled Skill nursing,PT,OT this week due to death in family

## 2020-09-22 NOTE — Progress Notes (Deleted)
Name: Terri Burns   MRN: [id number]    DOB: 03-07-1951   Date:09/22/2020       Progress Note  Subjective  [facility] Follow Up  HPI  Admitted: 09/13/20 Discharged: 09/15/20  Patient Active Problem List   Diagnosis Date Noted  . Depression 09/13/2020  . COPD exacerbation ([place]) 09/13/2020  . Acute respiratory failure with hypoxia ([place]) 09/13/2020  . HLD (hyperlipidemia) 09/13/2020  . Skin lesion of face 05/01/2018  . Atherosclerosis of aorta ([place]) 03/04/2018  . Coronary artery calcification of native artery 03/04/2018  . 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 Chronic neck pain 01/01/2018  . Benign essential HTN 01/01/2018  . Senile purpura ([place]) 01/01/2018  . Recurrent major depressive disorder, in full remission ([place]) 01/01/2018  . Hyperglycemia 09/27/2016  . Tobacco abuse 08/20/2015  . Allergic rhinitis, seasonal 05/12/2015  . Emphysema lung ([place]) 05/12/2015  . Degeneration of intervertebral disc of cervical region 05/12/2015  . GERD (gastroesophageal reflux disease) 05/12/2015  . Major depression in remission ([place]) 05/12/2015    Past Surgical History:  Procedure Laterality Date  . NECK SURGERY N/A 10/2003  . SHOULDER SURGERY Left 2004    Family History  Problem Relation Age of Onset  . Hypertension Mother   . Stroke Mother   . Heart disease Father   . Diabetes Father   . Heart disease Daughter   . Pulmonary embolism Daughter   . Hypertension Son     Social History   Tobacco Use  . Smoking status: Current Every Day Smoker    Packs/day: 0.60    Years: 45.00    Pack years: 27.00    Types: Cigarettes    Start date: 12/10/1973  . Smokeless tobacco: Never Used  . Tobacco comment: previous pack a day smoker  Substance Use Topics  . Alcohol use: Yes    Alcohol/week: 0.0 standard drinks    Comment: occasionally     Current Outpatient Medications:  .  amLODipine (NORVASC) 5 MG tablet, Take 1 tablet (5 mg total) by mouth daily.,  Disp: 30 tablet, Rfl: 0 .  aspirin 81 MG EC tablet, TAKE 1 TABLET BY MOUTH EVERY DAY, Disp: 90 tablet, Rfl: 0 .  atorvastatin (LIPITOR) 20 MG tablet, Take 1 tablet (20 mg total) by mouth daily., Disp: 90 tablet, Rfl: 1 .  citalopram (CELEXA) 20 MG tablet, Take 1 tablet (20 mg total) by mouth daily., Disp: 90 tablet, Rfl: 1 .  fluticasone (FLONASE) 50 MCG/ACT nasal spray, Place 2 sprays into both nostrils daily., Disp: 48 mL, Rfl: 0 .  Fluticasone-Umeclidin-Vilant (TRELEGY ELLIPTA) 100-62.5-25 MCG/INH AEPB, Take 1 puff by mouth daily., Disp: 60 each, Rfl: 5 .  ipratropium-albuterol (DUONEB) 0.5-2.5 (3) MG/3ML SOLN, Inhale 3 mLs into the lungs every 4 (four) hours as needed., Disp: 120 mL, Rfl: 5 .  loratadine (CLARITIN) 10 MG tablet, Take 1 tablet (10 mg total) by mouth daily., Disp: 90 tablet, Rfl: 1 .  losartan (COZAAR) 100 MG tablet, Take 1 tablet (100 mg total) by mouth daily., Disp: 90 tablet, Rfl: 1 .  metoprolol succinate (TOPROL-XL) 25 MG 24 hr tablet, Take 1 tablet (25 mg total) by mouth daily., Disp: 90 tablet, Rfl: 1 .  nicotine (NICODERM CQ - DOSED IN MG/24 HOURS) 21 mg/24hr patch, Place 1 patch (21 mg total) onto the skin daily for 7 days., Disp: 7 patch, Rfl: 0 .  pantoprazole (PROTONIX) 40 MG tablet, Take 1 tablet (40 mg total) by mouth daily., Disp:  90 tablet, Rfl: 1 .  predniSONE (DELTASONE) 10 MG tablet, Take 1 tablet (10 mg total) by mouth daily for 20 doses., Disp: 20 tablet, Rfl: 0 .  pregabalin (LYRICA) 50 MG capsule, Take 1 capsule (50 mg total) by mouth 3 (three) times daily. Start with one qpm and increase dosage by one capsule every 3rd day max of three capsules daily (Patient not taking: Reported on 09/13/2020), Disp: 90 capsule, Rfl: 0  Allergies  Allergen Reactions  . Levofloxacin Shortness Of Breath  . Penicillins Hives    itching  . Meloxicam     dizziness  . Nsaids Other (See Comments)    Patient states she can tolerate up to three doses per day without incident     I personally reviewed {Reviewed:14835} with the patient/caregiver today.   ROS  ***  Objective  There were no vitals filed for this visit.  There is no height or weight on file to calculate BMI.  Physical Exam ***  Recent Results (from the past 2160 hour(s))  CBC     Status: None   Collection Time: 09/13/20  7:28 AM  Result Value Ref Range   WBC 9.6 4.0 - 10.5 K/uL   RBC 4.52 3.87 - 5.11 MIL/uL   Hemoglobin 13.1 12.0 - 15.0 g/dL   HCT 39.9 36.0 - 46.0 %   MCV 88.3 80.0 - 100.0 fL   MCH 29.0 26.0 - 34.0 pg   MCHC 32.8 30.0 - 36.0 g/dL   RDW 14.9 11.5 - 15.5 %   Platelets 254 150 - 400 K/uL   nRBC 0.0 0.0 - 0.2 %    Comment: Performed at [facility], [address] [place], [place] [address]  Basic metabolic panel     Status: Abnormal   Collection Time: 09/13/20  7:28 AM  Result Value Ref Range   Sodium 136 135 - 145 mmol/L   Potassium 4.8 3.5 - 5.1 mmol/L    Comment: HEMOLYSIS AT THIS LEVEL MAY AFFECT RESULT   Chloride 108 98 - 111 mmol/L   CO2 20 (L) 22 - 32 mmol/L   Glucose, Bld 108 (H) 70 - 99 mg/dL    Comment: Glucose reference range applies only to samples taken after fasting for at least 8 hours.   BUN 17 8 - 23 mg/dL   Creatinine, Ser 0.83 0.44 - 1.00 mg/dL   Calcium 8.2 (L) 8.9 - 10.3 mg/dL   GFR, Estimated >60 >60 mL/min    Comment: (NOTE) Calculated using the CKD-EPI Creatinine Equation (2021)    Anion gap 8 5 - 15    Comment: Performed at [facility], [place], [place] [address]  Troponin I (High Sensitivity)     Status: None   Collection Time: 09/13/20  7:28 AM  Result Value Ref Range   Troponin I (High Sensitivity) 10 <18 ng/L    Comment: (NOTE) Elevated high sensitivity troponin I (hsTnI) values and significant  changes across serial measurements may suggest ACS but many other  chronic and acute conditions are known to elevate hsTnI results.  Refer to the "Links" section for chest pain algorithms and  additional  guidance. Performed at [facility], [address] [place],  [address]   Brain natriuretic peptide     Status: Abnormal   Collection Time: 09/13/20  7:28 AM  Result Value Ref Range   B Natriuretic Peptide 181.8 (H) 0.0 - 100.0 pg/mL    Comment: Performed at [facility],  [place], [place] [address]  Resp Panel by RT-PCR (Flu A&B, Covid) Nasopharyngeal Swab     Status: None   Collection Time: 09/13/20  8:13 AM   Specimen: Nasopharyngeal Swab; Nasopharyngeal(NP) swabs in vial transport medium  Result Value Ref Range   SARS Coronavirus 2 by RT PCR NEGATIVE NEGATIVE    Comment: (NOTE) SARS-CoV-2 target nucleic acids are NOT DETECTED.  The SARS-CoV-2 RNA is generally detectable in upper respiratory specimens during the acute phase of infection. The lowest concentration of SARS-CoV-2 viral copies this assay can detect is 138 copies/mL. A negative result does not preclude SARS-Cov-2 infection and should not be used as the sole basis for treatment or other patient management decisions. A negative result may occur with  improper specimen collection/handling, submission of specimen other than nasopharyngeal swab, presence of viral mutation(s) within the areas targeted by this assay, and inadequate number of viral copies(<138 copies/mL). A negative result must be combined with clinical observations, patient history, and epidemiological information. The expected result is Negative.  Fact Sheet for Patients:  EntrepreneurPulse.com.au  Fact Sheet for Healthcare Providers:  IncredibleEmployment.be  This test is no t yet approved or cleared by the [place] FDA and  has been authorized for detection and/or diagnosis of SARS-CoV-2 by FDA under an Emergency Use Authorization (EUA). This EUA will remain  in effect (meaning this test can be used) for the duration of the COVID-19 declaration under  Section 564(b)(1) of the Act, 21 U.S.C.section 360bbb-3(b)(1), unless the authorization is terminated  or revoked sooner.       Influenza A by PCR NEGATIVE NEGATIVE   Influenza B by PCR NEGATIVE NEGATIVE    Comment: (NOTE) The Xpert Xpress SARS-CoV-2/FLU/RSV plus assay is intended as an aid in the diagnosis of influenza from Nasopharyngeal swab specimens and should not be used as a sole basis for treatment. Nasal washings and aspirates are unacceptable for Xpert Xpress SARS-CoV-2/FLU/RSV testing.  Fact Sheet for Patients: EntrepreneurPulse.com.au  Fact Sheet for Healthcare Providers: IncredibleEmployment.be  This test is not yet approved or cleared by the [place] FDA and has been authorized for detection and/or diagnosis of SARS-CoV-2 by FDA under an Emergency Use Authorization (EUA). This EUA will remain in effect (meaning this test can be used) for the duration of the COVID-19 declaration under Section 564(b)(1) of the Act, 21 U.S.C. section 360bbb-3(b)(1), unless the authorization is terminated or revoked.  Performed at [facility], [address] [place], [place] [address]   HIV Antibody (routine testing w rflx)     Status: None   Collection Time: 09/13/20  4:23 PM  Result Value Ref Range   HIV Screen 4th Generation wRfx Non Reactive Non Reactive    Comment: Performed at [facility], [place] [address] [place], [place] [address]  Basic metabolic panel     Status: Abnormal   Collection Time: 09/14/20  4:13 AM  Result Value Ref Range   Sodium 137 135 - 145 mmol/L   Potassium 4.3 3.5 - 5.1 mmol/L   Chloride 108 98 - 111 mmol/L   CO2 24 22 - 32 mmol/L   Glucose, Bld 110 (H) 70 - 99 mg/dL    Comment: Glucose reference range applies only to samples taken after fasting for at least 8 hours.   BUN 18 8 - 23 mg/dL   Creatinine, Ser 0.67 0.44 - 1.00 mg/dL   Calcium 8.4 (L) 8.9 - 10.3 mg/dL   GFR, Estimated >60 >60  mL/min  Comment: (NOTE) Calculated using the CKD-EPI Creatinine Equation (2021)    Anion gap 5 5 - 15    Comment: Performed at [facility], [address] [place], [place] [address]  CBC     Status: Abnormal   Collection Time: 09/14/20  4:13 AM  Result Value Ref Range   WBC 8.8 4.0 - 10.5 K/uL   RBC 3.99 3.87 - 5.11 MIL/uL   Hemoglobin 11.3 (L) 12.0 - 15.0 g/dL   HCT 35.4 (L) 36.0 - 46.0 %   MCV 88.7 80.0 - 100.0 fL   MCH 28.3 26.0 - 34.0 pg   MCHC 31.9 30.0 - 36.0 g/dL   RDW 14.6 11.5 - 15.5 %   Platelets 211 150 - 400 K/uL   nRBC 0.0 0.0 - 0.2 %    Comment: Performed at [facility], [address], [place], [place] [address]    Diabetic Foot Exam: Diabetic Foot Exam - Simple   No data filed    ***  PHQ2/9: Depression screen [facility] 2/9 08/12/2020 07/09/2020 02/21/2020 09/17/2019 07/09/2019  Decreased Interest 1 0 0 0 0  Down, Depressed, Hopeless 0 0 0 0 0  PHQ - 2 Score 1 0 0 0 0  Altered sleeping 0 - 0 0 -  Tired, decreased energy 1 - 1 0 -  Change in appetite 0 - 0 0 -  Feeling bad or failure about yourself  0 - 0 0 -  Trouble concentrating 0 - 0 0 -  Moving slowly or fidgety/restless 0 - 0 0 -  Suicidal thoughts 0 - 0 0 -  PHQ-9 Score 2 - 1 0 -  Difficult doing work/chores - - Not difficult at all Not difficult at all -  Some recent data might be hidden    phq 9 is {gen pos VUY:[id number]} ***  Fall Risk: Fall Risk  08/12/2020 07/09/2020 02/21/2020 09/17/2019 07/09/2019  Falls in the past year? 0 0 0 0 1  Number falls in past yr: 0 0 0 0 1  Injury with Fall? 0 0 0 0 1  Comment - - - - -  Risk for fall due to : - Impaired balance/gait - - History of fall(s);Impaired vision;Orthopedic patient  Risk for fall due to: Comment - - - - -  Follow up - Falls prevention discussed - Falls evaluation completed Falls prevention discussed   ***   Functional Status Survey:   ***   Assessment & Plan  *** There are no diagnoses linked to this encounter.

## 2020-09-24 ENCOUNTER — Inpatient Hospital Stay: Payer: Medicare Other | Admitting: Family Medicine

## 2020-09-28 ENCOUNTER — Ambulatory Visit: Payer: Medicare Other | Admitting: Dermatology

## 2020-09-29 ENCOUNTER — Telehealth: Payer: Self-pay | Admitting: Family Medicine

## 2020-09-29 ENCOUNTER — Telehealth: Payer: Self-pay

## 2020-09-29 NOTE — Progress Notes (Signed)
Spoke to patient to confirmed patient telephone appointment on 09/30/2020 for CCM at 1:00 pm with [name] the Clinical pharmacist.   Patient Verbalized understanding.  Patient states she is has no energy.Patient reports she slept from 2 pm to 5 pm on 09/16/2020 ,and still feel like she has no energy to do anything.  [place] Pharmacist Assistant [phone]

## 2020-09-29 NOTE — Telephone Encounter (Addendum)
I attempted to contact her twice today with no success. Will attempt again tomorrow.

## 2020-09-29 NOTE — Telephone Encounter (Deleted)
(  with no success)

## 2020-09-29 NOTE — Telephone Encounter (Signed)
Mardene Celeste, from [facility], calling stating that she has an FYI for the pt. She states that she has made multiple attempts to see pt this week and has received no answer. She states that the pt will have a missed visit this week due to that. Please advise.      [phone]

## 2020-09-30 ENCOUNTER — Ambulatory Visit (INDEPENDENT_AMBULATORY_CARE_PROVIDER_SITE_OTHER): Payer: Medicare Other

## 2020-09-30 DIAGNOSIS — J432 Centrilobular emphysema: Secondary | ICD-10-CM

## 2020-09-30 DIAGNOSIS — F32A Depression, unspecified: Secondary | ICD-10-CM | POA: Diagnosis not present

## 2020-09-30 DIAGNOSIS — F330 Major depressive disorder, recurrent, mild: Secondary | ICD-10-CM | POA: Diagnosis not present

## 2020-09-30 DIAGNOSIS — K219 Gastro-esophageal reflux disease without esophagitis: Secondary | ICD-10-CM | POA: Diagnosis not present

## 2020-09-30 DIAGNOSIS — J9601 Acute respiratory failure with hypoxia: Secondary | ICD-10-CM | POA: Diagnosis not present

## 2020-09-30 DIAGNOSIS — E785 Hyperlipidemia, unspecified: Secondary | ICD-10-CM | POA: Diagnosis not present

## 2020-09-30 DIAGNOSIS — I10 Essential (primary) hypertension: Secondary | ICD-10-CM | POA: Diagnosis not present

## 2020-09-30 DIAGNOSIS — J441 Chronic obstructive pulmonary disease with (acute) exacerbation: Secondary | ICD-10-CM | POA: Diagnosis not present

## 2020-09-30 DIAGNOSIS — Z720 Tobacco use: Secondary | ICD-10-CM

## 2020-09-30 NOTE — Progress Notes (Signed)
Chronic Care Management Pharmacy Note  09/30/2020 Name:  Terri Burns MRN:  [id number] DOB:  1950-07-07  Subjective: Terri Burns is an 70 y.o. year old female who is a primary patient of Steele Sizer, MD.  The CCM team was consulted for assistance with disease management and care coordination needs.    Engaged with patient by telephone for follow up visit in response to provider referral for pharmacy case management and/or care coordination services.   Consent to Services:  The patient was given information about Chronic Care Management services, agreed to services, and gave verbal consent prior to initiation of services.  Please see initial visit note for detailed documentation.   Patient Care Team: Steele Sizer, MD as PCP - General (Family Medicine) Nadene Rubins, DO as Referring Physician (Physical Medicine and Rehabilitation)  Recent office visits: 08/12/20: Patient presented to Dr. Ancil Boozer for follow-up. BP 144/92. Losartan increased to 100 mg daily. Patient started on Lyric 50 mg TID.   Recent consult visits: 08/03/20: Patient presented to Dr. Nehemiah Massed (Dermatology)   Hospital visits: Medication Reconciliation was completed by comparing discharge summary, patient's EMR and Pharmacy list, and upon discussion with patient.  Admitted to the hospital on 09/13/2020 due to COPD exacerbation. Discharge date was 09/15/2020. Discharged from [facility]?Medications Started at [facility] Discharge:?? -started Amlodipine 5 mg due to Hypertension elevated -Started Nicotine 21 mg patch Medication Changes at Hospital Discharge: -Changed none  Medications Discontinued at Hospital Discharge: -Stopped Celecoxib 200 mg   Medications that remain the same after Hospital Discharge:??  -All other medications will remain the same.  Objective:  Lab Results  Component Value Date   CREATININE 0.67 09/14/2020   BUN 18 09/14/2020   GFRNONAA >60 09/14/2020    GFRAA 104 02/21/2020   NA 137 09/14/2020   K 4.3 09/14/2020   CALCIUM 8.4 (L) 09/14/2020   CO2 24 09/14/2020   GLUCOSE 110 (H) 09/14/2020    No results found for: HGBA1C, FRUCTOSAMINE, GFR, MICROALBUR  Last diabetic Eye exam: No results found for: HMDIABEYEEXA  Last diabetic Foot exam: No results found for: HMDIABFOOTEX   Lab Results  Component Value Date   CHOL 173 02/21/2020   HDL 101 02/21/2020   LDLCALC 61 02/21/2020   TRIG 39 02/21/2020   CHOLHDL 1.7 02/21/2020    Hepatic Function Latest Ref Rng & Units 02/21/2020 03/13/2019 08/30/2018  Total Protein 6.1 - 8.1 g/dL 6.9 6.8 5.9(L)  Albumin 3.6 - 5.1 g/dL - - -  AST 10 - 35 U/L 22 28 33  ALT 6 - 29 U/L 19 24 30(H)  Alk Phosphatase 33 - 130 U/L - - -  Total Bilirubin 0.2 - 1.2 mg/dL 0.5 0.4 0.3    No results found for: TSH, FREET4  CBC Latest Ref Rng & Units 09/14/2020 09/13/2020 02/21/2020  WBC 4.0 - 10.5 K/uL 8.8 9.6 8.0  Hemoglobin 12.0 - 15.0 g/dL 11.3(L) 13.1 12.6  Hematocrit 36.0 - 46.0 % 35.4(L) 39.9 39.3  Platelets 150 - 400 K/uL 211 254 254    No results found for: VD25OH  Clinical ASCVD: Yes  The ASCVD Risk score Mikey Bussing DC Jr., et al., 2013) failed to calculate for the following reasons:   The valid HDL cholesterol range is 20 to 100 mg/dL    Depression screen [facility] 2/9 08/12/2020 07/09/2020 02/21/2020  Decreased Interest 1 0 0  Down, Depressed, Hopeless 0 0 0  PHQ - 2 Score 1 0 0  Altered sleeping 0 - 0  Tired, decreased energy 1 - 1  Change in appetite 0 - 0  Feeling bad or failure about yourself  0 - 0  Trouble concentrating 0 - 0  Moving slowly or fidgety/restless 0 - 0  Suicidal thoughts 0 - 0  PHQ-9 Score 2 - 1  Difficult doing work/chores - - Not difficult at all  Some recent data might be hidden    Social History   Tobacco Use  Smoking Status Current Every Day Smoker  . Packs/day: 0.25  . Years: 45.00  . Pack years: 11.25  . Types: Cigarettes  . Start date: 12/10/1973  Smokeless Tobacco  Never Used  Tobacco Comment   4 cigarettes daily    BP Readings from Last 3 Encounters:  09/15/20 (!) 144/93  08/12/20 (!) 144/92  02/21/20 (!) 200/112   Pulse Readings from Last 3 Encounters:  09/15/20 87  08/12/20 78  02/21/20 76   Wt Readings from Last 3 Encounters:  09/13/20 130 lb (59 kg)  08/12/20 132 lb (59.9 kg)  02/21/20 125 lb 3.2 oz (56.8 kg)   BMI Readings from Last 3 Encounters:  09/13/20 25.39 kg/m  08/12/20 25.78 kg/m  02/21/20 24.45 kg/m    Assessment/Interventions: Review of patient past medical history, allergies, medications, health status, including review of consultants reports, laboratory and other test data, was performed as part of comprehensive evaluation and provision of chronic care management services.   SDOH:  (Social Determinants of Health) assessments and interventions performed: Yes SDOH Interventions   Flowsheet Row Most Recent Value  SDOH Interventions   Financial Strain Interventions Intervention Not Indicated     SDOH Screenings   Alcohol Screen: Not on file  Depression (PHQ2-9): Low Risk   . PHQ-2 Score: 2  Financial Resource Strain: Low Risk   . Difficulty of Paying Living Expenses: Not hard at all  Food Insecurity: No Food Insecurity  . Worried About Charity fundraiser in the Last Year: Never true  . Ran Out of Food in the Last Year: Never true  Housing: Low Risk   . Last Housing Risk Score: 0  Physical Activity: Inactive  . Days of Exercise per Week: 0 days  . Minutes of Exercise per Session: 0 min  Social Connections: Moderately Integrated  . Frequency of Communication with Friends and Family: More than three times a week  . Frequency of Social Gatherings with Friends and Family: More than three times a week  . Attends Religious Services: More than 4 times per year  . Active Member of Clubs or Organizations: Yes  . Attends Archivist Meetings: More than 4 times per year  . Marital Status: Widowed  Stress:  No Stress Concern Present  . Feeling of Stress : Not at all  Tobacco Use: High Risk  . Smoking Tobacco Use: Current Every Day Smoker  . Smokeless Tobacco Use: Never Used  Transportation Needs: No Transportation Needs  . Lack of Transportation (Medical): No  . Lack of Transportation (Non-Medical): No    CCM Care Plan  Allergies  Allergen Reactions  . Levofloxacin Shortness Of Breath  . Penicillins Hives    itching  . Meloxicam     dizziness  . Nsaids Other (See Comments)    Patient states she can tolerate up to three doses per day without incident    Medications Reviewed Today    Reviewed by Germaine Pomfret, [facility] (Pharmacist) on 09/17/20 at 1002  Med List Status: <  None>  Medication Order Taking? Sig Documenting Provider Last Dose Status Informant  amLODipine (NORVASC) 5 MG tablet [id number] Yes Take 1 tablet (5 mg total) by mouth daily. Nolberto Hanlon, MD Taking Active   aspirin 81 MG EC tablet [id number] Yes TAKE 1 TABLET BY MOUTH EVERY DAY Steele Sizer, MD Taking Active Self  atorvastatin (LIPITOR) 20 MG tablet [id number] Yes Take 1 tablet (20 mg total) by mouth daily. Steele Sizer, MD Taking Active Self  citalopram (CELEXA) 20 MG tablet [id number] Yes Take 1 tablet (20 mg total) by mouth daily. Steele Sizer, MD Taking Active Self  dextromethorphan-guaiFENesin [facility] DM) 30-600 MG 12hr tablet [id number]  Take 1 tablet by mouth 2 (two) times daily for 5 days. Nolberto Hanlon, MD  Active   fluticasone [facility]) 50 MCG/ACT nasal spray [id number]  Place 2 sprays into both nostrils daily. Steele Sizer, MD  Active Self  Fluticasone-Umeclidin-Vilant (TRELEGY ELLIPTA) 100-62.5-25 MCG/INH AEPB [id number] Yes Take 1 puff by mouth daily. Steele Sizer, MD Taking Active Self  ipratropium-albuterol (DUONEB) 0.5-2.5 (3) MG/3ML SOLN [id number] Yes Inhale 3 mLs into the lungs every 4 (four) hours as needed. Steele Sizer, MD Taking Active Self  loratadine (CLARITIN) 10 MG tablet [id number]  Yes Take 1 tablet (10 mg total) by mouth daily. Steele Sizer, MD Taking Active Self  losartan (COZAAR) 100 MG tablet [id number] Yes Take 1 tablet (100 mg total) by mouth daily. Steele Sizer, MD Taking Active Self  metoprolol succinate (TOPROL-XL) 25 MG 24 hr tablet [id number] Yes Take 1 tablet (25 mg total) by mouth daily. Steele Sizer, MD Taking Active Self  nicotine (NICODERM CQ - DOSED IN MG/24 HOURS) 21 mg/24hr patch [id number] Yes Place 1 patch (21 mg total) onto the skin daily for 7 days. Nolberto Hanlon, MD Taking Active   oxyCODONE-acetaminophen (PERCOCET/ROXICET) 5-325 MG tablet [id number]  Take 1 tablet by mouth every 6 (six) hours as needed for up to 2 days for moderate pain. Nolberto Hanlon, MD  Active   pantoprazole (PROTONIX) 40 MG tablet [id number] Yes Take 1 tablet (40 mg total) by mouth daily. Steele Sizer, MD Taking Active Self  predniSONE (DELTASONE) 10 MG tablet [id number]  Take 1 tablet (10 mg total) by mouth daily for 20 doses. Nolberto Hanlon, MD  Active   pregabalin (LYRICA) 50 MG capsule [id number]  Take 1 capsule (50 mg total) by mouth 3 (three) times daily. Start with one qpm and increase dosage by one capsule every 3rd day max of three capsules daily  Patient not taking: Reported on 09/13/2020   Steele Sizer, MD  Active Self          Patient Active Problem List   Diagnosis Date Noted  . Depression 09/13/2020  . COPD exacerbation ([place]) 09/13/2020  . Acute respiratory failure with hypoxia ([place]) 09/13/2020  . HLD (hyperlipidemia) 09/13/2020  . Skin lesion of face 05/01/2018  . Atherosclerosis of aorta ([place]) 03/04/2018  . Coronary artery calcification of native artery 03/04/2018  . 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 Chronic neck pain 01/01/2018  . Benign essential HTN 01/01/2018  . Senile purpura ([place]) 01/01/2018  . Recurrent major depressive disorder, in full remission ([place]) 01/01/2018  . Hyperglycemia 09/27/2016  . Tobacco abuse 08/20/2015  .  Allergic rhinitis, seasonal 05/12/2015  . Emphysema lung ([place]) 05/12/2015  . Degeneration of intervertebral disc of cervical region 05/12/2015  . GERD (gastroesophageal reflux disease) 05/12/2015  . Major depression in remission ([place]) 05/12/2015    Immunization History  Administered Date(s) Administered  .  Fluad Quad(high Dose 65+) 03/13/2019, 02/21/2020  . Influenza, High Dose Seasonal PF 03/29/2017  . Influenza, Seasonal, Injecte, Preservative Fre 03/26/2012  . Influenza,inj,Quad PF,6+ Mos 03/18/2013, 05/12/2015, 05/09/2016  . Moderna Sars-Covid-2 Vaccination 10/17/2019, 11/14/2019  . Pneumococcal Conjugate-13 08/30/2018  . Pneumococcal Polysaccharide-23 03/08/2010, 06/09/2013, 02/21/2020  . Tdap 05/27/2013    Conditions to be addressed/monitored:  Hypertension, Hyperlipidemia, Coronary Artery Disease, GERD, COPD, Depression and Tobacco use  Care Plan : General Pharmacy (Adult)  Updates made by Germaine Pomfret, RPH since 09/30/2020 12:00 AM    Problem: Hypertension, Hyperlipidemia, Coronary Artery Disease, GERD, COPD, Depression and Tobacco use   Priority: High    Long-Range Goal: Patient-Specific Goal   Start Date: 09/30/2020  Expected End Date: 04/01/2021  This Visit's Progress: On track  Priority: High  Note:   Current Barriers:  Unable to achieve control of COPD  Unable to maintain control of Blood pressure  Pharmacist Clinical Goal(s):  Patient will achieve control of blood pressure as evidenced by BP less than 140/90 achieve control of COPD as evidenced by stable breathing, lack of exacerbations through collaboration with PharmD and provider.   Interventions: 1:1 collaboration with Steele Sizer, MD regarding development and update of comprehensive plan of care as evidenced by provider attestation and co-signature Inter-disciplinary care team collaboration (see longitudinal plan of care) Comprehensive medication review performed; medication list updated in  electronic medical record  Hypertension (BP goal <140/90) -Not ideally controlled -Current treatment: Amlodipine 5 mg daily  Losartan 100 mg daily  Metoprolol XL 25 mg daily  -Medications previously tried: NA  -Current home readings: 140/90 (last week). Needs a new blood pressure machine.  -Denies hypotensive/hypertensive symptoms -Educated on Daily salt intake goal < 2300 mg; Importance of home blood pressure monitoring; -Counseled to monitor BP at home 2-3 times weekly, document, and provide log at future appointments -Recommended to continue current medication  Hyperlipidemia: (LDL goal < 70) -Controlled -Current treatment: Atorvastatin 20 mg daily   -Current antiplatelet treatment: Aspirin 81 mg daily -Medications previously tried: NA  -Educated on Importance of limiting foods high in cholesterol; -Recommended to continue current medication  COPD (Goal: control symptoms and prevent exacerbations) -Uncontrolled -Current treatment  Albuterol 1-2 puffs every 6 hours as needed Trelegy 1 puff daily  Duoneb -Medications previously tried: NA  -Gold Grade: NA -Current COPD Classification:  D (high sx, >/=2 exacerbations/yr) -CAT ASSESSMENT  Rank each of the following items on a scale of 0 to 5 (with 5 being most severe) Write a # 0-5 in each box I never cough (0) > I cough all the time (5) 3 I have no phlegm (mucus) in my chest (0) > My chest is completely full of phlegm (mucus) (5) 3 My chest does not feel tight at all (0) > My chest feels very tight (5) 0 When I walk up a hill or one flight of stairs I am not breathless (0) > When I walk up a hill or one flight of stairs I am very breathless (5) 2 I am not limited doing any activities at home (0) > I am very limited doing activities at home (5) 2 I am confident leaving my home despite my lung function (0) > I am not at all confident leaving my home because of my lung condition (5)  0 I sleep soundly (0) > I don't sleep  soundly because of my lung condition (5) 0 I have lots of energy (0) > I have no energy at all (5) 1  Total CAT Score: 11 -Pulmonary function testing: NA -Exacerbations requiring treatment in last 6 months: Yes, requiring IV + PO steroids.  -Patient reports consistent use of maintenance inhaler -Frequency of rescue inhaler use: None. Uses nebulizer 1-2 times daily  -Counseled on Proper inhaler technique; When to use rescue inhaler -Recommended to continue current medication  -Recommend referral to pulmonology for spirometry testing.  Tobacco use (Goal Quit smoking) -Controlled  -45 year pack history  -Previous quit attempts: NA -Current treatment  Nicotine Patch 21 mg daily  Currently smoking 4 cigs / daily  -Patient smokes After 30 minutes of waking  -Patient triggers include: stress and drinking coffee  -Patient set quit date of 10/04/20. Counseled on patch placement, side effects, and option to remove at night if they experience trouble sleeping or bad dreams.  -Recommended Continuing Nicotine Patch 21 mg for 6 weeks, then decreasing dose.  -Recommend Nicotine Gum 2 mg as needed for breakthrough cravings  Depression/Anxiety (Goal: Maintain stable mood) -Controlled -Current treatment: Citalopram 20 mg daily  -Medications previously tried/failed: NA -PHQ9: 2 -GAD7: 0 -Connected with NA for mental health support -Educated on Benefits of cognitive-behavioral therapy with or without medication -Recommended to continue current medication  Patient Goals/Self-Care Activities Patient will:  - check blood pressure 2-3 times weekly, document, and provide at future appointments continue decreasing cigarette use  Follow Up Plan: Telephone follow up appointment with care management team member scheduled for:  12/24/20 at 1:00 PM      Medication Assistance: None required.  Patient affirms current coverage meets needs.  Patient's preferred pharmacy is:  [facility] -  [place], [place] - [address] [place] [address] Phone: [phone] Fax: [phone]  [id number] [id number]: [phone]: [phone] Patient decided to: Utilize [facility] for medication synchronization, packaging and delivery  Care Plan and Follow Up Patient Decision:  Patient agrees to Care Plan and Follow-up.  Plan: Telephone follow up appointment with care management team member scheduled for:  12/24/20 at 1:00 PM  ADoristine Section [phone]

## 2020-09-30 NOTE — Patient Instructions (Signed)
Visit Information It was great speaking with you today!  Please let me know if you have any questions about our visit.  Goals Addressed            This Visit's Progress   . Quit smoking / using tobacco   On track    Recommend to attend smoking cessation classes with [facility] to discuss smoking cessation    . Track and Manage My Triggers-COPD       Timeframe:  Long-Range Goal Priority:  High Start Date:  09/30/2020                           Expected End Date:  04/01/2021                     Follow Up Date 11/02/2020   - avoid second hand smoke - eliminate smoking in my home - identify and remove indoor air pollutants - limit outdoor activity during cold weather    Why is this important?    Triggers are activities or things, like tobacco smoke or cold weather, that make your COPD (chronic obstructive pulmonary disease) flare-up.   Knowing these triggers helps you plan how to stay away from them.   When you cannot remove them, you can learn how to manage them.     Notes:        Patient Care Plan: General Pharmacy (Adult)    Problem Identified: Hypertension, Hyperlipidemia, Coronary Artery Disease, GERD, COPD, Depression and Tobacco use   Priority: High    Long-Range Goal: Patient-Specific Goal   Start Date: 09/30/2020  Expected End Date: 04/01/2021  This Visit's Progress: On track  Priority: High  Note:   Current Barriers:  Unable to achieve control of COPD  Unable to maintain control of Blood pressure  Pharmacist Clinical Goal(s):  Patient will achieve control of blood pressure as evidenced by BP less than 140/90 achieve control of COPD as evidenced by stable breathing, lack of exacerbations through collaboration with PharmD and provider.   Interventions: 1:1 collaboration with Steele Sizer, MD regarding development and update of comprehensive plan of care as evidenced by provider attestation and co-signature Inter-disciplinary care team collaboration (see  longitudinal plan of care) Comprehensive medication review performed; medication list updated in electronic medical record  Hypertension (BP goal <140/90) -Not ideally controlled -Current treatment: Amlodipine 5 mg daily  Losartan 100 mg daily  Metoprolol XL 25 mg daily  -Medications previously tried: NA  -Current home readings: 140/90 (last week). Needs a new blood pressure machine.  -Denies hypotensive/hypertensive symptoms -Educated on Daily salt intake goal < 2300 mg; Importance of home blood pressure monitoring; -Counseled to monitor BP at home 2-3 times weekly, document, and provide log at future appointments -Recommended to continue current medication  Hyperlipidemia: (LDL goal < 70) -Controlled -Current treatment: Atorvastatin 20 mg daily   -Current antiplatelet treatment: Aspirin 81 mg daily -Medications previously tried: NA  -Educated on Importance of limiting foods high in cholesterol; -Recommended to continue current medication  COPD (Goal: control symptoms and prevent exacerbations) -Uncontrolled -Current treatment  Albuterol 1-2 puffs every 6 hours as needed Trelegy 1 puff daily  Duoneb -Medications previously tried: NA  -Gold Grade: NA -Current COPD Classification:  D (high sx, >/=2 exacerbations/yr) -CAT ASSESSMENT  Rank each of the following items on a scale of 0 to 5 (with 5 being most severe) Write a # 0-5 in each box I never cough (0) >  I cough all the time (5) 3 I have no phlegm (mucus) in my chest (0) > My chest is completely full of phlegm (mucus) (5) 3 My chest does not feel tight at all (0) > My chest feels very tight (5) 0 When I walk up a hill or one flight of stairs I am not breathless (0) > When I walk up a hill or one flight of stairs I am very breathless (5) 2 I am not limited doing any activities at home (0) > I am very limited doing activities at home (5) 2 I am confident leaving my home despite my lung function (0) > I am not at all  confident leaving my home because of my lung condition (5)  0 I sleep soundly (0) > I don't sleep soundly because of my lung condition (5) 0 I have lots of energy (0) > I have no energy at all (5) 1  Total CAT Score: 11 -Pulmonary function testing: NA -Exacerbations requiring treatment in last 6 months: Yes, requiring IV + PO steroids.  -Patient reports consistent use of maintenance inhaler -Frequency of rescue inhaler use: None. Uses nebulizer 1-2 times daily  -Counseled on Proper inhaler technique; When to use rescue inhaler -Recommended to continue current medication  -Recommend referral to pulmonology for spirometry testing.  Tobacco use (Goal Quit smoking) -Controlled  -45 year pack history  -Previous quit attempts: NA -Current treatment  Nicotine Patch 21 mg daily  Currently smoking 4 cigs / daily  -Patient smokes After 30 minutes of waking  -Patient triggers include: stress and drinking coffee  -Patient set quit date of 10/04/20. Counseled on patch placement, side effects, and option to remove at night if they experience trouble sleeping or bad dreams.  -Recommended Continuing Nicotine Patch 21 mg for 6 weeks, then decreasing dose.  -Recommend Nicotine Gum 2 mg as needed for breakthrough cravings  Depression/Anxiety (Goal: Maintain stable mood) -Controlled -Current treatment: Citalopram 20 mg daily  -Medications previously tried/failed: NA -PHQ9: 2 -GAD7: 0 -Connected with NA for mental health support -Educated on Benefits of cognitive-behavioral therapy with or without medication -Recommended to continue current medication  Patient Goals/Self-Care Activities Patient will:  - check blood pressure 2-3 times weekly, document, and provide at future appointments continue decreasing cigarette use  Follow Up Plan: Telephone follow up appointment with care management team member scheduled for:  12/24/20 at 1:00 PM      Patient agreed to services and verbal consent  obtained.   The patient verbalized understanding of instructions, educational materials, and care plan provided today and declined offer to receive copy of patient instructions, educational materials, and care plan.   Doristine Section, [facility] [phone]

## 2020-10-02 ENCOUNTER — Telehealth: Payer: Self-pay | Admitting: Family Medicine

## 2020-10-02 DIAGNOSIS — J9601 Acute respiratory failure with hypoxia: Secondary | ICD-10-CM | POA: Diagnosis not present

## 2020-10-02 DIAGNOSIS — I10 Essential (primary) hypertension: Secondary | ICD-10-CM | POA: Diagnosis not present

## 2020-10-02 DIAGNOSIS — E785 Hyperlipidemia, unspecified: Secondary | ICD-10-CM | POA: Diagnosis not present

## 2020-10-02 DIAGNOSIS — J441 Chronic obstructive pulmonary disease with (acute) exacerbation: Secondary | ICD-10-CM | POA: Diagnosis not present

## 2020-10-02 DIAGNOSIS — K219 Gastro-esophageal reflux disease without esophagitis: Secondary | ICD-10-CM | POA: Diagnosis not present

## 2020-10-02 DIAGNOSIS — F32A Depression, unspecified: Secondary | ICD-10-CM | POA: Diagnosis not present

## 2020-10-02 NOTE — Telephone Encounter (Signed)
Home Health Verbal Orders - Caller/Agency: [facility]  Callback [phone]  Requesting OT/PT/Skilled Nursing/Social Work/Speech Therapy: PT verbal  Frequency:  1 x week for 5 weeks

## 2020-10-02 NOTE — Telephone Encounter (Signed)
Home Health Verbal Orders - Caller/Agency: [facility] Number: [phone]  Requesting OT/PT/Skilled Nursing/Social Work/Speech Therapy: OT  Frequency: 1 week 1 and 2 week 3

## 2020-10-07 ENCOUNTER — Telehealth: Payer: Self-pay

## 2020-10-07 NOTE — Progress Notes (Signed)
    Chronic Care Management Pharmacy Assistant   Name: Terri Burns  MRN: [id number] DOB: 06/06/1951   Reason for Encounter: Medication Review/General Adherence Call.  Recent office visits:  No recent Office Visit  Recent consult visits:  No recent [facility] visits:  None in previous 6 months  Medications: Outpatient Encounter Medications as of 10/07/2020  Medication Sig  . albuterol (VENTOLIN HFA) 108 (90 Base) MCG/ACT inhaler Inhale 1-2 puffs into the lungs every 6 (six) hours as needed for wheezing or shortness of breath.  Marland Kitchen amLODipine (NORVASC) 5 MG tablet Take 1 tablet (5 mg total) by mouth daily.  Marland Kitchen aspirin 81 MG EC tablet TAKE 1 TABLET BY MOUTH EVERY DAY  . atorvastatin (LIPITOR) 20 MG tablet Take 1 tablet (20 mg total) by mouth daily.  . citalopram (CELEXA) 20 MG tablet Take 1 tablet (20 mg total) by mouth daily.  . fluticasone (FLONASE) 50 MCG/ACT nasal spray Place 2 sprays into both nostrils daily.  . Fluticasone-Umeclidin-Vilant (TRELEGY ELLIPTA) 100-62.5-25 MCG/INH AEPB Take 1 puff by mouth daily.  Marland Kitchen ipratropium-albuterol (DUONEB) 0.5-2.5 (3) MG/3ML SOLN Inhale 3 mLs into the lungs every 4 (four) hours as needed.  . loratadine (CLARITIN) 10 MG tablet Take 1 tablet (10 mg total) by mouth daily.  Marland Kitchen losartan (COZAAR) 100 MG tablet Take 1 tablet (100 mg total) by mouth daily.  . metoprolol succinate (TOPROL-XL) 25 MG 24 hr tablet Take 1 tablet (25 mg total) by mouth daily.  . pantoprazole (PROTONIX) 40 MG tablet Take 1 tablet (40 mg total) by mouth daily.  . pregabalin (LYRICA) 50 MG capsule Take 1 capsule (50 mg total) by mouth 3 (three) times daily. Start with one qpm and increase dosage by one capsule every 3rd day max of three capsules daily (Patient not taking: Reported on 09/13/2020)   No facility-administered encounter medications on file as of 10/07/2020.    Star Rating Drugs: Atorvastatin 20 mg last filled on 09/17/2020 for 30 day supply at [facility] Losartan 100 mg  Last filled on 09/17/2020 [facility].  Called patient and discussed medication adherence  with patient, no issues at this time with current medication.   Patient denies ED visit since her last CPP follow up.  Patient denies any side effects with her medication. Patient denies any problems with her current pharmacy    1) How many cigarettes are you currently smoking  Patient states she has 3 cigarettes daily. 2) Have you removed all cigarettes from your home and cleaned all of your ash trays?  Patient states she has not removed all cigarettes from home or clean her ash trays. 3) How are you managing any cravings or withdrawal symptoms?  Patient states she is using nicotine patches.Patient states her son is bring her three box of patches today.Patient reports her appetite has increase a little bit. 4) Is there anything else we can do to help support you in your quit attempt?  Patient reports she will continue to use the patches to help stop smoking.Patient states she thinks she is good right now and she will call back if she needs anything else to help quit smoking.  [place] Pharmacist Assistant [phone]

## 2020-10-08 ENCOUNTER — Ambulatory Visit: Admission: RE | Admit: 2020-10-08 | Payer: Medicare Other | Source: Ambulatory Visit

## 2020-10-08 DIAGNOSIS — J441 Chronic obstructive pulmonary disease with (acute) exacerbation: Secondary | ICD-10-CM | POA: Diagnosis not present

## 2020-10-08 DIAGNOSIS — J9601 Acute respiratory failure with hypoxia: Secondary | ICD-10-CM | POA: Diagnosis not present

## 2020-10-08 DIAGNOSIS — E785 Hyperlipidemia, unspecified: Secondary | ICD-10-CM | POA: Diagnosis not present

## 2020-10-08 DIAGNOSIS — F32A Depression, unspecified: Secondary | ICD-10-CM | POA: Diagnosis not present

## 2020-10-08 DIAGNOSIS — K219 Gastro-esophageal reflux disease without esophagitis: Secondary | ICD-10-CM | POA: Diagnosis not present

## 2020-10-08 DIAGNOSIS — I10 Essential (primary) hypertension: Secondary | ICD-10-CM | POA: Diagnosis not present

## 2020-10-09 DIAGNOSIS — F32A Depression, unspecified: Secondary | ICD-10-CM | POA: Diagnosis not present

## 2020-10-09 DIAGNOSIS — E785 Hyperlipidemia, unspecified: Secondary | ICD-10-CM | POA: Diagnosis not present

## 2020-10-09 DIAGNOSIS — J9601 Acute respiratory failure with hypoxia: Secondary | ICD-10-CM | POA: Diagnosis not present

## 2020-10-09 DIAGNOSIS — J441 Chronic obstructive pulmonary disease with (acute) exacerbation: Secondary | ICD-10-CM | POA: Diagnosis not present

## 2020-10-09 DIAGNOSIS — I10 Essential (primary) hypertension: Secondary | ICD-10-CM | POA: Diagnosis not present

## 2020-10-09 DIAGNOSIS — K219 Gastro-esophageal reflux disease without esophagitis: Secondary | ICD-10-CM | POA: Diagnosis not present

## 2020-10-12 ENCOUNTER — Telehealth: Payer: Self-pay | Admitting: Family Medicine

## 2020-10-12 ENCOUNTER — Telehealth: Payer: Self-pay

## 2020-10-12 DIAGNOSIS — I10 Essential (primary) hypertension: Secondary | ICD-10-CM

## 2020-10-12 MED ORDER — AMLODIPINE BESYLATE 5 MG PO TABS: 5.0000 mg | ORAL_TABLET | Freq: Every day | ORAL | 1 refills | Status: DC

## 2020-10-12 NOTE — Telephone Encounter (Signed)
Home Health Verbal Orders - Caller/Agency: Tiffany/ [facility]  Callback Number: [phone] option #2 Requesting Skilled Nursing Frequency: 1x a week for 9 weeks

## 2020-10-12 NOTE — Telephone Encounter (Signed)
Verbal order given to Tiffany per Dr. Ancil Boozer.

## 2020-10-12 NOTE — Addendum Note (Signed)
Addended by: Daron Offer A on: 10/12/2020 11:33 AM   Modules accepted: Orders

## 2020-10-12 NOTE — Progress Notes (Signed)
Chronic Care Management Pharmacy Assistant   Name: Terri Burns  MRN: [id number] DOB: 1951/05/09  Reason for Encounter: Medication Review/Medication Coordination call.   Recent office visits:  No recent Office Visit  Recent consult visits:  No Recent [facility] visits:  None in previous 6 months  Medications: Outpatient Encounter Medications as of 10/12/2020  Medication Sig  . albuterol (VENTOLIN HFA) 108 (90 Base) MCG/ACT inhaler Inhale 1-2 puffs into the lungs every 6 (six) hours as needed for wheezing or shortness of breath.  Marland Kitchen amLODipine (NORVASC) 5 MG tablet Take 1 tablet (5 mg total) by mouth daily.  Marland Kitchen aspirin 81 MG EC tablet TAKE 1 TABLET BY MOUTH EVERY DAY  . atorvastatin (LIPITOR) 20 MG tablet Take 1 tablet (20 mg total) by mouth daily.  . citalopram (CELEXA) 20 MG tablet Take 1 tablet (20 mg total) by mouth daily.  . fluticasone (FLONASE) 50 MCG/ACT nasal spray Place 2 sprays into both nostrils daily.  . Fluticasone-Umeclidin-Vilant (TRELEGY ELLIPTA) 100-62.5-25 MCG/INH AEPB Take 1 puff by mouth daily.  Marland Kitchen ipratropium-albuterol (DUONEB) 0.5-2.5 (3) MG/3ML SOLN Inhale 3 mLs into the lungs every 4 (four) hours as needed.  . loratadine (CLARITIN) 10 MG tablet Take 1 tablet (10 mg total) by mouth daily.  Marland Kitchen losartan (COZAAR) 100 MG tablet Take 1 tablet (100 mg total) by mouth daily.  . metoprolol succinate (TOPROL-XL) 25 MG 24 hr tablet Take 1 tablet (25 mg total) by mouth daily.  . pantoprazole (PROTONIX) 40 MG tablet Take 1 tablet (40 mg total) by mouth daily.  . pregabalin (LYRICA) 50 MG capsule Take 1 capsule (50 mg total) by mouth 3 (three) times daily. Start with one qpm and increase dosage by one capsule every 3rd day max of three capsules daily (Patient not taking: Reported on 09/13/2020)   No facility-administered encounter medications on file as of 10/12/2020.    Star Rating Drugs: Atorvastatin 20 mg last fill on 09/17/2020 for 30 day supply at  [facility]. Losartan 100 mg last fill on 09/17/2020 for 30 day supply at [facility].  Reviewed chart for medication changes ahead of medication coordination call.  BP Readings from Last 3 Encounters:  09/15/20 (!) 144/93  08/12/20 (!) 144/92  02/21/20 (!) 200/112    No results found for: HGBA1C   Patient obtains medications through Adherence Packaging  30 Days   Last adherence delivery included:  Trelegy Aer 100 mcg Ellipta  Losartan100mg -take 1 tablet by mouth every day before breakfast  Metoprolol 25 mg-take 1 tablet before breakfast  Citalopram 20 mg-take one tablet in the evening  Pantoprazole 40 mg- take one tablet before breakfast  Atorvastatin 20 mg- take one tablet daily before breakfast  Loratadine 10 mg-take one tablet daily before breakfast  Ipratropium-albuterol 0.5-2.5 MG/ML Solution -Inhale 3 mLs into the lungs every 4 (four) hours as needed.   Patient declined medications last month:  fluticasone nasal spray (PRN)(Patient states patient receive a bottle from the hospital)  Albuterol 108 mcg/Act (PRN)  Aspirin 81 mg EC (OTC)  Celecoxib- Not Taking  Pregabalin 50 mg -capsule three times a day-Patient stop taking  (patient states she does not want to take pregabalin because it is making her feel "bad and funny feeling".  Amlodipine 5 mg one tablet daily before breakfast -  [facility]  [facility].  Nicotine patch (patient [facility].  Patient is due for next  adherence delivery on: 10/21/2020. Called patient and reviewed medications and coordinated delivery.  This delivery to include:  Trelegy Aer 100 mcg Ellipta  Losartan100mg -take 1 tablet by mouth every day before breakfast  Metoprolol 25 mg-take 1 tablet before breakfast  Citalopram 20 mg-take one tablet in the evening  Pantoprazole 40 mg- take one tablet before breakfast  Atorvastatin 20  mg- take one tablet daily before breakfast  Loratadine 10 mg-take one tablet daily before breakfast  Amlodipine 5 mg one tablet daily before breakfast  Patient will need a short fill of Amlodipine 5 mg , prior to adherence delivery.   Coordinated acute fill for Ipratropium-albuterol 0.5-2.5 MG/ML Solution -Inhale 3 mLs into the lungs every 4 (four) hours as needed   to be delivered on 10/13/2020.  I reached out to [facility] to request transfer for Amlodipine on 10/12/2020.Per [facility] patient does not have any more refills.  Patient declined the following medications  Nicotine patch (patient receive [facility] supply [facility] on [facility]2020.  fluticasone nasal spray (PRN)(Patient states patient receive a bottle from the hospital)  Albuterol 108 mcg/Act (PRN)  Aspirin 81 mg EC (OTC)  Celecoxib- Not Taking  Pregabalin 50 mg -capsule three times a day-Patient stop taking  (patient states she does not want to take pregabalin because it is making her feel "bad and funny feeling".  Patient needs refills for Amlodipine.  Confirmed delivery date of 10/22/2020, advised patient that pharmacy will contact them the morning of delivery.

## 2020-10-13 ENCOUNTER — Telehealth: Payer: Self-pay | Admitting: Family Medicine

## 2020-10-13 DIAGNOSIS — I10 Essential (primary) hypertension: Secondary | ICD-10-CM | POA: Diagnosis not present

## 2020-10-13 DIAGNOSIS — E785 Hyperlipidemia, unspecified: Secondary | ICD-10-CM | POA: Diagnosis not present

## 2020-10-13 DIAGNOSIS — F32A Depression, unspecified: Secondary | ICD-10-CM | POA: Diagnosis not present

## 2020-10-13 DIAGNOSIS — K219 Gastro-esophageal reflux disease without esophagitis: Secondary | ICD-10-CM | POA: Diagnosis not present

## 2020-10-13 DIAGNOSIS — J441 Chronic obstructive pulmonary disease with (acute) exacerbation: Secondary | ICD-10-CM | POA: Diagnosis not present

## 2020-10-13 DIAGNOSIS — J9601 Acute respiratory failure with hypoxia: Secondary | ICD-10-CM | POA: Diagnosis not present

## 2020-10-13 NOTE — Telephone Encounter (Signed)
Home Health Verbal Orders - Caller/Agency: [facility] / [facility] Callback [phone] RequestingSkilled Nursing/ Frequency:1 wk  9                     2 visits as needed

## 2020-10-13 NOTE — Telephone Encounter (Signed)
Verbal given 

## 2020-10-14 ENCOUNTER — Inpatient Hospital Stay: Payer: Medicare Other | Admitting: Family Medicine

## 2020-10-14 DIAGNOSIS — E785 Hyperlipidemia, unspecified: Secondary | ICD-10-CM | POA: Diagnosis not present

## 2020-10-14 DIAGNOSIS — K219 Gastro-esophageal reflux disease without esophagitis: Secondary | ICD-10-CM | POA: Diagnosis not present

## 2020-10-14 DIAGNOSIS — I10 Essential (primary) hypertension: Secondary | ICD-10-CM | POA: Diagnosis not present

## 2020-10-14 DIAGNOSIS — J9601 Acute respiratory failure with hypoxia: Secondary | ICD-10-CM | POA: Diagnosis not present

## 2020-10-14 DIAGNOSIS — J441 Chronic obstructive pulmonary disease with (acute) exacerbation: Secondary | ICD-10-CM | POA: Diagnosis not present

## 2020-10-14 DIAGNOSIS — F32A Depression, unspecified: Secondary | ICD-10-CM | POA: Diagnosis not present

## 2020-10-15 ENCOUNTER — Telehealth: Payer: Self-pay | Admitting: Family Medicine

## 2020-10-15 NOTE — Telephone Encounter (Signed)
Terri Burns gave verbal order, called and gave again.

## 2020-10-15 NOTE — Telephone Encounter (Signed)
Home Health Verbal Orders - Caller/Agency: [facility] [place] Number: [phone] [id number] PT Frequency: 1x a week for 5 weeks  She stated she called last week and has not heard anything/ please call and advise asap

## 2020-10-16 ENCOUNTER — Telehealth: Payer: Self-pay | Admitting: Family Medicine

## 2020-10-16 NOTE — Telephone Encounter (Signed)
FYI

## 2020-10-16 NOTE — Telephone Encounter (Signed)
Esther, from [facility], calling stating that the pt has a missed OT visit due to family issue. Please advise.      [phone]

## 2020-10-17 DIAGNOSIS — Z85828 Personal history of other malignant neoplasm of skin: Secondary | ICD-10-CM | POA: Diagnosis not present

## 2020-10-17 DIAGNOSIS — Z9181 History of falling: Secondary | ICD-10-CM | POA: Diagnosis not present

## 2020-10-17 DIAGNOSIS — F32A Depression, unspecified: Secondary | ICD-10-CM | POA: Diagnosis not present

## 2020-10-17 DIAGNOSIS — Z79891 Long term (current) use of opiate analgesic: Secondary | ICD-10-CM | POA: Diagnosis not present

## 2020-10-17 DIAGNOSIS — Z9981 Dependence on supplemental oxygen: Secondary | ICD-10-CM | POA: Diagnosis not present

## 2020-10-17 DIAGNOSIS — F17210 Nicotine dependence, cigarettes, uncomplicated: Secondary | ICD-10-CM | POA: Diagnosis not present

## 2020-10-17 DIAGNOSIS — J441 Chronic obstructive pulmonary disease with (acute) exacerbation: Secondary | ICD-10-CM | POA: Diagnosis not present

## 2020-10-17 DIAGNOSIS — I10 Essential (primary) hypertension: Secondary | ICD-10-CM | POA: Diagnosis not present

## 2020-10-17 DIAGNOSIS — Z7982 Long term (current) use of aspirin: Secondary | ICD-10-CM | POA: Diagnosis not present

## 2020-10-17 DIAGNOSIS — K219 Gastro-esophageal reflux disease without esophagitis: Secondary | ICD-10-CM | POA: Diagnosis not present

## 2020-10-17 DIAGNOSIS — J9601 Acute respiratory failure with hypoxia: Secondary | ICD-10-CM | POA: Diagnosis not present

## 2020-10-17 DIAGNOSIS — Z8781 Personal history of (healed) traumatic fracture: Secondary | ICD-10-CM | POA: Diagnosis not present

## 2020-10-17 DIAGNOSIS — E785 Hyperlipidemia, unspecified: Secondary | ICD-10-CM | POA: Diagnosis not present

## 2020-10-17 DIAGNOSIS — Z87891 Personal history of nicotine dependence: Secondary | ICD-10-CM | POA: Diagnosis not present

## 2020-10-21 ENCOUNTER — Telehealth: Payer: Self-pay

## 2020-10-21 NOTE — Telephone Encounter (Signed)
Left message for patient to call back if she wanted to reschedule her missed lung cancer screening CT scan

## 2020-10-22 DIAGNOSIS — J9601 Acute respiratory failure with hypoxia: Secondary | ICD-10-CM | POA: Diagnosis not present

## 2020-10-22 DIAGNOSIS — I10 Essential (primary) hypertension: Secondary | ICD-10-CM | POA: Diagnosis not present

## 2020-10-22 DIAGNOSIS — Z23 Encounter for immunization: Secondary | ICD-10-CM | POA: Diagnosis not present

## 2020-10-22 DIAGNOSIS — E785 Hyperlipidemia, unspecified: Secondary | ICD-10-CM | POA: Diagnosis not present

## 2020-10-22 DIAGNOSIS — F32A Depression, unspecified: Secondary | ICD-10-CM | POA: Diagnosis not present

## 2020-10-22 DIAGNOSIS — J441 Chronic obstructive pulmonary disease with (acute) exacerbation: Secondary | ICD-10-CM | POA: Diagnosis not present

## 2020-10-22 DIAGNOSIS — K219 Gastro-esophageal reflux disease without esophagitis: Secondary | ICD-10-CM | POA: Diagnosis not present

## 2020-10-23 DIAGNOSIS — F32A Depression, unspecified: Secondary | ICD-10-CM | POA: Diagnosis not present

## 2020-10-23 DIAGNOSIS — I10 Essential (primary) hypertension: Secondary | ICD-10-CM | POA: Diagnosis not present

## 2020-10-23 DIAGNOSIS — J441 Chronic obstructive pulmonary disease with (acute) exacerbation: Secondary | ICD-10-CM | POA: Diagnosis not present

## 2020-10-23 DIAGNOSIS — J9601 Acute respiratory failure with hypoxia: Secondary | ICD-10-CM | POA: Diagnosis not present

## 2020-10-23 DIAGNOSIS — K219 Gastro-esophageal reflux disease without esophagitis: Secondary | ICD-10-CM | POA: Diagnosis not present

## 2020-10-23 DIAGNOSIS — E785 Hyperlipidemia, unspecified: Secondary | ICD-10-CM | POA: Diagnosis not present

## 2020-10-28 DIAGNOSIS — K219 Gastro-esophageal reflux disease without esophagitis: Secondary | ICD-10-CM | POA: Diagnosis not present

## 2020-10-28 DIAGNOSIS — J441 Chronic obstructive pulmonary disease with (acute) exacerbation: Secondary | ICD-10-CM | POA: Diagnosis not present

## 2020-10-28 DIAGNOSIS — I10 Essential (primary) hypertension: Secondary | ICD-10-CM | POA: Diagnosis not present

## 2020-10-28 DIAGNOSIS — J9601 Acute respiratory failure with hypoxia: Secondary | ICD-10-CM | POA: Diagnosis not present

## 2020-10-28 DIAGNOSIS — F32A Depression, unspecified: Secondary | ICD-10-CM | POA: Diagnosis not present

## 2020-10-28 DIAGNOSIS — E785 Hyperlipidemia, unspecified: Secondary | ICD-10-CM | POA: Diagnosis not present

## 2020-10-29 DIAGNOSIS — J9601 Acute respiratory failure with hypoxia: Secondary | ICD-10-CM | POA: Diagnosis not present

## 2020-10-29 DIAGNOSIS — E785 Hyperlipidemia, unspecified: Secondary | ICD-10-CM | POA: Diagnosis not present

## 2020-10-29 DIAGNOSIS — K219 Gastro-esophageal reflux disease without esophagitis: Secondary | ICD-10-CM | POA: Diagnosis not present

## 2020-10-29 DIAGNOSIS — F32A Depression, unspecified: Secondary | ICD-10-CM | POA: Diagnosis not present

## 2020-10-29 DIAGNOSIS — J441 Chronic obstructive pulmonary disease with (acute) exacerbation: Secondary | ICD-10-CM | POA: Diagnosis not present

## 2020-10-29 DIAGNOSIS — I10 Essential (primary) hypertension: Secondary | ICD-10-CM | POA: Diagnosis not present

## 2020-11-05 DIAGNOSIS — I10 Essential (primary) hypertension: Secondary | ICD-10-CM | POA: Diagnosis not present

## 2020-11-05 DIAGNOSIS — K219 Gastro-esophageal reflux disease without esophagitis: Secondary | ICD-10-CM | POA: Diagnosis not present

## 2020-11-05 DIAGNOSIS — J9601 Acute respiratory failure with hypoxia: Secondary | ICD-10-CM | POA: Diagnosis not present

## 2020-11-05 DIAGNOSIS — J441 Chronic obstructive pulmonary disease with (acute) exacerbation: Secondary | ICD-10-CM | POA: Diagnosis not present

## 2020-11-05 DIAGNOSIS — F32A Depression, unspecified: Secondary | ICD-10-CM | POA: Diagnosis not present

## 2020-11-05 DIAGNOSIS — E785 Hyperlipidemia, unspecified: Secondary | ICD-10-CM | POA: Diagnosis not present

## 2020-11-11 ENCOUNTER — Telehealth: Payer: Self-pay

## 2020-11-11 DIAGNOSIS — I10 Essential (primary) hypertension: Secondary | ICD-10-CM

## 2020-11-11 NOTE — Progress Notes (Signed)
Please void note.Open in error

## 2020-11-11 NOTE — Progress Notes (Signed)
Chronic Care Management Pharmacy Assistant   Name: Terri Burns  MRN: [id number] DOB: 11-25-1950  Reason for Encounter: Medication Review /Medication Coordination Call.   Recent office visits:  No recent Office Visit  Recent consult visits:  No recent [facility] visits:  None in previous 6 months  Medications: Outpatient Encounter Medications as of 11/11/2020  Medication Sig   albuterol (VENTOLIN HFA) 108 (90 Base) MCG/ACT inhaler Inhale 1-2 puffs into the lungs every 6 (six) hours as needed for wheezing or shortness of breath.   amLODipine (NORVASC) 5 MG tablet Take 1 tablet (5 mg total) by mouth daily.   aspirin 81 MG EC tablet TAKE 1 TABLET BY MOUTH EVERY DAY   atorvastatin (LIPITOR) 20 MG tablet Take 1 tablet (20 mg total) by mouth daily.   citalopram (CELEXA) 20 MG tablet Take 1 tablet (20 mg total) by mouth daily.   fluticasone (FLONASE) 50 MCG/ACT nasal spray Place 2 sprays into both nostrils daily.   Fluticasone-Umeclidin-Vilant (TRELEGY ELLIPTA) 100-62.5-25 MCG/INH AEPB Take 1 puff by mouth daily.   ipratropium-albuterol (DUONEB) 0.5-2.5 (3) MG/3ML SOLN Inhale 3 mLs into the lungs every 4 (four) hours as needed.   loratadine (CLARITIN) 10 MG tablet Take 1 tablet (10 mg total) by mouth daily.   losartan (COZAAR) 100 MG tablet Take 1 tablet (100 mg total) by mouth daily.   metoprolol succinate (TOPROL-XL) 25 MG 24 hr tablet Take 1 tablet (25 mg total) by mouth daily.   nicotine (NICODERM CQ - DOSED IN MG/24 HOURS) 21 mg/24hr patch Place 21 mg onto the skin daily.   pantoprazole (PROTONIX) 40 MG tablet Take 1 tablet (40 mg total) by mouth daily.   pregabalin (LYRICA) 50 MG capsule Take 1 capsule (50 mg total) by mouth 3 (three) times daily. Start with one qpm and increase dosage by one capsule every 3rd day max of three capsules daily (Patient not taking: Reported on 09/13/2020)   No facility-administered encounter medications on file as of 11/11/2020.   Star  Rating Drugs: Atorvastatin 20 mg last filled on 10/15/2020 for 30 day supply at [facility]. Losartan 100 mg last filled on 10/15/2020 for 30 day supply at [facility].  Reviewed chart for medication changes ahead of medication coordination call.  BP Readings from Last 3 Encounters:  09/15/20 (!) 144/93  08/12/20 (!) 144/92  02/21/20 (!) 200/112    No results found for: HGBA1C   Patient obtains medications through Adherence Packaging  30 Days   Last adherence delivery included:  Trelegy Aer 100 mcg Ellipta Losartan 100 mg-take 1 tablet by mouth every day before breakfast Metoprolol 25 mg-take 1 tablet before breakfast Citalopram 20 mg-take one tablet in the evening Pantoprazole 40 mg- take one tablet before breakfast Atorvastatin 20 mg- take one tablet daily before breakfast Loratadine 10 mg-take one tablet daily before breakfast Amlodipine 5 mg one tablet daily before breakfast  Coordinated acute fill for Ipratropium-albuterol 0.5-2.5 MG/ML Solution -Inhale 3 mLs into the lungs every 4 (four) hours as needed   to be delivered on 10/13/2020  Patient declined medications last month: Nicotine patch ([facility]. fluticasone nasal spray (PRN)(Patient states patient receive a bottle from the hospital) Albuterol 108 mcg/Act (PRN) Aspirin 81 mg EC (OTC) Celecoxib- Not Taking Pregabalin 50 mg -capsule three times a day-Patient stop taking  (patient states she does not want to take pregabalin because it is making her feel "bad and funny feeling".  Patient is due for next adherence delivery on: 11/19/2020. Called patient and reviewed medications and coordinated delivery.  This delivery to include: Trelegy Aer 100 mcg Ellipta Losartan 100 mg-take 1 tablet by mouth every day Evening Meals Metoprolol 25 mg-take 1 tablet Evening Meals Citalopram 20 mg-take one tablet in the Evening Meals Pantoprazole 40 mg- take one tablet  Evening Meals Atorvastatin 20 mg- take one tablet daily before breakfast Loratadine 10 mg-take one tablet daily before breakfast Amlodipine 5 mg one tablet daily before breakfast Ipratropium-albuterol 0.5-2.5 MG/ML Solution -Inhale 3 mLs into the lungs every 4 (four) hours as needed - Patient states she she uses solution twice to three times daily and would like a 90 day supply instead of 30 day supply).  Patient declined the following medications  Nicotine patch ([facility], patient states her son brought her a few more boxes) fluticasone nasal spray (PRN)(Patient states patient receive a bottle from the hospital, patient states she has 2 bottle on hand as of 11/12/2020) Albuterol 108 mcg/Act (PRN) Aspirin 81 mg EC (OTC) Celecoxib- Not Taking Pregabalin 50 mg -capsule three times a day-Patient stop taking  (patient states she does not want to take pregabalin because it is making her feel "bad and funny feeling".  Patient needs refills for Amlodipine  mg .  Confirmed delivery date of 11/19/2020, advised patient that pharmacy will contact them the morning of delivery.  Patient is schedule with clinical pharmacist for a telephone follow up on 12/24/2020 at 1:00 pm.  Patient is schedule for a follow up with her PCP on 02/19/2021.  [place] Pharmacist Assistant [phone]   Patient is schedule for telephone follow up appointment with the clinical pharmacist on 12/24/2020 at 1:00 pm. Patient is schedule for follow up with her PCP on 02/19/2021.

## 2020-11-12 DIAGNOSIS — J9601 Acute respiratory failure with hypoxia: Secondary | ICD-10-CM | POA: Diagnosis not present

## 2020-11-12 DIAGNOSIS — F32A Depression, unspecified: Secondary | ICD-10-CM | POA: Diagnosis not present

## 2020-11-12 DIAGNOSIS — J441 Chronic obstructive pulmonary disease with (acute) exacerbation: Secondary | ICD-10-CM | POA: Diagnosis not present

## 2020-11-12 DIAGNOSIS — K219 Gastro-esophageal reflux disease without esophagitis: Secondary | ICD-10-CM | POA: Diagnosis not present

## 2020-11-12 DIAGNOSIS — I10 Essential (primary) hypertension: Secondary | ICD-10-CM | POA: Diagnosis not present

## 2020-11-12 DIAGNOSIS — E785 Hyperlipidemia, unspecified: Secondary | ICD-10-CM | POA: Diagnosis not present

## 2020-11-13 MED ORDER — AMLODIPINE BESYLATE 5 MG PO TABS: 5.0000 mg | ORAL_TABLET | Freq: Every day | ORAL | 0 refills | Status: DC

## 2020-11-13 NOTE — Addendum Note (Signed)
Addended by: Daron Offer A on: 11/13/2020 11:38 AM   Modules accepted: Orders

## 2020-12-07 ENCOUNTER — Other Ambulatory Visit: Payer: Self-pay | Admitting: Family Medicine

## 2020-12-07 DIAGNOSIS — I2510 Atherosclerotic heart disease of native coronary artery without angina pectoris: Secondary | ICD-10-CM

## 2020-12-07 DIAGNOSIS — J3089 Other allergic rhinitis: Secondary | ICD-10-CM

## 2020-12-07 DIAGNOSIS — I700 Atherosclerosis of aorta: Secondary | ICD-10-CM

## 2020-12-07 DIAGNOSIS — F330 Major depressive disorder, recurrent, mild: Secondary | ICD-10-CM

## 2020-12-07 DIAGNOSIS — I10 Essential (primary) hypertension: Secondary | ICD-10-CM

## 2020-12-07 DIAGNOSIS — K219 Gastro-esophageal reflux disease without esophagitis: Secondary | ICD-10-CM

## 2020-12-10 ENCOUNTER — Telehealth: Payer: Self-pay

## 2020-12-10 DIAGNOSIS — J432 Centrilobular emphysema: Secondary | ICD-10-CM

## 2020-12-10 NOTE — Chronic Care Management (AMB) (Signed)
Chronic Care Management Pharmacy Assistant   Name: Terri Burns  MRN: [id number] DOB: 07-22-1950  Reason for Encounter: Medication Review/Medication Coordination Call   Recent office visits:  No new office visits  Recent consult visits:  No New consult visits  Hospital visits:  None in previous 6 months  Medications: Outpatient Encounter Medications as of 12/10/2020  Medication Sig   ALLERGY RELIEF 10 MG tablet TAKE ONE TABLET BY MOUTH BEFORE BREAKFAST   atorvastatin (LIPITOR) 20 MG tablet TAKE ONE TABLET BY MOUTH BEFORE BREAKFAST   citalopram (CELEXA) 20 MG tablet TAKE ONE TABLET BY MOUTH EVERY EVENING   losartan (COZAAR) 100 MG tablet TAKE ONE TABLET BY MOUTH EVERY EVENING   metoprolol succinate (TOPROL-XL) 25 MG 24 hr tablet TAKE ONE TABLET BY MOUTH EVERY EVENING   pantoprazole (PROTONIX) 40 MG tablet TAKE ONE TABLET BY MOUTH EVERY EVENING   albuterol (VENTOLIN HFA) 108 (90 Base) MCG/ACT inhaler Inhale 1-2 puffs into the lungs every 6 (six) hours as needed for wheezing or shortness of breath.   amLODipine (NORVASC) 5 MG tablet Take 1 tablet (5 mg total) by mouth daily.   aspirin 81 MG EC tablet TAKE 1 TABLET BY MOUTH EVERY DAY   fluticasone (FLONASE) 50 MCG/ACT nasal spray Place 2 sprays into both nostrils daily.   Fluticasone-Umeclidin-Vilant (TRELEGY ELLIPTA) 100-62.5-25 MCG/INH AEPB Take 1 puff by mouth daily.   ipratropium-albuterol (DUONEB) 0.5-2.5 (3) MG/3ML SOLN Inhale 3 mLs into the lungs every 4 (four) hours as needed.   nicotine (NICODERM CQ - DOSED IN MG/24 HOURS) 21 mg/24hr patch Place 21 mg onto the skin daily.   pregabalin (LYRICA) 50 MG capsule Take 1 capsule (50 mg total) by mouth 3 (three) times daily. Start with one qpm and increase dosage by one capsule every 3rd day max of three capsules daily (Patient not taking: Reported on 09/13/2020)   No facility-administered encounter medications on file as of 12/10/2020.    Care Gaps: Zoster Vaccines- Shingrix,  COLONOSCOPY, COVID-19 Vaccine, MAMMOGRAM, DEXA SCAN    Star Rating Drugs: Atorvastatin 20 mg last filled on 11/16/2020 for a 30 day supply with [facility] Losartan 100 mg last filled on 11/16/2020 for a 30-Day supply with [facility]   Reviewed chart for medication changes ahead of medication coordination call.   BP Readings from Last 3 Encounters:  09/15/20 (!) 144/93  08/12/20 (!) 144/92  02/21/20 (!) 200/112    No results found for: HGBA1C   Patient obtains medications through Adherence Packaging  30 Days   Last adherence delivery included:  Trelegy Aer 100 mcg Ellipta- 1 puff by mouth daily Losartan 100 mg-take 1 tablet by mouth every day Evening Meals Metoprolol 25 mg-take 1 tablet Evening Meals Citalopram 20 mg-take one tablet in the Evening Meals Pantoprazole 40 mg- take one tablet Evening Meals Atorvastatin 20 mg- take one tablet daily before breakfast Loratadine 10 mg-take one tablet daily before breakfast Amlodipine 5 mg one tablet daily before breakfast Ipratropium-albuterol 0.5-2.5 MG/ML Solution -Inhale 3 mLs into the lungs every 4 (four) hours as needed - Patient states she she uses solution twice to three times daily and would like a 90 day supply instead of 30 day supply). Quantify of 180  Patient declined medications last month due to PRN use/additional supply on hand. Nicotine patch ([facility]. fluticasone nasal spray (PRN)(Patient states patient receive a bottle from the hospital) Albuterol 108 mcg/Act (PRN) Aspirin 81 mg EC (OTC)  Celecoxib- Not Taking Pregabalin 50 mg -capsule three times a day-Patient stop taking  (patient states she does not want to take pregabalin because it is making her feel "bad and funny feeling".  Patient is due for next adherence delivery on: 12/21/2020.  Called patient and reviewed medications and coordinated delivery.  This delivery to include: Trelegy Aer 100  mcg Ellipta- 1 puff by mouth daily Losartan 100 mg-take 1 tablet by mouth every day Evening Meals Metoprolol 25 mg-take 1 tablet Evening Meals Citalopram 20 mg-take one tablet in the Evening Meals Pantoprazole 40 mg- take one tablet Evening Meals Atorvastatin 20 mg- take one tablet daily before breakfast Loratadine 10 mg-take one tablet daily before breakfast Amlodipine 5 mg one tablet daily before breakfast Ipratropium-albuterol 0.5-2.5 MG/ML Solution -Inhale 3 mLs into the lungs every 4 (four) hours as needed - Patient states she she uses solution twice to three times daily and would like a 90 day supply instead of 30 day supply). Quantify of 120 for a 30 day supply pt. Is requesting 90-Day supply  Patient declined the following medications due to PRN Nicotine patch ([facility]. fluticasone nasal spray (PRN)(Patient states patient receive a bottle from the hospital) Albuterol 108 mcg/Act (PRN) Aspirin 81 mg EC (OTC) Celecoxib- Not Taking Pregabalin 50 mg -capsule three times a day-Patient stop taking  (patient states she does not want to take pregabalin because it is making her feel "bad and funny feeling".  Patient needs refills for  Trelegy, Losartan, Pantoprazole, Citalopram, Atorvastatin, Metoprolol ER, Loratadine, Amlodipine... medications were requested via [facility]  Confirmed delivery date of 12/21/2020 2nd Route after 3 pm, advised patient that pharmacy will contact them the morning of delivery.  AWV completed 07/09/2020 Next appointment with [name], CPP 12/24/2020 @ [place], CPA/CMA Catering manager Phone: [phone]

## 2020-12-14 ENCOUNTER — Telehealth: Payer: Self-pay | Admitting: *Deleted

## 2020-12-14 NOTE — Telephone Encounter (Signed)
Left message for patient to see if she wanted to reschedule her missed low dose lung cancer screening CT scan, that was scheduled in April. Instructed patient to call back [phone]) to verify information and to reschedule.

## 2020-12-15 MED ORDER — IPRATROPIUM-ALBUTEROL 0.5-2.5 (3) MG/3ML IN SOLN: 3.0000 mL | RESPIRATORY_TRACT | 5 refills | Status: DC | PRN

## 2020-12-15 NOTE — Addendum Note (Signed)
Addended by: Daron Offer A on: 12/15/2020 08:42 AM   Modules accepted: Orders

## 2020-12-17 ENCOUNTER — Telehealth: Payer: Self-pay

## 2020-12-17 DIAGNOSIS — J432 Centrilobular emphysema: Secondary | ICD-10-CM

## 2020-12-17 NOTE — Progress Notes (Signed)
    Chronic Care Management Pharmacy Assistant   Name: LURDES HALTIWANGER  MRN: [id number] DOB: 1951-04-12  I am contacting this patient to inform her about her ipratropium-albuterol (DUONEB) 0.5-2.5 (3) MG/3ML SOLN. The patient did request a 90-Day supply of this medication from [facility], but at this time [facility] cannot process part B medications so in order to get this taken care of for her we would need to send it to her local Pharmacy.   Spoke with the patient and informed her that [facility] can not process this medication through her insurance as they are not able to Process Part B at this time, and patient agreed to sending the Prescription [facility] to [facility] on [address], [place] Phone [phone]  Task/ Message sent to [name], CPP regarding sending a prescription over to this pharmacy.  Lynann Bologna, CPA/CMA Clinical Pharmacist Assistant Phone: [phone]

## 2020-12-18 MED ORDER — IPRATROPIUM-ALBUTEROL 0.5-2.5 (3) MG/3ML IN SOLN: 3.0000 mL | RESPIRATORY_TRACT | 5 refills | Status: DC | PRN

## 2020-12-18 NOTE — Addendum Note (Signed)
Addended by: Daron Offer A on: 12/18/2020 10:16 AM   Modules accepted: Orders

## 2020-12-23 ENCOUNTER — Telehealth: Payer: Self-pay

## 2020-12-23 NOTE — Progress Notes (Signed)
    Chronic Care Management Pharmacy Assistant   Name: BRANDEN VINE  MRN: [id number] DOB: Jul 05, 1950  Patient called to remind of appointment with [name], CPP on 12/24/2020 @ 1300 via telephone.  Patient confirmed appointment   Patient aware of appointment date, time, and type of appointment (either telephone or in person). Patient aware to have/bring all medications, supplements, blood pressure and/or blood sugar logs to visit.  Questions: Have you had any recent office visit or specialist visit outside of [address]? No  Are there any concerns you would like to discuss during your office visit? Yes she wasn't able to get her Ipr[facility]ol [facility] but patient wasn't sure why they could not fill it for her. [facility] and they stated it needed a PA. I contacted the patient back and verified her insurance. She stated that she does have Drug Coverage through [facility] Member ID # [id number] Rx BIN [id number] PCN [id number] and [employer] [id number]. I inquired if patient still has Medicare as well and she stated she does. I [facility] to inquire what insurance did they run the Rx thorough and she stated Medicare was the one who asked for the PA and I asked the pharmacist did they run it through Part B since this is a medication used in a nebulizer machine. She stated she would try it that away. The Rx was approved once it was ran correctly. I contacted the patient back and informed her that they would get this ready for her. Patient verbalized understanding.  Are you having any problems obtaining your medications? No   Star Rating Drug: Atorvastatin 20 mg last filled on 12/15/2020 for a 30-Day supply with [facility] Losartan 100 mg last filled on 12/15/2020 for a 30-Day supply with [facility]  Any gaps in medications fill history? No  Care Gaps: Zoster Vaccines- Shingrix COLONOSCOPY COVID-19 Vaccine #3 MAMMOGRAM DEXA  SCAN  Lynann Bologna, CPA/CMA Clinical Pharmacist Assistant Phone: [phone]

## 2020-12-24 ENCOUNTER — Ambulatory Visit (INDEPENDENT_AMBULATORY_CARE_PROVIDER_SITE_OTHER): Payer: Medicare Other

## 2020-12-24 DIAGNOSIS — J432 Centrilobular emphysema: Secondary | ICD-10-CM | POA: Diagnosis not present

## 2020-12-24 DIAGNOSIS — Z720 Tobacco use: Secondary | ICD-10-CM

## 2020-12-24 NOTE — Progress Notes (Signed)
Chronic Care Management Pharmacy Note  12/24/2020 Name:  Terri Burns MRN:  [id number] DOB:  1951-03-19  Summary: Patient presents for CCM follow-up. She is doing well overall. She continues to smoke, but is down to 2-3 cigarettes daily. She is adherent to her inhalers and nebulizer solution.   Recommendations/Changes made from today's visit: Recommend nicotine gum 2 mg for breakthrough cravings  Plan: CPP follow-up in 3 months   Subjective: Terri Burns is an 70 y.o. year old female who is a primary patient of Steele Sizer, MD.  The CCM team was consulted for assistance with disease management and care coordination needs.    Engaged with patient by telephone for follow up visit in response to provider referral for pharmacy case management and/or care coordination services.   Consent to Services:  The patient was given information about Chronic Care Management services, agreed to services, and gave verbal consent prior to initiation of services.  Please see initial visit note for detailed documentation.   Patient Care Team: Steele Sizer, MD as PCP - General (Family Medicine) Nadene Rubins, DO as Referring Physician (Physical Medicine and Rehabilitation) Germaine Pomfret, [facility] as Pharmacist (Pharmacist)  Recent office visits: 08/12/20: Patient presented to Dr. Ancil Boozer for follow-up. BP 144/92. Losartan increased to 100 mg daily. Patient started on Lyric 50 mg TID.   Recent consult visits: 08/03/20: Patient presented to Dr. Nehemiah Massed (Dermatology)   Hospital visits: Medication Reconciliation was completed by comparing discharge summary, patient's EMR and Pharmacy list, and upon discussion with patient.   Admitted to the hospital on 09/13/2020 due to COPD exacerbation. Discharge date was 09/15/2020. Discharged from [place]?Medications Started at [facility] Discharge:?? -started Amlodipine 5 mg due to Hypertension elevated -Started Nicotine  21 mg patch Medication Changes at Hospital Discharge: -Changed none   Medications Discontinued at Hospital Discharge: -Stopped Celecoxib 200 mg    Medications that remain the same after Hospital Discharge:??  -All other medications will remain the same.  Objective:  Lab Results  Component Value Date   CREATININE 0.67 09/14/2020   BUN 18 09/14/2020   GFRNONAA >60 09/14/2020   GFRAA 104 02/21/2020   NA 137 09/14/2020   K 4.3 09/14/2020   CALCIUM 8.4 (L) 09/14/2020   CO2 24 09/14/2020   GLUCOSE 110 (H) 09/14/2020    No results found for: HGBA1C, FRUCTOSAMINE, GFR, MICROALBUR  Last diabetic Eye exam: No results found for: HMDIABEYEEXA  Last diabetic Foot exam: No results found for: HMDIABFOOTEX   Lab Results  Component Value Date   CHOL 173 02/21/2020   HDL 101 02/21/2020   LDLCALC 61 02/21/2020   TRIG 39 02/21/2020   CHOLHDL 1.7 02/21/2020    Hepatic Function Latest Ref Rng & Units 02/21/2020 03/13/2019 08/30/2018  Total Protein 6.1 - 8.1 g/dL 6.9 6.8 5.9(L)  Albumin 3.6 - 5.1 g/dL - - -  AST 10 - 35 U/L 22 28 33  ALT 6 - 29 U/L 19 24 30(H)  Alk Phosphatase 33 - 130 U/L - - -  Total Bilirubin 0.2 - 1.2 mg/dL 0.5 0.4 0.3    No results found for: TSH, FREET4  CBC Latest Ref Rng & Units 09/14/2020 09/13/2020 02/21/2020  WBC 4.0 - 10.5 K/uL 8.8 9.6 8.0  Hemoglobin 12.0 - 15.0 g/dL 11.3(L) 13.1 12.6  Hematocrit 36.0 - 46.0 % 35.4(L) 39.9 39.3  Platelets 150 - 400 K/uL 211 254 254    No results found for: VD25OH  Clinical ASCVD: Yes  The ASCVD Risk score Mikey Bussing DC Jr., et al., 2013) failed to calculate for the following reasons:   The valid HDL cholesterol range is 20 to 100 mg/dL    Depression screen [facility] 2/9 08/12/2020 07/09/2020 02/21/2020  Decreased Interest 1 0 0  Down, Depressed, Hopeless 0 0 0  PHQ - 2 Score 1 0 0  Altered sleeping 0 - 0  Tired, decreased energy 1 - 1  Change in appetite 0 - 0  Feeling bad or failure about yourself  0 - 0  Trouble concentrating  0 - 0  Moving slowly or fidgety/restless 0 - 0  Suicidal thoughts 0 - 0  PHQ-9 Score 2 - 1  Difficult doing work/chores - - Not difficult at all  Some recent data might be hidden    Social History   Tobacco Use  Smoking Status Every Day   Packs/day: 0.25   Years: 45.00   Pack years: 11.25   Types: Cigarettes   Start date: 12/10/1973  Smokeless Tobacco Never  Tobacco Comments   4 cigarettes daily    BP Readings from Last 3 Encounters:  09/15/20 (!) 144/93  08/12/20 (!) 144/92  02/21/20 (!) 200/112   Pulse Readings from Last 3 Encounters:  09/15/20 87  08/12/20 78  02/21/20 76   Wt Readings from Last 3 Encounters:  09/13/20 130 lb (59 kg)  08/12/20 132 lb (59.9 kg)  02/21/20 125 lb 3.2 oz (56.8 kg)   BMI Readings from Last 3 Encounters:  09/13/20 25.39 kg/m  08/12/20 25.78 kg/m  02/21/20 24.45 kg/m    Assessment/Interventions: Review of patient past medical history, allergies, medications, health status, including review of consultants reports, laboratory and other test data, was performed as part of comprehensive evaluation and provision of chronic care management services.   SDOH:  (Social Determinants of Health) assessments and interventions performed: Yes   SDOH Screenings   Alcohol Screen: Not on file  Depression (PHQ2-9): Low Risk    PHQ-2 Score: 2  Financial Resource Strain: Low Risk    Difficulty of Paying Living Expenses: Not hard at all  Food Insecurity: No Food Insecurity   Worried About Charity fundraiser in the Last Year: Never true   Ran Out of Food in the Last Year: Never true  Housing: Low Risk    Last Housing Risk Score: 0  Physical Activity: Inactive   Days of Exercise per Week: 0 days   Minutes of Exercise per Session: 0 min  Social Connections: Moderately Integrated   Frequency of Communication with Friends and Family: More than three times a week   Frequency of Social Gatherings with Friends and Family: More than three times a week    Attends Religious Services: More than 4 times per year   Active Member of [employer] or Organizations: Yes   Attends Archivist Meetings: More than 4 times per year   Marital Status: Widowed  Stress: No Stress Concern Present   Feeling of Stress : Not at all  Tobacco Use: High Risk   Smoking Tobacco Use: Every Day   Smokeless Tobacco Use: Never  Transportation Needs: No Transportation Needs   Lack of Transportation (Medical): No   Lack of Transportation (Non-Medical): No    CCM Care Plan  Allergies  Allergen Reactions   Levofloxacin Shortness Of Breath   Penicillins Hives    itching   Meloxicam     dizziness   Nsaids Other (See Comments)  Patient states she can tolerate up to three doses per day without incident    Medications Reviewed Today     Reviewed by Germaine Pomfret, [facility] (Pharmacist) on 10/12/20 at 1132  Med List Status: <None>   Medication Order Taking? Sig Documenting Provider Last Dose Status Informant  albuterol (VENTOLIN HFA) 108 (90 Base) MCG/ACT inhaler [id number]  Inhale 1-2 puffs into the lungs every 6 (six) hours as needed for wheezing or shortness of breath. [provider]  Active   amLODipine (NORVASC) 5 MG tablet [id number] Yes Take 1 tablet (5 mg total) by mouth daily. Steele Sizer, MD Taking Active   aspirin 81 MG EC tablet [id number]  TAKE 1 TABLET BY MOUTH EVERY DAY Sowles, Drue Stager, MD  Active Self  atorvastatin (LIPITOR) 20 MG tablet [id number] Yes Take 1 tablet (20 mg total) by mouth daily. Steele Sizer, MD Taking Active Self  citalopram (CELEXA) 20 MG tablet [id number] Yes Take 1 tablet (20 mg total) by mouth daily. Steele Sizer, MD Taking Active Self  fluticasone (FLONASE) 50 MCG/ACT nasal spray [id number]  Place 2 sprays into both nostrils daily. Steele Sizer, MD  Active Self  Fluticasone-Umeclidin-Vilant (TRELEGY ELLIPTA) 100-62.5-25 MCG/INH AEPB [id number] Yes Take 1 puff by mouth daily. Steele Sizer, MD Taking  Active Self  ipratropium-albuterol (DUONEB) 0.5-2.5 (3) MG/3ML SOLN [id number] Yes Inhale 3 mLs into the lungs every 4 (four) hours as needed. Steele Sizer, MD Taking Active Self  loratadine (CLARITIN) 10 MG tablet [id number] Yes Take 1 tablet (10 mg total) by mouth daily. Steele Sizer, MD Taking Active Self  losartan (COZAAR) 100 MG tablet [id number] Yes Take 1 tablet (100 mg total) by mouth daily. Steele Sizer, MD Taking Active Self  metoprolol succinate (TOPROL-XL) 25 MG 24 hr tablet [id number] Yes Take 1 tablet (25 mg total) by mouth daily. Steele Sizer, MD Taking Active Self  nicotine (NICODERM CQ - DOSED IN MG/24 HOURS) 21 mg/24hr patch [id number] Yes Place 21 mg onto the skin daily. [provider] Taking Active   pantoprazole (PROTONIX) 40 MG tablet [id number] Yes Take 1 tablet (40 mg total) by mouth daily. Steele Sizer, MD Taking Active Self  pregabalin (LYRICA) 50 MG capsule [id number]  Take 1 capsule (50 mg total) by mouth 3 (three) times daily. 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   Chronic neck pain 01/01/2018   Benign essential HTN 01/01/2018   Senile purpura ([place]) 01/01/2018   Recurrent major depressive disorder, in full remission ([place]) 01/01/2018   Hyperglycemia 09/27/2016   Tobacco abuse 08/20/2015   Allergic rhinitis, seasonal 05/12/2015   Emphysema lung ([place]) 05/12/2015   Degeneration of intervertebral disc of  cervical region 05/12/2015   GERD (gastroesophageal reflux disease) 05/12/2015   Major depression in remission ([place]) 05/12/2015    Immunization History  Administered Date(s) Administered   Fluad Quad(high Dose 65+) 03/13/2019, 02/21/2020   Influenza, High Dose Seasonal PF 03/29/2017  Influenza, Seasonal, Injecte, Preservative Fre 03/26/2012   Influenza,inj,Quad PF,6+ Mos 03/18/2013, 05/12/2015, 05/09/2016   Moderna Sars-Covid-2 Vaccination 10/17/2019, 11/14/2019   Pneumococcal Conjugate-13 08/30/2018   Pneumococcal Polysaccharide-23 03/08/2010, 06/09/2013, 02/21/2020   Tdap 05/27/2013    Conditions to be addressed/monitored:  Hypertension, Hyperlipidemia, Coronary Artery Disease, GERD, COPD, Depression and Tobacco use  There are no care plans that you recently modified to display for this patient.    Medication Assistance: None required.  Patient affirms current coverage meets needs.  Patient's preferred pharmacy is:  [facility] - [place], [place] - [address] [place] [address] Phone: [phone] Fax: [phone]  [id number] [id number]: [phone]: [phone] Patient decided to: Utilize [facility] for medication synchronization, packaging and delivery  Care Plan and Follow Up Patient Decision:  Patient agrees to Care Plan and Follow-up.  Plan: Telephone follow up appointment with care management team member scheduled for:  04/07/2021 at 11:00 AM  [facility]or[phone]

## 2020-12-24 NOTE — Patient Instructions (Signed)
Visit Information It was great speaking with you today!  Please let me know if you have any questions about our visit.   Goals Addressed             This Visit's Progress    Quit smoking / using tobacco   On track    Recommend to attend smoking cessation classes with [facility] to discuss smoking cessation      Track and Manage My Triggers-COPD   On track    Timeframe:  Long-Range Goal Priority:  High Start Date:  09/30/2020                           Expected End Date:  04/01/2021                     Follow Up Date 11/02/2020   - avoid second hand smoke - eliminate smoking in my home - identify and remove indoor air pollutants - limit outdoor activity during cold weather    Why is this important?   Triggers are activities or things, like tobacco smoke or cold weather, that make your COPD (chronic obstructive pulmonary disease) flare-up.  Knowing these triggers helps you plan how to stay away from them.  When you cannot remove them, you can learn how to manage them.     Notes:         Patient Care Plan: General Pharmacy (Adult)     Problem Identified: Hypertension, Hyperlipidemia, Coronary Artery Disease, GERD, COPD, Depression and Tobacco use   Priority: High     Long-Range Goal: Patient-Specific Goal   Start Date: 09/30/2020  Expected End Date: 04/01/2021  This Visit's Progress: On track  Recent Progress: On track  Priority: High  Note:   Current Barriers:  Unable to maintain control of hypertension Unable to quit smoking  Pharmacist Clinical Goal(s):  Patient will achieve control of blood pressure as evidenced by BP less than 140/90 Quit smoking through collaboration with PharmD and provider.   Interventions: 1:1 collaboration with Steele Sizer, MD regarding development and update of comprehensive plan of care as evidenced by provider attestation and co-signature Inter-disciplinary care team collaboration (see longitudinal plan of care) Comprehensive  medication review performed; medication list updated in electronic medical record  Hypertension (BP goal <140/90) -Not ideally controlled -Current treatment: Amlodipine 5 mg daily  Losartan 100 mg daily  Metoprolol XL 25 mg daily  -Medications previously tried: NA  -Current home readings: 026-378H systolics -Denies hypotensive/hypertensive symptoms -Counseled to monitor BP at home weekly, document, and provide log at future appointments -Recommended to continue current medication  Hyperlipidemia: (LDL goal < 70) -Controlled -Current treatment: Atorvastatin 20 mg daily  -Current antiplatelet treatment: Aspirin 81 mg daily  -Medications previously tried: NA  -Recommended to continue current medication  COPD (Goal: control symptoms and prevent exacerbations) -Uncontrolled -Current treatment  Albuterol 1-2 puffs every 6 hours as needed Trelegy 1 puff daily Duoneb -Medications previously tried: NA  -Gold Grade: NA -Current COPD Classification:  D (high sx, >/=2 exacerbations/yr) -CAT score: 12 (Apr 2022) -Exacerbations requiring treatment in last 6 months: Yes Hospitalized 09/13/20  -Patient reports consistent use of maintenance inhaler -Frequency of rescue inhaler use: nebulizer 3x daily, Albuterol sporadically when out of the home.  -Counseled on Proper inhaler technique; -Recommended to continue current medication  Tobacco use (Goal Quit smoking) -Uncontrolled -Previous quit attempts: NA -Current treatment  Nicotine 21 mg patch -Patient smokes After 30 minutes  of waking -Patient triggers include: drinking coffee - Down to 2-3 cigarettes daily. She still struggles with cravings, although she has identified triggers than tend to lead her to smoking and is trying to develop strategies to manage her cravings. She did not pick up nicotine gum/lozenges as recommended. She also wants to know if she should stay on the 21 mg patch or go to the 14 mg patch.  -Recommend Nicotine Gum  2 mg as needed for breakthrough cravings -Continue Nicotine 21 mg patch until smoke free for 6 weeks.   Depression/Anxiety (Goal: Maintain stable mood) -Controlled -Current treatment: Citalopram 20 mg daily  -Medications previously tried/failed: NA -PHQ9: 2 -Could consider Wellbutrin SR trial to help with smoking cessation.  -Recommended to continue current medication   Patient Goals/Self-Care Activities Patient will:  - check blood pressure 2-3 times weekly, document, and provide at future appointments  Follow Up Plan: Telephone follow up appointment with care management team member scheduled for:  04/07/2021 at 11:00 AM    Patient agreed to services and verbal consent obtained.   Patient verbalizes understanding of instructions provided today and agrees to view in [place].   Terri Burns, [facility] [phone]

## 2021-01-08 ENCOUNTER — Telehealth: Payer: Self-pay

## 2021-01-08 NOTE — Progress Notes (Signed)
Chronic Care Management Pharmacy Assistant   Name: Terri Burns  MRN: [id number] DOB: 11-17-1950  Reason for Encounter: Medication Review/Medication Coordination Call  Recent office visits:  No Recent visits  Recent consult visits:  No recent consult visits  Hospital visits:  None in previous 6 months  Medications: Outpatient Encounter Medications as of 01/08/2021  Medication Sig   ALLERGY RELIEF 10 MG tablet TAKE ONE TABLET BY MOUTH BEFORE BREAKFAST   atorvastatin (LIPITOR) 20 MG tablet TAKE ONE TABLET BY MOUTH BEFORE BREAKFAST   citalopram (CELEXA) 20 MG tablet TAKE ONE TABLET BY MOUTH EVERY EVENING   losartan (COZAAR) 100 MG tablet TAKE ONE TABLET BY MOUTH EVERY EVENING   metoprolol succinate (TOPROL-XL) 25 MG 24 hr tablet TAKE ONE TABLET BY MOUTH EVERY EVENING   pantoprazole (PROTONIX) 40 MG tablet TAKE ONE TABLET BY MOUTH EVERY EVENING   albuterol (VENTOLIN HFA) 108 (90 Base) MCG/ACT inhaler Inhale 1-2 puffs into the lungs every 6 (six) hours as needed for wheezing or shortness of breath.   amLODipine (NORVASC) 5 MG tablet Take 1 tablet (5 mg total) by mouth daily.   aspirin 81 MG EC tablet TAKE 1 TABLET BY MOUTH EVERY DAY   fluticasone (FLONASE) 50 MCG/ACT nasal spray Place 2 sprays into both nostrils daily.   Fluticasone-Umeclidin-Vilant (TRELEGY ELLIPTA) 100-62.5-25 MCG/INH AEPB Take 1 puff by mouth daily.   ipratropium-albuterol (DUONEB) 0.5-2.5 (3) MG/3ML SOLN Inhale 3 mLs into the lungs every 4 (four) hours as needed.   nicotine (NICODERM CQ - DOSED IN MG/24 HOURS) 21 mg/24hr patch Place 21 mg onto the skin daily.   pregabalin (LYRICA) 50 MG capsule Take 1 capsule (50 mg total) by mouth 3 (three) times daily. Start with one qpm and increase dosage by one capsule every 3rd day max of three capsules daily (Patient not taking: Reported on 09/13/2020)   No facility-administered encounter medications on file as of 01/08/2021.   Care Gaps: Zoster Vaccines- Shingrix   COLONOSCOPY COVID-19 Vaccine  3rd Booster INFLUENZA VACCINE (Last completed 11/14/2019) MAMMOGRAM (Last ordered 02/06/2020) DEXA SCAN (Last Ordered 02/21/2020)   Star Rating Drugs: Atorvastatin 20 mg last filled on 12/15/2020 for a 30 day supply with [facility] Losartan 100 mg last filled on 12/15/2020 for a 30-Day supply with [facility]   BP Readings from Last 3 Encounters:  09/15/20 (!) 144/93  08/12/20 (!) 144/92  02/21/20 (!) 200/112    No results found for: HGBA1C   Patient obtains medications through Adherence Packaging  30 Days   Last adherence delivery included:  Trelegy Aer 100 mcg Ellipta- 1 puff by mouth daily Losartan 100 mg-take 1 tablet by mouth every day Evening Meals Metoprolol 25 mg-take 1 tablet Evening Meals Citalopram 20 mg-take one tablet in the Evening Meals Pantoprazole 40 mg- take one tablet Evening Meals Atorvastatin 20 mg- take one tablet daily before breakfast Loratadine 10 mg-take one tablet daily before breakfast Amlodipine 5 mg one tablet daily before breakfast  Patient declined the following medications due to PRN  Nicotine patch ([facility]. fluticasone nasal spray (PRN)(Patient states patient receive a bottle from the hospital) Albuterol 108 mcg/Act (PRN) Aspirin 81 mg EC (OTC) Celecoxib- Not Taking Pregabalin 50 mg -capsule three times a day-Patient stop taking  (patient states she does not want to take pregabalin because it is making her feel "bad and funny feeling".  Patient is due for next adherence delivery on: 01/19/2021 2nd Delivery .  Called patient and reviewed medications and coordinated delivery.  This delivery to include: Trelegy Aer 100 mcg Ellipta- 1 puff by mouth daily Losartan 100 mg-take 1 tablet by mouth every day Evening Meals Metoprolol 25 mg-take 1 tablet Evening Meals Citalopram 20 mg-take one tablet in the Evening Meals Pantoprazole 40 mg- take one  tablet Evening Meals Atorvastatin 20 mg- take one tablet daily before breakfast Loratadine 10 mg-take one tablet daily before breakfast Amlodipine 5 mg one tablet daily before breakfast  Patient declined the following medications:  Nicotine patch (patient [facility] on [facility]2020. fluticasone nasal spray (PRN)(Patient states patient receive a bottle from the hospital) Albuterol 108 mcg/Act (PRN) Aspirin 81 mg EC (OTC) Pregabalin 50 mg -capsule three times a day-Patient stop taking  (patient states she does not want to take pregabalin because it is making her feel "bad and funny feeling".   Patient needs refills for Nothing. (Patient will need refills on all medications for next Med Syn in September)  Confirmed delivery date of 01/19/2021 2nd Route after 3 pm, advised patient that pharmacy will contact them the morning of delivery.  Patient has upcoming appointment with [name], CPP on 04/07/2021 @ 1100  AWV completed 07/09/2020   Lynann Bologna, CPA/CMA Clinical Pharmacist Assistant Phone: [phone]

## 2021-02-02 ENCOUNTER — Inpatient Hospital Stay
Admission: EM | Admit: 2021-02-02 | Discharge: 2021-02-06 | DRG: 190 | Disposition: A | Discharge status: Home Health | Payer: Medicare Other | Attending: Hospitalist | Admitting: Hospitalist

## 2021-02-02 ENCOUNTER — Other Ambulatory Visit: Payer: Self-pay | Admitting: Family Medicine

## 2021-02-02 ENCOUNTER — Emergency Department: Payer: Medicare Other

## 2021-02-02 DIAGNOSIS — E785 Hyperlipidemia, unspecified: Secondary | ICD-10-CM | POA: Diagnosis present

## 2021-02-02 DIAGNOSIS — W2203XS Walked into furniture, sequela: Secondary | ICD-10-CM | POA: Diagnosis not present

## 2021-02-02 DIAGNOSIS — Z981 Arthrodesis status: Secondary | ICD-10-CM | POA: Diagnosis not present

## 2021-02-02 DIAGNOSIS — Z7982 Long term (current) use of aspirin: Secondary | ICD-10-CM | POA: Diagnosis not present

## 2021-02-02 DIAGNOSIS — I160 Hypertensive urgency: Secondary | ICD-10-CM | POA: Diagnosis not present

## 2021-02-02 DIAGNOSIS — Z880 Allergy status to penicillin: Secondary | ICD-10-CM

## 2021-02-02 DIAGNOSIS — G8929 Other chronic pain: Secondary | ICD-10-CM | POA: Diagnosis present

## 2021-02-02 DIAGNOSIS — E161 Other hypoglycemia: Secondary | ICD-10-CM | POA: Diagnosis not present

## 2021-02-02 DIAGNOSIS — R0603 Acute respiratory distress: Secondary | ICD-10-CM | POA: Diagnosis not present

## 2021-02-02 DIAGNOSIS — Z20822 Contact with and (suspected) exposure to covid-19: Secondary | ICD-10-CM | POA: Diagnosis present

## 2021-02-02 DIAGNOSIS — J9602 Acute respiratory failure with hypercapnia: Secondary | ICD-10-CM | POA: Diagnosis not present

## 2021-02-02 DIAGNOSIS — E871 Hypo-osmolality and hyponatremia: Secondary | ICD-10-CM | POA: Diagnosis present

## 2021-02-02 DIAGNOSIS — Z7951 Long term (current) use of inhaled steroids: Secondary | ICD-10-CM | POA: Diagnosis not present

## 2021-02-02 DIAGNOSIS — J9601 Acute respiratory failure with hypoxia: Secondary | ICD-10-CM | POA: Diagnosis present

## 2021-02-02 DIAGNOSIS — R52 Pain, unspecified: Secondary | ICD-10-CM | POA: Diagnosis not present

## 2021-02-02 DIAGNOSIS — E162 Hypoglycemia, unspecified: Secondary | ICD-10-CM | POA: Diagnosis not present

## 2021-02-02 DIAGNOSIS — J80 Acute respiratory distress syndrome: Secondary | ICD-10-CM | POA: Diagnosis not present

## 2021-02-02 DIAGNOSIS — Z8249 Family history of ischemic heart disease and other diseases of the circulatory system: Secondary | ICD-10-CM

## 2021-02-02 DIAGNOSIS — Z886 Allergy status to analgesic agent status: Secondary | ICD-10-CM | POA: Diagnosis not present

## 2021-02-02 DIAGNOSIS — F17210 Nicotine dependence, cigarettes, uncomplicated: Secondary | ICD-10-CM | POA: Diagnosis present

## 2021-02-02 DIAGNOSIS — I10 Essential (primary) hypertension: Secondary | ICD-10-CM | POA: Diagnosis present

## 2021-02-02 DIAGNOSIS — M542 Cervicalgia: Secondary | ICD-10-CM | POA: Diagnosis present

## 2021-02-02 DIAGNOSIS — G629 Polyneuropathy, unspecified: Secondary | ICD-10-CM | POA: Diagnosis present

## 2021-02-02 DIAGNOSIS — S12110S Anterior displaced Type II dens fracture, sequela: Secondary | ICD-10-CM | POA: Diagnosis not present

## 2021-02-02 DIAGNOSIS — Z79899 Other long term (current) drug therapy: Secondary | ICD-10-CM | POA: Diagnosis not present

## 2021-02-02 DIAGNOSIS — Z85828 Personal history of other malignant neoplasm of skin: Secondary | ICD-10-CM

## 2021-02-02 DIAGNOSIS — S12110A Anterior displaced Type II dens fracture, initial encounter for closed fracture: Secondary | ICD-10-CM

## 2021-02-02 DIAGNOSIS — R339 Retention of urine, unspecified: Secondary | ICD-10-CM | POA: Diagnosis present

## 2021-02-02 DIAGNOSIS — M4322 Fusion of spine, cervical region: Secondary | ICD-10-CM | POA: Diagnosis not present

## 2021-02-02 DIAGNOSIS — S12110K Anterior displaced Type II dens fracture, subsequent encounter for fracture with nonunion: Secondary | ICD-10-CM | POA: Diagnosis not present

## 2021-02-02 DIAGNOSIS — F329 Major depressive disorder, single episode, unspecified: Secondary | ICD-10-CM | POA: Diagnosis present

## 2021-02-02 DIAGNOSIS — K219 Gastro-esophageal reflux disease without esophagitis: Secondary | ICD-10-CM | POA: Diagnosis present

## 2021-02-02 DIAGNOSIS — J441 Chronic obstructive pulmonary disease with (acute) exacerbation: Principal | ICD-10-CM | POA: Diagnosis present

## 2021-02-02 DIAGNOSIS — S12112A Nondisplaced Type II dens fracture, initial encounter for closed fracture: Secondary | ICD-10-CM | POA: Diagnosis not present

## 2021-02-02 DIAGNOSIS — R0602 Shortness of breath: Secondary | ICD-10-CM | POA: Diagnosis not present

## 2021-02-02 DIAGNOSIS — G4489 Other headache syndrome: Secondary | ICD-10-CM | POA: Diagnosis not present

## 2021-02-02 DIAGNOSIS — M47812 Spondylosis without myelopathy or radiculopathy, cervical region: Secondary | ICD-10-CM | POA: Diagnosis not present

## 2021-02-02 LAB — COMPREHENSIVE METABOLIC PANEL
ALT: 27 U/L (ref 0–44)
AST: 41 U/L (ref 15–41)
Albumin: 4.2 g/dL (ref 3.5–5.0)
Alkaline Phosphatase: 66 U/L (ref 38–126)
Anion gap: 9 (ref 5–15)
BUN: 16 mg/dL (ref 8–23)
CO2: 25 mmol/L (ref 22–32)
Calcium: 8.4 mg/dL — ABNORMAL LOW (ref 8.9–10.3)
Chloride: 99 mmol/L (ref 98–111)
Creatinine, Ser: 0.89 mg/dL (ref 0.44–1.00)
GFR, Estimated: >60 mL/min (ref >60)
Glucose, Bld: 105 mg/dL — ABNORMAL HIGH (ref 70–99)
Potassium: 4.1 mmol/L (ref 3.5–5.1)
Sodium: 133 mmol/L — ABNORMAL LOW (ref 135–145)
Total Bilirubin: 0.3 mg/dL (ref 0.3–1.2)
Total Protein: 7.8 g/dL (ref 6.5–8.1)

## 2021-02-02 LAB — URINALYSIS, COMPLETE (UACMP) WITH MICROSCOPIC
Bacteria, UA: NONE SEEN
Bilirubin Urine: NEGATIVE
Glucose, UA: NEGATIVE mg/dL
Ketones, ur: NEGATIVE mg/dL
Leukocytes,Ua: NEGATIVE
Nitrite: NEGATIVE
Protein, ur: NEGATIVE mg/dL
Specific Gravity, Urine: 1.01 (ref 1.005–1.030)
WBC, UA: NONE SEEN WBC/hpf (ref 0–5)
pH: 6 (ref 5.0–8.0)

## 2021-02-02 LAB — BASIC METABOLIC PANEL
Anion gap: 11 (ref 5–15)
BUN: 20 mg/dL (ref 8–23)
CO2: 25 mmol/L (ref 22–32)
Calcium: 7.9 mg/dL — ABNORMAL LOW (ref 8.9–10.3)
Chloride: 98 mmol/L (ref 98–111)
Creatinine, Ser: 0.85 mg/dL (ref 0.44–1.00)
GFR, Estimated: >60 mL/min (ref >60)
Glucose, Bld: 167 mg/dL — ABNORMAL HIGH (ref 70–99)
Potassium: 4.3 mmol/L (ref 3.5–5.1)
Sodium: 134 mmol/L — ABNORMAL LOW (ref 135–145)

## 2021-02-02 LAB — CBC
HCT: 38.9 % (ref 36.0–46.0)
Hemoglobin: 12.7 g/dL (ref 12.0–15.0)
MCH: 28.9 pg (ref 26.0–34.0)
MCHC: 32.6 g/dL (ref 30.0–36.0)
MCV: 88.4 fL (ref 80.0–100.0)
Platelets: 274 10*3/uL (ref 150–400)
RBC: 4.4 MIL/uL (ref 3.87–5.11)
RDW: 14.1 % (ref 11.5–15.5)
WBC: 11.6 10*3/uL — ABNORMAL HIGH (ref 4.0–10.5)
nRBC: 0 % (ref 0.0–0.2)

## 2021-02-02 LAB — BLOOD GAS, ARTERIAL
Acid-base deficit: 2.7 mmol/L — ABNORMAL HIGH (ref 0.0–2.0)
Acid-base deficit: 0.8 mmol/L (ref 0.0–2.0)
Bicarbonate: 25.7 mmol/L (ref 20.0–28.0)
Bicarbonate: 24.9 mmol/L (ref 20.0–28.0)
Delivery systems: POSITIVE
Delivery systems: POSITIVE
Expiratory PAP: 5
Expiratory PAP: 5
FIO2: 0.5
FIO2: 28
Inspiratory PAP: 10
Inspiratory PAP: 14
O2 Saturation: 99.7 %
O2 Saturation: 97.6 %
Patient temperature: 37
Patient temperature: 37
pCO2 arterial: 60 mmHg — ABNORMAL HIGH (ref 32.0–48.0)
pCO2 arterial: 44 mmHg (ref 32.0–48.0)
pH, Arterial: 7.24 — ABNORMAL LOW (ref 7.350–7.450)
pH, Arterial: 7.36 (ref 7.350–7.450)
pO2, Arterial: 219 mmHg — ABNORMAL HIGH (ref 83.0–108.0)
pO2, Arterial: 101 mmHg (ref 83.0–108.0)

## 2021-02-02 LAB — RESP PANEL BY RT-PCR (FLU A&B, COVID) ARPGX2
Influenza A by PCR: NEGATIVE
Influenza B by PCR: NEGATIVE
SARS Coronavirus 2 by RT PCR: NEGATIVE

## 2021-02-02 LAB — CBC WITH DIFFERENTIAL/PLATELET
Abs Immature Granulocytes: 0.2 10*3/uL — ABNORMAL HIGH (ref 0.00–0.07)
Basophils Absolute: 0.1 10*3/uL (ref 0.0–0.1)
Basophils Relative: 1 %
Eosinophils Absolute: 0.5 10*3/uL (ref 0.0–0.5)
Eosinophils Relative: 4 %
HCT: 38.5 % (ref 36.0–46.0)
Hemoglobin: 13.1 g/dL (ref 12.0–15.0)
Immature Granulocytes: 2 %
Lymphocytes Relative: 42 %
Lymphs Abs: 5.5 10*3/uL — ABNORMAL HIGH (ref 0.7–4.0)
MCH: 30.4 pg (ref 26.0–34.0)
MCHC: 34 g/dL (ref 30.0–36.0)
MCV: 89.3 fL (ref 80.0–100.0)
Monocytes Absolute: 1 10*3/uL (ref 0.1–1.0)
Monocytes Relative: 8 %
Neutro Abs: 5.9 10*3/uL (ref 1.7–7.7)
Neutrophils Relative %: 43 %
Platelets: 320 10*3/uL (ref 150–400)
RBC: 4.31 MIL/uL (ref 3.87–5.11)
RDW: 13.9 % (ref 11.5–15.5)
WBC: 13.1 10*3/uL — ABNORMAL HIGH (ref 4.0–10.5)
nRBC: 0 % (ref 0.0–0.2)

## 2021-02-02 LAB — BRAIN NATRIURETIC PEPTIDE: B Natriuretic Peptide: 283.2 pg/mL — ABNORMAL HIGH (ref 0.0–100.0)

## 2021-02-02 LAB — PROCALCITONIN: Procalcitonin: <0.1 ng/mL

## 2021-02-02 LAB — TROPONIN I (HIGH SENSITIVITY)
Troponin I (High Sensitivity): 4 ng/L (ref <18)
Troponin I (High Sensitivity): 6 ng/L (ref <18)

## 2021-02-02 MED ORDER — FUROSEMIDE 10 MG/ML IJ SOLN
40.0000 mg | Freq: Once | INTRAMUSCULAR | Status: AC
  Administered 2021-02-02: 40 mg via INTRAVENOUS
  Filled 2021-02-02: qty 4

## 2021-02-02 MED ORDER — MORPHINE SULFATE (PF) 4 MG/ML IV SOLN
4.0000 mg | Freq: Once | INTRAVENOUS | Status: AC
  Administered 2021-02-02: 4 mg via INTRAVENOUS
  Filled 2021-02-02: qty 1

## 2021-02-02 MED ORDER — IPRATROPIUM-ALBUTEROL 0.5-2.5 (3) MG/3ML IN SOLN
3.0000 mL | Freq: Four times a day (QID) | RESPIRATORY_TRACT | Status: DC
  Administered 2021-02-02 – 2021-02-03 (×5): 3 mL via RESPIRATORY_TRACT
  Filled 2021-02-02 (×5): qty 3

## 2021-02-02 MED ORDER — ATORVASTATIN CALCIUM 20 MG PO TABS
20.0000 mg | ORAL_TABLET | Freq: Every day | ORAL | Status: DC
  Administered 2021-02-02 – 2021-02-06 (×5): 20 mg via ORAL
  Filled 2021-02-02 (×5): qty 1

## 2021-02-02 MED ORDER — METOPROLOL SUCCINATE ER 25 MG PO TB24
25.0000 mg | ORAL_TABLET | Freq: Every evening | ORAL | Status: DC
  Administered 2021-02-02 – 2021-02-05 (×4): 25 mg via ORAL
  Filled 2021-02-02 (×4): qty 1

## 2021-02-02 MED ORDER — CEFTRIAXONE SODIUM 1 G IJ SOLR
1.0000 g | INTRAMUSCULAR | Status: DC
Start: 2021-02-02 — End: 2021-02-03
  Administered 2021-02-02 – 2021-02-03 (×2): 1 g via INTRAVENOUS
  Filled 2021-02-02 (×3): qty 10

## 2021-02-02 MED ORDER — METHYLPREDNISOLONE SODIUM SUCC 125 MG IJ SOLR
120.0000 mg | INTRAMUSCULAR | Status: AC
  Administered 2021-02-02: 120 mg via INTRAVENOUS
  Filled 2021-02-02: qty 2

## 2021-02-02 MED ORDER — HYDROCOD POLST-CPM POLST ER 10-8 MG/5ML PO SUER: 5.0000 mL | Freq: Two times a day (BID) | ORAL | Status: DC | PRN

## 2021-02-02 MED ORDER — ACETAMINOPHEN 325 MG PO TABS
650.0000 mg | ORAL_TABLET | Freq: Four times a day (QID) | ORAL | Status: DC | PRN
  Administered 2021-02-02 – 2021-02-06 (×9): 650 mg via ORAL
  Filled 2021-02-02 (×9): qty 2

## 2021-02-02 MED ORDER — PREDNISONE 20 MG PO TABS
40.0000 mg | ORAL_TABLET | Freq: Every day | ORAL | Status: AC
  Administered 2021-02-03 – 2021-02-06 (×4): 40 mg via ORAL
  Filled 2021-02-02 (×4): qty 2

## 2021-02-02 MED ORDER — ACETAMINOPHEN 650 MG RE SUPP: 650.0000 mg | Freq: Four times a day (QID) | RECTAL | Status: DC | PRN

## 2021-02-02 MED ORDER — GUAIFENESIN ER 600 MG PO TB12
600.0000 mg | ORAL_TABLET | Freq: Two times a day (BID) | ORAL | Status: DC
  Administered 2021-02-02 – 2021-02-06 (×10): 600 mg via ORAL
  Filled 2021-02-02 (×11): qty 1

## 2021-02-02 MED ORDER — TRAZODONE HCL 50 MG PO TABS
25.0000 mg | ORAL_TABLET | Freq: Every evening | ORAL | Status: DC | PRN
  Filled 2021-02-02: qty 1

## 2021-02-02 MED ORDER — MAGNESIUM SULFATE 2 GM/50ML IV SOLN
2.0000 g | Freq: Once | INTRAVENOUS | Status: AC
  Administered 2021-02-02: 2 g via INTRAVENOUS
  Filled 2021-02-02: qty 50

## 2021-02-02 MED ORDER — ENOXAPARIN SODIUM 40 MG/0.4ML IJ SOSY
40.0000 mg | PREFILLED_SYRINGE | INTRAMUSCULAR | Status: DC
  Administered 2021-02-02 – 2021-02-06 (×5): 40 mg via SUBCUTANEOUS
  Filled 2021-02-02 (×5): qty 0.4

## 2021-02-02 MED ORDER — LORATADINE 10 MG PO TABS
10.0000 mg | ORAL_TABLET | Freq: Every day | ORAL | Status: DC
  Administered 2021-02-02 – 2021-02-06 (×5): 10 mg via ORAL
  Filled 2021-02-02 (×5): qty 1

## 2021-02-02 MED ORDER — ALBUTEROL SULFATE (2.5 MG/3ML) 0.083% IN NEBU
2.5000 mg | INHALATION_SOLUTION | Freq: Four times a day (QID) | RESPIRATORY_TRACT | Status: DC | PRN
  Administered 2021-02-03 – 2021-02-05 (×3): 2.5 mg via RESPIRATORY_TRACT
  Filled 2021-02-02 (×3): qty 3

## 2021-02-02 MED ORDER — IPRATROPIUM-ALBUTEROL 0.5-2.5 (3) MG/3ML IN SOLN
3.0000 mL | Freq: Once | RESPIRATORY_TRACT | Status: AC
  Administered 2021-02-02: 3 mL via RESPIRATORY_TRACT

## 2021-02-02 MED ORDER — SODIUM CHLORIDE 0.9 % IV SOLN: INTRAVENOUS | Status: DC

## 2021-02-02 MED ORDER — AMLODIPINE BESYLATE 5 MG PO TABS
5.0000 mg | ORAL_TABLET | Freq: Every day | ORAL | Status: DC
  Administered 2021-02-02 – 2021-02-05 (×4): 5 mg via ORAL
  Filled 2021-02-02 (×4): qty 1

## 2021-02-02 MED ORDER — FLUTICASONE PROPIONATE 50 MCG/ACT NA SUSP
2.0000 | Freq: Every day | NASAL | Status: DC
  Administered 2021-02-03 – 2021-02-06 (×4): 2 via NASAL
  Filled 2021-02-02: qty 16

## 2021-02-02 MED ORDER — ONDANSETRON HCL 4 MG PO TABS: 4.0000 mg | ORAL_TABLET | Freq: Four times a day (QID) | ORAL | Status: DC | PRN

## 2021-02-02 MED ORDER — CITALOPRAM HYDROBROMIDE 20 MG PO TABS
20.0000 mg | ORAL_TABLET | Freq: Every evening | ORAL | Status: DC
  Administered 2021-02-02 – 2021-02-05 (×4): 20 mg via ORAL
  Filled 2021-02-02 (×4): qty 1

## 2021-02-02 MED ORDER — MAGNESIUM HYDROXIDE 400 MG/5ML PO SUSP
30.0000 mL | Freq: Every day | ORAL | Status: DC | PRN
  Filled 2021-02-02: qty 30

## 2021-02-02 MED ORDER — ONDANSETRON HCL 4 MG/2ML IJ SOLN
4.0000 mg | Freq: Once | INTRAMUSCULAR | Status: AC
  Administered 2021-02-02: 4 mg via INTRAVENOUS
  Filled 2021-02-02: qty 2

## 2021-02-02 MED ORDER — ONDANSETRON HCL 4 MG/2ML IJ SOLN: 4.0000 mg | Freq: Four times a day (QID) | INTRAMUSCULAR | Status: DC | PRN

## 2021-02-02 MED ORDER — PANTOPRAZOLE SODIUM 40 MG PO TBEC
40.0000 mg | DELAYED_RELEASE_TABLET | Freq: Every evening | ORAL | Status: DC
  Administered 2021-02-02 – 2021-02-05 (×4): 40 mg via ORAL
  Filled 2021-02-02 (×4): qty 1

## 2021-02-02 MED ORDER — LOSARTAN POTASSIUM 50 MG PO TABS
100.0000 mg | ORAL_TABLET | Freq: Every evening | ORAL | Status: DC
  Administered 2021-02-02 – 2021-02-05 (×4): 100 mg via ORAL
  Filled 2021-02-02 (×4): qty 2

## 2021-02-02 MED ORDER — METHYLPREDNISOLONE SODIUM SUCC 125 MG IJ SOLR
125.0000 mg | Freq: Once | INTRAMUSCULAR | Status: AC
  Administered 2021-02-02: 125 mg via INTRAVENOUS
  Filled 2021-02-02: qty 2

## 2021-02-02 MED ORDER — LORAZEPAM 2 MG/ML IJ SOLN
0.5000 mg | Freq: Once | INTRAMUSCULAR | Status: AC
  Administered 2021-02-02: 0.5 mg via INTRAVENOUS
  Filled 2021-02-02: qty 1

## 2021-02-02 MED ORDER — ASPIRIN EC 81 MG PO TBEC
81.0000 mg | DELAYED_RELEASE_TABLET | Freq: Every day | ORAL | Status: DC
  Administered 2021-02-02 – 2021-02-06 (×5): 81 mg via ORAL
  Filled 2021-02-02 (×5): qty 1

## 2021-02-02 MED ORDER — IPRATROPIUM-ALBUTEROL 0.5-2.5 (3) MG/3ML IN SOLN
3.0000 mL | Freq: Once | RESPIRATORY_TRACT | Status: AC
Start: 2021-02-02 — End: 2021-02-02
  Administered 2021-02-02: 3 mL via RESPIRATORY_TRACT
  Filled 2021-02-02: qty 3

## 2021-02-02 NOTE — H&P (Signed)
[place]   PATIENT NAME: Terri Burns    MR#:  [id number]  DATE OF BIRTH:  [birth year]  DATE OF ADMISSION:  02/02/2021  PRIMARY CARE PHYSICIAN: Steele Sizer, MD   Patient is coming from: Home  REQUESTING/REFERRING PHYSICIAN: Lurline Hare, MD  CHIEF COMPLAINT:   Chief Complaint  Patient presents with   Respiratory Distress    Ems from home complain of sob    HISTORY OF PRESENT ILLNESS:  Terri Burns is a 70 y.o. Caucasian female with medical history significant for COPD, depression, GERD, hypertension and dyslipidemia, who presented to the ER with acute onset of worsening dyspnea with severe respiratory distress tripoding and having associated retractions in the ER.  She admitted to recent dry cough and wheezing with her dyspnea as well as chills without measured fever.  She denied any chest pain or palpitations.  No nausea or vomiting or abdominal pain.  No dysuria, oliguria or hematuria or flank pain.  ED Course: When she came to the ER blood pressure was 181/121 with a heart rate of 102 and pulse oximetry was 100% on BiPAP.  Labs revealed ABG with pH 7.24, HCO3 25.7 PCO2 of 60 and PO2 of 2019 with O2 sat of 99.7% on 50% FiO2 on BiPAP.  CMP revealed mild hyponatremia natremia and was otherwise unremarkable.  BNP was 283.2 and high-sensitivity troponin I was 4 CBC showed leukocytosis 13.1 with neutrophilia influenza antigens and COVID-19 PCR came back negative.    EKG as reviewed by me : Showed sinus rhythm with a rate of 98 with inferolateral Q waves. Imaging: Portable chest ray showed chronic interstitial lung markings without acute or active cardiopulmonary disease.  The patient was given and 3 DuoNeb's, 2 g of IV magnesium sulfate, 125 mg IV Solu-Medrol, 4 mg of IV morphine sulfate and 4 mg of IV Zofran preceded by 0.5 mg of IV Ativan.  She will be admitted to a progressive unit bed for further evaluation and management. 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    Hyperlipidemia    Hypertension    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    PAST SURGICAL HISTORY:   Past Surgical History:  Procedure Laterality Date   NECK SURGERY N/A 10/2003   SHOULDER SURGERY Left 2004    SOCIAL HISTORY:   Social History   Tobacco Use   Smoking status: Every Day    Packs/day: 0.25    Years: 45.00    Pack years: 11.25    Types: Cigarettes    Start date: 12/10/1973   Smokeless tobacco: Never   Tobacco comments:    4 cigarettes daily   Substance Use Topics   Alcohol use: Yes    Alcohol/week: 0.0 standard drinks    Comment: occasionally    FAMILY HISTORY:   Family History  Problem Relation Age of Onset   Hypertension Mother    Stroke Mother    Heart disease Father    Diabetes Father    Heart disease Daughter    Pulmonary embolism Daughter    Hypertension Son     DRUG ALLERGIES:   Allergies  Allergen Reactions   Levofloxacin Shortness Of Breath   Penicillins Hives  itching   Meloxicam     dizziness   Nsaids Other (See Comments)    Patient states she can tolerate up to three doses per day without incident    REVIEW OF SYSTEMS:   ROS As per history of present illness. All pertinent systems were reviewed above. Constitutional, HEENT, cardiovascular, respiratory, GI, GU, musculoskeletal, neuro, psychiatric, endocrine, integumentary and hematologic systems were reviewed and are otherwise negative/unremarkable except for positive findings mentioned above in the HPI.   MEDICATIONS AT HOME:   Prior to Admission medications   Medication Sig Start Date End Date Taking? Authorizing Provider  albuterol (VENTOLIN HFA) 108 (90 Base) MCG/ACT inhaler Inhale 1-2 puffs into the lungs every 6 (six)  hours as needed for wheezing or shortness of breath.   Yes [provider]  ALLERGY RELIEF 10 MG tablet TAKE ONE TABLET BY MOUTH BEFORE BREAKFAST Patient taking differently: Take 10 mg by mouth daily. 12/08/20  Yes Sowles, Drue Stager, MD  amLODipine (NORVASC) 5 MG tablet Take 1 tablet (5 mg total) by mouth daily. 11/13/20  Yes Sowles, Drue Stager, MD  aspirin 81 MG EC tablet TAKE 1 TABLET BY MOUTH EVERY DAY 04/22/19  Yes Sowles, Drue Stager, MD  atorvastatin (LIPITOR) 20 MG tablet TAKE ONE TABLET BY MOUTH BEFORE BREAKFAST 12/08/20  Yes Sowles, Drue Stager, MD  citalopram (CELEXA) 20 MG tablet TAKE ONE TABLET BY MOUTH EVERY EVENING 12/08/20  Yes Sowles, Drue Stager, MD  fluticasone (FLONASE) 50 MCG/ACT nasal spray Place 2 sprays into both nostrils daily. 01/06/20  Yes Sowles, Drue Stager, MD  Fluticasone-Umeclidin-Vilant (TRELEGY ELLIPTA) 100-62.5-25 MCG/INH AEPB Take 1 puff by mouth daily. 08/12/20  Yes Sowles, Drue Stager, MD  ipratropium-albuterol (DUONEB) 0.5-2.5 (3) MG/3ML SOLN Inhale 3 mLs into the lungs every 4 (four) hours as needed. 12/18/20  Yes Sowles, Drue Stager, MD  losartan (COZAAR) 100 MG tablet TAKE ONE TABLET BY MOUTH EVERY EVENING 12/08/20  Yes Sowles, Drue Stager, MD  metoprolol succinate (TOPROL-XL) 25 MG 24 hr tablet TAKE ONE TABLET BY MOUTH EVERY EVENING 12/08/20  Yes Sowles, Drue Stager, MD  pantoprazole (PROTONIX) 40 MG tablet TAKE ONE TABLET BY MOUTH EVERY EVENING 12/08/20  Yes Sowles, Drue Stager, MD  nicotine (NICODERM CQ - DOSED IN MG/24 HOURS) 21 mg/24hr patch Place 21 mg onto the skin daily. Patient not taking: No sig reported    [provider]  pregabalin (LYRICA) 50 MG capsule Take 1 capsule (50 mg total) by mouth 3 (three) times daily. Start with one qpm and increase dosage by one capsule every 3rd day max of three capsules daily Patient not taking: No sig reported 08/12/20   Steele Sizer, MD      VITAL SIGNS:  Blood pressure (!) 181/165, pulse 94, temperature 98.8 F (37.1 C), temperature source Oral,  resp. rate (!) 24, weight 66 kg, SpO2 93 %.  PHYSICAL EXAMINATION:  Physical Exam  GENERAL:  70 y.o.-year-old Caucasian female patient lying in the bed with mild to moderate respiratory distress with conversational dyspnea on BiPAP. EYES: Pupils equal, round, reactive to light and accommodation. No scleral icterus. Extraocular muscles intact.  HEENT: Head atraumatic, normocephalic. Oropharynx and nasopharynx clear.  NECK:  Supple, no jugular venous distention. No thyroid enlargement, no tenderness.  LUNGS: Diffuse expiratory wheezes with tight expiratory airflow and heart vesicular breathing. CARDIOVASCULAR: Regular rate and rhythm, S1, S2 normal. No murmurs, rubs, or gallops.  ABDOMEN: Soft, nondistended, nontender. Bowel sounds present. No organomegaly or mass.  EXTREMITIES: No pedal edema, cyanosis, or clubbing.  NEUROLOGIC: Cranial nerves II through XII are intact.  Muscle strength 5/5 in all extremities. Sensation intact. Gait not checked.  PSYCHIATRIC: The patient is alert and oriented x 3.  Normal affect and good eye contact. SKIN: No obvious rash, lesion, or ulcer.   LABORATORY PANEL:   CBC Recent Labs  Lab 02/02/21 0125  WBC 13.1*  HGB 13.1  HCT 38.5  PLT 320   ------------------------------------------------------------------------------------------------------------------  Chemistries  Recent Labs  Lab 02/02/21 0125  NA 133*  K 4.1  CL 99  CO2 25  GLUCOSE 105*  BUN 16  CREATININE 0.89  CALCIUM 8.4*  AST 41  ALT 27  ALKPHOS 66  BILITOT 0.3   ------------------------------------------------------------------------------------------------------------------  Cardiac Enzymes No results for input(s): TROPONINI in the last 168 hours. ------------------------------------------------------------------------------------------------------------------  RADIOLOGY:  DG Chest Port 1 View  Result Date: 02/02/2021 CLINICAL DATA:  Shortness of breath. EXAM: PORTABLE  CHEST 1 VIEW COMPARISON:  September 13, 2020 FINDINGS: Stable, diffuse chronic increased lung markings are seen. Very mild, stable scarring and/or atelectasis is seen within the lateral aspect of the left lung base. There is no evidence of a pleural effusion or pneumothorax. The heart size and mediastinal contours are within normal limits. Radiopaque surgical pins are seen within the left humeral head. IMPRESSION: Chronic increased lung markings without acute or active cardiopulmonary disease. Electronically Signed   By: Virgina Norfolk M.D.   On: 02/02/2021 01:51      IMPRESSION AND PLAN:  Active Problems:   COPD exacerbation ([place])  1.  Acute hypoxic and hypercarbic respiratory failure secondary to COPD acute exacerbation, possibly associated with acute bronchitis. - The patient will be admitted to a progressive unit bed. - We will continue bronchodilator therapy with DuoNebs 4 times daily and every 4 hours as needed. - We will continue steroid therapy with IV Solu-Medrol. - We will add antibiotic therapy with IV Rocephin given severity of COPD and congested cough. - We will follow sputum culture. - O2 protocol will be followed. - ABG will be followed and BiPAP will be tapered off as tolerated. - We will hold off Trelegy.  2.  Hypertensive urgency. - We will continue Norvasc and Cozaar and Toprol-XL. - The patient will be placed on as needed IV labetalol.  3.  Dyslipidemia. - We will continue statin therapy.  4.  Major depression. - We will continue Celexa.  5.  Peripheral neuropathy. - We will continue Lyrica.  6.  GERD - We will continue PPI therapy.  DVT prophylaxis: Lovenox.  Code Status: full code.  Family Communication:  The plan of care was discussed in details with the patient (and family). I answered all questions. The patient agreed to proceed with the above mentioned plan. Further management will depend upon hospital course. Disposition Plan: Back to previous home  environment Consults called: none.  All the records are reviewed and case discussed with ED provider.  Status is: Inpatient  Remains inpatient appropriate because:Hemodynamically unstable, Ongoing diagnostic testing needed not appropriate for outpatient work up, Unsafe d/c plan, IV treatments appropriate due to intensity of illness or inability to take PO, and Inpatient level of care appropriate due to severity of illness  Dispo: The patient is from: Home              Anticipated d/c is to: Home              Patient currently is not medically stable to d/c.   Difficult to place patient No   TOTAL TIME TAKING CARE OF THIS PATIENT: 55 minutes.  Christel Mormon M.D on 02/02/2021 at 3:10 AM  [facility]   From 7 PM-7 AM, contact night-coverage [email or web address]  CC: Primary care physician; Steele Sizer, MD

## 2021-02-02 NOTE — ED Provider Notes (Signed)
[facility] Emergency Department Provider Note   ____________________________________________   Event Date/Time   First MD Initiated Contact with Patient 02/02/21 0117     (approximate)  I have reviewed the triage vital signs and the nursing notes.   HISTORY  Chief Complaint Respiratory Distress (Ems from home complain of sob)  Level V caveat: Limited by respiratory distress  HPI Terri Burns is a 70 y.o. female brought to the ED via EMS from home with a chief complaint of respiratory distress.  Patient with a history of COPD not on home oxygen.  Started feeling bad earlier today with shortness of breath; took her regular albuterol nebulizer treatments without relief of symptoms.  Endorses chest tightness.  Denies fever, abdominal pain, nausea, vomiting or diarrhea.  Arrives to the ED on nonrebreather oxygen; given DuoNeb en route.  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    Hyperlipidemia    Hypertension    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    Patient Active Problem List   Diagnosis Date Noted   Depression 09/13/2020   COPD exacerbation ([place]) 09/13/2020   Acute respiratory failure with hypoxia ([place]) 09/13/2020   HLD (hyperlipidemia) 09/13/2020   Skin lesion of face 05/01/2018   Atherosclerosis of aorta ([place]) 03/04/2018   Coronary artery calcification of native artery 03/04/2018   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   Chronic neck pain 01/01/2018   Benign essential HTN 01/01/2018   Senile purpura ([place]) 01/01/2018   Recurrent major depressive disorder, in full remission ([place]) 01/01/2018   Hyperglycemia 09/27/2016   Tobacco abuse 08/20/2015    Allergic rhinitis, seasonal 05/12/2015   Emphysema lung ([place]) 05/12/2015   Degeneration of intervertebral disc of cervical region 05/12/2015   GERD (gastroesophageal reflux disease) 05/12/2015   Major depression in remission ([place]) 05/12/2015    Past Surgical History:  Procedure Laterality Date   NECK SURGERY N/A 10/2003   SHOULDER SURGERY Left 2004    Prior to Admission medications   Medication Sig Start Date End Date Taking? Authorizing Provider  albuterol (VENTOLIN HFA) 108 (90 Base) MCG/ACT inhaler Inhale 1-2 puffs into the lungs every 6 (six) hours as needed for wheezing or shortness of breath.   Yes [provider]  ALLERGY RELIEF 10 MG tablet TAKE ONE TABLET BY MOUTH BEFORE BREAKFAST Patient taking differently: Take 10 mg by mouth daily. 12/08/20  Yes Sowles, Drue Stager, MD  amLODipine (NORVASC) 5 MG tablet Take 1 tablet (5 mg total) by mouth daily. 11/13/20  Yes Sowles, Drue Stager, MD  aspirin 81 MG EC tablet TAKE 1 TABLET BY MOUTH EVERY DAY 04/22/19  Yes Sowles, Drue Stager, MD  atorvastatin (LIPITOR) 20 MG tablet TAKE ONE TABLET BY MOUTH BEFORE BREAKFAST 12/08/20  Yes Sowles, Drue Stager, MD  citalopram (CELEXA) 20 MG tablet TAKE ONE TABLET BY MOUTH EVERY EVENING 12/08/20  Yes Sowles, Drue Stager, MD  fluticasone (FLONASE) 50 MCG/ACT nasal spray Place 2 sprays into both nostrils daily. 01/06/20  Yes Sowles, Drue Stager, MD  Fluticasone-Umeclidin-Vilant (TRELEGY ELLIPTA) 100-62.5-25 MCG/INH AEPB Take 1 puff by mouth daily. 08/12/20  Yes Sowles, Drue Stager, MD  ipratropium-albuterol (DUONEB) 0.5-2.5 (3) MG/3ML SOLN Inhale 3 mLs into the lungs every  4 (four) hours as needed. 12/18/20  Yes Sowles, Drue Stager, MD  losartan (COZAAR) 100 MG tablet TAKE ONE TABLET BY MOUTH EVERY EVENING 12/08/20  Yes Sowles, Drue Stager, MD  metoprolol succinate (TOPROL-XL) 25 MG 24 hr tablet TAKE ONE TABLET BY MOUTH EVERY EVENING 12/08/20  Yes Sowles, Drue Stager, MD  pantoprazole (PROTONIX) 40 MG tablet TAKE ONE TABLET BY MOUTH EVERY EVENING  12/08/20  Yes Sowles, Drue Stager, MD  nicotine (NICODERM CQ - DOSED IN MG/24 HOURS) 21 mg/24hr patch Place 21 mg onto the skin daily. Patient not taking: No sig reported    [provider]  pregabalin (LYRICA) 50 MG capsule Take 1 capsule (50 mg total) by mouth 3 (three) times daily. Start with one qpm and increase dosage by one capsule every 3rd day max of three capsules daily Patient not taking: No sig reported 08/12/20   Steele Sizer, MD    Allergies Levofloxacin, Penicillins, Meloxicam, and Nsaids  Family History  Problem Relation Age of Onset   Hypertension Mother    Stroke Mother    Heart disease Father    Diabetes Father    Heart disease Daughter    Pulmonary embolism Daughter    Hypertension Son     Social History Social History   Tobacco Use   Smoking status: Every Day    Packs/day: 0.25    Years: 45.00    Pack years: 11.25    Types: Cigarettes    Start date: 12/10/1973   Smokeless tobacco: Never   Tobacco comments:    4 cigarettes daily   Vaping Use   Vaping Use: Never used  Substance Use Topics   Alcohol use: Yes    Alcohol/week: 0.0 standard drinks    Comment: occasionally   Drug use: No    Review of Systems  Constitutional: No fever/chills Eyes: No visual changes. ENT: No sore throat. Cardiovascular: Positive for chest tightness. Respiratory: Positive for shortness of breath. Gastrointestinal: No abdominal pain.  No nausea, no vomiting.  No diarrhea.  No constipation. Genitourinary: Negative for dysuria. Musculoskeletal: Negative for back pain. Skin: Negative for rash. Neurological: Negative for headaches, focal weakness or numbness.   ____________________________________________   PHYSICAL EXAM:  VITAL SIGNS: ED Triage Vitals  Enc Vitals Group     BP      Pulse      Resp      Temp      Temp src      SpO2      Weight      Height      Head Circumference      Peak Flow      Pain Score      Pain Loc      Pain Edu?      Excl.  in [place]?     Constitutional: Alert and oriented.  Elderly appearing and in moderate acute distress. Eyes: Conjunctivae are normal. PERRL. EOMI. Head: Atraumatic. Nose: No congestion/rhinnorhea. Mouth/Throat: Mucous membranes are mildly dry. Neck: No stridor.   Cardiovascular: Tachycardic rate, regular rhythm. Grossly normal heart sounds.  Good peripheral circulation. Respiratory: Increased respiratory effort.  Retractions.  Tripoding.  Lungs tight with audible wheezing. Gastrointestinal: Soft and nontender to light or deep palpation. No distention. No abdominal bruits. No CVA tenderness. Musculoskeletal: No lower extremity tenderness nor edema.  No joint effusions. Neurologic:  Normal speech and language. No gross focal neurologic deficits are appreciated.  Skin:  Skin is warm, dry and intact. No rash noted. Psychiatric: Mood and affect are anxious.  Speech and behavior are normal.  ____________________________________________   LABS (all labs ordered are listed, but only abnormal results are displayed)  Labs Reviewed  CBC WITH DIFFERENTIAL/PLATELET - Abnormal; Notable for the following components:      Result Value   WBC 13.1 (*)    Lymphs Abs 5.5 (*)    Abs Immature Granulocytes 0.20 (*)    All other components within normal limits  COMPREHENSIVE METABOLIC PANEL - Abnormal; Notable for the following components:   Sodium 133 (*)    Glucose, Bld 105 (*)    Calcium 8.4 (*)    All other components within normal limits  BRAIN NATRIURETIC PEPTIDE - Abnormal; Notable for the following components:   B Natriuretic Peptide 283.2 (*)    All other components within normal limits  BLOOD GAS, ARTERIAL - Abnormal; Notable for the following components:   pH, Arterial 7.24 (*)    pCO2 arterial 60 (*)    pO2, Arterial 219 (*)    Acid-base deficit 2.7 (*)    All other components within normal limits  RESP PANEL BY RT-PCR (FLU A&B, COVID) ARPGX2  BASIC METABOLIC PANEL  CBC  BLOOD GAS,  ARTERIAL  TROPONIN I (HIGH SENSITIVITY)  TROPONIN I (HIGH SENSITIVITY)   ____________________________________________  EKG  ED ECG REPORT I, Makynlee Kressin J, the attending physician, personally viewed and interpreted this ECG.   Date: 02/02/2021  EKG Time: 0124  Rate: 98  Rhythm: normal sinus rhythm  Axis: Normal  Intervals:none  ST&T Change: Nonspecific  ____________________________________________  RADIOLOGY I, Austen Wygant J, personally viewed and evaluated these images (plain radiographs) as part of my medical decision making, as well as reviewing the written report by the radiologist.  ED MD interpretation: Chronic increased lung markings without acute cardiopulmonary disease  Official radiology report(s): DG Chest Port 1 View  Result Date: 02/02/2021 CLINICAL DATA:  Shortness of breath. EXAM: PORTABLE CHEST 1 VIEW COMPARISON:  September 13, 2020 FINDINGS: Stable, diffuse chronic increased lung markings are seen. Very mild, stable scarring and/or atelectasis is seen within the lateral aspect of the left lung base. There is no evidence of a pleural effusion or pneumothorax. The heart size and mediastinal contours are within normal limits. Radiopaque surgical pins are seen within the left humeral head. IMPRESSION: Chronic increased lung markings without acute or active cardiopulmonary disease. Electronically Signed   By: Virgina Norfolk M.D.   On: 02/02/2021 01:51    ____________________________________________   PROCEDURES  Procedure(s) performed (including Critical Care):  .1-3 Lead EKG Interpretation  Date/Time: 02/02/2021 1:24 AM Performed by: Paulette Blanch, MD Authorized by: Paulette Blanch, MD     Interpretation: normal     ECG rate:  98   ECG rate assessment: normal     Rhythm: sinus rhythm     Ectopy: none     Conduction: normal   Comments:     Patient placed on cardiac monitor to evaluate for arrhythmias  CRITICAL CARE Performed by: Paulette Blanch   Total critical  care time: 45 minutes  Critical care time was exclusive of separately billable procedures and treating other patients.  Critical care was necessary to treat or prevent imminent or life-threatening deterioration.  Critical care was time spent personally by me on the following activities: development of treatment plan with patient and/or surrogate as well as nursing, discussions with consultants, evaluation of patient's response to treatment, examination of patient, obtaining history from patient or surrogate, ordering and performing treatments and interventions, ordering and review of laboratory  studies, ordering and review of radiographic studies, pulse oximetry and re-evaluation of patient's condition.  ____________________________________________   INITIAL IMPRESSION / ASSESSMENT AND PLAN / ED COURSE  As part of my medical decision making, I reviewed the following data within the [place] notes reviewed and incorporated, Labs reviewed, EKG interpreted, Old chart reviewed, Radiograph reviewed, Discussed with admitting physician, and Notes from prior ED visits     70 year old female with COPD presenting in respiratory distress. Differential includes, but is not limited to, viral syndrome, bronchitis including COPD exacerbation, pneumonia, reactive airway disease including asthma, CHF including exacerbation with or without pulmonary/interstitial edema, pneumothorax, ACS, thoracic trauma, and pulmonary embolism.   Will obtain cardiac panel, chest x-ray.  Patient switched to BiPAP immediately upon her arrival to the treatment room.  Administer stacked DuoNebs, 125 mg IV Solu-Medrol, 2 g IV magnesium.  Requires low-dose IV Ativan for calming.  Anticipate hospitalization.  Clinical Course as of 02/02/21 0555  Tue Feb 02, 2021  0222 Patient was still highly anxious after IV Ativan; IV Morphine given. [JS]  [id number] Patient resting more comfortably after IV morphine.  Will  discuss with hospitalist services for admission. [JS]    Clinical Course User Index [JS] Paulette Blanch, MD     ____________________________________________   FINAL CLINICAL IMPRESSION(S) / ED DIAGNOSES  Final diagnoses:  COPD exacerbation [facility])  Respiratory distress     ED Discharge Orders     None        Note:  This document was prepared using Dragon voice recognition software and may include unintentional dictation errors.    Paulette Blanch, MD 02/02/21 [id number]

## 2021-02-02 NOTE — ED Notes (Signed)
[place] RN aware of assigned bed 

## 2021-02-02 NOTE — ED Notes (Signed)
Patient in and out cath per [facility], MD. 1150 mL of urine drained from bladder. Patient states she feels relief at this time. Patient given new brief and purewick.

## 2021-02-02 NOTE — ED Notes (Signed)
Patient eating breakfast at this time.

## 2021-02-02 NOTE — ED Notes (Signed)
Patient able to urinate without difficulties at this time. Purewick in place.

## 2021-02-02 NOTE — ED Notes (Signed)
Message sent to [facility], MD at this time. Patient complained of being unable to urinate. Bladder scan showed 808 mL in bladder.

## 2021-02-02 NOTE — ED Notes (Signed)
Lunch tray placed at bedside. 

## 2021-02-02 NOTE — ED Notes (Signed)
Patient resting comfortably.  Family at bedside.

## 2021-02-02 NOTE — ED Notes (Signed)
Patient repositioned in bed at this time. No signs of distress.

## 2021-02-02 NOTE — ED Triage Notes (Signed)
Pt started feeling bad esarlier today took regular albuterol neb tx. Last home tx was when ems arrived.  Ems also gave albuterol tx.  Wears c-collar from previous accident years ago.

## 2021-02-02 NOTE — ED Notes (Signed)
Dinner tray at bedside

## 2021-02-02 NOTE — Progress Notes (Addendum)
No charge progress note.  Terri Burns is a 70 y.o. Caucasian female with medical history significant for COPD, depression, GERD, hypertension and dyslipidemia, who presented to the ER with acute onset of worsening dyspnea, admitted for COPD exacerbation. Initial ABG with hypercarbia which improved with BiPAP.  Now on room air.  Repeat ABG this morning was normal.  Patient was having some difficulty with voiding and required 1 time in and out catheter with bladder scan showing more than 800 cc was having some lower abdominal discomfort.  Continue current management. Encouraged voiding in the commode. Might need Foley catheter placement if unable to void. Monitor post voidal volume. -Check UA.  And procalcitonin -Currently on ceftriaxone by the admitting provider-can be discontinued if UA looks okay and procalcitonin is negative

## 2021-02-02 NOTE — ED Notes (Signed)
Pt completed 1/2 of her dinner tray.

## 2021-02-02 NOTE — ED Notes (Signed)
Patient resting comfortably at this time. Respirations even and unlabored with no signs of distress. No needs expressed.

## 2021-02-03 ENCOUNTER — Other Ambulatory Visit: Payer: Self-pay

## 2021-02-03 ENCOUNTER — Encounter: Payer: Self-pay | Admitting: Internal Medicine

## 2021-02-03 MED ORDER — IPRATROPIUM-ALBUTEROL 0.5-2.5 (3) MG/3ML IN SOLN: 3.0000 mL | Freq: Two times a day (BID) | RESPIRATORY_TRACT | Status: DC

## 2021-02-03 MED ORDER — IPRATROPIUM-ALBUTEROL 0.5-2.5 (3) MG/3ML IN SOLN
3.0000 mL | RESPIRATORY_TRACT | Status: DC
Start: 2021-02-03 — End: 2021-02-06
  Administered 2021-02-03 – 2021-02-06 (×8): 3 mL via RESPIRATORY_TRACT
  Filled 2021-02-03 (×8): qty 3

## 2021-02-03 MED ORDER — IPRATROPIUM-ALBUTEROL 0.5-2.5 (3) MG/3ML IN SOLN: 3.0000 mL | RESPIRATORY_TRACT | Status: DC

## 2021-02-03 NOTE — Progress Notes (Signed)
PROGRESS NOTE    Terri Burns  [id number] DOB: [birth year] DOA: 02/02/2021 PCP: Steele Sizer, MD  101A/101A-AA   Assessment & Plan:   Active Problems:   COPD exacerbation ([place])   Terri Burns is a 70 y.o. Caucasian female with medical history significant for COPD, depression, GERD, hypertension and dyslipidemia, who presented to the ER with acute onset of worsening dyspnea with severe respiratory distress tripoding and having associated retractions in the ER.  She admitted to recent dry cough and wheezing with her dyspnea as well as chills without measured fever.    COPD acute exacerbation --on presentation, Pt was noted to be in respiratory distress, and was put on BiPAP, however, I can not find documentation of hypoxia.  Pt was on room air this morning. --received 3 DuoNeb's, 2 g of IV magnesium sulfate, 125 mg IV Solu-Medrol in the ED. Plan: --transition to prednisone 40 mg daily today --increase DuoNeb to q4h while awake --d/c ceftriaxone --chest PT  Acute hypercapnic respiratory failure --ABG with pCO2 60, improved to 44 after BiPAP use.  2.  Hypertensive urgency. - BP varied widely --cont amlodipine, losartan and Toprol  3.  Dyslipidemia. --cont statin  4.  Major depression. - We will continue Celexa.  5.  Peripheral neuropathy. - We will continue Lyrica.  6.  GERD - We will continue PPI therapy.   Urinary retention --1 episode, I/O cath with 1150 ml out. --monitor for retention    DVT prophylaxis: Lovenox SQ Code Status: Full code  Family Communication:  Level of care: Med-Surg Dispo:   The patient is from: home Anticipated d/c is to: home Anticipated d/c date is: 2-3 days Patient currently is not medically ready to d/c due to: significant dyspnea   Subjective and Interval History:  Pt reported breathing improved but still has significant DOE.  Has chronic neck pain.  Normal oral intake.   Objective: Vitals:   02/03/21 0602 02/03/21  0715 02/03/21 0734 02/03/21 1536  BP: (!) 177/102  (!) 175/104 (!) 145/85  Pulse: 79  84 68  Resp:   18 18  Temp: 97.7 F (36.5 C)  98.1 F (36.7 C) 98.3 F (36.8 C)  TempSrc: Oral  Oral Oral  SpO2: 97% 95% 98% 98%  Weight:      Height:        Intake/Output Summary (Last 24 hours) at 02/03/2021 1848 Last data filed at 02/03/2021 1400 Gross per 24 hour  Intake 960 ml  Output 1500 ml  Net -540 ml   Filed Weights   02/02/21 0121 02/02/21 2312  Weight: 66 kg 64.6 kg    Examination:   Constitutional: NAD, AAOx3 HEENT: conjunctivae and lids normal, EOMI, neck brace on CV: No cyanosis.   RESP: gurgling breath sounds, diffuse wheezes, on RA Extremities: No effusions, edema in BLE SKIN: warm, dry Neuro: II - XII grossly intact.     Data Reviewed: I have personally reviewed following labs and imaging studies  CBC: Recent Labs  Lab 02/02/21 0125 02/02/21 0633  WBC 13.1* 11.6*  NEUTROABS 5.9  --   HGB 13.1 12.7  HCT 38.5 38.9  MCV 89.3 88.4  PLT 320 [id number]   Basic Metabolic Panel: Recent Labs  Lab 02/02/21 0125 02/02/21 0633  NA 133* 134*  K 4.1 4.3  CL 99 98  CO2 25 25  GLUCOSE 105* 167*  BUN 16 20  CREATININE 0.89 0.85  CALCIUM 8.4* 7.9*   GFR: Estimated Creatinine Clearance: 53.7 mL/min (by C-G  formula based on SCr of 0.85 mg/dL). Liver Function Tests: Recent Labs  Lab 02/02/21 0125  AST 41  ALT 27  ALKPHOS 66  BILITOT 0.3  PROT 7.8  ALBUMIN 4.2   No results for input(s): LIPASE, AMYLASE in the last 168 hours. No results for input(s): AMMONIA in the last 168 hours. Coagulation Profile: No results for input(s): INR, PROTIME in the last 168 hours. Cardiac Enzymes: No results for input(s): CKTOTAL, CKMB, CKMBINDEX, TROPONINI in the last 168 hours. BNP (last 3 results) No results for input(s): PROBNP in the last 8760 hours. HbA1C: No results for input(s): HGBA1C in the last 72 hours. CBG: No results for input(s): GLUCAP in the last 168  hours. Lipid Profile: No results for input(s): CHOL, HDL, LDLCALC, TRIG, CHOLHDL, LDLDIRECT in the last 72 hours. Thyroid Function Tests: No results for input(s): TSH, T4TOTAL, FREET4, T3FREE, THYROIDAB in the last 72 hours. Anemia Panel: No results for input(s): VITAMINB12, FOLATE, FERRITIN, TIBC, IRON, RETICCTPCT in the last 72 hours. Sepsis Labs: Recent Labs  Lab 02/02/21 1507  PROCALCITON <0.10    Recent Results (from the past 240 hour(s))  Resp Panel by RT-PCR (Flu A&B, Covid) Nasopharyngeal Swab     Status: None   Collection Time: 02/02/21  1:25 AM   Specimen: Nasopharyngeal Swab; Nasopharyngeal(NP) swabs in vial transport medium  Result Value Ref Range Status   SARS Coronavirus 2 by RT PCR NEGATIVE NEGATIVE Final    Comment: (NOTE) SARS-CoV-2 target nucleic acids are NOT DETECTED.  The SARS-CoV-2 RNA is generally detectable in upper respiratory specimens during the acute phase of infection. The lowest concentration of SARS-CoV-2 viral copies this assay can detect is 138 copies/mL. A negative result does not preclude SARS-Cov-2 infection and should not be used as the sole basis for treatment or other patient management decisions. A negative result may occur with  improper specimen collection/handling, submission of specimen other than nasopharyngeal swab, presence of viral mutation(s) within the areas targeted by this assay, and inadequate number of viral copies(<138 copies/mL). A negative result must be combined with clinical observations, patient history, and epidemiological information. The expected result is Negative.  Fact Sheet for Patients:  EntrepreneurPulse.com.au  Fact Sheet for Healthcare Providers:  IncredibleEmployment.be  This test is no t yet approved or cleared by the [place] FDA and  has been authorized for detection and/or diagnosis of SARS-CoV-2 by FDA under an Emergency Use Authorization (EUA). This EUA  will remain  in effect (meaning this test can be used) for the duration of the COVID-19 declaration under Section 564(b)(1) of the Act, 21 U.S.C.section 360bbb-3(b)(1), unless the authorization is terminated  or revoked sooner.       Influenza A by PCR NEGATIVE NEGATIVE Final   Influenza B by PCR NEGATIVE NEGATIVE Final    Comment: (NOTE) The Xpert Xpress SARS-CoV-2/FLU/RSV plus assay is intended as an aid in the diagnosis of influenza from Nasopharyngeal swab specimens and should not be used as a sole basis for treatment. Nasal washings and aspirates are unacceptable for Xpert Xpress SARS-CoV-2/FLU/RSV testing.  Fact Sheet for Patients: EntrepreneurPulse.com.au  Fact Sheet for Healthcare Providers: IncredibleEmployment.be  This test is not yet approved or cleared by the [place] FDA and has been authorized for detection and/or diagnosis of SARS-CoV-2 by FDA under an Emergency Use Authorization (EUA). This EUA will remain in effect (meaning this test can be used) for the duration of the COVID-19 declaration under Section 564(b)(1) of the Act, 21 U.S.C. section 360bbb-3(b)(1), unless the  authorization is terminated or revoked.  Performed at [facility], [address], [place], [place] [address]       Radiology Studies: [facility] Chest [place] 1 View  Result Date: 02/02/2021 CLINICAL DATA:  Shortness of breath. EXAM: PORTABLE CHEST 1 VIEW COMPARISON:  September 13, 2020 FINDINGS: Stable, diffuse chronic increased lung markings are seen. Very mild, stable scarring and/or atelectasis is seen within the lateral aspect of the left lung base. There is no evidence of a pleural effusion or pneumothorax. The heart size and mediastinal contours are within normal limits. Radiopaque surgical pins are seen within the left humeral head. IMPRESSION: Chronic increased lung markings without acute or active cardiopulmonary disease. Electronically Signed    By: Virgina Norfolk M.D.   On: 02/02/2021 01:51     Scheduled Meds:  amLODipine  5 mg Oral Daily   aspirin EC  81 mg Oral Daily   atorvastatin  20 mg Oral Daily   citalopram  20 mg Oral QPM   enoxaparin (LOVENOX) injection  40 mg Subcutaneous Q24H   fluticasone  2 spray Each Nare Daily   guaiFENesin  600 mg Oral BID   ipratropium-albuterol  3 mL Nebulization Q4H while awake   loratadine  10 mg Oral Daily   losartan  100 mg Oral QPM   metoprolol succinate  25 mg Oral QPM   pantoprazole  40 mg Oral QPM   predniSONE  40 mg Oral Q breakfast   Continuous Infusions:   LOS: 1 day     Enzo Bi, MD [facility] If 7PM-7AM, please contact night-coverage 02/03/2021, 6:48 PM

## 2021-02-03 NOTE — Plan of Care (Signed)
Will improve respiratory status without symptoms of infection.

## 2021-02-04 ENCOUNTER — Inpatient Hospital Stay: Payer: Medicare Other

## 2021-02-04 LAB — BASIC METABOLIC PANEL
Anion gap: 8 (ref 5–15)
BUN: 17 mg/dL (ref 8–23)
CO2: 28 mmol/L (ref 22–32)
Calcium: 8.7 mg/dL — ABNORMAL LOW (ref 8.9–10.3)
Chloride: 101 mmol/L (ref 98–111)
Creatinine, Ser: 0.72 mg/dL (ref 0.44–1.00)
GFR, Estimated: >60 mL/min (ref >60)
Glucose, Bld: 96 mg/dL (ref 70–99)
Potassium: 4.6 mmol/L (ref 3.5–5.1)
Sodium: 137 mmol/L (ref 135–145)

## 2021-02-04 LAB — CBC
HCT: 37.1 % (ref 36.0–46.0)
Hemoglobin: 12.3 g/dL (ref 12.0–15.0)
MCH: 29.9 pg (ref 26.0–34.0)
MCHC: 33.2 g/dL (ref 30.0–36.0)
MCV: 90 fL (ref 80.0–100.0)
Platelets: 247 10*3/uL (ref 150–400)
RBC: 4.12 MIL/uL (ref 3.87–5.11)
RDW: 14.4 % (ref 11.5–15.5)
WBC: 10.4 10*3/uL (ref 4.0–10.5)
nRBC: 0 % (ref 0.0–0.2)

## 2021-02-04 LAB — MAGNESIUM: Magnesium: 2.3 mg/dL (ref 1.7–2.4)

## 2021-02-04 MED ORDER — FLUTICASONE FUROATE-VILANTEROL 100-25 MCG/INH IN AEPB
1.0000 | INHALATION_SPRAY | Freq: Every day | RESPIRATORY_TRACT | Status: DC
  Administered 2021-02-04 – 2021-02-06 (×3): 1 via RESPIRATORY_TRACT
  Filled 2021-02-04: qty 28

## 2021-02-04 MED ORDER — TRAMADOL HCL 50 MG PO TABS
50.0000 mg | ORAL_TABLET | Freq: Two times a day (BID) | ORAL | Status: DC | PRN
  Administered 2021-02-04 – 2021-02-06 (×4): 50 mg via ORAL
  Filled 2021-02-04 (×4): qty 1

## 2021-02-04 MED ORDER — UMECLIDINIUM BROMIDE 62.5 MCG/INH IN AEPB
1.0000 | INHALATION_SPRAY | Freq: Every day | RESPIRATORY_TRACT | Status: DC
  Administered 2021-02-04 – 2021-02-06 (×3): 1 via RESPIRATORY_TRACT
  Filled 2021-02-04: qty 7

## 2021-02-04 MED ORDER — FLUTICASONE-UMECLIDIN-VILANT 100-62.5-25 MCG/INH IN AEPB: 1.0000 | INHALATION_SPRAY | Freq: Every day | RESPIRATORY_TRACT | Status: DC

## 2021-02-04 NOTE — Progress Notes (Signed)
PROGRESS NOTE    Terri Burns  [id number] DOB: 03/30/1951 DOA: 02/02/2021 PCP: Steele Sizer, MD  101A/101A-AA   Assessment & Plan:   Active Problems:   COPD exacerbation ([place])   Terri Burns is a 70 y.o. Caucasian female with medical history significant for COPD, depression, GERD, hypertension and dyslipidemia, who presented to the ER with acute onset of worsening dyspnea with severe respiratory distress tripoding and having associated retractions in the ER.  She admitted to recent dry cough and wheezing with her dyspnea as well as chills without measured fever.    COPD acute exacerbation --on presentation, Pt was noted to be in respiratory distress, and was put on BiPAP, however, I can not find documentation of hypoxia.  Pt was on room air this morning. --received 3 DuoNeb's, 2 g of IV magnesium sulfate, 125 mg IV Solu-Medrol in the ED. Plan: --cont prednisone 40 mg daily --cont DuoNeb q4h while awake --chest PT --resume home Trelegy as Breo and Incruse  Acute hypercapnic respiratory failure --ABG with pCO2 60, improved to 44 after BiPAP use.  2.  Hypertensive urgency. - BP varied widely --cont amlodipine, losartan and Toprol  3.  Dyslipidemia. --cont statin  4.  Major depression. --cont celexa  5.  Peripheral neuropathy. --pt reported no taking Lyrica  6.  GERD --cont PPI   Urinary retention --1 episode, I/O cath with 1150 ml out. --monitor for retention   Hx of type II odontoid fracture Chronic neck pain --Dx in March 2020, seen by Dr. Izora Ribas.  Per note, was not healing, so surgical intervention was offered, but pt declined.  Pt has been wearing cervical collar for the past 2 years. Plan: --messaged Dr. Izora Ribas, who will see pt --xray of cervical spine --tramadol PRN   DVT prophylaxis: Lovenox SQ Code Status: Full code  Family Communication:  Level of care: Med-Surg Dispo:   The patient is from: home Anticipated d/c is to:  home Anticipated d/c date is: 1-2 days Patient currently is not medically ready to d/c due to: significant dyspnea   Subjective and Interval History:  Pt reported breathing improved.  Sputum more clear.  Pt complained of neck pain after exertion.   Objective: Vitals:   02/03/21 2350 02/04/21 0804 02/04/21 0903 02/04/21 1200  BP: (!) 149/99  (!) 142/90   Pulse: 67  64   Resp: 18  20   Temp: 97.8 F (36.6 C)  98.1 F (36.7 C)   TempSrc: Oral     SpO2: 97% 98% 97% 99%  Weight:      Height:        Intake/Output Summary (Last 24 hours) at 02/04/2021 1611 Last data filed at 02/04/2021 1412 Gross per 24 hour  Intake 720 ml  Output 3025 ml  Net -2305 ml   Filed Weights   02/02/21 0121 02/02/21 2312  Weight: 66 kg 64.6 kg    Examination:   Constitutional: NAD, AAOx3 HEENT: conjunctivae and lids normal, EOMI, in cervical collar CV: No cyanosis.   RESP: poor effort, diffuse wheezes, on RA Extremities: No effusions, edema in BLE SKIN: warm, dry Neuro: II - XII grossly intact.   Psych: depressed mood and affect.  Appropriate judgement and reason   Data Reviewed: I have personally reviewed following labs and imaging studies  CBC: Recent Labs  Lab 02/02/21 0125 02/02/21 0633 02/04/21 0513  WBC 13.1* 11.6* 10.4  NEUTROABS 5.9  --   --   HGB 13.1 12.7 12.3  HCT 38.5 38.9  37.1  MCV 89.3 88.4 90.0  PLT 320 274 [id number]   Basic Metabolic Panel: Recent Labs  Lab 02/02/21 0125 02/02/21 0633 02/04/21 0513  NA 133* 134* 137  K 4.1 4.3 4.6  CL 99 98 101  CO2 '25 25 28  '$ GLUCOSE 105* 167* 96  BUN '16 20 17  '$ CREATININE 0.89 0.85 0.72  CALCIUM 8.4* 7.9* 8.7*  MG  --   --  2.3   GFR: Estimated Creatinine Clearance: 57.1 mL/min (by C-G formula based on SCr of 0.72 mg/dL). Liver Function Tests: Recent Labs  Lab 02/02/21 0125  AST 41  ALT 27  ALKPHOS 66  BILITOT 0.3  PROT 7.8  ALBUMIN 4.2   No results for input(s): LIPASE, AMYLASE in the last 168 hours. No results for  input(s): AMMONIA in the last 168 hours. Coagulation Profile: No results for input(s): INR, PROTIME in the last 168 hours. Cardiac Enzymes: No results for input(s): CKTOTAL, CKMB, CKMBINDEX, TROPONINI in the last 168 hours. BNP (last 3 results) No results for input(s): PROBNP in the last 8760 hours. HbA1C: No results for input(s): HGBA1C in the last 72 hours. CBG: No results for input(s): GLUCAP in the last 168 hours. Lipid Profile: No results for input(s): CHOL, HDL, LDLCALC, TRIG, CHOLHDL, LDLDIRECT in the last 72 hours. Thyroid Function Tests: No results for input(s): TSH, T4TOTAL, FREET4, T3FREE, THYROIDAB in the last 72 hours. Anemia Panel: No results for input(s): VITAMINB12, FOLATE, FERRITIN, TIBC, IRON, RETICCTPCT in the last 72 hours. Sepsis Labs: Recent Labs  Lab 02/02/21 1507  PROCALCITON <0.10    Recent Results (from the past 240 hour(s))  Resp Panel by RT-PCR (Flu A&B, Covid) Nasopharyngeal Swab     Status: None   Collection Time: 02/02/21  1:25 AM   Specimen: Nasopharyngeal Swab; Nasopharyngeal(NP) swabs in vial transport medium  Result Value Ref Range Status   SARS Coronavirus 2 by RT PCR NEGATIVE NEGATIVE Final    Comment: (NOTE) SARS-CoV-2 target nucleic acids are NOT DETECTED.  The SARS-CoV-2 RNA is generally detectable in upper respiratory specimens during the acute phase of infection. The lowest concentration of SARS-CoV-2 viral copies this assay can detect is 138 copies/mL. A negative result does not preclude SARS-Cov-2 infection and should not be used as the sole basis for treatment or other patient management decisions. A negative result may occur with  improper specimen collection/handling, submission of specimen other than nasopharyngeal swab, presence of viral mutation(s) within the areas targeted by this assay, and inadequate number of viral copies(<138 copies/mL). A negative result must be combined with clinical observations, patient history, and  epidemiological information. The expected result is Negative.  Fact Sheet for Patients:  EntrepreneurPulse.com.au  Fact Sheet for Healthcare Providers:  IncredibleEmployment.be  This test is no t yet approved or cleared by the [place] FDA and  has been authorized for detection and/or diagnosis of SARS-CoV-2 by FDA under an Emergency Use Authorization (EUA). This EUA will remain  in effect (meaning this test can be used) for the duration of the COVID-19 declaration under Section 564(b)(1) of the Act, 21 U.S.C.section 360bbb-3(b)(1), unless the authorization is terminated  or revoked sooner.       Influenza A by PCR NEGATIVE NEGATIVE Final   Influenza B by PCR NEGATIVE NEGATIVE Final    Comment: (NOTE) The Xpert Xpress SARS-CoV-2/FLU/RSV plus assay is intended as an aid in the diagnosis of influenza from Nasopharyngeal swab specimens and should not be used as a sole basis for treatment. Nasal washings  and aspirates are unacceptable for Xpert Xpress SARS-CoV-2/FLU/RSV testing.  Fact Sheet for Patients: EntrepreneurPulse.com.au  Fact Sheet for Healthcare Providers: IncredibleEmployment.be  This test is not yet approved or cleared by the [place] FDA and has been authorized for detection and/or diagnosis of SARS-CoV-2 by FDA under an Emergency Use Authorization (EUA). This EUA will remain in effect (meaning this test can be used) for the duration of the COVID-19 declaration under Section 564(b)(1) of the Act, 21 U.S.C. section 360bbb-3(b)(1), unless the authorization is terminated or revoked.  Performed at [name], [address], [place], [place] [address]       Radiology Studies: No results found.   Scheduled Meds:  amLODipine  5 mg Oral Daily   aspirin EC  81 mg Oral Daily   atorvastatin  20 mg Oral Daily   citalopram  20 mg Oral QPM   enoxaparin (LOVENOX) injection   40 mg Subcutaneous Q24H   fluticasone  2 spray Each Nare Daily   fluticasone furoate-vilanterol  1 puff Inhalation Daily   And   umeclidinium bromide  1 puff Inhalation Daily   guaiFENesin  600 mg Oral BID   ipratropium-albuterol  3 mL Nebulization Q4H while awake   loratadine  10 mg Oral Daily   losartan  100 mg Oral QPM   metoprolol succinate  25 mg Oral QPM   pantoprazole  40 mg Oral QPM   predniSONE  40 mg Oral Q breakfast   Continuous Infusions:   LOS: 2 days     Enzo Bi, MD [facility] If 7PM-7AM, please contact night-coverage 02/04/2021, 4:11 PM

## 2021-02-04 NOTE — Progress Notes (Signed)
Pt. Taken by transport to radiology for imaging ordered per MD

## 2021-02-04 NOTE — Evaluation (Signed)
Physical Therapy Evaluation Patient Details Name: Terri Burns MRN: [id number] DOB: Oct 10, 1950 Today's Date: 02/04/2021   History of Present Illness  LEKSI HERTZBERG is a 70 y.o. Caucasian female with medical history significant for COPD, depression, GERD, hypertension and dyslipidemia, who presented to the ER with acute onset of worsening dyspnea with severe respiratory distress tripoding and having associated retractions in the ER.  She admitted to recent dry cough and wheezing with her dyspnea as well as chills without measured fever  Clinical Impression  Pt is a pleasant 70 year old female who was admitted for COPD exacerbation and  concerns of dyspnea and respiratory distress. Pt performs bed mobility at modified independent level, transfers with SPV, and ambulation with CGA/SBA for 100 ft with RW. Pt received with C-collar donned and remained on throughout session. SpO2 monitored throughout session with oxygen sat >93% throughout and reporting 3/10 dyspnea with ambulation. Pt demonstrates deficits with B LE strength, impaired balance, and impaired activity tolerance. Pt reports living alone in a level entry apartment and reports being independent prior to admission with usage of rollator for community distance ambulation. Pt reports having a son that lives in the area who would be able to provide intermittent SPV. Pt recommending HHPT to improve balance and reduce fall risk and patient agreeable. Pt will benefit from skilled PT services to improve deficits listed above for safe discharge disposition.      Follow Up Recommendations Home health PT;Supervision - Intermittent    Equipment Recommendations       Recommendations for Other Services       Precautions / Restrictions Precautions Precautions: Fall Restrictions Weight Bearing Restrictions: No      Mobility  Bed Mobility Overal bed mobility: Modified Independent      General bed mobility comments: Requires additional time and  usage of bed rails to assist her to EOB.    Transfers Overall transfer level: Needs assistance Equipment used: Rolling walker (2 wheeled) Transfers: Sit to/from Stand Sit to Stand: Supervision      General transfer comment: SPV for transfer for patient safety with RW. Denies dizziness and no SOB with initial standing.  Ambulation/Gait Ambulation/Gait assistance: Min guard Gait Distance (Feet): 100 Feet Assistive device: Rolling walker (2 wheeled) Gait Pattern/deviations: Step-through pattern;Decreased step length - right;Decreased step length - left;Narrow base of support;Trunk flexed     General Gait Details: Pt ambulating with RW with increased forward flexed posture and dyspnea rating 2/10 at end of ambulation trial. Pt demonstrating good safety awareness with usage of AD.  Stairs     Wheelchair Mobility    Modified Rankin (Stroke Patients Only)     Balance Overall balance assessment: Needs assistance Sitting-balance support: Feet supported Sitting balance-Leahy Scale: Good     Standing balance support: Bilateral upper extremity supported Standing balance-Leahy Scale: Good Standing balance comment: Pt demonstrated good safety awareness and balance with usage of AD      High Level Balance Comments: Pt requiring minA for balance with tandem stance bilaterally and delayed stepping response to maintain balance. Pt able to stand with feet together eyes open and closed with mild postural sway.        Pertinent Vitals/Pain Pain Assessment: 0-10 Pain Score: 3  Pain Location: Posterior aspect of head Pain Descriptors / Indicators: Dull;Aching Pain Intervention(s): Limited activity within patient's tolerance;Monitored during session;Premedicated before session    Home Living Family/patient expects to be discharged to:: Private residence Living Arrangements: Alone Available Help at Discharge: Family;Friend(s);Available PRN/intermittently Type  of Home: Apartment Home  Access: Level entry     Home Layout: One level Home Equipment: Walker - 4 wheels Additional Comments: Used rollator at baseline for community ambulation    Prior Function Level of Independence: Independent     Comments: Pt reports being independent with ADLs and most IADLs     Hand Dominance        Extremity/Trunk Assessment   Upper Extremity Assessment Upper Extremity Assessment: Overall WFL for tasks assessed    Lower Extremity Assessment Lower Extremity Assessment: RLE deficits/detail;LLE deficits/detail;Generalized weakness RLE Deficits / Details: Decreased R hip flexor and knee flexion strength 3+/5 MMT. Increased discomfort with R knee extension but denies pain RLE Sensation: WNL RLE Coordination: WNL LLE Deficits / Details: Decreased L hip and knee flexion strength 3+/5 MMT LLE Sensation: WNL LLE Coordination: WNL       Communication   Communication: No difficulties  Cognition Arousal/Alertness: Awake/alert Behavior During Therapy: WFL for tasks assessed/performed Overall Cognitive Status: Within Functional Limits for tasks assessed  General Comments: A&O x 4      General Comments      Exercises Other Exercises Other Exercises: Pt ambulated in hallway for 100 ft with RW and CGA for patient safety. Pt educated on usage of dyspnea scale to rate her shortness of breath with exertion. Pt reporting 3/10 SOB at end of ambulation requesting to sit. Oxygen saturation montiored throughout and >93% at all times. Pt demonstrates safety awareness with usage of RW and reports using a rollator at baseline.   Assessment/Plan    PT Assessment Patient needs continued PT services  PT Problem List Decreased strength;Decreased balance;Decreased activity tolerance       PT Treatment Interventions Gait training;Stair training;Functional mobility training;Therapeutic activities;Therapeutic exercise;Balance training;Neuromuscular re-education    PT Goals (Current goals can be  found in the Care Plan section)  Acute Rehab PT Goals Patient Stated Goal: to go home and breath better PT Goal Formulation: With patient Time For Goal Achievement: 02/14/21 Potential to Achieve Goals: Good    Frequency Min 2X/week   Barriers to discharge        Co-evaluation               AM-PAC PT "6 Clicks" Mobility  Outcome Measure Help needed turning from your back to your side while in a flat bed without using bedrails?: None Help needed moving from lying on your back to sitting on the side of a flat bed without using bedrails?: None Help needed moving to and from a bed to a chair (including a wheelchair)?: A Little Help needed standing up from a chair using your arms (e.g., wheelchair or bedside chair)?: A Little Help needed to walk in hospital room?: A Little Help needed climbing 3-5 steps with a railing? : A Lot 6 Click Score: 19    End of Session Equipment Utilized During Treatment: Gait belt Activity Tolerance: Patient tolerated treatment well;Patient limited by fatigue Patient left: in chair;with call bell/phone within reach;with chair alarm set Nurse Communication: Mobility status PT Visit Diagnosis: Unsteadiness on feet (R26.81);Muscle weakness (generalized) (M62.81);Other abnormalities of gait and mobility (R26.89)    Time: [id number] PT Time Calculation (min) (ACUTE ONLY): 29 min   Charges:   PT Evaluation $PT Eval Low Complexity: 1 Low PT Treatments $Gait Training: 8-22 mins        Andrey Campanile, SPT   Andrey Campanile 02/04/2021, 12:57 PM

## 2021-02-05 LAB — CBC
HCT: 38.3 % (ref 36.0–46.0)
Hemoglobin: 12.6 g/dL (ref 12.0–15.0)
MCH: 29.3 pg (ref 26.0–34.0)
MCHC: 32.9 g/dL (ref 30.0–36.0)
MCV: 89.1 fL (ref 80.0–100.0)
Platelets: 250 10*3/uL (ref 150–400)
RBC: 4.3 MIL/uL (ref 3.87–5.11)
RDW: 14.3 % (ref 11.5–15.5)
WBC: 10.6 10*3/uL — ABNORMAL HIGH (ref 4.0–10.5)
nRBC: 0 % (ref 0.0–0.2)

## 2021-02-05 LAB — BASIC METABOLIC PANEL
Anion gap: 9 (ref 5–15)
BUN: 22 mg/dL (ref 8–23)
CO2: 26 mmol/L (ref 22–32)
Calcium: 8.8 mg/dL — ABNORMAL LOW (ref 8.9–10.3)
Chloride: 100 mmol/L (ref 98–111)
Creatinine, Ser: 0.71 mg/dL (ref 0.44–1.00)
GFR, Estimated: >60 mL/min (ref >60)
Glucose, Bld: 85 mg/dL (ref 70–99)
Potassium: 4.2 mmol/L (ref 3.5–5.1)
Sodium: 135 mmol/L (ref 135–145)

## 2021-02-05 LAB — MAGNESIUM: Magnesium: 2.1 mg/dL (ref 1.7–2.4)

## 2021-02-05 MED ORDER — AMLODIPINE BESYLATE 10 MG PO TABS
10.0000 mg | ORAL_TABLET | Freq: Every day | ORAL | Status: DC
  Administered 2021-02-06: 10 mg via ORAL
  Filled 2021-02-05: qty 1

## 2021-02-05 NOTE — Care Management Important Message (Signed)
Important Message  Patient Details  Name: Terri Burns MRN: [id number] Date of Birth: [birth year]   Medicare Important Message Given:  Yes     Dannette Barbara 02/05/2021, 11:12 AM

## 2021-02-05 NOTE — Consult Note (Signed)
Neurosurgery-New Consultation Evaluation 02/05/2021 Terri Burns [id number]  Identifying Statement: Terri Burns is a 70 y.o. female from [place] [address]   Physician Requesting Consultation: No ref. provider found  History of Present Illness: Terri Burns is a 70 y.o with a medical history of COPD, GERD, hyperlipidemia, hypertension, and a known odontoid fracture.  She was initially seen by Dr. Cari Caraway in 2020 after hitting her forehead on a dresser and sustaining a cervical fracture.  She was placed in a cervical collar and treated conservatively however she continued to have severe neck pain.  Recommended that she undergo a C1-2 fusion and this is discussed with her however the patient wanted to discuss it with her family and was subsequently lost to follow-up. Who presented to the hospital on 02/02/2021 for shortness of breath and in respiratory distress.  She was ultimately admitted to the hospitalist for concern of a COPD exacerbation.  Ultimately neurosurgery was consulted to evaluate her cervical spine as patient has remained in a cervical collar for the past 2 years. She reports that she has been wearing her cervical collar constantly since 2020 and continues to have neck pain without significant radicular pain or focal neurologic weakness.  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    Social History: Social History   Socioeconomic History   Marital status: Widowed    Spouse name: Not on file   Number of children: 3   Years of education: Not on file   Highest education level: Some college, no degree  Occupational History   Occupation: disabled      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   Her daughter died due to heart issues on 11/08/18 at the age of 26.   Both sons live in town and are helping her out    Social Determinants of Radio broadcast assistant Strain: Low Risk    Difficulty of Paying Living Expenses: Not hard at all  Food Insecurity: No Food Insecurity   Worried About Charity fundraiser in the Last Year: Never true   Arboriculturist in the Last Year: Never true  Transportation Needs: No Transportation Needs   Lack of Transportation (Medical): No   Lack of Transportation (Non-Medical): No  Physical Activity: Inactive   Days of Exercise per Week: 0 days   Minutes of Exercise per Session: 0 min  Stress:  No Stress Concern Present   Feeling of Stress : Not at all  Social Connections: Moderately Integrated   Frequency of Communication with Friends and Family: More than three times a week   Frequency of Social Gatherings with Friends and Family: More than three times a week   Attends Religious Services: More than 4 times per year   Active Member of [employer] or Organizations: Yes   Attends Archivist Meetings: More than 4 times per year   Marital Status: Widowed  Human resources officer Violence: Not At Risk   Fear of Current or Ex-Partner: No   Emotionally Abused: No   Physically Abused: No   Sexually Abused: No    Family History: Family History   Problem Relation Age of Onset   Hypertension Mother    Stroke Mother    Heart disease Father    Diabetes Father    Heart disease Daughter    Pulmonary embolism Daughter    Hypertension Son     Review of Systems:  Review of Systems - General ROS: Negative Psychological ROS: Negative Ophthalmic ROS: Negative ENT ROS: Negative Hematological and Lymphatic ROS: Negative  Endocrine ROS: Negative Respiratory ROS: Negative Cardiovascular ROS: Negative Gastrointestinal ROS: Negative Genito-Urinary ROS: Negative Musculoskeletal ROS: Negative Neurological ROS: Negative Dermatological ROS: Negative  Physical Exam: BP (!) 162/99 (BP Location: Left Arm)   Pulse 64   Temp 98.2 F (36.8 C) (Oral)   Resp 17   Ht '5\' 1"'$  (1.549 m)   Wt 64.6 kg   SpO2 100%   BMI 26.91 kg/m  Body mass index is 26.91 kg/m. Body surface area is 1.67 meters squared. General appearance: Alert, cooperative, in no acute distress Head: Normocephalic, atraumatic Eyes: Normal, EOM intact Oropharynx: Moist without lesions Neck: Supple Abdomen: Soft, nondistended Ext: No edema in LE bilaterally, good distal pulses  Neurologic exam:  Mental status: alertness: alert, orientation: person, place, time, affect: normal Speech: fluent and clear Cranial nerves:  CN II-XII grossly intact Motor:strength symmetric 5/5, normal muscle mass and tone in all extremities and no pronator drift Sensory: intact to light touch in all extremities Reflexes: 2+ and symmetric bilaterally for arms and legs Coordination: intact finger to nose Gait: not assessed.   Laboratory: Results for orders placed or performed during the hospital encounter of 02/02/21  Resp Panel by RT-PCR (Flu A&B, Covid) Nasopharyngeal Swab   Specimen: Nasopharyngeal Swab; Nasopharyngeal(NP) swabs in vial transport medium  Result Value Ref Range   SARS Coronavirus 2 by RT PCR NEGATIVE NEGATIVE   Influenza A by PCR NEGATIVE NEGATIVE   Influenza B by PCR  NEGATIVE NEGATIVE  CBC with Differential  Result Value Ref Range   WBC 13.1 (H) 4.0 - 10.5 K/uL   RBC 4.31 3.87 - 5.11 MIL/uL   Hemoglobin 13.1 12.0 - 15.0 g/dL   HCT 38.5 36.0 - 46.0 %   MCV 89.3 80.0 - 100.0 fL   MCH 30.4 26.0 - 34.0 pg   MCHC 34.0 30.0 - 36.0 g/dL   RDW 13.9 11.5 - 15.5 %   Platelets 320 150 - 400 K/uL   nRBC 0.0 0.0 - 0.2 %   Neutrophils Relative % 43 %   Neutro Abs 5.9 1.7 - 7.7 K/uL   Lymphocytes Relative 42 %   Lymphs Abs 5.5 (H) 0.7 - 4.0 K/uL   Monocytes Relative 8 %   Monocytes Absolute 1.0 0.1 - 1.0 K/uL   Eosinophils Relative 4 %   Eosinophils Absolute 0.5 0.0 - 0.5  K/uL   Basophils Relative 1 %   Basophils Absolute 0.1 0.0 - 0.1 K/uL   Immature Granulocytes 2 %   Abs Immature Granulocytes 0.20 (H) 0.00 - 0.07 K/uL  Comprehensive metabolic panel  Result Value Ref Range   Sodium 133 (L) 135 - 145 mmol/L   Potassium 4.1 3.5 - 5.1 mmol/L   Chloride 99 98 - 111 mmol/L   CO2 25 22 - 32 mmol/L   Glucose, Bld 105 (H) 70 - 99 mg/dL   BUN 16 8 - 23 mg/dL   Creatinine, Ser 0.89 0.44 - 1.00 mg/dL   Calcium 8.4 (L) 8.9 - 10.3 mg/dL   Total Protein 7.8 6.5 - 8.1 g/dL   Albumin 4.2 3.5 - 5.0 g/dL   AST 41 15 - 41 U/L   ALT 27 0 - 44 U/L   Alkaline Phosphatase 66 38 - 126 U/L   Total Bilirubin 0.3 0.3 - 1.2 mg/dL   GFR, Estimated >60 >60 mL/min   Anion gap 9 5 - 15  Brain natriuretic peptide  Result Value Ref Range   B Natriuretic Peptide 283.2 (H) 0.0 - 100.0 pg/mL  Blood gas, arterial  Result Value Ref Range   FIO2 0.50    Delivery systems BILEVEL POSITIVE AIRWAY PRESSURE    Inspiratory PAP 10    Expiratory PAP 5    pH, Arterial 7.24 (L) 7.350 - 7.450   pCO2 arterial 60 (H) 32.0 - 48.0 mmHg   pO2, Arterial 219 (H) 83.0 - 108.0 mmHg   Bicarbonate 25.7 20.0 - 28.0 mmol/L   Acid-base deficit 2.7 (H) 0.0 - 2.0 mmol/L   O2 Saturation 99.7 %   Patient temperature 37.0    Collection site LEFT RADIAL    Sample type ARTERIAL DRAW    Allens test  (pass/fail) PASS PASS  Basic metabolic panel  Result Value Ref Range   Sodium 134 (L) 135 - 145 mmol/L   Potassium 4.3 3.5 - 5.1 mmol/L   Chloride 98 98 - 111 mmol/L   CO2 25 22 - 32 mmol/L   Glucose, Bld 167 (H) 70 - 99 mg/dL   BUN 20 8 - 23 mg/dL   Creatinine, Ser 0.85 0.44 - 1.00 mg/dL   Calcium 7.9 (L) 8.9 - 10.3 mg/dL   GFR, Estimated >60 >60 mL/min   Anion gap 11 5 - 15  CBC  Result Value Ref Range   WBC 11.6 (H) 4.0 - 10.5 K/uL   RBC 4.40 3.87 - 5.11 MIL/uL   Hemoglobin 12.7 12.0 - 15.0 g/dL   HCT 38.9 36.0 - 46.0 %   MCV 88.4 80.0 - 100.0 fL   MCH 28.9 26.0 - 34.0 pg   MCHC 32.6 30.0 - 36.0 g/dL   RDW 14.1 11.5 - 15.5 %   Platelets 274 150 - 400 K/uL   nRBC 0.0 0.0 - 0.2 %  Blood gas, arterial  Result Value Ref Range   FIO2 28.00    Delivery systems BILEVEL POSITIVE AIRWAY PRESSURE    Inspiratory PAP 14.0    Expiratory PAP 5.0    pH, Arterial 7.36 7.350 - 7.450   pCO2 arterial 44 32.0 - 48.0 mmHg   pO2, Arterial 101 83.0 - 108.0 mmHg   Bicarbonate 24.9 20.0 - 28.0 mmol/L   Acid-base deficit 0.8 0.0 - 2.0 mmol/L   O2 Saturation 97.6 %   Patient temperature 37.0    Collection site LEFT RADIAL    Sample type ARTERIAL DRAW  Allens test (pass/fail) PASS PASS  Urinalysis, Complete w Microscopic  Result Value Ref Range   Color, Urine STRAW (A) YELLOW   APPearance CLEAR (A) CLEAR   Specific Gravity, Urine 1.010 1.005 - 1.030   pH 6.0 5.0 - 8.0   Glucose, UA NEGATIVE NEGATIVE mg/dL   Hgb urine dipstick SMALL (A) NEGATIVE   Bilirubin Urine NEGATIVE NEGATIVE   Ketones, ur NEGATIVE NEGATIVE mg/dL   Protein, ur NEGATIVE NEGATIVE mg/dL   Nitrite NEGATIVE NEGATIVE   Leukocytes,Ua NEGATIVE NEGATIVE   RBC / HPF 0-5 0 - 5 RBC/hpf   WBC, UA NONE SEEN 0 - 5 WBC/hpf   Bacteria, UA NONE SEEN NONE SEEN   Squamous Epithelial / LPF 0-5 0 - 5   Mucus PRESENT   Procalcitonin - Baseline  Result Value Ref Range   Procalcitonin <0.10 ng/mL  Basic metabolic panel  Result  Value Ref Range   Sodium 137 135 - 145 mmol/L   Potassium 4.6 3.5 - 5.1 mmol/L   Chloride 101 98 - 111 mmol/L   CO2 28 22 - 32 mmol/L   Glucose, Bld 96 70 - 99 mg/dL   BUN 17 8 - 23 mg/dL   Creatinine, Ser 0.72 0.44 - 1.00 mg/dL   Calcium 8.7 (L) 8.9 - 10.3 mg/dL   GFR, Estimated >60 >60 mL/min   Anion gap 8 5 - 15  CBC  Result Value Ref Range   WBC 10.4 4.0 - 10.5 K/uL   RBC 4.12 3.87 - 5.11 MIL/uL   Hemoglobin 12.3 12.0 - 15.0 g/dL   HCT 37.1 36.0 - 46.0 %   MCV 90.0 80.0 - 100.0 fL   MCH 29.9 26.0 - 34.0 pg   MCHC 33.2 30.0 - 36.0 g/dL   RDW 14.4 11.5 - 15.5 %   Platelets 247 150 - 400 K/uL   nRBC 0.0 0.0 - 0.2 %  Magnesium  Result Value Ref Range   Magnesium 2.3 1.7 - 2.4 mg/dL  Basic metabolic panel  Result Value Ref Range   Sodium 135 135 - 145 mmol/L   Potassium 4.2 3.5 - 5.1 mmol/L   Chloride 100 98 - 111 mmol/L   CO2 26 22 - 32 mmol/L   Glucose, Bld 85 70 - 99 mg/dL   BUN 22 8 - 23 mg/dL   Creatinine, Ser 0.71 0.44 - 1.00 mg/dL   Calcium 8.8 (L) 8.9 - 10.3 mg/dL   GFR, Estimated >60 >60 mL/min   Anion gap 9 5 - 15  CBC  Result Value Ref Range   WBC 10.6 (H) 4.0 - 10.5 K/uL   RBC 4.30 3.87 - 5.11 MIL/uL   Hemoglobin 12.6 12.0 - 15.0 g/dL   HCT 38.3 36.0 - 46.0 %   MCV 89.1 80.0 - 100.0 fL   MCH 29.3 26.0 - 34.0 pg   MCHC 32.9 30.0 - 36.0 g/dL   RDW 14.3 11.5 - 15.5 %   Platelets 250 150 - 400 K/uL   nRBC 0.0 0.0 - 0.2 %  Magnesium  Result Value Ref Range   Magnesium 2.1 1.7 - 2.4 mg/dL  Troponin I (High Sensitivity)  Result Value Ref Range   Troponin I (High Sensitivity) 4 <18 ng/L  Troponin I (High Sensitivity)  Result Value Ref Range   Troponin I (High Sensitivity) 6 <18 ng/L    Imaging:  IMPRESSION: 1. Post fusion hardware at C5-C6. 2. Faintly visible known type 2 dens fracture but better seen on CT. No appreciable change  in alignment with flexion or extension 3. Degenerative changes  Impression/Plan:     1.  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  Plan - Ok to clear collar.  I had a long discussion with the patient about slowly weaning out of this as she may have some increased musculoskeletal neck pain due to maintaining a cervical collar for 2 years. -No plan for acute neurosurgical intervention at this time.  Cooper Render PA-C Neurosurgery

## 2021-02-05 NOTE — Progress Notes (Signed)
PROGRESS NOTE    Terri Burns  [id number] DOB: May 28, 1951 DOA: 02/02/2021 PCP: Steele Sizer, MD  101A/101A-AA   Assessment & Plan:   Active Problems:   COPD exacerbation ([place])   Terri Burns is a 70 y.o. Caucasian female with medical history significant for COPD, depression, GERD, hypertension and dyslipidemia, who presented to the ER with acute onset of worsening dyspnea with severe respiratory distress tripoding and having associated retractions in the ER.  She admitted to recent dry cough and wheezing with her dyspnea as well as chills without measured fever.    COPD acute exacerbation --on presentation, Pt was noted to be in respiratory distress, and was put on BiPAP, however, I can not find documentation of hypoxia.  Pt was on room air this morning. --received 3 DuoNeb's, 2 g of IV magnesium sulfate, 125 mg IV Solu-Medrol in the ED. Plan: --cont prednisone 40 mg daily --cont DuoNeb q4h while awake --chest PT --cont home Trelegy as Breo and Incruse  Acute hypercapnic respiratory failure --ABG with pCO2 60, improved to 44 after BiPAP use.  2.  Hypertensive urgency. - cont losartan and Toprol --increase amlodipine to '10mg'$  daily  3.  Dyslipidemia. --cont statin  4.  Major depression. --cont celexa  5.  Peripheral neuropathy. --pt reported not taking Lyrica  6.  GERD --cont PPI   Urinary retention --1 episode, I/O cath with 1150 ml out. --has been voiding fine since --monitor for retention  Hx of type II odontoid fracture Chronic neck pain --Dx in March 2020, seen by Dr. Izora Ribas.  Per note, was not healing, so surgical intervention was offered, but pt declined.  Pt has been wearing cervical collar for the past 2 years. Plan: --neurosurgery consult, cleared pt to be off cervical collar after reviewing xrays of cervical spine --will need to gradually reduce time with cervical collar since pt's neck may be weak after 2 years of wearing the collar --PT  to work with pt --tramadol PRN   DVT prophylaxis: Lovenox SQ Code Status: Full code  Family Communication:  Level of care: Med-Surg Dispo:   The patient is from: home Anticipated d/c is to: home Anticipated d/c date is: likely tomorrow Patient currently is medically ready to d/c.   Subjective and Interval History:  Pt complained of neck pain.  Neurosurgery cleared pt to taper off cervical collar.   Objective: Vitals:   02/05/21 0859 02/05/21 1150 02/05/21 1603 02/05/21 1725  BP: (!) 162/99 (!) 169/95  134/85  Pulse: 64 63  71  Resp:  20  20  Temp: 98.2 F (36.8 C) 98.4 F (36.9 C)  98.1 F (36.7 C)  TempSrc: Oral Oral    SpO2: 100% 95% 94% 97%  Weight:      Height:        Intake/Output Summary (Last 24 hours) at 02/05/2021 1756 Last data filed at 02/05/2021 1727 Gross per 24 hour  Intake 720 ml  Output 2435 ml  Net -1715 ml   Filed Weights   02/02/21 0121 02/02/21 2312  Weight: 66 kg 64.6 kg    Examination:   Constitutional: NAD, AAOx3 HEENT: conjunctivae and lids normal, EOMI, with cervical collar CV: No cyanosis.   RESP: shallow breaths, on RA Extremities: No effusions, edema in BLE SKIN: warm, dry Neuro: II - XII grossly intact.   Psych: depressed mood and affect.  Appropriate judgement and reason   Data Reviewed: I have personally reviewed following labs and imaging studies  CBC: Recent Labs  Lab 02/02/21 0125 02/02/21 0633 02/04/21 0513 02/05/21 0404  WBC 13.1* 11.6* 10.4 10.6*  NEUTROABS 5.9  --   --   --   HGB 13.1 12.7 12.3 12.6  HCT 38.5 38.9 37.1 38.3  MCV 89.3 88.4 90.0 89.1  PLT 320 274 247 [id number]   Basic Metabolic Panel: Recent Labs  Lab 02/02/21 0125 02/02/21 0633 02/04/21 0513 02/05/21 0404  NA 133* 134* 137 135  K 4.1 4.3 4.6 4.2  CL 99 98 101 100  CO2 '25 25 28 26  '$ GLUCOSE 105* 167* 96 85  BUN '16 20 17 22  '$ CREATININE 0.89 0.85 0.72 0.71  CALCIUM 8.4* 7.9* 8.7* 8.8*  MG  --   --  2.3 2.1   GFR: Estimated Creatinine  Clearance: 57.1 mL/min (by C-G formula based on SCr of 0.71 mg/dL). Liver Function Tests: Recent Labs  Lab 02/02/21 0125  AST 41  ALT 27  ALKPHOS 66  BILITOT 0.3  PROT 7.8  ALBUMIN 4.2   No results for input(s): LIPASE, AMYLASE in the last 168 hours. No results for input(s): AMMONIA in the last 168 hours. Coagulation Profile: No results for input(s): INR, PROTIME in the last 168 hours. Cardiac Enzymes: No results for input(s): CKTOTAL, CKMB, CKMBINDEX, TROPONINI in the last 168 hours. BNP (last 3 results) No results for input(s): PROBNP in the last 8760 hours. HbA1C: No results for input(s): HGBA1C in the last 72 hours. CBG: No results for input(s): GLUCAP in the last 168 hours. Lipid Profile: No results for input(s): CHOL, HDL, LDLCALC, TRIG, CHOLHDL, LDLDIRECT in the last 72 hours. Thyroid Function Tests: No results for input(s): TSH, T4TOTAL, FREET4, T3FREE, THYROIDAB in the last 72 hours. Anemia Panel: No results for input(s): VITAMINB12, FOLATE, FERRITIN, TIBC, IRON, RETICCTPCT in the last 72 hours. Sepsis Labs: Recent Labs  Lab 02/02/21 1507  PROCALCITON <0.10    Recent Results (from the past 240 hour(s))  Resp Panel by RT-PCR (Flu A&B, Covid) Nasopharyngeal Swab     Status: None   Collection Time: 02/02/21  1:25 AM   Specimen: Nasopharyngeal Swab; Nasopharyngeal(NP) swabs in vial transport medium  Result Value Ref Range Status   SARS Coronavirus 2 by RT PCR NEGATIVE NEGATIVE Final    Comment: (NOTE) SARS-CoV-2 target nucleic acids are NOT DETECTED.  The SARS-CoV-2 RNA is generally detectable in upper respiratory specimens during the acute phase of infection. The lowest concentration of SARS-CoV-2 viral copies this assay can detect is 138 copies/mL. A negative result does not preclude SARS-Cov-2 infection and should not be used as the sole basis for treatment or other patient management decisions. A negative result may occur with  improper specimen  collection/handling, submission of specimen other than nasopharyngeal swab, presence of viral mutation(s) within the areas targeted by this assay, and inadequate number of viral copies(<138 copies/mL). A negative result must be combined with clinical observations, patient history, and epidemiological information. The expected result is Negative.  Fact Sheet for Patients:  EntrepreneurPulse.com.au  Fact Sheet for Healthcare Providers:  IncredibleEmployment.be  This test is no t yet approved or cleared by the [place] FDA and  has been authorized for detection and/or diagnosis of SARS-CoV-2 by FDA under an Emergency Use Authorization (EUA). This EUA will remain  in effect (meaning this test can be used) for the duration of the COVID-19 declaration under Section 564(b)(1) of the Act, 21 U.S.C.section 360bbb-3(b)(1), unless the authorization is terminated  or revoked sooner.       Influenza A by  PCR NEGATIVE NEGATIVE Final   Influenza B by PCR NEGATIVE NEGATIVE Final    Comment: (NOTE) The Xpert Xpress SARS-CoV-2/FLU/RSV plus assay is intended as an aid in the diagnosis of influenza from Nasopharyngeal swab specimens and should not be used as a sole basis for treatment. Nasal washings and aspirates are unacceptable for Xpert Xpress SARS-CoV-2/FLU/RSV testing.  Fact Sheet for Patients: EntrepreneurPulse.com.au  Fact Sheet for Healthcare Providers: IncredibleEmployment.be  This test is not yet approved or cleared by the [place] FDA and has been authorized for detection and/or diagnosis of SARS-CoV-2 by FDA under an Emergency Use Authorization (EUA). This EUA will remain in effect (meaning this test can be used) for the duration of the COVID-19 declaration under Section 564(b)(1) of the Act, 21 U.S.C. section 360bbb-3(b)(1), unless the authorization is terminated or revoked.  Performed at [facility], [address] [place], [place] [address]       Radiology Studies: DG Cervical Spine With Flex & Extend  Result Date: 02/04/2021 CLINICAL DATA:  History of neck fracture posterior neck pain EXAM: CERVICAL SPINE COMPLETE WITH FLEXION AND EXTENSION VIEWS COMPARISON:  CT 11/08/2018 FINDINGS: Post fusion hardware C5-C6. Known C2 fracture is faintly visible. No appreciable change in alignment with flexion or extension. Remaining vertebral bodies demonstrate normal stature. Moderate degenerative change C4-C5. The lateral masses are grossly within normal limits. IMPRESSION: 1. Post fusion hardware at C5-C6. 2. Faintly visible known type 2 dens fracture but better seen on CT. No appreciable change in alignment with flexion or extension 3. Degenerative changes Electronically Signed   By: Donavan Foil M.D.   On: 02/04/2021 21:15     Scheduled Meds:  amLODipine  5 mg Oral Daily   aspirin EC  81 mg Oral Daily   atorvastatin  20 mg Oral Daily   citalopram  20 mg Oral QPM   enoxaparin (LOVENOX) injection  40 mg Subcutaneous Q24H   fluticasone  2 spray Each Nare Daily   fluticasone furoate-vilanterol  1 puff Inhalation Daily   And   umeclidinium bromide  1 puff Inhalation Daily   guaiFENesin  600 mg Oral BID   ipratropium-albuterol  3 mL Nebulization Q4H while awake   loratadine  10 mg Oral Daily   losartan  100 mg Oral QPM   metoprolol succinate  25 mg Oral QPM   pantoprazole  40 mg Oral QPM   predniSONE  40 mg Oral Q breakfast   Continuous Infusions:   LOS: 3 days     Enzo Bi, MD [facility] If 7PM-7AM, please contact night-coverage 02/05/2021, 5:56 PM

## 2021-02-05 NOTE — TOC Initial Note (Signed)
Transition of Care [facility]) - Initial/Assessment Note    Patient Details  Name: Terri Burns MRN: [id number] Date of Birth: [birth year]  [facility] CM/SW Contact:    Shelbie Hutching, RN Phone Number: 02/05/2021, 6:07 PM  Clinical Narrative:                 Patient admitted to the hospital with COPD exacerbation.  RNCM met with patient at the bedside to discuss discharge planning.  Patient is from home in [place] where she lives alone.  Patient has a walker that she uses when she goes outside.  She is independent in ADL's, she does not drive but her children or friends can provide her transportation.  Son, Harrie Jeans and pick her up at discharge.   PT is recommending home health services and patient agrees, she has used [facility] in the past and would like to use them again.   Ramond Marrow with [facility] has accepted home health referral for RN and PT.     Expected Discharge Plan: [place] Barriers to Discharge: Continued Medical Work up   Patient Goals and CMS Choice Patient states their goals for this hospitalization and ongoing recovery are:: To be breathing better and for neck pain to be under control. CMS Medicare.gov Compare Post Acute Care list provided to:: Patient Choice offered to / list presented to : Patient  Expected Discharge Plan and Services Expected Discharge Plan: [place]   Discharge Planning Services: CM Consult Post Acute Care Choice: [place] arrangements for the past 2 months: Apartment                 DME Arranged: N/A DME Agency: NA       HH Arranged: RN, PT [place] Agency: [place] ([place]) Date HH Agency Contacted: 02/05/21 Time HH Agency Contacted: 72 Representative spoke with at [place]: [place] Arrangements/Services Living arrangements for the past 2 months: [place] with:: Self Patient language and need for interpreter reviewed:: Yes Do you feel safe going  back to the place where you live?: Yes      Need for Family Participation in Patient Care: Yes (Comment) (COPD) Care giver support system in place?: Yes (comment) (son and friends) Current home services: DME (walker) Criminal Activity/Legal Involvement Pertinent to Current Situation/Hospitalization: No - Comment as needed  Activities of Daily Living Home Assistive Devices/Equipment: Blood pressure cuff, Brace (specify type), C-collar, Dentures (specify type), Grab bars around toilet, Grab bars in shower, Hand-held shower hose, Nebulizer, Shower chair with back, Environmental consultant (specify type) (roll about waler) ADL Screening (condition at time of admission) Patient's cognitive ability adequate to safely complete daily activities?: Yes Is the patient deaf or have difficulty hearing?: No Does the patient have difficulty seeing, even when wearing glasses/contacts?: No Does the patient have difficulty concentrating, remembering, or making decisions?: No Patient able to express need for assistance with ADLs?: Yes Does the patient have difficulty dressing or bathing?: No Independently performs ADLs?: Yes (appropriate for developmental age) Does the patient have difficulty walking or climbing stairs?: No Weakness of Legs: Both Weakness of Arms/Hands: Both  Permission Sought/Granted Permission sought to share information with : Case Manager, Family Supports, Other (comment) Permission granted to share information with : Yes, Verbal Permission Granted  Share Information with NAME: Harrie Jeans  Permission granted to share info w AGENCY: [facility]  Permission granted to share info w Relationship: son     Emotional Assessment Appearance::  Appears stated age Attitude/Demeanor/Rapport: Engaged Affect (typically observed): Accepting Orientation: : Oriented to Self, Oriented to Place, Oriented to  Time, Oriented to Situation Alcohol / Substance Use: Not Applicable Psych Involvement: No (comment)  Admission  diagnosis:  Respiratory distress [R06.03] COPD exacerbation ([place]) [J44.1] Patient Active Problem List   Diagnosis Date Noted   Respiratory distress    Depression 09/13/2020   COPD exacerbation ([place]) 09/13/2020   Acute respiratory failure with hypoxia ([place]) 09/13/2020   HLD (hyperlipidemia) 09/13/2020   Skin lesion of face 05/01/2018   Atherosclerosis of aorta ([place]) 03/04/2018   Coronary artery calcification of native artery 03/04/2018   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 [place] [address] Phone: [phone] Fax: [phone]  [id number] [place] [id number]Phone: [phone]: [phone]    Social Determinants of Health (SDOH) Interventions    Readmission Risk Interventions No flowsheet data found.

## 2021-02-06 LAB — BASIC METABOLIC PANEL
Anion gap: 6 (ref 5–15)
BUN: 25 mg/dL — ABNORMAL HIGH (ref 8–23)
CO2: 27 mmol/L (ref 22–32)
Calcium: 8.9 mg/dL (ref 8.9–10.3)
Chloride: 101 mmol/L (ref 98–111)
Creatinine, Ser: 0.72 mg/dL (ref 0.44–1.00)
GFR, Estimated: >60 mL/min (ref >60)
Glucose, Bld: 84 mg/dL (ref 70–99)
Potassium: 4.5 mmol/L (ref 3.5–5.1)
Sodium: 134 mmol/L — ABNORMAL LOW (ref 135–145)

## 2021-02-06 LAB — MAGNESIUM: Magnesium: 2.3 mg/dL (ref 1.7–2.4)

## 2021-02-06 LAB — CBC
HCT: 37.2 % (ref 36.0–46.0)
Hemoglobin: 12.6 g/dL (ref 12.0–15.0)
MCH: 30.1 pg (ref 26.0–34.0)
MCHC: 33.9 g/dL (ref 30.0–36.0)
MCV: 88.8 fL (ref 80.0–100.0)
Platelets: 279 10*3/uL (ref 150–400)
RBC: 4.19 MIL/uL (ref 3.87–5.11)
RDW: 14.3 % (ref 11.5–15.5)
WBC: 11.1 10*3/uL — ABNORMAL HIGH (ref 4.0–10.5)
nRBC: 0 % (ref 0.0–0.2)

## 2021-02-06 MED ORDER — TRAMADOL HCL 50 MG PO TABS: 50.0000 mg | ORAL_TABLET | Freq: Two times a day (BID) | ORAL | 0 refills | Status: AC | PRN

## 2021-02-06 MED ORDER — IPRATROPIUM-ALBUTEROL 0.5-2.5 (3) MG/3ML IN SOLN
3.0000 mL | Freq: Three times a day (TID) | RESPIRATORY_TRACT | Status: DC
  Administered 2021-02-06: 3 mL via RESPIRATORY_TRACT
  Filled 2021-02-06: qty 3

## 2021-02-06 NOTE — Progress Notes (Signed)
Physical Therapy Treatment Patient Details Name: Terri Burns MRN: [id number] DOB: 1951/03/23 Today's Date: 02/06/2021    History of Present Illness Terri Burns is a 70 y.o. Caucasian female with medical history significant for COPD, depression, GERD, hypertension and dyslipidemia, who presented to the ER with acute onset of worsening dyspnea with severe respiratory distress tripoding and having associated retractions in the ER.  She admitted to recent dry cough and wheezing with her dyspnea as well as chills without measured fever.    PT Comments    Pt cleared to remove cervical collar that she has worn the last 2 years.  Asked to assist with education regarding weaning and neck strengthening education.  During session pt indicates that currently she struggles with any prolonged walking (more than a few minutes), even with the collar, as pain in neck becomes too much.  Also attempted to gently test neck strength - c/o relatively strong pain with minimally resisted isometric lateral neck movements; she tolerated ~5 minutes seated w/o collar but had pain/fatigue that increased with even a modicum of AROM neck movement.  After prolonged discussion on precautions and collar weaning regimen PT instructed on single isometric protraction exercises and further instruction deferred given her extensive h/o neck issues/fusions, encouraged follow up with neurologist and likely further PT in in or outpt setting once more clarification is attained.    Follow Up Recommendations  Home health PT;Supervision - Intermittent (vs outpt PT for cervical strengthening and post-collar weaning/education)     Equipment Recommendations  None recommended by PT    Recommendations for Other Services       Precautions / Restrictions Precautions Precautions: Fall Precaution Comments: cervial collar X 2 years, cleared to remove by neuro 9/2 Restrictions Weight Bearing Restrictions: No    Mobility  Bed  Mobility Overal bed mobility: Modified Independent             General bed mobility comments: able to log roll, using UEs on rails she got to sitting w/o assist    Transfers Overall transfer level: Needs assistance Equipment used: Rolling walker (2 wheeled) Transfers: Sit to/from Stand Sit to Stand: Supervision         General transfer comment: Pt was able to rise with good confidence and only light HHA assist  Ambulation/Gait Ambulation/Gait assistance: Min guard Gait Distance (Feet): 5 Feet Assistive device: Rolling walker (2 wheeled)       General Gait Details: Pt was able to ambulate a few steps from bed to recliner with single HHA and no LOBs.  She was able to ambulate ~100 ft yesterday, reports having be up to/from the bathroom multiple times - did not feel like doing any prolonged ambulation at this time.   Stairs             Wheelchair Mobility    Modified Rankin (Stroke Patients Only)       Balance Overall balance assessment: Modified Independent   Sitting balance-Leahy Scale: Good       Standing balance-Leahy Scale: Good                              Cognition Arousal/Alertness: Awake/alert Behavior During Therapy: WFL for tasks assessed/performed Overall Cognitive Status: Within Functional Limits for tasks assessed  Exercises Other Exercises Other Exercises: educated on cervical strengthening and protection with weaning of collar wearing.  Educated on isometric protraction, started to test gentle isometric lateral neck mm with c/o pain with very little activity.  Deferred further testing/HEP training and encouraged her to follow up with neuro as an outpt Other Exercises: trial of seated collar off for ~5 minutes.  Again with pain/fatigue with limited time - very guarded gentle AROM ellicited pain.  Encouraged very guarded weaning of collar per neuro follow up    General  Comments        Pertinent Vitals/Pain Pain Assessment: Faces Faces Pain Scale: Hurts even more Pain Location: c/o neck pain with most movements    Home Living                      Prior Function            PT Goals (current goals can now be found in the care plan section) Progress towards PT goals: Progressing toward goals    Frequency    Min 2X/week      PT Plan Current plan remains appropriate    Co-evaluation              AM-PAC PT "6 Clicks" Mobility   Outcome Measure  Help needed turning from your back to your side while in a flat bed without using bedrails?: None Help needed moving from lying on your back to sitting on the side of a flat bed without using bedrails?: None Help needed moving to and from a bed to a chair (including a wheelchair)?: A Little Help needed standing up from a chair using your arms (e.g., wheelchair or bedside chair)?: A Little Help needed to walk in hospital room?: A Little Help needed climbing 3-5 steps with a railing? : A Little 6 Click Score: 20    End of Session   Activity Tolerance: Patient limited by pain;Patient limited by fatigue Patient left: in chair;with call bell/phone within reach;with chair alarm set Nurse Communication: Mobility status PT Visit Diagnosis: Unsteadiness on feet (R26.81);Muscle weakness (generalized) (M62.81);Other abnormalities of gait and mobility (R26.89)     Time: [id number] PT Time Calculation (min) (ACUTE ONLY): 31 min  Charges:  $Therapeutic Exercise: 8-22 mins $Therapeutic Activity: 8-22 mins                     Kreg Shropshire, DPT 02/06/2021, 3:01 PM

## 2021-02-06 NOTE — Discharge Summary (Signed)
Physician Discharge Summary   Terri Burns  female DOB: Sep 05, 1950  [id number]  PCP: Steele Sizer, MD  Admit date: 02/02/2021 Discharge date: 02/06/2021  Admitted From: home Disposition:  home Home Health: Yes CODE STATUS: Full code  Discharge Instructions     Discharge instructions   Complete by: As directed    You were treated for COPD flare up, and have improved, so you can go home and continue your home COPD regimen of nebs and inhalers.  Neurosurgery saw you during this hospitalization about your old C2 fracture in your neck, and cleared you from having to wear your cervical collar.  Because you have been wearing the collar for the past 2 years, your neck muscles may be weak, and so you need to slowly taper off the collar and do neck exercises as shown to you by therapists.  Not wearing the cervical collar should help with your neck pain and expanding your lungs.   Dr. Enzo Bi [facility] Course:  For full details, please see H&P, progress notes, consult notes and ancillary notes.  Briefly,  Terri Burns is a 70 y.o. Caucasian female with medical history significant for COPD, depression, hypertension, who presented to the ER with acute onset of worsening dyspnea with severe respiratory distress tripoding and having associated retractions in the ER.  She admitted to recent dry cough and wheezing with her dyspnea as well as chills without measured fever.     COPD acute exacerbation --on presentation, Pt was noted to be in respiratory distress, and was put on BiPAP, however, I can not find documentation of hypoxia.  --received 3 DuoNeb's, 2 g of IV magnesium sulfate, 125 mg IV Solu-Medrol in the ED. --Pt completed a course of steroid burst with prednisone, received scheduled DuoNeb, chest PT, and continued home Trelegy as Breo and Incruse during hospitalization.    Acute hypercapnic respiratory failure --ABG with pCO2 60, improved to 44 after BiPAP  use.  2.  Hypertensive urgency. - cont losartan and Toprol and amlodipine.  3.  Dyslipidemia. --cont statin  4.  Major depression. --cont celexa  5.  Peripheral neuropathy. --pt reported not taking Lyrica  6.  GERD --cont PPI   Urinary retention, resolved --1 episode, I/O cath with 1150 ml out. --has been voiding fine during the rest of the hospitalization.   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 Izora Ribas.  Per note, fracture was not healing, so surgical intervention was offered, but pt declined.  Pt has been wearing cervical collar for the past 2 years. --neurosurgery consulted, cleared pt to be off cervical collar after reviewing xrays of cervical spine --will need to gradually reduce time with cervical collar since pt's neck may be weak after 2 years of wearing the collar --PT to work with pt, and pt is to continue followup with Dr. Izora Ribas as outpatient. --tramadol 50 mg BID PRN prescribed at discharge for up to 7 days, per pt request, to last until pt follows up with Dr. Izora Ribas.   Discharge Diagnoses:  Active Problems:   COPD exacerbation ([place])   30 Day Unplanned Readmission Risk Score    Flowsheet Row ED to Hosp-Admission (Current) from 02/02/2021 in [place] (1C)  30 Day Unplanned Readmission Risk Score (%) 15.1 Filed at 02/06/2021 0800       This score is the patient's risk of an unplanned readmission  within 30 days of being discharged (0 -100%). The score is based on dignosis, age, lab data, medications, orders, and past utilization.   Low:  0-14.9   Medium: 15-21.9   High: 22-29.9   Extreme: 30 and above         Discharge Instructions:  Allergies as of 02/06/2021       Reactions   Levofloxacin Shortness Of Breath   Penicillins Hives   itching   Meloxicam    dizziness   Nsaids Other (See Comments)   Patient states she can tolerate up to three doses per day without  incident        Medication List     STOP taking these medications    nicotine 21 mg/24hr patch Commonly known as: NICODERM CQ - dosed in mg/24 hours   pregabalin 50 MG capsule Commonly known as: Lyrica       TAKE these medications    albuterol 108 (90 Base) MCG/ACT inhaler Commonly known as: VENTOLIN HFA Inhale 1-2 puffs into the lungs every 6 (six) hours as needed for wheezing or shortness of breath.   Allergy Relief 10 MG tablet Generic drug: loratadine TAKE ONE TABLET BY MOUTH BEFORE BREAKFAST What changed: See the new instructions.   amLODipine 5 MG tablet Commonly known as: NORVASC Take 1 tablet (5 mg total) by mouth daily.   aspirin 81 MG EC tablet TAKE 1 TABLET BY MOUTH EVERY DAY   atorvastatin 20 MG tablet Commonly known as: LIPITOR TAKE ONE TABLET BY MOUTH BEFORE BREAKFAST   citalopram 20 MG tablet Commonly known as: CELEXA TAKE ONE TABLET BY MOUTH EVERY EVENING   fluticasone 50 MCG/ACT nasal spray Commonly known as: FLONASE Place 2 sprays into both nostrils daily.   ipratropium-albuterol 0.5-2.5 (3) MG/3ML Soln Commonly known as: DUONEB Inhale 3 mLs into the lungs every 4 (four) hours as needed.   losartan 100 MG tablet Commonly known as: COZAAR TAKE ONE TABLET BY MOUTH EVERY EVENING   metoprolol succinate 25 MG 24 hr tablet Commonly known as: TOPROL-XL TAKE ONE TABLET BY MOUTH EVERY EVENING   pantoprazole 40 MG tablet Commonly known as: PROTONIX TAKE ONE TABLET BY MOUTH EVERY EVENING   traMADol 50 MG tablet Commonly known as: ULTRAM Take 1 tablet (50 mg total) by mouth 2 (two) times daily as needed for up to 7 days for moderate pain or severe pain.   Trelegy Ellipta 100-62.5-25 MCG/INH Aepb Generic drug: Fluticasone-Umeclidin-Vilant INHALE ONE PUFF BY MOUTH into THE lungs daily What changed: See the new instructions.         Follow-up Information     Steele Sizer, MD Follow up in 1 week(s).   Specialty: Family  Medicine Why: Office closed patient to make own follow up appt Contact information: [place] [address] [phone]         Meade Maw, MD Follow up.   Specialty: Neurosurgery Why: As needed Contact information: [place] [address] [phone]                 Allergies  Allergen Reactions   Levofloxacin Shortness Of Breath   Penicillins Hives    itching   Meloxicam     dizziness   Nsaids Other (See Comments)    Patient states she can tolerate up to three doses per day without incident     The results of significant diagnostics from this hospitalization (including imaging, microbiology, ancillary and laboratory) are listed below for  reference.   Consultations:   Procedures/Studies: DG Cervical Spine With Flex & Extend  Result Date: 02/04/2021 CLINICAL DATA:  History of neck fracture posterior neck pain EXAM: CERVICAL SPINE COMPLETE WITH FLEXION AND EXTENSION VIEWS COMPARISON:  CT 11/08/2018 FINDINGS: Post fusion hardware C5-C6. Known C2 fracture is faintly visible. No appreciable change in alignment with flexion or extension. Remaining vertebral bodies demonstrate normal stature. Moderate degenerative change C4-C5. The lateral masses are grossly within normal limits. IMPRESSION: 1. Post fusion hardware at C5-C6. 2. Faintly visible known type 2 dens fracture but better seen on CT. No appreciable change in alignment with flexion or extension 3. Degenerative changes Electronically Signed   By: Donavan Foil M.D.   On: 02/04/2021 21:15   DG Chest Port 1 View  Result Date: 02/02/2021 CLINICAL DATA:  Shortness of breath. EXAM: PORTABLE CHEST 1 VIEW COMPARISON:  September 13, 2020 FINDINGS: Stable, diffuse chronic increased lung markings are seen. Very mild, stable scarring and/or atelectasis is seen within the lateral aspect of the left lung base. There is no evidence of a pleural effusion or pneumothorax. The heart  size and mediastinal contours are within normal limits. Radiopaque surgical pins are seen within the left humeral head. IMPRESSION: Chronic increased lung markings without acute or active cardiopulmonary disease. Electronically Signed   By: Virgina Norfolk M.D.   On: 02/02/2021 01:51      Labs: BNP (last 3 results) Recent Labs    09/13/20 0728 02/02/21 0125  BNP 181.8* [id number]*   Basic Metabolic Panel: Recent Labs  Lab 02/02/21 0125 02/02/21 0633 02/04/21 0513 02/05/21 0404 02/06/21 0414  NA 133* 134* 137 135 134*  K 4.1 4.3 4.6 4.2 4.5  CL 99 98 101 100 101  CO2 '25 25 28 26 27  '$ GLUCOSE 105* 167* 96 85 84  BUN '16 20 17 22 '$ 25*  CREATININE 0.89 0.85 0.72 0.71 0.72  CALCIUM 8.4* 7.9* 8.7* 8.8* 8.9  MG  --   --  2.3 2.1 2.3   Liver Function Tests: Recent Labs  Lab 02/02/21 0125  AST 41  ALT 27  ALKPHOS 66  BILITOT 0.3  PROT 7.8  ALBUMIN 4.2   No results for input(s): LIPASE, AMYLASE in the last 168 hours. No results for input(s): AMMONIA in the last 168 hours. CBC: Recent Labs  Lab 02/02/21 0125 02/02/21 [id number] 02/04/21 0513 02/05/21 0404 02/06/21 0414  WBC 13.1* 11.6* 10.4 10.6* 11.1*  NEUTROABS 5.9  --   --   --   --   HGB 13.1 12.7 12.3 12.6 12.6  HCT 38.5 38.9 37.1 38.3 37.2  MCV 89.3 88.4 90.0 89.1 88.8  PLT 320 274 247 250 279   Cardiac Enzymes: No results for input(s): CKTOTAL, CKMB, CKMBINDEX, TROPONINI in the last 168 hours. BNP: Invalid input(s): POCBNP CBG: No results for input(s): GLUCAP in the last 168 hours. D-Dimer No results for input(s): DDIMER in the last 72 hours. Hgb A1c No results for input(s): HGBA1C in the last 72 hours. Lipid Profile No results for input(s): CHOL, HDL, LDLCALC, TRIG, CHOLHDL, LDLDIRECT in the last 72 hours. Thyroid function studies No results for input(s): TSH, T4TOTAL, T3FREE, THYROIDAB in the last 72 hours.  Invalid input(s): FREET3 Anemia work up No results for input(s): VITAMINB12, FOLATE, FERRITIN,  TIBC, IRON, RETICCTPCT in the last 72 hours. Urinalysis    Component Value Date/Time   COLORURINE STRAW (A) 02/02/2021 1507   APPEARANCEUR CLEAR (A) 02/02/2021 1507   LABSPEC 1.010 02/02/2021 1507  PHURINE 6.0 02/02/2021 1507   GLUCOSEU NEGATIVE 02/02/2021 1507   HGBUR SMALL (A) 02/02/2021 1507   BILIRUBINUR NEGATIVE 02/02/2021 1507   KETONESUR NEGATIVE 02/02/2021 1507   PROTEINUR NEGATIVE 02/02/2021 1507   NITRITE NEGATIVE 02/02/2021 1507   LEUKOCYTESUR NEGATIVE 02/02/2021 1507   Sepsis Labs Invalid input(s): PROCALCITONIN,  WBC,  LACTICIDVEN Microbiology Recent Results (from the past 240 hour(s))  Resp Panel by RT-PCR (Flu A&B, Covid) Nasopharyngeal Swab     Status: None   Collection Time: 02/02/21  1:25 AM   Specimen: Nasopharyngeal Swab; Nasopharyngeal(NP) swabs in vial transport medium  Result Value Ref Range Status   SARS Coronavirus 2 by RT PCR NEGATIVE NEGATIVE Final    Comment: (NOTE) SARS-CoV-2 target nucleic acids are NOT DETECTED.  The SARS-CoV-2 RNA is generally detectable in upper respiratory specimens during the acute phase of infection. The lowest concentration of SARS-CoV-2 viral copies this assay can detect is 138 copies/mL. A negative result does not preclude SARS-Cov-2 infection and should not be used as the sole basis for treatment or other patient management decisions. A negative result may occur with  improper specimen collection/handling, submission of specimen other than nasopharyngeal swab, presence of viral mutation(s) within the areas targeted by this assay, and inadequate number of viral copies(<138 copies/mL). A negative result must be combined with clinical observations, patient history, and epidemiological information. The expected result is Negative.  Fact Sheet for Patients:  EntrepreneurPulse.com.au  Fact Sheet for Healthcare Providers:  IncredibleEmployment.be  This test is no t yet approved or  cleared by the [place] FDA and  has been authorized for detection and/or diagnosis of SARS-CoV-2 by FDA under an Emergency Use Authorization (EUA). This EUA will remain  in effect (meaning this test can be used) for the duration of the COVID-19 declaration under Section 564(b)(1) of the Act, 21 U.S.C.section 360bbb-3(b)(1), unless the authorization is terminated  or revoked sooner.       Influenza A by PCR NEGATIVE NEGATIVE Final   Influenza B by PCR NEGATIVE NEGATIVE Final    Comment: (NOTE) The Xpert Xpress SARS-CoV-2/FLU/RSV plus assay is intended as an aid in the diagnosis of influenza from Nasopharyngeal swab specimens and should not be used as a sole basis for treatment. Nasal washings and aspirates are unacceptable for Xpert Xpress SARS-CoV-2/FLU/RSV testing.  Fact Sheet for Patients: EntrepreneurPulse.com.au  Fact Sheet for Healthcare Providers: IncredibleEmployment.be  This test is not yet approved or cleared by the [place] FDA and has been authorized for detection and/or diagnosis of SARS-CoV-2 by FDA under an Emergency Use Authorization (EUA). This EUA will remain in effect (meaning this test can be used) for the duration of the COVID-19 declaration under Section 564(b)(1) of the Act, 21 U.S.C. section 360bbb-3(b)(1), unless the authorization is terminated or revoked.  Performed at [facility], [address] [place], [place] [address]      Total time spend on discharging this patient, including the last patient exam, discussing the hospital stay, instructions for ongoing care as it relates to all pertinent caregivers, as well as preparing the medical discharge records, prescriptions, and/or referrals as applicable, is 45 minutes.    Enzo Bi, MD  [facility] 02/06/2021, 1:13 PM

## 2021-02-06 NOTE — Progress Notes (Signed)
Patient discharged home in stable condition. Verbalized understanding of all discharge instructions, including follow up appointments.

## 2021-02-09 ENCOUNTER — Telehealth: Payer: Self-pay

## 2021-02-09 NOTE — Telephone Encounter (Signed)
Transition Care Management Unsuccessful Follow-up Telephone Call  Date of discharge and from where:  02/06/21 [facility]  Attempts:  1st Attempt  Reason for unsuccessful TCM follow-up call:  Left voice message. Pt may reach me directly at [phone]

## 2021-02-10 ENCOUNTER — Telehealth: Payer: Self-pay

## 2021-02-10 NOTE — Telephone Encounter (Signed)
Transition Care Management Follow-up Telephone Call Date of discharge and from where: 02/06/21 How have you been since you were released from the hospital? Pt states she is feeling tired, c/o pain in her neck and taking tramadol BID Any questions or concerns? No  Items Reviewed: Did the pt receive and understand the discharge instructions provided? Yes  Medications obtained and verified? Yes  Other? No  Any new allergies since your discharge? No  Dietary orders reviewed? Yes Do you have support at home? Yes   Home Care and Equipment/Supplies: Were home health services ordered? yes If so, what is the name of the agency? [facility]  Has the agency set up a time to come to the patient's home? yes Were any new equipment or medical supplies ordered?  No  Functional Questionnaire: (I = Independent and D = Dependent) ADLs: I  Bathing/Dressing- I  Meal Prep- I  Eating- I  Maintaining continence- I  Transferring/Ambulation- I  Managing Meds- I  Follow up appointments reviewed:  PCP Hospital f/u appt confirmed? Yes  Scheduled to see Dr. Ancil Boozer on 02/19/21 @ 1:20. [facility] f/u appt confirmed? No  pt to schedule appt with Dr. Cari Caraway Are transportation arrangements needed? No  If their condition worsens, is the pt aware to call PCP or go to the Emergency Dept.? Yes Was the patient provided with contact information for the PCP's office or ED? Yes Was to pt encouraged to call back with questions or concerns? Yes

## 2021-02-10 NOTE — Progress Notes (Addendum)
Chronic Care Management Pharmacy Assistant   Name: Terri Burns  MRN: [id number] DOB: 10-26-1950  Reason for Encounter: Medication Review/Medication Coordination/COPD Disease State Call  Recent office visits:  None ID  Recent consult visits:  None ID  Hospital visits:  Medication Reconciliation was completed by comparing discharge summary, patient's EMR and Pharmacy list, and upon discussion with patient.  Admitted to the hospital on 02/02/2021 due to COPD Exacerbation. Discharge date was 02/06/2021. Discharged from [facility].    New?Medications Started at [facility] Discharge:?? -started None ID  Medication Changes at Hospital Discharge: -Changed Trelegy Ellipta 100-62.5-25 MCG/INH Aepb INHALE ONE PUFF BY MOUTH into THE lungs Daily   Medications Discontinued at Hospital Discharge: STOP taking: nicotine 21 mg/24hr patch (NICODERM CQ - dosed in mg/24 hours) pregabalin 50 MG capsule (Lyrica)  Medications that remain the same after Hospital Discharge:??  -All other medications will remain the same.    Medications: Outpatient Encounter Medications as of 02/10/2021  Medication Sig   albuterol (VENTOLIN HFA) 108 (90 Base) MCG/ACT inhaler Inhale 1-2 puffs into the lungs every 6 (six) hours as needed for wheezing or shortness of breath.   ALLERGY RELIEF 10 MG tablet TAKE ONE TABLET BY MOUTH BEFORE BREAKFAST (Patient taking differently: Take 10 mg by mouth daily.)   amLODipine (NORVASC) 5 MG tablet Take 1 tablet (5 mg total) by mouth daily.   aspirin 81 MG EC tablet TAKE 1 TABLET BY MOUTH EVERY DAY   atorvastatin (LIPITOR) 20 MG tablet TAKE ONE TABLET BY MOUTH BEFORE BREAKFAST   citalopram (CELEXA) 20 MG tablet TAKE ONE TABLET BY MOUTH EVERY EVENING   fluticasone (FLONASE) 50 MCG/ACT nasal spray Place 2 sprays into both nostrils daily.   ipratropium-albuterol (DUONEB) 0.5-2.5 (3) MG/3ML SOLN Inhale 3 mLs into the lungs every 4 (four) hours as needed.    losartan (COZAAR) 100 MG tablet TAKE ONE TABLET BY MOUTH EVERY EVENING   metoprolol succinate (TOPROL-XL) 25 MG 24 hr tablet TAKE ONE TABLET BY MOUTH EVERY EVENING   pantoprazole (PROTONIX) 40 MG tablet TAKE ONE TABLET BY MOUTH EVERY EVENING   traMADol (ULTRAM) 50 MG tablet Take 1 tablet (50 mg total) by mouth 2 (two) times daily as needed for up to 7 days for moderate pain or severe pain.   TRELEGY ELLIPTA 100-62.5-25 MCG/INH AEPB INHALE ONE PUFF BY MOUTH into THE lungs daily   No facility-administered encounter medications on file as of 02/10/2021.   Care Gaps: Zoster Vaccines- Shingrix  COLONOSCOPY COVID-19 Vaccine  3rd Booster INFLUENZA VACCINE (Last completed 11/14/2019) MAMMOGRAM (Last ordered 02/06/2020) DEXA SCAN (Last Ordered 02/21/2020)  Star Rating Drugs: Atorvastatin 20 mg last filled on 01/13/2021 for a 30 day supply with [facility] Losartan 100 mg last filled on 01/13/2021 for a 30-Day supply with [facility]  BP Readings from Last 3 Encounters:  02/06/21 (!) 143/84  09/15/20 (!) 144/93  08/12/20 (!) 144/92    No results found for: HGBA1C   Patient obtains medications through Adherence Packaging  30 Days   Last adherence delivery included:  Trelegy Aer 100-62.5-25  mcg Ellipta- 1 puff by mouth daily Losartan 100 mg-take 1 tablet by mouth every day Evening Meals Metoprolol 25 mg-take 1 tablet Evening Meals Citalopram 20 mg-take one tablet in the Evening Meals Pantoprazole 40 mg- take one tablet Evening Meals Atorvastatin 20 mg- take one tablet daily before breakfast Loratadine 10 mg-take one tablet daily before breakfast Amlodipine 5 mg one tablet daily before breakfast  Patient  declined the following medications: Nicotine patch (patient [facility] day supply [facility]16[facility]2020.-This medication has been discontinued fluticasone nasal spray (PRN)(Patient states patient receive a bottle from the hospital) Albuterol 108 mcg/Act  (PRN) Aspirin 81 mg EC (OTC)  Patient is due for next adherence delivery on: 02/18/2021 2nd Route. Called patient and reviewed medications and coordinated delivery.  This delivery to include: Trelegy Aer 100 mcg Ellipta- 1 puff by mouth daily Allergy Relief 10 mg tablet 1 tablet daily before breakfast Losartan 100 mg-take 1 tablet by mouth every day Evening Meals Metoprolol 25 mg-take 1 tablet Evening Meals Citalopram 20 mg-take one tablet in the Evening Meals Pantoprazole 40 mg- take one tablet Evening Meals Atorvastatin 20 mg- take one tablet daily before breakfast Amlodipine 5 mg one tablet daily before breakfast  Patient declined the following medications:  Nicotine patch (patient receive [facility] day supply [facility] on 09/16/2020.-This medication has been discontinued fluticasone nasal spray (PRN)(Patient states patient receive a bottle from the hospital) Albuterol 108 mcg/Act (PRN) (patient received a bottle from the hospital Aspirin 81 mg EC (OTC) patient purchased 2 bottles on her own and she is on her second bottle  Patient needs refills for None ID.  Confirmed delivery date of 02/18/2021 2nd shift, advised patient that pharmacy will contact them the morning of delivery.  Current COPD regimen:  Trelegy Aer 100 mcg Ellipta- 1 puff by mouth daily Ipratropium-albuterol 0.5-2.5 mg every 4 hours prn Albuterol 108 inhale 1-2 puffs q6h prn  No flowsheet data found.  Any recent hospitalizations or ED visits since last visit with CPP? Yes  Reports confirms COPD symptoms, including Increased shortness of breath  and Symptoms worse with exercise. Patient was recently admitted to the hospital for a COPD exacerbation recently. She reports that she is doing much better day by day. She has been home for 5 days and reports her first few days she felt really weak. She reports that the hospital sent her home with albuterol inhalers, fluticasone nasal spray, and medication for her  nebulizer. She is no longer on steroids as she completed those before she left the hospital, and she was also taken off of Muxinex prior to leaving the hospital but she stated that she may go pick up another box from [facility]. She denies wheezing, she states that she does not have shortness of breath when resting. Patient has an appointment with PCP next week on 02/19/2021 and I encouraged her to call me if her symptoms worsen before her appointment.   What recent interventions/DTPs have been made by any provider to improve breathing since last visit:  Have you had exacerbation/flare-up since last visit? Yes  What do you do when you are short of breath?  Adhere to COPD Action Plan, Rescue medication, and Rest.   Respiratory Devices/Equipment Do you have a nebulizer? Yes Do you use a Peak Flow Meter? Yes Do you use a maintenance inhaler? Yes How often do you forget to use your daily inhaler? Never Do you use a rescue inhaler? Yes How often do you use your rescue inhaler?  daily Do you use a spacer with your inhaler? No  Adherence Review: Does the patient have >5 day gap between last estimated fill date for maintenance inhaler medications? No  Patient has a scheduled appointment to see [name], CPP on 04/07/2021 '@1100'$    Lynann Bologna, CPA/CMA Clinical Pharmacist Assistant Phone: [phone]   Addendum 9/12: Patient with worsening COPD symptoms, but adherent to COPD plan  and has PCP follow-up on 9/16. No further action required at this time.   Malva Limes, [facility] [phone]

## 2021-02-12 ENCOUNTER — Other Ambulatory Visit: Payer: Self-pay | Admitting: Family Medicine

## 2021-02-12 DIAGNOSIS — I10 Essential (primary) hypertension: Secondary | ICD-10-CM

## 2021-02-12 NOTE — Telephone Encounter (Signed)
Medication: traMADol (ULTRAM) 50 MG tablet [id number]   Has the patient contacted their pharmacy? YES  (Agent: If no, request that the patient contact the pharmacy for the refill.) (Agent: If yes, when and what did the pharmacy advise?)  [facility]erred Pharmacy (with phone number or street name[id number] [address]: [phone]: [phone]: Not open 24 hours    Agent: Please be advised that RX refills may take up to 3 business days. We ask that you follow-up with your pharmacy.

## 2021-02-12 NOTE — Telephone Encounter (Signed)
Pt has an appt next week

## 2021-02-13 NOTE — Telephone Encounter (Signed)
Requested medication (s) are due for refill today: yes  Requested medication (s) are on the active medication list: yes  Last refill:  02/06/21 #14  Future visit scheduled: yes  Notes to clinic:  medication not delegated to NT to RF   Requested Prescriptions  Pending Prescriptions Disp Refills   traMADol (ULTRAM) 50 MG tablet 14 tablet 0    Sig: Take 1 tablet (50 mg total) by mouth 2 (two) times daily as needed for up to 7 days for moderate pain or severe pain.     Not Delegated - Analgesics:  Opioid Agonists Failed - 02/12/2021  9:52 PM      Failed - This refill cannot be delegated      Failed - Urine Drug Screen completed in last 360 days      Failed - Valid encounter within last 6 months    Recent Outpatient Visits           6 months ago Atherosclerosis of aorta [facility])   [facility] Steele Sizer, MD   11 months ago Recurrent major depressive disorder, in full remission [facility])   [facility] Steele Sizer, MD   1 year ago Atherosclerosis of aorta [facility])   [facility] Steele Sizer, MD   1 year ago Centrilobular emphysema [facility])   [facility] Steele Sizer, MD   1 year ago Centrilobular emphysema [facility])   [facility] Steele Sizer, MD       Future Appointments             In 6 days Steele Sizer, MD [facility], [place]   In 5 months  [place]

## 2021-02-18 NOTE — Progress Notes (Deleted)
Name: Terri Burns   MRN: [id number]    DOB: [birth year]   Date:02/18/2021       Progress Note  Subjective  Chief Complaint  Follow Up  HPI  Chronic neck pain: she has been on disability since 2007,   had x-ray and MRI done, she has DDD.  She fell March 2020 and fracture her c-spine at C2 level, she went to [facility] just EC, but she fell down again a few days later and had to be admitted because of severe hypotension , hypoxia and had COPD exacerbation. She continues to have constant  neck pain, wearing a neck color, still taking  Celebrex. She was referred to pain clinic but states only seen by  Dr. Cari Caraway at [facility]. She is off hydrocodone, she states never got rx of lyrica but would like to try it . .    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 No chest pain or palpitation. 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 She has sob with mild activity also has a daily productive cough but no wheezing. She has been on Trelegy and also uses nebulizer machine almost daily    Major depression: she was in remission, taking citalopram, however lost her daughter May 2020 and was grieving. 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   Chronic neck pain 01/01/2018   Benign essential HTN 01/01/2018   Senile purpura ([place]) 01/01/2018   Recurrent major depressive disorder, in full remission ([place]) 01/01/2018   Hyperglycemia 09/27/2016   Tobacco abuse 08/20/2015   Allergic rhinitis, seasonal 05/12/2015   Emphysema lung ([place]) 05/12/2015   Degeneration of intervertebral disc of cervical region 05/12/2015   GERD (gastroesophageal reflux disease) 05/12/2015   Major depression in remission ([place]) 05/12/2015    Past Surgical History:  Procedure Laterality Date   NECK SURGERY N/A 10/2003   SHOULDER SURGERY Left 2004    Family History  Problem Relation Age of Onset   Hypertension Mother    Stroke Mother    Heart disease Father    Diabetes Father    Heart disease Daughter    Pulmonary embolism Daughter    Hypertension Son     Social History   Tobacco Use   Smoking status: Every Day    Packs/day: 0.25    Years: 45.00    Pack years: 11.25    Types: Cigarettes    Start date: 12/10/1973   Smokeless tobacco: Never   Tobacco comments:    4 cigarettes daily   Substance Use Topics   Alcohol use: Yes    Alcohol/week: 0.0 standard drinks    Comment: occasionally  Current Outpatient Medications:    albuterol (VENTOLIN HFA) 108 (90 Base) MCG/ACT inhaler, Inhale 1-2 puffs into the lungs every 6 (six) hours as needed for wheezing or shortness of breath., Disp: , Rfl:    ALLERGY RELIEF 10 MG tablet, TAKE ONE TABLET BY MOUTH BEFORE BREAKFAST (Patient taking differently: Take 10 mg by mouth daily.), Disp: 90 tablet, Rfl: 0   amLODipine (NORVASC) 5 MG tablet, TAKE ONE TABLET BY MOUTH BEFORE BREAKFAST, Disp: 90 tablet, Rfl: 0   aspirin 81 MG EC tablet, TAKE 1 TABLET BY MOUTH EVERY DAY, Disp: 90 tablet, Rfl: 0   atorvastatin (LIPITOR) 20 MG tablet, TAKE ONE TABLET BY MOUTH BEFORE BREAKFAST, Disp: 90 tablet, Rfl: 0   citalopram (CELEXA) 20 MG tablet, TAKE ONE TABLET BY MOUTH EVERY  EVENING, Disp: 90 tablet, Rfl: 0   fluticasone (FLONASE) 50 MCG/ACT nasal spray, Place 2 sprays into both nostrils daily., Disp: 48 mL, Rfl: 0   ipratropium-albuterol (DUONEB) 0.5-2.5 (3) MG/3ML SOLN, Inhale 3 mLs into the lungs every 4 (four) hours as needed., Disp: 120 mL, Rfl: 5   losartan (COZAAR) 100 MG tablet, TAKE ONE TABLET BY MOUTH EVERY EVENING, Disp: 90 tablet, Rfl: 0   metoprolol succinate (TOPROL-XL) 25 MG 24 hr tablet, TAKE ONE TABLET BY MOUTH EVERY EVENING, Disp: 90 tablet, Rfl: 0   pantoprazole (PROTONIX) 40 MG tablet, TAKE ONE TABLET BY MOUTH EVERY EVENING, Disp: 90 tablet, Rfl: 0   TRELEGY ELLIPTA 100-62.5-25 MCG/INH AEPB, INHALE ONE PUFF BY MOUTH into THE lungs daily, Disp: 60 each, Rfl: 0  Allergies  Allergen Reactions   Levofloxacin Shortness Of Breath   Penicillins Hives    itching   Meloxicam     dizziness   Nsaids Other (See Comments)    Patient states she can tolerate up to three doses per day without incident    I personally reviewed {[id number]} with the patient/caregiver today.   ROS  ***  Objective  There were no vitals filed for this visit.  There is no height or weight on file to calculate BMI.  Physical Exam ***  Recent Results (from the past 2160 hour(s))  CBC with Differential     Status: Abnormal   Collection Time: 02/02/21  1:25 AM  Result Value Ref Range   WBC 13.1 (H) 4.0 - 10.5 K/uL   RBC 4.31 3.87 - 5.11 MIL/uL   Hemoglobin 13.1 12.0 - 15.0 g/dL   HCT 38.5 36.0 - 46.0 %   MCV 89.3 80.0 - 100.0 fL   MCH 30.4 26.0 - 34.0 pg   MCHC 34.0 30.0 - 36.0 g/dL   RDW 13.9 11.5 - 15.5 %   Platelets 320 150 - 400 K/uL   nRBC 0.0 0.0 - 0.2 %   Neutrophils Relative % 43 %   Neutro Abs 5.9 1.7 - 7.7 K/uL   Lymphocytes Relative 42 %   Lymphs Abs 5.5 (H) 0.7 - 4.0 K/uL   Monocytes Relative 8 %   Monocytes Absolute 1.0 0.1 - 1.0 K/uL   Eosinophils Relative 4 %   Eosinophils Absolute 0.5 0.0 - 0.5 K/uL   Basophils Relative 1 %    Basophils Absolute 0.1 0.0 - 0.1 K/uL   Immature Granulocytes 2 %   Abs Immature Granulocytes 0.20 (H) 0.00 - 0.07 K/uL    Comment: Performed at [facility], [address], [place], [place] [address]  Comprehensive metabolic panel     Status: Abnormal   Collection Time: 02/02/21  1:25 AM  Result Value Ref Range   Sodium 133 (L) 135 - 145 mmol/L   Potassium 4.1 3.5 - 5.1 mmol/L   Chloride 99 98 - 111 mmol/L   CO2 25 22 - 32 mmol/L   Glucose, Bld 105 (H) 70 - 99 mg/dL    Comment: Glucose reference range applies only to samples taken after fasting for at least 8 hours.   BUN 16 8 - 23 mg/dL   Creatinine, Ser 0.89 0.44 - 1.00 mg/dL   Calcium 8.4 (L) 8.9 - 10.3 mg/dL   Total Protein 7.8 6.5 - 8.1 g/dL   Albumin 4.2 3.5 - 5.0 g/dL   AST 41 15 - 41 U/L   ALT 27 0 - 44 U/L   Alkaline Phosphatase 66 38 - 126 U/L   Total Bilirubin 0.3 0.3 - 1.2 mg/dL   GFR, Estimated >60 >60 mL/min    Comment: (NOTE) Calculated using the CKD-EPI Creatinine Equation (2021)    Anion gap 9 5 - 15    Comment: Performed at [facility], [address] [place], [place] [address]  Brain natriuretic peptide     Status: Abnormal   Collection Time: 02/02/21  1:25 AM  Result Value Ref Range   B Natriuretic Peptide 283.2 (H) 0.0 - 100.0 pg/mL    Comment: Performed at [facility], [place], [place] [address]  Troponin I (High Sensitivity)     Status: None   Collection Time: 02/02/21  1:25 AM  Result Value Ref Range   Troponin I (High Sensitivity) 4 <18 ng/L    Comment: (NOTE) Elevated high sensitivity troponin I (hsTnI) values and significant  changes across serial measurements may suggest ACS but many other  chronic and acute conditions are known to elevate hsTnI results.  Refer to the "Links" section for chest pain algorithms and additional  guidance. Performed at [facility], [place], [place] [address]   Resp Panel by RT-PCR (Flu  A&B, Covid) Nasopharyngeal Swab     Status: None   Collection Time: 02/02/21  1:25 AM   Specimen: Nasopharyngeal Swab; Nasopharyngeal(NP) swabs in vial transport medium  Result Value Ref Range   SARS Coronavirus 2 by RT PCR NEGATIVE NEGATIVE    Comment: (NOTE) SARS-CoV-2 target nucleic acids are NOT DETECTED.  The SARS-CoV-2 RNA is generally detectable in upper respiratory specimens during the acute phase of infection. The lowest concentration of SARS-CoV-2 viral copies this assay can detect is 138 copies/mL. A negative result does not preclude SARS-Cov-2 infection and should not be used as the sole basis for treatment or other patient management decisions. A negative result may occur with  improper specimen collection/handling, submission of specimen other than nasopharyngeal swab, presence of viral mutation(s) within the areas targeted by this assay, and inadequate number of viral copies(<138 copies/mL). A negative result must be combined with clinical observations, patient history, and epidemiological information. The expected result is Negative.  Fact Sheet for Patients:  EntrepreneurPulse.com.au  Fact Sheet for Healthcare Providers:  IncredibleEmployment.be  This test is no t yet approved or cleared by the [place] FDA and  has been authorized for detection and/or diagnosis of SARS-CoV-2 by FDA under an Emergency Use Authorization (EUA). This EUA will remain  in effect (meaning this test can be used) for the duration of the COVID-19 declaration under Section 564(b)(1) of the Act, 21 U.S.C.section 360bbb-3(b)(1), unless the authorization is terminated  or revoked sooner.       Influenza A  by PCR NEGATIVE NEGATIVE   Influenza B by PCR NEGATIVE NEGATIVE    Comment: (NOTE) The Xpert Xpress SARS-CoV-2/FLU/RSV plus assay is intended as an aid in the diagnosis of influenza from Nasopharyngeal swab specimens and should not be used as a  sole basis for treatment. Nasal washings and aspirates are unacceptable for Xpert Xpress SARS-CoV-2/FLU/RSV testing.  Fact Sheet for Patients: EntrepreneurPulse.com.au  Fact Sheet for Healthcare Providers: IncredibleEmployment.be  This test is not yet approved or cleared by the [place] FDA and has been authorized for detection and/or diagnosis of SARS-CoV-2 by FDA under an Emergency Use Authorization (EUA). This EUA will remain in effect (meaning this test can be used) for the duration of the COVID-19 declaration under Section 564(b)(1) of the Act, 21 U.S.C. section 360bbb-3(b)(1), unless the authorization is terminated or revoked.  Performed at [facility], [address] [place], [place] [address]   Blood gas, arterial     Status: Abnormal   Collection Time: 02/02/21  1:26 AM  Result Value Ref Range   FIO2 0.50    Delivery systems BILEVEL POSITIVE AIRWAY PRESSURE    Inspiratory PAP 10    Expiratory PAP 5    pH, Arterial 7.24 (L) 7.350 - 7.450   pCO2 arterial 60 (H) 32.0 - 48.0 mmHg   pO2, Arterial 219 (H) 83.0 - 108.0 mmHg   Bicarbonate 25.7 20.0 - 28.0 mmol/L   Acid-base deficit 2.7 (H) 0.0 - 2.0 mmol/L   O2 Saturation 99.7 %   Patient temperature 37.0    Collection site LEFT RADIAL    Sample type ARTERIAL DRAW    Allens test (pass/fail) PASS PASS    Comment: Performed at [facility], [place], [place] [address]  Troponin I (High Sensitivity)     Status: None   Collection Time: 02/02/21  6:33 AM  Result Value Ref Range   Troponin I (High Sensitivity) 6 <18 ng/L    Comment: (NOTE) Elevated high sensitivity troponin I (hsTnI) values and significant  changes across serial measurements may suggest ACS but many other  chronic and acute conditions are known to elevate hsTnI results.  Refer to the "Links" section for chest pain algorithms and additional  guidance. Performed at [facility], [address] [place], [place] [id number]   Basic metabolic panel     Status: Abnormal   Collection Time: 02/02/21  6:33 AM  Result Value Ref Range   Sodium 134 (L) 135 - 145 mmol/L   Potassium 4.3 3.5 - 5.1 mmol/L   Chloride 98 98 - 111 mmol/L   CO2 25 22 - 32 mmol/L   Glucose, Bld 167 (H) 70 - 99 mg/dL    Comment: Glucose reference range applies only to samples taken after fasting for at least 8 hours.   BUN 20 8 - 23 mg/dL   Creatinine, Ser 0.85 0.44 - 1.00 mg/dL   Calcium 7.9 (L) 8.9 - 10.3 mg/dL   GFR, Estimated >60 >60 mL/min    Comment: (NOTE) Calculated using the CKD-EPI Creatinine Equation (2021)    Anion gap 11 5 - 15    Comment: Performed at [facility], [address] [place], [place] [address]  CBC     Status: Abnormal   Collection Time: 02/02/21  6:33 AM  Result Value Ref Range   WBC 11.6 (H) 4.0 - 10.5 K/uL   RBC 4.40 3.87 - 5.11 MIL/uL   Hemoglobin 12.7 12.0 - 15.0 g/dL  HCT 38.9 36.0 - 46.0 %   MCV 88.4 80.0 - 100.0 fL   MCH 28.9 26.0 - 34.0 pg   MCHC 32.6 30.0 - 36.0 g/dL   RDW 14.1 11.5 - 15.5 %   Platelets 274 150 - 400 K/uL   nRBC 0.0 0.0 - 0.2 %    Comment: Performed at [facility], [address] [place], [place] [address]  Blood gas, arterial     Status: None   Collection Time: 02/02/21  7:31 AM  Result Value Ref Range   FIO2 28.00    Delivery systems BILEVEL POSITIVE AIRWAY PRESSURE    Inspiratory PAP 14.0    Expiratory PAP 5.0    pH, Arterial 7.36 7.350 - 7.450   pCO2 arterial 44 32.0 - 48.0 mmHg   pO2, Arterial 101 83.0 - 108.0 mmHg   Bicarbonate 24.9 20.0 - 28.0 mmol/L   Acid-base deficit 0.8 0.0 - 2.0 mmol/L   O2 Saturation 97.6 %   Patient temperature 37.0    Collection site LEFT RADIAL    Sample type ARTERIAL DRAW    Allens test (pass/fail) PASS PASS    Comment: Performed at [facility], [address] [place], [place] [address]  Urinalysis, Complete w Microscopic     Status: Abnormal    Collection Time: 02/02/21  3:07 PM  Result Value Ref Range   Color, Urine STRAW (A) YELLOW   APPearance CLEAR (A) CLEAR   Specific Gravity, Urine 1.010 1.005 - 1.030   pH 6.0 5.0 - 8.0   Glucose, UA NEGATIVE NEGATIVE mg/dL   Hgb urine dipstick SMALL (A) NEGATIVE   Bilirubin Urine NEGATIVE NEGATIVE   Ketones, ur NEGATIVE NEGATIVE mg/dL   Protein, ur NEGATIVE NEGATIVE mg/dL   Nitrite NEGATIVE NEGATIVE   Leukocytes,Ua NEGATIVE NEGATIVE   RBC / HPF 0-5 0 - 5 RBC/hpf   WBC, UA NONE SEEN 0 - 5 WBC/hpf   Bacteria, UA NONE SEEN NONE SEEN   Squamous Epithelial / LPF 0-5 0 - 5   Mucus PRESENT     Comment: Performed at [facility], [place], [place] [address]  Procalcitonin - Baseline     Status: None   Collection Time: 02/02/21  3:07 PM  Result Value Ref Range   Procalcitonin <0.10 ng/mL    Comment:        Interpretation: PCT (Procalcitonin) <= 0.5 ng/mL: Systemic infection (sepsis) is not likely. Local bacterial infection is possible. (NOTE)       Sepsis PCT Algorithm           Lower Respiratory Tract                                      Infection PCT Algorithm    ----------------------------     ----------------------------         PCT < 0.25 ng/mL                PCT < 0.10 ng/mL          Strongly encourage             Strongly discourage   discontinuation of antibiotics    initiation of antibiotics    ----------------------------     -----------------------------       PCT 0.25 - 0.50 ng/mL            PCT 0.10 - 0.25 ng/mL  OR       >80% decrease in PCT            Discourage initiation of                                            antibiotics      Encourage discontinuation           of antibiotics    ----------------------------     -----------------------------         PCT >= 0.50 ng/mL              PCT 0.26 - 0.50 ng/mL               AND        <80% decrease in PCT             Encourage initiation of                                              antibiotics       Encourage continuation           of antibiotics    ----------------------------     -----------------------------        PCT >= 0.50 ng/mL                  PCT > 0.50 ng/mL               AND         increase in PCT                  Strongly encourage                                      initiation of antibiotics    Strongly encourage escalation           of antibiotics                                     -----------------------------                                           PCT <= 0.25 ng/mL                                                 OR                                        > 80% decrease in PCT                                      Discontinue / Do not initiate  antibiotics  Performed at [facility], [address] [place], [place] [id number]   Basic metabolic panel     Status: Abnormal   Collection Time: 02/04/21  5:13 AM  Result Value Ref Range   Sodium 137 135 - 145 mmol/L   Potassium 4.6 3.5 - 5.1 mmol/L   Chloride 101 98 - 111 mmol/L   CO2 28 22 - 32 mmol/L   Glucose, Bld 96 70 - 99 mg/dL    Comment: Glucose reference range applies only to samples taken after fasting for at least 8 hours.   BUN 17 8 - 23 mg/dL   Creatinine, Ser 0.72 0.44 - 1.00 mg/dL   Calcium 8.7 (L) 8.9 - 10.3 mg/dL   GFR, Estimated >60 >60 mL/min    Comment: (NOTE) Calculated using the CKD-EPI Creatinine Equation (2021)    Anion gap 8 5 - 15    Comment: Performed at [facility], [address] [place], [place] [address]  CBC     Status: None   Collection Time: 02/04/21  5:13 AM  Result Value Ref Range   WBC 10.4 4.0 - 10.5 K/uL   RBC 4.12 3.87 - 5.11 MIL/uL   Hemoglobin 12.3 12.0 - 15.0 g/dL   HCT 37.1 36.0 - 46.0 %   MCV 90.0 80.0 - 100.0 fL   MCH 29.9 26.0 - 34.0 pg   MCHC 33.2 30.0 - 36.0 g/dL   RDW 14.4 11.5 - 15.5 %   Platelets 247 150 - 400 K/uL   nRBC 0.0 0.0 - 0.2 %    Comment:  Performed at [facility] [address] [place], [place] [address]  Magnesium     Status: None   Collection Time: 02/04/21  5:13 AM  Result Value Ref Range   Magnesium 2.3 1.7 - 2.4 mg/dL    Comment: Performed at [facility], [address] [place], [place] [id number]  Basic metabolic panel     Status: Abnormal   Collection Time: 02/05/21  4:04 AM  Result Value Ref Range   Sodium 135 135 - 145 mmol/L   Potassium 4.2 3.5 - 5.1 mmol/L   Chloride 100 98 - 111 mmol/L   CO2 26 22 - 32 mmol/L   Glucose, Bld 85 70 - 99 mg/dL    Comment: Glucose reference range applies only to samples taken after fasting for at least 8 hours.   BUN 22 8 - 23 mg/dL   Creatinine, Ser 0.71 0.44 - 1.00 mg/dL   Calcium 8.8 (L) 8.9 - 10.3 mg/dL   GFR, Estimated >60 >60 mL/min    Comment: (NOTE) Calculated using the CKD-EPI Creatinine Equation (2021)    Anion gap 9 5 - 15    Comment: Performed at [facility], [address] [place], [place] [address]  CBC     Status: Abnormal   Collection Time: 02/05/21  4:04 AM  Result Value Ref Range   WBC 10.6 (H) 4.0 - 10.5 K/uL   RBC 4.30 3.87 - 5.11 MIL/uL   Hemoglobin 12.6 12.0 - 15.0 g/dL   HCT 38.3 36.0 - 46.0 %   MCV 89.1 80.0 - 100.0 fL   MCH 29.3 26.0 - 34.0 pg   MCHC 32.9 30.0 - 36.0 g/dL   RDW 14.3 11.5 - 15.5 %   Platelets 250 150 - 400 K/uL   nRBC 0.0 0.0 - 0.2 %    Comment: Performed at [facility], [address] [place],  [place] [address]  Magnesium     Status: None   Collection Time: 02/05/21  4:04 AM  Result Value Ref Range   Magnesium 2.1 1.7 - 2.4 mg/dL    Comment: Performed at [facility], [address] [place], [place] [id number]  Basic metabolic panel     Status: Abnormal   Collection Time: 02/06/21  4:14 AM  Result Value Ref Range   Sodium 134 (L) 135 - 145 mmol/L   Potassium 4.5 3.5 - 5.1 mmol/L   Chloride 101 98 - 111 mmol/L   CO2 27 22 - 32 mmol/L   Glucose, Bld 84 70 - 99  mg/dL    Comment: Glucose reference range applies only to samples taken after fasting for at least 8 hours.   BUN 25 (H) 8 - 23 mg/dL   Creatinine, Ser 0.72 0.44 - 1.00 mg/dL   Calcium 8.9 8.9 - 10.3 mg/dL   GFR, Estimated >60 >60 mL/min    Comment: (NOTE) Calculated using the CKD-EPI Creatinine Equation (2021)    Anion gap 6 5 - 15    Comment: Performed at [facility], [address] [place], [place] [address]  CBC     Status: Abnormal   Collection Time: 02/06/21  4:14 AM  Result Value Ref Range   WBC 11.1 (H) 4.0 - 10.5 K/uL   RBC 4.19 3.87 - 5.11 MIL/uL   Hemoglobin 12.6 12.0 - 15.0 g/dL   HCT 37.2 36.0 - 46.0 %   MCV 88.8 80.0 - 100.0 fL   MCH 30.1 26.0 - 34.0 pg   MCHC 33.9 30.0 - 36.0 g/dL   RDW 14.3 11.5 - 15.5 %   Platelets 279 150 - 400 K/uL   nRBC 0.0 0.0 - 0.2 %    Comment: Performed at [facility], [address], [place], [place] [address]  Magnesium     Status: None   Collection Time: 02/06/21  4:14 AM  Result Value Ref Range   Magnesium 2.3 1.7 - 2.4 mg/dL    Comment: Performed at [facility], [address] [place], [place] [address]    Diabetic Foot Exam: Diabetic Foot Exam - Simple   No data filed    ***  PHQ2/9: Depression screen [facility] 2/9 08/12/2020 07/09/2020 02/21/2020 09/17/2019 07/09/2019  Decreased Interest 1 0 0 0 0  Down, Depressed, Hopeless 0 0 0 0 0  PHQ - 2 Score 1 0 0 0 0  Altered sleeping 0 - 0 0 -  Tired, decreased energy 1 - 1 0 -  Change in appetite 0 - 0 0 -  Feeling bad or failure about yourself  0 - 0 0 -  Trouble concentrating 0 - 0 0 -  Moving slowly or fidgety/restless 0 - 0 0 -  Suicidal thoughts 0 - 0 0 -  PHQ-9 Score 2 - 1 0 -  Difficult doing work/chores - - Not difficult at all Not difficult at all -  Some recent data might be hidden    phq 9 is {gen pos [id number] ***  Fall Risk: Fall Risk  08/12/2020 07/09/2020 02/21/2020 09/17/2019 07/09/2019  Falls in the past year? 0 0 0 0 1  Number falls  in past yr: 0 0 0 0 1  Injury with Fall? 0 0 0 0 1  Comment - - - - -  Risk for fall due to : - Impaired balance/gait - - History of fall(s);Impaired vision;Orthopedic patient  Risk for fall due  to: Comment - - - - -  Follow up - Falls prevention discussed - Falls evaluation completed Falls prevention discussed   ***   Functional Status Survey:   ***   Assessment & Plan  *** There are no diagnoses linked to this encounter.

## 2021-02-19 ENCOUNTER — Inpatient Hospital Stay
Admission: EM | Admit: 2021-02-19 | Discharge: 2021-02-26 | DRG: 246 | Disposition: A | Discharge status: Home Health | Payer: Medicare Other | Attending: Internal Medicine | Admitting: Internal Medicine

## 2021-02-19 ENCOUNTER — Emergency Department: Payer: Medicare Other

## 2021-02-19 ENCOUNTER — Ambulatory Visit: Payer: Medicare Other | Admitting: Family Medicine

## 2021-02-19 DIAGNOSIS — Z823 Family history of stroke: Secondary | ICD-10-CM | POA: Diagnosis not present

## 2021-02-19 DIAGNOSIS — J441 Chronic obstructive pulmonary disease with (acute) exacerbation: Secondary | ICD-10-CM | POA: Diagnosis present

## 2021-02-19 DIAGNOSIS — R531 Weakness: Secondary | ICD-10-CM | POA: Diagnosis present

## 2021-02-19 DIAGNOSIS — F329 Major depressive disorder, single episode, unspecified: Secondary | ICD-10-CM | POA: Diagnosis present

## 2021-02-19 DIAGNOSIS — K219 Gastro-esophageal reflux disease without esophagitis: Secondary | ICD-10-CM | POA: Diagnosis present

## 2021-02-19 DIAGNOSIS — E785 Hyperlipidemia, unspecified: Secondary | ICD-10-CM | POA: Diagnosis present

## 2021-02-19 DIAGNOSIS — R42 Dizziness and giddiness: Secondary | ICD-10-CM | POA: Diagnosis not present

## 2021-02-19 DIAGNOSIS — I959 Hypotension, unspecified: Secondary | ICD-10-CM | POA: Diagnosis not present

## 2021-02-19 DIAGNOSIS — I429 Cardiomyopathy, unspecified: Secondary | ICD-10-CM | POA: Diagnosis present

## 2021-02-19 DIAGNOSIS — I252 Old myocardial infarction: Secondary | ICD-10-CM

## 2021-02-19 DIAGNOSIS — J439 Emphysema, unspecified: Secondary | ICD-10-CM | POA: Diagnosis present

## 2021-02-19 DIAGNOSIS — F17210 Nicotine dependence, cigarettes, uncomplicated: Secondary | ICD-10-CM | POA: Diagnosis present

## 2021-02-19 DIAGNOSIS — R0602 Shortness of breath: Secondary | ICD-10-CM | POA: Diagnosis not present

## 2021-02-19 DIAGNOSIS — I5033 Acute on chronic diastolic (congestive) heart failure: Secondary | ICD-10-CM | POA: Diagnosis not present

## 2021-02-19 DIAGNOSIS — Z8249 Family history of ischemic heart disease and other diseases of the circulatory system: Secondary | ICD-10-CM | POA: Diagnosis not present

## 2021-02-19 DIAGNOSIS — Z79899 Other long term (current) drug therapy: Secondary | ICD-10-CM | POA: Diagnosis not present

## 2021-02-19 DIAGNOSIS — I110 Hypertensive heart disease with heart failure: Secondary | ICD-10-CM | POA: Diagnosis present

## 2021-02-19 DIAGNOSIS — I214 Non-ST elevation (NSTEMI) myocardial infarction: Secondary | ICD-10-CM | POA: Diagnosis present

## 2021-02-19 DIAGNOSIS — D72829 Elevated white blood cell count, unspecified: Secondary | ICD-10-CM | POA: Diagnosis present

## 2021-02-19 DIAGNOSIS — Z833 Family history of diabetes mellitus: Secondary | ICD-10-CM

## 2021-02-19 DIAGNOSIS — I10 Essential (primary) hypertension: Secondary | ICD-10-CM | POA: Diagnosis not present

## 2021-02-19 DIAGNOSIS — R0603 Acute respiratory distress: Secondary | ICD-10-CM | POA: Diagnosis not present

## 2021-02-19 DIAGNOSIS — I2510 Atherosclerotic heart disease of native coronary artery without angina pectoris: Secondary | ICD-10-CM | POA: Diagnosis present

## 2021-02-19 DIAGNOSIS — Z85828 Personal history of other malignant neoplasm of skin: Secondary | ICD-10-CM

## 2021-02-19 DIAGNOSIS — Z7951 Long term (current) use of inhaled steroids: Secondary | ICD-10-CM | POA: Diagnosis not present

## 2021-02-19 DIAGNOSIS — Z20822 Contact with and (suspected) exposure to covid-19: Secondary | ICD-10-CM | POA: Diagnosis present

## 2021-02-19 DIAGNOSIS — Z7982 Long term (current) use of aspirin: Secondary | ICD-10-CM | POA: Diagnosis not present

## 2021-02-19 DIAGNOSIS — F419 Anxiety disorder, unspecified: Secondary | ICD-10-CM | POA: Diagnosis present

## 2021-02-19 DIAGNOSIS — R0689 Other abnormalities of breathing: Secondary | ICD-10-CM | POA: Diagnosis not present

## 2021-02-19 DIAGNOSIS — R001 Bradycardia, unspecified: Secondary | ICD-10-CM | POA: Diagnosis not present

## 2021-02-19 DIAGNOSIS — R062 Wheezing: Secondary | ICD-10-CM | POA: Diagnosis not present

## 2021-02-19 DIAGNOSIS — J9601 Acute respiratory failure with hypoxia: Secondary | ICD-10-CM | POA: Diagnosis not present

## 2021-02-19 DIAGNOSIS — R0609 Other forms of dyspnea: Secondary | ICD-10-CM | POA: Diagnosis not present

## 2021-02-19 DIAGNOSIS — Z720 Tobacco use: Secondary | ICD-10-CM | POA: Diagnosis not present

## 2021-02-19 DIAGNOSIS — S12110D Anterior displaced Type II dens fracture, subsequent encounter for fracture with routine healing: Secondary | ICD-10-CM

## 2021-02-19 DIAGNOSIS — J449 Chronic obstructive pulmonary disease, unspecified: Secondary | ICD-10-CM | POA: Diagnosis not present

## 2021-02-19 LAB — BLOOD GAS, VENOUS
Acid-base deficit: 3.6 mmol/L — ABNORMAL HIGH (ref 0.0–2.0)
Bicarbonate: 22.7 mmol/L (ref 20.0–28.0)
Delivery systems: POSITIVE
FIO2: 0.35
Mechanical Rate: 12
O2 Saturation: 88 %
Patient temperature: 37
pCO2, Ven: 45 mmHg (ref 44.0–60.0)
pH, Ven: 7.31 (ref 7.250–7.430)
pO2, Ven: 60 mmHg — ABNORMAL HIGH (ref 32.0–45.0)

## 2021-02-19 LAB — CBC
HCT: 37.2 % (ref 36.0–46.0)
HCT: 37.9 % (ref 36.0–46.0)
Hemoglobin: 12.4 g/dL (ref 12.0–15.0)
Hemoglobin: 12.4 g/dL (ref 12.0–15.0)
MCH: 30.5 pg (ref 26.0–34.0)
MCH: 29.6 pg (ref 26.0–34.0)
MCHC: 33.3 g/dL (ref 30.0–36.0)
MCHC: 32.7 g/dL (ref 30.0–36.0)
MCV: 91.4 fL (ref 80.0–100.0)
MCV: 90.5 fL (ref 80.0–100.0)
Platelets: 233 10*3/uL (ref 150–400)
Platelets: 208 10*3/uL (ref 150–400)
RBC: 4.07 MIL/uL (ref 3.87–5.11)
RBC: 4.19 MIL/uL (ref 3.87–5.11)
RDW: 14.5 % (ref 11.5–15.5)
RDW: 14.3 % (ref 11.5–15.5)
WBC: 12.2 10*3/uL — ABNORMAL HIGH (ref 4.0–10.5)
WBC: 11 10*3/uL — ABNORMAL HIGH (ref 4.0–10.5)
nRBC: 0 % (ref 0.0–0.2)
nRBC: 0 % (ref 0.0–0.2)

## 2021-02-19 LAB — COMPREHENSIVE METABOLIC PANEL
ALT: 52 U/L — ABNORMAL HIGH (ref 0–44)
AST: 59 U/L — ABNORMAL HIGH (ref 15–41)
Albumin: 3.7 g/dL (ref 3.5–5.0)
Alkaline Phosphatase: 55 U/L (ref 38–126)
Anion gap: 5 (ref 5–15)
BUN: 21 mg/dL (ref 8–23)
CO2: 25 mmol/L (ref 22–32)
Calcium: 8.4 mg/dL — ABNORMAL LOW (ref 8.9–10.3)
Chloride: 106 mmol/L (ref 98–111)
Creatinine, Ser: 0.7 mg/dL (ref 0.44–1.00)
GFR, Estimated: >60 mL/min (ref >60)
Glucose, Bld: 116 mg/dL — ABNORMAL HIGH (ref 70–99)
Potassium: 4.5 mmol/L (ref 3.5–5.1)
Sodium: 136 mmol/L (ref 135–145)
Total Bilirubin: 0.4 mg/dL (ref 0.3–1.2)
Total Protein: 6.8 g/dL (ref 6.5–8.1)

## 2021-02-19 LAB — BRAIN NATRIURETIC PEPTIDE: B Natriuretic Peptide: 152.9 pg/mL — ABNORMAL HIGH (ref 0.0–100.0)

## 2021-02-19 LAB — TROPONIN I (HIGH SENSITIVITY): Troponin I (High Sensitivity): 50 ng/L — ABNORMAL HIGH (ref <18)

## 2021-02-19 MED ORDER — MORPHINE SULFATE (PF) 2 MG/ML IV SOLN
2.0000 mg | INTRAVENOUS | Status: DC | PRN
  Administered 2021-02-21 – 2021-02-23 (×2): 2 mg via INTRAVENOUS
  Filled 2021-02-19 (×2): qty 1

## 2021-02-19 MED ORDER — IPRATROPIUM-ALBUTEROL 0.5-2.5 (3) MG/3ML IN SOLN
3.0000 mL | Freq: Four times a day (QID) | RESPIRATORY_TRACT | Status: DC
  Administered 2021-02-19 – 2021-02-20 (×5): 3 mL via RESPIRATORY_TRACT
  Filled 2021-02-19 (×5): qty 3

## 2021-02-19 MED ORDER — AMLODIPINE BESYLATE 5 MG PO TABS
5.0000 mg | ORAL_TABLET | Freq: Every day | ORAL | Status: DC
  Administered 2021-02-20 – 2021-02-25 (×6): 5 mg via ORAL
  Filled 2021-02-19 (×6): qty 1

## 2021-02-19 MED ORDER — CITALOPRAM HYDROBROMIDE 20 MG PO TABS
20.0000 mg | ORAL_TABLET | Freq: Every evening | ORAL | Status: DC
  Administered 2021-02-19 – 2021-02-25 (×6): 20 mg via ORAL
  Filled 2021-02-19 (×6): qty 1

## 2021-02-19 MED ORDER — DOXYCYCLINE HYCLATE 100 MG PO TABS
100.0000 mg | ORAL_TABLET | Freq: Two times a day (BID) | ORAL | Status: AC
  Administered 2021-02-19 – 2021-02-23 (×10): 100 mg via ORAL
  Filled 2021-02-19 (×10): qty 1

## 2021-02-19 MED ORDER — ONDANSETRON HCL 4 MG/2ML IJ SOLN: 4.0000 mg | Freq: Four times a day (QID) | INTRAMUSCULAR | Status: DC | PRN

## 2021-02-19 MED ORDER — ONDANSETRON HCL 4 MG PO TABS: 4.0000 mg | ORAL_TABLET | Freq: Four times a day (QID) | ORAL | Status: DC | PRN

## 2021-02-19 MED ORDER — HYDROCODONE-ACETAMINOPHEN 5-325 MG PO TABS
1.0000 | ORAL_TABLET | ORAL | Status: DC | PRN
  Administered 2021-02-20: 1 via ORAL
  Administered 2021-02-20 – 2021-02-24 (×8): 2 via ORAL
  Filled 2021-02-19 (×3): qty 2
  Filled 2021-02-19: qty 1
  Filled 2021-02-19 (×4): qty 2

## 2021-02-19 MED ORDER — PREDNISONE 20 MG PO TABS
40.0000 mg | ORAL_TABLET | Freq: Every day | ORAL | Status: DC
  Administered 2021-02-23 – 2021-02-24 (×2): 40 mg via ORAL
  Filled 2021-02-19 (×2): qty 2

## 2021-02-19 MED ORDER — ACETAMINOPHEN 325 MG PO TABS: 650.0000 mg | ORAL_TABLET | Freq: Four times a day (QID) | ORAL | Status: DC | PRN

## 2021-02-19 MED ORDER — METOPROLOL SUCCINATE ER 25 MG PO TB24
25.0000 mg | ORAL_TABLET | Freq: Every evening | ORAL | Status: DC
  Administered 2021-02-19 – 2021-02-25 (×6): 25 mg via ORAL
  Filled 2021-02-19 (×6): qty 1

## 2021-02-19 MED ORDER — IPRATROPIUM-ALBUTEROL 0.5-2.5 (3) MG/3ML IN SOLN
3.0000 mL | RESPIRATORY_TRACT | Status: DC | PRN
  Administered 2021-02-21 – 2021-02-22 (×2): 3 mL via RESPIRATORY_TRACT
  Filled 2021-02-19 (×2): qty 3

## 2021-02-19 MED ORDER — NICOTINE 7 MG/24HR TD PT24
7.0000 mg | MEDICATED_PATCH | Freq: Every day | TRANSDERMAL | Status: DC
  Administered 2021-02-20 – 2021-02-26 (×6): 7 mg via TRANSDERMAL
  Filled 2021-02-19 (×10): qty 1

## 2021-02-19 MED ORDER — METHYLPREDNISOLONE SODIUM SUCC 40 MG IJ SOLR
40.0000 mg | Freq: Two times a day (BID) | INTRAMUSCULAR | Status: AC
  Administered 2021-02-19 – 2021-02-22 (×6): 40 mg via INTRAVENOUS
  Filled 2021-02-19 (×6): qty 1

## 2021-02-19 MED ORDER — ACETAMINOPHEN 650 MG RE SUPP: 650.0000 mg | Freq: Four times a day (QID) | RECTAL | Status: DC | PRN

## 2021-02-19 MED ORDER — PANTOPRAZOLE SODIUM 40 MG PO TBEC
40.0000 mg | DELAYED_RELEASE_TABLET | Freq: Every evening | ORAL | Status: DC
  Administered 2021-02-19 – 2021-02-25 (×6): 40 mg via ORAL
  Filled 2021-02-19 (×6): qty 1

## 2021-02-19 MED ORDER — ATORVASTATIN CALCIUM 20 MG PO TABS
20.0000 mg | ORAL_TABLET | Freq: Every day | ORAL | Status: DC
  Administered 2021-02-20 – 2021-02-25 (×6): 20 mg via ORAL
  Filled 2021-02-19 (×6): qty 1

## 2021-02-19 MED ORDER — FLUTICASONE-UMECLIDIN-VILANT 100-62.5-25 MCG/INH IN AEPB: INHALATION_SPRAY | Freq: Every day | RESPIRATORY_TRACT | Status: DC

## 2021-02-19 MED ORDER — LORATADINE 10 MG PO TABS
10.0000 mg | ORAL_TABLET | Freq: Every day | ORAL | Status: DC
  Administered 2021-02-20 – 2021-02-26 (×7): 10 mg via ORAL
  Filled 2021-02-19 (×7): qty 1

## 2021-02-19 MED ORDER — IPRATROPIUM-ALBUTEROL 0.5-2.5 (3) MG/3ML IN SOLN
3.0000 mL | Freq: Once | RESPIRATORY_TRACT | Status: AC
  Administered 2021-02-19: 3 mL via RESPIRATORY_TRACT
  Filled 2021-02-19: qty 3

## 2021-02-19 MED ORDER — ENOXAPARIN SODIUM 40 MG/0.4ML IJ SOSY
40.0000 mg | PREFILLED_SYRINGE | INTRAMUSCULAR | Status: DC
  Administered 2021-02-19 – 2021-02-20 (×2): 40 mg via SUBCUTANEOUS
  Filled 2021-02-19 (×2): qty 0.4

## 2021-02-19 MED ORDER — LOSARTAN POTASSIUM 50 MG PO TABS
100.0000 mg | ORAL_TABLET | Freq: Every evening | ORAL | Status: DC
  Administered 2021-02-19 – 2021-02-25 (×6): 100 mg via ORAL
  Filled 2021-02-19 (×6): qty 2

## 2021-02-19 MED ORDER — ASPIRIN EC 81 MG PO TBEC
81.0000 mg | DELAYED_RELEASE_TABLET | Freq: Every day | ORAL | Status: DC
  Administered 2021-02-20 – 2021-02-26 (×7): 81 mg via ORAL
  Filled 2021-02-19 (×9): qty 1

## 2021-02-19 MED ORDER — FLUTICASONE PROPIONATE 50 MCG/ACT NA SUSP
2.0000 | Freq: Every day | NASAL | Status: DC | PRN
  Filled 2021-02-19: qty 16

## 2021-02-19 MED ORDER — MAGNESIUM SULFATE 2 GM/50ML IV SOLN
2.0000 g | Freq: Once | INTRAVENOUS | Status: AC
  Administered 2021-02-19: 2 g via INTRAVENOUS
  Filled 2021-02-19: qty 50

## 2021-02-19 NOTE — ED Provider Notes (Signed)
[facility] Emergency Department Provider Note   ____________________________________________    I have reviewed the triage vital signs and the nursing notes.   HISTORY  Chief Complaint Respiratory Distress     HPI Terri Burns is a 70 y.o. female with history of COPD who presents with shortness of breath.  History is limited as the patient presented with EMS with CPAP applied.  She does confirm a history of COPD.  Reportedly room air sats in the 50s, not on home oxygen.  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    Hyperlipidemia    Hypertension    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    Patient Active Problem List   Diagnosis Date Noted   Respiratory distress    Depression 09/13/2020   COPD exacerbation ([place]) 09/13/2020   Acute respiratory failure with hypoxia ([place]) 09/13/2020   HLD (hyperlipidemia) 09/13/2020   Skin lesion of face 05/01/2018   Atherosclerosis of aorta ([place]) 03/04/2018   Coronary artery calcification of native artery 03/04/2018   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   Chronic neck pain 01/01/2018   Benign essential HTN 01/01/2018   Senile purpura ([place]) 01/01/2018   Recurrent major depressive disorder, in full remission ([place]) 01/01/2018   Hyperglycemia 09/27/2016   Tobacco abuse 08/20/2015   Allergic rhinitis, seasonal 05/12/2015   Emphysema lung ([place]) 05/12/2015   Degeneration of intervertebral disc of cervical region 05/12/2015   GERD (gastroesophageal reflux disease) 05/12/2015   Major depression in remission ([place]) 05/12/2015    Past Surgical History:  Procedure Laterality Date   NECK SURGERY N/A 10/2003   SHOULDER SURGERY Left 2004     Prior to Admission medications   Medication Sig Start Date End Date Taking? Authorizing Provider  albuterol (VENTOLIN HFA) 108 (90 Base) MCG/ACT inhaler Inhale 1-2 puffs into the lungs every 6 (six) hours as needed for wheezing or shortness of breath.    [provider]  ALLERGY RELIEF 10 MG tablet TAKE ONE TABLET BY MOUTH BEFORE BREAKFAST Patient taking differently: Take 10 mg by mouth daily. 12/08/20   Steele Sizer, MD  amLODipine (NORVASC) 5 MG tablet TAKE ONE TABLET BY MOUTH BEFORE BREAKFAST 02/12/21   Steele Sizer, MD  aspirin 81 MG EC tablet TAKE 1 TABLET BY MOUTH EVERY DAY 04/22/19   Ancil Boozer, Drue Stager, MD  atorvastatin (LIPITOR) 20 MG tablet TAKE ONE TABLET BY MOUTH BEFORE BREAKFAST 12/08/20   Ancil Boozer, Drue Stager, MD  citalopram (CELEXA) 20 MG tablet TAKE ONE TABLET BY MOUTH EVERY EVENING 12/08/20   Ancil Boozer, Drue Stager, MD  fluticasone (FLONASE) 50 MCG/ACT nasal spray Place 2 sprays into both nostrils daily. 01/06/20   Steele Sizer, MD  ipratropium-albuterol (DUONEB) 0.5-2.5 (3) MG/3ML SOLN Inhale 3 mLs into the lungs every 4 (four) hours as needed. 12/18/20   Steele Sizer, MD  losartan (COZAAR) 100 MG tablet TAKE ONE TABLET BY MOUTH EVERY EVENING 12/08/20   Steele Sizer, MD  metoprolol succinate (TOPROL-XL) 25 MG 24 hr tablet TAKE ONE TABLET BY MOUTH EVERY EVENING 12/08/20   Ancil Boozer, Drue Stager, MD  pantoprazole (PROTONIX) 40 MG tablet TAKE ONE TABLET BY MOUTH EVERY EVENING 12/08/20   Steele Sizer, MD  TRELEGY ELLIPTA 100-62.5-25 MCG/INH AEPB  INHALE ONE PUFF BY MOUTH into THE lungs daily 02/02/21   Steele Sizer, MD     Allergies Levofloxacin, Penicillins, Meloxicam, and Nsaids  Family History  Problem Relation Age of Onset   Hypertension Mother    Stroke Mother    Heart disease Father    Diabetes Father    Heart disease Daughter    Pulmonary embolism Daughter    Hypertension Son     Social History Social History   Tobacco Use   Smoking status: Every Day    Packs/day:  0.25    Years: 45.00    Pack years: 11.25    Types: Cigarettes    Start date: 12/10/1973   Smokeless tobacco: Never   Tobacco comments:    4 cigarettes daily   Vaping Use   Vaping Use: Never used  Substance Use Topics   Alcohol use: Yes    Alcohol/week: 0.0 standard drinks    Comment: occasionally   Drug use: No    Review of Systems limited due to patient distress  Constitutional: No fever  ENT: No throat swelling Cardiovascular: Positive tightness Respiratory: Positive shortness of breath    ____________________________________________   PHYSICAL EXAM:  VITAL SIGNS: ED Triage Vitals [02/19/21 1207]  Enc Vitals Group     BP      Pulse      Resp      Temp      Temp src      SpO2 100 %     Weight      Height      Head Circumference      Peak Flow      Pain Score      Pain Loc      Pain Edu?      Excl. in [place]?     Constitutional: CPAP in place, increased work of breathing  Nose: No congestion/rhinnorhea. Mouth/Throat: Mucous membranes are moist.    Cardiovascular: Normal rate, regular rhythm. Grossly normal heart sounds.  Good peripheral circulation. Respiratory: Increased respiratory effort with tachypnea, retractions, CPAP in place, poor airflow, scattered wheezing Gastrointestinal: Soft and nontender.  Musculoskeletal:  Warm and well perfused Neurologic:  Normal speech and language. No gross focal neurologic deficits are appreciated.  Skin:  Skin is warm, dry and intact. No rash noted. Psychiatric: Mood and affect are normal. Speech and behavior are normal.  ____________________________________________   LABS (all labs ordered are listed, but only abnormal results are displayed)  Labs Reviewed  CBC - Abnormal; Notable for the following components:      Result Value   WBC 12.2 (*)    All other components within normal limits  COMPREHENSIVE METABOLIC PANEL - Abnormal; Notable for the following components:   Glucose, Bld 116 (*)    Calcium 8.4  (*)    AST 59 (*)    ALT 52 (*)    All other components within normal limits  BRAIN NATRIURETIC PEPTIDE - Abnormal; Notable for the following components:   B Natriuretic Peptide 152.9 (*)    All other components within normal limits  BLOOD GAS, VENOUS - Abnormal; Notable for the following components:   pO2, Ven 60.0 (*)    Acid-base deficit 3.6 (*)    All other components within normal limits  TROPONIN I (HIGH SENSITIVITY) - Abnormal; Notable for the following components:   Troponin I (High Sensitivity) 50 (*)    All other components within normal limits   ____________________________________________  EKG  ED ECG REPORT I, Herbie [place]  Stefan Karen, the attending physician, personally viewed and interpreted this ECG.  Date: 02/19/2021  Rhythm: normal sinus rhythm QRS Axis: normal Intervals: normal ST/T Wave abnormalities: normal Narrative Interpretation: no evidence of acute ischemia  ____________________________________________  RADIOLOGY  Chest x-ray reviewed by me, no pneumothorax or infiltrate ____________________________________________   PROCEDURES  Procedure(s) performed: No  Procedures   Critical Care performed: yes  CRITICAL CARE Performed by: Lavonia Drafts   Total critical care time:30 minutes  Critical care time was exclusive of separately billable procedures and treating other patients.  Critical care was necessary to treat or prevent imminent or life-threatening deterioration.  Critical care was time spent personally by me on the following activities: development of treatment plan with patient and/or surrogate as well as nursing, discussions with consultants, evaluation of patient's response to treatment, examination of patient, obtaining history from patient or surrogate, ordering and performing treatments and interventions, ordering and review of laboratory studies, ordering and review of radiographic studies, pulse oximetry and re-evaluation of patient's  condition.  ____________________________________________   INITIAL IMPRESSION / ASSESSMENT AND PLAN / ED COURSE  Pertinent labs & imaging results that were available during my care of the patient were reviewed by me and considered in my medical decision making (see chart for details).   Patient with history of COPD presents with respiratory distress, room air saturations in the 50s on EMS arrival, improved here, transition from CPAP to BiPAP upon arrival.  Will give IV magnesium, additional DuoNeb's via BiPAP, obtain labs chest x-ray placed in the cardiac monitor and closely monitor.   ----------------------------------------- 1:31 PM on 02/19/2021 ----------------------------------------- Patient significantly improved on reexam, will trial nasal cannula oxygen  Noted elevated troponin likely related to increased work of breathing  Will discuss with the hospitalist for admission    ____________________________________________   FINAL CLINICAL IMPRESSION(S) / ED DIAGNOSES  Final diagnoses:  Acute respiratory failure with hypoxia ([employer])  COPD exacerbation ([place])        Note:  This document was prepared using Dragon voice recognition software and may include unintentional dictation errors.    Lavonia Drafts, MD 02/19/21 1332

## 2021-02-19 NOTE — ED Triage Notes (Signed)
Per EMS, Pt, from home, c/o respiratory distress starting this morning and headache.  Pain score 4/10.  No Hx home O2.  Pt initially in the 50s RA.     Hx COPD, Asthma, GERD, HTN, and Depression.  2 Duo nebs prior to transport.  2 Duo nebs and '125mg'$  Solu-Medrol given en route.

## 2021-02-19 NOTE — ED Notes (Signed)
Pt taken off bipap and placed on 2L [place].  Pt tolerating well with sats at 99%

## 2021-02-19 NOTE — H&P (Addendum)
History and Physical    Terri Burns [id number] DOB: January 13, 1951 DOA: 02/19/2021  PCP: Steele Sizer, MD  Chief Complaint: Shortness of breath  HPI: Terri Burns is a 70 y.o. female with a past medical history of COPD not on home oxygen, hypertension, GERD, depression, dyslipidemia, tobacco dependence currently smoking 1/4 packs/day.  This patient presented to the emergency department with shortness of breath/respiratory distress that started 8:00 this morning.  She was noted to be tachypneic and wheezing.  When EMS arrived she was 50% on room air.  Given 2 DuoNeb breathing treatments prior to transport.  In route she was given 2 duo nebs +125 mg of Solu-Medrol.  She was placed on CPAP.  In the emergency department she was placed on BiPAP.  Given an additional DuoNeb breathing treatment and magnesium sulfate 2 g.  Upon my evaluation she is now on 2 L and saturating well.  No longer in any respiratory distress.  Daughter-in-law is also present at bedside.  She has been taken off BiPAP as mentioned.  She states her shortness of breath was worse with exertion.  Better with rest.  She denies any fevers or chills.  She denies any cough.  ED Course: Given DuoNeb breathing treatment x1 and magnesium sulfate 2 g IV.  Lab work was obtained which showed a mild leukocytosis of 12,000.  No electrolyte derangements.  Liver enzymes are mildly elevated.  Venous blood gas on BiPAP was reassuring with no hypercarbia or acidosis noted.  BNP was unremarkable.  Chest x-ray showed no infiltrate or consolidation consistent with pneumonia.  Review of Systems: 14 point review of systems is negative except for what is mentioned above in the HPI.   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    Hyperlipidemia     Hypertension    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    Past Surgical History:  Procedure Laterality Date   NECK SURGERY N/A 10/2003   SHOULDER SURGERY Left 2004    Social History   Socioeconomic History   Marital status: Widowed    Spouse name: Not on file   Number of children: 3   Years of education: Not on file   Highest education level: Some college, no degree  Occupational History   Occupation: disabled     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   Her daughter died due to heart issues on 11/08/2018 at the age of 79.   Both sons live in town and are helping her out  Social Determinants of Health   Financial Resource Strain: Low Risk    Difficulty of Paying Living Expenses: Not hard at all  Food Insecurity: No Food Insecurity   Worried About Charity fundraiser in the Last Year: Never true   [place] in the Last Year: Never true  Transportation Needs: No Transportation Needs   Lack of Transportation (Medical): No   Lack of Transportation (Non-Medical): No  Physical Activity: Inactive   Days of Exercise per Week: 0 days   Minutes of Exercise per Session: 0 min  Stress: No Stress Concern Present   Feeling of Stress : Not at all  Social Connections: Moderately Integrated   Frequency of Communication with Friends and Family: More than three times a week   Frequency of Social  Gatherings with Friends and Family: More than three times a week   Attends Religious Services: More than 4 times per year   Active Member of [employer] or Organizations: Yes   Attends Archivist Meetings: More than 4 times per year   Marital Status: Widowed  Human resources officer Violence: Not At Risk   Fear of Current or Ex-Partner: No   Emotionally Abused: No   Physically Abused: No   Sexually Abused: No    Allergies  Allergen Reactions   Levofloxacin Shortness Of Breath   Penicillins Hives    itching   Meloxicam     dizziness   Nsaids Other (See Comments)    Patient states she can tolerate up to three doses per day without incident    Family History  Problem Relation Age of Onset   Hypertension Mother    Stroke Mother    Heart disease Father    Diabetes Father    Heart disease Daughter    Pulmonary embolism Daughter    Hypertension Son     Prior to Admission medications   Medication Sig Start Date End Date Taking? Authorizing Provider  albuterol (VENTOLIN HFA) 108 (90 Base) MCG/ACT inhaler Inhale 1-2 puffs into the lungs every 6 (six) hours as needed for wheezing or shortness of breath.   Yes [provider]  ALLERGY RELIEF 10 MG tablet TAKE ONE TABLET BY MOUTH BEFORE BREAKFAST Patient taking differently: Take 10 mg by mouth daily. 12/08/20  Yes Sowles, Drue Stager, MD  amLODipine (NORVASC) 5 MG tablet TAKE ONE TABLET BY MOUTH BEFORE BREAKFAST 02/12/21  Yes Sowles, Drue Stager, MD  aspirin 81 MG EC tablet TAKE 1 TABLET BY MOUTH EVERY DAY 04/22/19  Yes Sowles, Drue Stager, MD  atorvastatin (LIPITOR) 20 MG tablet TAKE ONE TABLET BY MOUTH BEFORE BREAKFAST 12/08/20  Yes Sowles, Drue Stager, MD  citalopram (CELEXA) 20 MG tablet TAKE ONE TABLET BY MOUTH EVERY EVENING 12/08/20  Yes Sowles, Drue Stager, MD  fluticasone (FLONASE) 50 MCG/ACT nasal spray Place 2 sprays into both nostrils daily. 01/06/20  Yes Sowles, Drue Stager, MD  ipratropium-albuterol (DUONEB) 0.5-2.5 (3) MG/3ML SOLN Inhale 3 mLs into  the lungs every 4 (four) hours as needed. 12/18/20  Yes Sowles, Drue Stager, MD  losartan (COZAAR) 100 MG tablet TAKE ONE TABLET BY MOUTH EVERY EVENING 12/08/20  Yes Sowles, Drue Stager, MD  metoprolol succinate (TOPROL-XL) 25 MG 24 hr tablet TAKE ONE TABLET BY MOUTH EVERY EVENING 12/08/20  Yes Sowles, Drue Stager, MD  pantoprazole (PROTONIX) 40 MG tablet TAKE ONE TABLET BY MOUTH EVERY EVENING 12/08/20  Yes Sowles, Drue Stager, MD  TRELEGY ELLIPTA 100-62.5-25 MCG/INH AEPB INHALE ONE PUFF BY MOUTH into THE lungs daily 02/02/21  Yes Steele Sizer, MD  Physical Exam: Vitals:   02/19/21 1215 02/19/21 1224 02/19/21 1230 02/19/21 1345  BP:   118/86   Pulse:   80 80  Resp:   16 19  SpO2:  99% 100% 100%  Weight: 64.4 kg     Height: '5\' 1"'$  (1.549 m)        General:  Appears calm and comfortable and is in NAD Cardiovascular:  RRR, no m/r/g.  Respiratory: Diminished breath sounds throughout. normal respiratory effort. Abdomen:  soft, NT, ND, NABS Skin:  no rash or induration seen on limited exam Musculoskeletal:  grossly normal tone BUE/BLE, good ROM, no bony abnormality Lower extremity:  No LE edema.  Limited foot exam with no ulcerations.  2+ distal pulses. Psychiatric:  grossly normal mood and affect, speech fluent and appropriate, AOx3 Neurologic:  CN 2-12 grossly intact, moves all extremities in coordinated fashion, sensation intact    Radiological Exams on Admission: Independently reviewed - see discussion in A/P where applicable  DG Chest Port 1 View  Result Date: 02/19/2021 CLINICAL DATA:  Shortness of breath.  COPD. EXAM: PORTABLE CHEST 1 VIEW COMPARISON:  02/02/2021 FINDINGS: Breathing mask projects over the upper chest. Cervical spine fixation. Numerous leads and wires project over the chest. Patient rotated left. Normal heart size. Atherosclerosis in the transverse aorta. No pleural effusion or pneumothorax. Diffuse peribronchial thickening. No lobar consolidation. Mild scarring or subsegmental  atelectasis at the left lung base laterally. IMPRESSION: No acute cardiopulmonary disease. Peribronchial thickening which may relate to chronic bronchitis or smoking. Electronically Signed   By: Abigail Miyamoto M.D.   On: 02/19/2021 13:12    EKG: Independently reviewed.  SR 82 bpm   Labs on Admission: I have personally reviewed the available labs and imaging studies at the time of the admission.  Pertinent labs: WBC 12, troponin 50, blood glucose 116, AST 59, ALT 52, BNP 152     Assessment/Plan: Acute hypoxic respiratory failure secondary to COPD exacerbation: This patient will be admitted to the medical/surgical floor under inpatient status.  Initially on BiPAP but now titrated off to 2 L nasal cannula and saturating well.  Start IV steroids and DuoNeb scheduled and as needed.  Hold home Trelegy inhaler.  Start incentive spirometry, chest PT and flutter valve.  Start doxycycline. Chest x-ray shows no signs of pneumonia.  Elevated troponins: Likely secondary to demand ischemia.  Denies any chest pain.  Hypertension: Continue home amlodipine, Cozaar and Toprol-XL  Depression: Continue home Celexa  Dyslipidemia: Continue home Lipitor  GERD: Continue home Protonix   Level of Care: MedSurg DVT prophylaxis: Lovenox subcu Code Status: Full code Consults: None Admission status: Inpatient   Leslee Home DO [facility]   How to contact the [facility] Attending or Consulting provider [place] or covering provider during after hours [place], for this patient?  Check the care team in [facility] and look for a) attending/consulting [facility] provider listed and b) the [facility] team listed Log into [email or web address] and use [place]'s universal password to access. If you do not have the password, please contact the hospital operator. Locate the [facility] provider you are looking for under [facility] and page to a number that you can be directly reached. If you still have difficulty reaching the provider,  please page the [facility] (Director on Call) for the Hospitalists listed on amion for assistance.   02/19/2021, 2:15 PM

## 2021-02-20 ENCOUNTER — Encounter: Payer: Self-pay | Admitting: Family Medicine

## 2021-02-20 ENCOUNTER — Other Ambulatory Visit: Payer: Self-pay

## 2021-02-20 DIAGNOSIS — J441 Chronic obstructive pulmonary disease with (acute) exacerbation: Secondary | ICD-10-CM

## 2021-02-20 LAB — SARS CORONAVIRUS 2 (TAT 6-24 HRS): SARS Coronavirus 2: NEGATIVE

## 2021-02-20 LAB — BASIC METABOLIC PANEL
Anion gap: 5 (ref 5–15)
BUN: 18 mg/dL (ref 8–23)
CO2: 25 mmol/L (ref 22–32)
Calcium: 8.2 mg/dL — ABNORMAL LOW (ref 8.9–10.3)
Chloride: 104 mmol/L (ref 98–111)
Creatinine, Ser: 0.65 mg/dL (ref 0.44–1.00)
GFR, Estimated: >60 mL/min (ref >60)
Glucose, Bld: 144 mg/dL — ABNORMAL HIGH (ref 70–99)
Potassium: 4.6 mmol/L (ref 3.5–5.1)
Sodium: 134 mmol/L — ABNORMAL LOW (ref 135–145)

## 2021-02-20 LAB — CBC
HCT: 36.5 % (ref 36.0–46.0)
Hemoglobin: 12.1 g/dL (ref 12.0–15.0)
MCH: 29.6 pg (ref 26.0–34.0)
MCHC: 33.2 g/dL (ref 30.0–36.0)
MCV: 89.2 fL (ref 80.0–100.0)
Platelets: 216 10*3/uL (ref 150–400)
RBC: 4.09 MIL/uL (ref 3.87–5.11)
RDW: 14.3 % (ref 11.5–15.5)
WBC: 8.3 10*3/uL (ref 4.0–10.5)
nRBC: 0 % (ref 0.0–0.2)

## 2021-02-20 LAB — TROPONIN I (HIGH SENSITIVITY)
Troponin I (High Sensitivity): 129 ng/L (ref <18)
Troponin I (High Sensitivity): 267 ng/L (ref <18)

## 2021-02-20 NOTE — Progress Notes (Signed)
Progress Note    Terri Burns  [id number] DOB: [birth year]  DOA: 02/19/2021 PCP: Steele Sizer, MD      Brief Narrative:    Medical records reviewed and are as summarized below:  Terri Burns is a 70 y.o. female with medical history significant for COPD, hypertension, GERD, depression, dyslipidemia, tobacco use disorder, who presented to the hospital with shortness of breath and wheezing.  Reportedly, her oxygen saturation was 50% on room air when EMS arrived.      Assessment/Plan:   Active Problems:   COPD with acute exacerbation (HCC)    Body mass index is 26.83 kg/m.   Acute COPD exacerbation: Continue steroids, bronchodilators and doxycycline.  Acute hypoxemic respiratory failure: She was initially treated with BiPAP but she has been successfully weaned off of BiPAP.  She is tolerating 2 L/min oxygen via nasal cannula.  Taper off oxygen as able.  Mildly elevated troponins: This is likely from demand ischemia.  Obtain 2D echo for further evaluation.  Other comorbidities include hypertension, depression, dyslipidemia, GERD 2.  Possible discharge to home tomorrow.   Diet Order             Diet Heart Room service appropriate? Yes; Fluid consistency: Thin  Diet effective now                      Consultants: None  Procedures: None    Medications:    amLODipine  5 mg Oral Daily   aspirin EC  81 mg Oral Daily   atorvastatin  20 mg Oral Daily   citalopram  20 mg Oral QPM   doxycycline  100 mg Oral Q12H   enoxaparin (LOVENOX) injection  40 mg Subcutaneous Q24H   ipratropium-albuterol  3 mL Nebulization Q6H   loratadine  10 mg Oral Daily   losartan  100 mg Oral QPM   methylPREDNISolone (SOLU-MEDROL) injection  40 mg Intravenous Q12H   Followed by   Derrill Memo ON 02/23/2021] predniSONE  40 mg Oral Q breakfast   metoprolol succinate  25 mg Oral QPM   nicotine  7 mg Transdermal Daily   pantoprazole  40 mg Oral QPM   Continuous  Infusions:   Anti-infectives (From admission, onward)    Start     Dose/Rate Route Frequency Ordered Stop   02/19/21 1445  doxycycline (VIBRA-TABS) tablet 100 mg        100 mg Oral Every 12 hours 02/19/21 1430 02/24/21 0959              Family Communication/Anticipated D/C date and plan/Code Status   DVT prophylaxis: enoxaparin (LOVENOX) injection 40 mg Start: 02/19/21 2200     Code Status: Full Code  Family Communication: None Disposition Plan:    Status is: Inpatient  Remains inpatient appropriate because:Inpatient level of care appropriate due to severity of illness  Dispo: The patient is from: Home              Anticipated d/c is to: Home              Patient currently is not medically stable to d/c.   Difficult to place patient No           Subjective:   Interval events noted.  She complains of shortness of breath and chest tightness.  Objective:    Vitals:   02/19/21 1800 02/19/21 1930 02/20/21 0240 02/20/21 0833  BP: (!) 140/98 (!) 143/90  (!) 145/97  Pulse: 77  72  70  Resp: '18 15  14  '$ Temp:    97.8 F (36.6 C)  SpO2: 100% 99% 99% 100%  Weight:      Height:       No data found.  No intake or output data in the 24 hours ending 02/20/21 1436 Filed Weights   02/19/21 1215  Weight: 64.4 kg    Exam:  GEN: NAD SKIN: Warm and dry EYES: EOMI ENT: MMM CV: RRR PULM: Decreased air entry bilaterally.  Occasional wheezing.  No rales heard. ABD: soft, ND, NT, +BS CNS: AAO x 3, non focal EXT: No edema or tenderness        Data Reviewed:   I have personally reviewed following labs and imaging studies:  Labs: Labs show the following:   Basic Metabolic Panel: Recent Labs  Lab 02/19/21 1220 02/20/21 0505  NA 136 134*  K 4.5 4.6  CL 106 104  CO2 25 25  GLUCOSE 116* 144*  BUN 21 18  CREATININE 0.70 0.65  CALCIUM 8.4* 8.2*   GFR Estimated Creatinine Clearance: 57 mL/min (by C-G formula based on SCr of 0.65  mg/dL). Liver Function Tests: Recent Labs  Lab 02/19/21 1220  AST 59*  ALT 52*  ALKPHOS 55  BILITOT 0.4  PROT 6.8  ALBUMIN 3.7   No results for input(s): LIPASE, AMYLASE in the last 168 hours. No results for input(s): AMMONIA in the last 168 hours. Coagulation profile No results for input(s): INR, PROTIME in the last 168 hours.  CBC: Recent Labs  Lab 02/19/21 1220 02/19/21 1504 02/20/21 0505  WBC 12.2* 11.0* 8.3  HGB 12.4 12.4 12.1  HCT 37.2 37.9 36.5  MCV 91.4 90.5 89.2  PLT 233 208 216   Cardiac Enzymes: No results for input(s): CKTOTAL, CKMB, CKMBINDEX, TROPONINI in the last 168 hours. BNP (last 3 results) No results for input(s): PROBNP in the last 8760 hours. CBG: No results for input(s): GLUCAP in the last 168 hours. D-Dimer: No results for input(s): DDIMER in the last 72 hours. Hgb A1c: No results for input(s): HGBA1C in the last 72 hours. Lipid Profile: No results for input(s): CHOL, HDL, LDLCALC, TRIG, CHOLHDL, LDLDIRECT in the last 72 hours. Thyroid function studies: No results for input(s): TSH, T4TOTAL, T3FREE, THYROIDAB in the last 72 hours.  Invalid input(s): FREET3 Anemia work up: No results for input(s): VITAMINB12, FOLATE, FERRITIN, TIBC, IRON, RETICCTPCT in the last 72 hours. Sepsis Labs: Recent Labs  Lab 02/19/21 1220 02/19/21 1504 02/20/21 0505  WBC 12.2* 11.0* 8.3    Microbiology Recent Results (from the past 240 hour(s))  SARS CORONAVIRUS 2 (TAT 6-24 HRS) Nasopharyngeal Nasopharyngeal Swab     Status: None   Collection Time: 02/19/21  7:25 PM   Specimen: Nasopharyngeal Swab  Result Value Ref Range Status   SARS Coronavirus 2 NEGATIVE NEGATIVE Final    Comment: (NOTE) SARS-CoV-2 target nucleic acids are NOT DETECTED.  The SARS-CoV-2 RNA is generally detectable in upper and lower respiratory specimens during the acute phase of infection. Negative results do not preclude SARS-CoV-2 infection, do not rule out co-infections with  other pathogens, and should not be used as the sole basis for treatment or other patient management decisions. Negative results must be combined with clinical observations, patient history, and epidemiological information. The expected result is Negative.  Fact Sheet for Patients: SugarRoll.be  Fact Sheet for Healthcare Providers: https://www.woods-mathews.com/  This test is not yet approved or cleared by the [place] FDA and  has  been authorized for detection and/or diagnosis of SARS-CoV-2 by FDA under an Emergency Use Authorization (EUA). This EUA will remain  in effect (meaning this test can be used) for the duration of the COVID-19 declaration under Se ction 564(b)(1) of the Act, 21 U.S.C. section 360bbb-3(b)(1), unless the authorization is terminated or revoked sooner.  Performed at [facility], [address], [place], [place] [address]     Procedures and diagnostic studies:  DG Chest Port 1 View  Result Date: 02/19/2021 CLINICAL DATA:  Shortness of breath.  COPD. EXAM: PORTABLE CHEST 1 VIEW COMPARISON:  02/02/2021 FINDINGS: Breathing mask projects over the upper chest. Cervical spine fixation. Numerous leads and wires project over the chest. Patient rotated left. Normal heart size. Atherosclerosis in the transverse aorta. No pleural effusion or pneumothorax. Diffuse peribronchial thickening. No lobar consolidation. Mild scarring or subsegmental atelectasis at the left lung base laterally. IMPRESSION: No acute cardiopulmonary disease. Peribronchial thickening which may relate to chronic bronchitis or smoking. Electronically Signed   By: Abigail Miyamoto M.D.   On: 02/19/2021 13:12               LOS: 1 day   Cordaro Mukai  [facility]   Pager on [email or web address]. If 7PM-7AM, please contact night-coverage at [email or web address]     02/20/2021, 2:36 PM

## 2021-02-20 NOTE — Progress Notes (Signed)
Spoke to [place] with [facility]. They are not currently active as patient told them she did not need HH when they last went out to the home. TOC to follow for DC planning assistance.  Oleh Genin, [place]

## 2021-02-21 DIAGNOSIS — J441 Chronic obstructive pulmonary disease with (acute) exacerbation: Secondary | ICD-10-CM | POA: Diagnosis not present

## 2021-02-21 LAB — CBC
HCT: 38 % (ref 36.0–46.0)
Hemoglobin: 12.6 g/dL (ref 12.0–15.0)
MCH: 29.6 pg (ref 26.0–34.0)
MCHC: 33.2 g/dL (ref 30.0–36.0)
MCV: 89.4 fL (ref 80.0–100.0)
Platelets: 256 10*3/uL (ref 150–400)
RBC: 4.25 MIL/uL (ref 3.87–5.11)
RDW: 14.6 % (ref 11.5–15.5)
WBC: 11.5 10*3/uL — ABNORMAL HIGH (ref 4.0–10.5)
nRBC: 0 % (ref 0.0–0.2)

## 2021-02-21 LAB — PROTIME-INR
INR: 1 (ref 0.8–1.2)
Prothrombin Time: 13.3 seconds (ref 11.4–15.2)

## 2021-02-21 LAB — TROPONIN I (HIGH SENSITIVITY)
Troponin I (High Sensitivity): 258 ng/L (ref <18)
Troponin I (High Sensitivity): 308 ng/L (ref <18)

## 2021-02-21 LAB — APTT: aPTT: 24 seconds (ref 24–36)

## 2021-02-21 LAB — HEPARIN LEVEL (UNFRACTIONATED): Heparin Unfractionated: 0.61 IU/mL (ref 0.30–0.70)

## 2021-02-21 MED ORDER — HEPARIN BOLUS VIA INFUSION
3500.0000 [IU] | Freq: Once | INTRAVENOUS | Status: AC
  Administered 2021-02-21: 3500 [IU] via INTRAVENOUS
  Filled 2021-02-21: qty 3500

## 2021-02-21 MED ORDER — HEPARIN (PORCINE) 25000 UT/250ML-% IV SOLN
750.0000 [IU]/h | INTRAVENOUS | Status: DC
  Administered 2021-02-21: 750 [IU]/h via INTRAVENOUS
  Filled 2021-02-21: qty 250

## 2021-02-21 MED ORDER — IPRATROPIUM-ALBUTEROL 0.5-2.5 (3) MG/3ML IN SOLN
3.0000 mL | Freq: Four times a day (QID) | RESPIRATORY_TRACT | Status: DC
  Administered 2021-02-21 – 2021-02-24 (×15): 3 mL via RESPIRATORY_TRACT
  Filled 2021-02-21 (×15): qty 3

## 2021-02-21 MED ORDER — IPRATROPIUM-ALBUTEROL 0.5-2.5 (3) MG/3ML IN SOLN: 3.0000 mL | Freq: Three times a day (TID) | RESPIRATORY_TRACT | Status: DC

## 2021-02-21 MED ORDER — ALPRAZOLAM 0.25 MG PO TABS
0.2500 mg | ORAL_TABLET | Freq: Two times a day (BID) | ORAL | Status: DC | PRN
  Administered 2021-02-21 – 2021-02-22 (×3): 0.25 mg via ORAL
  Filled 2021-02-21 (×3): qty 1

## 2021-02-21 NOTE — Progress Notes (Signed)
[facility] for IV heparin Indication: ACS/STEMI  Allergies  Allergen Reactions   Levofloxacin Shortness Of Breath   Penicillins Hives    itching   Meloxicam     dizziness   Nsaids Other (See Comments)    Patient states she can tolerate up to three doses per day without incident    Patient Measurements: Height: '5\' 1"'$  (154.9 cm) Weight: 64.4 kg (142 lb) IBW/kg (Calculated) : 47.8 Heparin Dosing Weight: 61.1 kg  Vital Signs: Temp: 98.2 F (36.8 C) (09/18 0826) Temp Source: Oral (09/18 0826) BP: 132/86 (09/18 0826) Pulse Rate: 72 (09/18 0826)  Labs: Recent Labs    02/19/21 1220 02/19/21 1504 02/20/21 0505 02/20/21 1014 02/21/21 0958 02/21/21 1151  HGB 12.4 12.4 12.1  --   --   --   HCT 37.2 37.9 36.5  --   --   --   PLT 233 208 216  --   --   --   CREATININE 0.70  --  0.65  --   --   --   TROPONINIHS 50*  --  129* 267* 258* 308*    Estimated Creatinine Clearance: 57 mL/min (by C-G formula based on SCr of 0.65 mg/dL).   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   prn at prn   ALLERGY RELIEF 10 MG tablet TAKE ONE TABLET BY MOUTH BEFORE BREAKFAST (Patient taking differently: Take 10 mg by mouth daily.) 90 tablet 0 02/18/2021 at 0830   amLODipine (NORVASC) 5 MG tablet TAKE ONE TABLET BY MOUTH BEFORE BREAKFAST 90 tablet 0 02/18/2021 at 0830   aspirin 81 MG EC tablet TAKE 1  TABLET BY MOUTH EVERY DAY 90 tablet 0 02/18/2021 at 0830   atorvastatin (LIPITOR) 20 MG tablet TAKE ONE TABLET BY MOUTH BEFORE BREAKFAST 90 tablet 0 02/18/2021 at 0830   citalopram (CELEXA) 20 MG tablet TAKE ONE TABLET BY MOUTH EVERY EVENING 90 tablet 0 02/18/2021 at 1800   fluticasone (FLONASE) 50 MCG/ACT nasal spray Place 2 sprays into both nostrils daily. 48 mL 0 prn at prn   ipratropium-albuterol (DUONEB) 0.5-2.5 (3) MG/3ML SOLN Inhale 3 mLs into the lungs every 4 (four) hours as needed. 120 mL 5 02/18/2021 at unk   losartan (COZAAR) 100 MG tablet TAKE ONE TABLET BY MOUTH EVERY EVENING 90 tablet 0 02/18/2021 at 1800   metoprolol succinate (TOPROL-XL) 25 MG 24 hr tablet TAKE ONE TABLET BY MOUTH EVERY EVENING 90 tablet 0 02/18/2021 at 1800   pantoprazole (PROTONIX) 40 MG tablet TAKE ONE TABLET BY MOUTH EVERY EVENING 90 tablet 0 02/18/2021 at 1800   TRELEGY ELLIPTA 100-62.5-25 MCG/INH AEPB INHALE ONE PUFF BY MOUTH into THE lungs daily 60 each 0 02/18/2021 at 0830   Scheduled:   amLODipine  5 mg Oral Daily   aspirin EC  81 mg Oral Daily   atorvastatin  20 mg Oral Daily  citalopram  20 mg Oral QPM   doxycycline  100 mg Oral Q12H   enoxaparin (LOVENOX) injection  40 mg Subcutaneous Q24H   ipratropium-albuterol  3 mL Nebulization Q6H   loratadine  10 mg Oral Daily   losartan  100 mg Oral QPM   methylPREDNISolone (SOLU-MEDROL) injection  40 mg Intravenous Q12H   Followed by   Derrill Memo ON 02/23/2021] predniSONE  40 mg Oral Q breakfast   metoprolol succinate  25 mg Oral QPM   nicotine  7 mg Transdermal Daily   pantoprazole  40 mg Oral QPM   Infusions:  PRN: acetaminophen **OR** acetaminophen, ALPRAZolam, fluticasone, HYDROcodone-acetaminophen, ipratropium-albuterol, morphine injection, ondansetron **OR** ondansetron (ZOFRAN) IV Anti-infectives (From admission, onward)    Start     Dose/Rate Route Frequency Ordered Stop   02/19/21 1445  doxycycline (VIBRA-TABS) tablet 100 mg        100 mg Oral Every  12 hours 02/19/21 1430 02/24/21 0959       Assessment: 70YOF PMH  significant for COPD, hypertension, GERD, depression, dyslipidemia, tobacco use disorder, who presented to the hospital with shortness of breath and wheezing. Troponin's slightly elevated on admit (~50), originally thought to be d/t demand ischemia.   On 9/18, troponin's increased to 308. EKG resulted "Lateral infarct, inferior infarct". ECHO pending.    Goal of Therapy:  Heparin level 0.3-0.7 units/ml Monitor platelets by anticoagulation protocol: Yes   Plan:  Give 3500 units bolus x 1 Start heparin infusion at 750 units/hr Check anti-Xa level in 6 hours and daily while on heparin Baseline CBC, aPTT, INR Daily CBC   Wynelle Cleveland, PharmD Pharmacy Resident  02/21/2021 3:55 PM

## 2021-02-21 NOTE — Progress Notes (Signed)
[facility] for IV heparin Indication: ACS/STEMI  Allergies  Allergen Reactions   Levofloxacin Shortness Of Breath   Penicillins Hives    itching   Meloxicam     dizziness   Nsaids Other (See Comments)    Patient states she can tolerate up to three doses per day without incident    Patient Measurements: Height: '5\' 1"'$  (154.9 cm) Weight: 64.4 kg (142 lb) IBW/kg (Calculated) : 47.8 Heparin Dosing Weight: 61.1 kg  Vital Signs: Temp: 98 F (36.7 C) (09/18 1940) Temp Source: Oral (09/18 1632) BP: 128/79 (09/18 1940) Pulse Rate: 70 (09/18 1632)  Labs: Recent Labs    02/19/21 1220 02/19/21 1504 02/20/21 0505 02/20/21 1014 02/21/21 0958 02/21/21 1151 02/21/21 1618 02/21/21 2300  HGB 12.4 12.4 12.1  --   --   --  12.6  --   HCT 37.2 37.9 36.5  --   --   --  38.0  --   PLT 233 208 216  --   --   --  256  --   APTT  --   --   --   --   --   --  24  --   LABPROT  --   --   --   --   --   --  13.3  --   INR  --   --   --   --   --   --  1.0  --   HEPARINUNFRC  --   --   --   --   --   --   --  0.61  CREATININE 0.70  --  0.65  --   --   --   --   --   TROPONINIHS 50*  --  129* 267* 258* 308*  --   --      Estimated Creatinine Clearance: 57 mL/min (by C-G formula based on SCr of 0.65 mg/dL).   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   prn at prn   ALLERGY RELIEF 10 MG  tablet TAKE ONE TABLET BY MOUTH BEFORE BREAKFAST (Patient taking differently: Take 10 mg by mouth daily.) 90 tablet 0 02/18/2021 at 0830   amLODipine (NORVASC) 5 MG tablet TAKE ONE TABLET BY MOUTH BEFORE BREAKFAST 90 tablet 0 02/18/2021 at 0830   aspirin 81 MG EC tablet TAKE 1 TABLET BY MOUTH EVERY DAY 90 tablet 0 02/18/2021 at 0830   atorvastatin (LIPITOR) 20 MG tablet TAKE ONE TABLET BY MOUTH BEFORE BREAKFAST 90 tablet 0 02/18/2021 at 0830   citalopram (CELEXA) 20 MG tablet TAKE ONE TABLET BY MOUTH EVERY EVENING 90 tablet 0 02/18/2021 at 1800   fluticasone (FLONASE) 50 MCG/ACT nasal spray Place 2 sprays into both nostrils daily. 48 mL 0 prn at prn   ipratropium-albuterol (DUONEB) 0.5-2.5 (3) MG/3ML SOLN Inhale 3 mLs into the lungs  every 4 (four) hours as needed. 120 mL 5 02/18/2021 at unk   losartan (COZAAR) 100 MG tablet TAKE ONE TABLET BY MOUTH EVERY EVENING 90 tablet 0 02/18/2021 at 1800   metoprolol succinate (TOPROL-XL) 25 MG 24 hr tablet TAKE ONE TABLET BY MOUTH EVERY EVENING 90 tablet 0 02/18/2021 at 1800   pantoprazole (PROTONIX) 40 MG tablet TAKE ONE TABLET BY MOUTH EVERY EVENING 90 tablet 0 02/18/2021 at 1800   TRELEGY ELLIPTA 100-62.5-25 MCG/INH AEPB INHALE ONE PUFF BY MOUTH into THE lungs daily 60 each 0 02/18/2021 at 0830   Scheduled:   amLODipine  5 mg Oral Daily   aspirin EC  81 mg Oral Daily   atorvastatin  20 mg Oral Daily   citalopram  20 mg Oral QPM   doxycycline  100 mg Oral Q12H   ipratropium-albuterol  3 mL Nebulization Q6H   loratadine  10 mg Oral Daily   losartan  100 mg Oral QPM   methylPREDNISolone (SOLU-MEDROL) injection  40 mg Intravenous Q12H   Followed by   Derrill Memo ON 02/23/2021] predniSONE  40 mg Oral Q breakfast   metoprolol succinate  25 mg Oral QPM   nicotine  7 mg Transdermal Daily   pantoprazole  40 mg Oral QPM   Infusions:   heparin 750 Units/hr (02/21/21 1806)   PRN: acetaminophen **OR** acetaminophen, ALPRAZolam, fluticasone, HYDROcodone-acetaminophen,  ipratropium-albuterol, morphine injection, ondansetron **OR** ondansetron (ZOFRAN) IV Anti-infectives (From admission, onward)    Start     Dose/Rate Route Frequency Ordered Stop   02/19/21 1445  doxycycline (VIBRA-TABS) tablet 100 mg        100 mg Oral Every 12 hours 02/19/21 1430 02/24/21 0959       Assessment: 70YOF PMH  significant for COPD, hypertension, GERD, depression, dyslipidemia, tobacco use disorder, who presented to the hospital with shortness of breath and wheezing. Troponin's slightly elevated on admit (~50), originally thought to be d/t demand ischemia.   On 9/18, troponin's increased to 308. EKG resulted "Lateral infarct, inferior infarct". ECHO pending.    Goal of Therapy:  Heparin level 0.3-0.7 units/ml Monitor platelets by anticoagulation protocol: Yes   Plan:  9/18:  HL @ 2300 = 0.61, therapeutic X 1  Will continue pt on current rate and draw confirmation level in 6 hrs on 9/19 @ 0500.   Shyane Fossum D, PharmD 02/21/2021 11:58 PM

## 2021-02-21 NOTE — Progress Notes (Addendum)
Progress Note    Terri Burns  [id number] DOB: March 14, 1951  DOA: 02/19/2021 PCP: Steele Sizer, MD      Brief Narrative:    Medical records reviewed and are as summarized below:  Terri Burns is a 70 y.o. female with medical history significant for COPD, hypertension, GERD, depression, dyslipidemia, tobacco use disorder, who presented to the hospital with shortness of breath and wheezing.  Reportedly, her oxygen saturation was 50% on room air when EMS arrived.      Assessment/Plan:   Active Problems:   COPD with acute exacerbation (HCC)    Body mass index is 26.83 kg/m.   Acute COPD exacerbation: Continue steroids, bronchodilators and doxycycline.  Chest pain, suspected NSTEMI: Troponins went from 50-308.  Consulted cardiologist, Dr. Lovena Le.  He recommended IV heparin infusion.  Continue aspirin and Lipitor.  2D echo is pending.  Acute hypoxemic respiratory failure: She was initially treated with BiPAP but she has been successfully weaned off of BiPAP.  She is tolerating 2 L/min oxygen via nasal cannula.  Taper off oxygen as able.  Anxiety: She has been started on low-dose Xanax to help with anxiety.  Other comorbidities include hypertension, depression, dyslipidemia, GERD 2.     Diet Order             Diet Heart Room service appropriate? Yes; Fluid consistency: Thin  Diet effective now                      Consultants: None  Procedures: None    Medications:    amLODipine  5 mg Oral Daily   aspirin EC  81 mg Oral Daily   atorvastatin  20 mg Oral Daily   citalopram  20 mg Oral QPM   doxycycline  100 mg Oral Q12H   enoxaparin (LOVENOX) injection  40 mg Subcutaneous Q24H   ipratropium-albuterol  3 mL Nebulization Q6H   loratadine  10 mg Oral Daily   losartan  100 mg Oral QPM   methylPREDNISolone (SOLU-MEDROL) injection  40 mg Intravenous Q12H   Followed by   Derrill Memo ON 02/23/2021] predniSONE  40 mg Oral Q breakfast   metoprolol  succinate  25 mg Oral QPM   nicotine  7 mg Transdermal Daily   pantoprazole  40 mg Oral QPM   Continuous Infusions:   Anti-infectives (From admission, onward)    Start     Dose/Rate Route Frequency Ordered Stop   02/19/21 1445  doxycycline (VIBRA-TABS) tablet 100 mg        100 mg Oral Every 12 hours 02/19/21 1430 02/24/21 0959              Family Communication/Anticipated D/C date and plan/Code Status   DVT prophylaxis: enoxaparin (LOVENOX) injection 40 mg Start: 02/19/21 2200     Code Status: Full Code  Family Communication: None Disposition Plan:    Status is: Inpatient  Remains inpatient appropriate because:Inpatient level of care appropriate due to severity of illness  Dispo: The patient is from: Home              Anticipated d/c is to: Home              Patient currently is not medically stable to d/c.   Difficult to place patient No           Subjective:   C/o chest tightness and shortness of breath. She's feeling very anxious. She had chest pain that radiated to her  left armpit/arm this morning.  Chest pain has resolved.    Objective:    Vitals:   02/21/21 0427 02/21/21 0444 02/21/21 0553 02/21/21 0826  BP:  (!) 187/127 (!) 161/120 132/86  Pulse:  72 76 72  Resp:  20  12  Temp:  97.6 F (36.4 C)    TempSrc:  Oral    SpO2: 100% 100% 100% 99%  Weight:      Height:       No data found.  No intake or output data in the 24 hours ending 02/21/21 0918 Filed Weights   02/19/21 1215 02/20/21 1906  Weight: 64.4 kg 64.4 kg    Exam:    GEN: NAD SKIN: Warm and dry EYES: EOMI ENT: MMM, neck collar in place CV: RRR PULM: Decreased air entry bilaterally, bilateral expiratory wheezing.  No rales heard ABD: soft, ND, NT, +BS CNS: AAO x 3, non focal EXT: No edema or tenderness        Data Reviewed:   I have personally reviewed following labs and imaging studies:  Labs: Labs show the following:   Basic Metabolic  Panel: Recent Labs  Lab 02/19/21 1220 02/20/21 0505  NA 136 134*  K 4.5 4.6  CL 106 104  CO2 25 25  GLUCOSE 116* 144*  BUN 21 18  CREATININE 0.70 0.65  CALCIUM 8.4* 8.2*   GFR Estimated Creatinine Clearance: 57 mL/min (by C-G formula based on SCr of 0.65 mg/dL). Liver Function Tests: Recent Labs  Lab 02/19/21 1220  AST 59*  ALT 52*  ALKPHOS 55  BILITOT 0.4  PROT 6.8  ALBUMIN 3.7   No results for input(s): LIPASE, AMYLASE in the last 168 hours. No results for input(s): AMMONIA in the last 168 hours. Coagulation profile No results for input(s): INR, PROTIME in the last 168 hours.  CBC: Recent Labs  Lab 02/19/21 1220 02/19/21 1504 02/20/21 0505  WBC 12.2* 11.0* 8.3  HGB 12.4 12.4 12.1  HCT 37.2 37.9 36.5  MCV 91.4 90.5 89.2  PLT 233 208 216   Cardiac Enzymes: No results for input(s): CKTOTAL, CKMB, CKMBINDEX, TROPONINI in the last 168 hours. BNP (last 3 results) No results for input(s): PROBNP in the last 8760 hours. CBG: No results for input(s): GLUCAP in the last 168 hours. D-Dimer: No results for input(s): DDIMER in the last 72 hours. Hgb A1c: No results for input(s): HGBA1C in the last 72 hours. Lipid Profile: No results for input(s): CHOL, HDL, LDLCALC, TRIG, CHOLHDL, LDLDIRECT in the last 72 hours. Thyroid function studies: No results for input(s): TSH, T4TOTAL, T3FREE, THYROIDAB in the last 72 hours.  Invalid input(s): FREET3 Anemia work up: No results for input(s): VITAMINB12, FOLATE, FERRITIN, TIBC, IRON, RETICCTPCT in the last 72 hours. Sepsis Labs: Recent Labs  Lab 02/19/21 1220 02/19/21 1504 02/20/21 0505  WBC 12.2* 11.0* 8.3    Microbiology Recent Results (from the past 240 hour(s))  SARS CORONAVIRUS 2 (TAT 6-24 HRS) Nasopharyngeal Nasopharyngeal Swab     Status: None   Collection Time: 02/19/21  7:25 PM   Specimen: Nasopharyngeal Swab  Result Value Ref Range Status   SARS Coronavirus 2 NEGATIVE NEGATIVE Final    Comment:  (NOTE) SARS-CoV-2 target nucleic acids are NOT DETECTED.  The SARS-CoV-2 RNA is generally detectable in upper and lower respiratory specimens during the acute phase of infection. Negative results do not preclude SARS-CoV-2 infection, do not rule out co-infections with other pathogens, and should not be used as the sole basis for  treatment or other patient management decisions. Negative results must be combined with clinical observations, patient history, and epidemiological information. The expected result is Negative.  Fact Sheet for Patients: SugarRoll.be  Fact Sheet for Healthcare Providers: https://www.woods-mathews.com/  This test is not yet approved or cleared by the [place] FDA and  has been authorized for detection and/or diagnosis of SARS-CoV-2 by FDA under an Emergency Use Authorization (EUA). This EUA will remain  in effect (meaning this test can be used) for the duration of the COVID-19 declaration under Se ction 564(b)(1) of the Act, 21 U.S.C. section 360bbb-3(b)(1), unless the authorization is terminated or revoked sooner.  Performed at [facility], [place] [address], [place], [place] [address]     Procedures and diagnostic studies:  DG Chest Port 1 View  Result Date: 02/19/2021 CLINICAL DATA:  Shortness of breath.  COPD. EXAM: PORTABLE CHEST 1 VIEW COMPARISON:  02/02/2021 FINDINGS: Breathing mask projects over the upper chest. Cervical spine fixation. Numerous leads and wires project over the chest. Patient rotated left. Normal heart size. Atherosclerosis in the transverse aorta. No pleural effusion or pneumothorax. Diffuse peribronchial thickening. No lobar consolidation. Mild scarring or subsegmental atelectasis at the left lung base laterally. IMPRESSION: No acute cardiopulmonary disease. Peribronchial thickening which may relate to chronic bronchitis or smoking. Electronically Signed   By: Abigail Miyamoto M.D.   On:  02/19/2021 13:12               LOS: 2 days   Kamryn Gauthier  [facility]   Pager on [email or web address]. If 7PM-7AM, please contact night-coverage at [email or web address]     02/21/2021, 9:18 AM

## 2021-02-21 NOTE — Progress Notes (Signed)
Prn svn given for sob & wheezing

## 2021-02-21 NOTE — Progress Notes (Signed)
Respiratory attempted to wean patient off O2 during the night. Patient called around 0400 anxious and SOB requesting breathing treatment and then pain medicine. Patient received both. Anxiety level decreasing.

## 2021-02-22 ENCOUNTER — Encounter
Admission: EM | Disposition: A | Discharge status: Home Health | Payer: Self-pay | Source: Home / Self Care | Attending: Internal Medicine

## 2021-02-22 ENCOUNTER — Encounter: Payer: Self-pay | Admitting: Family Medicine

## 2021-02-22 ENCOUNTER — Inpatient Hospital Stay (HOSPITAL_COMMUNITY)
Admit: 2021-02-22 | Discharge: 2021-02-22 | Disposition: A | Discharge status: Home / Self Care | Payer: Medicare Other | Attending: Physician Assistant | Admitting: Physician Assistant

## 2021-02-22 DIAGNOSIS — I252 Old myocardial infarction: Secondary | ICD-10-CM

## 2021-02-22 DIAGNOSIS — I214 Non-ST elevation (NSTEMI) myocardial infarction: Secondary | ICD-10-CM

## 2021-02-22 DIAGNOSIS — J441 Chronic obstructive pulmonary disease with (acute) exacerbation: Secondary | ICD-10-CM | POA: Diagnosis not present

## 2021-02-22 DIAGNOSIS — Z720 Tobacco use: Secondary | ICD-10-CM | POA: Diagnosis not present

## 2021-02-22 DIAGNOSIS — I2510 Atherosclerotic heart disease of native coronary artery without angina pectoris: Secondary | ICD-10-CM | POA: Diagnosis not present

## 2021-02-22 DIAGNOSIS — J449 Chronic obstructive pulmonary disease, unspecified: Secondary | ICD-10-CM

## 2021-02-22 DIAGNOSIS — I10 Essential (primary) hypertension: Secondary | ICD-10-CM

## 2021-02-22 DIAGNOSIS — R0609 Other forms of dyspnea: Secondary | ICD-10-CM | POA: Diagnosis not present

## 2021-02-22 HISTORY — PX: CORONARY STENT INTERVENTION: CATH118234

## 2021-02-22 HISTORY — PX: LEFT HEART CATH AND CORONARY ANGIOGRAPHY: CATH118249

## 2021-02-22 LAB — ECHOCARDIOGRAM COMPLETE
AR max vel: 2.19 cm2
AV Area VTI: 2.17 cm2
AV Area mean vel: 2.45 cm2
AV Mean grad: 3 mmHg
AV Peak grad: 5.7 mmHg
Ao pk vel: 1.19 m/s
Area-P 1/2: 4.24 cm2
Height: 61 in
MV VTI: 2.23 cm2
Single Plane A4C EF: 57.4 %
Weight: 2272.01 oz

## 2021-02-22 LAB — BASIC METABOLIC PANEL
Anion gap: 7 (ref 5–15)
BUN: 28 mg/dL — ABNORMAL HIGH (ref 8–23)
CO2: 23 mmol/L (ref 22–32)
Calcium: 8.7 mg/dL — ABNORMAL LOW (ref 8.9–10.3)
Chloride: 102 mmol/L (ref 98–111)
Creatinine, Ser: 0.65 mg/dL (ref 0.44–1.00)
GFR, Estimated: >60 mL/min (ref >60)
Glucose, Bld: 116 mg/dL — ABNORMAL HIGH (ref 70–99)
Potassium: 4.6 mmol/L (ref 3.5–5.1)
Sodium: 132 mmol/L — ABNORMAL LOW (ref 135–145)

## 2021-02-22 LAB — GLUCOSE, CAPILLARY: Glucose-Capillary: 117 mg/dL — ABNORMAL HIGH (ref 70–99)

## 2021-02-22 LAB — POCT ACTIVATED CLOTTING TIME
Activated Clotting Time: 347 seconds
Activated Clotting Time: 213 seconds

## 2021-02-22 LAB — CBC
HCT: 38.4 % (ref 36.0–46.0)
Hemoglobin: 12.5 g/dL (ref 12.0–15.0)
MCH: 29 pg (ref 26.0–34.0)
MCHC: 32.6 g/dL (ref 30.0–36.0)
MCV: 89.1 fL (ref 80.0–100.0)
Platelets: 245 10*3/uL (ref 150–400)
RBC: 4.31 MIL/uL (ref 3.87–5.11)
RDW: 14.5 % (ref 11.5–15.5)
WBC: 12.6 10*3/uL — ABNORMAL HIGH (ref 4.0–10.5)
nRBC: 0 % (ref 0.0–0.2)

## 2021-02-22 LAB — HEPARIN LEVEL (UNFRACTIONATED): Heparin Unfractionated: 0.54 IU/mL (ref 0.30–0.70)

## 2021-02-22 SURGERY — LEFT HEART CATH AND CORONARY ANGIOGRAPHY
Anesthesia: Moderate Sedation

## 2021-02-22 MED ORDER — LIDOCAINE HCL 1 % IJ SOLN
INTRAMUSCULAR | Status: AC
  Filled 2021-02-22: qty 20

## 2021-02-22 MED ORDER — PERFLUTREN LIPID MICROSPHERE
1.0000 mL | INTRAVENOUS | Status: AC | PRN
  Administered 2021-02-22: 4 mL via INTRAVENOUS
  Filled 2021-02-22: qty 10

## 2021-02-22 MED ORDER — IOHEXOL 350 MG/ML SOLN
INTRAVENOUS | Status: DC | PRN
  Administered 2021-02-22: 120 mL

## 2021-02-22 MED ORDER — FENTANYL CITRATE PF 50 MCG/ML IJ SOSY
PREFILLED_SYRINGE | INTRAMUSCULAR | Status: AC
  Filled 2021-02-22: qty 1

## 2021-02-22 MED ORDER — VERAPAMIL HCL 2.5 MG/ML IV SOLN
INTRAVENOUS | Status: AC
  Filled 2021-02-22: qty 2

## 2021-02-22 MED ORDER — SODIUM CHLORIDE 0.9% FLUSH
3.0000 mL | Freq: Two times a day (BID) | INTRAVENOUS | Status: DC
  Administered 2021-02-22 – 2021-02-26 (×8): 3 mL via INTRAVENOUS

## 2021-02-22 MED ORDER — TICAGRELOR 90 MG PO TABS
90.0000 mg | ORAL_TABLET | Freq: Two times a day (BID) | ORAL | Status: DC
  Administered 2021-02-22 – 2021-02-26 (×8): 90 mg via ORAL
  Filled 2021-02-22 (×8): qty 1

## 2021-02-22 MED ORDER — HEPARIN SODIUM (PORCINE) 1000 UNIT/ML IJ SOLN
INTRAMUSCULAR | Status: AC
  Filled 2021-02-22: qty 1

## 2021-02-22 MED ORDER — HEPARIN (PORCINE) IN NACL 1000-0.9 UT/500ML-% IV SOLN
INTRAVENOUS | Status: DC | PRN
  Administered 2021-02-22 (×2): 500 mL

## 2021-02-22 MED ORDER — SODIUM CHLORIDE 0.9% FLUSH: 3.0000 mL | INTRAVENOUS | Status: DC | PRN

## 2021-02-22 MED ORDER — FUROSEMIDE 10 MG/ML IJ SOLN
INTRAMUSCULAR | Status: AC
  Filled 2021-02-22: qty 4

## 2021-02-22 MED ORDER — MIDAZOLAM HCL 2 MG/2ML IJ SOLN
INTRAMUSCULAR | Status: DC | PRN
  Administered 2021-02-22 (×2): .5 mg via INTRAVENOUS

## 2021-02-22 MED ORDER — HEPARIN SODIUM (PORCINE) 1000 UNIT/ML IJ SOLN
INTRAMUSCULAR | Status: DC | PRN
  Administered 2021-02-22: 2000 [IU] via INTRAVENOUS
  Administered 2021-02-22 (×2): 3000 [IU] via INTRAVENOUS

## 2021-02-22 MED ORDER — FENTANYL CITRATE (PF) 100 MCG/2ML IJ SOLN
INTRAMUSCULAR | Status: DC | PRN
  Administered 2021-02-22 (×2): 25 ug via INTRAVENOUS

## 2021-02-22 MED ORDER — FUROSEMIDE 10 MG/ML IJ SOLN
INTRAMUSCULAR | Status: DC | PRN
  Administered 2021-02-22: 20 mg via INTRAVENOUS

## 2021-02-22 MED ORDER — LIDOCAINE HCL (PF) 1 % IJ SOLN
INTRAMUSCULAR | Status: DC | PRN
  Administered 2021-02-22 (×2): 2 mL

## 2021-02-22 MED ORDER — SODIUM CHLORIDE 0.9 % IV SOLN: INTRAVENOUS | Status: DC

## 2021-02-22 MED ORDER — ASPIRIN 81 MG PO CHEW: 81.0000 mg | CHEWABLE_TABLET | ORAL | Status: DC

## 2021-02-22 MED ORDER — NITROGLYCERIN 1 MG/10 ML FOR IR/CATH LAB
INTRA_ARTERIAL | Status: DC | PRN
  Administered 2021-02-22: 200 ug via INTRACORONARY

## 2021-02-22 MED ORDER — SODIUM CHLORIDE 0.9% FLUSH
3.0000 mL | INTRAVENOUS | Status: DC | PRN
  Administered 2021-02-23: 3 mL via INTRAVENOUS

## 2021-02-22 MED ORDER — SODIUM CHLORIDE 0.9 % IV SOLN: 250.0000 mL | INTRAVENOUS | Status: DC | PRN

## 2021-02-22 MED ORDER — TICAGRELOR 90 MG PO TABS
ORAL_TABLET | ORAL | Status: AC
  Filled 2021-02-22: qty 2

## 2021-02-22 MED ORDER — HEPARIN (PORCINE) IN NACL 1000-0.9 UT/500ML-% IV SOLN
INTRAVENOUS | Status: AC
  Filled 2021-02-22: qty 1000

## 2021-02-22 MED ORDER — SODIUM CHLORIDE 0.9% FLUSH: 3.0000 mL | Freq: Two times a day (BID) | INTRAVENOUS | Status: DC

## 2021-02-22 MED ORDER — MIDAZOLAM HCL 2 MG/2ML IJ SOLN
INTRAMUSCULAR | Status: AC
  Filled 2021-02-22: qty 2

## 2021-02-22 MED ORDER — CLOPIDOGREL BISULFATE 75 MG PO TABS: ORAL_TABLET | ORAL | Status: DC | PRN

## 2021-02-22 MED ORDER — ENOXAPARIN SODIUM 40 MG/0.4ML IJ SOSY
40.0000 mg | PREFILLED_SYRINGE | INTRAMUSCULAR | Status: DC
  Administered 2021-02-23 – 2021-02-26 (×4): 40 mg via SUBCUTANEOUS
  Filled 2021-02-22 (×4): qty 0.4

## 2021-02-22 MED ORDER — TICAGRELOR 90 MG PO TABS
ORAL_TABLET | ORAL | Status: DC | PRN
  Administered 2021-02-22: 180 mg via ORAL

## 2021-02-22 SURGICAL SUPPLY — 22 items
BALLN TREK RX 2.5X12 (BALLOONS) ×2
BALLN ~~LOC~~ EUPHORA RX 3.25X15 (BALLOONS) ×2
BALLOON TREK RX 2.5X12 (BALLOONS) ×1 IMPLANT
BALLOON ~~LOC~~ EUPHORA RX 3.25X15 (BALLOONS) ×1 IMPLANT
CATH EXTRAC PRONTO 5.5F 138CM (CATHETERS) ×2 IMPLANT
CATH INFINITI 5FR JK (CATHETERS) ×2 IMPLANT
CATH LAUNCHER 6FR EBU3.5 (CATHETERS) ×2 IMPLANT
CATH SHOCKWAVE 2.5X12 (CATHETERS) IMPLANT
CATHETER SHOCKWAVE 2.5X12 (CATHETERS)
DEVICE RAD TR BAND REGULAR (VASCULAR PRODUCTS) ×2 IMPLANT
DRAPE BRACHIAL (DRAPES) ×2 IMPLANT
GLIDESHEATH SLEND SS 6F .021 (SHEATH) ×2 IMPLANT
GUIDEWIRE INQWIRE 1.5J.035X260 (WIRE) ×1 IMPLANT
INQWIRE 1.5J .035X260CM (WIRE) ×2
KIT ENCORE 26 ADVANTAGE (KITS) ×2 IMPLANT
PACK CARDIAC CATH (CUSTOM PROCEDURE TRAY) ×2 IMPLANT
PROTECTION STATION PRESSURIZED (MISCELLANEOUS) ×2
SET ATX SIMPLICITY (MISCELLANEOUS) ×2 IMPLANT
STATION PROTECTION PRESSURIZED (MISCELLANEOUS) ×1 IMPLANT
STENT ONYX FRONTIER 3.0X18 (Permanent Stent) ×2 IMPLANT
TUBING CIL FLEX 10 FLL-RA (TUBING) ×2 IMPLANT
WIRE RUNTHROUGH .014X180CM (WIRE) ×2 IMPLANT

## 2021-02-22 NOTE — Progress Notes (Signed)
[facility] for IV heparin Indication: ACS/STEMI  Allergies  Allergen Reactions   Levofloxacin Shortness Of Breath   Penicillins Hives    itching   Meloxicam     dizziness   Nsaids Other (See Comments)    Patient states she can tolerate up to three doses per day without incident    Patient Measurements: Height: 5\' 1"  (154.9 cm) Weight: 64.4 kg (142 lb) IBW/kg (Calculated) : 47.8 Heparin Dosing Weight: 61.1 kg  Vital Signs: Temp: 98 F (36.7 C) (09/19 0304) Temp Source: Oral (09/19 0304) BP: 143/100 (09/19 0304) Pulse Rate: 70 (09/19 0304)  Labs: Recent Labs    02/19/21 1220 02/19/21 1504 02/20/21 0505 02/20/21 1014 02/21/21 0958 02/21/21 1151 02/21/21 1618 02/21/21 2300 02/22/21 0459  HGB 12.4 12.4 12.1  --   --   --  12.6  --   --   HCT 37.2 37.9 36.5  --   --   --  38.0  --   --   PLT 233 208 216  --   --   --  256  --   --   APTT  --   --   --   --   --   --  24  --   --   LABPROT  --   --   --   --   --   --  13.3  --   --   INR  --   --   --   --   --   --  1.0  --   --   HEPARINUNFRC  --   --   --   --   --   --   --  0.61 0.54  CREATININE 0.70  --  0.65  --   --   --   --   --   --   TROPONINIHS 50*  --  129* 267* 258* 308*  --   --   --      Estimated Creatinine Clearance: 57 mL/min (by C-G formula based on SCr of 0.65 mg/dL).   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   prn at prn   ALLERGY RELIEF 10 MG tablet TAKE ONE TABLET BY MOUTH BEFORE BREAKFAST (Patient taking differently: Take 10 mg by mouth daily.) 90 tablet 0 02/18/2021 at 0830   amLODipine (NORVASC) 5 MG tablet TAKE ONE TABLET BY MOUTH BEFORE BREAKFAST 90 tablet 0 02/18/2021 at 0830   aspirin 81 MG EC tablet TAKE 1 TABLET BY MOUTH EVERY DAY 90 tablet 0 02/18/2021 at 0830   atorvastatin (LIPITOR) 20 MG tablet TAKE ONE TABLET BY MOUTH BEFORE BREAKFAST 90 tablet 0 02/18/2021 at 0830   citalopram (CELEXA) 20 MG tablet TAKE ONE TABLET BY MOUTH EVERY EVENING 90 tablet 0 02/18/2021 at 1800   fluticasone (FLONASE) 50 MCG/ACT nasal spray  Place 2 sprays into both nostrils daily. 48 mL 0 prn at prn   ipratropium-albuterol (DUONEB) 0.5-2.5 (3) MG/3ML SOLN Inhale 3 mLs into the lungs every 4 (four) hours as needed. 120 mL 5 02/18/2021 at unk   losartan (COZAAR) 100 MG tablet TAKE ONE TABLET BY MOUTH EVERY EVENING 90 tablet 0 02/18/2021 at 1800   metoprolol succinate (TOPROL-XL) 25 MG 24 hr tablet TAKE ONE TABLET BY MOUTH EVERY EVENING 90 tablet 0 02/18/2021 at 1800   pantoprazole (PROTONIX) 40 MG tablet TAKE ONE TABLET BY MOUTH EVERY EVENING 90 tablet 0 02/18/2021 at 1800   TRELEGY ELLIPTA 100-62.5-25 MCG/INH AEPB INHALE ONE PUFF BY MOUTH into THE lungs daily 60 each 0 02/18/2021 at 0830   Scheduled:   amLODipine  5 mg Oral Daily   aspirin EC  81 mg Oral Daily   atorvastatin  20 mg Oral Daily   citalopram  20 mg Oral QPM   doxycycline  100 mg Oral Q12H   ipratropium-albuterol  3 mL Nebulization Q6H   loratadine  10 mg Oral Daily   losartan  100 mg Oral QPM   methylPREDNISolone (SOLU-MEDROL) injection  40 mg Intravenous Q12H   Followed by   Derrill Memo ON 02/23/2021] predniSONE  40 mg Oral Q breakfast   metoprolol succinate  25 mg Oral QPM   nicotine  7 mg Transdermal Daily   pantoprazole  40 mg Oral QPM   Infusions:   heparin 750 Units/hr (02/22/21 0213)   PRN: acetaminophen **OR**  acetaminophen, ALPRAZolam, fluticasone, HYDROcodone-acetaminophen, ipratropium-albuterol, morphine injection, ondansetron **OR** ondansetron (ZOFRAN) IV Anti-infectives (From admission, onward)    Start     Dose/Rate Route Frequency Ordered Stop   02/19/21 1445  doxycycline (VIBRA-TABS) tablet 100 mg        100 mg Oral Every 12 hours 02/19/21 1430 02/24/21 0959       Assessment: 70YOF PMH  significant for COPD, hypertension, GERD, depression, dyslipidemia, tobacco use disorder, who presented to the hospital with shortness of breath and wheezing. Troponin's slightly elevated on admit (~50), originally thought to be d/t demand ischemia.   On 9/18, troponin's increased to 308. EKG resulted "Lateral infarct, inferior infarct". ECHO pending.    Goal of Therapy:  Heparin level 0.3-0.7 units/ml Monitor platelets by anticoagulation protocol: Yes   Plan:  9/18:  HL @ 2300 = 0.61, therapeutic X 1  Will continue pt on current rate and draw confirmation level in 6 hrs on 9/19 @ 0500.   9/19:  HL @ 0459 = 0.54, therapeutic X 2  Will continue pt on current rate and recheck HL on 9/20 with AM labs.   Jodiann Ognibene D, PharmD 02/22/2021 6:33 AM

## 2021-02-22 NOTE — Progress Notes (Signed)
*  PRELIMINARY RESULTS* Echocardiogram 2D Echocardiogram has been performed.  Terri Burns 02/22/2021, 11:42 AM

## 2021-02-22 NOTE — Progress Notes (Signed)
Dr. Fletcher Anon at bedside, speaking with pt. Re: coronary PCI procedure. Pt. Verbalizes understanding of conversation at present. Orders placed to tx pt. To PCU post PCI.

## 2021-02-22 NOTE — Care Management Important Message (Signed)
Important Message  Patient Details  Name: Terri Burns MRN: [id number] Date of Birth: [birth year]   Medicare Important Message Given:  Yes     Dannette Barbara 02/22/2021, 3:02 PM

## 2021-02-22 NOTE — Progress Notes (Signed)
Progress Note    Terri Burns  FXO:[id number] DOB: [birth year]  DOA: 02/19/2021 PCP: Steele Sizer, MD      Brief Narrative:    Medical records reviewed and are as summarized below:  Terri Burns is a 70 y.o. female with medical history significant for COPD, hypertension, GERD, depression, dyslipidemia, tobacco use disorder, who presented to the hospital with shortness of breath and wheezing.  Reportedly, her oxygen saturation was 50% on room air when EMS arrived.      Assessment/Plan:   Active Problems:   COPD with acute exacerbation (HCC)    Body mass index is 26.83 kg/m.   Acute COPD exacerbation: Continue steroids, bronchodilators and doxycycline.    Chest pain, suspected NSTEMI: Troponins went from 50-308.  Continue IV heparin infusion and monitor heparin level per protocol.  Plan for left heart cath today.  Continue aspirin and Lipitor.  2D echo is pending.  Acute hypoxemic respiratory failure: She was initially treated with BiPAP but she has been successfully weaned off of BiPAP.  She is tolerating 2 L/min oxygen via nasal cannula.  Taper off oxygen as able.  Anxiety: Continue Xanax as needed for anxiety.  Other comorbidities include hypertension, depression, dyslipidemia, GERD 2.     Diet Order             Diet NPO time specified  Diet effective now                      Consultants: Cardiologist  Procedures: None    Medications:    [MAR Hold] amLODipine  5 mg Oral Daily   [START ON 02/23/2021] aspirin  81 mg Oral Pre-Cath   [MAR Hold] aspirin EC  81 mg Oral Daily   [MAR Hold] atorvastatin  20 mg Oral Daily   [MAR Hold] citalopram  20 mg Oral QPM   [MAR Hold] doxycycline  100 mg Oral Q12H   [MAR Hold] ipratropium-albuterol  3 mL Nebulization Q6H   [MAR Hold] loratadine  10 mg Oral Daily   [MAR Hold] losartan  100 mg Oral QPM   [MAR Hold] metoprolol succinate  25 mg Oral QPM   [MAR Hold] nicotine  7 mg Transdermal Daily    [MAR Hold] pantoprazole  40 mg Oral QPM   [MAR Hold] predniSONE  40 mg Oral Q breakfast   sodium chloride flush  3 mL Intravenous Q12H   Continuous Infusions:  sodium chloride     sodium chloride 100 mL/hr at 02/22/21 1516   heparin Stopped (02/22/21 1516)     Anti-infectives (From admission, onward)    Start     Dose/Rate Route Frequency Ordered Stop   02/19/21 1445  [MAR Hold]  doxycycline (VIBRA-TABS) tablet 100 mg        (MAR Hold since Mon 02/22/2021 at 1516.Hold Reason: Transfer to a Procedural area)   100 mg Oral Every 12 hours 02/19/21 1430 02/24/21 0959              Family Communication/Anticipated D/C date and plan/Code Status   DVT prophylaxis:      Code Status: Full Code  Family Communication: None Disposition Plan:    Status is: Inpatient  Remains inpatient appropriate because:Inpatient level of care appropriate due to severity of illness  Dispo: The patient is from: Home              Anticipated d/c is to: Home  Patient currently is not medically stable to d/c.   Difficult to place patient No           Subjective:   Interval events noted.  She complains of anxiety.  She said she had chest tightness last night but this improved with pain medicine.  Her nurse was at the bedside.  Dr. Fletcher Anon, cardiologist, was also at the bedside.  Objective:    Vitals:   02/22/21 0824 02/22/21 1316 02/22/21 1459 02/22/21 1532  BP:   126/80   Pulse:   67   Resp:   17   Temp:   98 F (36.7 C)   TempSrc:   Oral   SpO2: 100% 100% 100% 98%  Weight:      Height:       No data found.   Intake/Output Summary (Last 24 hours) at 02/22/2021 1548 Last data filed at 02/22/2021 0500 Gross per 24 hour  Intake 94.73 ml  Output 600 ml  Net -505.27 ml   Filed Weights   02/19/21 1215 02/20/21 1906  Weight: 64.4 kg 64.4 kg    Exam:  GEN: NAD SKIN: No rash EYES: EOMI ENT: MMM, neck collar in place CV: RRR PULM: Decreased air entry  bilaterally, bilateral expiratory wheezing.  No rales heard. ABD: soft, ND, NT, +BS CNS: AAO x 3, non focal EXT: No edema or tenderness         Data Reviewed:   I have personally reviewed following labs and imaging studies:  Labs: Labs show the following:   Basic Metabolic Panel: Recent Labs  Lab 02/19/21 1220 02/20/21 0505 02/22/21 0811  NA 136 134* 132*  K 4.5 4.6 4.6  CL 106 104 102  CO2 25 25 23   GLUCOSE 116* 144* 116*  BUN 21 18 28*  CREATININE 0.70 0.65 0.65  CALCIUM 8.4* 8.2* 8.7*   GFR Estimated Creatinine Clearance: 57 mL/min (by C-G formula based on SCr of 0.65 mg/dL). Liver Function Tests: Recent Labs  Lab 02/19/21 1220  AST 59*  ALT 52*  ALKPHOS 55  BILITOT 0.4  PROT 6.8  ALBUMIN 3.7   No results for input(s): LIPASE, AMYLASE in the last 168 hours. No results for input(s): AMMONIA in the last 168 hours. Coagulation profile Recent Labs  Lab 02/21/21 1618  INR 1.0    CBC: Recent Labs  Lab 02/19/21 1220 02/19/21 1504 02/20/21 0505 02/21/21 1618 02/22/21 0811  WBC 12.2* 11.0* 8.3 11.5* 12.6*  HGB 12.4 12.4 12.1 12.6 12.5  HCT 37.2 37.9 36.5 38.0 38.4  MCV 91.4 90.5 89.2 89.4 89.1  PLT 233 208 216 256 245   Cardiac Enzymes: No results for input(s): CKTOTAL, CKMB, CKMBINDEX, TROPONINI in the last 168 hours. BNP (last 3 results) No results for input(s): PROBNP in the last 8760 hours. CBG: Recent Labs  Lab 02/22/21 0733  GLUCAP 117*   D-Dimer: No results for input(s): DDIMER in the last 72 hours. Hgb A1c: No results for input(s): HGBA1C in the last 72 hours. Lipid Profile: No results for input(s): CHOL, HDL, LDLCALC, TRIG, CHOLHDL, LDLDIRECT in the last 72 hours. Thyroid function studies: No results for input(s): TSH, T4TOTAL, T3FREE, THYROIDAB in the last 72 hours.  Invalid input(s): FREET3 Anemia work up: No results for input(s): VITAMINB12, FOLATE, FERRITIN, TIBC, IRON, RETICCTPCT in the last 72 hours. Sepsis  Labs: Recent Labs  Lab 02/19/21 1504 02/20/21 0505 02/21/21 1618 02/22/21 0811  WBC 11.0* 8.3 11.5* 12.6*    Microbiology Recent Results (from the past  240 hour(s))  SARS CORONAVIRUS 2 (TAT 6-24 HRS) Nasopharyngeal Nasopharyngeal Swab     Status: None   Collection Time: 02/19/21  7:25 PM   Specimen: Nasopharyngeal Swab  Result Value Ref Range Status   SARS Coronavirus 2 NEGATIVE NEGATIVE Final    Comment: (NOTE) SARS-CoV-2 target nucleic acids are NOT DETECTED.  The SARS-CoV-2 RNA is generally detectable in upper and lower respiratory specimens during the acute phase of infection. Negative results do not preclude SARS-CoV-2 infection, do not rule out co-infections with other pathogens, and should not be used as the sole basis for treatment or other patient management decisions. Negative results must be combined with clinical observations, patient history, and epidemiological information. The expected result is Negative.  Fact Sheet for Patients: SugarRoll.be  Fact Sheet for Healthcare Providers: https://www.woods-mathews.com/  This test is not yet approved or cleared by the [place] FDA and  has been authorized for detection and/or diagnosis of SARS-CoV-2 by FDA under an Emergency Use Authorization (EUA). This EUA will remain  in effect (meaning this test can be used) for the duration of the COVID-19 declaration under Se ction 564(b)(1) of the Act, 21 U.S.C. section 360bbb-3(b)(1), unless the authorization is terminated or revoked sooner.  Performed at [facility], [place] [address] [place], [place] [address]     Procedures and diagnostic studies:  ECHOCARDIOGRAM COMPLETE  Result Date: 02/22/2021    ECHOCARDIOGRAM REPORT   Patient Name:   MYRON LONA Date of Exam: 02/22/2021 Medical Rec #:  [id number]      Height:       61.0 in Accession #:    [id number]     Weight:       142.0 lb Date of Birth:  03-15-1951       BSA:          1.633 m Patient Age:    81 years       BP:           116/88 mmHg Patient Gender: F              HR:           67 bpm. Exam Location:  [facility] Procedure: 2D Echo, Color Doppler, Cardiac Doppler and Intracardiac            Opacification Agent Indications:     R06.00 Dyspnea  History:         Patient has no prior history of Echocardiogram examinations.                  COPD; Risk Factors:Hypertension and Dyslipidemia.  Sonographer:     Charmayne Sheer Referring Phys:  [id number] Robin Searing VISSER Diagnosing Phys: Kathlyn Sacramento MD  Sonographer Comments: Technically challenging study due to limited acoustic windows and Technically difficult study due to poor echo windows. Image acquisition challenging due to COPD and Image acquisition challenging due to respiratory motion. IMPRESSIONS  1. Left ventricular ejection fraction, by estimation, is 55 to 60%. The left ventricle has normal function. Left ventricular endocardial border not optimally defined to evaluate regional wall motion. Left ventricular diastolic parameters are consistent with Grade I diastolic dysfunction (impaired relaxation).  2. Right ventricular systolic function is normal. The right ventricular size is normal. Tricuspid regurgitation signal is inadequate for assessing PA pressure.  3. The mitral valve is normal in structure. No evidence of mitral valve regurgitation. No evidence of mitral stenosis.  4. The aortic valve is normal in structure. Aortic valve regurgitation is  not visualized. Mild to moderate aortic valve sclerosis/calcification is present, without any evidence of aortic stenosis.  5. The inferior vena cava is normal in size with greater than 50% respiratory variability, suggesting right atrial pressure of 3 mmHg. FINDINGS  Left Ventricle: Left ventricular ejection fraction, by estimation, is 55 to 60%. The left ventricle has normal function. Left ventricular endocardial border not optimally defined to evaluate regional wall motion.  Definity contrast agent was given IV to delineate the left ventricular endocardial borders. The left ventricular internal cavity size was normal in size. There is no left ventricular hypertrophy. Left ventricular diastolic parameters are consistent with Grade I diastolic dysfunction (impaired relaxation). Right Ventricle: The right ventricular size is normal. No increase in right ventricular wall thickness. Right ventricular systolic function is normal. Tricuspid regurgitation signal is inadequate for assessing PA pressure. Left Atrium: Left atrial size was normal in size. Right Atrium: Right atrial size was normal in size. Pericardium: There is no evidence of pericardial effusion. Mitral Valve: The mitral valve is normal in structure. No evidence of mitral valve regurgitation. No evidence of mitral valve stenosis. MV peak gradient, 3.1 mmHg. The mean mitral valve gradient is 1.0 mmHg. Tricuspid Valve: The tricuspid valve is normal in structure. Tricuspid valve regurgitation is not demonstrated. No evidence of tricuspid stenosis. Aortic Valve: The aortic valve is normal in structure. Aortic valve regurgitation is not visualized. Mild to moderate aortic valve sclerosis/calcification is present, without any evidence of aortic stenosis. Aortic valve mean gradient measures 3.0 mmHg. Aortic valve peak gradient measures 5.7 mmHg. Aortic valve area, by VTI measures 2.17 cm. Pulmonic Valve: The pulmonic valve was normal in structure. Pulmonic valve regurgitation is not visualized. No evidence of pulmonic stenosis. Aorta: The aortic root is normal in size and structure. Venous: The inferior vena cava is normal in size with greater than 50% respiratory variability, suggesting right atrial pressure of 3 mmHg. IAS/Shunts: No atrial level shunt detected by color flow Doppler.  LEFT VENTRICLE PLAX 2D LVOT diam:     2.00 cm     Diastology LV SV:         46          LV e' medial:    4.24 cm/s LV SV Index:   28          LV E/e'  medial:  11.7 LVOT Area:     3.14 cm    LV e' lateral:   6.20 cm/s                            LV E/e' lateral: 8.0  LV Volumes (MOD) LV vol d, MOD A4C: 78.4 ml LV vol s, MOD A4C: 33.4 ml LV SV MOD A4C:     78.4 ml LEFT ATRIUM             Index LA Vol (A2C):   56.3 ml 34.48 ml/m LA Vol (A4C):   40.4 ml 24.74 ml/m LA Biplane Vol: 48.6 ml 29.76 ml/m  AORTIC VALVE AV Area (Vmax):    2.19 cm AV Area (Vmean):   2.45 cm AV Area (VTI):     2.17 cm AV Vmax:           119.00 cm/s AV Vmean:          78.500 cm/s AV VTI:            0.213 m AV Peak Grad:      5.7 mmHg  AV Mean Grad:      3.0 mmHg LVOT Vmax:         82.90 cm/s LVOT Vmean:        61.100 cm/s LVOT VTI:          0.147 m LVOT/AV VTI ratio: 0.69  AORTA Ao Root diam: 3.10 cm MITRAL VALVE MV Area (PHT): 4.24 cm    SHUNTS MV Area VTI:   2.23 cm    Systemic VTI:  0.15 m MV Peak grad:  3.1 mmHg    Systemic Diam: 2.00 cm MV Mean grad:  1.0 mmHg MV Vmax:       0.88 m/s MV Vmean:      44.9 cm/s MV Decel Time: 179 msec MV E velocity: 49.70 cm/s MV A velocity: 86.10 cm/s MV E/A ratio:  0.58 Kathlyn Sacramento MD Electronically signed by Kathlyn Sacramento MD Signature Date/Time: 02/22/2021/2:11:37 PM    Final                LOS: 3 days   Bren Steers  [facility]   Pager on [email or web address]. If 7PM-7AM, please contact night-coverage at [email or web address]     02/22/2021, 3:48 PM

## 2021-02-22 NOTE — Progress Notes (Signed)
TR band off at 19:30 ; mild swelling noted. Manual pressure held x 10 min. Noted start of ecchymosis around radial insert site. Marked soft swelling just below radial sheath site( previous). No hematoma. Sterile 2x2 and tegaderm to site. + capillary refill. Pt. Denies c/o numbness and tingling to right hand. Stable for tx to room now.

## 2021-02-22 NOTE — Consult Note (Signed)
Cardiology Consultation:   Patient ID: Terri Burns MRN: [id number]; DOB: April 09, 1951  Admit date: 02/19/2021 Date of Consult: 02/22/2021  PCP:  Steele Sizer, MD   [facility] Providers Cardiologist:[facility], Dr. Fletcher Anon    Patient Profile:   Terri Burns is a 70 y.o. female with a hx of COPD, current tobacco use, hypertension, hyperlipidemia, and no previously known history of heart disease or arrhythmia, and who is being seen 02/22/2021 for the evaluation of chest pain and elevated troponin at the request of Dr Mal Misty.  History of Present Illness:   Terri Burns is a 70 year old female with PMH as above.  She has no previously known history of heart disease or arrhythmia.  She reports family history of heart attack in her father in his 79s.  Other family history includes hypertension.  She reports current tobacco use or quarter pack per day and drinks alcohol several times per month.  She has recently been seen several times in the [facility] emergency department.  She was most recently admitted to [facility] 02/02/21-02/06/2021 for exacerbation of COPD and hypertensive urgency.  On 02/19/2021, she presented to the emergency department with significant shortness of breath.  She was placed on the BiPAP with oxygen saturations 99%.  On 9/1/8, she reports chest tightness around 5 AM with significant and had only been felt once before in the past, though less severe or long in duration at that time.  She reports this pain as different from that associated with her SOB/DOE, though also recalls that they had taken her off of oxygen the night before.  The chest tightness/pressure waxed and waned for the next 2 hours despite the administration of painkillers and breathing treatments.  At one point, she remembers that chest tightness radiating to under her left armpit.  She denies any associated tachypalpitations, presyncope, nausea, or symptoms of volume overload.  She does recall feeling the pain between her  shoulder blades the previous night.  On review of vitals and telemetry at that time, both elevated heart rate and BP noted Initial EKG NSR, 98 bpm, Q waves noted in the inferior 2, 3, aVF and precordial lateral leads. EKG 02/21/2021 at 11:25AM 77bpm with Q waves in the lateral and inferior leads, TWI and upsloping ST changes noted V1 through V4.  HS Tn 129  7  258  308. Labs have shown stable renal function and electrolytes, as well as blood counts.    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 Authorizing Provider  albuterol (VENTOLIN HFA) 108 (90 Base) MCG/ACT inhaler Inhale 1-2 puffs into the lungs every 6 (six) hours as needed for wheezing or shortness of breath.   Yes [provider]  ALLERGY RELIEF 10 MG tablet TAKE ONE TABLET BY MOUTH BEFORE BREAKFAST Patient taking differently: Take 10 mg by mouth daily. 12/08/20  Yes Sowles, Drue Stager, MD  amLODipine (NORVASC) 5 MG tablet TAKE ONE TABLET BY MOUTH  BEFORE BREAKFAST 02/12/21  Yes Sowles, Drue Stager, MD  aspirin 81 MG EC tablet TAKE 1 TABLET BY MOUTH EVERY DAY 04/22/19  Yes Sowles, Drue Stager, MD  atorvastatin (LIPITOR) 20 MG tablet TAKE ONE TABLET BY MOUTH BEFORE BREAKFAST 12/08/20  Yes Sowles, Drue Stager, MD  citalopram (CELEXA) 20 MG tablet TAKE ONE TABLET BY MOUTH EVERY EVENING 12/08/20  Yes Sowles, Drue Stager, MD  fluticasone (FLONASE) 50 MCG/ACT nasal spray Place 2 sprays into both nostrils daily. 01/06/20  Yes Sowles, Drue Stager, MD   ipratropium-albuterol (DUONEB) 0.5-2.5 (3) MG/3ML SOLN Inhale 3 mLs into the lungs every 4 (four) hours as needed. 12/18/20  Yes Sowles, Drue Stager, MD  losartan (COZAAR) 100 MG tablet TAKE ONE TABLET BY MOUTH EVERY EVENING 12/08/20  Yes Sowles, Drue Stager, MD  metoprolol succinate (TOPROL-XL) 25 MG 24 hr tablet TAKE ONE TABLET BY MOUTH EVERY EVENING 12/08/20  Yes Sowles, Drue Stager, MD  pantoprazole (PROTONIX) 40 MG tablet TAKE ONE TABLET BY MOUTH EVERY EVENING 12/08/20  Yes Sowles, Drue Stager, MD  Viviana Simpler ELLIPTA 100-62.5-25 MCG/INH AEPB INHALE ONE PUFF BY MOUTH into THE lungs daily 02/02/21  Yes Steele Sizer, MD    Inpatient Medications: Scheduled Meds:  amLODipine  5 mg Oral Daily   aspirin EC  81 mg Oral Daily   atorvastatin  20 mg Oral Daily   citalopram  20 mg Oral QPM   doxycycline  100 mg Oral Q12H   ipratropium-albuterol  3 mL Nebulization Q6H   loratadine  10 mg Oral Daily   losartan  100 mg Oral QPM   methylPREDNISolone (SOLU-MEDROL) injection  40 mg Intravenous Q12H   Followed by   Derrill Memo ON 02/23/2021] predniSONE  40 mg Oral Q breakfast   metoprolol succinate  25 mg Oral QPM   nicotine  7 mg Transdermal Daily   pantoprazole  40 mg Oral QPM   Continuous Infusions:  heparin 750 Units/hr (02/22/21 0213)   PRN Meds: acetaminophen **OR** acetaminophen, ALPRAZolam, fluticasone, HYDROcodone-acetaminophen, ipratropium-albuterol, morphine injection, ondansetron **OR** ondansetron (ZOFRAN) IV  Allergies:    Allergies  Allergen Reactions   Levofloxacin Shortness Of Breath   Penicillins Hives    itching   Meloxicam     dizziness   Nsaids Other (See Comments)    Patient states she can tolerate up to three doses per day without incident    Social History:   Social History   Socioeconomic History   Marital status: Widowed    Spouse name: Not on file   Number of children: 3   Years of education: Not on file   Highest education level: Some college, no degree  Occupational History    Occupation: disabled     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   Her daughter died due to heart issues on 11-07-2022,  2020 at the age of 36.   Both sons live in town and are helping her out    Social Determinants of Radio broadcast assistant Strain: Low Risk    Difficulty of Paying Living Expenses: Not hard at all  Food Insecurity: No Food Insecurity   Worried About Charity fundraiser in the Last Year: Never true   Arboriculturist in the Last Year: Never true  Transportation Needs: No Transportation Needs   Lack of Transportation (Medical): No   Lack of Transportation (Non-Medical): No  Physical Activity: Inactive   Days of Exercise per Week: 0 days   Minutes of Exercise per Session: 0 min  Stress: No Stress Concern Present   Feeling of Stress : Not at all  Social Connections: Moderately Integrated   Frequency of Communication with Friends and Family: More than three times a week   Frequency of Social Gatherings with Friends and Family: More than three times a week   Attends Religious Services: More than 4 times per year   Active Member of [employer] or Organizations: Yes   Attends Archivist Meetings: More than 4 times per year   Marital Status: Widowed  Human resources officer Violence: Not At Risk   Fear of Current or Ex-Partner: No   Emotionally Abused: No   Physically Abused: No   Sexually Abused: No    Family  History:    Family History  Problem Relation Age of Onset   Hypertension Mother    Stroke Mother    Heart disease Father    Diabetes Father    Heart disease Daughter    Pulmonary embolism Daughter    Hypertension Son      ROS:  Please see the history of present illness.  Review of Systems  Respiratory:  Positive for shortness of breath.   Cardiovascular:  Positive for chest pain. Negative for palpitations, orthopnea, leg swelling and PND.  Gastrointestinal:  Negative for abdominal pain, nausea and vomiting.  Musculoskeletal:  Positive for falls.       Remote fall 2 years ago, still wearing cervical call  Neurological:  Negative for dizziness and loss of consciousness.   All other ROS reviewed and negative.     Physical Exam/Data:   Vitals:   02/21/21 1947 02/22/21 0304 02/22/21 0326 02/22/21 0734  BP:  (!) 143/100  116/88  Pulse:  70  69  Resp:  14    Temp:  98 F (36.7 C)  97.6 F (36.4 C)  TempSrc:  Oral    SpO2: 100% 100% 100% 100%  Weight:      Height:        Intake/Output Summary (Last 24 hours) at 02/22/2021 0748 Last data filed at 02/22/2021 0500 Gross per 24 hour  Intake 334.73 ml  Output 600 ml  Net -265.27 ml   Last 3 Weights 02/20/2021 02/19/2021 02/02/2021  Weight (lbs) 142 lb 142 lb 142 lb 6.7 oz  Weight (kg) 64.411 kg 64.411 kg 64.6 kg     Body mass index is 26.83 kg/m.  General:  Well nourished, well developed, in no acute distress.  Cervical collar in place. HEENT: normal Neck: no JVD Vascular: No carotid bruits; Distal pulses 2+ bilaterally Cardiac: Soft heart sounds normal S1, S2; RRR; no murmur  Lungs:  poor inspiratory effort. Bibasilar crackles.  Abd: soft, nontender, no hepatomegaly  Ext: no edema Musculoskeletal:  No deformities, BUE and BLE strength normal and equal Skin: warm and dry  Neuro:  CNs 2-12 intact, no focal abnormalities noted Psych:  Normal affect   EKG:  The EKG was personally reviewed and demonstrates:  Initial EKG  NSR, 98 bpm, Q waves noted in the inferior 2, 3, aVF and precordial lateral leads. EKG 02/21/2021 at 11:25AM 77bpm with Q waves in the lateral and inferior leads, TWI and upsloping ST changes noted V1 through V4.  Telemetry:  Telemetry was personally reviewed and demonstrates:  NSR and runs of SVT 60-130s  Relevant CV Studies: Pending echo  Laboratory Data:  High Sensitivity Troponin:   Recent Labs  Lab 02/19/21 1220 02/20/21 0505 02/20/21 1014 02/21/21 0958 02/21/21 1151  TROPONINIHS 50* 129* 267* 258* 308*     Chemistry Recent Labs  Lab 02/19/21 1220 02/20/21 0505  NA 136 134*  K 4.5 4.6  CL 106 104  CO2 25 25  GLUCOSE 116* 144*  BUN 21 18  CREATININE 0.70 0.65  CALCIUM 8.4* 8.2*  GFRNONAA >60 >60  ANIONGAP 5 5    Recent Labs  Lab 02/19/21 1220  PROT 6.8  ALBUMIN 3.7  AST 59*  ALT 52*  ALKPHOS 55  BILITOT 0.4   Lipids No results for input(s): CHOL, TRIG, HDL, LABVLDL, LDLCALC, CHOLHDL in the last 168 hours.  Hematology Recent Labs  Lab 02/19/21 1504 02/20/21 0505 02/21/21 1618  WBC 11.0* 8.3 11.5*  RBC 4.19 4.09 4.25  HGB 12.4 12.1 12.6  HCT 37.9 36.5 38.0  MCV 90.5 89.2 89.4  MCH 29.6 29.6 29.6  MCHC 32.7 33.2 33.2  RDW 14.3 14.3 14.6  PLT 208 216 256   Thyroid No results for input(s): TSH, FREET4 in the last 168 hours.  BNP Recent Labs  Lab 02/19/21 1220  BNP 152.9*    DDimer No results for input(s): DDIMER in the last 168 hours.   Radiology/Studies:  DG Chest Port 1 View  Result Date: 02/19/2021 CLINICAL DATA:  Shortness of breath.  COPD. EXAM: PORTABLE CHEST 1 VIEW COMPARISON:  02/02/2021 FINDINGS: Breathing mask projects over the upper chest. Cervical spine fixation. Numerous leads and wires project over the chest. Patient rotated left. Normal heart size. Atherosclerosis in the transverse aorta. No pleural effusion or pneumothorax. Diffuse peribronchial thickening. No lobar consolidation. Mild scarring or subsegmental atelectasis at the  left lung base laterally. IMPRESSION: No acute cardiopulmonary disease. Peribronchial thickening which may relate to chronic bronchitis or smoking. Electronically Signed   By: Abigail Miyamoto M.D.   On: 02/19/2021 13:12     Assessment and Plan:   Non-STEMI -- No current chest pain.  Presented with shortness of breath and admitted for COPD exacerbation 9/16 with subsequent chest tightness/pressure 9/18 after respiratory attempted to wean the patient off of oxygen during the night.  Chest pain has both typical and atypical features.  HS Tn 50  267  258  308.  Initial EKG without acute changes and Q waves in the inferior lateral leads.  Subsequent EKGs have shown T wave inversion in the precordial/anterior leads.  Risk factors for CAD include family history, smoker, hypertension.  Echo pending.  Given risk factors for CAD, cannot completely rule out chest pain due to coronary insufficiency at this time. If echo shows reduced EF or WMA, or if EKG changes still present on repeat EKG, recommend further ischemic workup with LHC. Continue IV heparin. Continue ASA and BB. Will check LDL. Continue statin. Will make NPO pending MD to see patient and echo/EKG resulted.   Essential hypertension - Continue amlodipine, losartan, metoprolol.  COPD -- Ideally, wean off of prednisone.  Continue oxygen and breathing treatments.  Tobacco use --Complete smoking cessation advised.  She remains on a nicotine patch at this time.   For questions or updates, please contact [place] Please consult [email or web address] for contact info under    Signed, Arvil Chaco, PA-C  02/22/2021 7:48 AM

## 2021-02-23 ENCOUNTER — Encounter: Payer: Self-pay | Admitting: Cardiovascular Disease

## 2021-02-23 DIAGNOSIS — J441 Chronic obstructive pulmonary disease with (acute) exacerbation: Secondary | ICD-10-CM | POA: Diagnosis not present

## 2021-02-23 DIAGNOSIS — I214 Non-ST elevation (NSTEMI) myocardial infarction: Secondary | ICD-10-CM | POA: Diagnosis not present

## 2021-02-23 LAB — BASIC METABOLIC PANEL
Anion gap: 8 (ref 5–15)
BUN: 32 mg/dL — ABNORMAL HIGH (ref 8–23)
CO2: 28 mmol/L (ref 22–32)
Calcium: 8.6 mg/dL — ABNORMAL LOW (ref 8.9–10.3)
Chloride: 99 mmol/L (ref 98–111)
Creatinine, Ser: 0.9 mg/dL (ref 0.44–1.00)
GFR, Estimated: >60 mL/min (ref >60)
Glucose, Bld: 79 mg/dL (ref 70–99)
Potassium: 4.1 mmol/L (ref 3.5–5.1)
Sodium: 135 mmol/L (ref 135–145)

## 2021-02-23 LAB — GLUCOSE, CAPILLARY
Glucose-Capillary: 84 mg/dL (ref 70–99)
Glucose-Capillary: 111 mg/dL — ABNORMAL HIGH (ref 70–99)
Glucose-Capillary: 103 mg/dL — ABNORMAL HIGH (ref 70–99)
Glucose-Capillary: 135 mg/dL — ABNORMAL HIGH (ref 70–99)

## 2021-02-23 LAB — CBC
HCT: 37.8 % (ref 36.0–46.0)
Hemoglobin: 12.4 g/dL (ref 12.0–15.0)
MCH: 29.3 pg (ref 26.0–34.0)
MCHC: 32.8 g/dL (ref 30.0–36.0)
MCV: 89.4 fL (ref 80.0–100.0)
Platelets: 210 10*3/uL (ref 150–400)
RBC: 4.23 MIL/uL (ref 3.87–5.11)
RDW: 14.4 % (ref 11.5–15.5)
WBC: 9.4 10*3/uL (ref 4.0–10.5)
nRBC: 0 % (ref 0.0–0.2)

## 2021-02-23 MED ORDER — ISOSORBIDE MONONITRATE ER 30 MG PO TB24
15.0000 mg | ORAL_TABLET | Freq: Every day | ORAL | Status: DC
  Administered 2021-02-23 – 2021-02-26 (×4): 15 mg via ORAL
  Filled 2021-02-23 (×4): qty 1

## 2021-02-23 NOTE — Evaluation (Signed)
Physical Therapy Evaluation Patient Details Name: Terri Burns MRN: [id number] DOB: June 30, 1950 Today's Date: 02/23/2021  History of Present Illness  Terri Burns is a 70 y.o. female with a past medical history of COPD not on home oxygen, hypertension, GERD, depression, dyslipidemia, tobacco dependence currently smoking 1/4 packs/day.  This patient presented to the emergency department with shortness of breath/respiratory distress that started 8:00 this morning.  She was noted to be tachypneic and wheezing.  When EMS arrived she was 50% on room air. s/p NSTEMI with cath.   Clinical Impression  Patient received in bed, she is agreeable to PT assessment. She is mod independent with bed mobility, transfers with min assist and is able to take a few steps along edge of bed. Mainly limited by sob, dizziness and fatigue. Patient breathing heavily and wheezing with minimal activity although O2 saturations at 98% on room air. She will continue to benefit from skilled PT while here to improve strength and activity  tolerance for safe return home.         Recommendations for follow up therapy are one component of a multi-disciplinary discharge planning process, led by the attending physician.  Recommendations may be updated based on patient status, additional functional criteria and insurance authorization.  Follow Up Recommendations Home health PT;Supervision - Intermittent    Equipment Recommendations  None recommended by PT    Recommendations for Other Services       Precautions / Restrictions Precautions Precautions: Fall Precaution Comments: cervial collar X 2 years, cleared to remove by neuro 9/2 Restrictions Weight Bearing Restrictions: No      Mobility  Bed Mobility Overal bed mobility: Modified Independent             General bed mobility comments: with use of bed rails and increased effort she is able to get to edge of bed herself    Transfers Overall transfer level:  Needs assistance Equipment used: 1 person hand held assist Transfers: Sit to/from Stand Sit to Stand: Min assist         General transfer comment: Min assist for sit to stand with patient moaning. Reports dizziness upon standing.  Ambulation/Gait Ambulation/Gait assistance: Min assist Gait Distance (Feet): 3 Feet Assistive device: 1 person hand held assist Gait Pattern/deviations: Step-to pattern;Decreased step length - right;Decreased step length - left Gait velocity: decr   General Gait Details: patient able to take a few side steps along edge of bed with min assist. Very sob and wheezing with activity. O2 saturations at 98%  Stairs            Wheelchair Mobility    Modified Rankin (Stroke Patients Only)       Balance Overall balance assessment: Needs assistance Sitting-balance support: Feet supported Sitting balance-Leahy Scale: Good     Standing balance support: Single extremity supported;During functional activity Standing balance-Leahy Scale: Fair                               Pertinent Vitals/Pain Pain Descriptors / Indicators: Discomfort    Home Living Family/patient expects to be discharged to:: Private residence Living Arrangements: Alone Available Help at Discharge: Family;Friend(s);Available PRN/intermittently Type of Home: Apartment Home Access: Level entry     Home Layout: One level Home Equipment: Walker - 4 wheels Additional Comments: Used rollator at baseline for community ambulation    Prior Function Level of Independence: Independent         Comments:  Pt reports being independent with ADLs and most IADLs     Hand Dominance   Dominant Hand: Right    Extremity/Trunk Assessment   Upper Extremity Assessment Upper Extremity Assessment: Generalized weakness    Lower Extremity Assessment Lower Extremity Assessment: Generalized weakness       Communication   Communication: No difficulties  Cognition  Arousal/Alertness: Awake/alert Behavior During Therapy: WFL for tasks assessed/performed Overall Cognitive Status: Within Functional Limits for tasks assessed                                 General Comments: A&O x 4      General Comments      Exercises     Assessment/Plan    PT Assessment Patient needs continued PT services  PT Problem List Decreased strength;Decreased mobility;Decreased activity tolerance;Decreased balance;Cardiopulmonary status limiting activity       PT Treatment Interventions DME instruction;Therapeutic activities;Gait training;Therapeutic exercise;Functional mobility training;Patient/family education    PT Goals (Current goals can be found in the Care Plan section)  Acute Rehab PT Goals Patient Stated Goal: to go home and breath better PT Goal Formulation: With patient Time For Goal Achievement: 03/06/21 Potential to Achieve Goals: Fair    Frequency Min 2X/week   Barriers to discharge Decreased caregiver support      Co-evaluation               AM-PAC PT "6 Clicks" Mobility  Outcome Measure Help needed turning from your back to your side while in a flat bed without using bedrails?: None Help needed moving from lying on your back to sitting on the side of a flat bed without using bedrails?: None Help needed moving to and from a bed to a chair (including a wheelchair)?: A Little Help needed standing up from a chair using your arms (e.g., wheelchair or bedside chair)?: A Little Help needed to walk in hospital room?: A Lot Help needed climbing 3-5 steps with a railing? : A Lot 6 Click Score: 18    End of Session   Activity Tolerance: Patient limited by fatigue;Other (comment) (dizziness) Patient left: in bed;with bed alarm set;with nursing/sitter in room;with call bell/phone within reach Nurse Communication: Mobility status PT Visit Diagnosis: Muscle weakness (generalized) (M62.81);Difficulty in walking, not elsewhere  classified (R26.2);Unsteadiness on feet (R26.81)    Time: 1350-1409 PT Time Calculation (min) (ACUTE ONLY): 19 min   Charges:   PT Evaluation $PT Eval Moderate Complexity: 1 Mod         Beau Ramsburg, PT, GCS 02/23/21,2:48 PM

## 2021-02-23 NOTE — Progress Notes (Signed)
Patient had a PT consult put in. PT came in at 1400. Patient was able to stand in the room but became dizzy. She only stood up for a few minutes before she ran out of breath again. She was able to sit up at the edge of the bed but needed assistance by the PT.   Her O2 sats during the process was 98%  Patient lives at home alone. There are concerns about where she will be discharged because she has declined rehab/ SNF for now.   Patient states that she would rather stay in hospital for a few more days until she is sure that she feels more stable to to go home.

## 2021-02-23 NOTE — Progress Notes (Addendum)
Progress Note    Terri Burns  BMW:[id number] DOB: [birth year]  DOA: 02/19/2021 PCP: Steele Sizer, MD      Brief Narrative:    Medical records reviewed and are as summarized below:  Terri Burns is a 70 y.o. female with medical history significant for COPD, hypertension, GERD, depression, dyslipidemia, tobacco use disorder, history of C2 odontoid cervical fracture, who presented to the hospital with shortness of breath and wheezing.  Reportedly, her oxygen saturation was 50% on room air when EMS arrived.  She was admitted to the hospital for COPD exacerbation and acute hypoxic respiratory failure.  She was treated with steroids, bronchodilators and doxycycline.  Initially, she required BiPAP for acute hypoxemic respiratory failure and was then transitioned to oxygen via nasal cannula.    While in the hospital, she complained of chest pain/chest tightness.  Blood work showed increasing troponins (highest troponin level was 308) and nonspecific ST-T changes on EKG.  She was started on IV heparin for acute NSTEMI.  She underwent left heart cath which showed severe proximal LAD stenosis (99%) and moderate RCA disease (60% stenosis).  Drug-eluting stent was placed to proximal LAD.    Assessment/Plan:   Active Problems:   COPD with acute exacerbation (HCC)   Non-ST elevation (NSTEMI) myocardial infarction (HCC)    Body mass index is 26.83 kg/m.   Acute COPD exacerbation: Continue steroids, bronchodilators and doxycycline.    Acute NSTEMI: S/p left heart cath which showed severe proximal LAD stenosis (99%) and moderate RCA disease (60% stenosis).  Drug-eluting stent was placed to proximal LAD.  Continue aspirin, Brilinta, metoprolol and statin. Patient continued to have chest pain this morning.  Case was discussed with Dr. Saunders Revel (cardiologist) who recommended that patient be observed overnight.  He will consider repeat cardiac cath tomorrow to address RCA stenosis if patient  continues to have chest pain.  Acute hypoxemic respiratory failure: Improved.  She was tolerating room air this morning.  She was initially treated with BiPAP but she has been successfully weaned off of BiPAP and oxygen via nasal cannula  Anxiety: Continue Xanax as needed for anxiety.  Other comorbidities include hypertension, depression, dyslipidemia, GERD 2, history of cervical spine (C2 odontoid) fracture.  Of note, regarding her history of C2 odontoid fracture, patient said that she has been told that her cervical collar can be removed but it should be done gradually over time.   Diet Order             Diet Heart Room service appropriate? Yes; Fluid consistency: Thin  Diet effective now                      Consultants: Cardiologist  Procedures: None    Medications:    amLODipine  5 mg Oral Daily   aspirin EC  81 mg Oral Daily   atorvastatin  20 mg Oral Daily   citalopram  20 mg Oral QPM   doxycycline  100 mg Oral Q12H   enoxaparin (LOVENOX) injection  40 mg Subcutaneous Q24H   ipratropium-albuterol  3 mL Nebulization Q6H   isosorbide mononitrate  15 mg Oral Daily   loratadine  10 mg Oral Daily   losartan  100 mg Oral QPM   metoprolol succinate  25 mg Oral QPM   nicotine  7 mg Transdermal Daily   pantoprazole  40 mg Oral QPM   predniSONE  40 mg Oral Q breakfast   sodium chloride flush  3  mL Intravenous Q12H   ticagrelor  90 mg Oral BID   Continuous Infusions:  sodium chloride       Anti-infectives (From admission, onward)    Start     Dose/Rate Route Frequency Ordered Stop   02/19/21 1445  doxycycline (VIBRA-TABS) tablet 100 mg        100 mg Oral Every 12 hours 02/19/21 1430 02/24/21 0959              Family Communication/Anticipated D/C date and plan/Code Status   DVT prophylaxis: enoxaparin (LOVENOX) injection 40 mg Start: 02/23/21 0800     Code Status: Full Code  Family Communication: None Disposition Plan:    Status is:  Inpatient  Remains inpatient appropriate because:Inpatient level of care appropriate due to severity of illness  Dispo: The patient is from: Home              Anticipated d/c is to: Home              Patient currently is not medically stable to d/c.   Difficult to place patient No           Subjective:   Interval events noted.  She complained of chest pain earlier this morning.  No cough, shortness of breath or palpitations.  Overall, she feels better today.  Objective:    Vitals:   02/23/21 0755 02/23/21 0805 02/23/21 1138 02/23/21 1421  BP: 130/88  128/69   Pulse: 61  69   Resp: 17  17   Temp: 97.7 F (36.5 C)  98 F (36.7 C)   TempSrc: Oral     SpO2: 100%  98% 98%  Weight:  64.4 kg    Height:  5\' 1"  (1.549 m)     No data found.   Intake/Output Summary (Last 24 hours) at 02/23/2021 1633 Last data filed at 02/23/2021 1429 Gross per 24 hour  Intake 1499.33 ml  Output 3000 ml  Net -1500.67 ml   Filed Weights   02/19/21 1215 02/20/21 1906 02/23/21 0805  Weight: 64.4 kg 64.4 kg 64.4 kg    Exam:  GEN: NAD SKIN: Warm and dry EYES: EOMI ENT: MMM, neck collar in place CV: RRR PULM: CTA B ABD: soft, ND, NT, +BS CNS: AAO x 3, non focal EXT: No edema or tenderness         Data Reviewed:   I have personally reviewed following labs and imaging studies:  Labs: Labs show the following:   Basic Metabolic Panel: Recent Labs  Lab 02/19/21 1220 02/20/21 0505 02/22/21 0811 02/23/21 0443  NA 136 134* 132* 135  K 4.5 4.6 4.6 4.1  CL 106 104 102 99  CO2 25 25 23 28   GLUCOSE 116* 144* 116* 79  BUN 21 18 28* 32*  CREATININE 0.70 0.65 0.65 0.90  CALCIUM 8.4* 8.2* 8.7* 8.6*   GFR Estimated Creatinine Clearance: 50.7 mL/min (by C-G formula based on SCr of 0.9 mg/dL). Liver Function Tests: Recent Labs  Lab 02/19/21 1220  AST 59*  ALT 52*  ALKPHOS 55  BILITOT 0.4  PROT 6.8  ALBUMIN 3.7   No results for input(s): LIPASE, AMYLASE in the last  168 hours. No results for input(s): AMMONIA in the last 168 hours. Coagulation profile Recent Labs  Lab 02/21/21 1618  INR 1.0    CBC: Recent Labs  Lab 02/19/21 1504 02/20/21 0505 02/21/21 1618 02/22/21 0811 02/23/21 0443  WBC 11.0* 8.3 11.5* 12.6* 9.4  HGB 12.4 12.1 12.6 12.5  12.4  HCT 37.9 36.5 38.0 38.4 37.8  MCV 90.5 89.2 89.4 89.1 89.4  PLT 208 216 256 245 210   Cardiac Enzymes: No results for input(s): CKTOTAL, CKMB, CKMBINDEX, TROPONINI in the last 168 hours. BNP (last 3 results) No results for input(s): PROBNP in the last 8760 hours. CBG: Recent Labs  Lab 02/22/21 0733 02/23/21 0756 02/23/21 1136  GLUCAP 117* 84 111*   D-Dimer: No results for input(s): DDIMER in the last 72 hours. Hgb A1c: No results for input(s): HGBA1C in the last 72 hours. Lipid Profile: No results for input(s): CHOL, HDL, LDLCALC, TRIG, CHOLHDL, LDLDIRECT in the last 72 hours. Thyroid function studies: No results for input(s): TSH, T4TOTAL, T3FREE, THYROIDAB in the last 72 hours.  Invalid input(s): FREET3 Anemia work up: No results for input(s): VITAMINB12, FOLATE, FERRITIN, TIBC, IRON, RETICCTPCT in the last 72 hours. Sepsis Labs: Recent Labs  Lab 02/20/21 0505 02/21/21 1618 02/22/21 0811 02/23/21 0443  WBC 8.3 11.5* 12.6* 9.4    Microbiology Recent Results (from the past 240 hour(s))  SARS CORONAVIRUS 2 (TAT 6-24 HRS) Nasopharyngeal Nasopharyngeal Swab     Status: None   Collection Time: 02/19/21  7:25 PM   Specimen: Nasopharyngeal Swab  Result Value Ref Range Status   SARS Coronavirus 2 NEGATIVE NEGATIVE Final    Comment: (NOTE) SARS-CoV-2 target nucleic acids are NOT DETECTED.  The SARS-CoV-2 RNA is generally detectable in upper and lower respiratory specimens during the acute phase of infection. Negative results do not preclude SARS-CoV-2 infection, do not rule out co-infections with other pathogens, and should not be used as the sole basis for treatment or other  patient management decisions. Negative results must be combined with clinical observations, patient history, and epidemiological information. The expected result is Negative.  Fact Sheet for Patients: SugarRoll.be  Fact Sheet for Healthcare Providers: https://www.woods-mathews.com/  This test is not yet approved or cleared by the [place] FDA and  has been authorized for detection and/or diagnosis of SARS-CoV-2 by FDA under an Emergency Use Authorization (EUA). This EUA will remain  in effect (meaning this test can be used) for the duration of the COVID-19 declaration under Se ction 564(b)(1) of the Act, 21 U.S.C. section 360bbb-3(b)(1), unless the authorization is terminated or revoked sooner.  Performed at [facility], [place] [address] [place], [place] [address]     Procedures and diagnostic studies:  CARDIAC CATHETERIZATION  Result Date: 02/22/2021   Prox RCA to Mid RCA lesion is 60% stenosed.   Ost LAD to Prox LAD lesion is 25% stenosed.   Ost Cx to Prox Cx lesion is 40% stenosed.   1st Mrg lesion is 30% stenosed.   Prox LAD lesion is 99% stenosed.   A drug-eluting stent was successfully placed using a STENT ONYX FRONTIER 3.0X18.   Post intervention, there is a 0% residual stenosis.   There is moderate to severe left ventricular systolic dysfunction.   LV end diastolic pressure is severely elevated.   The left ventricular ejection fraction is 25-35% by visual estimate. 1.  Severe one-vessel coronary artery disease with 99% thrombotic stenosis in the proximal LAD which seems to be the culprit for non-ST elevation myocardial infarction.  In addition, there is mild to moderate left circumflex and RCA disease. 2.  Moderately to severely reduced LV systolic function with an EF of 25 to 30% with severe anterior/apical hypokinesis. 3.  Severely elevated left ventricular end-diastolic pressure at 40 mmHg. 4.  Successful balloon angioplasty,  aspiration  thrombectomy and drug-eluting stent placement to the proximal LAD.  The initial plan was to use intravascular lithotripsy given calcifications but the patient developed chest pain with sluggish flow in the LAD just before the PCI was started. Recommendations: Dual antiplatelet therapy for at least 12 months. Aggressive treatment of risk factors. The patient was given 1 dose of IV furosemide 20 mg during the case with good urine output.  Monitor respiratory status and consider additional diuresis as needed. Treat the rest of the coronary artery disease medically.   ECHOCARDIOGRAM COMPLETE  Result Date: 02/22/2021    ECHOCARDIOGRAM REPORT   Patient Name:   Terri Burns Date of Exam: 02/22/2021 Medical Rec #:  [id number]      Height:       61.0 in Accession #:    [id number]     Weight:       142.0 lb Date of Birth:  10/07/1950      BSA:          1.633 m Patient Age:    43 years       BP:           116/88 mmHg Patient Gender: F              HR:           67 bpm. Exam Location:  [facility] Procedure: 2D Echo, Color Doppler, Cardiac Doppler and Intracardiac            Opacification Agent Indications:     R06.00 Dyspnea  History:         Patient has no prior history of Echocardiogram examinations.                  COPD; Risk Factors:Hypertension and Dyslipidemia.  Sonographer:     Charmayne Sheer Referring Phys:  [id number] Robin Searing VISSER Diagnosing Phys: Kathlyn Sacramento MD  Sonographer Comments: Technically challenging study due to limited acoustic windows and Technically difficult study due to poor echo windows. Image acquisition challenging due to COPD and Image acquisition challenging due to respiratory motion. IMPRESSIONS  1. Left ventricular ejection fraction, by estimation, is 55 to 60%. The left ventricle has normal function. Left ventricular endocardial border not optimally defined to evaluate regional wall motion. Left ventricular diastolic parameters are consistent with Grade I diastolic dysfunction  (impaired relaxation).  2. Right ventricular systolic function is normal. The right ventricular size is normal. Tricuspid regurgitation signal is inadequate for assessing PA pressure.  3. The mitral valve is normal in structure. No evidence of mitral valve regurgitation. No evidence of mitral stenosis.  4. The aortic valve is normal in structure. Aortic valve regurgitation is not visualized. Mild to moderate aortic valve sclerosis/calcification is present, without any evidence of aortic stenosis.  5. The inferior vena cava is normal in size with greater than 50% respiratory variability, suggesting right atrial pressure of 3 mmHg. FINDINGS  Left Ventricle: Left ventricular ejection fraction, by estimation, is 55 to 60%. The left ventricle has normal function. Left ventricular endocardial border not optimally defined to evaluate regional wall motion. Definity contrast agent was given IV to delineate the left ventricular endocardial borders. The left ventricular internal cavity size was normal in size. There is no left ventricular hypertrophy. Left ventricular diastolic parameters are consistent with Grade I diastolic dysfunction (impaired relaxation). Right Ventricle: The right ventricular size is normal. No increase in right ventricular wall thickness. Right ventricular systolic function is normal. Tricuspid regurgitation signal is inadequate for assessing PA  pressure. Left Atrium: Left atrial size was normal in size. Right Atrium: Right atrial size was normal in size. Pericardium: There is no evidence of pericardial effusion. Mitral Valve: The mitral valve is normal in structure. No evidence of mitral valve regurgitation. No evidence of mitral valve stenosis. MV peak gradient, 3.1 mmHg. The mean mitral valve gradient is 1.0 mmHg. Tricuspid Valve: The tricuspid valve is normal in structure. Tricuspid valve regurgitation is not demonstrated. No evidence of tricuspid stenosis. Aortic Valve: The aortic valve is normal  in structure. Aortic valve regurgitation is not visualized. Mild to moderate aortic valve sclerosis/calcification is present, without any evidence of aortic stenosis. Aortic valve mean gradient measures 3.0 mmHg. Aortic valve peak gradient measures 5.7 mmHg. Aortic valve area, by VTI measures 2.17 cm. Pulmonic Valve: The pulmonic valve was normal in structure. Pulmonic valve regurgitation is not visualized. No evidence of pulmonic stenosis. Aorta: The aortic root is normal in size and structure. Venous: The inferior vena cava is normal in size with greater than 50% respiratory variability, suggesting right atrial pressure of 3 mmHg. IAS/Shunts: No atrial level shunt detected by color flow Doppler.  LEFT VENTRICLE PLAX 2D LVOT diam:     2.00 cm     Diastology LV SV:         46          LV e' medial:    4.24 cm/s LV SV Index:   28          LV E/e' medial:  11.7 LVOT Area:     3.14 cm    LV e' lateral:   6.20 cm/s                            LV E/e' lateral: 8.0  LV Volumes (MOD) LV vol d, MOD A4C: 78.4 ml LV vol s, MOD A4C: 33.4 ml LV SV MOD A4C:     78.4 ml LEFT ATRIUM             Index LA Vol (A2C):   56.3 ml 34.48 ml/m LA Vol (A4C):   40.4 ml 24.74 ml/m LA Biplane Vol: 48.6 ml 29.76 ml/m  AORTIC VALVE AV Area (Vmax):    2.19 cm AV Area (Vmean):   2.45 cm AV Area (VTI):     2.17 cm AV Vmax:           119.00 cm/s AV Vmean:          78.500 cm/s AV VTI:            0.213 m AV Peak Grad:      5.7 mmHg AV Mean Grad:      3.0 mmHg LVOT Vmax:         82.90 cm/s LVOT Vmean:        61.100 cm/s LVOT VTI:          0.147 m LVOT/AV VTI ratio: 0.69  AORTA Ao Root diam: 3.10 cm MITRAL VALVE MV Area (PHT): 4.24 cm    SHUNTS MV Area VTI:   2.23 cm    Systemic VTI:  0.15 m MV Peak grad:  3.1 mmHg    Systemic Diam: 2.00 cm MV Mean grad:  1.0 mmHg MV Vmax:       0.88 m/s MV Vmean:      44.9 cm/s MV Decel Time: 179 msec MV E velocity: 49.70 cm/s MV A velocity: 86.10 cm/s MV E/A ratio:  0.58 [employer]  MD Electronically  signed by Kathlyn Sacramento MD Signature Date/Time: 02/22/2021/2:11:37 PM    Final                LOS: 4 days   Terri Burns  [facility]   Pager on [email or web address]. If 7PM-7AM, please contact night-coverage at [email or web address]     02/23/2021, 4:33 PM

## 2021-02-23 NOTE — Progress Notes (Signed)
Progress Note  Patient Name: Terri Burns Date of Encounter: 02/23/2021  Primary Cardiologist: Dr. Fletcher Anon  Subjective   No CP since adding Imdur yesterday. Reports finally able to sleep last night, given her sweating finally stopped. Despite sleeping all night, she reports she is fatigued today. She reports weakness and dizziness with PT eval yesterday. She wants to regain her strength. Breathing improved. Still off of O2  Inpatient Medications    Scheduled Meds:  amLODipine  5 mg Oral Daily   aspirin EC  81 mg Oral Daily   atorvastatin  20 mg Oral Daily   citalopram  20 mg Oral QPM   doxycycline  100 mg Oral Q12H   enoxaparin (LOVENOX) injection  40 mg Subcutaneous Q24H   ipratropium-albuterol  3 mL Nebulization Q6H   loratadine  10 mg Oral Daily   losartan  100 mg Oral QPM   metoprolol succinate  25 mg Oral QPM   nicotine  7 mg Transdermal Daily   pantoprazole  40 mg Oral QPM   predniSONE  40 mg Oral Q breakfast   sodium chloride flush  3 mL Intravenous Q12H   ticagrelor  90 mg Oral BID   Continuous Infusions:  sodium chloride     PRN Meds: sodium chloride, acetaminophen **OR** acetaminophen, ALPRAZolam, fluticasone, HYDROcodone-acetaminophen, ipratropium-albuterol, morphine injection, ondansetron **OR** ondansetron (ZOFRAN) IV, sodium chloride flush   Vital Signs    Vitals:   02/23/21 0435 02/23/21 0755 02/23/21 0805 02/23/21 1138  BP: (!) 139/94 130/88  128/69  Pulse: 60 61  69  Resp: 20 17  17   Temp: (!) 97.5 F (36.4 C) 97.7 F (36.5 C)  98 F (36.7 C)  TempSrc: Oral Oral    SpO2: 99% 100%  98%  Weight:   64.4 kg   Height:   5\' 1"  (1.549 m)     Intake/Output Summary (Last 24 hours) at 02/23/2021 1153 Last data filed at 02/23/2021 1000 Gross per 24 hour  Intake 1256.33 ml  Output 2550 ml  Net -1293.67 ml   Last 3 Weights 02/23/2021 02/20/2021 02/19/2021  Weight (lbs) 141 lb 15.6 oz 142 lb 142 lb  Weight (kg) 64.4 kg 64.411 kg 64.411 kg       Telemetry    NSR, 50s to 60s- Personally Reviewed  ECG    No new tracings- Personally Reviewed  Physical Exam   GEN: No acute distress.  Cervical collar in place. Neck: Unable to assess JVD due to cervical collar Cardiac: RRR, 1/6 systolic murmur.  No rubs, or gallops.  Radial cath site without evidence of hematoma or infection.  Dressing clean, dry, intact. Respiratory: Diminished breath sounds bilaterally. GI: Soft, nontender, non-distended  MS: No edema; No deformity. Neuro:  Nonfocal  Psych: Normal affect   Labs    High Sensitivity Troponin:   Recent Labs  Lab 02/19/21 1220 02/20/21 0505 02/20/21 1014 02/21/21 0958 02/21/21 1151  TROPONINIHS 50* 129* 267* 258* 308*      Chemistry Recent Labs  Lab 02/19/21 1220 02/20/21 0505 02/22/21 0811 02/23/21 0443  NA 136 134* 132* 135  K 4.5 4.6 4.6 4.1  CL 106 104 102 99  CO2 25 25 23 28   GLUCOSE 116* 144* 116* 79  BUN 21 18 28* 32*  CREATININE 0.70 0.65 0.65 0.90  CALCIUM 8.4* 8.2* 8.7* 8.6*  PROT 6.8  --   --   --   ALBUMIN 3.7  --   --   --   AST 59*  --   --   --  ALT 52*  --   --   --   ALKPHOS 55  --   --   --   BILITOT 0.4  --   --   --   GFRNONAA >60 >60 >60 >60  ANIONGAP 5 5 7 8      Hematology Recent Labs  Lab 02/21/21 1618 02/22/21 0811 02/23/21 0443  WBC 11.5* 12.6* 9.4  RBC 4.25 4.31 4.23  HGB 12.6 12.5 12.4  HCT 38.0 38.4 37.8  MCV 89.4 89.1 89.4  MCH 29.6 29.0 29.3  MCHC 33.2 32.6 32.8  RDW 14.6 14.5 14.4  PLT 256 245 210    BNP Recent Labs  Lab 02/19/21 1220  BNP 152.9*     DDimer No results for input(s): DDIMER in the last 168 hours.   Radiology    CARDIAC CATHETERIZATION  Result Date: 02/22/2021   Prox RCA to Mid RCA lesion is 60% stenosed.   Ost LAD to Prox LAD lesion is 25% stenosed.   Ost Cx to Prox Cx lesion is 40% stenosed.   1st Mrg lesion is 30% stenosed.   Prox LAD lesion is 99% stenosed.   A drug-eluting stent was successfully placed using a STENT ONYX  FRONTIER 3.0X18.   Post intervention, there is a 0% residual stenosis.   There is moderate to severe left ventricular systolic dysfunction.   LV end diastolic pressure is severely elevated.   The left ventricular ejection fraction is 25-35% by visual estimate. 1.  Severe one-vessel coronary artery disease with 99% thrombotic stenosis in the proximal LAD which seems to be the culprit for non-ST elevation myocardial infarction.  In addition, there is mild to moderate left circumflex and RCA disease. 2.  Moderately to severely reduced LV systolic function with an EF of 25 to 30% with severe anterior/apical hypokinesis. 3.  Severely elevated left ventricular end-diastolic pressure at 40 mmHg. 4.  Successful balloon angioplasty, aspiration thrombectomy and drug-eluting stent placement to the proximal LAD.  The initial plan was to use intravascular lithotripsy given calcifications but the patient developed chest pain with sluggish flow in the LAD just before the PCI was started. Recommendations: Dual antiplatelet therapy for at least 12 months. Aggressive treatment of risk factors. The patient was given 1 dose of IV furosemide 20 mg during the case with good urine output.  Monitor respiratory status and consider additional diuresis as needed. Treat the rest of the coronary artery disease medically.   ECHOCARDIOGRAM COMPLETE  Result Date: 02/22/2021    ECHOCARDIOGRAM REPORT   Patient Name:   Terri Burns Date of Exam: 02/22/2021 Medical Rec #:  [id number]      Height:       61.0 in Accession #:    [id number]     Weight:       142.0 lb Date of Birth:  May 29, 1951      BSA:          1.633 m Patient Age:    36 years       BP:           116/88 mmHg Patient Gender: F              HR:           67 bpm. Exam Location:  [facility] Procedure: 2D Echo, Color Doppler, Cardiac Doppler and Intracardiac            Opacification Agent Indications:     R06.00 Dyspnea  History:  Patient has no prior history of Echocardiogram  examinations.                  COPD; Risk Factors:Hypertension and Dyslipidemia.  Sonographer:     Charmayne Sheer Referring Phys:  [id number] Robin Searing Ari Engelbrecht Diagnosing Phys: Kathlyn Sacramento MD  Sonographer Comments: Technically challenging study due to limited acoustic windows and Technically difficult study due to poor echo windows. Image acquisition challenging due to COPD and Image acquisition challenging due to respiratory motion. IMPRESSIONS  1. Left ventricular ejection fraction, by estimation, is 55 to 60%. The left ventricle has normal function. Left ventricular endocardial border not optimally defined to evaluate regional wall motion. Left ventricular diastolic parameters are consistent with Grade I diastolic dysfunction (impaired relaxation).  2. Right ventricular systolic function is normal. The right ventricular size is normal. Tricuspid regurgitation signal is inadequate for assessing PA pressure.  3. The mitral valve is normal in structure. No evidence of mitral valve regurgitation. No evidence of mitral stenosis.  4. The aortic valve is normal in structure. Aortic valve regurgitation is not visualized. Mild to moderate aortic valve sclerosis/calcification is present, without any evidence of aortic stenosis.  5. The inferior vena cava is normal in size with greater than 50% respiratory variability, suggesting right atrial pressure of 3 mmHg. FINDINGS  Left Ventricle: Left ventricular ejection fraction, by estimation, is 55 to 60%. The left ventricle has normal function. Left ventricular endocardial border not optimally defined to evaluate regional wall motion. Definity contrast agent was given IV to delineate the left ventricular endocardial borders. The left ventricular internal cavity size was normal in size. There is no left ventricular hypertrophy. Left ventricular diastolic parameters are consistent with Grade I diastolic dysfunction (impaired relaxation). Right Ventricle: The right ventricular size  is normal. No increase in right ventricular wall thickness. Right ventricular systolic function is normal. Tricuspid regurgitation signal is inadequate for assessing PA pressure. Left Atrium: Left atrial size was normal in size. Right Atrium: Right atrial size was normal in size. Pericardium: There is no evidence of pericardial effusion. Mitral Valve: The mitral valve is normal in structure. No evidence of mitral valve regurgitation. No evidence of mitral valve stenosis. MV peak gradient, 3.1 mmHg. The mean mitral valve gradient is 1.0 mmHg. Tricuspid Valve: The tricuspid valve is normal in structure. Tricuspid valve regurgitation is not demonstrated. No evidence of tricuspid stenosis. Aortic Valve: The aortic valve is normal in structure. Aortic valve regurgitation is not visualized. Mild to moderate aortic valve sclerosis/calcification is present, without any evidence of aortic stenosis. Aortic valve mean gradient measures 3.0 mmHg. Aortic valve peak gradient measures 5.7 mmHg. Aortic valve area, by VTI measures 2.17 cm. Pulmonic Valve: The pulmonic valve was normal in structure. Pulmonic valve regurgitation is not visualized. No evidence of pulmonic stenosis. Aorta: The aortic root is normal in size and structure. Venous: The inferior vena cava is normal in size with greater than 50% respiratory variability, suggesting right atrial pressure of 3 mmHg. IAS/Shunts: No atrial level shunt detected by color flow Doppler.  LEFT VENTRICLE PLAX 2D LVOT diam:     2.00 cm     Diastology LV SV:         46          LV e' medial:    4.24 cm/s LV SV Index:   28          LV E/e' medial:  11.7 LVOT Area:     3.14 cm    LV  e' lateral:   6.20 cm/s                            LV E/e' lateral: 8.0  LV Volumes (MOD) LV vol d, MOD A4C: 78.4 ml LV vol s, MOD A4C: 33.4 ml LV SV MOD A4C:     78.4 ml LEFT ATRIUM             Index LA Vol (A2C):   56.3 ml 34.48 ml/m LA Vol (A4C):   40.4 ml 24.74 ml/m LA Biplane Vol: 48.6 ml 29.76 ml/m   AORTIC VALVE AV Area (Vmax):    2.19 cm AV Area (Vmean):   2.45 cm AV Area (VTI):     2.17 cm AV Vmax:           119.00 cm/s AV Vmean:          78.500 cm/s AV VTI:            0.213 m AV Peak Grad:      5.7 mmHg AV Mean Grad:      3.0 mmHg LVOT Vmax:         82.90 cm/s LVOT Vmean:        61.100 cm/s LVOT VTI:          0.147 m LVOT/AV VTI ratio: 0.69  AORTA Ao Root diam: 3.10 cm MITRAL VALVE MV Area (PHT): 4.24 cm    SHUNTS MV Area VTI:   2.23 cm    Systemic VTI:  0.15 m MV Peak grad:  3.1 mmHg    Systemic Diam: 2.00 cm MV Mean grad:  1.0 mmHg MV Vmax:       0.88 m/s MV Vmean:      44.9 cm/s MV Decel Time: 179 msec MV E velocity: 49.70 cm/s MV A velocity: 86.10 cm/s MV E/A ratio:  0.58 Kathlyn Sacramento MD Electronically signed by Kathlyn Sacramento MD Signature Date/Time: 02/22/2021/2:11:37 PM    Final     Cardiac Studies   Cath Left Heart  Left Ventricle The left ventricle is mildly dilated. There is moderate to severe left ventricular systolic dysfunction. LV end diastolic pressure is severely elevated. The left ventricular ejection fraction is 25-35% by visual estimate. There are LV function abnormalities due to segmental dysfunction.   Coronary Diagrams  Diagnostic Dominance: Right Intervention   02/22/21   Prox RCA to Mid RCA lesion is 60% stenosed.   Ost LAD to Prox LAD lesion is 25% stenosed.   Ost Cx to Prox Cx lesion is 40% stenosed.   1st Mrg lesion is 30% stenosed.   Prox LAD lesion is 99% stenosed.   A drug-eluting stent was successfully placed using a STENT ONYX FRONTIER 3.0X18.   Post intervention, there is a 0% residual stenosis.   There is moderate to severe left ventricular systolic dysfunction.   LV end diastolic pressure is severely elevated.   The left ventricular ejection fraction is 25-35% by visual estimate.  1.  Severe one-vessel coronary artery disease with 99% thrombotic stenosis in the proximal LAD which seems to be the culprit for non-ST elevation myocardial  infarction.  In addition, there is mild to moderate left circumflex and RCA disease. 2.  Moderately to severely reduced LV systolic function with an EF of 25 to 30% with severe anterior/apical hypokinesis. 3.  Severely elevated left ventricular end-diastolic pressure at 40 mmHg. 4.  Successful balloon angioplasty, aspiration thrombectomy and drug-eluting stent placement to the  proximal LAD.  The initial plan was to use intravascular lithotripsy given calcifications but the patient developed chest pain with sluggish flow in the LAD just before the PCI was started. Recommendations: Dual antiplatelet therapy for at least 12 months. Aggressive treatment of risk factors. The patient was given 1 dose of IV furosemide 20 mg during the case with good urine output.  Monitor respiratory status and consider additional diuresis as needed. Treat the rest of the coronary artery disease medically.  Echo 02/22/21  1. Left ventricular ejection fraction, by estimation, is 55 to 60%. The  left ventricle has normal function. Left ventricular endocardial border  not optimally defined to evaluate regional wall motion. Left ventricular  diastolic parameters are consistent  with Grade I diastolic dysfunction (impaired relaxation).   2. Right ventricular systolic function is normal. The right ventricular  size is normal. Tricuspid regurgitation signal is inadequate for assessing  PA pressure.   3. The mitral valve is normal in structure. No evidence of mitral valve  regurgitation. No evidence of mitral stenosis.   4. The aortic valve is normal in structure. Aortic valve regurgitation is  not visualized. Mild to moderate aortic valve sclerosis/calcification is  present, without any evidence of aortic stenosis.   5. The inferior vena cava is normal in size with greater than 50%  respiratory variability, suggesting right atrial pressure of 3 mmHg.    Patient Profile     70 y.o. female with history of COPD, current  tobacco use, hypertension, hyperlipidemia, and no previously known history of heart disease or arrhythmia, and who is being seen 02/22/2021 for the evaluation of chest pain and elevated troponin s/p LHC.  Assessment & Plan    Non-STEMI -- No current CP, as this improved with addition of low-dose Imdur yesterday.  Of notes, she also reports improvement and night sweats.  HS Tn 50  267  258  308.  EKG with Q waves in the inferior lateral leads/TWI of precordial/anterior leads.  LHC performed 9/19 with severe 1V CAD and 99%s of pLAD, thought the likely culprit for NSTEMI.  Mild to moderate Lcx and RCA disease also noted.  Balloon angioplasty and DES placed to the proximal LAD.   Continue medical management with DAPT with ASA and Brilinta for at least 12 months.  Continue PPI. Continue Toprol, statin, Imdur, and as needed sublingual nitro as needed for chest pain.   Aggressive treatment of risk factors. AVS to be updated with post cath instructions / TCM msg sent. Will need to ambulate before discharge / PT clearance given report of weakness and unsteadiness. Follow-up recommended in the office within 1 to 2 weeks of discharge.  AOC HFpEF  --Reports improved SOB s/p diuresis. By cath EF 25 to 30% with severe anterior/apical hypokinesis, though noted to be 55-60% by echo.  Consider clarification of EF with MD versus repeat echo to confirm EF at follow-up. By cath, LVEDP 40 mmHg with recommendations for diuresis as below.  S/p 1 dose of IV Lasix 20 mg on 9/19 and diuresed well. Will order BMET today to reassess as none yet ordered. Output -5.4L and -5.8L over the last two days, despite no further diuresis. For now, continue current medications. Will hold off on adding an oral diuretic. Monitor I/O, daily wt.  Continue losartan, Imdur, Toprol. CHF education.  Essential hypertension - BP well controlled.  Continue amlodipine, losartan, Imdur, metoprolol.   COPD -- Ideally, wean off of prednisone.   Continue oxygen and breathing treatments as  needed.   Tobacco use --Complete smoking cessation advised.  She remains on a nicotine patch at this time.  For questions or updates, please contact [place] Please consult [email or web address] for contact info under        Signed, Arvil Chaco, PA-C  02/23/2021, 11:53 AM

## 2021-02-24 ENCOUNTER — Telehealth: Payer: Self-pay

## 2021-02-24 DIAGNOSIS — J441 Chronic obstructive pulmonary disease with (acute) exacerbation: Secondary | ICD-10-CM | POA: Diagnosis not present

## 2021-02-24 DIAGNOSIS — I10 Essential (primary) hypertension: Secondary | ICD-10-CM | POA: Diagnosis not present

## 2021-02-24 DIAGNOSIS — I214 Non-ST elevation (NSTEMI) myocardial infarction: Secondary | ICD-10-CM | POA: Diagnosis not present

## 2021-02-24 LAB — BASIC METABOLIC PANEL
Anion gap: 4 — ABNORMAL LOW (ref 5–15)
BUN: 26 mg/dL — ABNORMAL HIGH (ref 8–23)
CO2: 28 mmol/L (ref 22–32)
Calcium: 8.4 mg/dL — ABNORMAL LOW (ref 8.9–10.3)
Chloride: 101 mmol/L (ref 98–111)
Creatinine, Ser: 0.67 mg/dL (ref 0.44–1.00)
GFR, Estimated: >60 mL/min (ref >60)
Glucose, Bld: 93 mg/dL (ref 70–99)
Potassium: 4.2 mmol/L (ref 3.5–5.1)
Sodium: 133 mmol/L — ABNORMAL LOW (ref 135–145)

## 2021-02-24 LAB — GLUCOSE, CAPILLARY
Glucose-Capillary: 104 mg/dL — ABNORMAL HIGH (ref 70–99)
Glucose-Capillary: 104 mg/dL — ABNORMAL HIGH (ref 70–99)

## 2021-02-24 LAB — BRAIN NATRIURETIC PEPTIDE: B Natriuretic Peptide: 76.8 pg/mL (ref 0.0–100.0)

## 2021-02-24 NOTE — Progress Notes (Addendum)
Progress Note  Patient Name: Terri Burns Date of Encounter: 02/24/2021  [facility] Cardiologist: None   Subjective   Patient doing okay, denies chest pain or shortness of breath.  Felt dizzy while working physical therapy yesterday.  Hoping she will be able to do more today.    Inpatient Medications    Scheduled Meds:  amLODipine  5 mg Oral Daily   aspirin EC  81 mg Oral Daily   atorvastatin  20 mg Oral Daily   citalopram  20 mg Oral QPM   enoxaparin (LOVENOX) injection  40 mg Subcutaneous Q24H   ipratropium-albuterol  3 mL Nebulization Q6H   isosorbide mononitrate  15 mg Oral Daily   loratadine  10 mg Oral Daily   losartan  100 mg Oral QPM   metoprolol succinate  25 mg Oral QPM   nicotine  7 mg Transdermal Daily   pantoprazole  40 mg Oral QPM   predniSONE  40 mg Oral Q breakfast   sodium chloride flush  3 mL Intravenous Q12H   ticagrelor  90 mg Oral BID   Continuous Infusions:  sodium chloride     PRN Meds: sodium chloride, acetaminophen **OR** acetaminophen, ALPRAZolam, fluticasone, HYDROcodone-acetaminophen, ipratropium-albuterol, morphine injection, ondansetron **OR** ondansetron (ZOFRAN) IV, sodium chloride flush   Vital Signs    Vitals:   02/24/21 0752 02/24/21 0831 02/24/21 1244 02/24/21 1352  BP:  126/84 120/84   Pulse:  67 68   Resp:  17 17   Temp:  97.9 F (36.6 C) 97.9 F (36.6 C)   TempSrc:      SpO2: 97% 99% 100% 98%  Weight:      Height:        Intake/Output Summary (Last 24 hours) at 02/24/2021 1416 Last data filed at 02/24/2021 1245 Gross per 24 hour  Intake 1340 ml  Output 2200 ml  Net -860 ml   Last 3 Weights 02/23/2021 02/20/2021 02/19/2021  Weight (lbs) 141 lb 15.6 oz 142 lb 142 lb  Weight (kg) 64.4 kg 64.411 kg 64.411 kg      Telemetry    Sinus rhythm- Personally Reviewed  ECG     - Personally Reviewed  Physical Exam   GEN: No acute distress.   Neck: Cervical collar noted Cardiac: RRR, no murmurs, rubs, or gallops.   Respiratory: Expiratory wheezing GI: Soft, nontender, non-distended  MS: No edema; No deformity. Neuro:  Nonfocal  Psych: Normal affect   Labs    High Sensitivity Troponin:   Recent Labs  Lab 02/19/21 1220 02/20/21 0505 02/20/21 1014 02/21/21 0958 02/21/21 1151  TROPONINIHS 50* 129* 267* 258* 308*     Chemistry Recent Labs  Lab 02/19/21 1220 02/20/21 0505 02/22/21 0811 02/23/21 0443 02/24/21 1135  NA 136   < > 132* 135 133*  K 4.5   < > 4.6 4.1 4.2  CL 106   < > 102 99 101  CO2 25   < > 23 28 28   GLUCOSE 116*   < > 116* 79 93  BUN 21   < > 28* 32* 26*  CREATININE 0.70   < > 0.65 0.90 0.67  CALCIUM 8.4*   < > 8.7* 8.6* 8.4*  PROT 6.8  --   --   --   --   ALBUMIN 3.7  --   --   --   --   AST 59*  --   --   --   --   ALT 52*  --   --   --   --  ALKPHOS 55  --   --   --   --   BILITOT 0.4  --   --   --   --   GFRNONAA >60   < > >60 >60 >60  ANIONGAP 5   < > 7 8 4*   < > = values in this interval not displayed.    Lipids No results for input(s): CHOL, TRIG, HDL, LABVLDL, LDLCALC, CHOLHDL in the last 168 hours.  Hematology Recent Labs  Lab 02/21/21 1618 02/22/21 0811 02/23/21 0443  WBC 11.5* 12.6* 9.4  RBC 4.25 4.31 4.23  HGB 12.6 12.5 12.4  HCT 38.0 38.4 37.8  MCV 89.4 89.1 89.4  MCH 29.6 29.0 29.3  MCHC 33.2 32.6 32.8  RDW 14.6 14.5 14.4  PLT 256 245 210   Thyroid No results for input(s): TSH, FREET4 in the last 168 hours.  BNP Recent Labs  Lab 02/19/21 1220 02/24/21 1135  BNP 152.9* 76.8    DDimer No results for input(s): DDIMER in the last 168 hours.   Radiology    CARDIAC CATHETERIZATION  Result Date: 02/22/2021   Prox RCA to Mid RCA lesion is 60% stenosed.   Ost LAD to Prox LAD lesion is 25% stenosed.   Ost Cx to Prox Cx lesion is 40% stenosed.   1st Mrg lesion is 30% stenosed.   Prox LAD lesion is 99% stenosed.   A drug-eluting stent was successfully placed using a STENT ONYX FRONTIER 3.0X18.   Post intervention, there is a 0% residual  stenosis.   There is moderate to severe left ventricular systolic dysfunction.   LV end diastolic pressure is severely elevated.   The left ventricular ejection fraction is 25-35% by visual estimate. 1.  Severe one-vessel coronary artery disease with 99% thrombotic stenosis in the proximal LAD which seems to be the culprit for non-ST elevation myocardial infarction.  In addition, there is mild to moderate left circumflex and RCA disease. 2.  Moderately to severely reduced LV systolic function with an EF of 25 to 30% with severe anterior/apical hypokinesis. 3.  Severely elevated left ventricular end-diastolic pressure at 40 mmHg. 4.  Successful balloon angioplasty, aspiration thrombectomy and drug-eluting stent placement to the proximal LAD.  The initial plan was to use intravascular lithotripsy given calcifications but the patient developed chest pain with sluggish flow in the LAD just before the PCI was started. Recommendations: Dual antiplatelet therapy for at least 12 months. Aggressive treatment of risk factors. The patient was given 1 dose of IV furosemide 20 mg during the case with good urine output.  Monitor respiratory status and consider additional diuresis as needed. Treat the rest of the coronary artery disease medically.    Cardiac Studies   Lhc 02/22/2021   Prox RCA to Mid RCA lesion is 60% stenosed.   Ost LAD to Prox LAD lesion is 25% stenosed.   Ost Cx to Prox Cx lesion is 40% stenosed.   1st Mrg lesion is 30% stenosed.   Prox LAD lesion is 99% stenosed.   A drug-eluting stent was successfully placed using a STENT ONYX FRONTIER 3.0X18.   Post intervention, there is a 0% residual stenosis.   There is moderate to severe left ventricular systolic dysfunction.   LV end diastolic pressure is severely elevated.   The left ventricular ejection fraction is 25-35% by visual estimate.   Echo 02/22/2021  1. Left ventricular ejection fraction, by estimation, is 55 to 60%. The  left ventricle has  normal function. Left  ventricular endocardial border  not optimally defined to evaluate regional wall motion. Left ventricular  diastolic parameters are consistent  with Grade I diastolic dysfunction (impaired relaxation).   2. Right ventricular systolic function is normal. The right ventricular  size is normal. Tricuspid regurgitation signal is inadequate for assessing  PA pressure.   3. The mitral valve is normal in structure. No evidence of mitral valve  regurgitation. No evidence of mitral stenosis.   4. The aortic valve is normal in structure. Aortic valve regurgitation is  not visualized. Mild to moderate aortic valve sclerosis/calcification is  present, without any evidence of aortic stenosis.   5. The inferior vena cava is normal in size with greater than 50%  respiratory variability, suggesting right atrial pressure of 3 mmHg.   Patient Profile     70 y.o. female with history of COPD, tobacco use, hypertension, hyperlipidemia presenting with chest pain.  Diagnosed with NSTEMI, left heart cath showing significant proximal LAD stenosis, moderate RCA disease.  S/p PCI to proximal LAD.  Assessment & Plan    NSTEMI, CAD -Denies chest pain -S/p PCI to proximal LAD -Aspirin, Brilinta, Lipitor, Toprol-XL -EF is preserved  2.  Hypertension -BP controlled -Toprol-XL, losartan, Lovenox.  3.  Smoking -Cessation advised   Total encounter time 35 minutes  Greater than 50% was spent in counseling and coordination of care with the patient    Signed, Kate Sable, MD  02/24/2021, 2:16 PM

## 2021-02-24 NOTE — TOC Initial Note (Signed)
Transition of Care [facility]) - Initial/Assessment Note    Patient Details  Name: Terri Burns MRN: [id number] Date of Birth: 1950/06/30  Transition of Care [facility]) CM/SW Contact:    Alberteen Sam, LCSW Phone Number: 02/24/2021, 2:14 PM  Clinical Narrative:                  CSW notes patient is agreeable to [facility] PT and RN at discharge. Will need home health orders.   Pending medical readiness to dc.   TOC following for discharge needs.   Expected Discharge Plan: [place] Barriers to Discharge: Continued Medical Work up   Patient Goals and CMS Choice Patient states their goals for this hospitalization and ongoing recovery are:: to go home CMS Medicare.gov Compare Post Acute Care list provided to:: Patient Choice offered to / list presented to : Patient  Expected Discharge Plan and Services Expected Discharge Plan: [place]                                   HH Arranged: PT, RN [facility] Agency: [place] ([place]) Date [facility] Agency Contacted: 02/24/21 Time HH Agency Contacted: 1414 Representative spoke with at [place]: Corene Cornea  Prior Living Arrangements/Services   Lives with:: Self Patient language and need for interpreter reviewed:: Yes Do you feel safe going back to the place where you live?: Yes      Need for Family Participation in Patient Care: Yes (Comment) Care giver support system in place?: Yes (comment)   Criminal Activity/Legal Involvement Pertinent to Current Situation/Hospitalization: No - Comment as needed  Activities of Daily Living Home Assistive Devices/Equipment: Wheelchair ADL Screening (condition at time of admission) Patient's cognitive ability adequate to safely complete daily activities?: Yes Is the patient deaf or have difficulty hearing?: No Does the patient have difficulty seeing, even when wearing glasses/contacts?: No Does the patient have difficulty concentrating, remembering,  or making decisions?: No Patient able to express need for assistance with ADLs?: Yes Does the patient have difficulty dressing or bathing?: Yes Independently performs ADLs?: No Communication: Independent Dressing (OT): Needs assistance Is this a change from baseline?: Change from baseline, expected to last <3days Grooming: Needs assistance Is this a change from baseline?: Change from baseline, expected to last <3 days Feeding: Independent Bathing: Needs assistance Is this a change from baseline?: Change from baseline, expected to last <3 days Toileting: Needs assistance Is this a change from baseline?: Change from baseline, expected to last <3 days In/Out Bed: Needs assistance Is this a change from baseline?: Change from baseline, expected to last <3 days Walks in Home: Independent with device (comment) Does the patient have difficulty walking or climbing stairs?: Yes Weakness of Legs: Both Weakness of Arms/Hands: Both  Permission Sought/Granted         Permission granted to share info w AGENCY: HH        Emotional Assessment Appearance:: Appears stated age Attitude/Demeanor/Rapport: Gracious Affect (typically observed): Calm Orientation: : Oriented to Self, Oriented to Place, Oriented to  Time, Oriented to Situation Alcohol / Substance Use: Not Applicable Psych Involvement: No (comment)  Admission diagnosis:  COPD exacerbation ([place]) [J44.1] Acute respiratory failure with hypoxia ([place]) [J96.01] COPD with acute exacerbation ([place]) [J44.1] Patient Active Problem List   Diagnosis Date Noted   Non-ST elevation (NSTEMI) myocardial infarction [facility])    COPD with acute exacerbation ([place]) 02/19/2021  Respiratory distress    Depression 09/13/2020   COPD exacerbation ([place]) 09/13/2020   Acute respiratory failure with hypoxia ([place]) 09/13/2020   HLD (hyperlipidemia) 09/13/2020   Skin lesion of face 05/01/2018   Atherosclerosis of aorta ([place]) 03/04/2018   Coronary artery  calcification of native artery 03/04/2018   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 [place] [address] Phone: [phone] Fax: [phone]  [id number] - [place], [place] [address] [place] [address] Phone: [phone] Fax: [phone]     Social Determinants of Health (SDOH) Interventions    Readmission Risk Interventions No flowsheet data found.

## 2021-02-24 NOTE — Discharge Instructions (Signed)
You have received care from [facility] during this hospital stay and we look forward to continuing to provide you with excellent care in our office settings after you've left the hospital.  In order to assure a smoother transition to home following your discharge from the hospital, we will likely have you see one of our nurse practitioners or physician assistants within a few weeks of discharge.  Our advanced practice providers work closely with your physician in order to address all of your heart's needs in a timely manner.  More information about all of our providers may be found here: [email or web address]   Please plan to bring all of your prescriptions to your follow-up appointment and don't hesitate to contact us with questions or concerns.  [place] [facility] [address] [place], [place] [address] --------------------------------------  Catheterization Site Care Refer to this sheet in the next few weeks. These instructions provide you with information on caring for yourself after your procedure. Your caregiver may also give you more specific instructions. Your treatment has been planned according to current medical practices, but problems sometimes occur. Call your caregiver if you have any problems or questions after your procedure. HOME CARE INSTRUCTIONS You may shower the day after the procedure. Remove the bandage (dressing) and gently wash the site with plain soap and water. Gently pat the site dry.  Do not apply powder or lotion to the site.  Do not submerge the affected site in water for 3 to 5 days.  Inspect the site at least twice daily.  Do not flex or bend the affected arm for 24 hours.  No lifting over 5 pounds (2.3 kg) for 5 days after your procedure.  What to expect: Any bruising will usually fade within 1 to 2 weeks.  Blood that collects in the tissue  (hematoma) may be painful to the touch. It should usually decrease in size and tenderness within 1 to 2 weeks.  SEEK IMMEDIATE MEDICAL CARE IF: You have unusual pain at the radial site.  You have redness, warmth, swelling, or pain at the radial site.  You have drainage (other than a small amount of blood on the dressing).  You have chills.  You have a fever or persistent symptoms for more than 72 hours.  You have a fever and your symptoms suddenly get worse.  Your arm becomes pale, cool, tingly, or numb.  You have heavy bleeding from the site. Hold pressure on the site.       10 Habits of Highly Healthy [place] wants to help you get well and stay well.  Live a longer, healthier life by practicing healthy habits every day.  1.  Visit your primary care provider regularly. 2.  Make time for family and friends.  Healthy relationships are important. 3.  Take medications as directed by your provider. 4.  Maintain a healthy weight and a trim waistline. 5.  Eat healthy meals and snacks, rich in fruits, vegetables, whole grains, and lean proteins. 6.  Get moving every day - aim for 150 minutes of moderate physical activity each week. 7.  Don't smoke. 8.  Avoid alcohol or drink in moderation. 9.  Manage stress through meditation or mindful relaxation. 10.  Get seven to nine hours of quality sleep each night.  Want more information on healthy habits?  To learn more about these and other healthy habits, visit SecuritiesCard.it.   1. Follow a low-salt  diet - you are allowed no more than 2,000mg  of sodium per day. Watch your fluid intake. In general, you should not be taking in more than 2 liters of fluid per day (no more than 8 glasses per day). This includes sources of water in foods like soup, coffee, tea, milk, etc. 2. Weigh yourself on the same scale at same time of day and keep a log. 3. Call your doctor: (Anytime you feel any of the following symptoms)  - 3lb weight gain  overnight or 5lb within a few days - Shortness of breath, with or without a dry hacking cough  - Swelling in the hands, feet or stomach  - If you have to sleep on extra pillows at night in order to breathe   IT IS IMPORTANT TO LET YOUR DOCTOR KNOW EARLY ON IF YOU ARE HAVING SYMPTOMS SO WE CAN HELP YOU!

## 2021-02-24 NOTE — Progress Notes (Signed)
PROGRESS NOTE    Terri Burns   OAC:[id number]  DOB: Nov 01, 1950  PCP: Steele Sizer, MD    DOA: 02/19/2021 LOS: 5    Brief Narrative / Hospital Course to Date:   69 year old female with past medical history of COPD, hypertension, GERD, depression, hyperlipidemia, tobacco use disorder, history of C2 odontoid cervical fracture who presented to the ED with wheezing and shortness of breath.  She was noted to have hypoxia with O2 sat of 50% on room air on EMS arrival.  She was admitted for management of acute COPD exacerbation with acute respiratory failure with hypoxia and treated with steroids, bronchodilators and doxycycline.  She initially required BiPAP and since transitioned to nasal cannula oxygen which has since been weaned off.  During admission, patient developed chest pain and tightness.  Labs notable for rising troponin and nonspecific ST-T wave changes on EKG.  She was started on heparin infusion for acute non-STEMI.  Cardiology was consulted and took patient to the Cath Lab where severe proximal LAD stenosis (99%) and moderate RCA disease (60% stenosis) were noted.  Drug-eluting stent was placed to the proximal LAD.  Assessment & Plan   Active Problems:   COPD with acute exacerbation (HCC)   Non-ST elevation (NSTEMI) myocardial infarction ([place])  Acute Non-STEMI -status post left heart cath and stent to proximal LAD.  No intervention to the moderate RCA lesion. --Management per cardiology --Continue aspirin, Brilinta, metoprolol and statin --Aggressive risk factor reduction --If having persistent chest pain, cardiology considering repeat cath to address RCA lesion --Ambulate mobilize as tolerated  Generalized weakness and dizziness on standing /orthostatic -continue PT and OT.  Up out of bed mobilize as tolerated.  Daily orthostatic vitals.  COPD with acute exacerbation -clinically improved, no wheezing on exam today.  Continue steroid, bronchodilator doxycycline.  Wean  off steroids.  Acute respiratory failure with hypoxia -resolved, weaned off oxygen to room air.  Initially required BiPAP.  Anxiety -continue as needed Xanax  History of C2 odontoid fracture -patient wears cervical collar but may remove it, uses brace for support due to neck weakness.  Other chronic comorbidities: Hypertension, depression, hyperlipidemia, GERD-continue home medications.  Patient BMI: Body mass index is 26.83 kg/m.   DVT prophylaxis: enoxaparin (LOVENOX) injection 40 mg Start: 02/23/21 0800   Diet:  Diet Orders (From admission, onward)     Start     Ordered   02/22/21 1713  Diet Heart Room service appropriate? Yes; Fluid consistency: Thin  Diet effective now       Question Answer Comment  Room service appropriate? Yes   Fluid consistency: Thin      02/22/21 1712              Code Status: Full Code   Subjective 02/24/21    Patient seen awake sitting up in bed today.  She had some recurrence of chest pain yesterday.  This morning she reports ongoing twinges of chest pain that last only seconds.  She feels extremely tired and generally weak.  Reports getting very dizzy when upright working with therapy yesterday.  Hopes that this is improved when they see her again today.   Disposition Plan & Communication   Status is: Inpatient  Remains inpatient appropriate because: Patient orthostatic with dizziness on standing , requires further monitoring prior to safe discharge due to high risk of fall and injury.  Dispo: The patient is from: Home              Anticipated d/c  is to: Home              Patient currently is not medically stable to d/c.   Difficult to place patient No   Consults, Procedures, Significant Events   Consultants:  Cardiology  Procedures:  Left heart cath 9/19 -PCI to proximal LAD stenosis  Antimicrobials:  Anti-infectives (From admission, onward)    Start     Dose/Rate Route Frequency Ordered Stop   02/19/21 1445  doxycycline  (VIBRA-TABS) tablet 100 mg        100 mg Oral Every 12 hours 02/19/21 1430 02/23/21 2220         Micro    Objective   Vitals:   02/24/21 0831 02/24/21 1244 02/24/21 1352 02/24/21 1506  BP: 126/84 120/84  104/72  Pulse: 67 68  74  Resp: 17 17  16   Temp: 97.9 F (36.6 C) 97.9 F (36.6 C)    TempSrc:      SpO2: 99% 100% 98% 100%  Weight:      Height:        Intake/Output Summary (Last 24 hours) at 02/24/2021 1533 Last data filed at 02/24/2021 1423 Gross per 24 hour  Intake 1340 ml  Output 2200 ml  Net -860 ml   Filed Weights   02/19/21 1215 02/20/21 1906 02/23/21 0805  Weight: 64.4 kg 64.4 kg 64.4 kg    Physical Exam:  General exam: awake, alert, no acute distress, chronically ill-appearing HEENT: wearing cervical brace, moist mucus membranes, hearing grossly normal  Respiratory system: Lungs generally clear but diminished throughout, no wheezes heard, normal respiratory effort, on room air. Cardiovascular system: normal S1/S2, RRR,  no pedal edema.   Gastrointestinal system: soft, NT, ND, no HSM felt, +bowel sounds. Central nervous system: A&O x3. no gross focal neurologic deficits, normal speech Extremities: moves all, no edema, normal tone Skin: dry, intact, normal temperature, normal color, No rashes, lesions or ulcers seen on visualized skin Psychiatry: normal mood, congruent affect, judgement and insight appear normal  Labs   Data Reviewed: I have personally reviewed following labs and imaging studies  CBC: Recent Labs  Lab 02/19/21 1504 02/20/21 0505 02/21/21 1618 02/22/21 0811 02/23/21 0443  WBC 11.0* 8.3 11.5* 12.6* 9.4  HGB 12.4 12.1 12.6 12.5 12.4  HCT 37.9 36.5 38.0 38.4 37.8  MCV 90.5 89.2 89.4 89.1 89.4  PLT 208 216 256 245 826   Basic Metabolic Panel: Recent Labs  Lab 02/19/21 1220 02/20/21 0505 02/22/21 0811 02/23/21 0443 02/24/21 1135  NA 136 134* 132* 135 133*  K 4.5 4.6 4.6 4.1 4.2  CL 106 104 102 99 101  CO2 25 25 23 28 28    GLUCOSE 116* 144* 116* 79 93  BUN 21 18 28* 32* 26*  CREATININE 0.70 0.65 0.65 0.90 0.67  CALCIUM 8.4* 8.2* 8.7* 8.6* 8.4*   GFR: Estimated Creatinine Clearance: 57 mL/min (by C-G formula based on SCr of 0.67 mg/dL). Liver Function Tests: Recent Labs  Lab 02/19/21 1220  AST 59*  ALT 52*  ALKPHOS 55  BILITOT 0.4  PROT 6.8  ALBUMIN 3.7   No results for input(s): LIPASE, AMYLASE in the last 168 hours. No results for input(s): AMMONIA in the last 168 hours. Coagulation Profile: Recent Labs  Lab 02/21/21 1618  INR 1.0   Cardiac Enzymes: No results for input(s): CKTOTAL, CKMB, CKMBINDEX, TROPONINI in the last 168 hours. BNP (last 3 results) No results for input(s): PROBNP in the last 8760 hours. HbA1C: No results for input(s):  HGBA1C in the last 72 hours. CBG: Recent Labs  Lab 02/23/21 1136 02/23/21 1657 02/23/21 2111 02/24/21 0834 02/24/21 1243  GLUCAP 111* 103* 135* 104* 104*   Lipid Profile: No results for input(s): CHOL, HDL, LDLCALC, TRIG, CHOLHDL, LDLDIRECT in the last 72 hours. Thyroid Function Tests: No results for input(s): TSH, T4TOTAL, FREET4, T3FREE, THYROIDAB in the last 72 hours. Anemia Panel: No results for input(s): VITAMINB12, FOLATE, FERRITIN, TIBC, IRON, RETICCTPCT in the last 72 hours. Sepsis Labs: No results for input(s): PROCALCITON, LATICACIDVEN in the last 168 hours.  Recent Results (from the past 240 hour(s))  SARS CORONAVIRUS 2 (TAT 6-24 HRS) Nasopharyngeal Nasopharyngeal Swab     Status: None   Collection Time: 02/19/21  7:25 PM   Specimen: Nasopharyngeal Swab  Result Value Ref Range Status   SARS Coronavirus 2 NEGATIVE NEGATIVE Final    Comment: (NOTE) SARS-CoV-2 target nucleic acids are NOT DETECTED.  The SARS-CoV-2 RNA is generally detectable in upper and lower respiratory specimens during the acute phase of infection. Negative results do not preclude SARS-CoV-2 infection, do not rule out co-infections with other pathogens, and  should not be used as the sole basis for treatment or other patient management decisions. Negative results must be combined with clinical observations, patient history, and epidemiological information. The expected result is Negative.  Fact Sheet for Patients: SugarRoll.be  Fact Sheet for Healthcare Providers: https://www.woods-mathews.com/  This test is not yet approved or cleared by the [place] FDA and  has been authorized for detection and/or diagnosis of SARS-CoV-2 by FDA under an Emergency Use Authorization (EUA). This EUA will remain  in effect (meaning this test can be used) for the duration of the COVID-19 declaration under Se ction 564(b)(1) of the Act, 21 U.S.C. section 360bbb-3(b)(1), unless the authorization is terminated or revoked sooner.  Performed at [facility], [place] [address], [place], [place] [address]       Imaging Studies   CARDIAC CATHETERIZATION  Result Date: 02/22/2021   Prox RCA to Mid RCA lesion is 60% stenosed.   Ost LAD to Prox LAD lesion is 25% stenosed.   Ost Cx to Prox Cx lesion is 40% stenosed.   1st Mrg lesion is 30% stenosed.   Prox LAD lesion is 99% stenosed.   A drug-eluting stent was successfully placed using a STENT ONYX FRONTIER [id number].   Post intervention, there is a 0% residual stenosis.   There is moderate to severe left ventricular systolic dysfunction.   LV end diastolic pressure is severely elevated.   The left ventricular ejection fraction is 25-35% by visual estimate. 1.  Severe one-vessel coronary artery disease with 99% thrombotic stenosis in the proximal LAD which seems to be the culprit for non-ST elevation myocardial infarction.  In addition, there is mild to moderate left circumflex and RCA disease. 2.  Moderately to severely reduced LV systolic function with an EF of 25 to 30% with severe anterior/apical hypokinesis. 3.  Severely elevated left ventricular end-diastolic pressure at  40 mmHg. 4.  Successful balloon angioplasty, aspiration thrombectomy and drug-eluting stent placement to the proximal LAD.  The initial plan was to use intravascular lithotripsy given calcifications but the patient developed chest pain with sluggish flow in the LAD just before the PCI was started. Recommendations: Dual antiplatelet therapy for at least 12 months. Aggressive treatment of risk factors. The patient was given 1 dose of IV furosemide 20 mg during the case with good urine output.  Monitor respiratory status and consider additional  diuresis as needed. Treat the rest of the coronary artery disease medically.     Medications   Scheduled Meds:  amLODipine  5 mg Oral Daily   aspirin EC  81 mg Oral Daily   atorvastatin  20 mg Oral Daily   citalopram  20 mg Oral QPM   enoxaparin (LOVENOX) injection  40 mg Subcutaneous Q24H   ipratropium-albuterol  3 mL Nebulization Q6H   isosorbide mononitrate  15 mg Oral Daily   loratadine  10 mg Oral Daily   losartan  100 mg Oral QPM   metoprolol succinate  25 mg Oral QPM   nicotine  7 mg Transdermal Daily   pantoprazole  40 mg Oral QPM   predniSONE  40 mg Oral Q breakfast   sodium chloride flush  3 mL Intravenous Q12H   ticagrelor  90 mg Oral BID   Continuous Infusions:  sodium chloride         LOS: 5 days    Time spent: 30 minutes    Ezekiel Slocumb, DO [facility]  02/24/2021, 3:33 PM      If 7PM-7AM, please contact night-coverage. How to contact the [facility] Attending or Consulting provider [place] or covering provider during after hours [place], for this patient?    Check the care team in [facility] and look for a) attending/consulting [facility] provider listed and b) the [facility] team listed Log into [email or web address] and use [place]'s universal password to access. If you do not have the password, please contact the hospital operator. Locate the [facility] provider you are looking for under [facility] and page to a number that you can be  directly reached. If you still have difficulty reaching the provider, please page the [facility] (Director on Call) for the Hospitalists listed on amion for assistance.

## 2021-02-24 NOTE — Telephone Encounter (Signed)
-----   Message from Eli Phillips sent at 02/24/2021 12:32 PM EDT ----- Regarding: FW: TCM 10/6 at 2:30 with Marrianne Mood, PA ----- Message ----- From: Arvil Chaco, PA-C Sent: 02/24/2021  11:08 AM EDT To: [facility] Alert [place] Scheduling Subject: TCM                                            Hello,  This patient was admitted and seen at [facility] for CP and was cathed with stent placed.  We are expecting discharge this week.  Can you please call and arrange / schedule for follow-up TCM appointment with Dr. Fletcher Anon or APP within the next two weeks?  Thank you! Signed, Arvil Chaco, PA-C 02/24/2021, 11:07 AM Pager [phone]

## 2021-02-25 ENCOUNTER — Other Ambulatory Visit: Payer: Self-pay

## 2021-02-25 DIAGNOSIS — J441 Chronic obstructive pulmonary disease with (acute) exacerbation: Secondary | ICD-10-CM | POA: Diagnosis not present

## 2021-02-25 DIAGNOSIS — I214 Non-ST elevation (NSTEMI) myocardial infarction: Secondary | ICD-10-CM | POA: Diagnosis not present

## 2021-02-25 DIAGNOSIS — J9601 Acute respiratory failure with hypoxia: Secondary | ICD-10-CM | POA: Diagnosis not present

## 2021-02-25 LAB — GLUCOSE, CAPILLARY
Glucose-Capillary: 76 mg/dL (ref 70–99)
Glucose-Capillary: 107 mg/dL — ABNORMAL HIGH (ref 70–99)
Glucose-Capillary: 111 mg/dL — ABNORMAL HIGH (ref 70–99)

## 2021-02-25 LAB — LDL CHOLESTEROL, DIRECT: Direct LDL: 45.5 mg/dL (ref 0–99)

## 2021-02-25 MED ORDER — ATORVASTATIN CALCIUM 80 MG PO TABS
80.0000 mg | ORAL_TABLET | Freq: Every day | ORAL | Status: DC
  Administered 2021-02-26: 80 mg via ORAL
  Filled 2021-02-25: qty 1

## 2021-02-25 MED ORDER — PREDNISONE 20 MG PO TABS: 20.0000 mg | ORAL_TABLET | Freq: Every day | ORAL | Status: DC

## 2021-02-25 MED ORDER — IPRATROPIUM-ALBUTEROL 0.5-2.5 (3) MG/3ML IN SOLN
3.0000 mL | Freq: Three times a day (TID) | RESPIRATORY_TRACT | Status: DC
  Administered 2021-02-25 – 2021-02-26 (×5): 3 mL via RESPIRATORY_TRACT
  Filled 2021-02-25 (×5): qty 3

## 2021-02-25 MED ORDER — TICAGRELOR 90 MG PO TABS
90.0000 mg | ORAL_TABLET | Freq: Two times a day (BID) | ORAL | 0 refills | Status: DC
  Filled 2021-02-25: qty 60, 30d supply, fill #0

## 2021-02-25 MED ORDER — PREDNISONE 20 MG PO TABS
20.0000 mg | ORAL_TABLET | Freq: Every day | ORAL | Status: AC
  Administered 2021-02-25: 20 mg via ORAL
  Filled 2021-02-25: qty 1

## 2021-02-25 NOTE — Progress Notes (Signed)
PROGRESS NOTE    Terri Burns   HWE:[id number]  DOB: 1950-08-14  PCP: Steele Sizer, MD    DOA: 02/19/2021 LOS: 6    Brief Narrative / Hospital Course to Date:   70 year old female with past medical history of COPD, hypertension, GERD, depression, hyperlipidemia, tobacco use disorder, history of C2 odontoid cervical fracture who presented to the ED with wheezing and shortness of breath.  She was noted to have hypoxia with O2 sat of 50% on room air on EMS arrival.  She was admitted for management of acute COPD exacerbation with acute respiratory failure with hypoxia and treated with steroids, bronchodilators and doxycycline.  She initially required BiPAP and since transitioned to nasal cannula oxygen which has since been weaned off.  During admission, patient developed chest pain and tightness.  Labs notable for rising troponin and nonspecific ST-T wave changes on EKG.  She was started on heparin infusion for acute non-STEMI.  Cardiology was consulted and took patient to the Cath Lab where severe proximal LAD stenosis (99%) and moderate RCA disease (60% stenosis) were noted.  Drug-eluting stent was placed to the proximal LAD.  Assessment & Plan   Active Problems:   COPD with acute exacerbation (HCC)   Non-ST elevation (NSTEMI) myocardial infarction ([place])  Acute Non-STEMI -status post left heart cath and stent to proximal LAD.  No intervention to the moderate RCA lesion. --Management per cardiology --Continue aspirin, Brilinta, metoprolol and statin --Aggressive risk factor reduction --If having persistent chest pain, cardiology would consider repeat cath to address RCA lesion --Ambulate mobilize as tolerated 9/21-22 - no complaints of chest pain  Generalized weakness and dizziness on standing /orthostatic -continue PT and OT.  Up out of bed mobilize as tolerated.  Daily orthostatic vitals. 9/22 - pt still quite dizzy on standing, very high fall risk and unsafe to return home  alone  COPD with acute exacerbation -clinically improved, no wheezing on exam today.  Continue steroid, bronchodilator doxycycline.  Wean off steroids.  Acute respiratory failure with hypoxia -resolved, weaned off oxygen to room air.  Initially required BiPAP.  Anxiety -continue as needed Xanax  History of C2 odontoid fracture -patient wears cervical collar but may remove it, uses brace for support due to neck weakness.  Other chronic comorbidities: Hypertension, depression, hyperlipidemia, GERD-continue home medications.  Patient BMI: Body mass index is 26.24 kg/m.   DVT prophylaxis: enoxaparin (LOVENOX) injection 40 mg Start: 02/23/21 0800   Diet:  Diet Orders (From admission, onward)     Start     Ordered   02/22/21 1713  Diet Heart Room service appropriate? Yes; Fluid consistency: Thin  Diet effective now       Question Answer Comment  Room service appropriate? Yes   Fluid consistency: Thin      02/22/21 1712              Code Status: Full Code   Subjective 02/25/21    Patient seen awake sitting up in bed today.  She reports that her son and his wife have asked she come stay with them after discharge from the hospital.  Pt reports still be very weak, feels unsteady on her feet (and PT confirms this to me).  She is scared of having a fall and serious injury at home.  She otherwise reports feeling okay, no fever/chills, chest pain, SOB or other acute complaints.     Disposition Plan & Communication   Status is: Inpatient  Remains inpatient appropriate because: unsafe d/c.  Pt requires 24 hr supervision and assistance due to very high fall risk.  Anticipate able to d/c pt home with son and his family tomorrow, they are out of town.     Dispo: The patient is from: Home              Anticipated d/c is to: Home              Patient currently IS medically stable for d/c.   Difficult to place patient No   Consults, Procedures, Significant Events   Consultants:   Cardiology  Procedures:  Left heart cath 9/19 -PCI to proximal LAD stenosis  Antimicrobials:  Anti-infectives (From admission, onward)    Start     Dose/Rate Route Frequency Ordered Stop   02/19/21 1445  doxycycline (VIBRA-TABS) tablet 100 mg        100 mg Oral Every 12 hours 02/19/21 1430 02/23/21 2220         Micro    Objective   Vitals:   02/25/21 0500 02/25/21 0855 02/25/21 1005 02/25/21 1134  BP:  123/72  115/70  Pulse:  62  61  Resp:    18  Temp:  97.9 F (36.6 C)  97.9 F (36.6 C)  TempSrc:  Oral    SpO2:  100% 98% 98%  Weight: 63 kg     Height:        Intake/Output Summary (Last 24 hours) at 02/25/2021 1634 Last data filed at 02/25/2021 1420 Gross per 24 hour  Intake 960 ml  Output 3050 ml  Net -2090 ml   Filed Weights   02/20/21 1906 02/23/21 0805 02/25/21 0500  Weight: 64.4 kg 64.4 kg 63 kg    Physical Exam:  General exam: awake, alert, no acute distress, chronically ill-appearing, frail HEENT: wearing cervical brace, hearing grossly normal  Respiratory system: CTAB, no wheezes heard, normal respiratory effort, on room air. Cardiovascular system: normal S1/S2, RRR,  no pedal edema.   Central nervous system: A&O x3. no gross focal neurologic deficits, normal speech Extremities: moves all, no edema, normal tone Psychiatry: normal mildly anxious mood, congruent affect, judgement and insight appear normal  Labs   Data Reviewed: I have personally reviewed following labs and imaging studies  CBC: Recent Labs  Lab 02/19/21 1504 02/20/21 0505 02/21/21 1618 02/22/21 0811 02/23/21 0443  WBC 11.0* 8.3 11.5* 12.6* 9.4  HGB 12.4 12.1 12.6 12.5 12.4  HCT 37.9 36.5 38.0 38.4 37.8  MCV 90.5 89.2 89.4 89.1 89.4  PLT 208 216 256 245 269   Basic Metabolic Panel: Recent Labs  Lab 02/19/21 1220 02/20/21 0505 02/22/21 0811 02/23/21 0443 02/24/21 1135  NA 136 134* 132* 135 133*  K 4.5 4.6 4.6 4.1 4.2  CL 106 104 102 99 101  CO2 25 25 23 28 28    GLUCOSE 116* 144* 116* 79 93  BUN 21 18 28* 32* 26*  CREATININE 0.70 0.65 0.65 0.90 0.67  CALCIUM 8.4* 8.2* 8.7* 8.6* 8.4*   GFR: Estimated Creatinine Clearance: 56.5 mL/min (by C-G formula based on SCr of 0.67 mg/dL). Liver Function Tests: Recent Labs  Lab 02/19/21 1220  AST 59*  ALT 52*  ALKPHOS 55  BILITOT 0.4  PROT 6.8  ALBUMIN 3.7   No results for input(s): LIPASE, AMYLASE in the last 168 hours. No results for input(s): AMMONIA in the last 168 hours. Coagulation Profile: Recent Labs  Lab 02/21/21 1618  INR 1.0   Cardiac Enzymes: No results for input(s): CKTOTAL, CKMB, CKMBINDEX,  TROPONINI in the last 168 hours. BNP (last 3 results) No results for input(s): PROBNP in the last 8760 hours. HbA1C: No results for input(s): HGBA1C in the last 72 hours. CBG: Recent Labs  Lab 02/23/21 2111 02/24/21 0834 02/24/21 1243 02/25/21 0807 02/25/21 1132  GLUCAP 135* 104* 104* 76 107*   Lipid Profile: Recent Labs    02/25/21 0931  LDLDIRECT 45.5   Thyroid Function Tests: No results for input(s): TSH, T4TOTAL, FREET4, T3FREE, THYROIDAB in the last 72 hours. Anemia Panel: No results for input(s): VITAMINB12, FOLATE, FERRITIN, TIBC, IRON, RETICCTPCT in the last 72 hours. Sepsis Labs: No results for input(s): PROCALCITON, LATICACIDVEN in the last 168 hours.  Recent Results (from the past 240 hour(s))  SARS CORONAVIRUS 2 (TAT 6-24 HRS) Nasopharyngeal Nasopharyngeal Swab     Status: None   Collection Time: 02/19/21  7:25 PM   Specimen: Nasopharyngeal Swab  Result Value Ref Range Status   SARS Coronavirus 2 NEGATIVE NEGATIVE Final    Comment: (NOTE) SARS-CoV-2 target nucleic acids are NOT DETECTED.  The SARS-CoV-2 RNA is generally detectable in upper and lower respiratory specimens during the acute phase of infection. Negative results do not preclude SARS-CoV-2 infection, do not rule out co-infections with other pathogens, and should not be used as the sole basis for  treatment or other patient management decisions. Negative results must be combined with clinical observations, patient history, and epidemiological information. The expected result is Negative.  Fact Sheet for Patients: SugarRoll.be  Fact Sheet for Healthcare Providers: https://www.woods-mathews.com/  This test is not yet approved or cleared by the [place] FDA and  has been authorized for detection and/or diagnosis of SARS-CoV-2 by FDA under an Emergency Use Authorization (EUA). This EUA will remain  in effect (meaning this test can be used) for the duration of the COVID-19 declaration under Se ction 564(b)(1) of the Act, 21 U.S.C. section 360bbb-3(b)(1), unless the authorization is terminated or revoked sooner.  Performed at [facility], [place] [address], [place], [place] [address]       Imaging Studies   No results found.   Medications   Scheduled Meds:  aspirin EC  81 mg Oral Daily   [START ON 02/26/2021] atorvastatin  80 mg Oral Daily   citalopram  20 mg Oral QPM   enoxaparin (LOVENOX) injection  40 mg Subcutaneous Q24H   ipratropium-albuterol  3 mL Nebulization TID   isosorbide mononitrate  15 mg Oral Daily   loratadine  10 mg Oral Daily   losartan  100 mg Oral QPM   metoprolol succinate  25 mg Oral QPM   nicotine  7 mg Transdermal Daily   pantoprazole  40 mg Oral QPM   sodium chloride flush  3 mL Intravenous Q12H   ticagrelor  90 mg Oral BID   Continuous Infusions:  sodium chloride         LOS: 6 days    Time spent: 25 minutes      Ezekiel Slocumb, DO [facility]  02/25/2021, 4:34 PM      If 7PM-7AM, please contact night-coverage. How to contact the [facility] Attending or Consulting provider [place] or covering provider during after hours [place], for this patient?    Check the care team in [facility] and look for a) attending/consulting [facility] provider listed and b) the [facility] team listed Log into  [email or web address] and use [place] universal password to access. If you do not have the password, please contact the hospital operator.  Locate the [facility] provider you are looking for under [facility] and page to a number that you can be directly reached. If you still have difficulty reaching the provider, please page the [facility] (Director on Call) for the Hospitalists listed on amion for assistance.

## 2021-02-25 NOTE — Care Management Important Message (Signed)
Important Message  Patient Details  Name: Terri Burns MRN: [id number] Date of Birth: 08-12-1950   Medicare Important Message Given:  Yes     Dannette Barbara 02/25/2021, 2:15 PM

## 2021-02-25 NOTE — Progress Notes (Signed)
Progress Note  Patient Name: Terri Burns Date of Encounter: 02/25/2021  [facility] Cardiologist: Dr. Fletcher Anon  Subjective   Patient is feeling good this morning. No chest pain. Breathing is good. Cath site stable.   Inpatient Medications    Scheduled Meds:  amLODipine  5 mg Oral Daily   aspirin EC  81 mg Oral Daily   [START ON 02/26/2021] atorvastatin  80 mg Oral Daily   citalopram  20 mg Oral QPM   enoxaparin (LOVENOX) injection  40 mg Subcutaneous Q24H   ipratropium-albuterol  3 mL Nebulization TID   isosorbide mononitrate  15 mg Oral Daily   loratadine  10 mg Oral Daily   losartan  100 mg Oral QPM   metoprolol succinate  25 mg Oral QPM   nicotine  7 mg Transdermal Daily   pantoprazole  40 mg Oral QPM   sodium chloride flush  3 mL Intravenous Q12H   ticagrelor  90 mg Oral BID   Continuous Infusions:  sodium chloride     PRN Meds: sodium chloride, acetaminophen **OR** acetaminophen, ALPRAZolam, fluticasone, HYDROcodone-acetaminophen, ipratropium-albuterol, morphine injection, ondansetron **OR** ondansetron (ZOFRAN) IV, sodium chloride flush   Vital Signs    Vitals:   02/25/21 0007 02/25/21 0400 02/25/21 0500 02/25/21 0855  BP: 99/64 120/82  123/72  Pulse: 62 (!) 59  62  Resp: 18     Temp: 98.1 F (36.7 C) 98 F (36.7 C)  97.9 F (36.6 C)  TempSrc:  Oral  Oral  SpO2: 100% 100%  100%  Weight:   63 kg   Height:        Intake/Output Summary (Last 24 hours) at 02/25/2021 0921 Last data filed at 02/25/2021 0600 Gross per 24 hour  Intake 620 ml  Output 2450 ml  Net -1830 ml   Last 3 Weights 02/25/2021 02/23/2021 02/20/2021  Weight (lbs) 138 lb 14.2 oz 141 lb 15.6 oz 142 lb  Weight (kg) 63 kg 64.4 kg 64.411 kg      Telemetry    SR, HR 50-60 - Personally Reviewed  ECG    NO new - Personally Reviewed  Physical Exam   GEN: No acute distress.   Neck: No JVD Cardiac: RRR, no murmurs, rubs, or gallops.  Respiratory: Clear to auscultation  bilaterally. GI: Soft, nontender, non-distended  MS: No edema; No deformity. Neuro:  Nonfocal  Psych: Normal affect   Labs    High Sensitivity Troponin:   Recent Labs  Lab 02/19/21 1220 02/20/21 0505 02/20/21 1014 02/21/21 0958 02/21/21 1151  TROPONINIHS 50* 129* 267* 258* 308*     Chemistry Recent Labs  Lab 02/19/21 1220 02/20/21 0505 02/22/21 0811 02/23/21 0443 02/24/21 1135  NA 136   < > 132* 135 133*  K 4.5   < > 4.6 4.1 4.2  CL 106   < > 102 99 101  CO2 25   < > 23 28 28   GLUCOSE 116*   < > 116* 79 93  BUN 21   < > 28* 32* 26*  CREATININE 0.70   < > 0.65 0.90 0.67  CALCIUM 8.4*   < > 8.7* 8.6* 8.4*  PROT 6.8  --   --   --   --   ALBUMIN 3.7  --   --   --   --   AST 59*  --   --   --   --   ALT 52*  --   --   --   --  ALKPHOS 55  --   --   --   --   BILITOT 0.4  --   --   --   --   GFRNONAA >60   < > >60 >60 >60  ANIONGAP 5   < > 7 8 4*   < > = values in this interval not displayed.    Lipids No results for input(s): CHOL, TRIG, HDL, LABVLDL, LDLCALC, CHOLHDL in the last 168 hours.  Hematology Recent Labs  Lab 02/21/21 1618 02/22/21 0811 02/23/21 0443  WBC 11.5* 12.6* 9.4  RBC 4.25 4.31 4.23  HGB 12.6 12.5 12.4  HCT 38.0 38.4 37.8  MCV 89.4 89.1 89.4  MCH 29.6 29.0 29.3  MCHC 33.2 32.6 32.8  RDW 14.6 14.5 14.4  PLT 256 245 210   Thyroid No results for input(s): TSH, FREET4 in the last 168 hours.  BNP Recent Labs  Lab 02/19/21 1220 02/24/21 1135  BNP 152.9* 76.8    DDimer No results for input(s): DDIMER in the last 168 hours.   Radiology    No results found.  Cardiac Studies     Lhc 02/22/2021   Prox RCA to Mid RCA lesion is 60% stenosed.   Ost LAD to Prox LAD lesion is 25% stenosed.   Ost Cx to Prox Cx lesion is 40% stenosed.   1st Mrg lesion is 30% stenosed.   Prox LAD lesion is 99% stenosed.   A drug-eluting stent was successfully placed using a STENT ONYX FRONTIER 3.0X18.   Post intervention, there is a 0% residual  stenosis.   There is moderate to severe left ventricular systolic dysfunction.   LV end diastolic pressure is severely elevated.   The left ventricular ejection fraction is 25-35% by visual estimate.   Echo 02/22/2021  1. Left ventricular ejection fraction, by estimation, is 55 to 60%. The  left ventricle has normal function. Left ventricular endocardial border  not optimally defined to evaluate regional wall motion. Left ventricular  diastolic parameters are consistent  with Grade I diastolic dysfunction (impaired relaxation).   2. Right ventricular systolic function is normal. The right ventricular  size is normal. Tricuspid regurgitation signal is inadequate for assessing  PA pressure.   3. The mitral valve is normal in structure. No evidence of mitral valve  regurgitation. No evidence of mitral stenosis.   4. The aortic valve is normal in structure. Aortic valve regurgitation is  not visualized. Mild to moderate aortic valve sclerosis/calcification is  present, without any evidence of aortic stenosis.   5. The inferior vena cava is normal in size with greater than 50%  respiratory variability, suggesting right atrial pressure of 3 mmHg.   Lhc 02/22/2021   Prox RCA to Mid RCA lesion is 60% stenosed.   Ost LAD to Prox LAD lesion is 25% stenosed.   Ost Cx to Prox Cx lesion is 40% stenosed.   1st Mrg lesion is 30% stenosed.   Prox LAD lesion is 99% stenosed.   A drug-eluting stent was successfully placed using a STENT ONYX FRONTIER 3.0X18.   Post intervention, there is a 0% residual stenosis.   There is moderate to severe left ventricular systolic dysfunction.   LV end diastolic pressure is severely elevated.   The left ventricular ejection fraction is 25-35% by visual estimate.   Echo 02/22/2021  1. Left ventricular ejection fraction, by estimation, is 55 to 60%. The  left ventricle has normal function. Left ventricular endocardial border  not optimally defined to evaluate  regional  wall motion. Left ventricular  diastolic parameters are consistent  with Grade I diastolic dysfunction (impaired relaxation).   2. Right ventricular systolic function is normal. The right ventricular  size is normal. Tricuspid regurgitation signal is inadequate for assessing  PA pressure.   3. The mitral valve is normal in structure. No evidence of mitral valve  regurgitation. No evidence of mitral stenosis.   4. The aortic valve is normal in structure. Aortic valve regurgitation is  not visualized. Mild to moderate aortic valve sclerosis/calcification is  present, without any evidence of aortic stenosis.   5. The inferior vena cava is normal in size with greater than 50%  respiratory variability, suggesting right atrial pressure of 3 mmHg.   Patient Profile     70 y.o. female with history of COPD, tobacco use, hypertension, hyperlipidemia presenting with chest pain.  Diagnosed with NSTEMI, left heart cath showing significant proximal LAD stenosis, moderate RCA disease.  S/p PCI to proximal LAD.  Assessment & Plan    NSTEMI CAD s/p PCI to pLAD - she is s/p PCI to LAD. cath showed residual mod RCA lesion not treated with PCI - DAPT with Aspirin, Brilinta for at least 12 months -  continue Lipitor and Toprol - Echo showed preserved EF - Labs stable - cath site stable - Patient denies chest pain - She lives alone. PT/OT consulted. Has a walker at home.  HTN - BP controlled - continue Toprol, Losartan  HLD - needs lipid profile check - increase Lipitor to 40mg  dialy  Tobacco use - cessation advised  COPD with acute exacerbation - breathing improved - per IM  For questions or updates, please contact [facility] Please consult [email or web address] for contact info under        Signed, Dailey Alberson Ninfa Meeker, PA-C  02/25/2021, 9:21 AM

## 2021-02-25 NOTE — Plan of Care (Signed)
Patient slept well, denied blurred vision or headache, VS stable, no BM, monitored closely.     Problem: Clinical Measurements: Goal: Will remain free from infection Outcome: Progressing   Problem: Clinical Measurements: Goal: Respiratory complications will improve Outcome: Progressing   Problem: Activity: Goal: Risk for activity intolerance will decrease Outcome: Progressing   Problem: Nutrition: Goal: Adequate nutrition will be maintained Outcome: Progressing   Problem: Elimination: Goal: Will not experience complications related to urinary retention Outcome: Progressing   Problem: Skin Integrity: Goal: Risk for impaired skin integrity will decrease Outcome: Progressing

## 2021-02-25 NOTE — Progress Notes (Signed)
Physical Therapy Treatment Patient Details Name: Terri Burns MRN: [id number] DOB: September 29, 1950 Today's Date: 02/25/2021   History of Present Illness Terri Burns is a 70 y.o. female with a past medical history of COPD not on home oxygen, hypertension, GERD, depression, dyslipidemia, tobacco dependence currently smoking 1/4 packs/day.  This patient presented to the emergency department with shortness of breath/respiratory distress that started 8:00 this morning.  She was noted to be tachypneic and wheezing.  When EMS arrived she was 50% on room air. s/p NSTEMI with cath    PT Comments    Patient received in bed, she is agreeable to PT. States she had a very bad day yesterday, just felt awful. She reports she is feeling better today. Patient performs bed mobility with mod independence. Transfers with min assist. She is very anxious with standing. Ambulated from bed to recliner with RW and min assist. She required a lot of cuing and calming for progressing of mobility. Patient will continue to benefit from skilled PT while here to improve safety and independence.      Recommendations for follow up therapy are one component of a multi-disciplinary discharge planning process, led by the attending physician.  Recommendations may be updated based on patient status, additional functional criteria and insurance authorization.  Follow Up Recommendations  SNF;Supervision/Assistance - 24 hour     Equipment Recommendations  None recommended by PT    Recommendations for Other Services       Precautions / Restrictions Precautions Precautions: Fall Precaution Comments: cervial collar X 2 years, cleared to remove by neuro 9/2 Restrictions Weight Bearing Restrictions: No     Mobility  Bed Mobility Overal bed mobility: Modified Independent             General bed mobility comments: no physical assist with bed mobility    Transfers Overall transfer level: Needs assistance Equipment used:  Rolling walker (2 wheeled) Transfers: Sit to/from Stand Sit to Stand: Min assist         General transfer comment: Patient moaning, anxious, fearful with sit to stand transfer. Requires calming techniques to progress  Ambulation/Gait Ambulation/Gait assistance: Min assist Gait Distance (Feet): 5 Feet Assistive device: Rolling walker (2 wheeled) Gait Pattern/deviations: Step-through pattern;Decreased step length - right;Decreased step length - left Gait velocity: decr   General Gait Details: patient ambulated to recliner. Anxious during mobility. Requires verbal cues and calming throughout to improve confidence   Stairs             Wheelchair Mobility    Modified Rankin (Stroke Patients Only)       Balance Overall balance assessment: Needs assistance Sitting-balance support: Feet supported Sitting balance-Leahy Scale: Good     Standing balance support: Bilateral upper extremity supported;During functional activity Standing balance-Leahy Scale: Fair Standing balance comment: Patient anxious, feeling weak, reliant on B UE support and min assist at this time for standing                            Cognition Arousal/Alertness: Awake/alert Behavior During Therapy: Anxious Overall Cognitive Status: Within Functional Limits for tasks assessed                                 General Comments: A&O x 4      Exercises      General Comments        Pertinent Vitals/Pain Pain  Assessment: Faces Faces Pain Scale: Hurts little more Pain Location: head Pain Descriptors / Indicators: Headache Pain Intervention(s): Monitored during session;Limited activity within patient's tolerance    Home Living                      Prior Function            PT Goals (current goals can now be found in the care plan section) Acute Rehab PT Goals Patient Stated Goal: to go home PT Goal Formulation: With patient Time For Goal Achievement:  03/06/21 Potential to Achieve Goals: Fair Progress towards PT goals: Progressing toward goals    Frequency    Min 2X/week      PT Plan Discharge plan needs to be updated    Co-evaluation              AM-PAC PT "6 Clicks" Mobility   Outcome Measure  Help needed turning from your back to your side while in a flat bed without using bedrails?: None Help needed moving from lying on your back to sitting on the side of a flat bed without using bedrails?: None Help needed moving to and from a bed to a chair (including a wheelchair)?: A Little Help needed standing up from a chair using your arms (e.g., wheelchair or bedside chair)?: A Little Help needed to walk in hospital room?: A Lot Help needed climbing 3-5 steps with a railing? : Total 6 Click Score: 17    End of Session Equipment Utilized During Treatment: Gait belt Activity Tolerance: Patient limited by fatigue;Other (comment) (anxiety) Patient left: in chair;with chair alarm set;with call bell/phone within reach Nurse Communication: Mobility status PT Visit Diagnosis: Unsteadiness on feet (R26.81);Other abnormalities of gait and mobility (R26.89);Difficulty in walking, not elsewhere classified (R26.2);Dizziness and giddiness (R42)     Time: 2703-5009 PT Time Calculation (min) (ACUTE ONLY): 16 min  Charges:  $Gait Training: 8-22 mins                     [employer], PT, GCS 02/25/21,10:12 AM

## 2021-02-25 NOTE — Progress Notes (Signed)
Mobility Specialist - Progress Note   02/25/21 1400  Mobility  Activity Refused mobility  Mobility performed by Mobility specialist    Second attempt this date. Pt receiving breathing tx on arrival. Will attempt session another date.    Kathee Delton Mobility Specialist 02/25/21, 2:43 PM

## 2021-02-25 NOTE — Discharge Summary (Addendum)
Physician Discharge Summary  RAVEEN WIESELER HQI:[id number] DOB: 11-07-1950 DOA: 02/19/2021  PCP: Steele Sizer, MD  Admit date: 02/19/2021 Discharge date: 03/24/2021  Admitted From: home Disposition:  home  Recommendations for Outpatient Follow-up:  Follow up with PCP in 1-2 weeks Please obtain BMP/CBC in one week Please follow up with cardiology  Home Health: PT, RN  Equipment/Devices:    Discharge Condition: Stable  CODE STATUS: Full Diet recommendation: Heart Healthy     Discharge Diagnoses: Active Problems:   COPD with acute exacerbation (HCC)   Non-ST elevation (NSTEMI) myocardial infarction [facility])    Summary of HPI and Hospital Course:  70 year old female with past medical history of COPD, hypertension, GERD, depression, hyperlipidemia, tobacco use disorder, history of C2 odontoid cervical fracture who presented to the ED with wheezing and shortness of breath.  She was noted to have hypoxia with O2 sat of 50% on room air on EMS arrival.  She was admitted for management of acute COPD exacerbation with acute respiratory failure with hypoxia and treated with steroids, bronchodilators and doxycycline.  She initially required BiPAP and since transitioned to nasal cannula oxygen which has since been weaned off.   During admission, patient developed chest pain and tightness.  Labs notable for rising troponin and nonspecific ST-T wave changes on EKG.  She was started on heparin infusion for acute non-STEMI.  Cardiology was consulted and took patient to the Cath Lab where severe proximal LAD stenosis (99%) and moderate RCA disease (60% stenosis) were noted.  Drug-eluting stent was placed to the proximal LAD.   Acute Non-STEMI -status post left heart cath and stent to proximal LAD.   No intervention to the moderate RCA lesion. Cardiology consulted for management.  See their recommendations. --Follow up cardiology outpatient in 1-2 weeks --Continue aspirin, Brilinta, metoprolol and  statin --Aggressive risk factor reduction --Ambulate mobilize as tolerated  Patient is chest pain free and stable for discharge home with [facility] services and outpatient follow up.   Generalized weakness and dizziness on standing /orthostatic -continue PT and OT.   Up out of bed mobilize as tolerated.   Home health PT arranged.   COPD with acute exacerbation -clinically improved, no wheezing on exam today.   Treated with steroids, bronchodilators, doxycycline.   Completed course steroids. No wheezing or signs of exacerbation at time of discharge.  Acute on Chronic diastolic CHF - improved with diuresis.  Euvolemic upon discharge.   Acute respiratory failure with hypoxia -resolved, weaned off oxygen to room air.   Initially required BiPAP.   Anxiety -continue as needed Xanax   History of C2 odontoid fracture -patient wears cervical collar but may remove it, uses brace for support due to neck weakness.  Other chronic comorbidities: Hypertension, depression, hyperlipidemia, GERD-continue home medications.   Discharge Instructions   Discharge Instructions     (HEART FAILURE PATIENTS) Call MD:  Anytime you have any of the following symptoms: 1) 3 pound weight gain in 24 hours or 5 pounds in 1 week 2) shortness of breath, with or without a dry hacking cough 3) swelling in the hands, feet or stomach 4) if you have to sleep on extra pillows at night in order to breathe.   Complete by: As directed    AMB Referral to Cardiac Rehabilitation - Phase II   Complete by: As directed    Diagnosis:  NSTEMI Coronary Stents     After initial evaluation and assessments completed: Virtual Based Care may be provided alone or in conjunction with  Phase 2 Cardiac Rehab based on patient barriers.: Yes   Call MD for:  extreme fatigue   Complete by: As directed    Call MD for:  persistant dizziness or light-headedness   Complete by: As directed    Call MD for:  persistant nausea and vomiting   Complete by:  As directed    Call MD for:  severe uncontrolled pain   Complete by: As directed    Call MD for:  temperature >100.4   Complete by: As directed    Diet - low sodium heart healthy   Complete by: As directed    Discharge instructions   Complete by: As directed    Please be sure to take all medications as prescribed.   You are on new medications for your heart.  If you feel like you are having any bothersome side effects, please contact Cardiology's office or your Primary Care Provider.     For Anxiety - please discuss with your Primary Care Provider.  We started a very low dose of Xanax (alprazolam) to be used AS NEEDED if you're having increased anxiety or panic.  Please limit use of this medication as much as possible - it can make you sleepy, or cause other side effects.  This type of medicine is not something we like to use long term, so your PCP will probably talk to you about trying other medications if needed.   Increase activity slowly   Complete by: As directed       Allergies as of 02/26/2021       Reactions   Levofloxacin Shortness Of Breath   Penicillins Hives   itching   Meloxicam    dizziness   Nsaids Other (See Comments)   Patient states she can tolerate up to three doses per day without incident        Medication List     STOP taking these medications    amLODipine 5 MG tablet Commonly known as: NORVASC       TAKE these medications    albuterol 108 (90 Base) MCG/ACT inhaler Commonly known as: VENTOLIN HFA Inhale 1-2 puffs into the lungs every 6 (six) hours as needed for wheezing or shortness of breath.   ALPRAZolam 0.25 MG tablet Commonly known as: XANAX Take 1 tablet (0.25 mg total) by mouth 2 (two) times daily as needed for anxiety.   aspirin 81 MG EC tablet TAKE 1 TABLET BY MOUTH EVERY DAY   atorvastatin 80 MG tablet Commonly known as: LIPITOR Take 1 tablet (80 mg total) by mouth daily. What changed:  medication strength See the new  instructions.   blood glucose meter kit and supplies Kit Dispense based on patient and insurance preference. Use up to four times daily as directed.   Brilinta 90 MG Tabs tablet Generic drug: ticagrelor Take 1 tablet (90 mg total) by mouth 2 (two) times daily for 60 doses.   fluticasone 50 MCG/ACT nasal spray Commonly known as: FLONASE Place 2 sprays into both nostrils daily.   ipratropium-albuterol 0.5-2.5 (3) MG/3ML Soln Commonly known as: DUONEB Inhale 3 mLs into the lungs every 4 (four) hours as needed.   isosorbide mononitrate 30 MG 24 hr tablet Commonly known as: IMDUR Take 0.5 tablets (15 mg total) by mouth daily.        Allergies  Allergen Reactions   Levofloxacin Shortness Of Breath   Penicillins Hives    itching   Meloxicam     dizziness   Nsaids Other (  See Comments)    Patient states she can tolerate up to three doses per day without incident     If you experience worsening of your admission symptoms, develop shortness of breath, life threatening emergency, suicidal or homicidal thoughts you must seek medical attention immediately by calling 911 or calling your MD immediately  if symptoms less severe.    Please note   You were cared for by a hospitalist during your hospital stay. If you have any questions about your discharge medications or the care you received while you were in the hospital after you are discharged, you can call the unit and asked to speak with the hospitalist on call if the hospitalist that took care of you is not available. Once you are discharged, your primary care physician will handle any further medical issues. Please note that NO REFILLS for any discharge medications will be authorized once you are discharged, as it is imperative that you return to your primary care physician (or establish a relationship with a primary care physician if you do not have one) for your aftercare needs so that they can reassess your need for medications and  monitor your lab values.   Consultations: Cardiology    Procedures/Studies: VAS [place] LOWER EXTREMITY VENOUS (DVT)  Result Date: 03/12/2021  Lower Venous DVT Study Patient Name:  ROSARY FILOSA  Date of Exam:   03/11/2021 Medical Rec #: [id number]       Accession #:    [id number] Date of Birth: [birth year]       Patient Gender: F Patient Age:   38 years Exam Location:  [place] Procedure:      VAS [place] LOWER EXTREMITY VENOUS (DVT) Referring Phys: Marrianne Mood --------------------------------------------------------------------------------  Indications: Pain, and Swelling.  Performing Technologist: Caesar Chestnut RDCS, RVT  Examination Guidelines: A complete evaluation includes B-mode imaging, spectral Doppler, color Doppler, and power Doppler as needed of all accessible portions of each vessel. Bilateral testing is considered an integral part of a complete examination. Limited examinations for reoccurring indications may be performed as noted. The reflux portion of the exam is performed with the patient in reverse Trendelenburg.  +---------+---------------+---------+-----------+----------+--------------+ LEFT     CompressibilityPhasicitySpontaneityPropertiesThrombus Aging +---------+---------------+---------+-----------+----------+--------------+ CFV      Full           Yes      Yes                                 +---------+---------------+---------+-----------+----------+--------------+ SFJ      Full           Yes      Yes                                 +---------+---------------+---------+-----------+----------+--------------+ FV Prox  Full           Yes      Yes                                 +---------+---------------+---------+-----------+----------+--------------+ FV Mid   Full           Yes      Yes                                 +---------+---------------+---------+-----------+----------+--------------+  FV DistalFull           Yes      Yes                                  +---------+---------------+---------+-----------+----------+--------------+ PFV      Full           Yes      Yes                                 +---------+---------------+---------+-----------+----------+--------------+ POP      Full           Yes      Yes                                 +---------+---------------+---------+-----------+----------+--------------+ PTV      Full           Yes      Yes                                 +---------+---------------+---------+-----------+----------+--------------+ PERO     Full           Yes      Yes                                 +---------+---------------+---------+-----------+----------+--------------+ Gastroc  Full                                                        +---------+---------------+---------+-----------+----------+--------------+ GSV      Full                                                        +---------+---------------+---------+-----------+----------+--------------+     Summary: RIGHT: - No evidence of common femoral vein obstruction.  LEFT: - No evidence of deep vein thrombosis in the lower extremity. No indirect evidence of obstruction proximal to the inguinal ligament.  *See table(s) above for measurements and observations. Electronically signed by Carlyle Dolly MD on 03/12/2021 at 4:19:05 PM.    Final      Left Heart Cath on 02/22/21 - see report    Subjective: Pt feels okay today, just tired and weak. Denies chest pain or other acute complaints.  Hesitant about going home but agreeable.    Discharge Exam: Vitals:   02/26/21 1322 02/26/21 1446  BP:  110/84  Pulse:  67  Resp:  17  Temp:  98.5 F (36.9 C)  SpO2: 99% 98%   Vitals:   02/26/21 0900 02/26/21 1110 02/26/21 1322 02/26/21 1446  BP: 128/74 103/68  110/84  Pulse: (!) 58 (!) 58  67  Resp:  17  17  Temp: (!) 97.5 F (36.4 C) 98.9 F (37.2 C)  98.5 F (36.9 C)  TempSrc:  Oral  Oral  SpO2: 100% 100% 99% 98%   Weight:  Height:        General: Pt is alert, awake, not in acute distress, chronically ill-appearing HENT: wearing cervical collar, unable to assess JVD Cardiovascular: RRR, S1/S2 +, no rubs, no gallops Respiratory: CTA bilaterally but diminished, no wheezing, no rhonchi Abdominal: Soft, NT, ND, bowel sounds + Extremities: no edema, no cyanosis    The results of significant diagnostics from this hospitalization (including imaging, microbiology, ancillary and laboratory) are listed below for reference.     Microbiology: No results found for this or any previous visit (from the past 240 hour(s)).   Labs: BNP (last 3 results) Recent Labs    02/02/21 0125 02/19/21 1220 02/24/21 1135  BNP 283.2* 152.9* 86.7   Basic Metabolic Panel: No results for input(s): NA, K, CL, CO2, GLUCOSE, BUN, CREATININE, CALCIUM, MG, PHOS in the last 168 hours. Liver Function Tests: No results for input(s): AST, ALT, ALKPHOS, BILITOT, PROT, ALBUMIN in the last 168 hours. No results for input(s): LIPASE, AMYLASE in the last 168 hours. No results for input(s): AMMONIA in the last 168 hours. CBC: No results for input(s): WBC, NEUTROABS, HGB, HCT, MCV, PLT in the last 168 hours. Cardiac Enzymes: No results for input(s): CKTOTAL, CKMB, CKMBINDEX, TROPONINI in the last 168 hours. BNP: Invalid input(s): POCBNP CBG: No results for input(s): GLUCAP in the last 168 hours. D-Dimer No results for input(s): DDIMER in the last 72 hours. Hgb A1c No results for input(s): HGBA1C in the last 72 hours. Lipid Profile No results for input(s): CHOL, HDL, LDLCALC, TRIG, CHOLHDL, LDLDIRECT in the last 72 hours. Thyroid function studies No results for input(s): TSH, T4TOTAL, T3FREE, THYROIDAB in the last 72 hours.  Invalid input(s): FREET3 Anemia work up No results for input(s): VITAMINB12, FOLATE, FERRITIN, TIBC, IRON, RETICCTPCT in the last 72 hours. Urinalysis    Component Value Date/Time   COLORURINE  STRAW (A) 02/02/2021 1507   APPEARANCEUR CLEAR (A) 02/02/2021 1507   LABSPEC 1.010 02/02/2021 1507   PHURINE 6.0 02/02/2021 1507   GLUCOSEU NEGATIVE 02/02/2021 1507   HGBUR SMALL (A) 02/02/2021 1507   BILIRUBINUR NEGATIVE 02/02/2021 1507   KETONESUR NEGATIVE 02/02/2021 1507   PROTEINUR NEGATIVE 02/02/2021 1507   NITRITE NEGATIVE 02/02/2021 1507   LEUKOCYTESUR NEGATIVE 02/02/2021 1507   Sepsis Labs Invalid input(s): PROCALCITONIN,  WBC,  LACTICIDVEN Microbiology No results found for this or any previous visit (from the past 240 hour(s)).   Time coordinating discharge: Over 30 minutes  SIGNED:   Ezekiel Slocumb, DO [facility] 03/24/2021, 4:49 PM   If 7PM-7AM, please contact night-coverage [email or web address]

## 2021-02-26 LAB — GLUCOSE, CAPILLARY
Glucose-Capillary: 65 mg/dL — ABNORMAL LOW (ref 70–99)
Glucose-Capillary: 80 mg/dL (ref 70–99)
Glucose-Capillary: 82 mg/dL (ref 70–99)
Glucose-Capillary: 100 mg/dL — ABNORMAL HIGH (ref 70–99)

## 2021-02-26 MED ORDER — BLOOD GLUCOSE MONITOR KIT: PACK | 0 refills | Status: DC

## 2021-02-26 MED ORDER — ATORVASTATIN CALCIUM 80 MG PO TABS
80.0000 mg | ORAL_TABLET | Freq: Every day | ORAL | 2 refills | Status: DC
Start: 2021-02-27 — End: 2021-06-09

## 2021-02-26 MED ORDER — ISOSORBIDE MONONITRATE ER 30 MG PO TB24: 15.0000 mg | ORAL_TABLET | Freq: Every day | ORAL | 2 refills | Status: DC

## 2021-02-26 MED ORDER — ALPRAZOLAM 0.25 MG PO TABS: 0.2500 mg | ORAL_TABLET | Freq: Two times a day (BID) | ORAL | 0 refills | Status: DC | PRN

## 2021-02-26 NOTE — Progress Notes (Signed)
Discharge instructions explained to pt/verbalized understanding. IV and tele removed. Will transport off unit via wheelchair when ride arrives.  

## 2021-02-26 NOTE — TOC Transition Note (Signed)
Transition of Care [facility]) - CM/SW Discharge Note   Patient Details  Name: Terri Burns MRN: [id number] Date of Birth: [birth year]  Transition of Care [facility]) CM/SW Contact:  Alberteen Sam, LCSW Phone Number: 02/26/2021, 9:34 AM   Clinical Narrative:     Patient to discharge home with home health services of PT and RN through [facility]. Jason with [facility] notified of dc today.   No other dc needs identified at this time.    Final next level of care: [place] Barriers to Discharge: No Barriers Identified   Patient Goals and CMS Choice Patient states their goals for this hospitalization and ongoing recovery are:: to go home CMS Medicare.gov Compare Post Acute Care list provided to:: Patient Choice offered to / list presented to : Patient  Discharge Placement                    Patient and family notified of of transfer: 02/26/21  Discharge Plan and Services                          HH Arranged: PT, RN [facility] Agency: [place] ([facility]) Date [place]: 02/26/21 Time [place]: [phone] Representative spoke with at [place]: [place] ([place]) Interventions     Readmission Risk Interventions No flowsheet data found.

## 2021-02-27 DIAGNOSIS — G8929 Other chronic pain: Secondary | ICD-10-CM | POA: Diagnosis not present

## 2021-02-27 DIAGNOSIS — K219 Gastro-esophageal reflux disease without esophagitis: Secondary | ICD-10-CM | POA: Diagnosis not present

## 2021-02-27 DIAGNOSIS — Z9181 History of falling: Secondary | ICD-10-CM | POA: Diagnosis not present

## 2021-02-27 DIAGNOSIS — J441 Chronic obstructive pulmonary disease with (acute) exacerbation: Secondary | ICD-10-CM | POA: Diagnosis not present

## 2021-02-27 DIAGNOSIS — I10 Essential (primary) hypertension: Secondary | ICD-10-CM | POA: Diagnosis not present

## 2021-02-27 DIAGNOSIS — F329 Major depressive disorder, single episode, unspecified: Secondary | ICD-10-CM | POA: Diagnosis not present

## 2021-02-27 DIAGNOSIS — I214 Non-ST elevation (NSTEMI) myocardial infarction: Secondary | ICD-10-CM | POA: Diagnosis not present

## 2021-02-27 DIAGNOSIS — G629 Polyneuropathy, unspecified: Secondary | ICD-10-CM | POA: Diagnosis not present

## 2021-02-27 DIAGNOSIS — I2510 Atherosclerotic heart disease of native coronary artery without angina pectoris: Secondary | ICD-10-CM | POA: Diagnosis not present

## 2021-02-27 DIAGNOSIS — S12120K Other displaced dens fracture, subsequent encounter for fracture with nonunion: Secondary | ICD-10-CM | POA: Diagnosis not present

## 2021-02-27 DIAGNOSIS — E785 Hyperlipidemia, unspecified: Secondary | ICD-10-CM | POA: Diagnosis not present

## 2021-02-27 DIAGNOSIS — F17210 Nicotine dependence, cigarettes, uncomplicated: Secondary | ICD-10-CM | POA: Diagnosis not present

## 2021-02-27 DIAGNOSIS — I429 Cardiomyopathy, unspecified: Secondary | ICD-10-CM | POA: Diagnosis not present

## 2021-02-27 DIAGNOSIS — Z7951 Long term (current) use of inhaled steroids: Secondary | ICD-10-CM | POA: Diagnosis not present

## 2021-02-27 DIAGNOSIS — Z7982 Long term (current) use of aspirin: Secondary | ICD-10-CM | POA: Diagnosis not present

## 2021-03-01 ENCOUNTER — Telehealth: Payer: Self-pay

## 2021-03-01 DIAGNOSIS — I214 Non-ST elevation (NSTEMI) myocardial infarction: Secondary | ICD-10-CM | POA: Diagnosis not present

## 2021-03-01 DIAGNOSIS — I429 Cardiomyopathy, unspecified: Secondary | ICD-10-CM | POA: Diagnosis not present

## 2021-03-01 DIAGNOSIS — I10 Essential (primary) hypertension: Secondary | ICD-10-CM | POA: Diagnosis not present

## 2021-03-01 DIAGNOSIS — I2510 Atherosclerotic heart disease of native coronary artery without angina pectoris: Secondary | ICD-10-CM | POA: Diagnosis not present

## 2021-03-01 DIAGNOSIS — J441 Chronic obstructive pulmonary disease with (acute) exacerbation: Secondary | ICD-10-CM | POA: Diagnosis not present

## 2021-03-01 DIAGNOSIS — G8929 Other chronic pain: Secondary | ICD-10-CM | POA: Diagnosis not present

## 2021-03-01 NOTE — Telephone Encounter (Signed)
Transition Care Management Follow-up Telephone Call Date of discharge and from where: 02/26/21 How have you been since you were released from the hospital? Pt states she is doing okay; feels weak and tired Any questions or concerns? Yes - patient states she was being prescribed a new glucometer but the rx was not at [facility] on [address]. Rx shows faxed from hospital but does not indicate which pharmacy it was sent to. Please send new rx for patient.   Items Reviewed: Did the pt receive and understand the discharge instructions provided? Yes  Medications obtained and verified? Yes  Other? No  Any new allergies since your discharge? No  Dietary orders reviewed? Yes Do you have support at home? Yes   Home Care and Equipment/Supplies: Were home health services ordered? yes If so, what is the name of the agency? [facility]  Has the agency set up a time to come to the patient's home? yes Were any new equipment or medical supplies ordered?  No  Functional Questionnaire: (I = Independent and D = Dependent) ADLs: I  Bathing/Dressing- I  Meal Prep- I  Eating- I  Maintaining continence- I  Transferring/Ambulation- I  Managing Meds- I  Follow up appointments reviewed:  PCP Hospital f/u appt confirmed? Yes  Scheduled to see Dr. Ky Barban on 03/08/21 @ 9:00. [facility] f/u appt confirmed? Yes  Scheduled to see cardiology on 03/11/21. Are transportation arrangements needed? No  If their condition worsens, is the pt aware to call PCP or go to the Emergency Dept.? Yes Was the patient provided with contact information for the PCP's office or ED? Yes Was to pt encouraged to call back with questions or concerns? Yes

## 2021-03-01 NOTE — Telephone Encounter (Signed)
Patient contacted regarding discharge from [facility] on 02/26/21.  Patient understands to follow up with provider Marrianne Mood, PA on Thursday 03/11/21 at 2:30 pm at the [place] office. Patient understands discharge instructions? Yes Patient understands medications and regiment? Yes Patient understands to bring all medications to this visit? Yes

## 2021-03-02 ENCOUNTER — Telehealth: Payer: Self-pay

## 2021-03-02 NOTE — Telephone Encounter (Signed)
Appt updated.

## 2021-03-02 NOTE — Telephone Encounter (Signed)
Copied from [place] [phone]. Topic: Quick Communication - Home Health Verbal Orders >> Mar 02, 2021  1:29 PM P[place], Brayton Layman A wrote: Caller/Agency: [facility]  Callback Number: [phone] Requesting: PT Frequency: 2x8

## 2021-03-02 NOTE — Telephone Encounter (Signed)
Copied from [place] [phone]. Topic: Quick Communication - Home Health Verbal Orders >> Mar 02, 2021  2:25 PM [place] wrote: Caller/Agency: [facility] Callback Number: [phone] Requesting Skilled Nursing Frequency: 1 W 9 and 2 PRN

## 2021-03-03 DIAGNOSIS — I214 Non-ST elevation (NSTEMI) myocardial infarction: Secondary | ICD-10-CM | POA: Diagnosis not present

## 2021-03-03 DIAGNOSIS — I429 Cardiomyopathy, unspecified: Secondary | ICD-10-CM | POA: Diagnosis not present

## 2021-03-03 DIAGNOSIS — I10 Essential (primary) hypertension: Secondary | ICD-10-CM | POA: Diagnosis not present

## 2021-03-03 DIAGNOSIS — J441 Chronic obstructive pulmonary disease with (acute) exacerbation: Secondary | ICD-10-CM | POA: Diagnosis not present

## 2021-03-03 DIAGNOSIS — G8929 Other chronic pain: Secondary | ICD-10-CM | POA: Diagnosis not present

## 2021-03-03 DIAGNOSIS — I2510 Atherosclerotic heart disease of native coronary artery without angina pectoris: Secondary | ICD-10-CM | POA: Diagnosis not present

## 2021-03-03 NOTE — Telephone Encounter (Signed)
Completed.

## 2021-03-05 DIAGNOSIS — I2510 Atherosclerotic heart disease of native coronary artery without angina pectoris: Secondary | ICD-10-CM | POA: Diagnosis not present

## 2021-03-05 DIAGNOSIS — I429 Cardiomyopathy, unspecified: Secondary | ICD-10-CM | POA: Diagnosis not present

## 2021-03-05 DIAGNOSIS — G8929 Other chronic pain: Secondary | ICD-10-CM | POA: Diagnosis not present

## 2021-03-05 DIAGNOSIS — I214 Non-ST elevation (NSTEMI) myocardial infarction: Secondary | ICD-10-CM | POA: Diagnosis not present

## 2021-03-05 DIAGNOSIS — J441 Chronic obstructive pulmonary disease with (acute) exacerbation: Secondary | ICD-10-CM | POA: Diagnosis not present

## 2021-03-05 DIAGNOSIS — I10 Essential (primary) hypertension: Secondary | ICD-10-CM | POA: Diagnosis not present

## 2021-03-08 ENCOUNTER — Ambulatory Visit (INDEPENDENT_AMBULATORY_CARE_PROVIDER_SITE_OTHER): Payer: Medicare Other | Admitting: Family Medicine

## 2021-03-08 ENCOUNTER — Other Ambulatory Visit: Payer: Self-pay

## 2021-03-08 ENCOUNTER — Encounter: Payer: Self-pay | Admitting: Family Medicine

## 2021-03-08 VITALS — BP 126/74 | HR 72 | Temp 97.6°F | Resp 16 | Ht 60.0 in | Wt 136.3 lb

## 2021-03-08 DIAGNOSIS — J9601 Acute respiratory failure with hypoxia: Secondary | ICD-10-CM

## 2021-03-08 DIAGNOSIS — Z720 Tobacco use: Secondary | ICD-10-CM

## 2021-03-08 DIAGNOSIS — F330 Major depressive disorder, recurrent, mild: Secondary | ICD-10-CM

## 2021-03-08 DIAGNOSIS — Z23 Encounter for immunization: Secondary | ICD-10-CM | POA: Diagnosis not present

## 2021-03-08 DIAGNOSIS — J441 Chronic obstructive pulmonary disease with (acute) exacerbation: Secondary | ICD-10-CM | POA: Diagnosis not present

## 2021-03-08 DIAGNOSIS — F3342 Major depressive disorder, recurrent, in full remission: Secondary | ICD-10-CM | POA: Diagnosis not present

## 2021-03-08 DIAGNOSIS — R531 Weakness: Secondary | ICD-10-CM | POA: Diagnosis not present

## 2021-03-08 DIAGNOSIS — Z1231 Encounter for screening mammogram for malignant neoplasm of breast: Secondary | ICD-10-CM | POA: Diagnosis not present

## 2021-03-08 DIAGNOSIS — G8929 Other chronic pain: Secondary | ICD-10-CM | POA: Diagnosis not present

## 2021-03-08 DIAGNOSIS — I10 Essential (primary) hypertension: Secondary | ICD-10-CM | POA: Diagnosis not present

## 2021-03-08 DIAGNOSIS — J432 Centrilobular emphysema: Secondary | ICD-10-CM

## 2021-03-08 DIAGNOSIS — Z1211 Encounter for screening for malignant neoplasm of colon: Secondary | ICD-10-CM

## 2021-03-08 DIAGNOSIS — I214 Non-ST elevation (NSTEMI) myocardial infarction: Secondary | ICD-10-CM

## 2021-03-08 DIAGNOSIS — E2839 Other primary ovarian failure: Secondary | ICD-10-CM

## 2021-03-08 DIAGNOSIS — I2510 Atherosclerotic heart disease of native coronary artery without angina pectoris: Secondary | ICD-10-CM | POA: Diagnosis not present

## 2021-03-08 DIAGNOSIS — I429 Cardiomyopathy, unspecified: Secondary | ICD-10-CM | POA: Diagnosis not present

## 2021-03-08 MED ORDER — CITALOPRAM HYDROBROMIDE 20 MG PO TABS: 30.0000 mg | ORAL_TABLET | Freq: Every evening | ORAL | 0 refills | Status: DC

## 2021-03-08 NOTE — Patient Instructions (Signed)
It was great to see you!  Our plans for today:  - Take 1.5 tablets of your citalopram.  - We ordered a shower stool for you.  - Continue to follow with Cardiology and physical therapy. - Come back in 1 month for follow up for your anxiety.  Take care and seek immediate care sooner if you develop any concerns.   Dr. Ky Barban

## 2021-03-08 NOTE — Progress Notes (Signed)
    SUBJECTIVE:   CHIEF COMPLAINT / HPI:   HOSPITAL FOLLOW UP Hospital/facility: [facility] 9/16-9/23/22 Diagnosis: NSTEMI, COPD w/ acute exacerbation with hypoxia Procedures/tests: 9/19 LHC with DES placement to LAD (99% LAD stenosis, moderate RCA 60% stenosis), initially required BiPAP, weaned. Consultants: Cardiology New medications:  - brilinta, imdur, xanax 0.25mg  prn Discharge instructions:   - PT/OT Status: better - no chest pain since discharge although having some chest tightness now, unsure if it is anxiety - has had increased anxiety since hospitalization. Has taken one xanax since discharge.  - stopped smoking just before hospitalization, using patches.  GAD 7 : Generalized Anxiety Score 03/08/2021 01/01/2018 11/22/2017  Nervous, Anxious, on Edge 1 0 0  Control/stop worrying 1 0 1  Worry too much - different things 0 0 0  Trouble relaxing 0 0 2  Restless 0 0 0  Easily annoyed or irritable 1 0 1  Afraid - awful might happen 0 0 0  Total GAD 7 Score 3 0 4  Anxiety Difficulty Somewhat difficult Not difficult at all -   Depression screen [facility] 2/9 03/08/2021 08/12/2020 07/09/2020  Decreased Interest 1 1 0  Down, Depressed, Hopeless 0 0 0  PHQ - 2 Score 1 1 0  Altered sleeping 1 0 -  Tired, decreased energy 1 1 -  Change in appetite 0 0 -  Feeling bad or failure about yourself  0 0 -  Trouble concentrating 1 0 -  Moving slowly or fidgety/restless 0 0 -  Suicidal thoughts 0 0 -  PHQ-9 Score 4 2 -  Difficult doing work/chores Not difficult at all - -  Some recent data might be hidden      OBJECTIVE:   BP 126/74   Pulse 72   Temp 97.6 F (36.4 C)   Resp 16   Ht 5' (1.524 m)   Wt 136 lb 4.8 oz (61.8 kg)   SpO2 99%   BMI 26.62 kg/m   Gen: chronically ill appearing, in NAD Card: RRR, no murmur/rubs/gallops Lungs: CTAB Ext: WWP, no edema   ASSESSMENT/PLAN:   Recurrent major depressive disorder, in full remission ([place]) With anxiety, subjectively worsened since  hospitalization though scoring reasonable today. Will trial increase citalopram to 30mg . F/u in 1 month.  COPD with acute exacerbation (HCC) Improved s/p steroids, abx. Continue to avoid smoking.  Non-ST elevation (NSTEMI) myocardial infarction [facility]) S/p DES. EKG without new findings today. Continue to follow with Cardiology, has upcoming appt this week. Planning for cardiac rehab.   Tobacco abuse Recommend continued cessation, currently using patches.     Myles Gip, DO

## 2021-03-08 NOTE — Assessment & Plan Note (Addendum)
Improved s/p steroids, abx. Continue to avoid smoking.

## 2021-03-08 NOTE — Assessment & Plan Note (Addendum)
With anxiety, subjectively worsened since hospitalization though scoring reasonable today. Will trial increase citalopram to 30mg . F/u in 1 month.

## 2021-03-08 NOTE — Assessment & Plan Note (Signed)
Recommend continued cessation, currently using patches.

## 2021-03-08 NOTE — Assessment & Plan Note (Signed)
S/p DES. EKG without new findings today. Continue to follow with Cardiology, has upcoming appt this week. Planning for cardiac rehab.

## 2021-03-09 ENCOUNTER — Telehealth: Payer: Self-pay

## 2021-03-09 DIAGNOSIS — I10 Essential (primary) hypertension: Secondary | ICD-10-CM

## 2021-03-09 DIAGNOSIS — J3089 Other allergic rhinitis: Secondary | ICD-10-CM

## 2021-03-09 DIAGNOSIS — K219 Gastro-esophageal reflux disease without esophagitis: Secondary | ICD-10-CM

## 2021-03-09 NOTE — Progress Notes (Signed)
Chronic Care Management Pharmacy Assistant   Name: EVON DEJARNETT  MRN: [id number] DOB: 1951/01/18  Reason for Encounter: Medication Review/Medication Coordination Call   Recent office visits:  03/08/2021 Rory Percy, DO (PCP Office Visit) for Hospital Follow-up- Increased Citalopram to 30 mg daily, Ordered Cologuard, Bone Density, MM 3D Screen Breast Bilateral, EKG 12-Lead DME Shower Stool. Patient instructed to return in 4 weeks.  Recent consult visits:  None ID  Hospital visits:  Medication Reconciliation was completed by comparing discharge summary, patient's EMR and Pharmacy list, and upon discussion with patient.  Admitted to the hospital on 02/19/2021 due to COPD with Acute Exacerbation, Non-ST Elevation, Myocardial Infarction. Discharge date was 02/26/2021. Discharged from [facility]?Medications Started at [facility] Discharge:?? -START taking: ALPRAZolam (XANAX) 0.25 MG tablet Take 1 tablet (0.25 mg total) by mouth 2 (two) times daily as needed for anxiety. For: Feeling Anxious Brilinta 90 MG Tabs tablet Take 1 tablet (90 mg total) by mouth 2 (two) times daily for 60 doses. For: Acute Coronary Syndrome isosorbide mononitrate (IMDUR) 30 MG 24 hr tablet Take 0.5 tablets (15 mg total) by mouth daily.Start taking on: February 27, 2021  Medication Changes at Hospital Discharge: -CHANGE how you take: atorvastatin 80 MG tablet Take 1 tablet (80 mg total) by mouth daily.  Medications Discontinued at Hospital Discharge: -STOP taking: amLODipine 5 MG tablet (NORVASC)  Medications that remain the same after Hospital Discharge:??  -All other medications will remain the same.    Medications: Outpatient Encounter Medications as of 03/09/2021  Medication Sig   albuterol (VENTOLIN HFA) 108 (90 Base) MCG/ACT inhaler Inhale 1-2 puffs into the lungs every 6 (six) hours as needed for wheezing or shortness of breath.   ALLERGY RELIEF 10 MG tablet  TAKE ONE TABLET BY MOUTH BEFORE BREAKFAST (Patient taking differently: Take 10 mg by mouth daily.)   ALPRAZolam (XANAX) 0.25 MG tablet Take 1 tablet (0.25 mg total) by mouth 2 (two) times daily as needed for anxiety.   aspirin 81 MG EC tablet TAKE 1 TABLET BY MOUTH EVERY DAY   atorvastatin (LIPITOR) 80 MG tablet Take 1 tablet (80 mg total) by mouth daily.   blood glucose meter kit and supplies KIT Dispense based on patient and insurance preference. Use up to four times daily as directed.   citalopram (CELEXA) 20 MG tablet Take 1.5 tablets (30 mg total) by mouth every evening.   fluticasone (FLONASE) 50 MCG/ACT nasal spray Place 2 sprays into both nostrils daily.   ipratropium-albuterol (DUONEB) 0.5-2.5 (3) MG/3ML SOLN Inhale 3 mLs into the lungs every 4 (four) hours as needed.   isosorbide mononitrate (IMDUR) 30 MG 24 hr tablet Take 0.5 tablets (15 mg total) by mouth daily.   losartan (COZAAR) 100 MG tablet TAKE ONE TABLET BY MOUTH EVERY EVENING   metoprolol succinate (TOPROL-XL) 25 MG 24 hr tablet TAKE ONE TABLET BY MOUTH EVERY EVENING   pantoprazole (PROTONIX) 40 MG tablet TAKE ONE TABLET BY MOUTH EVERY EVENING   ticagrelor (BRILINTA) 90 MG TABS tablet Take 1 tablet (90 mg total) by mouth 2 (two) times daily for 60 doses.   TRELEGY ELLIPTA 100-62.5-25 MCG/INH AEPB INHALE ONE PUFF BY MOUTH into THE lungs daily   No facility-administered encounter medications on file as of 03/09/2021.   Care Gaps: Zoster Vaccines- Shingrix  Colonoscopy  COVID-19 Vaccine Booster 3 Mammogram  Dexa Scan  Star Rating Drugs: Atorvastatin 80 mg last filled on 02/26/2021 for a 30-Day supply  with [facility] Losartan 100 mg last filled on 02/12/2021 for a 3-Day supply with [facility]  Reviewed chart for medication changes ahead of medication coordination call.  No OVs, Consults, or hospital visits since last care coordination call/Pharmacist visit. (If appropriate, list visit date, provider name)  No  medication changes indicated OR if recent visit, treatment plan here.  BP Readings from Last 3 Encounters:  03/08/21 126/74  02/26/21 110/84  02/06/21 (!) 143/84    No results found for: HGBA1C   Patient obtains medications through Adherence Packaging  30 Days   Last adherence delivery included:  Trelegy Aer 100-62.5-25  mcg Ellipta- 1 puff by mouth daily Losartan 100 mg-take 1 tablet by mouth every day Evening Meals Metoprolol 25 mg-take 1 tablet Evening Meals Citalopram 20 mg-take one tablet in the Evening Meals Pantoprazole 40 mg- take one tablet Evening Meals Atorvastatin 20 mg- take one tablet daily before breakfast Loratadine 10 mg-take one tablet daily before breakfast Amlodipine 5 mg one tablet daily before breakfast  Patient declined the following Medications: Fluticasone Nasal Spray (PRN)(Patient states patient receive a bottle from the hospital) Albuterol 108 mcg/Act (PRN) Aspirin 81 mg EC (OTC)  Patient is due for next adherence delivery on: 03/19/2021 (Friday) 2nd Route. Called patient and reviewed medications and coordinated delivery.  This delivery to include: Trelegy Aer 100-62.5-25  mcg Ellipta- 1 puff by mouth daily Loratadine 10 mg-take one tablet daily before breakfast Citalopram 20 mg-take one and a half tablets by mouth every evening - Evening Meals Metoprolol 25 mg-take 1 tablet daily- Evening Meals Pantoprazole 40 mg- take one tablet- Evening Meals Losartan 100 mg-take 1 tablet by mouth every day- Evening Meals  Patient will need a short fill of (med), prior to adherence delivery. (To align with sync date or if PRN med)  Coordinated acute fill for (med) to be delivered (date).  Patient declined the following medications: Amlodipine 5 mg one tablet daily before breakfast-Hospital Discontinued via hosptial Atorvastatin 20 mg- take one tablet daily before breakfast-Increased to 80 mg and she has enough to get her to the 24th Fluticasone Nasal Spray  (PRN)(Patient states patient receive a bottle from the hospital) Albuterol 108 mcg/Act (PRN) Aspirin 81 mg EC (OTC)  Patient needs refills for Trelegy 100 mcg, Loratadine 10 mg, Metoprolol Succ ER 25 mg, Pantoprazole 40 mg, Losartan Potassium 100 mg  Confirmed delivery date of 03/19/2021, advised patient that pharmacy will contact them the morning of delivery.  Patient has scheduled visit with [name], CPP on 04/07/2021 @ 1100  Patient reports that she was in the hospital recently due to a heart attack. The patient state that she is feeling better day by day, and she is getting stronger. Patient reports she has been placed on some new medications [facility] not [facility]. I informed her that I will give [facility] a call and request a profile transfer for these medications. The medications that are new that needs to be transferred to [facility] are: Atorvastatin 80 MG tablet Take 1 tablet (80 mg total) by mouth daily. ALPRAZolam (XANAX) 0.25 MG tablet Take 1 tablet (0.25 mg total) by mouth 2 (two) times daily as needed for anxiety. Brilinta 90 MG Tabs tablet Take 1 tablet (90 mg total) by mouth 2 (two) times daily for 60 doses. For: Acute Coronary Syndrome isosorbide mononitrate (IMDUR) 30 MG 24 hr tablet Take 0.5 tablets (15 mg total) by mouth daily.  Patient has enough of these medications to get her  to the 24 th of October.  Spoke with [facility] and requested a profile transfer for the above medications. The pharmacist stated that they will fax these over to [facility].   03/09/2021 LVM requesting patient to return my call 03/10/2021 LVM requesting patient to return my call 03/11/2021 LVM requesting patient to return my call  Lynann Bologna, [facility] Pharmacist Assistant Phone: [phone]

## 2021-03-10 DIAGNOSIS — I10 Essential (primary) hypertension: Secondary | ICD-10-CM | POA: Diagnosis not present

## 2021-03-10 DIAGNOSIS — J441 Chronic obstructive pulmonary disease with (acute) exacerbation: Secondary | ICD-10-CM | POA: Diagnosis not present

## 2021-03-10 DIAGNOSIS — G8929 Other chronic pain: Secondary | ICD-10-CM | POA: Diagnosis not present

## 2021-03-10 DIAGNOSIS — I214 Non-ST elevation (NSTEMI) myocardial infarction: Secondary | ICD-10-CM | POA: Diagnosis not present

## 2021-03-10 DIAGNOSIS — I2510 Atherosclerotic heart disease of native coronary artery without angina pectoris: Secondary | ICD-10-CM | POA: Diagnosis not present

## 2021-03-10 DIAGNOSIS — I429 Cardiomyopathy, unspecified: Secondary | ICD-10-CM | POA: Diagnosis not present

## 2021-03-10 NOTE — Telephone Encounter (Signed)
Verbal order given  

## 2021-03-10 NOTE — Telephone Encounter (Signed)
[facility] is calling checking on verbal orders

## 2021-03-11 ENCOUNTER — Other Ambulatory Visit: Payer: Self-pay

## 2021-03-11 ENCOUNTER — Ambulatory Visit (INDEPENDENT_AMBULATORY_CARE_PROVIDER_SITE_OTHER): Payer: Medicare Other | Admitting: Physician Assistant

## 2021-03-11 ENCOUNTER — Encounter: Payer: Self-pay | Admitting: Physician Assistant

## 2021-03-11 ENCOUNTER — Ambulatory Visit (INDEPENDENT_AMBULATORY_CARE_PROVIDER_SITE_OTHER): Payer: Medicare Other

## 2021-03-11 VITALS — BP 118/70 | HR 67 | Ht 60.0 in | Wt 135.5 lb

## 2021-03-11 DIAGNOSIS — I252 Old myocardial infarction: Secondary | ICD-10-CM

## 2021-03-11 DIAGNOSIS — E785 Hyperlipidemia, unspecified: Secondary | ICD-10-CM | POA: Diagnosis not present

## 2021-03-11 DIAGNOSIS — I10 Essential (primary) hypertension: Secondary | ICD-10-CM | POA: Diagnosis not present

## 2021-03-11 DIAGNOSIS — I700 Atherosclerosis of aorta: Secondary | ICD-10-CM | POA: Diagnosis not present

## 2021-03-11 DIAGNOSIS — I5032 Chronic diastolic (congestive) heart failure: Secondary | ICD-10-CM | POA: Diagnosis not present

## 2021-03-11 DIAGNOSIS — R0602 Shortness of breath: Secondary | ICD-10-CM

## 2021-03-11 DIAGNOSIS — I2510 Atherosclerotic heart disease of native coronary artery without angina pectoris: Secondary | ICD-10-CM

## 2021-03-11 DIAGNOSIS — J449 Chronic obstructive pulmonary disease, unspecified: Secondary | ICD-10-CM

## 2021-03-11 DIAGNOSIS — R609 Edema, unspecified: Secondary | ICD-10-CM

## 2021-03-11 DIAGNOSIS — M7989 Other specified soft tissue disorders: Secondary | ICD-10-CM

## 2021-03-11 DIAGNOSIS — Z87891 Personal history of nicotine dependence: Secondary | ICD-10-CM | POA: Diagnosis not present

## 2021-03-11 NOTE — Patient Instructions (Signed)
Medication Instructions:  Your physician recommends that you continue on your current medications as directed. Please refer to the Current Medication list given to you today.  *If you need a refill on your cardiac medications before your next appointment, please call your pharmacy*   Lab Work: Bmp and Cbc  If you have labs (blood work) drawn today and your tests are completely normal, you will receive your results only by: [place] (if you have MyChart) OR A paper copy in the mail If you have any lab test that is abnormal or we need to change your treatment, we will call you to review the results.   Testing/Procedures: Your physician has requested that you have an echocardiogram. Echocardiography is a painless test that uses sound waves to create images of your heart. It provides your doctor with information about the size and shape of your heart and how well your heart's chambers and valves are working. This procedure takes approximately one hour. There are no restrictions for this procedure.  Your physician has requested that you have a lower extremity arterial exercise duplex. During this test, exercise and ultrasound are used to evaluate arterial blood flow in the legs. Allow one hour for this exam. There are no restrictions or special instructions.    Your physician has requested that you have a lower venous duplex. (To be done today)   Follow-Up: At [facility], you and your health needs are our priority.  As part of our continuing mission to provide you with exceptional heart care, we have created designated Provider Care Teams.  These Care Teams include your primary Cardiologist (physician) and Advanced Practice Providers (APPs -  Physician Assistants and Nurse Practitioners) who all work together to provide you with the care you need, when you need it.  We recommend signing up for the patient portal called "MyChart".  Sign up information is provided on this After Visit  Summary.  MyChart is used to connect with patients for Virtual Visits (Telemedicine).  Patients are able to view lab/test results, encounter notes, upcoming appointments, etc.  Non-urgent messages can be sent to your provider as well.   To learn more about what you can do with MyChart, go to [email or web address].    Your next appointment:   4 week(s)  The format for your next appointment:   In Person  Provider:   You may see Dr. Fletcher Anon or one of the following Advanced Practice Providers on your designated Care Team:   Murray Hodgkins, NP Christell Faith, PA-C Marrianne Mood, PA-C Cadence Kathlen Mody, [place]   Other Instructions N/A

## 2021-03-11 NOTE — Progress Notes (Signed)
Office Visit    Patient Name: Terri Burns Date of Encounter: 03/11/2021  PCP:  Steele Sizer, MD   [place]  Cardiologist:  Dr. Fletcher Anon Advanced Practice Provider:  No care team member to display Electrophysiologist:  None :[phone]}   Chief Complaint    Chief Complaint  Patient presents with   Other    Post cath no complaints today. Meds reviewed verbally with pt.    70 y.o. female  with a h/o severe one-vessel CAD and recent LHC with PCI to LAD, 02/2021 non-STEMI, HFpEF, COPD, current tobacco use, hypertension, hyperlipidemia, and seen today for hospital/cath follow-up.  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    Hyperlipidemia    Hypertension    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   Past Surgical History:  Procedure Laterality Date   CORONARY STENT INTERVENTION N/A 02/22/2021   Procedure: CORONARY STENT INTERVENTION;  Surgeon: Wellington Hampshire, MD;  Location: [facility];  Service: Cardiovascular;  Laterality: N/A;   LEFT HEART CATH AND CORONARY ANGIOGRAPHY N/A 02/22/2021   Procedure: LEFT HEART CATH AND CORONARY ANGIOGRAPHY;  Surgeon: Wellington Hampshire, MD;  Location: [place] [facility];  Service: Cardiovascular;  Laterality: N/A;   NECK SURGERY N/A 10/2003   SHOULDER SURGERY Left 2004    Allergies  Allergies  Allergen Reactions   Levofloxacin Shortness Of Breath   Penicillins Hives    itching   Meloxicam     dizziness   Nsaids Other (See Comments)    Patient states she can tolerate up to three doses per day without incident    History of Present Illness    Terri Burns is a 70 y.o. female with PMH as above.  Prior to her most recent admission, she had no  history of heart disease or arrhythmia.  Family history included history of heart attack in her father in his 6s.  She also had a family history of hypertension.  She reported current tobacco use at the quarter pack per day.  She was drinking alcohol several times per month.  On 02/19/2021, she presented to [facility] ED with shortness of breath.  She was admitted and subsequently had chest tightness 9/18 around 5 AM.  This chest tightness/pressure continued over the next 2 hours despite administration of painkillers and breathing treatments.  At 1 point, the chest tightness radiating to under her left arm.  She also recalled pain between her shoulder blades the previous night.  EKG showed Q waves in the inferior 2, 3, aVF and precordial lateral leads, as well as T wave inversion in upsloping ST changes.  High-sensitivity troponin was cycled and 308, though this lab was not cycled further to determine a peak in her troponin.  Recommendation was for further ischemic workup given RF for CAD and via LHC.  LHC 02/23/2021 showed severe one-vessel CAD with 99% thrombotic stenosis of the proximal LAD, which was not the culprit of her non-ST elevation myocardial infarction.  Mild to moderate left circumflex and RCA disease was also noted.  In addition, moderately to severely reduced LV SF with EF 25 to 30% and severe anterior/apical hypokinesis is noted.  She had severely elevated  LVEDP at 40 mmHg.  Successful balloon angioplasty with aspiration thrombectomy and drug-eluting stent placement to the proximal LAD was performed.  Initially, the plan was to MACE intravascular lithotripsy given the calcifications; however, she developed chest pain with sluggish flow to the LAD just before PCI was started.  Recommendations were to continue DAPT for at least 12 months with aggressive treatment of risk factors.  She was also given IV furosemide for her elevated volume status.  The remainder of her CAD was to be treated medically.     Echo, performed 6, showed EF 55 to 60%, G1 DD, mild to moderate aortic valve sclerosis without stenosis.    At discharge, it was noted that further titration of GDMT was limited by hypotension and bradycardia.  Repeat limited echo at follow-up was recommended due to inconsistency in patient EF, specifically catheterization showed EF 25 to 30% and echo EF 55 to 60%.  Amlodipine was held due to low BP. PT/OT consulted with pt report she was living alone and uses a walker at home.  Today, 03/11/2021, she returns to clinic and is joined today by her family.  She denies any chest pain.  She does report one episode of acute shortness of breath that occurred while taking a shower.  She reports that this occurred during a football game.  She noticed bilateral lower extremity edema with left lower extremity greater than that of right right before the acute shortness of breath while taking a shower and resolving shortly thereafter.  She cannot recall if the shower was hot in temperature.  She does report feeling shortness of breath when trying to do things at home.  She reports blue toes of the left lower extremity.  No orthopnea, PND or early satiety.  She feels winded when going from the car to the door, which is new for her.  She denies any chest pain with exertion.  She has new palpitations since discharge.  She reports that her right radial arteriotomy site has been doing well.  BP at home 133/88 at the highest and 102/60 at the lowest with associated dizziness.  Heart rate 65 to 78 bpm.  She is still holding her amlodipine.  She usually drinks 212 ounce diet Pepsi's per day.  She has coffee in the morning and a couple of cups of water thereafter.  She reports drinking her water out of a coffee mug that is 10 to 12 ounces.  She reports complete smoking cessation for at least 1 week.  Home Medications   Current Outpatient Medications  Medication Instructions   albuterol (VENTOLIN HFA) 108 (90 Base) MCG/ACT  inhaler 1-2 puffs, Inhalation, Every 6 hours PRN   ALLERGY RELIEF 10 MG tablet TAKE ONE TABLET BY MOUTH BEFORE BREAKFAST   ALPRAZolam (XANAX) 0.25 mg, Oral, 2 times daily PRN   aspirin 81 MG EC tablet TAKE 1 TABLET BY MOUTH EVERY DAY   atorvastatin (LIPITOR) 80 mg, Oral, Daily   blood glucose meter kit and supplies KIT Dispense based on patient and insurance preference. Use up to four times daily as directed.   Brilinta 90 mg, Oral, 2 times daily   citalopram (CELEXA) 30 mg, Oral, Every evening   fluticasone (FLONASE) 50 MCG/ACT nasal spray 2 sprays, Each Nare, Daily   ipratropium-albuterol (DUONEB) 0.5-2.5 (3) MG/3ML SOLN 3 mLs, Inhalation, Every 4 hours PRN   isosorbide mononitrate (IMDUR) 15 mg, Oral, Daily   losartan (COZAAR) 100 MG tablet TAKE ONE TABLET BY MOUTH EVERY EVENING  metoprolol succinate (TOPROL-XL) 25 MG 24 hr tablet TAKE ONE TABLET BY MOUTH EVERY EVENING   pantoprazole (PROTONIX) 40 MG tablet TAKE ONE TABLET BY MOUTH EVERY EVENING   TRELEGY ELLIPTA 100-62.5-25 MCG/INH AEPB INHALE ONE PUFF BY MOUTH into THE lungs daily     Review of Systems    She denies chest pain, pnd, orthopnea, n, v, dizziness, syncope, weight gain, or early satiety.  She reports an episode of acute shortness of breath and swelling of the lower extremities with left greater than that of right that occurred when taking a shower of unknown temperature and resolving shortly thereafter.  She reports blue toes.  She reports dyspnea with minimal activity.  She reports new palpitations.   All other systems reviewed and are otherwise negative except as noted above.  Physical Exam    VS:  BP 118/70 (BP Location: Left Arm, Patient Position: Sitting, Cuff Size: Normal)   Pulse 67   Ht 5' (1.524 m)   Wt 135 lb 8 oz (61.5 kg)   SpO2 96%   BMI 26.46 kg/m  , BMI Body mass index is 26.46 kg/m. GEN: Frail female, in no acute distress.  Joined by her family.  She is no longer wearing a neck brace. HEENT:  normal. Neck: Supple, no JVD, carotid bruits, or masses. Cardiac: RRR, no murmurs, rubs, or gallops. No clubbing, cyanosis, edema.  No asymmetric edema, asymmetric erythema, palpable cord.  Radials/DP/PT 2+ and equal bilaterally.  Right radial arteriotomy site without any bruit, hematoma, or evidence of infection. Respiratory:  Respirations regular and unlabored, reduced breath sounds bilaterally. GI: Soft, nontender, nondistended, BS + x 4. MS: no deformity or atrophy. Skin: warm and dry, no rash. Neuro:  Strength and sensation are intact. Psych: Normal affect.  Accessory Clinical Findings    ECG personally reviewed by me today - SR with PACs, poor R wave in III,q in lateral leads - no acute changes.  VITALS Reviewed today   Temp Readings from Last 3 Encounters:  03/08/21 97.6 F (36.4 C)  02/26/21 98.5 F (36.9 C) (Oral)  02/06/21 98 F (36.7 C)   BP Readings from Last 3 Encounters:  03/08/21 126/74  02/26/21 110/84  02/06/21 (!) 143/84   Pulse Readings from Last 3 Encounters:  03/08/21 72  02/26/21 67  02/06/21 (!) 104    Wt Readings from Last 3 Encounters:  03/08/21 136 lb 4.8 oz (61.8 kg)  02/25/21 138 lb 14.2 oz (63 kg)  02/02/21 142 lb 6.7 oz (64.6 kg)     LABS  reviewed today   Lab Results  Component Value Date   WBC 9.4 02/23/2021   HGB 12.4 02/23/2021   HCT 37.8 02/23/2021   MCV 89.4 02/23/2021   PLT 210 02/23/2021   Lab Results  Component Value Date   CREATININE 0.67 02/24/2021   BUN 26 (H) 02/24/2021   NA 133 (L) 02/24/2021   K 4.2 02/24/2021   CL 101 02/24/2021   CO2 28 02/24/2021   Lab Results  Component Value Date   ALT 52 (H) 02/19/2021   AST 59 (H) 02/19/2021   ALKPHOS 55 02/19/2021   BILITOT 0.4 02/19/2021   Lab Results  Component Value Date   CHOL 173 02/21/2020   HDL 101 02/21/2020   LDLCALC 61 02/21/2020   LDLDIRECT 45.5 02/25/2021   TRIG 39 02/21/2020   CHOLHDL 1.7 02/21/2020    No results found for: HGBA1C No results  found for: TSH   STUDIES/PROCEDURES reviewed today  Cath Left Heart   Left Ventricle The left ventricle is mildly dilated. There is moderate to severe left ventricular systolic dysfunction. LV end diastolic pressure is severely elevated. The left ventricular ejection fraction is 25-35% by visual estimate. There are LV function abnormalities due to segmental dysfunction.    Coronary Diagrams   Diagnostic Dominance: Right Intervention    02/22/21   Prox RCA to Mid RCA lesion is 60% stenosed.   Ost LAD to Prox LAD lesion is 25% stenosed.   Ost Cx to Prox Cx lesion is 40% stenosed.   1st Mrg lesion is 30% stenosed.   Prox LAD lesion is 99% stenosed.   A drug-eluting stent was successfully placed using a STENT ONYX FRONTIER 3.0X18.   Post intervention, there is a 0% residual stenosis.   There is moderate to severe left ventricular systolic dysfunction.   LV end diastolic pressure is severely elevated.   The left ventricular ejection fraction is 25-35% by visual estimate.   1.  Severe one-vessel coronary artery disease with 99% thrombotic stenosis in the proximal LAD which seems to be the culprit for non-ST elevation myocardial infarction.  In addition, there is mild to moderate left circumflex and RCA disease. 2.  Moderately to severely reduced LV systolic function with an EF of 25 to 30% with severe anterior/apical hypokinesis. 3.  Severely elevated left ventricular end-diastolic pressure at 40 mmHg. 4.  Successful balloon angioplasty, aspiration thrombectomy and drug-eluting stent placement to the proximal LAD.  The initial plan was to use intravascular lithotripsy given calcifications but the patient developed chest pain with sluggish flow in the LAD just before the PCI was started. Recommendations: Dual antiplatelet therapy for at least 12 months. Aggressive treatment of risk factors. The patient was given 1 dose of IV furosemide 20 mg during the case with good urine output.   Monitor respiratory status and consider additional diuresis as needed. Treat the rest of the coronary artery disease medically.   Echo 02/22/21  1. Left ventricular ejection fraction, by estimation, is 55 to 60%. The  left ventricle has normal function. Left ventricular endocardial border  not optimally defined to evaluate regional wall motion. Left ventricular  diastolic parameters are consistent  with Grade I diastolic dysfunction (impaired relaxation).   2. Right ventricular systolic function is normal. The right ventricular  size is normal. Tricuspid regurgitation signal is inadequate for assessing  PA pressure.   3. The mitral valve is normal in structure. No evidence of mitral valve  regurgitation. No evidence of mitral stenosis.   4. The aortic valve is normal in structure. Aortic valve regurgitation is  not visualized. Mild to moderate aortic valve sclerosis/calcification is  present, without any evidence of aortic stenosis.   5. The inferior vena cava is normal in size with greater than 50%  respiratory variability, suggesting right atrial pressure of 3 mmHg.    Assessment & Plan    Coronary artery disease s/p PCI to pLAD  History of Non-STEMI -- No CP.  Reports DOE and acute SOB episode during a shower as in HPI above.  R radial arteriotomy site doing well s/p LHC 9/19 with severe 1V CAD and 99%s of pLAD, thought the likely culprit for NSTEMI.  Mild to moderate Lcx and RCA disease also noted.  Balloon angioplasty and DES placed to the proximal LAD.   Medical management: ASA and Brilinta with PPI, Toprol, statin, Imdur, and PRN SL nitro.   Labs: BMET, CBC Risk factor modification/aggressive treatment of risk  factors. Repeat echo as requested at discharge Confirm EF - discrepancy between echo and cath Reassess for acute structural changes s/p episode of acute SOB as above.  Discussed lower suspicion for DVT/PE given overall description and today's exam and vitals.  Also, LE [place]  negative for DVT today.  Chronic HFpEF  -- Reports DOE and an episode of acute shortness of breath as above with today's LE [place] negative for DVT and vitals stable / exam unrevealing.  Euvolemic and well compensated on exam.  Repeat echo pending to reassess EF given discrepancy by cath versus echo.  By cath EF 25 to 30% with severe anterior/apical hypokinesis, though noted to be 55-60% by echo.   LVEDP 40 mmHg with diuresis started during admission.  We did discuss how her episode of acute shortness of breath could be due to salt intake with patient understanding. Continue losartan, Imdur, Toprol.   Given soft BP today, will continue to defer diuretic. Repeat echo as above to confirm EF. CHF education -she will cut back on salt and monitor fluid intake/weights.   Essential hypertension - BP today acceptable.  She will start to check her BP at home and cut back on salt intake.  Continue losartan, Imdur, metoprolol. Salt and fluid restrictions reviewed.    Asymmetric lower extremity edema Blue toes of the left lower extremity --Reports an episode of acute shortness of breath and asymmetric edema that occurred in the shower, resolving shortly thereafter.  On exam today, no asymmetric edema, palpable cord, warmth/erythema noted. LE [place] in office negative for DVT and vitals not consistent with PE. Echo can look for RH strain. Awaiting arterial studies for report of blue toes.   Palpitations, rare, new --Reports rare palpitations, new from discharge.  Check BMET /electrolytes.  Check CBC.  Repeat echo pending. Limit caffeine/soda intake.  Limit salt intake.  Monitor BP and heart rate at home.  Increase activity as tolerated.  Reassess symptoms at RTC.  Will defer Zio monitor at this time.   Tobacco use, quit smoking 1 week ago -- Congratulations provided for smoking cessation for at least 1 week.  Ongoing complete smoking cessation advised.  Follow-up as directed with pulmonology.    Medication  changes: None, diuretic deferred Labs ordered: BMET, CBC Studies / Imaging ordered: Echo given discrepancy in EF / recent acute SOB with asymmetric LEE. LE [place] without DVT.  Arterial study pending for blue toes. Future considerations:? Diuretic, ?still not smoking? Disposition: RTC 1 month  *Please be aware that the above documentation was completed voice recognition software and may contain dictation errors.       Arvil Chaco, PA-C 03/11/2021

## 2021-03-12 ENCOUNTER — Other Ambulatory Visit: Payer: Self-pay | Admitting: Family Medicine

## 2021-03-12 DIAGNOSIS — I2510 Atherosclerotic heart disease of native coronary artery without angina pectoris: Secondary | ICD-10-CM

## 2021-03-12 DIAGNOSIS — K219 Gastro-esophageal reflux disease without esophagitis: Secondary | ICD-10-CM

## 2021-03-12 DIAGNOSIS — I10 Essential (primary) hypertension: Secondary | ICD-10-CM

## 2021-03-12 DIAGNOSIS — I700 Atherosclerosis of aorta: Secondary | ICD-10-CM

## 2021-03-12 DIAGNOSIS — J3089 Other allergic rhinitis: Secondary | ICD-10-CM

## 2021-03-12 LAB — BASIC METABOLIC PANEL
BUN/Creatinine Ratio: 15 (ref 12–28)
BUN: 13 mg/dL (ref 8–27)
CO2: 19 mmol/L — ABNORMAL LOW (ref 20–29)
Calcium: 9 mg/dL (ref 8.7–10.3)
Chloride: 102 mmol/L (ref 96–106)
Creatinine, Ser: 0.84 mg/dL (ref 0.57–1.00)
Glucose: 76 mg/dL (ref 70–99)
Potassium: 5.1 mmol/L (ref 3.5–5.2)
Sodium: 139 mmol/L (ref 134–144)
eGFR: 75 mL/min/{1.73_m2} (ref >59)

## 2021-03-12 LAB — CBC WITH DIFFERENTIAL/PLATELET
Basophils Absolute: 0.1 10*3/uL (ref 0.0–0.2)
Basos: 1 %
EOS (ABSOLUTE): 0.2 10*3/uL (ref 0.0–0.4)
Eos: 2 %
Hematocrit: 35.6 % (ref 34.0–46.6)
Hemoglobin: 11.9 g/dL (ref 11.1–15.9)
Immature Grans (Abs): 0.1 10*3/uL (ref 0.0–0.1)
Immature Granulocytes: 1 %
Lymphocytes Absolute: 1.5 10*3/uL (ref 0.7–3.1)
Lymphs: 18 %
MCH: 28.8 pg (ref 26.6–33.0)
MCHC: 33.4 g/dL (ref 31.5–35.7)
MCV: 86 fL (ref 79–97)
Monocytes Absolute: 0.7 10*3/uL (ref 0.1–0.9)
Monocytes: 9 %
Neutrophils Absolute: 5.5 10*3/uL (ref 1.4–7.0)
Neutrophils: 69 %
Platelets: 220 10*3/uL (ref 150–450)
RBC: 4.13 x10E6/uL (ref 3.77–5.28)
RDW: 14 % (ref 11.7–15.4)
WBC: 8.1 10*3/uL (ref 3.4–10.8)

## 2021-03-14 ENCOUNTER — Encounter: Payer: Self-pay | Admitting: Physician Assistant

## 2021-03-15 DIAGNOSIS — J441 Chronic obstructive pulmonary disease with (acute) exacerbation: Secondary | ICD-10-CM | POA: Diagnosis not present

## 2021-03-15 DIAGNOSIS — I10 Essential (primary) hypertension: Secondary | ICD-10-CM | POA: Diagnosis not present

## 2021-03-15 DIAGNOSIS — I214 Non-ST elevation (NSTEMI) myocardial infarction: Secondary | ICD-10-CM | POA: Diagnosis not present

## 2021-03-15 DIAGNOSIS — I429 Cardiomyopathy, unspecified: Secondary | ICD-10-CM | POA: Diagnosis not present

## 2021-03-15 DIAGNOSIS — I2510 Atherosclerotic heart disease of native coronary artery without angina pectoris: Secondary | ICD-10-CM | POA: Diagnosis not present

## 2021-03-15 DIAGNOSIS — G8929 Other chronic pain: Secondary | ICD-10-CM | POA: Diagnosis not present

## 2021-03-15 MED ORDER — LORATADINE 10 MG PO TABS: 10.0000 mg | ORAL_TABLET | Freq: Every day | ORAL | 1 refills | Status: DC

## 2021-03-15 MED ORDER — LOSARTAN POTASSIUM 100 MG PO TABS: 100.0000 mg | ORAL_TABLET | Freq: Every evening | ORAL | 1 refills | Status: DC

## 2021-03-15 MED ORDER — METOPROLOL SUCCINATE ER 25 MG PO TB24: 25.0000 mg | ORAL_TABLET | Freq: Every evening | ORAL | 1 refills | Status: DC

## 2021-03-15 MED ORDER — TRELEGY ELLIPTA 100-62.5-25 MCG/INH IN AEPB: 1.0000 | INHALATION_SPRAY | Freq: Every day | RESPIRATORY_TRACT | 3 refills | Status: DC

## 2021-03-15 MED ORDER — PANTOPRAZOLE SODIUM 40 MG PO TBEC: 40.0000 mg | DELAYED_RELEASE_TABLET | Freq: Every evening | ORAL | 1 refills | Status: DC

## 2021-03-15 NOTE — Addendum Note (Signed)
Addended by: Daron Offer A on: 03/15/2021 08:58 AM   Modules accepted: Orders

## 2021-03-16 ENCOUNTER — Telehealth: Payer: Self-pay

## 2021-03-16 NOTE — Progress Notes (Signed)
    Chronic Care Management Pharmacy Assistant   Name: Terri Burns  MRN: [id number] DOB: 12/17/1950  I received a voicemail from patient requesting I give her a call back when I have a chance.  Contacted patient back and she wanted to inform me that she needs her scheduled medication delivery to be delivered to her son's address as she will be there the rest of the week. Patient stated she contacted the pharmacy and advised them. I informed patient I would do a follow-up message just to make sure that her medications are actually delivered to her son's address. Patient stated that the pharmacy has his address as they have delivered to the home in the past.   I sent a message to the pharmacy team with this information just to make sure that they do understand that the patient's medication delivery should go to the patient's on home.   Chasity with [facility] responded and wanted to verify the correct address for the patient's son. I reached back out to verify that the [facility] address is the correct address for them to deliver the medications to. Per patient it is the [place] address. I confirmed with [facility], and they will take care of this for the patient.    Medications: Outpatient Encounter Medications as of 03/16/2021  Medication Sig   albuterol (VENTOLIN HFA) 108 (90 Base) MCG/ACT inhaler Inhale 1-2 puffs into the lungs every 6 (six) hours as needed for wheezing or shortness of breath.   ALPRAZolam (XANAX) 0.25 MG tablet Take 1 tablet (0.25 mg total) by mouth 2 (two) times daily as needed for anxiety.   aspirin 81 MG EC tablet TAKE 1 TABLET BY MOUTH EVERY DAY   atorvastatin (LIPITOR) 80 MG tablet Take 1 tablet (80 mg total) by mouth daily.   blood glucose meter kit and supplies KIT Dispense based on patient and insurance preference. Use up to four times daily as directed.   citalopram (CELEXA) 20 MG tablet Take 1.5 tablets (30 mg total) by mouth every evening.   fluticasone (FLONASE) 50  MCG/ACT nasal spray Place 2 sprays into both nostrils daily.   Fluticasone-Umeclidin-Vilant (TRELEGY ELLIPTA) 100-62.5-25 MCG/INH AEPB Inhale 1 puff into the lungs daily.   ipratropium-albuterol (DUONEB) 0.5-2.5 (3) MG/3ML SOLN Inhale 3 mLs into the lungs every 4 (four) hours as needed.   isosorbide mononitrate (IMDUR) 30 MG 24 hr tablet Take 0.5 tablets (15 mg total) by mouth daily.   loratadine (ALLERGY RELIEF) 10 MG tablet Take 1 tablet (10 mg total) by mouth daily.   losartan (COZAAR) 100 MG tablet Take 1 tablet (100 mg total) by mouth every evening.   metoprolol succinate (TOPROL-XL) 25 MG 24 hr tablet Take 1 tablet (25 mg total) by mouth every evening.   pantoprazole (PROTONIX) 40 MG tablet Take 1 tablet (40 mg total) by mouth every evening.   ticagrelor (BRILINTA) 90 MG TABS tablet Take 1 tablet (90 mg total) by mouth 2 (two) times daily for 60 doses.   No facility-administered encounter medications on file as of 03/16/2021.     Lynann Bologna, CPA/CMA Clinical Pharmacist Assistant Phone: [phone]

## 2021-03-17 ENCOUNTER — Telehealth: Payer: Self-pay

## 2021-03-17 DIAGNOSIS — I10 Essential (primary) hypertension: Secondary | ICD-10-CM | POA: Diagnosis not present

## 2021-03-17 DIAGNOSIS — I429 Cardiomyopathy, unspecified: Secondary | ICD-10-CM | POA: Diagnosis not present

## 2021-03-17 DIAGNOSIS — J441 Chronic obstructive pulmonary disease with (acute) exacerbation: Secondary | ICD-10-CM | POA: Diagnosis not present

## 2021-03-17 DIAGNOSIS — I2510 Atherosclerotic heart disease of native coronary artery without angina pectoris: Secondary | ICD-10-CM | POA: Diagnosis not present

## 2021-03-17 DIAGNOSIS — I214 Non-ST elevation (NSTEMI) myocardial infarction: Secondary | ICD-10-CM | POA: Diagnosis not present

## 2021-03-17 DIAGNOSIS — G8929 Other chronic pain: Secondary | ICD-10-CM | POA: Diagnosis not present

## 2021-03-17 NOTE — Progress Notes (Signed)
    Chronic Care Management Pharmacy Assistant   Name: Terri Burns  MRN: [id number] DOB: 09/24/1950  Received a message from [facility] with [facility] stating the patient called and said she has about 10 days left on her new medications that were prescribed to her when she was admitted to the hospital. I informed [name] that I contacted [facility] on Monday and requested the profile transfer, and was advised the medication would be faxed over. I informed [name] that I would contact [facility] again with the request.  Spoke with the Pharmacist at [facility], and informed them that I called on Monday to request a profile transfer, and to see if they could do this today as the patient is almost out of her medications. The pharmacist stated she would go ahead and get this done. [facility] has been notified with the update, and to be on the look out for the fax [facility] is sending.  Medications: Outpatient Encounter Medications as of 03/17/2021  Medication Sig   albuterol (VENTOLIN HFA) 108 (90 Base) MCG/ACT inhaler Inhale 1-2 puffs into the lungs every 6 (six) hours as needed for wheezing or shortness of breath.   ALPRAZolam (XANAX) 0.25 MG tablet Take 1 tablet (0.25 mg total) by mouth 2 (two) times daily as needed for anxiety.   aspirin 81 MG EC tablet TAKE 1 TABLET BY MOUTH EVERY DAY   atorvastatin (LIPITOR) 80 MG tablet Take 1 tablet (80 mg total) by mouth daily.   blood glucose meter kit and supplies KIT Dispense based on patient and insurance preference. Use up to four times daily as directed.   citalopram (CELEXA) 20 MG tablet Take 1.5 tablets (30 mg total) by mouth every evening.   fluticasone (FLONASE) 50 MCG/ACT nasal spray Place 2 sprays into both nostrils daily.   Fluticasone-Umeclidin-Vilant (TRELEGY ELLIPTA) 100-62.5-25 MCG/INH AEPB Inhale 1 puff into the lungs daily.   ipratropium-albuterol (DUONEB) 0.5-2.5 (3) MG/3ML SOLN Inhale 3 mLs into the lungs every 4 (four) hours as needed.   isosorbide mononitrate  (IMDUR) 30 MG 24 hr tablet Take 0.5 tablets (15 mg total) by mouth daily.   loratadine (ALLERGY RELIEF) 10 MG tablet Take 1 tablet (10 mg total) by mouth daily.   losartan (COZAAR) 100 MG tablet Take 1 tablet (100 mg total) by mouth every evening.   metoprolol succinate (TOPROL-XL) 25 MG 24 hr tablet Take 1 tablet (25 mg total) by mouth every evening.   pantoprazole (PROTONIX) 40 MG tablet Take 1 tablet (40 mg total) by mouth every evening.   ticagrelor (BRILINTA) 90 MG TABS tablet Take 1 tablet (90 mg total) by mouth 2 (two) times daily for 60 doses.   No facility-administered encounter medications on file as of 03/17/2021.    Lynann Bologna, CPA/CMA Clinical Pharmacist Assistant Phone: [phone]

## 2021-03-18 DIAGNOSIS — I214 Non-ST elevation (NSTEMI) myocardial infarction: Secondary | ICD-10-CM | POA: Diagnosis not present

## 2021-03-18 DIAGNOSIS — G8929 Other chronic pain: Secondary | ICD-10-CM | POA: Diagnosis not present

## 2021-03-18 DIAGNOSIS — J441 Chronic obstructive pulmonary disease with (acute) exacerbation: Secondary | ICD-10-CM | POA: Diagnosis not present

## 2021-03-18 DIAGNOSIS — I2510 Atherosclerotic heart disease of native coronary artery without angina pectoris: Secondary | ICD-10-CM | POA: Diagnosis not present

## 2021-03-18 DIAGNOSIS — I429 Cardiomyopathy, unspecified: Secondary | ICD-10-CM | POA: Diagnosis not present

## 2021-03-18 DIAGNOSIS — I10 Essential (primary) hypertension: Secondary | ICD-10-CM | POA: Diagnosis not present

## 2021-03-22 DIAGNOSIS — G8929 Other chronic pain: Secondary | ICD-10-CM | POA: Diagnosis not present

## 2021-03-22 DIAGNOSIS — I10 Essential (primary) hypertension: Secondary | ICD-10-CM | POA: Diagnosis not present

## 2021-03-22 DIAGNOSIS — I214 Non-ST elevation (NSTEMI) myocardial infarction: Secondary | ICD-10-CM | POA: Diagnosis not present

## 2021-03-22 DIAGNOSIS — I429 Cardiomyopathy, unspecified: Secondary | ICD-10-CM | POA: Diagnosis not present

## 2021-03-22 DIAGNOSIS — I2510 Atherosclerotic heart disease of native coronary artery without angina pectoris: Secondary | ICD-10-CM | POA: Diagnosis not present

## 2021-03-22 DIAGNOSIS — J441 Chronic obstructive pulmonary disease with (acute) exacerbation: Secondary | ICD-10-CM | POA: Diagnosis not present

## 2021-03-24 DIAGNOSIS — I429 Cardiomyopathy, unspecified: Secondary | ICD-10-CM | POA: Diagnosis not present

## 2021-03-24 DIAGNOSIS — I10 Essential (primary) hypertension: Secondary | ICD-10-CM | POA: Diagnosis not present

## 2021-03-24 DIAGNOSIS — I214 Non-ST elevation (NSTEMI) myocardial infarction: Secondary | ICD-10-CM | POA: Diagnosis not present

## 2021-03-24 DIAGNOSIS — G8929 Other chronic pain: Secondary | ICD-10-CM | POA: Diagnosis not present

## 2021-03-24 DIAGNOSIS — I2510 Atherosclerotic heart disease of native coronary artery without angina pectoris: Secondary | ICD-10-CM | POA: Diagnosis not present

## 2021-03-24 DIAGNOSIS — J441 Chronic obstructive pulmonary disease with (acute) exacerbation: Secondary | ICD-10-CM | POA: Diagnosis not present

## 2021-03-25 ENCOUNTER — Other Ambulatory Visit: Payer: Self-pay

## 2021-03-25 ENCOUNTER — Telehealth: Payer: Self-pay

## 2021-03-25 DIAGNOSIS — I10 Essential (primary) hypertension: Secondary | ICD-10-CM | POA: Diagnosis not present

## 2021-03-25 DIAGNOSIS — G8929 Other chronic pain: Secondary | ICD-10-CM | POA: Diagnosis not present

## 2021-03-25 DIAGNOSIS — I214 Non-ST elevation (NSTEMI) myocardial infarction: Secondary | ICD-10-CM | POA: Diagnosis not present

## 2021-03-25 DIAGNOSIS — I2510 Atherosclerotic heart disease of native coronary artery without angina pectoris: Secondary | ICD-10-CM | POA: Diagnosis not present

## 2021-03-25 DIAGNOSIS — I429 Cardiomyopathy, unspecified: Secondary | ICD-10-CM | POA: Diagnosis not present

## 2021-03-25 DIAGNOSIS — J441 Chronic obstructive pulmonary disease with (acute) exacerbation: Secondary | ICD-10-CM | POA: Diagnosis not present

## 2021-03-25 NOTE — Progress Notes (Addendum)
Chronic Care Management Pharmacy Assistant   Name: Terri Burns  MRN: [id number] DOB: 05-23-1951  Patient called, and left me a voice mail requesting I call her back in regards to some refills that she is going to need.  Patient stated that she is in need of her Atorvastatin 80 mg, Brilinta 90 mg, and her Imdur 30 mg as she is almost out of all the medications.   I completed the Acute Form for the request of all medications, and uploaded for approval from CPP. I sent CPP a message informing him that the form was uploaded to his folder for him to approve. I also contacted [facility] to request that they transfer the patient's Brilinta over to [facility].   03/26/2021  Chasity with [facility] sent a message informing that they did not receive the transfer from [place] for the patient's [place]. I reached out to [facility], and when they pulled the patient's profile again they stated the reason the transfer was not successful the patient does not have any more refills on this medication.   I reached out to Dr. Donivan Scull office to request refills for the patient, and spoke with Pilar and she stated that they would get the refill sent over today.   Chasity with [facility] updated.   Chasity with [facility] stated that for the patient's Isosorbide the insurance will not cover this medication until 04/11/2021. The last time it appears to be filled according to the system was 02/26/2021 for a 60 Day supply and the patient takes 1/2 a pill daily so she should still have plenty  of this medication left. I called patient to inform her, but had to leave a voice mail with the update.   Medications: Outpatient Encounter Medications as of 03/25/2021  Medication Sig   albuterol (VENTOLIN HFA) 108 (90 Base) MCG/ACT inhaler Inhale 1-2 puffs into the lungs every 6 (six) hours as needed for wheezing or shortness of breath.   ALPRAZolam (XANAX) 0.25 MG tablet Take 1 tablet (0.25 mg total) by mouth 2  (two) times daily as needed for anxiety.   aspirin 81 MG EC tablet TAKE 1 TABLET BY MOUTH EVERY DAY   atorvastatin (LIPITOR) 80 MG tablet Take 1 tablet (80 mg total) by mouth daily.   blood glucose meter kit and supplies KIT Dispense based on patient and insurance preference. Use up to four times daily as directed.   citalopram (CELEXA) 20 MG tablet Take 1.5 tablets (30 mg total) by mouth every evening.   fluticasone (FLONASE) 50 MCG/ACT nasal spray Place 2 sprays into both nostrils daily.   Fluticasone-Umeclidin-Vilant (TRELEGY ELLIPTA) 100-62.5-25 MCG/INH AEPB Inhale 1 puff into the lungs daily.   ipratropium-albuterol (DUONEB) 0.5-2.5 (3) MG/3ML SOLN Inhale 3 mLs into the lungs every 4 (four) hours as needed.   isosorbide mononitrate (IMDUR) 30 MG 24 hr tablet Take 0.5 tablets (15 mg total) by mouth daily.   loratadine (ALLERGY RELIEF) 10 MG tablet Take 1 tablet (10 mg total) by mouth daily.   losartan (COZAAR) 100 MG tablet Take 1 tablet (100 mg total) by mouth every evening.   metoprolol succinate (TOPROL-XL) 25 MG 24 hr tablet Take 1 tablet (25 mg total) by mouth every evening.   pantoprazole (PROTONIX) 40 MG tablet Take 1 tablet (40 mg total) by mouth every evening.   ticagrelor (BRILINTA) 90 MG TABS tablet Take 1 tablet (90 mg total) by mouth 2 (two) times daily for 60 doses.   No facility-administered encounter  medications on file as of 03/25/2021.   Lynann Bologna, CPA/CMA Clinical Pharmacist Assistant Phone: [phone]

## 2021-03-26 ENCOUNTER — Other Ambulatory Visit: Payer: Self-pay

## 2021-03-26 ENCOUNTER — Telehealth: Payer: Self-pay | Admitting: Cardiovascular Disease

## 2021-03-26 MED ORDER — TICAGRELOR 90 MG PO TABS: 90.0000 mg | ORAL_TABLET | Freq: Two times a day (BID) | ORAL | 0 refills | Status: DC

## 2021-03-26 MED ORDER — TICAGRELOR 90 MG PO TABS: 90.0000 mg | ORAL_TABLET | Freq: Two times a day (BID) | ORAL | 5 refills | Status: DC

## 2021-03-26 NOTE — Telephone Encounter (Signed)
Requested Prescriptions   Signed Prescriptions Disp Refills   ticagrelor (BRILINTA) 90 MG TABS tablet 60 tablet 0    Sig: Take 1 tablet (90 mg total) by mouth 2 (two) times daily for 60 doses.    Authorizing Provider: Kathlyn Sacramento A    Ordering User: Raelene Bott, Ricarda Atayde L

## 2021-03-26 NOTE — Telephone Encounter (Signed)
*  STAT* If patient is at the pharmacy, call can be transferred to refill team.   1. Which medications need to be refilled? (please list name of each medication and dose if known) Brilinta 90mg  1 tablet twice a day  2. Which pharmacy/location (including street and city if local pharmacy) is medication to be sent to? [facility], [place]  3. Do they need a 30 day or 90 day supply? 30 day

## 2021-03-29 ENCOUNTER — Ambulatory Visit: Payer: Medicare Other | Admitting: Family Medicine

## 2021-03-29 DIAGNOSIS — Z9181 History of falling: Secondary | ICD-10-CM | POA: Diagnosis not present

## 2021-03-29 DIAGNOSIS — K219 Gastro-esophageal reflux disease without esophagitis: Secondary | ICD-10-CM | POA: Diagnosis not present

## 2021-03-29 DIAGNOSIS — G8929 Other chronic pain: Secondary | ICD-10-CM | POA: Diagnosis not present

## 2021-03-29 DIAGNOSIS — I10 Essential (primary) hypertension: Secondary | ICD-10-CM | POA: Diagnosis not present

## 2021-03-29 DIAGNOSIS — F17210 Nicotine dependence, cigarettes, uncomplicated: Secondary | ICD-10-CM | POA: Diagnosis not present

## 2021-03-29 DIAGNOSIS — I429 Cardiomyopathy, unspecified: Secondary | ICD-10-CM | POA: Diagnosis not present

## 2021-03-29 DIAGNOSIS — S12120K Other displaced dens fracture, subsequent encounter for fracture with nonunion: Secondary | ICD-10-CM | POA: Diagnosis not present

## 2021-03-29 DIAGNOSIS — Z7982 Long term (current) use of aspirin: Secondary | ICD-10-CM | POA: Diagnosis not present

## 2021-03-29 DIAGNOSIS — J441 Chronic obstructive pulmonary disease with (acute) exacerbation: Secondary | ICD-10-CM | POA: Diagnosis not present

## 2021-03-29 DIAGNOSIS — F329 Major depressive disorder, single episode, unspecified: Secondary | ICD-10-CM | POA: Diagnosis not present

## 2021-03-29 DIAGNOSIS — Z7951 Long term (current) use of inhaled steroids: Secondary | ICD-10-CM | POA: Diagnosis not present

## 2021-03-29 DIAGNOSIS — I214 Non-ST elevation (NSTEMI) myocardial infarction: Secondary | ICD-10-CM | POA: Diagnosis not present

## 2021-03-29 DIAGNOSIS — I2510 Atherosclerotic heart disease of native coronary artery without angina pectoris: Secondary | ICD-10-CM | POA: Diagnosis not present

## 2021-03-29 DIAGNOSIS — G629 Polyneuropathy, unspecified: Secondary | ICD-10-CM | POA: Diagnosis not present

## 2021-03-29 DIAGNOSIS — E785 Hyperlipidemia, unspecified: Secondary | ICD-10-CM | POA: Diagnosis not present

## 2021-03-30 ENCOUNTER — Encounter: Payer: Self-pay | Admitting: Family Medicine

## 2021-03-30 ENCOUNTER — Ambulatory Visit (INDEPENDENT_AMBULATORY_CARE_PROVIDER_SITE_OTHER): Payer: Medicare Other | Admitting: Family Medicine

## 2021-03-30 ENCOUNTER — Other Ambulatory Visit: Payer: Self-pay

## 2021-03-30 VITALS — BP 122/80 | HR 87 | Temp 97.5°F | Resp 16 | Ht 60.0 in | Wt 135.3 lb

## 2021-03-30 DIAGNOSIS — J441 Chronic obstructive pulmonary disease with (acute) exacerbation: Secondary | ICD-10-CM

## 2021-03-30 DIAGNOSIS — I2584 Coronary atherosclerosis due to calcified coronary lesion: Secondary | ICD-10-CM

## 2021-03-30 DIAGNOSIS — F3342 Major depressive disorder, recurrent, in full remission: Secondary | ICD-10-CM

## 2021-03-30 DIAGNOSIS — J432 Centrilobular emphysema: Secondary | ICD-10-CM

## 2021-03-30 DIAGNOSIS — I2510 Atherosclerotic heart disease of native coronary artery without angina pectoris: Secondary | ICD-10-CM

## 2021-03-30 MED ORDER — PREDNISONE 20 MG PO TABS: 40.0000 mg | ORAL_TABLET | Freq: Every day | ORAL | 0 refills | Status: AC

## 2021-03-30 MED ORDER — ALBUTEROL SULFATE HFA 108 (90 BASE) MCG/ACT IN AERS: 1.0000 | INHALATION_SPRAY | Freq: Four times a day (QID) | RESPIRATORY_TRACT | 2 refills | Status: DC | PRN

## 2021-03-30 NOTE — Assessment & Plan Note (Signed)
Improved with increase in celexa, no changes today. F/u in 3 months.

## 2021-03-30 NOTE — Assessment & Plan Note (Signed)
With SOB with exertion and wheezing on exam. Rx steroid burst, continue prn duonebs, trelegy. Given multiple exacerbations in last few months and on triple therapy, refer to pulm.

## 2021-03-30 NOTE — Patient Instructions (Signed)
It was great to see you!  Our plans for today:  - Take the steroids for your breathing. Continue to take the nebulizer as needed and trelegy every day.  - We are referring you to Pulmonology. Let us know if you don't hear about an appointment. - No changes to your other medications.  - Come back in 3 months.   Take care and seek immediate care sooner if you develop any concerns.   Dr. Ky Barban

## 2021-03-30 NOTE — Progress Notes (Signed)
   SUBJECTIVE:   CHIEF COMPLAINT / HPI:   Anxiety/Depression - Medications: citalopram 30mg , xanax 0.25mg  BID prn - Taking: hasn't taken any xanax since last appointment - chest tightness and anxiety improved - Counseling: no - Symptoms: none. Is having some trouble sleeping but attributes to her neck pain, working with PT.  - Current stressors: scare of recent hospitalization - has good support with family - has had 1-2 cigarettes since last appt. Not using patches. No cravings.  Depression screen [facility] 2/9 03/30/2021 03/08/2021 08/12/2020  Decreased Interest 0 1 1  Down, Depressed, Hopeless 0 0 0  PHQ - 2 Score 0 1 1  Altered sleeping 0 1 0  Tired, decreased energy 0 1 1  Change in appetite 0 0 0  Feeling bad or failure about yourself  0 0 0  Trouble concentrating 0 1 0  Moving slowly or fidgety/restless 0 0 0  Suicidal thoughts 0 0 0  PHQ-9 Score 0 4 2  Difficult doing work/chores Not difficult at all Not difficult at all -  Some recent data might be hidden   GAD 7 : Generalized Anxiety Score 03/30/2021 03/08/2021 01/01/2018 11/22/2017  Nervous, Anxious, on Edge 0 1 0 0  Control/stop worrying 0 1 0 1  Worry too much - different things 0 0 0 0  Trouble relaxing 0 0 0 2  Restless 0 0 0 0  Easily annoyed or irritable 0 1 0 1  Afraid - awful might happen 0 0 0 0  Total GAD 7 Score 0 3 0 4  Anxiety Difficulty Not difficult at all Somewhat difficult Not difficult at all -   Emphysema/COPD - Medications: trelegy, duoneb prn, albuterol prn - Compliance: good, using duoneb 2-3x per day - no chest tightness, cough - did have some SOB this morning and notices after PT exercises.  - nurse stated she heard wheezes when listening to her lungs   OBJECTIVE:   BP 122/80   Pulse 87   Temp (!) 97.5 F (36.4 C) (Oral)   Resp 16   Ht 5' (1.524 m)   Wt 135 lb 4.8 oz (61.4 kg)   SpO2 96%   BMI 26.42 kg/m   Gen: chronically ill appearing, in NAD, in wheelchair Card: RRR Lungs: CTAB,  end expiratory wheezing throughout, no crackles. Comfortable WOB on RA. Speaks in full sentences. Ext: WWP, no edema   ASSESSMENT/PLAN:   COPD exacerbation (HCC) With SOB with exertion and wheezing on exam. Rx steroid burst, continue prn duonebs, trelegy. Given multiple exacerbations in last few months and on triple therapy, refer to pulm.  Recurrent major depressive disorder, in full remission ([place]) Improved with increase in celexa, no changes today. F/u in 3 months.     Myles Gip, DO

## 2021-03-31 DIAGNOSIS — I214 Non-ST elevation (NSTEMI) myocardial infarction: Secondary | ICD-10-CM | POA: Diagnosis not present

## 2021-03-31 DIAGNOSIS — I2510 Atherosclerotic heart disease of native coronary artery without angina pectoris: Secondary | ICD-10-CM | POA: Diagnosis not present

## 2021-03-31 DIAGNOSIS — J441 Chronic obstructive pulmonary disease with (acute) exacerbation: Secondary | ICD-10-CM | POA: Diagnosis not present

## 2021-03-31 DIAGNOSIS — I429 Cardiomyopathy, unspecified: Secondary | ICD-10-CM | POA: Diagnosis not present

## 2021-03-31 DIAGNOSIS — G8929 Other chronic pain: Secondary | ICD-10-CM | POA: Diagnosis not present

## 2021-03-31 DIAGNOSIS — I10 Essential (primary) hypertension: Secondary | ICD-10-CM | POA: Diagnosis not present

## 2021-04-01 ENCOUNTER — Other Ambulatory Visit: Payer: Self-pay

## 2021-04-01 ENCOUNTER — Telehealth: Payer: Self-pay | Admitting: Family Medicine

## 2021-04-01 NOTE — Telephone Encounter (Signed)
Appt scheduled with Dr Ancil Boozer for 10.28.2022

## 2021-04-01 NOTE — Telephone Encounter (Signed)
Pt called and is requesting to have a prescription for Tramadol sent in for her. She states that she is just needing to have it for two weeks until she can get through with PT. Please advise.      [facility] - [place], [place] - [address]  [address] [place] [address]  Phone: [phone] Fax: [phone]  Hours: Not open 24 hours

## 2021-04-02 ENCOUNTER — Other Ambulatory Visit: Payer: Self-pay

## 2021-04-02 ENCOUNTER — Ambulatory Visit (INDEPENDENT_AMBULATORY_CARE_PROVIDER_SITE_OTHER): Payer: Medicare Other | Admitting: Family Medicine

## 2021-04-02 ENCOUNTER — Encounter: Payer: Self-pay | Admitting: Family Medicine

## 2021-04-02 VITALS — BP 122/76 | HR 77 | Temp 98.0°F | Resp 16 | Ht 60.0 in | Wt 133.6 lb

## 2021-04-02 DIAGNOSIS — I2510 Atherosclerotic heart disease of native coronary artery without angina pectoris: Secondary | ICD-10-CM

## 2021-04-02 DIAGNOSIS — J441 Chronic obstructive pulmonary disease with (acute) exacerbation: Secondary | ICD-10-CM | POA: Diagnosis not present

## 2021-04-02 DIAGNOSIS — G8929 Other chronic pain: Secondary | ICD-10-CM

## 2021-04-02 DIAGNOSIS — F1190 Opioid use, unspecified, uncomplicated: Secondary | ICD-10-CM

## 2021-04-02 DIAGNOSIS — D692 Other nonthrombocytopenic purpura: Secondary | ICD-10-CM | POA: Diagnosis not present

## 2021-04-02 DIAGNOSIS — M542 Cervicalgia: Secondary | ICD-10-CM | POA: Diagnosis not present

## 2021-04-02 DIAGNOSIS — I2584 Coronary atherosclerosis due to calcified coronary lesion: Secondary | ICD-10-CM

## 2021-04-02 MED ORDER — TRAMADOL HCL 50 MG PO TABS: 50.0000 mg | ORAL_TABLET | Freq: Two times a day (BID) | ORAL | 0 refills | Status: DC

## 2021-04-02 MED ORDER — NALOXONE HCL 0.4 MG/ML IJ SOLN: INTRAMUSCULAR | 0 refills | Status: DC

## 2021-04-02 MED ORDER — TRAMADOL HCL 50 MG PO TABS: 50.0000 mg | ORAL_TABLET | Freq: Two times a day (BID) | ORAL | 1 refills | Status: AC

## 2021-04-02 NOTE — Progress Notes (Signed)
Name: Terri Burns   MRN: [id number]    DOB: 1950-10-15   Date:04/02/2021       Progress Note  Subjective  Chief Complaint  Chief Complaint  Patient presents with   Medication Refill    tramadol    HPI  Chronic neck pain: she has been on disability since 2007,   had x-ray and MRI done, she has DDD.  She fell March 2020 and fracture her c-spine at C2 level, she went to [facility] but she fell down again a few days later and had to be admitted because of severe hypotension , hypoxia and had COPD exacerbation. She continues to have constant  neck pain, she was able to wean self off  neck colar in 2022 , she was on Celebrex and was doing well, however had  to stop all NSAID's due to recent MI and is now in significant pain Pain is described as constant dull and aching , sometimes shoots up to the occipital area and at times it is severe. Right now her pain s 5/10, but at the end of the day is worse She was taking Tramadol upon discharge from [facility] after MI and it helped.   Senile purpura: taking Brilinta since 02/2021 when she had a NSTEMI, reassurance given   COPD: recently seen by Dr. Ky Barban and is now taking prednisone , SOB is better, mild cough but not productive.   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   Chronic neck pain 01/01/2018   Benign essential HTN 01/01/2018   Senile purpura ([place]) 01/01/2018   Hyperglycemia 09/27/2016   Tobacco abuse 08/20/2015   Allergic rhinitis, seasonal 05/12/2015   Emphysema lung ([place]) 05/12/2015   Degeneration of intervertebral disc of cervical region 05/12/2015   GERD (gastroesophageal reflux disease) 05/12/2015   Major depression in remission ([place]) 05/12/2015    Past Surgical History:   Procedure Laterality Date   CORONARY STENT INTERVENTION N/A 02/22/2021   Procedure: CORONARY STENT INTERVENTION;  Surgeon: Wellington Hampshire, MD;  Location: [facility];  Service: Cardiovascular;  Laterality: N/A;   LEFT HEART CATH AND CORONARY ANGIOGRAPHY N/A 02/22/2021   Procedure: LEFT HEART CATH AND CORONARY ANGIOGRAPHY;  Surgeon: Wellington Hampshire, MD;  Location: [facility];  Service: Cardiovascular;  Laterality: N/A;   NECK SURGERY N/A 10/2003   SHOULDER SURGERY Left 2004    Family History  Problem Relation Age of Onset   Hypertension Mother    Stroke Mother    Heart disease Father    Diabetes Father    Heart disease Daughter    Pulmonary embolism Daughter    Hypertension Son     Social History   Tobacco Use   Smoking status: Former    Packs/day: 0.25    Years: 45.00    Pack years: 11.25    Types: Cigarettes    Start date: 12/10/1973    Quit date: 03/01/2021    Years since quitting: 0.0   Smokeless tobacco: Never   Tobacco comments:    4 cigarettes daily   Substance Use Topics   Alcohol use: Yes    Alcohol/week: 0.0 standard drinks    Comment: occasionally     Current Outpatient Medications:    albuterol (VENTOLIN HFA) 108 (  90 Base) MCG/ACT inhaler, Inhale 1-2 puffs into the lungs every 6 (six) hours as needed for wheezing or shortness of breath., Disp: 1 each, Rfl: 2   ALPRAZolam (XANAX) 0.25 MG tablet, Take 1 tablet (0.25 mg total) by mouth 2 (two) times daily as needed for anxiety., Disp: 30 tablet, Rfl: 0   aspirin 81 MG EC tablet, TAKE 1 TABLET BY MOUTH EVERY DAY, Disp: 90 tablet, Rfl: 0   atorvastatin (LIPITOR) 80 MG tablet, Take 1 tablet (80 mg total) by mouth daily., Disp: 30 tablet, Rfl: 2   blood glucose meter kit and supplies KIT, Dispense based on patient and insurance preference. Use up to four times daily as directed., Disp: 1 each, Rfl: 0   citalopram (CELEXA) 20 MG tablet, Take 1.5 tablets (30 mg total) by mouth every evening.,  Disp: 135 tablet, Rfl: 0   fluticasone (FLONASE) 50 MCG/ACT nasal spray, Place 2 sprays into both nostrils daily., Disp: 48 mL, Rfl: 0   Fluticasone-Umeclidin-Vilant (TRELEGY ELLIPTA) 100-62.5-25 MCG/INH AEPB, Inhale 1 puff into the lungs daily., Disp: 60 each, Rfl: 3   ipratropium-albuterol (DUONEB) 0.5-2.5 (3) MG/3ML SOLN, Inhale 3 mLs into the lungs every 4 (four) hours as needed., Disp: 120 mL, Rfl: 5   isosorbide mononitrate (IMDUR) 30 MG 24 hr tablet, Take 0.5 tablets (15 mg total) by mouth daily., Disp: 30 tablet, Rfl: 2   loratadine (ALLERGY RELIEF) 10 MG tablet, Take 1 tablet (10 mg total) by mouth daily., Disp: 90 tablet, Rfl: 1   losartan (COZAAR) 100 MG tablet, Take 1 tablet (100 mg total) by mouth every evening., Disp: 90 tablet, Rfl: 1   metoprolol succinate (TOPROL-XL) 25 MG 24 hr tablet, Take 1 tablet (25 mg total) by mouth every evening., Disp: 90 tablet, Rfl: 1   pantoprazole (PROTONIX) 40 MG tablet, Take 1 tablet (40 mg total) by mouth every evening., Disp: 90 tablet, Rfl: 1   predniSONE (DELTASONE) 20 MG tablet, Take 2 tablets (40 mg total) by mouth daily with breakfast for 5 days., Disp: 10 tablet, Rfl: 0   ticagrelor (BRILINTA) 90 MG TABS tablet, Take 1 tablet (90 mg total) by mouth 2 (two) times daily., Disp: 60 tablet, Rfl: 5   traMADol (ULTRAM) 50 MG tablet, Take by mouth every 6 (six) hours as needed., Disp: , Rfl:   Allergies  Allergen Reactions   Levofloxacin Shortness Of Breath   Penicillins Hives    itching   Meloxicam     dizziness   Nsaids Other (See Comments)    Patient states she can tolerate up to three doses per day without incident    I personally reviewed active problem list, medication list, allergies, family history, social history with the patient/caregiver today.   ROS  Ten systems reviewed and is negative except as mentioned in HPI  SOB is slightly better, no coughing , denies chest pain   Objective  Vitals:   04/02/21 0738  BP: 122/76   Pulse: 77  Resp: 16  Temp: 98 F (36.7 C)  TempSrc: Oral  SpO2: 97%  Weight: 133 lb 9.6 oz (60.6 kg)  Height: 5' (1.524 m)    Body mass index is 26.09 kg/m.  Physical Exam  Constitutional: Patient appears well-developed and well-nourished.  No distress.  HEENT: head atraumatic, normocephalic, pupils equal and reactive to light, neck is stiff, decrease rom and leaning to the left  Cardiovascular: Normal rate, regular rhythm and normal heart sounds.  No murmur heard. No BLE edema. Pulmonary/Chest: Effort normal  but has bilateral wheezing and scattered rhonchi No respiratory distress. Abdominal: Soft.  There is no tenderness. Psychiatric: Patient has a normal mood and affect. behavior is normal. Judgment and thought content normal.  Skin: senile purpura    PHQ2/9: Depression screen [facility] 2/9 04/02/2021 03/30/2021 03/08/2021 08/12/2020 07/09/2020  Decreased Interest 0 0 1 1 0  Down, Depressed, Hopeless 0 0 0 0 0  PHQ - 2 Score 0 0 1 1 0  Altered sleeping 1 0 1 0 -  Tired, decreased energy 1 0 1 1 -  Change in appetite 1 0 0 0 -  Feeling bad or failure about yourself  0 0 0 0 -  Trouble concentrating 0 0 1 0 -  Moving slowly or fidgety/restless 0 0 0 0 -  Suicidal thoughts 0 0 0 0 -  PHQ-9 Score 3 0 4 2 -  Difficult doing work/chores Not difficult at all Not difficult at all Not difficult at all - -  Some recent data might be hidden    phq 9 is negative   Fall Risk: Fall Risk  04/02/2021 03/30/2021 08/12/2020 07/09/2020 02/21/2020  Falls in the past year? 0 0 0 0 0  Number falls in past yr: - 0 0 0 0  Injury with Fall? - 0 0 0 0  Comment - - - - -  Risk for fall due to : - No Fall Risks - Impaired balance/gait -  Risk for fall due to: Comment - - - - -  Follow up Falls prevention discussed Falls prevention discussed - Falls prevention discussed -      Functional Status Survey: Is the patient deaf or have difficulty hearing?: No Does the patient have difficulty seeing, even  when wearing glasses/contacts?: Yes Does the patient have difficulty concentrating, remembering, or making decisions?: No Does the patient have difficulty walking or climbing stairs?: Yes Does the patient have difficulty dressing or bathing?: No Does the patient have difficulty doing errands alone such as visiting a doctor's office or shopping?: Yes    Assessment & Plan  1. Chronic neck pain  - traMADol (ULTRAM) 50 MG tablet; Take 1 tablet (50 mg total) by mouth 2 (two) times daily.  Dispense: 60 tablet; Refill: 0 - traMADol (ULTRAM) 50 MG tablet; Take 1 tablet (50 mg total) by mouth 2 (two) times daily for 5 days.  Dispense: 60 tablet; Refill: 1 - naloxone (NARCAN) 0.4 MG/ML injection; Use prn overdose  Dispense: 1 mL; Refill: 0  2. Senile purpura ([place])  Reassurance given  3. COPD exacerbation ([place])  Wheezing finishing prednisone , evaluation by pulmonologist is pending  4. Chronic narcotic use  - naloxone (NARCAN) 0.4 MG/ML injection; Use prn overdose  Dispense: 1 mL; Refill: 0   Discussed pain contract

## 2021-04-06 ENCOUNTER — Telehealth: Payer: Self-pay

## 2021-04-06 DIAGNOSIS — I429 Cardiomyopathy, unspecified: Secondary | ICD-10-CM | POA: Diagnosis not present

## 2021-04-06 DIAGNOSIS — I2510 Atherosclerotic heart disease of native coronary artery without angina pectoris: Secondary | ICD-10-CM | POA: Diagnosis not present

## 2021-04-06 DIAGNOSIS — G8929 Other chronic pain: Secondary | ICD-10-CM | POA: Diagnosis not present

## 2021-04-06 DIAGNOSIS — I214 Non-ST elevation (NSTEMI) myocardial infarction: Secondary | ICD-10-CM | POA: Diagnosis not present

## 2021-04-06 DIAGNOSIS — J441 Chronic obstructive pulmonary disease with (acute) exacerbation: Secondary | ICD-10-CM | POA: Diagnosis not present

## 2021-04-06 DIAGNOSIS — I10 Essential (primary) hypertension: Secondary | ICD-10-CM | POA: Diagnosis not present

## 2021-04-06 NOTE — Progress Notes (Signed)
    Chronic Care Management Pharmacy Assistant   Name: Terri Burns  MRN: [id number] DOB: [birth year]  Patient called to be reminded of her telephone appointment with [name], CPP on 04/07/2021 @ 1100  No answer, left message of appointment date, time and type of appointment (either telephone or in person). Left message to have all medications, supplements, blood pressure and/or blood sugar logs available during appointment and to return call if need to reschedule.  Star Rating Drug: Losartan 100 mg last filled on 03/15/2021 for a 30-Day supply with [facility] Atorvastatin 80 mg last filled on 03/26/2021 for a 26-Day supply with [facility]  Any gaps in medications fill history? No  Care Gaps: Colonoscopy Mammogram Dexa Scan   Lynann Bologna, CPA/CMA Clinical Pharmacist Assistant Phone: [phone]

## 2021-04-07 ENCOUNTER — Telehealth: Payer: Self-pay

## 2021-04-07 DIAGNOSIS — I2510 Atherosclerotic heart disease of native coronary artery without angina pectoris: Secondary | ICD-10-CM | POA: Diagnosis not present

## 2021-04-07 DIAGNOSIS — I10 Essential (primary) hypertension: Secondary | ICD-10-CM | POA: Diagnosis not present

## 2021-04-07 DIAGNOSIS — I214 Non-ST elevation (NSTEMI) myocardial infarction: Secondary | ICD-10-CM | POA: Diagnosis not present

## 2021-04-07 DIAGNOSIS — G8929 Other chronic pain: Secondary | ICD-10-CM | POA: Diagnosis not present

## 2021-04-07 DIAGNOSIS — J441 Chronic obstructive pulmonary disease with (acute) exacerbation: Secondary | ICD-10-CM | POA: Diagnosis not present

## 2021-04-07 DIAGNOSIS — I429 Cardiomyopathy, unspecified: Secondary | ICD-10-CM | POA: Diagnosis not present

## 2021-04-07 NOTE — Progress Notes (Incomplete)
Chronic Care Management Pharmacy Note  04/07/2021 Name:  Terri Burns MRN:  [id number] DOB:  07/29/1950  Summary: Patient presents for CCM follow-up. She is doing well overall. She continues to smoke, but is down to 2-3 cigarettes daily. She is adherent to her inhalers and nebulizer solution.   Recommendations/Changes made from today's visit: Recommend nicotine gum 2 mg for breakthrough cravings  Plan: CPP follow-up in 3 months   Subjective: Terri Burns is an 70 y.o. year old female who is a primary patient of Steele Sizer, MD.  The CCM team was consulted for assistance with disease management and care coordination needs.    Engaged with patient by telephone for follow up visit in response to provider referral for pharmacy case management and/or care coordination services.   Consent to Services:  The patient was given information about Chronic Care Management services, agreed to services, and gave verbal consent prior to initiation of services.  Please see initial visit note for detailed documentation.   Patient Care Team: Steele Sizer, MD as PCP - General (Family Medicine) Nadene Rubins, DO as Referring Physician (Physical Medicine and Rehabilitation) Germaine Pomfret, [facility] as Pharmacist (Pharmacist)  Recent office visits:  04/02/21: Patient presented to Dr. Ancil Boozer for neck pain. Tramadol + naloxone started.  03/30/21: Patient presetned to Dr. Ky Barban for follow-up. Prednisone burst for chest wheezing.  08/12/20: Patient presented to Dr. Ancil Boozer for follow-up. BP 144/92. Losartan increased to 100 mg daily. Patient started on Lyric 50 mg TID.   Recent consult visits: 03/11/21: Patient presented to Marrianne Mood, PA-C for follow-up.  08/03/20: Patient presented to Dr. Nehemiah Massed (Dermatology)   Hospital visits: Medication Reconciliation was completed by comparing discharge summary, patients EMR and Pharmacy list, and upon discussion with patient.   Admitted to  the hospital on 09/13/2020 due to COPD exacerbation. Discharge date was 09/15/2020. Discharged from [place]?Medications Started at [facility] Discharge:?? -started Amlodipine 5 mg due to Hypertension elevated -Started Nicotine 21 mg patch Medication Changes at Hospital Discharge: -Changed none   Medications Discontinued at Hospital Discharge: -Stopped Celecoxib 200 mg    Medications that remain the same after Hospital Discharge:??  -All other medications will remain the same.  Objective:  Lab Results  Component Value Date   CREATININE 0.84 03/11/2021   BUN 13 03/11/2021   GFRNONAA >60 02/24/2021   GFRAA 104 02/21/2020   NA 139 03/11/2021   K 5.1 03/11/2021   CALCIUM 9.0 03/11/2021   CO2 19 (L) 03/11/2021   GLUCOSE 76 03/11/2021    No results found for: HGBA1C, FRUCTOSAMINE, GFR, MICROALBUR  Last diabetic Eye exam: No results found for: HMDIABEYEEXA  Last diabetic Foot exam: No results found for: HMDIABFOOTEX   Lab Results  Component Value Date   CHOL 173 02/21/2020   HDL 101 02/21/2020   LDLCALC 61 02/21/2020   LDLDIRECT 45.5 02/25/2021   TRIG 39 02/21/2020   CHOLHDL 1.7 02/21/2020    Hepatic Function Latest Ref Rng & Units 02/19/2021 02/02/2021 02/21/2020  Total Protein 6.5 - 8.1 g/dL 6.8 7.8 6.9  Albumin 3.5 - 5.0 g/dL 3.7 4.2 -  AST 15 - 41 U/L 59(H) 41 22  ALT 0 - 44 U/L 52(H) 27 19  Alk Phosphatase 38 - 126 U/L 55 66 -  Total Bilirubin 0.3 - 1.2 mg/dL 0.4 0.3 0.5    No results found for: TSH, FREET4  CBC Latest Ref Rng & Units 03/11/2021 02/23/2021 02/22/2021  WBC 3.4 - 10.8 x10E3/uL 8.1 9.4 12.6(H)  Hemoglobin 11.1 - 15.9 g/dL 11.9 12.4 12.5  Hematocrit 34.0 - 46.6 % 35.6 37.8 38.4  Platelets 150 - 450 x10E3/uL 220 210 245    No results found for: VD25OH  Clinical ASCVD: Yes  The ASCVD Risk score (Arnett DK, et al., 2019) failed to calculate for the following reasons:   The patient has a prior MI or stroke diagnosis     Depression screen [facility] 2/9 04/02/2021 03/30/2021 03/08/2021  Decreased Interest 0 0 1  Down, Depressed, Hopeless 0 0 0  PHQ - 2 Score 0 0 1  Altered sleeping 1 0 1  Tired, decreased energy 1 0 1  Change in appetite 1 0 0  Feeling bad or failure about yourself  0 0 0  Trouble concentrating 0 0 1  Moving slowly or fidgety/restless 0 0 0  Suicidal thoughts 0 0 0  PHQ-9 Score 3 0 4  Difficult doing work/chores Not difficult at all Not difficult at all Not difficult at all  Some recent data might be hidden    Social History   Tobacco Use  Smoking Status Former   Packs/day: 0.25   Years: 45.00   Pack years: 11.25   Types: Cigarettes   Start date: 12/10/1973   Quit date: 03/01/2021   Years since quitting: 0.1  Smokeless Tobacco Never  Tobacco Comments   4 cigarettes daily    BP Readings from Last 3 Encounters:  04/02/21 122/76  03/30/21 122/80  03/11/21 118/70   Pulse Readings from Last 3 Encounters:  04/02/21 77  03/30/21 87  03/11/21 67   Wt Readings from Last 3 Encounters:  04/02/21 133 lb 9.6 oz (60.6 kg)  03/30/21 135 lb 4.8 oz (61.4 kg)  03/11/21 135 lb 8 oz (61.5 kg)   BMI Readings from Last 3 Encounters:  04/02/21 26.09 kg/m  03/30/21 26.42 kg/m  03/11/21 26.46 kg/m    Assessment/Interventions: Review of patient past medical history, allergies, medications, health status, including review of consultants reports, laboratory and other test data, was performed as part of comprehensive evaluation and provision of chronic care management services.   SDOH:  (Social Determinants of Health) assessments and interventions performed: Yes   SDOH Screenings   Alcohol Screen: Not on file  Depression (PHQ2-9): Low Risk    PHQ-2 Score: 3  Financial Resource Strain: Low Risk    Difficulty of Paying Living Expenses: Not hard at all  Food Insecurity: No Food Insecurity   Worried About Charity fundraiser in the Last Year: Never true   Ran Out of Food in the Last Year:  Never true  Housing: Low Risk    Last Housing Risk Score: 0  Physical Activity: Inactive   Days of Exercise per Week: 0 days   Minutes of Exercise per Session: 0 min  Social Connections: Moderately Integrated   Frequency of Communication with Friends and Family: More than three times a week   Frequency of Social Gatherings with Friends and Family: More than three times a week   Attends Religious Services: More than 4 times per year   Active Member of [employer] or Organizations: Yes   Attends Archivist Meetings: More than 4 times per year   Marital Status: Widowed  Stress: No Stress Concern Present   Feeling of Stress : Not at all  Tobacco Use: Medium Risk   Smoking Tobacco Use: Former   Smokeless Tobacco Use: Never   Passive Exposure:  Not on file  Transportation Needs: No Transportation Needs   Lack of Transportation (Medical): No   Lack of Transportation (Non-Medical): No    CCM Care Plan  Allergies  Allergen Reactions   Levofloxacin Shortness Of Breath   Penicillins Hives    itching   Meloxicam     dizziness   Nsaids Other (See Comments)    Patient states she can tolerate up to three doses per day without incident    Medications Reviewed Today     Reviewed by Royal Hawthorn, CMA (Certified Medical Assistant) on 04/02/21 at 0737  Med List Status: <None>   Medication Order Taking? Sig Documenting Provider Last Dose Status Informant  albuterol (VENTOLIN HFA) 108 (90 Base) MCG/ACT inhaler [id number]  Inhale 1-2 puffs into the lungs every 6 (six) hours as needed for wheezing or shortness of breath. Myles Gip, DO  Active   ALPRAZolam Duanne Moron) 0.25 MG tablet [id number]  Take 1 tablet (0.25 mg total) by mouth 2 (two) times daily as needed for anxiety. Ezekiel Slocumb, DO  Active   aspirin 81 MG EC tablet [id number]  TAKE 1 TABLET BY MOUTH EVERY DAY Sowles, Drue Stager, MD  Active Self  atorvastatin (LIPITOR) 80 MG tablet [id number]  Take 1 tablet (80 mg total) by  mouth daily. Nicole Kindred A, DO  Active   blood glucose meter kit and supplies KIT [id number]  Dispense based on patient and insurance preference. Use up to four times daily as directed. Ezekiel Slocumb, DO  Active   citalopram (CELEXA) 20 MG tablet [id number]  Take 1.5 tablets (30 mg total) by mouth every evening. Myles Gip, DO  Active   fluticasone (FLONASE) 50 MCG/ACT nasal spray [id number]  Place 2 sprays into both nostrils daily. Steele Sizer, MD  Active Self  Fluticasone-Umeclidin-Vilant (TRELEGY ELLIPTA) 100-62.5-25 MCG/INH AEPB [id number]  Inhale 1 puff into the lungs daily. Steele Sizer, MD  Active   ipratropium-albuterol (DUONEB) 0.5-2.5 (3) MG/3ML SOLN [id number]  Inhale 3 mLs into the lungs every 4 (four) hours as needed. Steele Sizer, MD  Active Self  isosorbide mononitrate (IMDUR) 30 MG 24 hr tablet [id number]  Take 0.5 tablets (15 mg total) by mouth daily. Nicole Kindred A, DO  Active   loratadine (ALLERGY RELIEF) 10 MG tablet [id number]  Take 1 tablet (10 mg total) by mouth daily. Steele Sizer, MD  Active   losartan (COZAAR) 100 MG tablet [id number]  Take 1 tablet (100 mg total) by mouth every evening. Steele Sizer, MD  Active   metoprolol succinate (TOPROL-XL) 25 MG 24 hr tablet [id number]  Take 1 tablet (25 mg total) by mouth every evening. Steele Sizer, MD  Active   pantoprazole (PROTONIX) 40 MG tablet [id number]  Take 1 tablet (40 mg total) by mouth every evening. Steele Sizer, MD  Active   predniSONE (DELTASONE) 20 MG tablet [id number]  Take 2 tablets (40 mg total) by mouth daily with breakfast for 5 days. Myles Gip, DO  Active   ticagrelor (BRILINTA) 90 MG TABS tablet [id number]  Take 1 tablet (90 mg total) by mouth 2 (two) times daily. Wellington Hampshire, MD  Active   traMADol Veatrice Bourbon) 50 MG tablet [id number]  Take by mouth every 6 (six) hours as needed. 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   Chronic neck pain 01/01/2018   Benign essential HTN 01/01/2018   Senile purpura ([place]) 01/01/2018   Hyperglycemia 09/27/2016   Tobacco abuse 08/20/2015   Allergic rhinitis, seasonal 05/12/2015   Emphysema lung ([place]) 05/12/2015   Degeneration of intervertebral disc of cervical region 05/12/2015   GERD (gastroesophageal reflux disease) 05/12/2015   Major depression in remission ([place]) 05/12/2015    Immunization History  Administered Date(s) Administered   Fluad Quad(high Dose 65+) 03/13/2019, 02/21/2020, 03/08/2021   Influenza, High Dose Seasonal PF 03/29/2017   Influenza, Seasonal, Injecte, Preservative Fre 03/26/2012   Influenza,inj,Quad PF,6+ Mos 03/18/2013, 05/12/2015, 05/09/2016   Moderna Sars-Covid-2 Vaccination 10/17/2019, 11/14/2019   Pneumococcal Conjugate-13 08/30/2018   Pneumococcal Polysaccharide-23 03/08/2010, 06/09/2013, 02/21/2020   Tdap 05/27/2013    Conditions to be addressed/monitored:  Hypertension, Hyperlipidemia, Coronary Artery Disease, GERD, COPD, Depression and Tobacco use  There are no care plans that you recently modified to display for this patient.    Medication Assistance: None required.  Patient affirms current coverage meets needs.  Patient's preferred pharmacy is:  [facility] - [place], [place] - [address] [place] [address] Phone: [phone] Fax: [phone]  [id number] [id number]: [phone]: [phone] [id number]: [phone]: [phone] Patient  decided to: Utilize [facility] for medication synchronization, packaging and delivery  Care Plan and Follow Up Patient Decision:  Patient agrees to Care Plan and Follow-up.  Plan: Telephone follow up appointment with care management team member scheduled for:  04/07/2021 at 11:00 AM  ADoristine [phone] Current Barriers:  Unable to maintain control of hypertension Unable to quit smoking  Pharmacist Clinical Goal(s):  Patient will achieve control of blood pressure as evidenced by BP less than 140/90 Quit smoking through collaboration with PharmD and provider.   Interventions: 1:1 collaboration with SSteele Sizer MD regarding development and update of comprehensive plan of care as evidenced by provider attestation and co-signature Inter-disciplinary care team collaboration (see longitudinal plan of care) Comprehensive medication review performed; medication list updated in electronic medical record  Hypertension (BP goal <140/90) -Not ideally controlled -Current treatment: Amlodipine 5 mg daily  Losartan 100 mg daily  Metoprolol XL 25 mg daily  -Medications previously tried: NA  -Current home readings: 1606-004Hsystolics -Denies hypotensive/hypertensive symptoms -Counseled to monitor BP at home weekly, document, and provide log at future appointments -Recommended to continue current medication  Hyperlipidemia: (LDL goal < 70) -Controlled -Current treatment: Atorvastatin 20 mg daily  -Current antiplatelet treatment: Aspirin 81 mg daily  -Medications previously tried: NA  -Recommended to continue current medication  COPD (Goal: control symptoms and prevent exacerbations) -Uncontrolled -Current treatment  Albuterol 1-2 puffs every 6 hours as needed Trelegy 1 puff daily Duoneb -Medications previously tried: NA  -Gold Grade: NA -Current COPD Classification:  D (high sx, >/=2 exacerbations/yr) -CAT  score: 12 (Apr 2022) -Exacerbations requiring treatment in last 6 months: Yes Hospitalized 09/13/20, prednisone burst 03/30/21 -Patient reports consistent use of maintenance inhaler -Frequency of rescue inhaler use: nebulizer 3x daily, Albuterol sporadically when out of the home.  -Counseled on Proper inhaler technique; -Recommended to continue current medication  Tobacco use (Goal Quit  smoking) -Uncontrolled -Previous quit attempts: NA -Current treatment  Nicotine 21 mg patch -Patient smokes After 30 minutes of waking -Patient triggers include: drinking coffee - Down to 2-3 cigarettes daily. She still struggles with cravings, although she has identified triggers than tend to lead her to smoking and is trying to develop strategies to manage her cravings. She did not pick up nicotine gum/lozenges as recommended. She also wants to know if she should stay on the 21 mg patch or go to the 14 mg patch.  -Recommend Nicotine Gum 2 mg as needed for breakthrough cravings -Continue Nicotine 21 mg patch until smoke free for 6 weeks.   Depression/Anxiety (Goal: Maintain stable mood) -Controlled -Current treatment: Citalopram 20 mg daily  -Medications previously tried/failed: NA -PHQ9: 2 -Could consider Wellbutrin SR trial to help with smoking cessation.  -Recommended to continue current medication   Patient Goals/Self-Care Activities Patient will:  - check blood pressure 2-3 times weekly, document, and provide at future appointments  Follow Up Plan: Telephone follow up appointment with care management team member scheduled for:  04/07/2021 at 11:00 AM

## 2021-04-08 ENCOUNTER — Telehealth: Payer: Self-pay

## 2021-04-08 ENCOUNTER — Ambulatory Visit: Payer: Medicare Other | Admitting: Family Medicine

## 2021-04-08 NOTE — Progress Notes (Signed)
Chronic Care Management Pharmacy Assistant   Name: Terri Burns  MRN: [id number] DOB: 07/21/1950  Reason for Encounter: Medication Review/Medication Coordination Call   Recent office visits: 04/02/2021 Steele Sizer, MD (PCP Office Visit) for Medication Refill- Started:  Naloxone HCl 0.4 mg prn, Changed: Tramadol 50 mg twice daily instead of q6h prn.   03/30/2021 Rory Percy, DO (PCP Office Visit) for Follow-up- Started: Prednisone 40 mg daily, Referral to Pulmonology placed, patient instructed to follow-up in 3 months.   Recent consult visits:  03/11/2021 Marrianne Mood, PA-C (Cardiology) for Hospital Cath Follow-up- No medication changes noted, lab order placed, VAS [place] Lower Extremity Venous (DVT), VAS [place] Lower Extremity Arterial Duplex, EKG 12-Lead and Echocardiogram Complete placed, Patient instructed to follow-up in 4 weeks.  Hospital visits:  None in previous 6 months  Medications: Outpatient Encounter Medications as of 04/08/2021  Medication Sig   albuterol (VENTOLIN HFA) 108 (90 Base) MCG/ACT inhaler Inhale 1-2 puffs into the lungs every 6 (six) hours as needed for wheezing or shortness of breath.   ALPRAZolam (XANAX) 0.25 MG tablet Take 1 tablet (0.25 mg total) by mouth 2 (two) times daily as needed for anxiety.   aspirin 81 MG EC tablet TAKE 1 TABLET BY MOUTH EVERY DAY   atorvastatin (LIPITOR) 80 MG tablet Take 1 tablet (80 mg total) by mouth daily.   blood glucose meter kit and supplies KIT Dispense based on patient and insurance preference. Use up to four times daily as directed.   citalopram (CELEXA) 20 MG tablet Take 1.5 tablets (30 mg total) by mouth every evening.   fluticasone (FLONASE) 50 MCG/ACT nasal spray Place 2 sprays into both nostrils daily.   Fluticasone-Umeclidin-Vilant (TRELEGY ELLIPTA) 100-62.5-25 MCG/INH AEPB Inhale 1 puff into the lungs daily.   ipratropium-albuterol (DUONEB) 0.5-2.5 (3) MG/3ML SOLN Inhale 3 mLs into the lungs every 4 (four)  hours as needed.   isosorbide mononitrate (IMDUR) 30 MG 24 hr tablet Take 0.5 tablets (15 mg total) by mouth daily.   loratadine (ALLERGY RELIEF) 10 MG tablet Take 1 tablet (10 mg total) by mouth daily.   losartan (COZAAR) 100 MG tablet Take 1 tablet (100 mg total) by mouth every evening.   metoprolol succinate (TOPROL-XL) 25 MG 24 hr tablet Take 1 tablet (25 mg total) by mouth every evening.   naloxone (NARCAN) 0.4 MG/ML injection Use prn overdose   pantoprazole (PROTONIX) 40 MG tablet Take 1 tablet (40 mg total) by mouth every evening.   ticagrelor (BRILINTA) 90 MG TABS tablet Take 1 tablet (90 mg total) by mouth 2 (two) times daily.   traMADol (ULTRAM) 50 MG tablet Take 1 tablet (50 mg total) by mouth 2 (two) times daily.   No facility-administered encounter medications on file as of 04/08/2021.   Care Gaps: Colonoscopy Mammogram Dexa Scan  Star Rating Drugs: Losartan 100 mg last filled on 03/15/2021 for a 30-Day supply with [facility] Atorvastatin 80 mg last filled on 03/26/2021 for a 26-Day supply with [facility]  BP Readings from Last 3 Encounters:  04/02/21 122/76  03/30/21 122/80  03/11/21 118/70    No results found for: HGBA1C   Patient obtains medications through Adherence Packaging  30 Days   Last adherence delivery included: Trelegy Aer 100-62.5-25  mcg Ellipta- 1 puff by mouth daily Loratadine 10 mg-take one tablet daily (Before breakfast) Citalopram 20 mg-take one and a half tablets by mouth every evening (Evening Meals) Metoprolol 25 mg-take 1 tablet daily- (Evening Meals) Pantoprazole 40 mg-  take one tablet- (Evening Meals) Losartan 100 mg-take 1 tablet by mouth every day- (Evening Meals)  Patient declined medications last month:  Amlodipine 5 mg one tablet daily before breakfast-Hospital Discontinued via hosptial Atorvastatin 20 mg- take one tablet daily before breakfast-Increased to 80 mg and she has enough to get her to the 24th Fluticasone  Nasal Spray (PRN) (Patient states patient receive a bottle from the hospital) Albuterol 108 mcg/Act (PRN) Aspirin 81 mg EC (OTC)  Patient is due for next adherence delivery on: 04/20/2021 (Tuesday) 1st Route.  Called patient and reviewed medications and coordinated delivery.  This delivery to include: Brilinta 90 mg 1 tablet twice daily (Breakfast, Bedtime) Atorvastatin 80 mg 1 tablet daily (Breakfast) Trelegy Ellipta 100-62.5-25 mcg inhale 1 puff into lungs daily Metoprolol Succinate 25 mg 1 tablet daily (Evening Meal) Losartan 100 mg 1 tablet daily (Evening Meal) Pantoprazole 40 mg 1 tablet daily (Evening Meal) Loratadine 10 mg 1 tablet daily (Breakfast) Citalopram 20 mg 1.5 tablets daily (Evening Meal) Isosorbide Mononitrate 30 mg Take 1/2 tablet daily (Breakfast) Narcan Injection use prn for overdose Flonase 50 mcg place 2 sprays into both nostrils daily  Patient declined the following medications:  Albuterol Inhaler 108 Inhale 1-2 puffs every six hours prn for wheezing or shortness of breath- PRN Alprazolam 0.25 mg 1 tablet twice daily prn for anxiety- PRN patient has ample supply  Patient needs refills for Nothing at this time.  Confirmed delivery date of 04/20/2021, advised patient that pharmacy will contact them the morning of delivery.  Patient has been rescheduled with CPP on 07/07/2021 @ [place], CPA/CMA Clinical Pharmacist Assistant Phone: [phone]

## 2021-04-09 DIAGNOSIS — I429 Cardiomyopathy, unspecified: Secondary | ICD-10-CM | POA: Diagnosis not present

## 2021-04-09 DIAGNOSIS — G8929 Other chronic pain: Secondary | ICD-10-CM | POA: Diagnosis not present

## 2021-04-09 DIAGNOSIS — I10 Essential (primary) hypertension: Secondary | ICD-10-CM | POA: Diagnosis not present

## 2021-04-09 DIAGNOSIS — I214 Non-ST elevation (NSTEMI) myocardial infarction: Secondary | ICD-10-CM | POA: Diagnosis not present

## 2021-04-09 DIAGNOSIS — J441 Chronic obstructive pulmonary disease with (acute) exacerbation: Secondary | ICD-10-CM | POA: Diagnosis not present

## 2021-04-09 DIAGNOSIS — I2510 Atherosclerotic heart disease of native coronary artery without angina pectoris: Secondary | ICD-10-CM | POA: Diagnosis not present

## 2021-04-12 DIAGNOSIS — I10 Essential (primary) hypertension: Secondary | ICD-10-CM | POA: Diagnosis not present

## 2021-04-12 DIAGNOSIS — I214 Non-ST elevation (NSTEMI) myocardial infarction: Secondary | ICD-10-CM | POA: Diagnosis not present

## 2021-04-12 DIAGNOSIS — I2510 Atherosclerotic heart disease of native coronary artery without angina pectoris: Secondary | ICD-10-CM | POA: Diagnosis not present

## 2021-04-12 DIAGNOSIS — J441 Chronic obstructive pulmonary disease with (acute) exacerbation: Secondary | ICD-10-CM | POA: Diagnosis not present

## 2021-04-12 DIAGNOSIS — I429 Cardiomyopathy, unspecified: Secondary | ICD-10-CM | POA: Diagnosis not present

## 2021-04-12 DIAGNOSIS — G8929 Other chronic pain: Secondary | ICD-10-CM | POA: Diagnosis not present

## 2021-04-13 ENCOUNTER — Other Ambulatory Visit: Payer: Self-pay

## 2021-04-13 ENCOUNTER — Other Ambulatory Visit: Payer: Self-pay | Admitting: Family Medicine

## 2021-04-13 ENCOUNTER — Ambulatory Visit (INDEPENDENT_AMBULATORY_CARE_PROVIDER_SITE_OTHER): Payer: Medicare Other

## 2021-04-13 ENCOUNTER — Ambulatory Visit (INDEPENDENT_AMBULATORY_CARE_PROVIDER_SITE_OTHER): Payer: Medicare Other | Admitting: Internal Medicine

## 2021-04-13 ENCOUNTER — Encounter: Payer: Self-pay | Admitting: Internal Medicine

## 2021-04-13 DIAGNOSIS — I2510 Atherosclerotic heart disease of native coronary artery without angina pectoris: Secondary | ICD-10-CM | POA: Diagnosis not present

## 2021-04-13 DIAGNOSIS — F17210 Nicotine dependence, cigarettes, uncomplicated: Secondary | ICD-10-CM | POA: Diagnosis not present

## 2021-04-13 DIAGNOSIS — I2584 Coronary atherosclerosis due to calcified coronary lesion: Secondary | ICD-10-CM | POA: Diagnosis not present

## 2021-04-13 DIAGNOSIS — J449 Chronic obstructive pulmonary disease, unspecified: Secondary | ICD-10-CM

## 2021-04-13 DIAGNOSIS — J3089 Other allergic rhinitis: Secondary | ICD-10-CM

## 2021-04-13 DIAGNOSIS — I5032 Chronic diastolic (congestive) heart failure: Secondary | ICD-10-CM

## 2021-04-13 LAB — ECHOCARDIOGRAM COMPLETE
AR max vel: 2.75 cm2
AV Area VTI: 2.83 cm2
AV Area mean vel: 2.73 cm2
AV Mean grad: 4 mmHg
AV Peak grad: 5.8 mmHg
Ao pk vel: 1.2 m/s
Area-P 1/2: 2.8 cm2
Calc EF: 69.5 %
Height: 60 in
S' Lateral: 2.1 cm
Single Plane A2C EF: 75 %
Single Plane A4C EF: 64.7 %
Weight: 2156.8 oz

## 2021-04-13 MED ORDER — ALBUTEROL SULFATE (2.5 MG/3ML) 0.083% IN NEBU: 2.5000 mg | INHALATION_SOLUTION | RESPIRATORY_TRACT | 12 refills | Status: DC | PRN

## 2021-04-13 NOTE — Patient Instructions (Addendum)
Plan A = Automatic = Always=   Trelegy 782 one click each am   Plan B = Backup (to supplement plan A, not to replace it) Only use your albuterol inhaler as a rescue medication to be used if you can't catch your breath by resting or doing a relaxed purse lip breathing pattern.  - The less you use it, the better it will work when you need it. - Ok to use the inhaler up to 2 puffs  every 4 hours if you must but call for appointment if use goes up over your usual need - Don't leave home without it !!  (think of it like the spare tire for your car)       Plan C = Crisis (instead of Plan B but only if Plan B stops working) - only use your albuterol nebulizer if you first try Plan B and it fails to help > ok to use the nebulizer up to every 4 hours but if start needing it regularly call for immediate appointment  Ok to try albuterol 15 min before an activity (on alternating days between the inhaler, the nebulizer and nothing )  that you know would usually make you short of breath and see if it makes any difference and if makes none then don't take albuterol after activity unless you can't catch your breath as this means it's the resting that helps, not the albuterol.       Make sure you check your oxygen saturation at your highest level of activity to be sure it stays over 90% and keep track of it at least once a week, more often if breathing getting worse, and let me know if losing ground.   Please schedule a follow up office visit in 6 weeks, call sooner if needed with pfts on return

## 2021-04-13 NOTE — Assessment & Plan Note (Addendum)
She says she has quit effective today  - advised I took an extended  opportunity with this patient to outline the consequences of continued cigarette use  in airway disorders based on all the data we have from the multiple national lung health studies (perfomed over decades at millions of dollars in cost)  indicating that maintenance of smoking cessation, not choice of inhalers or physicians, is the most important aspect of her care.     Each maintenance medication was reviewed in detail including emphasizing most importantly the difference between maintenance and prns and under what circumstances the prns are to be triggered using an action plan format where appropriate.  Total time for H and P, chart review, counseling, reviewing hfa/dpi device(s) , directly observing portions of ambulatory 02 saturation study/ and generating customized AVS unique to this new pt office visit / same day charting > 45 min

## 2021-04-13 NOTE — Progress Notes (Signed)
Terri Burns, female    DOB: [birth year]    MRN: [id number]   Brief patient profile:  22 yowf   says quit smoking as of 04/13/2021  referred to pulmonary clinic in [facility]  04/13/2021 by Dr Ancil Boozer   for  COPD maint on trelegy and neb maybe once a day and breathing worse since admit with MI   Admit date: 02/19/2021 Discharge date: 03/24/2021   Admitted From: home Disposition:  home     Discharge Diagnoses: Active Problems:   COPD with acute exacerbation (HCC)   Non-ST elevation (NSTEMI) myocardial infarction [facility])       Summary of HPI and Hospital Course:  Elderly wf with past medical history of COPD, hypertension, GERD, depression, hyperlipidemia, tobacco use disorder, history of C2 odontoid cervical fracture who presented to the ED with wheezing and shortness of breath.  She was noted to have hypoxia with O2 sat of 50% on room air on EMS arrival.  She was admitted for management of acute COPD exacerbation with acute respiratory failure with hypoxia and treated with steroids, bronchodilators and doxycycline.  She initially required BiPAP and since transitioned to nasal cannula oxygen which has since been weaned off.   During admission, patient developed chest pain and tightness.  Labs notable for rising troponin and nonspecific ST-T wave changes on EKG.  She was started on heparin infusion for acute non-STEMI.  Cardiology was consulted and took patient to the Cath Lab where severe proximal LAD stenosis (99%) and moderate RCA disease (60% stenosis) were noted.  Drug-eluting stent was placed to the proximal LAD.     Acute Non-STEMI -status post left heart cath and stent to proximal LAD.   No intervention to the moderate RCA lesion. --Continue aspirin, Brilinta, metoprolol and statin --Ambulate mobilize as tolerated   Patient is chest pain free and stable for discharge home with [facility] services and outpatient follow up.   Generalized weakness and dizziness on standing /orthostatic  -continue PT and OT.   Up out of bed mobilize as tolerated.   Home health PT arranged.   COPD with acute exacerbation -clinically improved, no wheezing on exam today.   Treated with steroids, bronchodilators, doxycycline.   Completed course steroids. No wheezing or signs of exacerbation at time of discharge.   Acute on Chronic diastolic CHF - improved with diuresis.  Euvolemic upon discharge.   Acute respiratory failure with hypoxia -resolved, weaned off oxygen to room air.   Initially required BiPAP.   Anxiety -continue as needed Xanax    History of Present Illness  04/13/2021  Pulmonary/ 1st office eval/ Terri Burns / [facility] on trelegy  Chief Complaint  Patient presents with   Consult    Asthma, Copd  SOB when bending over and when getting ready, 1-2 ciggs per day  Dyspnea:  mb and back is maybe 100 ft could do it s stopping before MI now stops half way since MI  Cough: better since cut back on then "quit" smoking  Sleep: bed is flat with a bunch of pillows no need for noct saba  SABA use: ventolin only when  Prednisone finished  10 d prior to ov  No obvious day to day or daytime variability or assoc excess/ purulent sputum or mucus plugs or hemoptysis or cp or chest tightness, subjective wheeze or overt sinus or hb symptoms.   Sleeping  without nocturnal  or early am exacerbation  of respiratory  c/o's or need for noct saba. Also  denies any obvious fluctuation of symptoms with weather or environmental changes or other aggravating or alleviating factors except as outlined above   No unusual exposure hx or h/o childhood pna/ asthma or knowledge of premature birth.  Current Allergies, Complete Past Medical History, Past Surgical History, Family History, and Social History were reviewed in [employer] record.  ROS  The following are not active complaints unless bolded Hoarseness, sore throat, dysphagia, dental problems, itching, sneezing,  nasal  congestion or discharge of excess mucus or purulent secretions, ear ache,   fever, chills, sweats, unintended wt loss or wt gain, classically pleuritic or exertional cp,  orthopnea pnd or arm/hand swelling  or leg swelling, presyncope, palpitations, abdominal pain, anorexia, nausea, vomiting, diarrhea  or change in bowel habits or change in bladder habits, change in stools or change in urine, dysuria, hematuria,  rash, arthralgias, visual complaints, headache, numbness, weakness or ataxia or problems with walking or coordination,  change in mood or  memory.           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 1 each 2   ALPRAZolam (XANAX) 0.25 MG tablet Take 1 tablet (0.25 mg total) by mouth 2 (two) times daily as needed for anxiety. 30 tablet 0   aspirin 81 MG EC tablet TAKE 1 TABLET BY MOUTH EVERY DAY 90 tablet 0   atorvastatin (LIPITOR) 80 MG tablet Take 1 tablet (80 mg total) by mouth daily. 30 tablet 2   blood glucose meter kit and supplies KIT Dispense based on patient and insurance preference. Use up to four times daily as directed. 1 each 0   citalopram (CELEXA) 20 MG tablet Take 1.5 tablets (30 mg total) by mouth every evening. 135 tablet 0   fluticasone (FLONASE) 50 MCG/ACT nasal spray Place 2 sprays into both nostrils daily. 48 mL 0   Fluticasone-Umeclidin-Vilant (TRELEGY ELLIPTA) 100-62.5-25 MCG/INH AEPB Inhale 1 puff into the lungs daily. 60 each 3    isosorbide mononitrate (IMDUR) 30 MG 24 hr tablet Take 0.5 tablets (15 mg total) by mouth daily. 30 tablet 2   loratadine (ALLERGY RELIEF) 10 MG tablet Take 1 tablet (10 mg total) by mouth daily. 90 tablet 1   losartan (COZAAR) 100 MG tablet Take 1 tablet (100 mg total) by mouth every evening. 90 tablet 1   metoprolol succinate (TOPROL-XL) 25 MG 24 hr tablet Take 1 tablet (25 mg total) by mouth every evening. 90 tablet 1   naloxone (NARCAN) 0.4 MG/ML injection Use prn overdose 1 mL 0   pantoprazole (PROTONIX) 40 MG tablet Take 1 tablet (40 mg total) by mouth every evening. 90 tablet 1   ticagrelor (BRILINTA) 90 MG TABS tablet Take 1 tablet (90 mg total) by mouth 2 (two) times daily. 60 tablet 5   traMADol (ULTRAM) 50 MG tablet Take 1 tablet (50 mg total) by mouth 2 (two) times daily. 60 tablet 0  ipratropium-albuterol (DUONEB) 0.5-2.5 (3) MG/3ML SOLN Inhale 3 mLs into the lungs every 4 (four) hours as needed. 120 mL 5   No facility-administered medications prior to visit.     Objective:     BP 122/82 (BP Location: Left Arm, Patient Position: Sitting, Cuff Size: Normal)   Pulse 73   Temp (!) 96.9 F (36.1 C) (Oral)   Ht 5' (1.524 m)   Wt 134 lb 12.8 oz (61.1 kg)   SpO2 98%   BMI 26.33 kg/m   SpO2: 98 % RA  Pleasant slow moving amb wf nad  HEENT : pt wearing mask not removed for exam due to covid -19 concerns.    NECK :  without JVD/Nodes/TM/ nl carotid upstrokes bilaterally   LUNGS: no acc muscle use,  Mod barrel  contour chest wall with bilateral  Distant bs s audible wheeze and  without cough on insp or exp maneuvers and mod  Hyperresonant  to  percussion bilaterally     CV:  RRR  no s3 or murmur or increase in P2, and no edema   ABD:  soft and nontender with pos mid insp Hoover's  in the supine position. No bruits or organomegaly appreciated, bowel sounds nl  MS:     ext warm without deformities, calf tenderness, cyanosis or clubbing No obvious joint restrictions    SKIN: warm and dry without lesions    NEURO:  alert, approp, nl sensorium with  no motor or cerebellar deficits apparent.        I personally reviewed images and agree with radiology impression as follows:   Chest LDSCT  09/26/19:  emphysema     Assessment   COPD GOLD ? / group D   Says quits smoking effective 04/13/2021  Chest LDSCT  09/26/19:  emphysema  - 04/13/2021  After extensive coaching inhaler device,  effectiveness =    50% hfa/ 80% dpi > continue trelegy and prn saba  - 04/13/2021 walked slowly x 350 ft and stopped due to sob with sats still    Group D in terms of symptom/risk and laba/lama/ICS  therefore appropriate rx at this point >>>  trelegy used approp and saba prn   Re SABA :  I spent extra time with pt today reviewing appropriate use of albuterol for prn use on exertion with the following points: 1) saba is for relief of sob that does not improve by walking a slower pace or resting but rather if the pt does not improve after trying this first. 2) If the pt is convinced, as many are, that saba helps recover from activity faster then it's easy to tell if this is the case by re-challenging : ie stop, take the inhaler, then p 5 minutes try the exact same activity (intensity of workload) that just caused the symptoms and see if they are substantially diminished or not after saba 3) if there is an activity that reproducibly causes the symptoms, try the saba 15 min before the activity on alternate days   If in fact the saba really does help, then fine to continue to use it prn but advised may need to look closer at the maintenance regimen being used to achieve better control of airways disease with exertion.         Cigarette smoker She says she has quit effective today  - advised I took an extended  opportunity with this patient to outline the consequences of continued cigarette use  in airway disorders based on  all the data we have from the multiple national lung health  studies (perfomed over decades at millions of dollars in cost)  indicating that maintenance of smoking cessation, not choice of inhalers or physicians, is the most important aspect of her care.     Each maintenance medication was reviewed in detail including emphasizing most importantly the difference between maintenance and prns and under what circumstances the prns are to be triggered using an action plan format where appropriate.  Total time for H and P, chart review, counseling, reviewing hfa/dpi device(s) , directly observing portions of ambulatory 02 saturation study/ and generating customized AVS unique to this new pt office visit / same day charting > 45 min                Christinia Gully, MD 04/13/2021

## 2021-04-13 NOTE — Assessment & Plan Note (Addendum)
Says quits smoking effective 04/13/2021  Chest LDSCT  09/26/19:  emphysema  - 04/13/2021  After extensive coaching inhaler device,  effectiveness =    50% hfa/ 80% dpi > continue trelegy and prn saba  - 04/13/2021 walked slowly x 350 ft and stopped due to sob with sats still    Group D in terms of symptom/risk and laba/lama/ICS  therefore appropriate rx at this point >>>  trelegy used approp and saba prn   Re SABA :  I spent extra time with pt today reviewing appropriate use of albuterol for prn use on exertion with the following points: 1) saba is for relief of sob that does not improve by walking a slower pace or resting but rather if the pt does not improve after trying this first. 2) If the pt is convinced, as many are, that saba helps recover from activity faster then it's easy to tell if this is the case by re-challenging : ie stop, take the inhaler, then p 5 minutes try the exact same activity (intensity of workload) that just caused the symptoms and see if they are substantially diminished or not after saba 3) if there is an activity that reproducibly causes the symptoms, try the saba 15 min before the activity on alternate days   If in fact the saba really does help, then fine to continue to use it prn but advised may need to look closer at the maintenance regimen being used to achieve better control of airways disease with exertion.

## 2021-04-15 ENCOUNTER — Other Ambulatory Visit (HOSPITAL_COMMUNITY): Payer: Self-pay

## 2021-04-15 ENCOUNTER — Telehealth: Payer: Self-pay | Admitting: Pharmacy Technician

## 2021-04-15 DIAGNOSIS — J441 Chronic obstructive pulmonary disease with (acute) exacerbation: Secondary | ICD-10-CM | POA: Diagnosis not present

## 2021-04-15 DIAGNOSIS — G8929 Other chronic pain: Secondary | ICD-10-CM | POA: Diagnosis not present

## 2021-04-15 DIAGNOSIS — I429 Cardiomyopathy, unspecified: Secondary | ICD-10-CM | POA: Diagnosis not present

## 2021-04-15 DIAGNOSIS — I2510 Atherosclerotic heart disease of native coronary artery without angina pectoris: Secondary | ICD-10-CM | POA: Diagnosis not present

## 2021-04-15 DIAGNOSIS — I10 Essential (primary) hypertension: Secondary | ICD-10-CM | POA: Diagnosis not present

## 2021-04-15 DIAGNOSIS — I214 Non-ST elevation (NSTEMI) myocardial infarction: Secondary | ICD-10-CM | POA: Diagnosis not present

## 2021-04-15 NOTE — Telephone Encounter (Signed)
Patient Advocate Encounter  Received notification from [place] that prior authorization for ALBUTEROL NEB SOL is required.   PA NOT SUBMITTED. PRODUCT/SERVICE NOT COVERED   [facility] will continue to follow  Luciano Cutter, CPhT Patient Advocate Phone: [phone] Fax:  [phone]

## 2021-04-15 NOTE — Telephone Encounter (Signed)
I have never heard of that - it's very cheap and probably could afflord to pay cash if she needs it but there is not cheaper alternative to try first.

## 2021-04-15 NOTE — Telephone Encounter (Signed)
Will route to MW to make him aware.

## 2021-04-16 ENCOUNTER — Other Ambulatory Visit: Payer: Self-pay

## 2021-04-16 ENCOUNTER — Ambulatory Visit (INDEPENDENT_AMBULATORY_CARE_PROVIDER_SITE_OTHER): Payer: Medicare Other | Admitting: Medical

## 2021-04-16 ENCOUNTER — Encounter: Payer: Self-pay | Admitting: Medical

## 2021-04-16 VITALS — BP 130/78 | HR 72 | Ht 60.0 in | Wt 134.0 lb

## 2021-04-16 DIAGNOSIS — J449 Chronic obstructive pulmonary disease, unspecified: Secondary | ICD-10-CM | POA: Diagnosis not present

## 2021-04-16 DIAGNOSIS — E785 Hyperlipidemia, unspecified: Secondary | ICD-10-CM | POA: Diagnosis not present

## 2021-04-16 DIAGNOSIS — R0609 Other forms of dyspnea: Secondary | ICD-10-CM

## 2021-04-16 DIAGNOSIS — I2510 Atherosclerotic heart disease of native coronary artery without angina pectoris: Secondary | ICD-10-CM | POA: Diagnosis not present

## 2021-04-16 DIAGNOSIS — I5022 Chronic systolic (congestive) heart failure: Secondary | ICD-10-CM

## 2021-04-16 DIAGNOSIS — Z79899 Other long term (current) drug therapy: Secondary | ICD-10-CM | POA: Diagnosis not present

## 2021-04-16 DIAGNOSIS — I10 Essential (primary) hypertension: Secondary | ICD-10-CM | POA: Diagnosis not present

## 2021-04-16 NOTE — Progress Notes (Signed)
Cardiology Office Note:    Date:  04/16/2021   ID:  Terri Burns, DOB January 13, 1951, MRN [id number]  PCP:  Steele Sizer, MD  [facility] Cardiologist:  None  [facility] Electrophysiologist:  None   Referring MD: Steele Sizer, MD   Chief Complaint: 4 week follow-up.   History of Present Illness:    Terri Burns is a 70 y.o. female with a hx of severe one-vessel CAD and LHC with PCI to LAD, 02/2021 non-STEMI, HFpEF, COPD, current tobacco use, hypertension, hyperlipidemia who presents for 1 month f/u.   On 02/19/2021, she presented to the [facility] ED with shortness of breath.  She was admitted and subsequently had chest tightness 9/18 around 5 AM.  This chest tightness/pressure continued over the next 2 hours despite administration of painkillers and breathing treatments.  At 1 point, the chest tightness radiating to under her left arm.  She also recalled pain between her shoulder blades the previous night.  EKG showed Q waves in the inferior 2, 3, aVF and precordial lateral leads, as well as T wave inversion in upsloping ST changes.  High-sensitivity troponin was cycled and 308, though this lab was not cycled further to determine a peak in her troponin.  Recommendation was for further ischemic workup given RF for CAD and via LHC.   LHC 02/23/2021 showed severe one-vessel CAD with 99% thrombotic stenosis of the proximal LAD, which was not the culprit of her non-ST elevation myocardial infarction.  Mild to moderate left circumflex and RCA disease was also noted.  In addition, moderately to severely reduced LV SF with EF 25 to 30% and severe anterior/apical hypokinesis is noted.  She had severely elevated LVEDP at 40 mmHg.  Successful balloon angioplasty with aspiration thrombectomy and drug-eluting stent placement to the proximal LAD was performed.  Initially, the plan was to MACE intravascular lithotripsy given the calcifications; however, she developed chest pain with sluggish flow to the LAD  just before PCI was started.  Recommendations were to continue DAPT for at least 12 months with aggressive treatment of risk factors.  She was also given IV furosemide for her elevated volume status.  The remainder of her CAD was to be treated medically.     Echo, performed 6, showed EF 55 to 60%, G1 DD, mild to moderate aortic valve sclerosis without stenosis.     At discharge, it was noted that further titration of GDMT was limited by hypotension and bradycardia.  Repeat limited echo at follow-up was recommended due to inconsistency in patient EF, specifically catheterization showed EF 25 to 30% and echo EF 55 to 60%.  Amlodipine was held due to low BP. PT/OT consulted with pt report she was living alone and uses a walker at home.  Last seen 03/11/21 and was stable from a cardiac perspective.   Today, the patient reports persistent DOE. She has to be careful with activity. Has COPD and this contributes to breathing issues. Ovreall breathing is better. Terri Burns is not on lasix at baseline. Has been cutting back on salt and fluid intake, not over 64Oz a day. No LLE, orthopnea or pnd. No further palpitations. She was referred to cardiac rehab, but she doesn't drive. She has PT coming o the home to see the patient twice a week. Feels she is progressing with this. She is on Brilinta. She is seeing pulmonology next week.    Past Medical History:  Diagnosis Date   COPD (chronic obstructive pulmonary disease) ([place])    Depression  GERD (gastroesophageal reflux disease)    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    Hyperlipidemia    Hypertension    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    Past Surgical History:  Procedure Laterality Date   CORONARY STENT INTERVENTION N/A 02/22/2021   Procedure: CORONARY STENT INTERVENTION;  Surgeon: Wellington Hampshire, MD;  Location: [place] [facility];  Service: Cardiovascular;  Laterality:  N/A;   LEFT HEART CATH AND CORONARY ANGIOGRAPHY N/A 02/22/2021   Procedure: LEFT HEART CATH AND CORONARY ANGIOGRAPHY;  Surgeon: Wellington Hampshire, MD;  Location: [place] [facility];  Service: Cardiovascular;  Laterality: N/A;   NECK SURGERY N/A 10/2003   SHOULDER SURGERY Left 2004    Current Medications: Current Meds  Medication Sig   albuterol (PROVENTIL) (2.5 MG/3ML) 0.083% nebulizer solution Take 3 mLs (2.5 mg total) by nebulization every 4 (four) hours as needed for wheezing or shortness of breath.   albuterol (VENTOLIN HFA) 108 (90 Base) MCG/ACT inhaler Inhale 1-2 puffs into the lungs every 6 (six) hours as needed for wheezing or shortness of breath.   ALPRAZolam (XANAX) 0.25 MG tablet Take 1 tablet (0.25 mg total) by mouth 2 (two) times daily as needed for anxiety.   aspirin 81 MG EC tablet TAKE 1 TABLET BY MOUTH EVERY DAY   atorvastatin (LIPITOR) 80 MG tablet Take 1 tablet (80 mg total) by mouth daily.   blood glucose meter kit and supplies KIT Dispense based on patient and insurance preference. Use up to four times daily as directed.   citalopram (CELEXA) 20 MG tablet Take 1.5 tablets (30 mg total) by mouth every evening.   fluticasone (FLONASE) 50 MCG/ACT nasal spray PLACE TWO SPRAYS into BOTH nostrils DAILY   Fluticasone-Umeclidin-Vilant (TRELEGY ELLIPTA) 100-62.5-25 MCG/INH AEPB Inhale 1 puff into the lungs daily.   isosorbide mononitrate (IMDUR) 30 MG 24 hr tablet Take 0.5 tablets (15 mg total) by mouth daily.   loratadine (ALLERGY RELIEF) 10 MG tablet Take 1 tablet (10 mg total) by mouth daily.   losartan (COZAAR) 100 MG tablet Take 1 tablet (100 mg total) by mouth every evening.   metoprolol succinate (TOPROL-XL) 25 MG 24 hr tablet Take 1 tablet (25 mg total) by mouth every evening.   NARCAN 4 MG/0.1ML LIQD nasal spray kit 1 spray as needed. As needed in case of overdose   pantoprazole (PROTONIX) 40 MG tablet Take 1 tablet (40 mg total) by mouth every evening.   ticagrelor  (BRILINTA) 90 MG TABS tablet Take 1 tablet (90 mg total) by mouth 2 (two) times daily.   traMADol (ULTRAM) 50 MG tablet Take 1 tablet (50 mg total) by mouth 2 (two) times daily.   [DISCONTINUED] naloxone (NARCAN) 0.4 MG/ML injection Use prn overdose     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  Tobacco Use   Smoking status: Every Day    Packs/day: 2.00    Years: 50.00    Pack years: 100.00    Types: Cigarettes    Start date: 12/10/1973   Smokeless tobacco: Never  Tobacco comments:    1-2 ciggs per day - 04/13/2021  Vaping Use   Vaping Use: Never used  Substance and Sexual Activity   Alcohol use: Yes    Alcohol/week: 0.0 standard drinks    Comment: occasionally   Drug use: No   Sexual activity: Not Currently    Partners: Male  Other Topics Concern   Not on file  Social History Narrative   Lives in a senior citizen complex and goes out with her friends.   Her daughter died due to heart issues on 2018/10/19 at the age of 54.   Both sons live in town and are helping her out    Social Determinants of Radio broadcast assistant Strain: Low Risk    Difficulty of Paying Living Expenses: Not hard at all  Food Insecurity: No Food Insecurity   Worried About Charity fundraiser in the Last Year: Never true   Arboriculturist in the Last Year: Never true  Transportation Needs: No Transportation Needs   Lack of Transportation (Medical): No   Lack of Transportation (Non-Medical): No  Physical Activity: Inactive   Days of Exercise per Week: 0 days   Minutes of Exercise per Session: 0 min  Stress: No Stress Concern Present   Feeling of Stress : Not at all  Social  Connections: Moderately Integrated   Frequency of Communication with Friends and Family: More than three times a week   Frequency of Social Gatherings with Friends and Family: More than three times a week   Attends Religious Services: More than 4 times per year   Active Member of [employer] or Organizations: Yes   Attends Archivist Meetings: More than 4 times per year   Marital Status: Widowed     Family History: The patient's ]family history includes Diabetes in her father; Heart disease in her daughter and father; Hypertension in her mother and son; Pulmonary embolism in her daughter; Stroke in her mother.  ROS:   Please see the history of present illness.     All other systems reviewed and are negative.  EKGs/Labs/Other Studies Reviewed:    The following studies were reviewed today:  Cath Left Heart   Left Ventricle The left ventricle is mildly dilated. There is moderate to severe left ventricular systolic dysfunction. LV end diastolic pressure is severely elevated. The left ventricular ejection fraction is 25-35% by visual estimate. There are LV function abnormalities due to segmental dysfunction.    Coronary Diagrams   Diagnostic Dominance: Right Intervention    02/22/21   Prox RCA to Mid RCA lesion is 60% stenosed.   Ost LAD to Prox LAD lesion is 25% stenosed.   Ost Cx to Prox Cx lesion is 40% stenosed.   1st Mrg lesion is 30% stenosed.   Prox LAD lesion is 99% stenosed.   A drug-eluting stent was successfully placed using a STENT ONYX FRONTIER 3.0X18.   Post intervention, there is a 0% residual stenosis.   There is moderate to severe left ventricular systolic dysfunction.   LV end diastolic pressure is severely elevated.   The left ventricular ejection fraction is 25-35% by visual estimate.   1.  Severe one-vessel coronary artery disease with 99% thrombotic stenosis in the proximal LAD which seems to be the culprit for non-ST elevation myocardial infarction.   In addition, there is mild to moderate left circumflex and RCA disease. 2.  Moderately to severely reduced LV systolic function with an EF  of 25 to 30% with severe anterior/apical hypokinesis. 3.  Severely elevated left ventricular end-diastolic pressure at 40 mmHg. 4.  Successful balloon angioplasty, aspiration thrombectomy and drug-eluting stent placement to the proximal LAD.  The initial plan was to use intravascular lithotripsy given calcifications but the patient developed chest pain with sluggish flow in the LAD just before the PCI was started. Recommendations: Dual antiplatelet therapy for at least 12 months. Aggressive treatment of risk factors. The patient was given 1 dose of IV furosemide 20 mg during the case with good urine output.  Monitor respiratory status and consider additional diuresis as needed. Treat the rest of the coronary artery disease medically.   Echo 02/22/21  1. Left ventricular ejection fraction, by estimation, is 55 to 60%. The  left ventricle has normal function. Left ventricular endocardial border  not optimally defined to evaluate regional wall motion. Left ventricular  diastolic parameters are consistent  with Grade I diastolic dysfunction (impaired relaxation).   2. Right ventricular systolic function is normal. The right ventricular  size is normal. Tricuspid regurgitation signal is inadequate for assessing  PA pressure.   3. The mitral valve is normal in structure. No evidence of mitral valve  regurgitation. No evidence of mitral stenosis.   4. The aortic valve is normal in structure. Aortic valve regurgitation is  not visualized. Mild to moderate aortic valve sclerosis/calcification is  present, without any evidence of aortic stenosis.   5. The inferior vena cava is normal in size with greater than 50%  respiratory variability, suggesting right atrial pressure of 3 mmHg.      EKG:  EKG is not ordered today.   Recent Labs: 02/06/2021: Magnesium  2.3 02/19/2021: ALT 52 02/24/2021: B Natriuretic Peptide 76.8 03/11/2021: BUN 13; Creatinine, Ser 0.84; Hemoglobin 11.9; Platelets 220; Potassium 5.1; Sodium 139  Recent Lipid Panel    Component Value Date/Time   CHOL 173 02/21/2020 1453   TRIG 39 02/21/2020 1453   HDL 101 02/21/2020 1453   CHOLHDL 1.7 02/21/2020 1453   LDLCALC 61 02/21/2020 1453   LDLDIRECT 45.5 02/25/2021 0931      Physical Exam:    VS:  BP 130/78   Pulse 72   Ht 5' (1.524 m)   Wt 134 lb (60.8 kg)   SpO2 97%   BMI 26.17 kg/m     Wt Readings from Last 3 Encounters:  04/16/21 134 lb (60.8 kg)  04/13/21 134 lb 12.8 oz (61.1 kg)  04/02/21 133 lb 9.6 oz (60.6 kg)     GEN:  Well nourished, well developed in no acute distress HEENT: Normal NECK: No JVD; No carotid bruits LYMPHATICS: No lymphadenopathy CARDIAC: RRR, no murmurs, rubs, gallops RESPIRATORY:  Clear to auscultation without rales, wheezing or rhonchi  ABDOMEN: Soft, non-tender, non-distended MUSCULOSKELETAL:  No edema; No deformity  SKIN: Warm and dry NEUROLOGIC:  Alert and oriented x 3 PSYCHIATRIC:  Normal affect   ASSESSMENT:    1. Medication management   2. DOE (dyspnea on exertion)   3. Chronic systolic heart failure ([place])   4. Chronic obstructive pulmonary disease, unspecified COPD type ([place])   5. Coronary artery disease involving native coronary artery of native heart without angina pectoris   6. Hyperlipidemia LDL goal <70   7. Essential hypertension    PLAN:    In order of problems listed above:  DOE CAD s/p PCI to pLAD Denies chest pain, has DOE that seems to be improving. She is doing PT at home and feels this  is helping. Will check a BNP today. It is possible that SOB is from [place]. Also she is prior smoker with COPD. Increase activity as tolerated. Continue ASA, statin, Brilinta, BB. Reassess symptoms in 2 months.   HFpEF Repeat echo 04/13/21 showed improved EF, EF 55-60%, G1DD, mild to mod MR. She is euvolemic on exam.  Given DOE check BNP, although low suspicion of volume overload. Continue Toprol and Losartan. CHF education encouraged.   HTN BP good today, continue Losartan, Imdur, and Toprol.   LLE Resolved with lifestyle changes.   Palpitations No further palpitations. No plan for heart monitor at this time.   COPD Prio tobacco use Quit smoking in September, might have occasional cigarette. She was congratulated.    Disposition: Follow up in 2 month(s) with MD/APP    Signed, Dvora Buitron Ninfa Meeker, PA-C  04/16/2021 4:35 PM    [facility]

## 2021-04-16 NOTE — Patient Instructions (Signed)
Medication Instructions:  Please continue your current medications *If you need a refill on your cardiac medications before your next appointment, please call your pharmacy*  Lab Work: BNP LABS WILL APPEAR ON MYCHART, ABNORMAL RESULTS WILL BE CALLED  Testing/Procedures: None  Follow-Up: At [facility], you and your health needs are our priority.  As part of our continuing mission to provide you with exceptional heart care, we have created designated Provider Care Teams.  These Care Teams include your primary Cardiologist (physician) and Advanced Practice Providers (APPs -  Physician Assistants and Nurse Practitioners) who all work together to provide you with the care you need, when you need it.  Your next appointment:   2 month(s)  The format for your next appointment:   In Person  Provider:   You may see one of the following Advanced Practice Providers on your designated Care Team:    Cadence [place], [place]

## 2021-04-16 NOTE — Telephone Encounter (Signed)
I have called the pt and she stated that she does get the albuterol neb medication filled at [facility] and she does pay out of pocket for this.  She stated that she can afford to get this without going through her insurance.

## 2021-04-19 DIAGNOSIS — I214 Non-ST elevation (NSTEMI) myocardial infarction: Secondary | ICD-10-CM | POA: Diagnosis not present

## 2021-04-19 DIAGNOSIS — G8929 Other chronic pain: Secondary | ICD-10-CM | POA: Diagnosis not present

## 2021-04-19 DIAGNOSIS — J441 Chronic obstructive pulmonary disease with (acute) exacerbation: Secondary | ICD-10-CM | POA: Diagnosis not present

## 2021-04-19 DIAGNOSIS — I429 Cardiomyopathy, unspecified: Secondary | ICD-10-CM | POA: Diagnosis not present

## 2021-04-19 DIAGNOSIS — I10 Essential (primary) hypertension: Secondary | ICD-10-CM | POA: Diagnosis not present

## 2021-04-19 DIAGNOSIS — I2510 Atherosclerotic heart disease of native coronary artery without angina pectoris: Secondary | ICD-10-CM | POA: Diagnosis not present

## 2021-04-23 ENCOUNTER — Telehealth: Payer: Self-pay | Admitting: Family Medicine

## 2021-04-23 DIAGNOSIS — I429 Cardiomyopathy, unspecified: Secondary | ICD-10-CM | POA: Diagnosis not present

## 2021-04-23 DIAGNOSIS — I10 Essential (primary) hypertension: Secondary | ICD-10-CM | POA: Diagnosis not present

## 2021-04-23 DIAGNOSIS — I214 Non-ST elevation (NSTEMI) myocardial infarction: Secondary | ICD-10-CM | POA: Diagnosis not present

## 2021-04-23 DIAGNOSIS — I2510 Atherosclerotic heart disease of native coronary artery without angina pectoris: Secondary | ICD-10-CM | POA: Diagnosis not present

## 2021-04-23 DIAGNOSIS — J441 Chronic obstructive pulmonary disease with (acute) exacerbation: Secondary | ICD-10-CM | POA: Diagnosis not present

## 2021-04-23 DIAGNOSIS — G8929 Other chronic pain: Secondary | ICD-10-CM | POA: Diagnosis not present

## 2021-04-23 NOTE — Telephone Encounter (Signed)
Copied from [place] [id number]. Topic: Quick Communication - Home Health Verbal Orders >> Apr 23, 2021  3:24 PM Leward Quan A wrote: Caller/Agency: Maudie Mercury // [place] Number: [phone] [place] [facility]  Requesting OT/PT/Skilled Nursing/Social Work/Speech Therapy: Skilled Nursing Frequency: 1 w 8 and 2 PRN in case of any excerbation  COPD excerbation

## 2021-04-26 ENCOUNTER — Telehealth: Payer: Self-pay | Admitting: Family Medicine

## 2021-04-26 NOTE — Telephone Encounter (Signed)
Verbal orders given  

## 2021-04-26 NOTE — Telephone Encounter (Signed)
[place] Bells with [facility] called asking for extension order for treatment PT to one a time a week for 4 weeks and then every other week for 4 weeks.  CB#  [phone]

## 2021-04-27 LAB — BRAIN NATRIURETIC PEPTIDE: BNP: 156 pg/mL — ABNORMAL HIGH (ref 0.0–100.0)

## 2021-04-28 ENCOUNTER — Telehealth: Payer: Self-pay

## 2021-04-28 DIAGNOSIS — F17210 Nicotine dependence, cigarettes, uncomplicated: Secondary | ICD-10-CM | POA: Diagnosis not present

## 2021-04-28 DIAGNOSIS — I214 Non-ST elevation (NSTEMI) myocardial infarction: Secondary | ICD-10-CM | POA: Diagnosis not present

## 2021-04-28 DIAGNOSIS — I429 Cardiomyopathy, unspecified: Secondary | ICD-10-CM | POA: Diagnosis not present

## 2021-04-28 DIAGNOSIS — Z7952 Long term (current) use of systemic steroids: Secondary | ICD-10-CM | POA: Diagnosis not present

## 2021-04-28 DIAGNOSIS — F329 Major depressive disorder, single episode, unspecified: Secondary | ICD-10-CM | POA: Diagnosis not present

## 2021-04-28 DIAGNOSIS — I252 Old myocardial infarction: Secondary | ICD-10-CM | POA: Diagnosis not present

## 2021-04-28 DIAGNOSIS — K219 Gastro-esophageal reflux disease without esophagitis: Secondary | ICD-10-CM | POA: Diagnosis not present

## 2021-04-28 DIAGNOSIS — I2510 Atherosclerotic heart disease of native coronary artery without angina pectoris: Secondary | ICD-10-CM | POA: Diagnosis not present

## 2021-04-28 DIAGNOSIS — I10 Essential (primary) hypertension: Secondary | ICD-10-CM | POA: Diagnosis not present

## 2021-04-28 DIAGNOSIS — Z7982 Long term (current) use of aspirin: Secondary | ICD-10-CM | POA: Diagnosis not present

## 2021-04-28 DIAGNOSIS — Z7902 Long term (current) use of antithrombotics/antiplatelets: Secondary | ICD-10-CM | POA: Diagnosis not present

## 2021-04-28 DIAGNOSIS — Z981 Arthrodesis status: Secondary | ICD-10-CM | POA: Diagnosis not present

## 2021-04-28 DIAGNOSIS — Z79891 Long term (current) use of opiate analgesic: Secondary | ICD-10-CM | POA: Diagnosis not present

## 2021-04-28 DIAGNOSIS — G629 Polyneuropathy, unspecified: Secondary | ICD-10-CM | POA: Diagnosis not present

## 2021-04-28 DIAGNOSIS — E785 Hyperlipidemia, unspecified: Secondary | ICD-10-CM | POA: Diagnosis not present

## 2021-04-28 DIAGNOSIS — G8929 Other chronic pain: Secondary | ICD-10-CM | POA: Diagnosis not present

## 2021-04-28 DIAGNOSIS — S12120K Other displaced dens fracture, subsequent encounter for fracture with nonunion: Secondary | ICD-10-CM | POA: Diagnosis not present

## 2021-04-28 DIAGNOSIS — Z9181 History of falling: Secondary | ICD-10-CM | POA: Diagnosis not present

## 2021-04-28 DIAGNOSIS — J449 Chronic obstructive pulmonary disease, unspecified: Secondary | ICD-10-CM | POA: Diagnosis not present

## 2021-04-28 DIAGNOSIS — Z7951 Long term (current) use of inhaled steroids: Secondary | ICD-10-CM | POA: Diagnosis not present

## 2021-04-28 DIAGNOSIS — Z85828 Personal history of other malignant neoplasm of skin: Secondary | ICD-10-CM | POA: Diagnosis not present

## 2021-04-28 NOTE — Telephone Encounter (Signed)
-----   Message from [place], PA-C sent at 04/28/2021  4:22 PM EST ----- Labs showed mildly elevated BNP, minimal volume overload. Would encourage low salt diet, compression socks, and leg elevation. If breathing does not improve, may need low dose diuretic. Please ask how breathing has been.

## 2021-04-28 NOTE — Telephone Encounter (Signed)
Spoke with patient regarding results and recommendation.  She tells me that her breathing has been good up until the last couple of days. She states that she is short of breath when she is up moving around but denies any edema, or any other symptoms. She does not have a way to take her vital signs for me at this time.   I will route this back to Cadence, PA-C for further recommendations at this time.    Patient verbalizes understanding and is agreeable to plan of care. Advised patient to call back with any issues or concerns.

## 2021-05-03 ENCOUNTER — Telehealth: Payer: Self-pay | Admitting: Family Medicine

## 2021-05-03 DIAGNOSIS — M542 Cervicalgia: Secondary | ICD-10-CM

## 2021-05-03 NOTE — Telephone Encounter (Signed)
Medication Refill - Medication: traMADol (ULTRAM) 50 MG tablet (patient is completely out)  Has the patient contacted their pharmacy? No.  (Agent: If no, request that the patient contact the pharmacy for the refill. If patient does not wish to contact the pharmacy document the reason why and proceed with request.) Patient would like to change pharmacy due to [facility] delivering.    Preferred Pharmacy (with phone number or street name):  [facility] - [place], [place] - [address] Phone:  [phone]  Fax:  [phone]        Has the patient been seen for an appointment in the last year OR does the patient have an upcoming appointment? Yes.    Agent: Please be advised that RX refills may take up to 3 business days. We ask that you follow-up with your pharmacy.

## 2021-05-04 ENCOUNTER — Telehealth: Payer: Self-pay

## 2021-05-04 NOTE — Telephone Encounter (Signed)
Requested medication (s) are due for refill today: yes  Requested medication (s) are on the active medication list: yes  Last refill:  04/02/2209/28/22 #60  Future visit scheduled: yes  Notes to clinic:  Please review for refill. Refill not delegated per protocol.     Requested Prescriptions  Pending Prescriptions Disp Refills   traMADol (ULTRAM) 50 MG tablet 60 tablet 0    Sig: Take 1 tablet (50 mg total) by mouth 2 (two) times daily.     Not Delegated - Analgesics:  Opioid Agonists Failed - 05/03/2021  8:29 PM      Failed - This refill cannot be delegated      Failed - Urine Drug Screen completed in last 360 days      Passed - Valid encounter within last 6 months    Recent Outpatient Visits           1 month ago Chronic neck pain   [facility] Steele Sizer, MD   1 month ago Centrilobular emphysema [facility])   [facility] Rory Percy M, DO   1 month ago Non-ST elevation (NSTEMI) myocardial infarction [facility])   [facility] Rory Percy M, DO   8 months ago Atherosclerosis of aorta [facility])   [facility] Steele Sizer, MD   1 year ago Recurrent major depressive disorder, in full remission [facility])   [facility] Steele Sizer, MD       Future Appointments             In 1 month [place], [place], PA-C [place], [facility]   In 1 month Steele Sizer, MD [facility], [place]   In 2 months  [facility], [facility]

## 2021-05-04 NOTE — Progress Notes (Signed)
    Chronic Care Management Pharmacy Assistant   Name: Terri Burns  MRN: [id number] DOB: 1951-03-25  Prescription Refill Request  The patient left a voice mail requesting that I give her a call back.  I contacted the patient back, and she stated that she was out of her Tramadol and wanted to see if the refill she requested on 05/03/2021 was sent over to the provider. Patient also wanted to make sure that the [facility] would be [facility] to [facility] not [facility]. I informed her that I did see her message, but I would reach out to the providers office to double check for accuracy.  I spoke to the front desk receptionist, and she advised that they do have the message requesting the refill for the patient's Tramadol be sent to [facility]. They are aware that the patient is currently out of the medication, and they are currently working to get this sent over to the pharmacy. The request can normally take up to 72 hours.  Medications: Outpatient Encounter Medications as of 05/04/2021  Medication Sig   albuterol (PROVENTIL) (2.5 MG/3ML) 0.083% nebulizer solution Take 3 mLs (2.5 mg total) by nebulization every 4 (four) hours as needed for wheezing or shortness of breath.   albuterol (VENTOLIN HFA) 108 (90 Base) MCG/ACT inhaler Inhale 1-2 puffs into the lungs every 6 (six) hours as needed for wheezing or shortness of breath.   ALPRAZolam (XANAX) 0.25 MG tablet Take 1 tablet (0.25 mg total) by mouth 2 (two) times daily as needed for anxiety.   aspirin 81 MG EC tablet TAKE 1 TABLET BY MOUTH EVERY DAY   atorvastatin (LIPITOR) 80 MG tablet Take 1 tablet (80 mg total) by mouth daily.   blood glucose meter kit and supplies KIT Dispense based on patient and insurance preference. Use up to four times daily as directed.   citalopram (CELEXA) 20 MG tablet Take 1.5 tablets (30 mg total) by mouth every evening.   fluticasone (FLONASE) 50 MCG/ACT nasal spray PLACE TWO SPRAYS into BOTH nostrils DAILY    Fluticasone-Umeclidin-Vilant (TRELEGY ELLIPTA) 100-62.5-25 MCG/INH AEPB Inhale 1 puff into the lungs daily.   isosorbide mononitrate (IMDUR) 30 MG 24 hr tablet Take 0.5 tablets (15 mg total) by mouth daily.   loratadine (ALLERGY RELIEF) 10 MG tablet Take 1 tablet (10 mg total) by mouth daily.   losartan (COZAAR) 100 MG tablet Take 1 tablet (100 mg total) by mouth every evening.   metoprolol succinate (TOPROL-XL) 25 MG 24 hr tablet Take 1 tablet (25 mg total) by mouth every evening.   NARCAN 4 MG/0.1ML LIQD nasal spray kit 1 spray as needed. As needed in case of overdose   pantoprazole (PROTONIX) 40 MG tablet Take 1 tablet (40 mg total) by mouth every evening.   ticagrelor (BRILINTA) 90 MG TABS tablet Take 1 tablet (90 mg total) by mouth 2 (two) times daily.   traMADol (ULTRAM) 50 MG tablet Take 1 tablet (50 mg total) by mouth 2 (two) times daily.   No facility-administered encounter medications on file as of 05/04/2021.    Lynann Bologna, CPA/CMA Clinical Pharmacist Assistant Phone: [phone]

## 2021-05-04 NOTE — Telephone Encounter (Signed)
Reviewed provider recommendations and she verbalized understanding with no further questions at this time.    Kathlen Mody, Cadence H, PA-C  Sent: Tue May 04, 2021 12:32 PM   Message  Given she has no LLE and h/o COPD will not start diuretic at this time. Can reevaluate at follow-up.

## 2021-05-05 DIAGNOSIS — I429 Cardiomyopathy, unspecified: Secondary | ICD-10-CM | POA: Diagnosis not present

## 2021-05-05 DIAGNOSIS — G8929 Other chronic pain: Secondary | ICD-10-CM | POA: Diagnosis not present

## 2021-05-05 DIAGNOSIS — I2510 Atherosclerotic heart disease of native coronary artery without angina pectoris: Secondary | ICD-10-CM | POA: Diagnosis not present

## 2021-05-05 DIAGNOSIS — J449 Chronic obstructive pulmonary disease, unspecified: Secondary | ICD-10-CM | POA: Diagnosis not present

## 2021-05-05 DIAGNOSIS — I10 Essential (primary) hypertension: Secondary | ICD-10-CM | POA: Diagnosis not present

## 2021-05-05 DIAGNOSIS — I252 Old myocardial infarction: Secondary | ICD-10-CM | POA: Diagnosis not present

## 2021-05-05 NOTE — Telephone Encounter (Signed)
Not a patient at [facility].  

## 2021-05-06 MED ORDER — TRAMADOL HCL 50 MG PO TABS: 50.0000 mg | ORAL_TABLET | Freq: Two times a day (BID) | ORAL | 0 refills | Status: DC

## 2021-05-06 NOTE — Telephone Encounter (Signed)
Patient called checking status of refill. I told her of 48-72hr turn around.

## 2021-05-07 ENCOUNTER — Other Ambulatory Visit: Payer: Self-pay | Admitting: Family Medicine

## 2021-05-07 DIAGNOSIS — I252 Old myocardial infarction: Secondary | ICD-10-CM | POA: Diagnosis not present

## 2021-05-07 DIAGNOSIS — J449 Chronic obstructive pulmonary disease, unspecified: Secondary | ICD-10-CM | POA: Diagnosis not present

## 2021-05-07 DIAGNOSIS — M542 Cervicalgia: Secondary | ICD-10-CM

## 2021-05-07 DIAGNOSIS — G8929 Other chronic pain: Secondary | ICD-10-CM | POA: Diagnosis not present

## 2021-05-07 DIAGNOSIS — I429 Cardiomyopathy, unspecified: Secondary | ICD-10-CM | POA: Diagnosis not present

## 2021-05-07 DIAGNOSIS — I2510 Atherosclerotic heart disease of native coronary artery without angina pectoris: Secondary | ICD-10-CM | POA: Diagnosis not present

## 2021-05-07 DIAGNOSIS — I10 Essential (primary) hypertension: Secondary | ICD-10-CM | POA: Diagnosis not present

## 2021-05-08 ENCOUNTER — Telehealth: Payer: Self-pay

## 2021-05-08 NOTE — Progress Notes (Signed)
Chronic Care Management Pharmacy Assistant   Name: SAAYA PROCELL  MRN: [id number] DOB: 1950-11-22  Reason for Encounter: Medication Review/Medication Coordination   Recent office visits:  None ID  Recent consult visits:  04/16/2021 Fabienne Bruns PA-C (Cardiology) fro Follow-up- Changed: Naloxone 4 mg 1 spras prn in case overdose; Brain natriuretic peptide ordered, patient instructed to follow-up in 2 months  04/13/2021 Legrand Como Wert,MD (Pulmonary) for Consult- Started: Albuterol Sulfate 2.5 mg Nebulization q 4 hours prn, Stopped:  Ipratropium-Albuterol 0.5-2.5, Pulmonary Function Test ordered, Patient instructed to return in 6 weeks  Hospital visits:  None in previous 6 months  Medications: Outpatient Encounter Medications as of 05/08/2021  Medication Sig   albuterol (PROVENTIL) (2.5 MG/3ML) 0.083% nebulizer solution Take 3 mLs (2.5 mg total) by nebulization every 4 (four) hours as needed for wheezing or shortness of breath.   albuterol (VENTOLIN HFA) 108 (90 Base) MCG/ACT inhaler Inhale 1-2 puffs into the lungs every 6 (six) hours as needed for wheezing or shortness of breath.   ALPRAZolam (XANAX) 0.25 MG tablet Take 1 tablet (0.25 mg total) by mouth 2 (two) times daily as needed for anxiety.   aspirin 81 MG EC tablet TAKE 1 TABLET BY MOUTH EVERY DAY   atorvastatin (LIPITOR) 80 MG tablet Take 1 tablet (80 mg total) by mouth daily.   blood glucose meter kit and supplies KIT Dispense based on patient and insurance preference. Use up to four times daily as directed.   citalopram (CELEXA) 20 MG tablet Take 1.5 tablets (30 mg total) by mouth every evening.   fluticasone (FLONASE) 50 MCG/ACT nasal spray PLACE TWO SPRAYS into BOTH nostrils DAILY   Fluticasone-Umeclidin-Vilant (TRELEGY ELLIPTA) 100-62.5-25 MCG/INH AEPB Inhale 1 puff into the lungs daily.   isosorbide mononitrate (IMDUR) 30 MG 24 hr tablet Take 0.5 tablets (15 mg total) by mouth daily.   loratadine (ALLERGY RELIEF) 10 MG  tablet Take 1 tablet (10 mg total) by mouth daily.   losartan (COZAAR) 100 MG tablet Take 1 tablet (100 mg total) by mouth every evening.   metoprolol succinate (TOPROL-XL) 25 MG 24 hr tablet Take 1 tablet (25 mg total) by mouth every evening.   NARCAN 4 MG/0.1ML LIQD nasal spray kit 1 spray as needed. As needed in case of overdose   pantoprazole (PROTONIX) 40 MG tablet Take 1 tablet (40 mg total) by mouth every evening.   ticagrelor (BRILINTA) 90 MG TABS tablet Take 1 tablet (90 mg total) by mouth 2 (two) times daily.   traMADol (ULTRAM) 50 MG tablet Take 1 tablet (50 mg total) by mouth 2 (two) times daily.   No facility-administered encounter medications on file as of 05/08/2021.   Care Gaps: Colonoscopy COVID-19 Booster 3 Mammogram Dexa Scan  Star Rating Drugs: Losartan 100 mg last filled on 04/13/2021 for a 30-Day supply with [facility] Atorvastatin 80 mg last filled on 04/15/2021 for a 30-Day supply with [facility]  Reviewed chart for medication changes ahead of medication coordination call.  No OVs, Consults, or hospital visits since last care coordination call/Pharmacist visit. (If appropriate, list visit date, provider name)  No medication changes indicated OR if recent visit, treatment plan here.  BP Readings from Last 3 Encounters:  04/16/21 130/78  04/13/21 122/82  04/02/21 122/76    No results found for: HGBA1C   Patient obtains medications through Adherence Packaging  30 Days   Last adherence delivery included: Brilinta 90 mg 1 tablet twice daily (Breakfast, Bedtime) Atorvastatin 80 mg 1 tablet  daily (Breakfast) Trelegy Ellipta 100-62.5-25 mcg inhale 1 puff into lungs daily Metoprolol Succinate 25 mg 1 tablet daily (Evening Meal) Losartan 100 mg 1 tablet daily (Evening Meal) Pantoprazole 40 mg 1 tablet daily (Evening Meal) Loratadine 10 mg 1 tablet daily (Breakfast) Citalopram 20 mg 1.5 tablets daily (Evening Meal) Isosorbide Mononitrate 30 mg  Take 1/2 tablet daily (Breakfast) Narcan Injection use prn for overdose Flonase 50 mcg place 2 sprays into both nostrils daily  Patient declined medications last month:  Albuterol Inhaler 108 Inhale 1-2 puffs every six hours prn for wheezing or shortness of breath- PRN Alprazolam 0.25 mg 1 tablet twice daily prn for anxiety- PRN patient has ample supply  Patient is due for next adherence delivery on: 05/19/2021 (Wednesday) 2nd Route.  Called patient and reviewed medications and coordinated delivery.  This delivery to include: Flonase 50 mcg place 2 sprays into both nostrils daily Brilinta 90 mg 1 tablet twice daily (Breakfast, Bedtime) Atorvastatin 80 mg 1 tablet daily (Breakfast) Isosorbide Mononitrate 30 mg Take 1/2 tablet daily (Breakfast) Trelegy Ellipta 100-62.5-25 mcg inhale 1 puff into lungs daily Metoprolol Succinate 25 mg 1 tablet daily (Evening Meal) Losartan 100 mg 1 tablet daily (Evening Meal) Pantoprazole 40 mg 1 tablet daily (Evening Meal) Loratadine 10 mg 1 tablet daily (Breakfast) Citalopram 20 mg 1.5 tablets daily (Evening Meal) Albuterol Inhaler 108 Inhale 1-2 puffs every six hours prn for wheezing or shortness of breath- PRN  Coordinated acute fill for Tramadol 50 mg 1 tablet twice daily to be delivered Monday 05/10/2021 Acute Form completed and uploaded to CPP's folder 2 for approval  Patient declined the following medications : Albuterol 2.5 mg Solution (patient get's this medication from [facility]) Narcan Injection use prn for overdose  Patient needs refills for: No Refills Needed  Confirmed delivery date of 05/19/2021 2nd Route, advised patient that pharmacy will contact them the morning of delivery.  Patient has scheduled telephone visit with [name], CPP on 07/07/2021 @ [place], CPA/CMA Catering manager Phone: [phone]

## 2021-05-11 DIAGNOSIS — I10 Essential (primary) hypertension: Secondary | ICD-10-CM | POA: Diagnosis not present

## 2021-05-11 DIAGNOSIS — Z23 Encounter for immunization: Secondary | ICD-10-CM | POA: Diagnosis not present

## 2021-05-11 DIAGNOSIS — G8929 Other chronic pain: Secondary | ICD-10-CM | POA: Diagnosis not present

## 2021-05-11 DIAGNOSIS — I2510 Atherosclerotic heart disease of native coronary artery without angina pectoris: Secondary | ICD-10-CM | POA: Diagnosis not present

## 2021-05-11 DIAGNOSIS — I429 Cardiomyopathy, unspecified: Secondary | ICD-10-CM | POA: Diagnosis not present

## 2021-05-11 DIAGNOSIS — I252 Old myocardial infarction: Secondary | ICD-10-CM | POA: Diagnosis not present

## 2021-05-11 DIAGNOSIS — J449 Chronic obstructive pulmonary disease, unspecified: Secondary | ICD-10-CM | POA: Diagnosis not present

## 2021-05-12 ENCOUNTER — Other Ambulatory Visit: Payer: Self-pay | Admitting: Family Medicine

## 2021-05-12 DIAGNOSIS — M542 Cervicalgia: Secondary | ICD-10-CM

## 2021-05-12 DIAGNOSIS — G8929 Other chronic pain: Secondary | ICD-10-CM

## 2021-05-13 DIAGNOSIS — I252 Old myocardial infarction: Secondary | ICD-10-CM | POA: Diagnosis not present

## 2021-05-13 DIAGNOSIS — I2510 Atherosclerotic heart disease of native coronary artery without angina pectoris: Secondary | ICD-10-CM | POA: Diagnosis not present

## 2021-05-13 DIAGNOSIS — I429 Cardiomyopathy, unspecified: Secondary | ICD-10-CM | POA: Diagnosis not present

## 2021-05-13 DIAGNOSIS — I10 Essential (primary) hypertension: Secondary | ICD-10-CM | POA: Diagnosis not present

## 2021-05-13 DIAGNOSIS — J449 Chronic obstructive pulmonary disease, unspecified: Secondary | ICD-10-CM | POA: Diagnosis not present

## 2021-05-13 DIAGNOSIS — G8929 Other chronic pain: Secondary | ICD-10-CM | POA: Diagnosis not present

## 2021-05-20 DIAGNOSIS — G8929 Other chronic pain: Secondary | ICD-10-CM | POA: Diagnosis not present

## 2021-05-20 DIAGNOSIS — I429 Cardiomyopathy, unspecified: Secondary | ICD-10-CM | POA: Diagnosis not present

## 2021-05-20 DIAGNOSIS — J449 Chronic obstructive pulmonary disease, unspecified: Secondary | ICD-10-CM | POA: Diagnosis not present

## 2021-05-20 DIAGNOSIS — I252 Old myocardial infarction: Secondary | ICD-10-CM | POA: Diagnosis not present

## 2021-05-20 DIAGNOSIS — I10 Essential (primary) hypertension: Secondary | ICD-10-CM | POA: Diagnosis not present

## 2021-05-20 DIAGNOSIS — I2510 Atherosclerotic heart disease of native coronary artery without angina pectoris: Secondary | ICD-10-CM | POA: Diagnosis not present

## 2021-06-03 ENCOUNTER — Telehealth: Payer: Self-pay

## 2021-06-03 ENCOUNTER — Other Ambulatory Visit: Payer: Self-pay | Admitting: Family Medicine

## 2021-06-03 DIAGNOSIS — M542 Cervicalgia: Secondary | ICD-10-CM

## 2021-06-03 NOTE — Telephone Encounter (Signed)
Medication Refill - Medication: traMADol (ULTRAM) 50 MG tablet  Has the patient contacted their pharmacy? Yes.   (Agent: If no, request that the patient contact the pharmacy for the refill. If patient does not wish to contact the pharmacy document the reason why and proceed with request.) (Agent: If yes, when and what did the pharmacy advise?)  Preferred Pharmacy (with phone number or street name):  [facility] - [place], [place] - [address]  [address], [place] [address]  Phone:  [phone]  Fax:  [phone]  Has the patient been seen for an appointment in the last year OR does the patient have an upcoming appointment? Yes.    Agent: Please be advised that RX refills may take up to 3 business days. We ask that you follow-up with your pharmacy.

## 2021-06-03 NOTE — Telephone Encounter (Signed)
Requested medication (s) are on the active medication list: yes  Future visit scheduled: 06/30/21  Notes to clinic:  This medication can not be delegated, please assess.   Requested Prescriptions  Pending Prescriptions Disp Refills   traMADol (ULTRAM) 50 MG tablet 60 tablet 0    Sig: Take 1 tablet (50 mg total) by mouth 2 (two) times daily.     Not Delegated - Analgesics:  Opioid Agonists Failed - 06/03/2021  4:24 PM      Failed - This refill cannot be delegated      Failed - Urine Drug Screen completed in last 360 days      Passed - Valid encounter within last 6 months    Recent Outpatient Visits           2 months ago Chronic neck pain   [facility] Steele Sizer, MD   2 months ago Centrilobular emphysema [facility])   [facility] Rory Percy M, DO   2 months ago Non-ST elevation (NSTEMI) myocardial infarction [facility])   [facility] Rory Percy M, DO   9 months ago Atherosclerosis of aorta [facility])   [facility] Steele Sizer, MD   1 year ago Recurrent major depressive disorder, in full remission [facility])   [facility] Steele Sizer, MD       Future Appointments             In 1 week Furth, [place], PA-C [place], [facility]   In 3 weeks Steele Sizer, MD [facility], [place]   In 1 month  [place]

## 2021-06-03 NOTE — Progress Notes (Signed)
° ° °  Chronic Care Management Pharmacy Assistant   Name: Terri [facility]  MRN: [id number] DOB: May 21, 1951  Medication Refill  Patient called, and left a voice mail requesting a refill for her Tramadol be sent to [facility].   I contacted [facility], and spoke with [facility] who stated she would send the refill request to Dr. Ancil Boozer. I did inform her that the refill should be sent to [facility].   I contacted the patient to inform her that the request was sent over to the PCP.  Medications: Outpatient Encounter Medications as of 06/03/2021  Medication Sig   albuterol (PROVENTIL) (2.5 MG/3ML) 0.083% nebulizer solution Take 3 mLs (2.5 mg total) by nebulization every 4 (four) hours as needed for wheezing or shortness of breath.   albuterol (VENTOLIN HFA) 108 (90 Base) MCG/ACT inhaler Inhale 1-2 puffs into the lungs every 6 (six) hours as needed for wheezing or shortness of breath.   ALPRAZolam (XANAX) 0.25 MG tablet Take 1 tablet (0.25 mg total) by mouth 2 (two) times daily as needed for anxiety.   aspirin 81 MG EC tablet TAKE 1 TABLET BY MOUTH EVERY DAY   atorvastatin (LIPITOR) 80 MG tablet Take 1 tablet (80 mg total) by mouth daily.   blood glucose meter kit and supplies KIT Dispense based on patient and insurance preference. Use up to four times daily as directed.   citalopram (CELEXA) 20 MG tablet Take 1.5 tablets (30 mg total) by mouth every evening.   fluticasone (FLONASE) 50 MCG/ACT nasal spray PLACE TWO SPRAYS into BOTH nostrils DAILY   Fluticasone-Umeclidin-Vilant (TRELEGY ELLIPTA) 100-62.5-25 MCG/INH AEPB Inhale 1 puff into the lungs daily.   isosorbide mononitrate (IMDUR) 30 MG 24 hr tablet Take 0.5 tablets (15 mg total) by mouth daily.   loratadine (ALLERGY RELIEF) 10 MG tablet Take 1 tablet (10 mg total) by mouth daily.   losartan (COZAAR) 100 MG tablet Take 1 tablet (100 mg total) by mouth every evening.   metoprolol succinate (TOPROL-XL) 25 MG 24 hr tablet  Take 1 tablet (25 mg total) by mouth every evening.   NARCAN 4 MG/0.1ML LIQD nasal spray kit 1 spray as needed. As needed in case of overdose   pantoprazole (PROTONIX) 40 MG tablet Take 1 tablet (40 mg total) by mouth every evening.   ticagrelor (BRILINTA) 90 MG TABS tablet Take 1 tablet (90 mg total) by mouth 2 (two) times daily.   traMADol (ULTRAM) 50 MG tablet Take 1 tablet (50 mg total) by mouth 2 (two) times daily.   No facility-administered encounter medications on file as of 06/03/2021.    Lynann Bologna, CPA/CMA Clinical Pharmacist Assistant Phone: [phone]

## 2021-06-04 ENCOUNTER — Other Ambulatory Visit: Payer: Self-pay | Admitting: Family Medicine

## 2021-06-04 ENCOUNTER — Other Ambulatory Visit: Payer: Self-pay

## 2021-06-04 DIAGNOSIS — M542 Cervicalgia: Secondary | ICD-10-CM

## 2021-06-08 ENCOUNTER — Telehealth: Payer: Self-pay

## 2021-06-08 DIAGNOSIS — F330 Major depressive disorder, recurrent, mild: Secondary | ICD-10-CM

## 2021-06-08 NOTE — Progress Notes (Signed)
Chronic Care Management Pharmacy Assistant   Name: PECOLIA MARANDO  MRN: [id number] DOB: 1950/12/07  Reason for Encounter: Medication Review/Medication Coordination Call   Recent office visits:  None ID  Recent consult visits:  None ID  Hospital visits:  None in previous 6 months  Medications: Outpatient Encounter Medications as of 06/08/2021  Medication Sig   albuterol (PROVENTIL) (2.5 MG/3ML) 0.083% nebulizer solution Take 3 mLs (2.5 mg total) by nebulization every 4 (four) hours as needed for wheezing or shortness of breath.   albuterol (VENTOLIN HFA) 108 (90 Base) MCG/ACT inhaler Inhale 1-2 puffs into the lungs every 6 (six) hours as needed for wheezing or shortness of breath.   ALPRAZolam (XANAX) 0.25 MG tablet Take 1 tablet (0.25 mg total) by mouth 2 (two) times daily as needed for anxiety.   aspirin 81 MG EC tablet TAKE 1 TABLET BY MOUTH EVERY DAY   atorvastatin (LIPITOR) 80 MG tablet Take 1 tablet (80 mg total) by mouth daily.   blood glucose meter kit and supplies KIT Dispense based on patient and insurance preference. Use up to four times daily as directed.   citalopram (CELEXA) 20 MG tablet Take 1.5 tablets (30 mg total) by mouth every evening.   fluticasone (FLONASE) 50 MCG/ACT nasal spray PLACE TWO SPRAYS into BOTH nostrils DAILY   Fluticasone-Umeclidin-Vilant (TRELEGY ELLIPTA) 100-62.5-25 MCG/INH AEPB Inhale 1 puff into the lungs daily.   isosorbide mononitrate (IMDUR) 30 MG 24 hr tablet Take 0.5 tablets (15 mg total) by mouth daily.   loratadine (ALLERGY RELIEF) 10 MG tablet Take 1 tablet (10 mg total) by mouth daily.   losartan (COZAAR) 100 MG tablet Take 1 tablet (100 mg total) by mouth every evening.   metoprolol succinate (TOPROL-XL) 25 MG 24 hr tablet Take 1 tablet (25 mg total) by mouth every evening.   NARCAN 4 MG/0.1ML LIQD nasal spray kit 1 spray as needed. As needed in case of overdose   pantoprazole (PROTONIX) 40 MG tablet Take 1 tablet (40 mg total) by  mouth every evening.   ticagrelor (BRILINTA) 90 MG TABS tablet Take 1 tablet (90 mg total) by mouth 2 (two) times daily.   traMADol (ULTRAM) 50 MG tablet TAKE ONE TABLET BY MOUTH twice daily   No facility-administered encounter medications on file as of 06/08/2021.   Care Gaps: Colonoscopy COVID-19 Booster 3 Mammogram Dexa Scan  Star Rating Drugs: Losartan 100 mg last filled 05/15/2021 for a 30-Day supply with [facility] Atorvastatin 80 mg last filled 05/15/2021 for a 30-Day supply with [facility]  BP Readings from Last 3 Encounters:  04/16/21 130/78  04/13/21 122/82  04/02/21 122/76    No results found for: HGBA1C   Patient obtains medications through Adherence Packaging  30 Days   Last adherence delivery included:  Flonase 50 mcg place 2 sprays into both nostrils daily Brilinta 90 mg 1 tablet twice daily (Breakfast, Bedtime) Atorvastatin 80 mg 1 tablet daily (Breakfast) Isosorbide Mononitrate 30 mg Take 1/2 tablet daily (Breakfast) Trelegy Ellipta 100-62.5-25 mcg inhale 1 puff into lungs daily Metoprolol Succinate 25 mg 1 tablet daily (Evening Meal) Losartan 100 mg 1 tablet daily (Evening Meal) Pantoprazole 40 mg 1 tablet daily (Evening Meal) Loratadine 10 mg 1 tablet daily (Breakfast) Citalopram 20 mg 1.5 tablets daily (Evening Meal)  Patient declined medications last month: Albuterol Inhaler 108 Inhale 1-2 puffs every six hours prn for wheezing or shortness of breath- PRN Alprazolam 0.25 mg 1 tablet twice daily prn for anxiety- PRN patient  has ample supply  Patient is due for next adherence delivery on: 06/17/2021 (Thursday) 1st Route.  Called patient and reviewed medications and coordinated delivery.  This delivery to include: Brilinta 90 mg 1 tablet twice daily (Breakfast, Bedtime) Atorvastatin 80 mg 1 tablet daily (Breakfast) Isosorbide Mononitrate 30 mg Take 1/2 tablet daily (Breakfast) Trelegy Ellipta 100-62.5-25 mcg inhale 1 puff into lungs  daily Metoprolol Succinate 25 mg 1 tablet daily (Evening Meal) Losartan 100 mg 1 tablet daily (Evening Meal) Pantoprazole 40 mg 1 tablet daily (Evening Meal) Loratadine 10 mg 1 tablet daily (Breakfast) Citalopram 20 mg 1.5 tablets daily (Evening Meal)  Patient will need a short fill of Tramadol 50 mg, prior to adherence delivery. She will need this medication delivered on 06/09/2021. Per [place] they will deliver the 30-Day supply to this patient on 06/09/2021  Patient declined the following medications:  Albuterol Inhaler 108 Inhale 1-2 puffs every six hours prn for wheezing or shortness of breath- PRN Xanax 0.25 mg 1 tablet twice daily prn for anxiety- PRN  Flonase 50 mcg place 2 sprays into both nostrils daily- patient stated she has a sufficient amount  Patient needs refills for Atorvastatin 80 mg, Citalopram 20 mg, and Tramadol 50 mg (per patient's chart it appears refill for this was sent by PCP on 06/04/2021.  Confirmed delivery date of 06/17/2021 1st Route, advised patient that pharmacy will contact them the morning of delivery.  Patient has a telephone appointment with [name], CPP on 07/07/2021 @ [place], CPA/CMA Clinical Production designer, theatre/television/film Phone: [phone]

## 2021-06-09 MED ORDER — CITALOPRAM HYDROBROMIDE 20 MG PO TABS: 30.0000 mg | ORAL_TABLET | Freq: Every evening | ORAL | 1 refills | Status: DC

## 2021-06-09 MED ORDER — ATORVASTATIN CALCIUM 80 MG PO TABS: 80.0000 mg | ORAL_TABLET | Freq: Every day | ORAL | 1 refills | Status: DC

## 2021-06-09 NOTE — Addendum Note (Signed)
Addended by: Daron Offer A on: 06/09/2021 08:23 AM   Modules accepted: Orders

## 2021-06-15 ENCOUNTER — Other Ambulatory Visit: Payer: Self-pay

## 2021-06-15 ENCOUNTER — Encounter: Payer: Self-pay | Admitting: Medical

## 2021-06-15 ENCOUNTER — Ambulatory Visit (INDEPENDENT_AMBULATORY_CARE_PROVIDER_SITE_OTHER): Payer: Medicare Other | Admitting: Medical

## 2021-06-15 VITALS — BP 150/90 | HR 70 | Ht 60.0 in | Wt 133.0 lb

## 2021-06-15 DIAGNOSIS — I2510 Atherosclerotic heart disease of native coronary artery without angina pectoris: Secondary | ICD-10-CM

## 2021-06-15 DIAGNOSIS — I10 Essential (primary) hypertension: Secondary | ICD-10-CM

## 2021-06-15 DIAGNOSIS — R0609 Other forms of dyspnea: Secondary | ICD-10-CM

## 2021-06-15 DIAGNOSIS — I5032 Chronic diastolic (congestive) heart failure: Secondary | ICD-10-CM | POA: Diagnosis not present

## 2021-06-15 DIAGNOSIS — J449 Chronic obstructive pulmonary disease, unspecified: Secondary | ICD-10-CM | POA: Diagnosis not present

## 2021-06-15 NOTE — Progress Notes (Signed)
Cardiology Office Note:    Date:  06/15/2021   ID:  Terri Burns, Terri Burns 1951/02/17, MRN [id number]  PCP:  Steele Sizer, MD  [facility] Cardiologist:  None  [facility] Electrophysiologist:  None   Referring MD: Steele Sizer, MD   Chief Complaint: 2 month follow-up  History of Present Illness:    Terri Burns is a 71 y.o. female with a hx of severe one-vessel CAD and LHC with PCI to LAD, 02/2021 non-STEMI, HFpEF, COPD, current tobacco use, hypertension, hyperlipidemia who presents for 1 month f/u.    On 02/19/2021, she presented to the [facility] ED with shortness of breath.  She was admitted and subsequently had chest tightness 9/18 around 5 AM.  This chest tightness/pressure continued over the next 2 hours despite administration of painkillers and breathing treatments.  At 1 point, the chest tightness radiating to under her left arm.  She also recalled pain between her shoulder blades the previous night.  EKG showed Q waves in the inferior 2, 3, aVF and precordial lateral leads, as well as T wave inversion in upsloping ST changes.  High-sensitivity troponin was cycled and 308, though this lab was not cycled further to determine a peak in her troponin.  Recommendation was for further ischemic workup given RF for CAD and via LHC.   LHC 02/23/2021 showed severe one-vessel CAD with 99% thrombotic stenosis of the proximal LAD, which was not the culprit of her non-ST elevation myocardial infarction.  Mild to moderate left circumflex and RCA disease was also noted.  In addition, moderately to severely reduced LV SF with EF 25 to 30% and severe anterior/apical hypokinesis is noted.  She had severely elevated LVEDP at 40 mmHg.  Successful balloon angioplasty with aspiration thrombectomy and drug-eluting stent placement to the proximal LAD was performed.  Initially, the plan was to MACE intravascular lithotripsy given the calcifications; however, she developed chest pain with sluggish flow to the LAD  just before PCI was started.  Recommendations were to continue DAPT for at least 12 months with aggressive treatment of risk factors.  She was also given IV furosemide for her elevated volume status.  The remainder of her CAD was to be treated medically.     Echo, performed 6, showed EF 55 to 60%, G1 DD, mild to moderate aortic valve sclerosis without stenosis.     At discharge, it was noted that further titration of GDMT was limited by hypotension and bradycardia.  Repeat limited echo at follow-up was recommended due to inconsistency in patient EF, specifically catheterization showed EF 25 to 30% and echo EF 55 to 60%.  Amlodipine was held due to low BP. PT/OT consulted with pt report she was living alone and uses a walker at home.  Last seen 04/16/21 and reported persistent DOE, possible from [place]. BNP checked.   Today, the patient reported she has been doing good. Breathing has been better, whether seems to makes it worse. No chest pain. BP a little high, but has not had her medications. She also feels a little anxious.     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    Hyperlipidemia    Hypertension    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    Past Surgical History:  Procedure Laterality Date   CORONARY STENT INTERVENTION N/A 02/22/2021   Procedure: CORONARY STENT INTERVENTION;  Surgeon: Wellington Hampshire, MD;  Location: [facility];  Service: Cardiovascular;  Laterality: N/A;   LEFT HEART CATH AND CORONARY ANGIOGRAPHY N/A 02/22/2021   Procedure: LEFT HEART CATH AND CORONARY ANGIOGRAPHY;  Surgeon: Wellington Hampshire, MD;  Location: [place] [facility];  Service: Cardiovascular;  Laterality: N/A;   NECK SURGERY N/A 10/2003   SHOULDER SURGERY Left 2004     Current Medications: Current Meds  Medication Sig   albuterol (PROVENTIL) (2.5 MG/3ML) 0.083% nebulizer solution Take 3 mLs (2.5 mg total) by nebulization every 4 (four) hours as needed for wheezing or shortness of breath.   albuterol (VENTOLIN HFA) 108 (90 Base) MCG/ACT inhaler Inhale 1-2 puffs into the lungs every 6 (six) hours as needed for wheezing or shortness of breath.   ALPRAZolam (XANAX) 0.25 MG tablet Take 1 tablet (0.25 mg total) by mouth 2 (two) times daily as needed for anxiety.   aspirin 81 MG EC tablet TAKE 1 TABLET BY MOUTH EVERY DAY   atorvastatin (LIPITOR) 80 MG tablet Take 1 tablet (80 mg total) by mouth daily.   blood glucose meter kit and supplies KIT Dispense based on patient and insurance preference. Use up to four times daily as directed.   citalopram (CELEXA) 20 MG tablet Take 1.5 tablets (30 mg total) by mouth every evening.   fluticasone (FLONASE) 50 MCG/ACT nasal spray PLACE TWO SPRAYS into BOTH nostrils DAILY   Fluticasone-Umeclidin-Vilant (TRELEGY ELLIPTA) 100-62.5-25 MCG/INH AEPB Inhale 1 puff into the lungs daily.   isosorbide mononitrate (IMDUR) 30 MG 24 hr tablet Take 0.5 tablets (15 mg total) by mouth daily.   loratadine (ALLERGY RELIEF) 10 MG tablet Take 1 tablet (10 mg total) by mouth daily.   losartan (COZAAR) 100 MG tablet Take 1 tablet (100 mg total) by mouth every evening.   metoprolol succinate (TOPROL-XL) 25 MG 24 hr tablet Take 1 tablet (25 mg total) by mouth every evening.   NARCAN 4 MG/0.1ML LIQD nasal spray kit 1 spray as needed. As needed in case of overdose   pantoprazole (PROTONIX) 40 MG tablet Take 1 tablet (40 mg total) by mouth every evening.   ticagrelor (BRILINTA) 90 MG TABS tablet Take 1 tablet (90 mg total) by mouth 2 (two) times daily.   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   Her daughter died due to heart issues on 2018/11/08 at the age of 35.   Both sons live in town and are helping her out    Social Determinants of Radio broadcast assistant Strain: Low Risk    Difficulty of Paying Living Expenses: Not hard at all  Food Insecurity: No Food Insecurity   Worried About Charity fundraiser in the Last Year: Never true   Arboriculturist in the Last Year: Never true  Transportation Needs: No Transportation Needs   Lack of Transportation (Medical): No   Lack of Transportation (Non-Medical): No  Physical Activity: Inactive   Days of Exercise per Week: 0 days   Minutes of Exercise per Session: 0 min  Stress: No Stress Concern Present   Feeling of Stress : Not at all  Social Connections: Moderately Integrated   Frequency of Communication with Friends and Family: More than three times a week   Frequency of Social Gatherings with Friends and Family: More than three times a week   Attends Religious Services: More than 4 times per year   Active Member of [employer] or Organizations: Yes   Attends Archivist Meetings: More than 4 times per year   Marital  Status: Widowed     Family History: The patient's family history includes Diabetes in her father; Heart disease in her daughter and father; Hypertension in her mother and son; Pulmonary embolism in her daughter; Stroke in her mother.  ROS:   Please see the history of present illness.     All other systems reviewed and are negative.  EKGs/Labs/Other Studies Reviewed:    The following studies were reviewed today: Cath Left Heart   Left Ventricle The left ventricle is mildly dilated. There is moderate to severe left ventricular systolic dysfunction. LV end diastolic pressure is severely elevated. The left ventricular ejection fraction is 25-35% by visual estimate. There are LV function abnormalities due to segmental dysfunction.    Coronary Diagrams   Diagnostic Dominance: Right Intervention    02/22/21   Prox RCA to Mid RCA lesion is 60% stenosed.   Ost LAD to Prox LAD lesion is 25% stenosed.   Ost Cx to Prox Cx lesion is 40% stenosed.   1st Mrg lesion is 30% stenosed.   Prox LAD lesion is 99% stenosed.   A drug-eluting stent was successfully placed using a STENT ONYX FRONTIER 3.0X18.   Post intervention, there is a 0% residual stenosis.   There is moderate to severe left ventricular systolic dysfunction.   LV end diastolic pressure is severely elevated.   The left ventricular ejection fraction is 25-35% by visual estimate.   1.  Severe one-vessel coronary artery disease with 99% thrombotic stenosis in the proximal LAD which seems to be the culprit for non-ST elevation myocardial infarction.  In addition, there is mild to moderate left circumflex and RCA disease. 2.  Moderately to severely reduced LV systolic function with an EF of 25 to 30% with severe anterior/apical hypokinesis. 3.  Severely elevated left ventricular end-diastolic pressure at 40 mmHg. 4.  Successful balloon angioplasty, aspiration thrombectomy and drug-eluting stent placement to the proximal LAD.  The initial  plan was to use intravascular lithotripsy given calcifications but the patient developed chest pain with sluggish flow in the LAD just before the PCI was started. Recommendations: Dual antiplatelet  therapy for at least 12 months. Aggressive treatment of risk factors. The patient was given 1 dose of IV furosemide 20 mg during the case with good urine output.  Monitor respiratory status and consider additional diuresis as needed. Treat the rest of the coronary artery disease medically.   Echo 02/22/21  1. Left ventricular ejection fraction, by estimation, is 55 to 60%. The  left ventricle has normal function. Left ventricular endocardial border  not optimally defined to evaluate regional wall motion. Left ventricular  diastolic parameters are consistent  with Grade I diastolic dysfunction (impaired relaxation).   2. Right ventricular systolic function is normal. The right ventricular  size is normal. Tricuspid regurgitation signal is inadequate for assessing  PA pressure.   3. The mitral valve is normal in structure. No evidence of mitral valve  regurgitation. No evidence of mitral stenosis.   4. The aortic valve is normal in structure. Aortic valve regurgitation is  not visualized. Mild to moderate aortic valve sclerosis/calcification is  present, without any evidence of aortic stenosis.   5. The inferior vena cava is normal in size with greater than 50%  respiratory variability, suggesting right atrial pressure of 3 mmHg.       EKG:  EKG is not ordered today.    Recent Labs: 02/06/2021: Magnesium 2.3 02/19/2021: ALT 52 03/11/2021: BUN 13; Creatinine, Ser 0.84; Hemoglobin 11.9; Platelets 220; Potassium 5.1; Sodium 139 04/16/2021: BNP 156.0  Recent Lipid Panel    Component Value Date/Time   CHOL 173 02/21/2020 1453   TRIG 39 02/21/2020 1453   HDL 101 02/21/2020 1453   CHOLHDL 1.7 02/21/2020 1453   LDLCALC 61 02/21/2020 1453   LDLDIRECT 45.5 02/25/2021 0931    Physical Exam:     VS:  BP (!) 150/90 (BP Location: Left Arm, Patient Position: Sitting, Cuff Size: Normal)    Pulse 70    Ht 5' (1.524 m)    Wt 133 lb (60.3 kg)    SpO2 97%    BMI 25.97 kg/m     Wt Readings from Last 3 Encounters:  06/15/21 133 lb (60.3 kg)  04/16/21 134 lb (60.8 kg)  04/13/21 134 lb 12.8 oz (61.1 kg)     GEN:  Well nourished, well developed in no acute distress HEENT: Normal NECK: No JVD; No carotid bruits LYMPHATICS: No lymphadenopathy CARDIAC: RRR, no murmurs, rubs, gallops RESPIRATORY:  Clear to auscultation without rales, wheezing or rhonchi  ABDOMEN: Soft, non-tender, non-distended MUSCULOSKELETAL:  No edema; No deformity  SKIN: Warm and dry NEUROLOGIC:  Alert and oriented x 3 PSYCHIATRIC:  Normal affect   ASSESSMENT:    1. DOE (dyspnea on exertion)   2. Coronary artery disease involving native coronary artery of native heart without angina pectoris   3. Chronic diastolic heart failure ([place])   4. Essential hypertension   5. Chronic obstructive pulmonary disease, unspecified COPD type ([place])    PLAN:    In order of problems listed above:  DOE CAD s/p PCI to the pLAD Breathing has improved since the last visit. No chest pain. No further ischemic work-up indicated at this time. Continue Aspirin 59m daily, Toprol 12.570mdaily, and statin. Continue DAPT with   HFpEF Patient is euvolemic on exam. Low salt diet encouraged. Continue BB  HTN BP is high, but she has not had her medications today. Continue Toprol  COPD Tobacco use Occasionally smokes. Complete cessation encouraged.    Disposition: Follow up in 3 month(s) with MD/APP    Signed, Shalay Carder  Ninfa Meeker, PA-C  06/15/2021 3:24 PM    [facility]

## 2021-06-15 NOTE — Patient Instructions (Signed)
Medication Instructions:  Your physician recommends that you continue on your current medications as directed. Please refer to the Current Medication list given to you today.   *If you need a refill on your cardiac medications before your next appointment, please call your pharmacy*   Lab Work: None ordered  If you have labs (blood work) drawn today and your tests are completely normal, you will receive your results only by: [place] (if you have MyChart) OR A paper copy in the mail If you have any lab test that is abnormal or we need to change your treatment, we will call you to review the results.   Testing/Procedures: None ordered   Follow-Up: At [facility], you and your health needs are our priority.  As part of our continuing mission to provide you with exceptional heart care, we have created designated Provider Care Teams.  These Care Teams include your primary Cardiologist (physician) and Advanced Practice Providers (APPs -  Physician Assistants and Nurse Practitioners) who all work together to provide you with the care you need, when you need it.  We recommend signing up for the patient portal called "MyChart".  Sign up information is provided on this After Visit Summary.  MyChart is used to connect with patients for Virtual Visits (Telemedicine).  Patients are able to view lab/test results, encounter notes, upcoming appointments, etc.  Non-urgent messages can be sent to your provider as well.   To learn more about what you can do with MyChart, go to [email or web address].    Your next appointment:   3 month(s)  The format for your next appointment:   In Person  Provider:   One of the following Advanced Practice Providers on your designated Care Team:   Murray Hodgkins, NP Christell Faith, PA-C Cadence Kathlen Mody, PA-C  :1}    Other Instructions N/A

## 2021-06-30 ENCOUNTER — Ambulatory Visit: Payer: Medicare Other | Admitting: Family Medicine

## 2021-07-06 ENCOUNTER — Telehealth: Payer: Self-pay

## 2021-07-06 ENCOUNTER — Other Ambulatory Visit: Payer: Self-pay | Admitting: Family Medicine

## 2021-07-06 DIAGNOSIS — G8929 Other chronic pain: Secondary | ICD-10-CM

## 2021-07-06 DIAGNOSIS — M542 Cervicalgia: Secondary | ICD-10-CM

## 2021-07-06 NOTE — Telephone Encounter (Signed)
Medication Refill - Medication: traMADol (ULTRAM) 50 MG tablet  Has the patient contacted their pharmacy? No. Pharmacist calling (Agent: If no, request that the patient contact the pharmacy for the refill. If patient does not wish to contact the pharmacy document the reason why and proceed with request.)   Preferred Pharmacy (with phone number or street name):  [facility] - [place], [place] - [address]  [address] [place] [address]  Phone: [phone] Fax: [phone]  Hours: Not open 24 hours   Has the patient been seen for an appointment in the last year OR does the patient have an upcoming appointment? Yes.    Agent: Please be advised that RX refills may take up to 3 business days. We ask that you follow-up with your pharmacy.

## 2021-07-06 NOTE — Progress Notes (Signed)
° ° °  Chronic Care Management Pharmacy Assistant   Name: Terri Burns  MRN: [id number] DOB: 06-26-1950  Patient called to be reminded of her telephone appointment with [name], CPP on 07/07/2021 @ 1500  Patient aware of appointment date, time, and type of appointment (either telephone or in person). Patient aware to have/bring all medications, supplements, blood pressure and/or blood sugar logs to visit.  Questions: Are there any concerns you would like to discuss during your office visit? Nothing at this time  Are you having any problems obtaining your medications? Not at this time. Patient is requesting refill on her Tramadol I contacted PCP's office to request the refill for the patient.   Star Rating Drug: Atorvastatin 80 mg last filled on 06/14/2021 for a 30-Day supply with [facility] Losartan 100 mg last filled on 06/14/2021 for a 30-Day supply with [facility]  Any gaps in medications fill history? No  Care Gaps: Zoster Vaccines Colonoscopy COVID-19 Booster 3 Mammogram Dexa Scan   Lynann Bologna, CPA/CMA Clinical Pharmacist Assistant Phone: [phone]

## 2021-07-06 NOTE — Telephone Encounter (Signed)
Requested medication (s) are due for refill today: yes  Requested medication (s) are on the active medication list: yes    Last refill: 06/04/21  #60  0 refills  Future visit scheduled yes 07/08/21  Notes to clinic:Not delegated. Please review.  Requested Prescriptions  Pending Prescriptions Disp Refills   traMADol (ULTRAM) 50 MG tablet 60 tablet 0    Sig: Take 1 tablet (50 mg total) by mouth 2 (two) times daily.     Not Delegated - Analgesics:  Opioid Agonists Failed - 07/06/2021  9:28 AM      Failed - This refill cannot be delegated      Failed - Urine Drug Screen completed in last 360 days      Passed - Valid encounter within last 6 months    Recent Outpatient Visits           3 months ago Chronic neck pain   [facility] Steele Sizer, MD   3 months ago Centrilobular emphysema [facility])   [facility] Rory Percy M, DO   4 months ago Non-ST elevation (NSTEMI) myocardial infarction [facility])   [facility] Rory Percy M, DO   10 months ago Atherosclerosis of aorta [facility])   [facility] Steele Sizer, MD   1 year ago Recurrent major depressive disorder, in full remission [facility])   [facility] Steele Sizer, MD       Future Appointments             In 2 days Steele Sizer, MD [place]   In 1 week  [place]   In 2 months Furth, Cadence H, PA-C [id number]

## 2021-07-07 ENCOUNTER — Ambulatory Visit (INDEPENDENT_AMBULATORY_CARE_PROVIDER_SITE_OTHER): Payer: Medicare Other

## 2021-07-07 DIAGNOSIS — Z720 Tobacco use: Secondary | ICD-10-CM

## 2021-07-07 DIAGNOSIS — J449 Chronic obstructive pulmonary disease, unspecified: Secondary | ICD-10-CM

## 2021-07-07 NOTE — [place] Notes (Signed)
Chronic Care Management Pharmacy Note  07/08/2021 Name:  Terri Burns MRN:  [id number] DOB:  1951-03-20  Summary: Patient presents for CCM follow-up. She is doing well overall. She continues to smoke 2-3 cigarettes daily.  Recommendations/Changes made from today's visit:   Plan: CPP follow-up in 3 months   Subjective: Terri Burns is an 71 y.o. year old female who is a primary patient of Steele Sizer, MD.  The CCM team was consulted for assistance with disease management and care coordination needs.    Engaged with patient by telephone for follow up visit in response to provider referral for pharmacy case management and/or care coordination services.   Consent to Services:  The patient was given information about Chronic Care Management services, agreed to services, and gave verbal consent prior to initiation of services.  Please see initial visit note for detailed documentation.   Patient Care Team: Steele Sizer, MD as PCP - General (Family Medicine) Nadene Rubins, DO as Referring Physician (Physical Medicine and Rehabilitation) Germaine Pomfret, [facility] as Pharmacist (Pharmacist)  Recent office visits: 08/12/20: Patient presented to Dr. Ancil Boozer for follow-up. BP 144/92. Losartan increased to 100 mg daily. Patient started on Lyric 50 mg TID.   Recent consult visits: 08/03/20: Patient presented to Dr. Nehemiah Massed (Dermatology)   Hospital visits: Medication Reconciliation was completed by comparing discharge summary, patients EMR and Pharmacy list, and upon discussion with patient.   Admitted to the hospital on 09/13/2020 due to COPD exacerbation. Discharge date was 09/15/2020. Discharged from [place]?Medications Started at [facility] Discharge:?? -started Amlodipine 5 mg due to Hypertension elevated -Started Nicotine 21 mg patch Medication Changes at Hospital Discharge: -Changed none   Medications Discontinued at Hospital  Discharge: -Stopped Celecoxib 200 mg    Medications that remain the same after Hospital Discharge:??  -All other medications will remain the same.  Objective:  Lab Results  Component Value Date   CREATININE 0.84 03/11/2021   BUN 13 03/11/2021   GFRNONAA >60 02/24/2021   GFRAA 104 02/21/2020   NA 139 03/11/2021   K 5.1 03/11/2021   CALCIUM 9.0 03/11/2021   CO2 19 (L) 03/11/2021   GLUCOSE 76 03/11/2021    No results found for: HGBA1C, FRUCTOSAMINE, GFR, MICROALBUR  Last diabetic Eye exam: No results found for: HMDIABEYEEXA  Last diabetic Foot exam: No results found for: HMDIABFOOTEX   Lab Results  Component Value Date   CHOL 173 02/21/2020   HDL 101 02/21/2020   LDLCALC 61 02/21/2020   LDLDIRECT 45.5 02/25/2021   TRIG 39 02/21/2020   CHOLHDL 1.7 02/21/2020    Hepatic Function Latest Ref Rng & Units 02/19/2021 02/02/2021 02/21/2020  Total Protein 6.5 - 8.1 g/dL 6.8 7.8 6.9  Albumin 3.5 - 5.0 g/dL 3.7 4.2 -  AST 15 - 41 U/L 59(H) 41 22  ALT 0 - 44 U/L 52(H) 27 19  Alk Phosphatase 38 - 126 U/L 55 66 -  Total Bilirubin 0.3 - 1.2 mg/dL 0.4 0.3 0.5    No results found for: TSH, FREET4  CBC Latest Ref Rng & Units 03/11/2021 02/23/2021 02/22/2021  WBC 3.4 - 10.8 x10E3/uL 8.1 9.4 12.6(H)  Hemoglobin 11.1 - 15.9 g/dL 11.9 12.4 12.5  Hematocrit 34.0 - 46.6 % 35.6 37.8 38.4  Platelets 150 - 450 x10E3/uL 220 210 245    No results found for: VD25OH  Clinical ASCVD: Yes  The ASCVD Risk score (Arnett DK, et al., 2019) failed to calculate  for the following reasons:   The patient has a prior MI or stroke diagnosis    Depression screen [facility] 2/9 04/02/2021 03/30/2021 03/08/2021  Decreased Interest 0 0 1  Down, Depressed, Hopeless 0 0 0  PHQ - 2 Score 0 0 1  Altered sleeping 1 0 1  Tired, decreased energy 1 0 1  Change in appetite 1 0 0  Feeling bad or failure about yourself  0 0 0  Trouble concentrating 0 0 1  Moving slowly or fidgety/restless 0 0 0  Suicidal thoughts 0 0 0   PHQ-9 Score 3 0 4  Difficult doing work/chores Not difficult at all Not difficult at all Not difficult at all  Some recent data might be hidden    Social History   Tobacco Use  Smoking Status Every Day   Packs/day: 2.00   Years: 50.00   Pack years: 100.00   Types: Cigarettes   Start date: 12/10/1973  Smokeless Tobacco Never  Tobacco Comments   1-2 ciggs per day - 04/13/2021   BP Readings from Last 3 Encounters:  06/15/21 (!) 150/90  04/16/21 130/78  04/13/21 122/82   Pulse Readings from Last 3 Encounters:  06/15/21 70  04/16/21 72  04/13/21 73   Wt Readings from Last 3 Encounters:  06/15/21 133 lb (60.3 kg)  04/16/21 134 lb (60.8 kg)  04/13/21 134 lb 12.8 oz (61.1 kg)   BMI Readings from Last 3 Encounters:  06/15/21 25.97 kg/m  04/16/21 26.17 kg/m  04/13/21 26.33 kg/m    Assessment/Interventions: Review of patient past medical history, allergies, medications, health status, including review of consultants reports, laboratory and other test data, was performed as part of comprehensive evaluation and provision of chronic care management services.   SDOH:  (Social Determinants of Health) assessments and interventions performed: Yes   SDOH Screenings   Alcohol Screen: Not on file  Depression (PHQ2-9): Low Risk    PHQ-2 Score: 3  Financial Resource Strain: Low Risk    Difficulty of Paying Living Expenses: Not hard at all  Food Insecurity: No Food Insecurity   Worried About Charity fundraiser in the Last Year: Never true   Ran Out of Food in the Last Year: Never true  Housing: Low Risk    Last Housing Risk Score: 0  Physical Activity: Inactive   Days of Exercise per Week: 0 days   Minutes of Exercise per Session: 0 min  Social Connections: Moderately Integrated   Frequency of Communication with Friends and Family: More than three times a week   Frequency of Social Gatherings with Friends and Family: More than three times a week   Attends Religious Services:  More than 4 times per year   Active Member of [employer] or Organizations: Yes   Attends Archivist Meetings: More than 4 times per year   Marital Status: Widowed  Stress: No Stress Concern Present   Feeling of Stress : Not at all  Tobacco Use: High Risk   Smoking Tobacco Use: Every Day   Smokeless Tobacco Use: Never   Passive Exposure: Not on file  Transportation Needs: No Transportation Needs   Lack of Transportation (Medical): No   Lack of Transportation (Non-Medical): No    CCM Care Plan  Allergies  Allergen Reactions   Levofloxacin Shortness Of Breath   Penicillins Hives    itching   Meloxicam     dizziness   Nsaids Other (See Comments)    Patient states she can tolerate  up to three doses per day without incident    Medications Reviewed Today     Reviewed by Antony Madura, PA-C (Physician Assistant Certified) on 89/37/34 at [place] List Status: <None>   Medication Order Taking? Sig Documenting Provider Last Dose Status Informant  albuterol (PROVENTIL) (2.5 MG/3ML) 0.083% nebulizer solution [id number] Yes Take 3 mLs (2.5 mg total) by nebulization every 4 (four) hours as needed for wheezing or shortness of breath. Tanda Rockers, MD Taking Active   albuterol (VENTOLIN HFA) 108 (90 Base) MCG/ACT inhaler [id number] Yes Inhale 1-2 puffs into the lungs every 6 (six) hours as needed for wheezing or shortness of breath. Myles Gip, DO Taking Active   ALPRAZolam Duanne Moron) 0.25 MG tablet [id number] Yes Take 1 tablet (0.25 mg total) by mouth 2 (two) times daily as needed for anxiety. Ezekiel Slocumb, DO Taking Active   aspirin 81 MG EC tablet [id number] Yes TAKE 1 TABLET BY MOUTH EVERY DAY Steele Sizer, MD Taking Active Self  atorvastatin (LIPITOR) 80 MG tablet [id number] Yes Take 1 tablet (80 mg total) by mouth daily. Steele Sizer, MD Taking Active   blood glucose meter kit and supplies KIT [id number] Yes Dispense based on patient and insurance preference. Use up to  four times daily as directed. Ezekiel Slocumb, DO Taking Active   citalopram (CELEXA) 20 MG tablet [id number] Yes Take 1.5 tablets (30 mg total) by mouth every evening. Steele Sizer, MD Taking Active   fluticasone Asencion Islam) 50 MCG/ACT nasal spray [id number] Yes PLACE TWO SPRAYS into BOTH nostrils DAILY Steele Sizer, MD Taking Active   Fluticasone-Umeclidin-Vilant (TRELEGY ELLIPTA) 100-62.5-25 MCG/INH AEPB [id number] Yes Inhale 1 puff into the lungs daily. Steele Sizer, MD Taking Active   isosorbide mononitrate (IMDUR) 30 MG 24 hr tablet [id number] Yes Take 0.5 tablets (15 mg total) by mouth daily. Nicole Kindred A, DO Taking Active   loratadine (ALLERGY RELIEF) 10 MG tablet [id number] Yes Take 1 tablet (10 mg total) by mouth daily. Steele Sizer, MD Taking Active   losartan (COZAAR) 100 MG tablet [id number] Yes Take 1 tablet (100 mg total) by mouth every evening. Steele Sizer, MD Taking Active   metoprolol succinate (TOPROL-XL) 25 MG 24 hr tablet [id number] Yes Take 1 tablet (25 mg total) by mouth every evening. Steele Sizer, MD Taking Active   NARCAN 4 MG/0.1ML LIQD nasal spray kit [id number] Yes 1 spray as needed. As needed in case of overdose [provider] Taking Active   pantoprazole (PROTONIX) 40 MG tablet [id number] Yes Take 1 tablet (40 mg total) by mouth every evening. Steele Sizer, MD Taking Active   ticagrelor (BRILINTA) 90 MG TABS tablet [id number] Yes Take 1 tablet (90 mg total) by mouth 2 (two) times daily. 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 /  group D  05/12/2015   Degeneration of intervertebral disc of cervical region 05/12/2015   GERD (gastroesophageal reflux disease) 05/12/2015   Major depression in remission ([place]) 05/12/2015    Immunization History  Administered Date(s) Administered   Fluad Quad(high Dose 65+) 03/13/2019, 02/21/2020, 03/08/2021   Influenza, High Dose Seasonal PF 03/29/2017   Influenza, Seasonal, Injecte, Preservative Fre 03/26/2012   Influenza,inj,Quad PF,6+ Mos 03/18/2013, 05/12/2015, 05/09/2016   Moderna Sars-Covid-2 Vaccination 10/17/2019, 11/14/2019   Pneumococcal Conjugate-13 08/30/2018   Pneumococcal Polysaccharide-23 03/08/2010, 06/09/2013, 02/21/2020   Tdap 05/27/2013    Conditions to be addressed/monitored:  Hypertension, Hyperlipidemia, Coronary Artery Disease, GERD, COPD, Depression and Tobacco use  Care Plan : General Pharmacy (Adult)  Updates made by Germaine Pomfret, RPH since 07/08/2021 12:00 AM     Problem: Hypertension, Hyperlipidemia, Coronary Artery Disease, GERD, COPD, Depression and Tobacco use   Priority: High     Long-Range Goal: Patient-Specific Goal   Start Date: 09/30/2020  Expected End Date: 07/08/2022  This Visit's Progress: On track  Recent Progress: On track  Priority: High  Note:   Current Barriers:  Unable to maintain control of hypertension Unable to quit smoking  Pharmacist Clinical Goal(s):  Patient will achieve control of blood pressure as evidenced by BP less than 140/90 Quit smoking through collaboration with PharmD and provider.   Interventions: 1:1 collaboration with Steele Sizer, MD regarding development and update of comprehensive plan of care as evidenced by provider attestation and  co-signature Inter-disciplinary care team collaboration (see longitudinal plan of care) Comprehensive medication review performed; medication list updated in electronic medical record  Hypertension (BP goal <140/90) -Not ideally controlled -Current treatment: Amlodipine 5 mg daily  Losartan 100 mg daily  Metoprolol XL 25 mg daily  -Medications previously tried: NA  -Current home readings:   100/60  120/70 -Denies hypotensive/hypertensive symptoms -Counseled to monitor BP at home weekly, document, and provide log at future appointments -Recommended to continue current medication  Hyperlipidemia: (LDL goal < 70) -Controlled -Current treatment: Atorvastatin 20 mg daily  -Current antiplatelet treatment: Aspirin 81 mg daily  -Medications previously tried: NA  -Recommended to continue current medication  COPD (Goal: control symptoms and prevent exacerbations) -Uncontrolled -Current treatment  Albuterol 1-2 puffs every 6 hours as needed: Appropriate, Effective, Safe, Accessible Trelegy 1 puff daily: Appropriate, Effective, Safe, Accessible Duoneb: Appropriate, Effective, Safe, Accessible -Medications previously tried: NA  -Gold Grade: NA -Current COPD Classification:  D (high sx, >/=2 exacerbations/yr) -CAT score: 12 (Apr 2022) -Exacerbations requiring treatment in last 6 months: Hospitalized 09/13/20  -Patient reports consistent use of maintenance inhaler -Frequency of rescue inhaler use: nebulizer 3x daily, Albuterol sporadically when out of the home.  -Counseled on Proper inhaler technique; -Recommended to continue current medication  Tobacco use (Goal Quit smoking) -Uncontrolled -Previous quit attempts: Nicotine patch  -Current treatment  None -Patient smokes After 30 minutes of waking -Patient triggers include: drinking coffee, stressed,  - Currently smoking  2-3 cigarettes daily. She is no longer using the nicotine patch and never started the nicotine gum. She still  struggles with cravings, although she has identified triggers than tend to lead her to smoking and is trying to develop strategies to manage her cravings.   Depression/Anxiety (Goal: Maintain symptom remission) -Controlled -Current treatment: Citalopram 20 mg daily  -Medications previously tried/failed: NA -PHQ9: 2 -Could consider Wellbutrin SR trial to help with smoking cessation.  -Recommended to continue current medication  Patient Goals/Self-Care Activities Patient will:  - check blood pressure 2-3 times weekly,  document, and provide at future appointments  Follow Up Plan: Telephone follow up appointment with care management team member scheduled for:  01/05/2022 at 3:00 PM       Medication Assistance: None required.  Patient affirms current coverage meets needs.  Patient's preferred pharmacy is:  [facility] - [place], [place] - [address] [place] [address] Phone: [phone] Fax: [phone]  [id number] [place] [address]Phone: [phone]: [phone]  Patient decided to: Utilize [facility] for medication synchronization, packaging and delivery  Reviewed chart for medication changes ahead of medication coordination call.  No OVs, Consults, or hospital visits since last care coordination call/Pharmacist visit. (If appropriate, list visit date, provider name)  No medication changes indicated OR if recent visit, treatment plan here.  BP Readings from Last 3 Encounters:  06/15/21 (!) 150/90  04/16/21 130/78  04/13/21 122/82    No results found for: HGBA1C   Patient obtains medications through Adherence Packaging  30 Days   Last adherence delivery included: (medication name and frequency) Brilinta 90 mg 1 tablet twice daily (Breakfast, Bedtime) Atorvastatin 80 mg 1 tablet daily (Breakfast) Isosorbide Mononitrate 30 mg  Take 1/2 tablet daily (Breakfast) Trelegy Ellipta 100-62.5-25 mcg inhale 1 puff into lungs daily Metoprolol Succinate 25 mg 1 tablet daily (Evening Meal) Losartan 100 mg 1 tablet daily (Evening Meal) Pantoprazole 40 mg 1 tablet daily (Evening Meal) Loratadine 10 mg 1 tablet daily (Breakfast) Citalopram 20 mg 1.5 tablets daily (Evening Meal)  Patient is due for next adherence delivery on: 07/19/21. Called patient and reviewed medications and coordinated delivery.  This delivery to include: Brilinta 90 mg 1 tablet twice daily (Breakfast, Bedtime) Atorvastatin 80 mg 1 tablet daily (Breakfast) Isosorbide Mononitrate 30 mg Take 1/2 tablet daily (Breakfast) Trelegy Ellipta 100-62.5-25 mcg inhale 1 puff into lungs daily Metoprolol Succinate 25 mg 1 tablet daily (Evening Meal) Losartan 100 mg 1 tablet daily (Evening Meal) Pantoprazole 40 mg 1 tablet daily (Evening Meal) Loratadine 10 mg 1 tablet daily (Breakfast) Citalopram 20 mg 1.5 tablets daily (Evening Meal)  Patient will need a short fill of Tramadol 50 mg, prior to adherence delivery. She will need this medication delivered on 07/09/2021.   Confirmed delivery date of 07/19/21, advised patient that pharmacy will contact them the morning of delivery.   Care Plan and Follow Up Patient Decision:  Patient agrees to Care Plan and Follow-up.  Plan: Telephone follow up appointment with care management team member scheduled for:  01/05/2022 at 3:00 PM  AMalva Limes CSouthwood AcresPharmacist Practitioner  [facility] [phone]

## 2021-07-07 NOTE — Progress Notes (Deleted)
Name: Terri Burns   MRN: [id number]    DOB: [birth year]   Date:07/07/2021       Progress Note  Subjective  Chief Complaint  Follow Up  HPI  Chronic neck pain: she has been on disability since 2007,   had x-ray and MRI done, she has DDD.  She fell March 2020 and fracture her c-spine at C2 level, she went to [facility] but she fell down again a few days later and had to be admitted because of severe hypotension , hypoxia and had COPD exacerbation. She continues to have constant  neck pain, she was able to wean self off  neck colar in 2022 , she was on Celebrex and was doing well, however had  to stop all NSAID's due to recent MI and is now in significant pain Pain is described as constant dull and aching , sometimes shoots up to the occipital area and at times it is severe. Right now her pain s 5/10, but at the end of the day is worse She was taking Tramadol upon discharge from [facility] after MI and it helped.   Senile purpura: taking Brilinta since 02/2021 when she had a NSTEMI, reassurance given   COPD: recently seen by Dr. Ky Barban and is now taking prednisone , SOB is better, mild cough but not productive.   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 / group D  05/12/2015   Degeneration of intervertebral disc of cervical region 05/12/2015   GERD (gastroesophageal reflux disease) 05/12/2015   Major depression in remission ([place]) 05/12/2015    Past Surgical History:  Procedure Laterality Date   CORONARY  STENT INTERVENTION N/A 02/22/2021   Procedure: CORONARY STENT INTERVENTION;  Surgeon: Wellington Hampshire, MD;  Location: [place] [facility];  Service: Cardiovascular;  Laterality: N/A;   LEFT HEART CATH AND CORONARY ANGIOGRAPHY N/A 02/22/2021   Procedure: LEFT HEART CATH AND CORONARY ANGIOGRAPHY;  Surgeon: Wellington Hampshire, MD;  Location: [place] [facility];  Service: Cardiovascular;  Laterality: N/A;   NECK SURGERY N/A 10/2003   SHOULDER SURGERY Left 2004    Family History  Problem Relation Age of Onset   Hypertension Mother    Stroke Mother    Heart disease Father    Diabetes Father    Heart disease Daughter    Pulmonary embolism Daughter    Hypertension Son     Social History   Tobacco Use   Smoking status: Every Day    Packs/day: 2.00    Years: 50.00    Pack years: 100.00    Types: Cigarettes    Start date: 12/10/1973   Smokeless tobacco: Never   Tobacco comments:    1-2 ciggs per day - 04/13/2021  Substance Use Topics   Alcohol use: Yes    Alcohol/week: 0.0 standard drinks    Comment: occasionally     Current Outpatient Medications:    albuterol (PROVENTIL) (2.5 MG/3ML) 0.083% nebulizer solution, Take 3 mLs (2.5 mg total) by nebulization every 4 (four) hours  as needed for wheezing or shortness of breath., Disp: 75 mL, Rfl: 12   albuterol (VENTOLIN HFA) 108 (90 Base) MCG/ACT inhaler, Inhale 1-2 puffs into the lungs every 6 (six) hours as needed for wheezing or shortness of breath., Disp: 1 each, Rfl: 2   ALPRAZolam (XANAX) 0.25 MG tablet, Take 1 tablet (0.25 mg total) by mouth 2 (two) times daily as needed for anxiety., Disp: 30 tablet, Rfl: 0   aspirin 81 MG EC tablet, TAKE 1 TABLET BY MOUTH EVERY DAY, Disp: 90 tablet, Rfl: 0   atorvastatin (LIPITOR) 80 MG tablet, Take 1 tablet (80 mg total) by mouth daily., Disp: 90 tablet, Rfl: 1   blood glucose meter kit and supplies KIT, Dispense based on patient and insurance preference. Use up to four times daily as directed.,  Disp: 1 each, Rfl: 0   citalopram (CELEXA) 20 MG tablet, Take 1.5 tablets (30 mg total) by mouth every evening., Disp: 135 tablet, Rfl: 1   fluticasone (FLONASE) 50 MCG/ACT nasal spray, PLACE TWO SPRAYS into BOTH nostrils DAILY, Disp: 48 g, Rfl: 0   Fluticasone-Umeclidin-Vilant (TRELEGY ELLIPTA) 100-62.5-25 MCG/INH AEPB, Inhale 1 puff into the lungs daily., Disp: 60 each, Rfl: 3   isosorbide mononitrate (IMDUR) 30 MG 24 hr tablet, Take 0.5 tablets (15 mg total) by mouth daily., Disp: 30 tablet, Rfl: 2   loratadine (ALLERGY RELIEF) 10 MG tablet, Take 1 tablet (10 mg total) by mouth daily., Disp: 90 tablet, Rfl: 1   losartan (COZAAR) 100 MG tablet, Take 1 tablet (100 mg total) by mouth every evening., Disp: 90 tablet, Rfl: 1   metoprolol succinate (TOPROL-XL) 25 MG 24 hr tablet, Take 1 tablet (25 mg total) by mouth every evening., Disp: 90 tablet, Rfl: 1   NARCAN 4 MG/0.1ML LIQD nasal spray kit, 1 spray as needed. As needed in case of overdose, Disp: , Rfl:    pantoprazole (PROTONIX) 40 MG tablet, Take 1 tablet (40 mg total) by mouth every evening., Disp: 90 tablet, Rfl: 1   ticagrelor (BRILINTA) 90 MG TABS tablet, Take 1 tablet (90 mg total) by mouth 2 (two) times daily., Disp: 60 tablet, Rfl: 5   traMADol (ULTRAM) 50 MG tablet, TAKE ONE TABLET BY MOUTH twice daily, Disp: 60 tablet, Rfl: 0  Allergies  Allergen Reactions   Levofloxacin Shortness Of Breath   Penicillins Hives    itching   Meloxicam     dizziness   Nsaids Other (See Comments)    Patient states she can tolerate up to three doses per day without incident    I personally reviewed active problem list, medication list, allergies, family history, social history, health maintenance with the patient/caregiver today.   ROS  ***  Objective  There were no vitals filed for this visit.  There is no height or weight on file to calculate BMI.  Physical Exam ***  Recent Results (from the past 2160 hour(s))  ECHOCARDIOGRAM  COMPLETE     Status: None   Collection Time: 04/13/21  3:40 PM  Result Value Ref Range   Weight 2,156.8 oz   Height 60 in   BP 122/82 mmHg   AR max vel 2.75 cm2   AV Peak grad 5.8 mmHg   Ao pk vel 1.20 m/s   S' Lateral 2.10 cm   Area-P 1/2 2.80 cm2   AV Area VTI 2.83 cm2   AV Mean grad 4.0 mmHg   Single Plane A4C EF 64.7 %   Single Plane A2C EF 75.0 %  Calc EF 69.5 %   AV Area mean vel 2.73 cm2  Brain natriuretic peptide     Status: Abnormal   Collection Time: 04/16/21  3:45 PM  Result Value Ref Range   BNP 156.0 (H) 0.0 - 100.0 pg/mL     PHQ2/9: Depression screen [facility] 2/9 04/02/2021 03/30/2021 03/08/2021 08/12/2020 07/09/2020  Decreased Interest 0 0 1 1 0  Down, Depressed, Hopeless 0 0 0 0 0  PHQ - 2 Score 0 0 1 1 0  Altered sleeping 1 0 1 0 -  Tired, decreased energy 1 0 1 1 -  Change in appetite 1 0 0 0 -  Feeling bad or failure about yourself  0 0 0 0 -  Trouble concentrating 0 0 1 0 -  Moving slowly or fidgety/restless 0 0 0 0 -  Suicidal thoughts 0 0 0 0 -  PHQ-9 Score 3 0 4 2 -  Difficult doing work/chores Not difficult at all Not difficult at all Not difficult at all - -  Some recent data might be hidden    phq 9 is {gen pos [id number]}   Fall Risk: Fall Risk  04/02/2021 03/30/2021 08/12/2020 07/09/2020 02/21/2020  Falls in the past year? 0 0 0 0 0  Number falls in past yr: - 0 0 0 0  Injury with Fall? - 0 0 0 0  Comment - - - - -  Risk for fall due to : - No Fall Risks - Impaired balance/gait -  Risk for fall due to: Comment - - - - -  Follow up Falls prevention discussed Falls prevention discussed - Falls prevention discussed -      Functional Status Survey:      Assessment & Plan  *** There are no diagnoses linked to this encounter.

## 2021-07-08 ENCOUNTER — Ambulatory Visit: Payer: Medicare Other | Admitting: Family Medicine

## 2021-07-08 ENCOUNTER — Other Ambulatory Visit: Payer: Self-pay | Admitting: Family Medicine

## 2021-07-08 DIAGNOSIS — G8929 Other chronic pain: Secondary | ICD-10-CM

## 2021-07-08 NOTE — Patient Instructions (Signed)
Visit Information It was great speaking with you today!  Please let me know if you have any questions about our visit.   Goals Addressed             This Visit's Progress    Quit smoking / using tobacco   Not on track    Recommend to attend smoking cessation classes with [facility] to discuss smoking cessation     Track and Manage My Triggers-COPD   On track    Timeframe:  Long-Range Goal Priority:  High Start Date:  09/30/2020                           Expected End Date:  04/01/2021                     Follow Up within 90 days   - avoid second hand smoke - eliminate smoking in my home - identify and remove indoor air pollutants - limit outdoor activity during cold weather    Why is this important?   Triggers are activities or things, like tobacco smoke or cold weather, that make your COPD (chronic obstructive pulmonary disease) flare-up.  Knowing these triggers helps you plan how to stay away from them.  When you cannot remove them, you can learn how to manage them.     Notes:         Patient Care Plan: General Pharmacy (Adult)     Problem Identified: Hypertension, Hyperlipidemia, Coronary Artery Disease, GERD, COPD, Depression and Tobacco use   Priority: High     Long-Range Goal: Patient-Specific Goal   Start Date: 09/30/2020  Expected End Date: 07/08/2022  This Visit's Progress: On track  Recent Progress: On track  Priority: High  Note:   Current Barriers:  Unable to maintain control of hypertension Unable to quit smoking  Pharmacist Clinical Goal(s):  Patient will achieve control of blood pressure as evidenced by BP less than 140/90 Quit smoking through collaboration with PharmD and provider.   Interventions: 1:1 collaboration with Steele Sizer, MD regarding development and update of comprehensive plan of care as evidenced by provider attestation and co-signature Inter-disciplinary care team collaboration (see longitudinal plan of care) Comprehensive  medication review performed; medication list updated in electronic medical record  Hypertension (BP goal <140/90) -Not ideally controlled -Current treatment: Amlodipine 5 mg daily  Losartan 100 mg daily  Metoprolol XL 25 mg daily  -Medications previously tried: NA  -Current home readings:   100/60  120/70 -Denies hypotensive/hypertensive symptoms -Counseled to monitor BP at home weekly, document, and provide log at future appointments -Recommended to continue current medication  Hyperlipidemia: (LDL goal < 70) -Controlled -Current treatment: Atorvastatin 20 mg daily  -Current antiplatelet treatment: Aspirin 81 mg daily  -Medications previously tried: NA  -Recommended to continue current medication  COPD (Goal: control symptoms and prevent exacerbations) -Uncontrolled -Current treatment  Albuterol 1-2 puffs every 6 hours as needed: Appropriate, Effective, Safe, Accessible Trelegy 1 puff daily: Appropriate, Effective, Safe, Accessible Duoneb: Appropriate, Effective, Safe, Accessible -Medications previously tried: NA  -Gold Grade: NA -Current COPD Classification:  D (high sx, >/=2 exacerbations/yr) -CAT score: 12 (Apr 2022) -Exacerbations requiring treatment in last 6 months: Hospitalized 09/13/20  -Patient reports consistent use of maintenance inhaler -Frequency of rescue inhaler use: nebulizer 3x daily, Albuterol sporadically when out of the home.  -Counseled on Proper inhaler technique; -Recommended to continue current medication  Tobacco use (Goal Quit smoking) -Uncontrolled -Previous  quit attempts: Nicotine patch  -Current treatment  None -Patient smokes After 30 minutes of waking -Patient triggers include: drinking coffee, stressed,  - Currently smoking  2-3 cigarettes daily. She is no longer using the nicotine patch and never started the nicotine gum. She still struggles with cravings, although she has identified triggers than tend to lead her to smoking and is  trying to develop strategies to manage her cravings.   Depression/Anxiety (Goal: Maintain symptom remission) -Controlled -Current treatment: Citalopram 20 mg daily  -Medications previously tried/failed: NA -PHQ9: 2 -Could consider Wellbutrin SR trial to help with smoking cessation.  -Recommended to continue current medication  Patient Goals/Self-Care Activities Patient will:  - check blood pressure 2-3 times weekly, document, and provide at future appointments  Follow Up Plan: Telephone follow up appointment with care management team member scheduled for:  01/05/2022 at 3:00 PM      Patient agreed to services and verbal consent obtained.   Patient verbalizes understanding of instructions and care plan provided today and agrees to view in [place]. Active MyChart status confirmed with patient.    Malva Limes, [place] Pharmacist Practitioner  [facility] [phone]

## 2021-07-08 NOTE — Telephone Encounter (Signed)
[name] calling back to follow up on refill request for traMADol (ULTRAM) 50 MG tablet

## 2021-07-13 ENCOUNTER — Ambulatory Visit (INDEPENDENT_AMBULATORY_CARE_PROVIDER_SITE_OTHER): Payer: Medicare Other | Admitting: Family Medicine

## 2021-07-13 ENCOUNTER — Ambulatory Visit (INDEPENDENT_AMBULATORY_CARE_PROVIDER_SITE_OTHER): Payer: Medicare Other

## 2021-07-13 ENCOUNTER — Encounter: Payer: Self-pay | Admitting: Family Medicine

## 2021-07-13 VITALS — BP 138/80 | HR 70 | Temp 98.2°F | Resp 15 | Ht 60.0 in | Wt 129.5 lb

## 2021-07-13 VITALS — BP 138/80 | HR 78 | Resp 16 | Ht 60.0 in | Wt 129.0 lb

## 2021-07-13 DIAGNOSIS — I10 Essential (primary) hypertension: Secondary | ICD-10-CM | POA: Diagnosis not present

## 2021-07-13 DIAGNOSIS — Z135 Encounter for screening for eye and ear disorders: Secondary | ICD-10-CM | POA: Diagnosis not present

## 2021-07-13 DIAGNOSIS — F330 Major depressive disorder, recurrent, mild: Secondary | ICD-10-CM

## 2021-07-13 DIAGNOSIS — Z0100 Encounter for examination of eyes and vision without abnormal findings: Secondary | ICD-10-CM

## 2021-07-13 DIAGNOSIS — D692 Other nonthrombocytopenic purpura: Secondary | ICD-10-CM

## 2021-07-13 DIAGNOSIS — F3342 Major depressive disorder, recurrent, in full remission: Secondary | ICD-10-CM

## 2021-07-13 DIAGNOSIS — G8929 Other chronic pain: Secondary | ICD-10-CM

## 2021-07-13 DIAGNOSIS — J449 Chronic obstructive pulmonary disease, unspecified: Secondary | ICD-10-CM | POA: Diagnosis not present

## 2021-07-13 DIAGNOSIS — Z748 Other problems related to care provider dependency: Secondary | ICD-10-CM

## 2021-07-13 DIAGNOSIS — M542 Cervicalgia: Secondary | ICD-10-CM

## 2021-07-13 DIAGNOSIS — J432 Centrilobular emphysema: Secondary | ICD-10-CM

## 2021-07-13 DIAGNOSIS — F1190 Opioid use, unspecified, uncomplicated: Secondary | ICD-10-CM

## 2021-07-13 DIAGNOSIS — L292 Pruritus vulvae: Secondary | ICD-10-CM

## 2021-07-13 DIAGNOSIS — Z0000 Encounter for general adult medical examination without abnormal findings: Secondary | ICD-10-CM

## 2021-07-13 DIAGNOSIS — I700 Atherosclerosis of aorta: Secondary | ICD-10-CM

## 2021-07-13 DIAGNOSIS — Z122 Encounter for screening for malignant neoplasm of respiratory organs: Secondary | ICD-10-CM

## 2021-07-13 MED ORDER — FLUCONAZOLE 150 MG PO TABS: 150.0000 mg | ORAL_TABLET | ORAL | 0 refills | Status: DC

## 2021-07-13 MED ORDER — TRAMADOL HCL 50 MG PO TABS: 50.0000 mg | ORAL_TABLET | Freq: Two times a day (BID) | ORAL | 2 refills | Status: DC

## 2021-07-13 NOTE — Patient Instructions (Signed)
[name] , Thank you for taking time to come for your Medicare Wellness Visit. I appreciate your ongoing commitment to your health goals. Please review the following plan we discussed and let me know if I can assist you in the future.   Screening recommendations/referrals: Colonoscopy: Cologuard ordered 03/08/21. Please complete and send in.  Mammogram: Please call [phone] to schedule your mammogram and bone density screening.  Bone Density: due Recommended yearly ophthalmology/optometry visit for glaucoma screening and checkup Recommended yearly dental visit for hygiene and checkup  Vaccinations: Influenza vaccine: done 03/08/21 Pneumococcal vaccine: done 02/21/20 Tdap vaccine: done 05/27/13 Shingles vaccine: Shingrix discussed. Please contact your pharmacy for coverage information.  Covid-19: done 10/17/19, 11/14/19, 10/22/20 & 05/11/21  Advanced directives: Please bring a copy of your health care power of attorney and living will to the office at your convenience once you have completed those documents.   Conditions/risks identified: If you wish to quit smoking, help is available. For free tobacco cessation program offerings call the [facility] at [phone] or Live Well Line at 202-246-1063. You may also visit [email or web address] or email [email or web address] for more information on other programs.   Next appointment: Follow up in one year for your annual wellness visit    Preventive Care 65 Years and Older, Female Preventive care refers to lifestyle choices and visits with your health care provider that can promote health and wellness. What does preventive care include? A yearly physical exam. This is also called an annual well check. Dental exams once or twice a year. Routine eye exams. Ask your health care provider how often you should have your eyes checked. Personal lifestyle choices, including: Daily care of your teeth and gums. Regular physical  activity. Eating a healthy diet. Avoiding tobacco and drug use. Limiting alcohol use. Practicing safe sex. Taking low-dose aspirin every day. Taking vitamin and mineral supplements as recommended by your health care provider. What happens during an annual well check? The services and screenings done by your health care provider during your annual well check will depend on your age, overall health, lifestyle risk factors, and family history of disease. Counseling  Your health care provider may ask you questions about your: Alcohol use. Tobacco use. Drug use. Emotional well-being. Home and relationship well-being. Sexual activity. Eating habits. History of falls. Memory and ability to understand (cognition). Work and work Statistician. Reproductive health. Screening  You may have the following tests or measurements: Height, weight, and BMI. Blood pressure. Lipid and cholesterol levels. These may be checked every 5 years, or more frequently if you are over 17 years old. Skin check. Lung cancer screening. You may have this screening every year starting at age 39 if you have a 30-pack-year history of smoking and currently smoke or have quit within the past 15 years. Fecal occult blood test (FOBT) of the stool. You may have this test every year starting at age 71. Flexible sigmoidoscopy or colonoscopy. You may have a sigmoidoscopy every 5 years or a colonoscopy every 10 years starting at age 52. Hepatitis C blood test. Hepatitis B blood test. Sexually transmitted disease (STD) testing. Diabetes screening. This is done by checking your blood sugar (glucose) after you have not eaten for a while (fasting). You may have this done every 1-3 years. Bone density scan. This is done to screen for osteoporosis. You may have this done starting at age 68. Mammogram. This may be done every 1-2 years. Talk to your health care provider about how  often you should have regular mammograms. Talk with your  health care provider about your test results, treatment options, and if necessary, the need for more tests. Vaccines  Your health care provider may recommend certain vaccines, such as: Influenza vaccine. This is recommended every year. Tetanus, diphtheria, and acellular pertussis (Tdap, Td) vaccine. You may need a Td booster every 10 years. Zoster vaccine. You may need this after age 67. Pneumococcal 13-valent conjugate (PCV13) vaccine. One dose is recommended after age 73. Pneumococcal polysaccharide (PPSV23) vaccine. One dose is recommended after age 54. Talk to your health care provider about which screenings and vaccines you need and how often you need them. This information is not intended to replace advice given to you by your health care provider. Make sure you discuss any questions you have with your health care provider. Document Released: 06/19/2015 Document Revised: 02/10/2016 Document Reviewed: 03/24/2015 Elsevier Interactive Patient Education  2017 [employer] Prevention in the Home Falls can cause injuries. They can happen to people of all ages. There are many things you can do to make your home safe and to help prevent falls. What can I do on the outside of my home? Regularly fix the edges of walkways and driveways and fix any cracks. Remove anything that might make you trip as you walk through a door, such as a raised step or threshold. Trim any bushes or trees on the path to your home. Use bright outdoor lighting. Clear any walking paths of anything that might make someone trip, such as rocks or tools. Regularly check to see if handrails are loose or broken. Make sure that both sides of any steps have handrails. Any raised decks and porches should have guardrails on the edges. Have any leaves, snow, or ice cleared regularly. Use sand or salt on walking paths during winter. Clean up any spills in your garage right away. This includes oil or grease spills. What can I  do in the bathroom? Use night lights. Install grab bars by the toilet and in the tub and shower. Do not use towel bars as grab bars. Use non-skid mats or decals in the tub or shower. If you need to sit down in the shower, use a plastic, non-slip stool. Keep the floor dry. Clean up any water that spills on the floor as soon as it happens. Remove soap buildup in the tub or shower regularly. Attach bath mats securely with double-sided non-slip rug tape. Do not have throw rugs and other things on the floor that can make you trip. What can I do in the bedroom? Use night lights. Make sure that you have a light by your bed that is easy to reach. Do not use any sheets or blankets that are too big for your bed. They should not hang down onto the floor. Have a firm chair that has side arms. You can use this for support while you get dressed. Do not have throw rugs and other things on the floor that can make you trip. What can I do in the kitchen? Clean up any spills right away. Avoid walking on wet floors. Keep items that you use a lot in easy-to-reach places. If you need to reach something above you, use a strong step stool that has a grab bar. Keep electrical cords out of the way. Do not use floor polish or wax that makes floors slippery. If you must use wax, use non-skid floor wax. Do not have throw rugs and other things  on the floor that can make you trip. What can I do with my stairs? Do not leave any items on the stairs. Make sure that there are handrails on both sides of the stairs and use them. Fix handrails that are broken or loose. Make sure that handrails are as long as the stairways. Check any carpeting to make sure that it is firmly attached to the stairs. Fix any carpet that is loose or worn. Avoid having throw rugs at the top or bottom of the stairs. If you do have throw rugs, attach them to the floor with carpet tape. Make sure that you have a light switch at the top of the stairs  and the bottom of the stairs. If you do not have them, ask someone to add them for you. What else can I do to help prevent falls? Wear shoes that: Do not have high heels. Have rubber bottoms. Are comfortable and fit you well. Are closed at the toe. Do not wear sandals. If you use a stepladder: Make sure that it is fully opened. Do not climb a closed stepladder. Make sure that both sides of the stepladder are locked into place. Ask someone to hold it for you, if possible. Clearly mark and make sure that you can see: Any grab bars or handrails. First and last steps. Where the edge of each step is. Use tools that help you move around (mobility aids) if they are needed. These include: Canes. Walkers. Scooters. Crutches. Turn on the lights when you go into a dark area. Replace any light bulbs as soon as they burn out. Set up your furniture so you have a clear path. Avoid moving your furniture around. If any of your floors are uneven, fix them. If there are any pets around you, be aware of where they are. Review your medicines with your doctor. Some medicines can make you feel dizzy. This can increase your chance of falling. Ask your doctor what other things that you can do to help prevent falls. This information is not intended to replace advice given to you by your health care provider. Make sure you discuss any questions you have with your health care provider. Document Released: 03/19/2009 Document Revised: 10/29/2015 Document Reviewed: 06/27/2014 Elsevier Interactive Patient Education  2017 [employer].
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
## 2021-07-13 NOTE — Progress Notes (Signed)
Subjective:   Terri Burns is a 71 y.o. female who presents for Medicare Annual (Subsequent) preventive examination.  Review of Systems     Cardiac Risk Factors include: advanced age (>13mn, >>81women);dyslipidemia;hypertension;smoking/ tobacco exposure     Objective:    Today's Vitals   07/13/21 1333 07/13/21 1336 07/13/21 1405  BP: (!) 146/82  138/80  Pulse: 70    Resp: 15    Temp: 98.2 F (36.8 C)    TempSrc: Oral    SpO2: 98%    Weight: 129 lb 8 oz (58.7 kg)    Height: 5' (1.524 m)    PainSc:  5     Body mass index is 25.29 kg/m.  Advanced Directives 07/13/2021 02/23/2021 02/19/2021 02/02/2021 02/02/2021 09/13/2020 09/13/2020  Does Patient Have a Medical Advance Directive? _0  No No  Would patient like information on creating a medical advance directive? No - Patient declined No - Patient declined No - Patient declined No - Patient declined - No - Patient declined -    Current Medications (verified) Outpatient Encounter Medications as of 07/13/2021  Medication Sig   albuterol (PROVENTIL) (2.5 MG/3ML) 0.083% nebulizer solution Take 3 mLs (2.5 mg total) by nebulization every 4 (four) hours as needed for wheezing or shortness of breath.   albuterol (VENTOLIN HFA) 108 (90 Base) MCG/ACT inhaler Inhale 1-2 puffs into the lungs every 6 (six) hours as needed for wheezing or shortness of breath.   aspirin 81 MG EC tablet TAKE 1 TABLET BY MOUTH EVERY DAY   atorvastatin (LIPITOR) 80 MG tablet Take 1 tablet (80 mg total) by mouth daily.   citalopram (CELEXA) 20 MG tablet Take 1.5 tablets (30 mg total) by mouth every evening.   fluticasone (FLONASE) 50 MCG/ACT nasal spray PLACE TWO SPRAYS into BOTH nostrils DAILY   Fluticasone-Umeclidin-Vilant (TRELEGY ELLIPTA) 100-62.5-25 MCG/INH AEPB Inhale 1 puff into the lungs daily.   isosorbide mononitrate (IMDUR) 30 MG 24 hr tablet Take 0.5 tablets (15 mg total) by mouth daily.   loratadine (ALLERGY RELIEF) 10 MG tablet Take 1 tablet  (10 mg total) by mouth daily.   losartan (COZAAR) 100 MG tablet Take 1 tablet (100 mg total) by mouth every evening.   metoprolol succinate (TOPROL-XL) 25 MG 24 hr tablet Take 1 tablet (25 mg total) by mouth every evening.   pantoprazole (PROTONIX) 40 MG tablet Take 1 tablet (40 mg total) by mouth every evening.   ticagrelor (BRILINTA) 90 MG TABS tablet Take 1 tablet (90 mg total) by mouth 2 (two) times daily.   traMADol (ULTRAM) 50 MG tablet TAKE ONE TABLET BY MOUTH twice daily   ALPRAZolam (XANAX) 0.25 MG tablet Take 1 tablet (0.25 mg total) by mouth 2 (two) times daily as needed for anxiety. (Patient not taking: Reported on 07/13/2021)   blood glucose meter kit and supplies KIT Dispense based on patient and insurance preference. Use up to four times daily as directed. (Patient not taking: Reported on 07/13/2021)   NARCAN 4 MG/0.1ML LIQD nasal spray kit 1 spray as needed. As needed in case of overdose   No facility-administered encounter medications on file as of 07/13/2021.    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   Past Surgical History:  Procedure Laterality Date   CORONARY STENT INTERVENTION N/A 02/22/2021   Procedure: CORONARY STENT INTERVENTION;  Surgeon: Wellington Hampshire, MD;  Location: [facility] [place] [facility];  Service: Cardiovascular;  Laterality: N/A;   LEFT HEART CATH AND CORONARY ANGIOGRAPHY N/A 02/22/2021   Procedure: LEFT HEART CATH AND CORONARY ANGIOGRAPHY;  Surgeon: Wellington Hampshire, MD;  Location: [facility] [facility];  Service: Cardiovascular;  Laterality: N/A;   NECK SURGERY N/A 10-21-2003    SHOULDER SURGERY Left 2004   Family History  Problem Relation Age of Onset   Hypertension Mother    Stroke Mother    Heart disease Father    Diabetes Father    Heart disease Daughter    Pulmonary embolism Daughter    Hypertension Son    Social History   Socioeconomic History   Marital status: Widowed    Spouse name: Not on file   Number of children: 3   Years of education: Not on file   Highest education level: Some college, no degree  Occupational History   Occupation: disabled     Comment: 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   Her daughter died due to heart issues on Oct 21, 2018 at the age of 33.   Both sons live in town and are helping her out    Social Determinants of Radio broadcast assistant Strain: Low Risk    Difficulty of Paying Living Expenses: Not hard at all  Food Insecurity: No Food Insecurity   Worried About Charity fundraiser in the Last Year: Never true   Arboriculturist in the Last Year: Never true  Transportation Needs: Unmet Transportation Needs   Lack of Transportation (Medical): Yes   Lack of Transportation (Non-Medical): No  Physical Activity: Insufficiently Active   Days of Exercise per Week: 4 days   Minutes of Exercise per Session: 20 min  Stress: No Stress Concern Present   Feeling of Stress : Not at all  Social Connections: Moderately Integrated    Frequency of Communication with Friends and Family: More than three times a week   Frequency of Social Gatherings with Friends and Family: More than three times a week   Attends Religious Services: More than 4 times per year   Active Member of [employer] or Organizations: Yes   Attends Archivist Meetings: More than 4 times per year   Marital Status: Widowed    Tobacco Counseling Ready to quit: Not Answered Counseling given: Not Answered Tobacco comments: 1-2 ciggs per day - 04/13/2021; has a  hx of 2 PPD x 10 years    Clinical Intake:  Pre-visit preparation completed: Yes  Pain : 0-10 Pain Score: 5  Pain Type: Chronic pain Pain Location: Neck Pain Orientation: Posterior Pain Descriptors / Indicators: Aching, Sore Pain Onset: More than a month ago Pain Frequency: Constant     BMI - recorded: 25.29 Nutritional Status: BMI 25 -29 Overweight Nutritional Risks: None Diabetes: No  How often do you need to have someone help you when you read instructions, pamphlets, or other written materials from your doctor or pharmacy?: 1 - Never    Interpreter Needed?: No  Information entered by :: Clemetine Marker LPN   Activities of Daily Living In your present state of health, do you have any difficulty performing the following activities: 07/13/2021 04/02/2021  Hearing? N N  Vision? N Y  Difficulty concentrating or making decisions? N N  Walking or climbing stairs? N Y  Dressing or bathing? N N  Doing errands, shopping? N Y  Comment - -  Conservation officer, nature and eating ? N -  Using the Toilet? N -  In the past six months, have you accidently leaked urine? Y -  Comment wears pads for protection -  Do you have problems with loss of bowel control? N -  Managing your Medications? N -  Managing your Finances? N -  Housekeeping or managing your Housekeeping? N -  Some recent data might be hidden    Patient Care Team: Steele Sizer, MD as PCP - General (Family Medicine) Germaine Pomfret, [facility] as Pharmacist (Pharmacist) Antony Madura, PA-C as Physician Assistant (Cardiology) Tanda Rockers, MD as Consulting Physician (Pulmonary Disease)  Indicate any recent Medical Services you may have received from other than [facility] providers in the past year (date may be approximate).     Assessment:   This is a routine wellness examination for Lavanna.  Hearing/Vision screen Hearing Screening - Comments:: Pt denies hearing difficulty Vision Screening - Comments:: Past due for eye exam; [facility] referral [facility] to [facility]   Dietary issues and exercise activities discussed: Current Exercise Habits: Home exercise routine, Type of exercise: walking, Time (Minutes): 20, Frequency (Times/Week): 4, Weekly Exercise (Minutes/Week): 80, Intensity: Mild, Exercise limited by: orthopedic condition(s)   Goals Addressed             This Visit's Progress    Quit smoking / using tobacco   Not on track    Recommend to attend smoking cessation classes with [facility] to discuss smoking cessation       Depression Screen PHQ 2/9 Scores 07/13/2021 04/02/2021 03/30/2021 03/08/2021 08/12/2020 07/09/2020 02/21/2020  PHQ - 2 Score 0 0 0 1 1 0 0  PHQ- 9 Score - 3 0 4 2 - 1    Fall Risk Fall Risk  07/13/2021 04/02/2021 03/30/2021 08/12/2020 07/09/2020  Falls in the past year? 0 0 0 0 0  Number falls in past yr: 0 - 0 0 0  Injury with Fall? 0 - 0 0 0  Comment - - - - -  Risk for fall due to : No Fall Risks - No Fall Risks - Impaired balance/gait  Risk for fall due to: Comment - - - - -  Follow up Falls prevention discussed Falls prevention discussed Falls prevention discussed - Falls prevention discussed    FALL RISK PREVENTION PERTAINING TO THE HOME:  Any stairs in or around the home? No  If so, are there any without handrails?  No  Home free of loose throw rugs in walkways, pet beds, electrical cords, etc? Yes  Adequate lighting in your home to reduce risk of falls? Yes    ASSISTIVE DEVICES UTILIZED TO PREVENT FALLS:  Life alert? No  Use of a cane, walker or w/c? No  Grab bars in the bathroom? Yes  Shower chair or bench in shower? Yes  Elevated toilet seat or a handicapped toilet? No   TIMED UP AND GO:  Was the test performed? Yes .  Length of time to ambulate 10 feet: 6 sec.   Gait steady and fast without use of assistive device  Cognitive Function: Normal cognitive status assessed by direct observation by this Nurse Health Advisor. No abnormalities found.       6CIT Screen 07/09/2019  What Year? 0 points  What month? 0 points  What time? 0 points  Count back from 20 0 points  Months in reverse 0 points  Repeat phrase 0 points  Total Score 0    Immunizations Immunization History  Administered Date(s) Administered   Fluad Quad(high Dose 65+) 03/13/2019, 02/21/2020, 03/08/2021   Influenza, High Dose Seasonal PF 03/29/2017   Influenza, Seasonal, Injecte, Preservative Fre 03/26/2012   Influenza,inj,Quad PF,6+ Mos 03/18/2013, 05/12/2015, 05/09/2016   Moderna Covid-19 Vaccine Bivalent Booster 64yr & up 05/11/2021   Moderna Sars-Covid-2 Vaccination 10/17/2019, 11/14/2019, 10/22/2020   Pneumococcal Conjugate-13 08/30/2018   Pneumococcal Polysaccharide-23 03/08/2010, 06/09/2013, 02/21/2020   Tdap 05/27/2013    TDAP status: Up to date  Flu Vaccine status: Up to date  Pneumococcal vaccine status: Up to date  Covid-19 vaccine status: Completed vaccines  Qualifies for Shingles Vaccine? Yes   Zostavax completed No   Shingrix Completed?: No.    Education has been provided regarding the importance of this vaccine. Patient has been advised to call insurance company to determine out of pocket expense if they have not yet received this vaccine. Advised may also receive vaccine at local pharmacy or Health Dept. Verbalized acceptance and understanding.  Screening Tests Health Maintenance  Topic Date Due   Zoster Vaccines- Shingrix (1 of 2)  Never done   COLONOSCOPY (Pts 45-487yrInsurance coverage will need to be confirmed)  Never done   MAMMOGRAM  Never done   DEXA SCAN  Never done   COVID-19 Vaccine (4 - Booster for Moderna series) 12/17/2020   TETANUS/TDAP  05/28/2023   Pneumonia Vaccine 6592Years old  Completed   INFLUENZA VACCINE  Completed   Hepatitis C Screening  Completed   HPV VACCINES  Aged Out    Health Maintenance  Health Maintenance Due  Topic Date Due   Zoster Vaccines- Shingrix (1 of 2) Never done   COLONOSCOPY (Pts 45-4924yrnsurance coverage will need to be confirmed)  Never done   MAMMOGRAM  Never done   DEXA SCAN  Never done   COVID-19 Vaccine (4 - Booster for Moderna series) 12/17/2020    Colorectal cancer screening: Cologuard ordered 03/08/21. Advised patient to complete.   Mammogram status: Ordered 03/08/21. Pt provided with contact info and advised to call to schedule appt.   Bone Density status: Ordered 03/08/21. Pt provided with contact info and advised to call to schedule appt.  Lung Cancer Screening: (Low Dose CT Chest recommended if Age 66-3-80ars, 30 pack-year currently smoking OR have quit w/in 15years.) does qualify.   Lung Cancer Screening Referral: [facility] the possible need for this exam. The patient's chart  will be reviewed to determine if they qualify and the patient will be contacted to facilitate the scheduling of the Low Dose Chest CT for lung cancer screening.    Additional Screening:  Hepatitis C Screening: does qualify; Completed 11/22/17  Vision Screening: Recommended annual ophthalmology exams for early detection of glaucoma and other disorders of the eye. Is the patient up to date with their annual eye exam?  No  Who is the provider or what is the name of the office in which the patient attends annual eye exams? Not established If pt is not established with a provider, would they like to be referred to a  provider to establish care? Yes .   Dental Screening: Recommended annual dental exams for proper oral hygiene  Community Resource Referral / Chronic Care Management: CRR required this visit?  Yes - transportation resources  CCM required this visit?  No      Plan:     I have personally reviewed and noted the following in the patients chart:   Medical and social history Use of alcohol, tobacco or illicit drugs  Current medications and supplements including opioid prescriptions.  Functional ability and status Nutritional status Physical activity Advanced directives List of other physicians Hospitalizations, surgeries, and ER visits in previous 12 months Vitals Screenings to include cognitive, depression, and falls Referrals and appointments  In addition, I have reviewed and discussed with patient certain preventive protocols, quality metrics, and best practice recommendations. A written personalized care plan for preventive services as well as general preventive health recommendations were provided to patient.     Clemetine Marker, LPN   0/10/254   Nurse Notes: pt states she is out of tramadol for neck pain. Today 5/10. She had to cancel appt in Jan due to transportation [facility] sent today) and she was scheduled on 2/2 but thought it was rescheduled for today and didn't realize today was AWV. Per Dr. Ancil Boozer pt added at 3:40.

## 2021-07-14 ENCOUNTER — Ambulatory Visit: Payer: Medicare Other | Admitting: Family Medicine

## 2021-07-14 ENCOUNTER — Telehealth: Payer: Self-pay

## 2021-07-14 LAB — DM TEMPLATE

## 2021-07-14 LAB — DRUG MONITOR,OPIATES,SCREEN,URINE: Opiates: NEGATIVE ng/mL (ref <100)

## 2021-07-14 NOTE — Telephone Encounter (Signed)
° °  Telephone encounter was:  Successful.  07/14/2021 Name: ZANARIA MORELL MRN: [id number] DOB: 1950-06-25  KAYLYN GARROW is a 71 y.o. year old female who is a primary care patient of Steele Sizer, MD . The community resource team was consulted for assistance with Transportation Needs   Care guide performed the following interventions: Patient provided with information about care guide support team and interviewed to confirm resource needs.  Follow Up Plan:  Care guide will follow up with patient by phone over the next 3-81 days  Glenola Wheat, AAS Paralegal, [place] [facility]  [address], [place] [address] ??[email or web address]   ?? [phone]   [email or web address]

## 2021-07-19 ENCOUNTER — Telehealth: Payer: Self-pay

## 2021-07-19 NOTE — Telephone Encounter (Signed)
° °  Telephone encounter was:  Unsuccessful.  07/19/2021 Name: Terri Burns MRN: [id number] DOB: 03-Jun-1951  Unsuccessful outbound call made today to assist with:  Transportation Needs   Outreach Attempt:  2nd Attempt. Left message on voicmail for patient to return my call regarding information given for [facility].  A HIPAA compliant voice message was left requesting a return call.  Instructed patient to call back at [phone].  Estle Sabella, AAS Paralegal, [place] [facility]  [address], [place] [address] ??[email or web address]   ?? [phone]   [email or web address]

## 2021-07-20 ENCOUNTER — Ambulatory Visit: Payer: Medicare Other | Admitting: Family Medicine

## 2021-07-26 ENCOUNTER — Telehealth: Payer: Self-pay

## 2021-07-26 NOTE — Telephone Encounter (Signed)
° °  Telephone encounter was:  Unsuccessful.  07/26/2021 Name: Terri Burns MRN: [id number] DOB: March 31, 1951  Unsuccessful outbound call made today to assist with:  Left message on voicmail for patient to return my call regarding information given for [facility] Attempt:  3rd Attempt.  Referral closed unable to contact patient.  A HIPAA compliant voice message was left requesting a return call.  Instructed patient to call back at [phone].  Payeton Germani, AAS Paralegal, [place] [facility]  [address],  [address] ??[email or web address]   ?? [phone]   [email or web address]

## 2021-08-03 DIAGNOSIS — J449 Chronic obstructive pulmonary disease, unspecified: Secondary | ICD-10-CM | POA: Diagnosis not present

## 2021-08-05 ENCOUNTER — Telehealth: Payer: Self-pay

## 2021-08-05 ENCOUNTER — Other Ambulatory Visit: Payer: Self-pay | Admitting: Family Medicine

## 2021-08-05 DIAGNOSIS — G8929 Other chronic pain: Secondary | ICD-10-CM

## 2021-08-05 DIAGNOSIS — F1190 Opioid use, unspecified, uncomplicated: Secondary | ICD-10-CM

## 2021-08-05 NOTE — Telephone Encounter (Signed)
Requested medication (s) are due for refill today: yes ? ?Requested medication (s) are on the active medication list: yes ? ?Last refill:  07/13/21 #60 2 refills ? ?Future visit scheduled: yes in 2 months ? ?Notes to clinic:  not delegated per protocol. Do you want to refill Rx? ? ? ?  ?Requested Prescriptions  ?Pending Prescriptions Disp Refills  ? traMADol (ULTRAM) 50 MG tablet 60 tablet 2  ?  Sig: Take 1 tablet (50 mg total) by mouth 2 (two) times daily.  ?  ? Not Delegated - Analgesics:  Opioid Agonists Failed - 08/05/2021  9:13 AM  ?  ?  Failed - This refill cannot be delegated  ?  ?  Failed - Urine Drug Screen completed in last 360 days  ?  ?  Passed - Valid encounter within last 3 months  ?  Recent Outpatient Visits   ? ?      ? 3 weeks ago Senile purpura ([place])  ? [facility], Drue Stager, MD  ? 4 months ago Chronic neck pain  ? [facility], Drue Stager, MD  ? 4 months ago Centrilobular emphysema [facility])  ? [place], DO  ? 5 months ago Non-ST elevation (NSTEMI) myocardial infarction [facility])  ? [place], DO  ? 11 months ago Atherosclerosis of aorta [facility])  ? [facility] Steele Sizer, MD  ? ?  ?  ?Future Appointments   ? ?        ? In 1 month [place], Cadence H, PA-C [place]  ? In 2 months Steele Sizer, MD [facility], [place]  ? In 66 months  [place]  ? ?  ? ?  ?  ?  ? ?

## 2021-08-05 NOTE — Telephone Encounter (Signed)
Medication Refill - Medication: traMADol (ULTRAM) 50 MG tablet  ? ?Has the patient contacted their pharmacy? yes ?(Agent: If no, request that the patient contact the pharmacy for the refill. If patient does not wish to contact the pharmacy document the reason why and proceed with request.) ?(Agent: If yes, when and what did the pharmacy advise?)pharmacy asst called in  ? ?Preferred Pharmacy (with phone number or street name):  ?[facility] - [place], [place] - [address] Phone:  [phone]  ?Fax:  [phone]  ?  ? ?Has the patient been seen for an appointment in the last year OR does the patient have an upcoming appointment? yes ? ?Agent: Please be advised that RX refills may take up to 3 business days. We ask that you follow-up with your pharmacy.  ?

## 2021-08-05 NOTE — Progress Notes (Signed)
? ? ?Chronic Care Management ?Pharmacy Assistant  ? ?Name: Terri Burns  MRN: [id number] DOB: 1950-08-26 ? ?Reason for Encounter: Medication Review/Medication Coordination ?  ?Recent office visits:  ?07/13/2021 Steele Sizer, MD (PCP Office Visit) for Medication Refill- Started: Fluconazole 150 mg every other day, Stopped: Alprazolam 0.25 mg due to patient not taking, No follow-up noted ? ?07/13/2021 Clemetine Marker, LPN (PCP Clinical Support) for Medicare Wellness Exam- No medication changes noted, Ophthalmology Exam Ordered Lung Cancer Screening Ordered,  ? ?Recent consult visits:  ?None ID ? ?Hospital visits:  ?None in previous 6 months ? ?Medications: ?Outpatient Encounter Medications as of 08/05/2021  ?Medication Sig  ? albuterol (PROVENTIL) (2.5 MG/3ML) 0.083% nebulizer solution Take 3 mLs (2.5 mg total) by nebulization every 4 (four) hours as needed for wheezing or shortness of breath.  ? albuterol (VENTOLIN HFA) 108 (90 Base) MCG/ACT inhaler Inhale 1-2 puffs into the lungs every 6 (six) hours as needed for wheezing or shortness of breath.  ? aspirin 81 MG EC tablet TAKE 1 TABLET BY MOUTH EVERY DAY  ? atorvastatin (LIPITOR) 80 MG tablet Take 1 tablet (80 mg total) by mouth daily.  ? citalopram (CELEXA) 20 MG tablet Take 1.5 tablets (30 mg total) by mouth every evening.  ? fluconazole (DIFLUCAN) 150 MG tablet Take 1 tablet (150 mg total) by mouth every other day.  ? fluticasone (FLONASE) 50 MCG/ACT nasal spray PLACE TWO SPRAYS into BOTH nostrils DAILY  ? Fluticasone-Umeclidin-Vilant (TRELEGY ELLIPTA) 100-62.5-25 MCG/INH AEPB Inhale 1 puff into the lungs daily.  ? isosorbide mononitrate (IMDUR) 30 MG 24 hr tablet Take 0.5 tablets (15 mg total) by mouth daily.  ? loratadine (ALLERGY RELIEF) 10 MG tablet Take 1 tablet (10 mg total) by mouth daily.  ? losartan (COZAAR) 100 MG tablet Take 1 tablet (100 mg total) by mouth every evening.  ? metoprolol succinate (TOPROL-XL) 25 MG 24 hr tablet Take 1 tablet (25 mg total)  by mouth every evening.  ? NARCAN 4 MG/0.1ML LIQD nasal spray kit 1 spray as needed. As needed in case of overdose  ? pantoprazole (PROTONIX) 40 MG tablet Take 1 tablet (40 mg total) by mouth every evening.  ? ticagrelor (BRILINTA) 90 MG TABS tablet Take 1 tablet (90 mg total) by mouth 2 (two) times daily.  ? traMADol (ULTRAM) 50 MG tablet Take 1 tablet (50 mg total) by mouth 2 (two) times daily.  ? ?No facility-administered encounter medications on file as of 08/05/2021.  ? ?Care Gaps: ?Mammogram ?Dexa Scan ? ?Star Rating Drugs: ?Atorvastatin 80 mg last filled on 07/13/2021 for a 30-Day supply with [facility] ?Losartan 100 mg last filled on 07/13/2021 for a 30-Day supply with [facility] ? ?BP Readings from Last 3 Encounters:  ?07/13/21 138/80  ?07/13/21 138/80  ?06/15/21 (!) 150/90  ?  ?No results found for: HGBA1C  ? ?Patient obtains medications through Adherence Packaging  30 Days  ? ?Last adherence delivery included:  ?Brilinta 90 mg 1 tablet twice daily (Breakfast, Bedtime) ?Atorvastatin 80 mg 1 tablet daily (Breakfast) ?Isosorbide Mononitrate 30 mg Take 1/2 tablet daily (Breakfast) ?Trelegy Ellipta 100-62.5-25 mcg inhale 1 puff into lungs daily ?Metoprolol Succinate 25 mg 1 tablet daily (Evening Meal) ?Losartan 100 mg 1 tablet daily (Evening Meal) ?Pantoprazole 40 mg 1 tablet daily (Evening Meal) ?Loratadine 10 mg 1 tablet daily (Breakfast) ?Citalopram 20 mg 1.5 tablets daily (Evening Meal) ? ?Patient declined medications last month: ?Albuterol Inhaler 108 Inhale 1-2 puffs every six hours prn for wheezing or shortness  of breath- PRN ?Xanax 0.25 mg 1 tablet twice daily prn for anxiety- PRN  ?Flonase 50 mcg place 2 sprays into both nostrils daily- patient stated she has a sufficient amount ? ?Patient is due for next adherence delivery on: 08/17/2021 (Tuesday) 2nd Route after 3 pm. ? ?Called patient and reviewed medications and coordinated delivery. ? ?This delivery to include: ? ?Trelegy Ellipta  100-62.5-25 mcg inhale 1 puff into lungs daily ?Isosorbide Mononitrate 30 mg Take 1/2 tablet daily (Breakfast) ?Brilinta 90 mg 1 tablet twice daily (Breakfast, Bedtime) ?Citalopram 20 mg 1.5 tablets daily (Evening Meal) ?Atorvastatin 80 mg 1 tablet daily (Breakfast) ?Metoprolol Succinate 25 mg 1 tablet daily (Evening Meal) ?Losartan 100 mg 1 tablet daily (Evening Meal) ?Pantoprazole 40 mg 1 tablet daily (Evening Meal) ?Loratadine 10 mg 1 tablet daily (Breakfast) ?Fluticasone (Flonase) Nasal Spray 50 mcg 2 sprays in both nostrils daily ? ?Patient declined the following medications: ?Albuterol Inhaler 108 Inhale 1-2 puffs every six hours prn for wheezing or shortness of breath- PRN ? ?Patient needs refills for: ? ?No refills needed at this time. [facility] the patient's [facility], but I also contacted PCP's Office to see if they can send refills to [facility]. ? ?Confirmed delivery date of 08/17/2021 2nd Route, advised patient that pharmacy will contact them the morning of delivery. ? ?I spoke with the patient, and she reports she was not aware that P[facility] so she never picked up the prescription. I informed her that I requested the medication be transferred to [facility] and I will keep an eye on it so if it's transferred today I will do an Acute Form to have this medication delivered to her tomorrow. If it's not sent in by tomorrow I notified the patient she [facility] since she needs [facility] now. Patient verbalized understanding.  ? ?I sent a message to the pharmacy to have them look out for the Tramadol, and to notify me if they do get the refill request.   ? ?Patient has a scheduled telephone appointment with [name], CPP on 01/05/2022 @ 1500 ? ?Lynann Bologna, CPA/CMA ?Clinical Pharmacist Assistant ?Phone: [phone]  ? ? ?

## 2021-08-11 ENCOUNTER — Other Ambulatory Visit: Payer: Self-pay | Admitting: Family Medicine

## 2021-08-11 DIAGNOSIS — J432 Centrilobular emphysema: Secondary | ICD-10-CM

## 2021-09-01 ENCOUNTER — Other Ambulatory Visit: Payer: Self-pay | Admitting: Family Medicine

## 2021-09-01 DIAGNOSIS — I10 Essential (primary) hypertension: Secondary | ICD-10-CM

## 2021-09-03 ENCOUNTER — Other Ambulatory Visit: Payer: Self-pay | Admitting: Cardiovascular Disease

## 2021-09-06 ENCOUNTER — Telehealth: Payer: Self-pay

## 2021-09-06 DIAGNOSIS — J3089 Other allergic rhinitis: Secondary | ICD-10-CM

## 2021-09-06 NOTE — Progress Notes (Signed)
? ? ?Chronic Care Management ?Pharmacy Assistant  ? ?Name: Terri Burns  MRN: [id number] DOB: [birth year] ? ?Reason for Encounter: Medication Review/Medication Coordination for [facility] ?  ?Recent office visits:  ?None ID ? ?Recent consult visits:  ?None ID ? ?Hospital visits:  ?None in previous 6 months ? ?Medications: ?Outpatient Encounter Medications as of 09/06/2021  ?Medication Sig  ? albuterol (PROVENTIL) (2.5 MG/3ML) 0.083% nebulizer solution Take 3 mLs (2.5 mg total) by nebulization every 4 (four) hours as needed for wheezing or shortness of breath.  ? albuterol (VENTOLIN HFA) 108 (90 Base) MCG/ACT inhaler Inhale 1-2 puffs into the lungs every 6 (six) hours as needed for wheezing or shortness of breath.  ? aspirin 81 MG EC tablet TAKE 1 TABLET BY MOUTH EVERY DAY  ? atorvastatin (LIPITOR) 80 MG tablet Take 1 tablet (80 mg total) by mouth daily.  ? BRILINTA 90 MG TABS tablet TAKE ONE TABLET BY MOUTH EVERY MORNING and TAKE ONE TABLET BY MOUTH EVERYDAY AT BEDTIME  ? citalopram (CELEXA) 20 MG tablet Take 1.5 tablets (30 mg total) by mouth every evening.  ? fluconazole (DIFLUCAN) 150 MG tablet Take 1 tablet (150 mg total) by mouth every other day.  ? fluticasone (FLONASE) 50 MCG/ACT nasal spray PLACE TWO SPRAYS into BOTH nostrils DAILY  ? Fluticasone-Umeclidin-Vilant (TRELEGY ELLIPTA) 100-62.5-25 MCG/INH AEPB Inhale 1 puff into the lungs daily.  ? isosorbide mononitrate (IMDUR) 30 MG 24 hr tablet Take 0.5 tablets (15 mg total) by mouth daily.  ? loratadine (ALLERGY RELIEF) 10 MG tablet Take 1 tablet (10 mg total) by mouth daily.  ? losartan (COZAAR) 100 MG tablet TAKE ONE TABLET BY MOUTH EVERY EVENING  ? metoprolol succinate (TOPROL-XL) 25 MG 24 hr tablet Take 1 tablet (25 mg total) by mouth every evening.  ? NARCAN 4 MG/0.1ML LIQD nasal spray kit 1 spray as needed. As needed in case of overdose  ? pantoprazole (PROTONIX) 40 MG tablet Take 1 tablet (40 mg total) by mouth every evening.  ? traMADol (ULTRAM)  50 MG tablet Take 1 tablet (50 mg total) by mouth 2 (two) times daily.  ? ?No facility-administered encounter medications on file as of 09/06/2021.  ? ?Care Gaps: ?Mammogram ?Dexa Scan ? ?Star Rating Drugs: ?Atorvastatin 80 mg last filled on 08/12/2021 for a 30-Day supply with [facility] ?Losartan 100 mg last filled on 08/12/2021 for a 30-Day supply with [facility] ? ?BP Readings from Last 3 Encounters:  ?07/13/21 138/80  ?07/13/21 138/80  ?06/15/21 (!) 150/90  ?  ?No results found for: HGBA1C  ? ?Patient obtains medications through Adherence Packaging  30 Days  ? ?Last adherence delivery included:  ?Trelegy Ellipta 100-62.5-25 mcg inhale 1 puff into lungs daily ?Isosorbide Mononitrate 30 mg Take 1/2 tablet daily (Breakfast) ?Brilinta 90 mg 1 tablet twice daily (Breakfast, Bedtime) ?Citalopram 20 mg 1.5 tablets daily (Evening Meal) ?Atorvastatin 80 mg 1 tablet daily (Breakfast) ?Metoprolol Succinate 25 mg 1 tablet daily (Evening Meal) ?Losartan 100 mg 1 tablet daily (Evening Meal) ?Pantoprazole 40 mg 1 tablet daily (Evening Meal) ?Loratadine 10 mg 1 tablet daily (Breakfast) ?Fluticasone (Flonase) Nasal Spray 50 mcg 2 sprays in both nostrils daily ? ?Patient declined medications last month: ?Albuterol Inhaler 108 Inhale 1-2 puffs every six hours prn for wheezing or shortness of breath- PRN ? ?Patient is due for next adherence delivery on: 09/16/2021 (Thursday) 2nd Route after 3 pm . ? ?Called patient and reviewed medications and coordinated delivery. ? ?This delivery to include: ?Trelegy Ellipta  100-62.5-25 mcg inhale 1 puff into lungs daily ?Isosorbide Mononitrate 30 mg Take 1/2 tablet daily (Breakfast) ?Brilinta 90 mg 1 tablet twice daily (Breakfast, Bedtime) ?Citalopram 20 mg 1.5 tablets daily (Evening Meal) ?Atorvastatin 80 mg 1 tablet daily (Breakfast) ?Metoprolol Succinate 25 mg 1 tablet daily (Evening Meal) ?Losartan 100 mg 1 tablet daily (Evening Meal) ?Pantoprazole 40 mg 1 tablet daily (Evening  Meal) ?Loratadine 10 mg 1 tablet daily (Breakfast) ?Fluticasone (Flonase) Nasal Spray 50 mcg 2 sprays in both nostrils daily ?Albuterol Inhaler 108 Inhale 1-2 puffs every six hours prn for wheezing or shortness of breath- PRN ? ?Patient declined the following medications: ?Tramadol 50 mg tablet [facility] ? ?Patient needs refills for Brilinta but request was sent to [facility] by [facility]. ? ?Confirmed delivery date of 09/16/2021 2nd Route, advised patient that pharmacy will contact them the morning of delivery. ? ?I spoke with the patient and she reports that she isn't doing well today. Per patient she is having sever pain and she is having a hard time [facility]. Patient stated that the 30-Day supply is being rejected. ? ?I spoke with the pharmacist while patient was in store and per pharmacist [name] will cover for 7 days at a time but she is needing a PA for 30-Day supply. They were able to give her a 30-Day supply via cash payment until the PA is done. ? ?I informed the patient the I would contact [employer] to start her PA process to see if we can get her Tramadol approved for more than 7-Days at a time.  ? ?I contacted [name] with [name] and the representative was able to see the rejection. The representative did confirm the medication would need a PA in order for the patient to get a 20-Day supply. The representative assisted me with the PA process via telephone and advised they will be faxing the decision over to [name]. CPP has been notified and requested to be on the look our for the fax. ? ?Patient also has a hard time swallowing the Atorvastatin 80 mg so she would like to try 40 mg twice daily instead since the pills are smaller.  ? ?Patient has a telephone appointment with [name], CPP on 01/05/2022 @ 1500 ? ?Lynann Bologna, CPA/CMA ?Clinical Pharmacist Assistant ?Phone: [phone]  ? ?

## 2021-09-07 MED ORDER — ATORVASTATIN CALCIUM 40 MG PO TABS: 80.0000 mg | ORAL_TABLET | Freq: Every day | ORAL | 1 refills | Status: DC

## 2021-09-07 MED ORDER — FLUTICASONE PROPIONATE 50 MCG/ACT NA SUSP: NASAL | 0 refills | Status: DC

## 2021-09-07 MED ORDER — LORATADINE 10 MG PO TABS: 10.0000 mg | ORAL_TABLET | Freq: Every day | ORAL | 1 refills | Status: DC

## 2021-09-07 NOTE — Addendum Note (Signed)
Addended by: Daron Offer A on: 09/07/2021 08:35 AM ? ? Modules accepted: Orders ? ?

## 2021-09-17 ENCOUNTER — Ambulatory Visit: Payer: Medicare Other | Admitting: Medical

## 2021-09-17 NOTE — Progress Notes (Deleted)
?Cardiology Office Note:   ? ?Date:  09/17/2021  ? ?ID:  Terri Burns, DOB 19-Oct-1950, MRN [id number] ? ?PCP:  Steele Sizer, MD  ?[facility] Cardiologist:  None  ?[place] Electrophysiologist:  None  ? ?Referring MD: Steele Sizer, MD  ? ?Chief Complaint: 3 month follow-up ? ?History of Present Illness:   ? ?Terri Burns is a 71 y.o. female with a hx of with a hx of severe one-vessel CAD and LHC with PCI to LAD, 02/2021 non-STEMI, HFpEF, COPD, current tobacco use, hypertension, hyperlipidemia who presents for 1 month f/u.  ?  ?On 02/19/2021, she presented to the [facility] ED with shortness of breath.  She was admitted and subsequently had chest tightness 9/18 around 5 AM.  This chest tightness/pressure continued over the next 2 hours despite administration of painkillers and breathing treatments.  At 1 point, the chest tightness radiating to under her left arm.  She also recalled pain between her shoulder blades the previous night.  EKG showed Q waves in the inferior 2, 3, aVF and precordial lateral leads, as well as T wave inversion in upsloping ST changes.  High-sensitivity troponin was cycled and 308, though this lab was not cycled further to determine a peak in her troponin.  Recommendation was for further ischemic workup given RF for CAD and via LHC. ?  ?[place] 02/23/2021 showed severe one-vessel CAD with 99% thrombotic stenosis of the proximal LAD, which was not the culprit of her non-ST elevation myocardial infarction.  Mild to moderate left circumflex and RCA disease was also noted.  In addition, moderately to severely reduced LV SF with EF 25 to 30% and severe anterior/apical hypokinesis is noted.  She had severely elevated LVEDP at 40 mmHg.  Successful balloon angioplasty with aspiration thrombectomy and drug-eluting stent placement to the proximal LAD was performed.  Initially, the plan was to MACE intravascular lithotripsy given the calcifications; however, she developed chest pain with sluggish  flow to the LAD just before PCI was started.  Recommendations were to continue DAPT for at least 12 months with aggressive treatment of risk factors.  She was also given IV furosemide for her elevated volume status.  The remainder of her CAD was to be treated medically.   ?  ?Echo, performed 6, showed EF 55 to 60%, G1 DD, mild to moderate aortic valve sclerosis without stenosis.   ?  ?At discharge, it was noted that further titration of GDMT was limited by hypotension and bradycardia.  Repeat limited echo at follow-up was recommended due to inconsistency in patient EF, specifically catheterization showed EF 25 to 30% and echo EF 55 to 60%.  Amlodipine was held due to low BP. PT/OT consulted with pt report she was living alone and uses a walker at home. ? ?Last seen 06/15/21 and was doing well, no changes were made ? ?Past Medical History:  ?Diagnosis Date  ? COPD (chronic obstructive pulmonary disease) ([place])   ? Depression   ? GERD (gastroesophageal reflux disease)   ? History of SCC (squamous cell carcinoma) of skin 05/12/2020  ? right temple  well differentiated   ? Hyperlipidemia   ? Hypertension   ? Squamous cell carcinoma of skin 06/12/2019  ? right temple  ? ? ?Past Surgical History:  ?Procedure Laterality Date  ? CORONARY STENT INTERVENTION N/A 02/22/2021  ? Procedure: CORONARY STENT INTERVENTION;  Surgeon: Wellington Hampshire, MD;  Location: [facility];  Service: Cardiovascular;  Laterality: N/A;  ? LEFT HEART CATH  AND CORONARY ANGIOGRAPHY N/A 02/22/2021  ? Procedure: LEFT HEART CATH AND CORONARY ANGIOGRAPHY;  Surgeon: Wellington Hampshire, MD;  Location: [place] [facility];  Service: Cardiovascular;  Laterality: N/A;  ? NECK SURGERY N/A 10/2003  ? SHOULDER SURGERY Left 2004  ? ? ?Current Medications: ?No outpatient medications have been marked as taking for the 09/17/21 encounter (Appointment) with Kathlen Mody, Cyann Venti H, PA-C.  ?  ? ?Allergies:   Levofloxacin, Penicillins, Meloxicam, and Nsaids  ? ?Social  History  ? ?Socioeconomic History  ? Marital status: Widowed  ?  Spouse name: Not on file  ? Number of children: 3  ? Years of education: Not on file  ? Highest education level: Some college, no degree  ?Occupational History  ? Occupation: disabled   ?  Comment: 2007 - [employer]DD cervical spine  ?Tobacco Use  ? Smoking status: Every Day  ?  Packs/day: 0.25  ?  Years: 50.00  ?  Pack years: 12.50  ?  Types: Cigarettes  ?  Start date: 12/10/1973  ? Smokeless tobacco: Never  ? Tobacco comments:  ?  1-2 ciggs per day - 04/13/2021; has a hx of 2 PPD x 10 years   ?Vaping Use  ? Vaping Use: Never used  ?Substance and Sexual Activity  ? Alcohol use: Yes  ?  Alcohol/week: 0.0 standard drinks  ?  Comment: occasionally  ? Drug use: No  ? Sexual activity: Not Currently  ?  Partners: Male  ?Other Topics Concern  ? Not on file  ?Social History Narrative  ? Lives in a senior citizen complex and goes out with her friends.  ? Her daughter died due to heart issues on Oct 16, 2018 at the age of 73.  ? Both sons live in town and are helping her out   ? ?Social Determinants of Health  ? ?Financial Resource Strain: Low Risk   ? Difficulty of Paying Living Expenses: Not hard at all  ?Food Insecurity: No Food Insecurity  ? Worried About Charity fundraiser in the Last Year: Never true  ? Ran Out of Food in the Last Year: Never true  ?Transportation Needs: Unmet Transportation Needs  ? Lack of Transportation (Medical): Yes  ? Lack of Transportation (Non-Medical): No  ?Physical Activity: Insufficiently Active  ? Days of Exercise per Week: 4 days  ? Minutes of Exercise per Session: 20 min  ?Stress: No Stress Concern Present  ? Feeling of Stress : Not at all  ?Social Connections: Moderately Integrated  ? Frequency of Communication with Friends and Family: More than three times a week  ? Frequency of Social Gatherings with Friends and Family: More than three times a week  ? Attends Religious Services: More than 4 times per year  ? Active Member of Clubs or  Organizations: Yes  ? Attends Archivist Meetings: More than 4 times per year  ? Marital Status: Widowed  ?  ? ?Family History: ?The patient's family history includes Diabetes in her father; Heart disease in her daughter and father; Hypertension in her mother and son; Pulmonary embolism in her daughter; Stroke in her mother. ? ?ROS:   ?Please see the history of present illness.    ? All other systems reviewed and are negative. ? ?EKGs/Labs/Other Studies Reviewed:   ? ?The following studies were reviewed today: ? ?Cath ?Left Heart ?  ?Left Ventricle The left ventricle is mildly dilated. There is moderate to severe left ventricular systolic dysfunction. LV end diastolic pressure is  severely elevated. The left ventricular ejection fraction is 25-35% by visual estimate. There are LV function abnormalities due to segmental dysfunction.  ?  ?Coronary Diagrams ?  ?Diagnostic ?Dominance: Right ?Intervention ?  ? ?02/22/21 ?  Prox RCA to Mid RCA lesion is 60% stenosed. ?  Ost LAD to Prox LAD lesion is 25% stenosed. ?  Ost Cx to Prox Cx lesion is 40% stenosed. ?  1st Mrg lesion is 30% stenosed. ?  Prox LAD lesion is 99% stenosed. ?  A drug-eluting stent was successfully placed using a STENT ONYX FRONTIER 3.0X18. ?  Post intervention, there is a 0% residual stenosis. ?  There is moderate to severe left ventricular systolic dysfunction. ?  LV end diastolic pressure is severely elevated. ?  The left ventricular ejection fraction is 25-35% by visual estimate. ?  ?1.  Severe one-vessel coronary artery disease with 99% thrombotic stenosis in the proximal LAD which seems to be the culprit for non-ST elevation myocardial infarction.  In addition, there is mild to moderate left circumflex and RCA disease. ?2.  Moderately to severely reduced LV systolic function with an EF of 25 to 30% with severe anterior/apical hypokinesis. ?3.  Severely elevated left ventricular end-diastolic pressure at 40 mmHg. ?4.  Successful balloon  angioplasty, aspiration thrombectomy and drug-eluting stent placement to the proximal LAD.  The initial plan was to use intravascular lithotripsy given calcifications but the patient developed chest pain with

## 2021-09-20 ENCOUNTER — Encounter: Payer: Self-pay | Admitting: Medical

## 2021-10-04 ENCOUNTER — Other Ambulatory Visit: Payer: Self-pay | Admitting: Cardiovascular Disease

## 2021-10-04 ENCOUNTER — Other Ambulatory Visit: Payer: Self-pay | Admitting: Family Medicine

## 2021-10-04 NOTE — Telephone Encounter (Signed)
Please reschedule F/U appointment-Patient did not show for last scheduled office visit on 09/17/2021. Thank you! ?

## 2021-10-05 ENCOUNTER — Telehealth: Payer: Self-pay

## 2021-10-05 ENCOUNTER — Ambulatory Visit (INDEPENDENT_AMBULATORY_CARE_PROVIDER_SITE_OTHER): Payer: Medicare Other | Admitting: Family Medicine

## 2021-10-05 ENCOUNTER — Encounter: Payer: Self-pay | Admitting: Family Medicine

## 2021-10-05 VITALS — BP 118/78 | HR 84 | Temp 98.4°F | Resp 16 | Ht 60.0 in | Wt 130.0 lb

## 2021-10-05 DIAGNOSIS — K219 Gastro-esophageal reflux disease without esophagitis: Secondary | ICD-10-CM

## 2021-10-05 DIAGNOSIS — J3089 Other allergic rhinitis: Secondary | ICD-10-CM

## 2021-10-05 DIAGNOSIS — J432 Centrilobular emphysema: Secondary | ICD-10-CM

## 2021-10-05 DIAGNOSIS — R0601 Orthopnea: Secondary | ICD-10-CM

## 2021-10-05 DIAGNOSIS — I700 Atherosclerosis of aorta: Secondary | ICD-10-CM | POA: Diagnosis not present

## 2021-10-05 DIAGNOSIS — I10 Essential (primary) hypertension: Secondary | ICD-10-CM

## 2021-10-05 DIAGNOSIS — F3342 Major depressive disorder, recurrent, in full remission: Secondary | ICD-10-CM

## 2021-10-05 DIAGNOSIS — D692 Other nonthrombocytopenic purpura: Secondary | ICD-10-CM

## 2021-10-05 DIAGNOSIS — F1190 Opioid use, unspecified, uncomplicated: Secondary | ICD-10-CM

## 2021-10-05 DIAGNOSIS — N763 Subacute and chronic vulvitis: Secondary | ICD-10-CM

## 2021-10-05 DIAGNOSIS — R32 Unspecified urinary incontinence: Secondary | ICD-10-CM

## 2021-10-05 DIAGNOSIS — M542 Cervicalgia: Secondary | ICD-10-CM

## 2021-10-05 DIAGNOSIS — I252 Old myocardial infarction: Secondary | ICD-10-CM

## 2021-10-05 DIAGNOSIS — G8929 Other chronic pain: Secondary | ICD-10-CM

## 2021-10-05 DIAGNOSIS — R0609 Other forms of dyspnea: Secondary | ICD-10-CM

## 2021-10-05 MED ORDER — CITALOPRAM HYDROBROMIDE 20 MG PO TABS: 30.0000 mg | ORAL_TABLET | Freq: Every evening | ORAL | 0 refills | Status: DC

## 2021-10-05 MED ORDER — PANTOPRAZOLE SODIUM 40 MG PO TBEC: 40.0000 mg | DELAYED_RELEASE_TABLET | Freq: Every evening | ORAL | 1 refills | Status: DC

## 2021-10-05 MED ORDER — TRAMADOL HCL 50 MG PO TABS: 50.0000 mg | ORAL_TABLET | Freq: Three times a day (TID) | ORAL | 2 refills | Status: DC

## 2021-10-05 MED ORDER — LOSARTAN POTASSIUM 100 MG PO TABS: 100.0000 mg | ORAL_TABLET | Freq: Every evening | ORAL | 1 refills | Status: DC

## 2021-10-05 MED ORDER — TRELEGY ELLIPTA 100-62.5-25 MCG/ACT IN AEPB: 1.0000 | INHALATION_SPRAY | Freq: Every day | RESPIRATORY_TRACT | 11 refills | Status: DC

## 2021-10-05 MED ORDER — METOPROLOL SUCCINATE ER 25 MG PO TB24: 25.0000 mg | ORAL_TABLET | Freq: Every evening | ORAL | 1 refills | Status: DC

## 2021-10-05 NOTE — Progress Notes (Signed)
? ? ?Chronic Care Management ?Pharmacy Assistant  ? ?Name: Terri Burns  MRN: [id number] DOB: 1950-10-26 ? ?Reason for Encounter: Medication Review/Medication Coordination for [facility] ?  ?Recent office visits:  ?None ID ? ?Recent consult visits:  ?None ID ? ?Hospital visits:  ?None in previous 6 months ? ?Medications: ?Outpatient Encounter Medications as of 10/05/2021  ?Medication Sig  ? albuterol (PROVENTIL) (2.5 MG/3ML) 0.083% nebulizer solution Take 3 mLs (2.5 mg total) by nebulization every 4 (four) hours as needed for wheezing or shortness of breath.  ? albuterol (VENTOLIN HFA) 108 (90 Base) MCG/ACT inhaler Inhale 1-2 puffs into the lungs every 6 (six) hours as needed for wheezing or shortness of breath.  ? aspirin 81 MG EC tablet TAKE 1 TABLET BY MOUTH EVERY DAY  ? atorvastatin (LIPITOR) 40 MG tablet Take 2 tablets (80 mg total) by mouth daily.  ? BRILINTA 90 MG TABS tablet TAKE ONE TABLET BY MOUTH EVERY MORNING and TAKE ONE TABLET BY MOUTH EVERYDAY AT BEDTIME  ? citalopram (CELEXA) 20 MG tablet Take 1.5 tablets (30 mg total) by mouth every evening.  ? fluconazole (DIFLUCAN) 150 MG tablet Take 1 tablet (150 mg total) by mouth every other day.  ? fluticasone (FLONASE) 50 MCG/ACT nasal spray PLACE TWO SPRAYS into BOTH nostrils DAILY  ? Fluticasone-Umeclidin-Vilant (TRELEGY ELLIPTA) 100-62.5-25 MCG/INH AEPB Inhale 1 puff into the lungs daily.  ? isosorbide mononitrate (IMDUR) 30 MG 24 hr tablet Take 0.5 tablets (15 mg total) by mouth daily.  ? loratadine (ALLERGY RELIEF) 10 MG tablet Take 1 tablet (10 mg total) by mouth daily.  ? losartan (COZAAR) 100 MG tablet TAKE ONE TABLET BY MOUTH EVERY EVENING  ? metoprolol succinate (TOPROL-XL) 25 MG 24 hr tablet Take 1 tablet (25 mg total) by mouth every evening.  ? NARCAN 4 MG/0.1ML LIQD nasal spray kit 1 spray as needed. As needed in case of overdose  ? pantoprazole (PROTONIX) 40 MG tablet Take 1 tablet (40 mg total) by mouth every evening.  ? traMADol (ULTRAM)  50 MG tablet Take 1 tablet (50 mg total) by mouth 2 (two) times daily.  ? ?No facility-administered encounter medications on file as of 10/05/2021.  ? ?Care Gaps: ?Mammogram  ?Dexa Scan ? ?Star Rating Drugs: ?Atorvastatin 80 mg last filled on 09/10/2021 for a 30-Day supply with [facility] ?Losartan 100 mg last filled on 09/10/2021 for a 30-Day supply with [facility] ? ?BP Readings from Last 3 Encounters:  ?07/13/21 138/80  ?07/13/21 138/80  ?06/15/21 (!) 150/90  ?  ?No results found for: HGBA1C  ? ?Patient obtains medications through Adherence Packaging  30 Days  ? ?Last adherence delivery included:  ?Trelegy Ellipta 100-62.5-25 mcg inhale 1 puff into lungs daily ?Isosorbide Mononitrate 30 mg Take 1/2 tablet daily (Breakfast) ?Brilinta 90 mg 1 tablet twice daily (Breakfast, Bedtime) ?Citalopram 20 mg 1.5 tablets daily (Evening Meal) ?Atorvastatin 40 mg 2 tablet daily (Breakfast) ?Metoprolol Succinate 25 mg 1 tablet daily (Evening Meal) ?Losartan 100 mg 1 tablet daily (Evening Meal) ?Pantoprazole 40 mg 1 tablet daily (Evening Meal) ?Loratadine 10 mg 1 tablet daily (Breakfast) ?Fluticasone (Flonase) Nasal Spray 50 mcg 2 sprays in both nostrils daily ?Albuterol Inhaler 108 Inhale 1-2 puffs every six hours prn for wheezing or shortness of breath- PRN ? ?Patient declined medications last month: ?Tramadol 50 mg tablet [facility] ? ?Patient is due for next adherence delivery on: 10/15/2021 (Friday) 2nd Route. ? ?Called patient and reviewed medications and coordinated delivery. ? ?  This delivery to include: ?Trelegy Ellipta 100-62.5-25 mcg inhale 1 puff into lungs daily ?Isosorbide Mononitrate 30 mg Take 1/2 tablet daily (Breakfast) ?Brilinta 90 mg 1 tablet twice daily (Breakfast, Bedtime) ?Citalopram 20 mg 1.5 tablets daily (Evening Meal) ?Atorvastatin 40 mg 2 tablet daily (Breakfast) ?Metoprolol Succinate 25 mg 1 tablet daily (Evening Meal) ?Losartan 100 mg 1 tablet daily (Evening  Meal) ?Pantoprazole 40 mg 1 tablet daily (Evening Meal) ?Loratadine 10 mg 1 tablet daily (Breakfast) ?Fluticasone (Flonase) Nasal Spray 50 mcg 2 sprays in both nostrils daily ?Albuterol Inhaler 108 Inhale 1-2 puffs every six hours prn for wheezing or shortness of breath- PRN ? ?Patient declined the following medications: ?Tramadol 50 mg tablet [facility]-Patient has appointment with PCP today and she will ask her to send future refills to [facility]. Patient will [facility] so the next refill we should have and we can deliver to her. ? ?Patient needs refills for Isosorbide Mono ER, and Brilinta refill request sent to specialist by [facility]. Patient also needs refill on Albuterol which [facility] can send refill, but patient has appointment with PCP today so refill can also be sent by PCP. ? ?Confirmed delivery date of 10/15/2021 2nd Route, advised patient that pharmacy will contact them the morning of delivery. ? ?Patient has a telephone appointment with [name], CPP on 01/05/2022 @ 1500. ? ?Lynann Bologna, CPA/CMA ?Clinical Pharmacist Assistant ?Phone: [phone]  ? ? ?
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
## 2021-10-05 NOTE — Telephone Encounter (Signed)
Requested Prescriptions  ?Pending Prescriptions Disp Refills  ?? VENTOLIN HFA 108 (90 Base) MCG/ACT inhaler [Pharmacy Med Name: Ventolin HFA 90 mcg/actuation aerosol inhaler] 18 g 2  ?  Sig: INHALE 1-2 PUFFS BY MOUTH into THE lungs EVERY 6 HOURS AS NEEDED FOR FOR WHEEZING AND/OR SHORTNESS OF BREATH  ?  ? Pulmonology:  Beta Agonists 2 Passed - 10/04/2021 11:22 AM  ?  ?  Passed - Last BP in normal range  ?  BP Readings from Last 1 Encounters:  ?07/13/21 138/80  ?   ?  ?  Passed - Last Heart Rate in normal range  ?  Pulse Readings from Last 1 Encounters:  ?07/13/21 78  ?   ?  ?  Passed - Valid encounter within last 12 months  ?  Recent Outpatient Visits   ?      ? 2 months ago Senile purpura ([place])  ? [facility] [place], Drue Stager, MD  ? 6 months ago Chronic neck pain  ? [facility] [place], Drue Stager, MD  ? 6 months ago Centrilobular emphysema [facility])  ? [place], DO  ? 7 months ago Non-ST elevation (NSTEMI) myocardial infarction [facility]  ? [place], DO  ? 1 year ago Atherosclerosis of aorta [facility])  ? [facility] Steele Sizer, MD  ?  ?  ?Future Appointments   ?        ? Today Steele Sizer, MD [facility] [place]  ? In 9 months  [place]  ?  ? ?  ?  ?  ? ? ?

## 2021-10-06 ENCOUNTER — Other Ambulatory Visit: Payer: Self-pay | Admitting: Family Medicine

## 2021-10-06 DIAGNOSIS — R0609 Other forms of dyspnea: Secondary | ICD-10-CM

## 2021-10-06 DIAGNOSIS — J432 Centrilobular emphysema: Secondary | ICD-10-CM

## 2021-10-06 LAB — BASIC METABOLIC PANEL
BUN: 14 mg/dL (ref 7–25)
CO2: 24 mmol/L (ref 20–32)
Calcium: 8.9 mg/dL (ref 8.6–10.4)
Chloride: 104 mmol/L (ref 98–110)
Creat: 0.77 mg/dL (ref 0.50–1.05)
Glucose, Bld: 76 mg/dL (ref 65–99)
Potassium: 4.4 mmol/L (ref 3.5–5.3)
Sodium: 139 mmol/L (ref 135–146)

## 2021-10-06 LAB — CBC WITH DIFFERENTIAL/PLATELET
Absolute Monocytes: 704 cells/uL (ref 200–950)
Basophils Absolute: 79 cells/uL (ref 0–200)
Basophils Relative: 0.9 %
Eosinophils Absolute: 229 cells/uL (ref 15–500)
Eosinophils Relative: 2.6 %
HCT: 36.9 % (ref 35.0–45.0)
Hemoglobin: 12 g/dL (ref 11.7–15.5)
Lymphs Abs: 1408 cells/uL (ref 850–3900)
MCH: 27.1 pg (ref 27.0–33.0)
MCHC: 32.5 g/dL (ref 32.0–36.0)
MCV: 83.5 fL (ref 80.0–100.0)
MPV: 10.6 fL (ref 7.5–12.5)
Monocytes Relative: 8 %
Neutro Abs: 6380 cells/uL (ref 1500–7800)
Neutrophils Relative %: 72.5 %
Platelets: 335 10*3/uL (ref 140–400)
RBC: 4.42 10*6/uL (ref 3.80–5.10)
RDW: 14.6 % (ref 11.0–15.0)
Total Lymphocyte: 16 %
WBC: 8.8 10*3/uL (ref 3.8–10.8)

## 2021-10-06 LAB — TROPONIN I: Troponin I: 3 ng/L (ref <=47)

## 2021-10-06 LAB — CK TOTAL AND CKMB (NOT AT ARMC)
CK, MB: 5.7 ng/mL — ABNORMAL HIGH (ref 0–5.0)
Relative Index: 3.5 (ref 0–4.0)
Total CK: 162 U/L — ABNORMAL HIGH (ref 29–143)

## 2021-10-06 LAB — BRAIN NATRIURETIC PEPTIDE: Brain Natriuretic Peptide: 83 pg/mL (ref <100)

## 2021-10-06 MED ORDER — PREDNISONE 10 MG PO TABS: 10.0000 mg | ORAL_TABLET | Freq: Two times a day (BID) | ORAL | 0 refills | Status: DC

## 2021-10-08 ENCOUNTER — Encounter: Payer: Self-pay | Admitting: *Deleted

## 2021-10-09 LAB — TROPONIN I: Troponin I: 3 ng/L (ref <=47)

## 2021-10-09 LAB — CK TOTAL AND CKMB (NOT AT ARMC)
CK, MB: 8.8 ng/mL — ABNORMAL HIGH (ref 0–5.0)
Relative Index: 4.1 — ABNORMAL HIGH (ref 0–4.0)
Total CK: 214 U/L — ABNORMAL HIGH (ref 29–143)

## 2021-10-11 ENCOUNTER — Telehealth: Payer: Self-pay

## 2021-10-11 NOTE — Progress Notes (Signed)
? ?Cardiology Office Note   ? ?Date:  10/12/2021  ? ?ID:  Terri Burns, DOB [birth year], MRN [id number] ? ?PCP:  Steele Sizer, MD  ?Cardiologist:  Kathlyn [place], MD  ?Electrophysiologist:  None  ? ?Chief Complaint: Exertional dyspnea ? ?History of Present Illness:  ? ?Terri Burns is a 71 y.o. female with history of CAD with NSTEMI in 02/2021 status post PCI/DES to the proximal LAD, HFpEF, HTN, HLD, and COPD with ongoing tobacco use who presents for evaluation of exertional dyspnea and abnormal inflammatory markers. ? ?She was admitted to the hospital in 02/2021 with an NSTEMI.  Echo showed an EF of 55 to 48%, grade 1 diastolic dysfunction, normal RV systolic function and ventricular cavity size, mild to moderate aortic valve sclerosis without evidence of stenosis.  LHC on 02/22/2021 showed severe one-vessel CAD with 99% thrombotic stenosis of the proximal LAD which was the culprit lesion for her NSTEMI.  In addition, there was mild to moderate LCx and RCA disease as outlined below.  Moderately to severely reduced LV systolic function with an EF of 25 to 30% with severe anterior/apical hypokinesis on LV gram was noted.  Severely elevated LVEDP estimated at 40 mmHg.  She underwent successful balloon angioplasty, aspiration thrombectomy, and drug-eluting stent placement to the proximal LAD.  The initial plan was to use intravascular lithotripsy given calcifications, however the patient developed chest pain with sluggish flow in the LAD just before PCI was started.  She was subsequently diuresed and medications were optimized.  Repeat echo in 04/2021, given inconsistency noted with prior echo and LV gram, showed an EF of 55 to 60%, no regional wall motion abnormalities, grade 1 diastolic dysfunction, normal RV systolic function and ventricular cavity size, mild to moderate mitral regurgitation, trivial aortic insufficiency, and an estimated right atrial pressure of 3 mmHg.  She was seen in 04/2021 with noted  exertional dyspnea with BNP of 156 at that time.  She was last seen in the office in 06/2021 and was without symptoms of angina or decompensation. ? ?More recent, she was evaluated by her PCP on 10/05/2021 noting dyspnea following vulvar pruritus that has persisted and noted to be with exertion.  During the initial episode, she also felt flushed and sweaty.  Note indicates EMS was called with EKG noted to be "normal" (unavailable for review) and had a glucose of 106, up to 160 following orange juice.  She also reported orthopnea and had been sleeping in a recliner.  Labs obtained through PCPs office at that time included a normal BNP of 83, normal high-sensitivity troponin of 3, elevated total CK 162 with a CK-MB of 5.7.  She was treated with prednisone.  Repeat labs obtained on 10/08/2021 again showed a normal high-sensitivity troponin of 3 with an uptrending CK of 214 and a CK-MB of 8.8.  Given these findings, PCP recommended the patient proceed to the ED.  How are, patient reported she was feeling better and appointment was scheduled for our office today. ? ?She comes in accompanied by her son today.  She reports on 4/26, she developed sudden onset of dyspnea without chest pain.  She also reports her blood sugar was low.  She indicates this subsequently led to a panic attack.  She contacted EMS and reports EKG was unrevealing at that time.  She states her blood pressure was elevated and they remained with her until her blood pressure improved.  She was not transported to the ED.  She subsequently followed up  with her PCP with blood work noted as outlined above.  Since this episode on 4/26, she has been back to baseline with chronic stable dyspnea.  Symptoms did not feel like her NSTEMI in 02/2021.  She has forgotten to take 1, possibly 2 Brilintas.  No falls, hematochezia, or melena.  She does continue to smoke 2 to 5 cigarettes/day.  Currently without complaints. ? ? ?Labs independently reviewed: ?10/2021 - BUN 14,  serum creatinine 0.77, potassium 4.4, Hgb 12.0, PLT 335, WBC 8.8 ?02/2021 - direct LDL 45, albumin 3.7, AST 59, ALT 52, magnesium 2.3 ?02/2020 - TC 173, TG 39, HDL 101, LDL 61 ? ?Past Medical History:  ?Diagnosis Date  ? COPD (chronic obstructive pulmonary disease) ([place])   ? Depression   ? GERD (gastroesophageal reflux disease)   ? History of SCC (squamous cell carcinoma) of skin 05/12/2020  ? right temple  well differentiated   ? Hyperlipidemia   ? Hypertension   ? Squamous cell carcinoma of skin 06/12/2019  ? right temple  ? ? ?Past Surgical History:  ?Procedure Laterality Date  ? CORONARY STENT INTERVENTION N/A 02/22/2021  ? Procedure: CORONARY STENT INTERVENTION;  Surgeon: Wellington Hampshire, MD;  Location: [place] [facility];  Service: Cardiovascular;  Laterality: N/A;  ? LEFT HEART CATH AND CORONARY ANGIOGRAPHY N/A 02/22/2021  ? Procedure: LEFT HEART CATH AND CORONARY ANGIOGRAPHY;  Surgeon: Wellington Hampshire, MD;  Location: [place] [facility];  Service: Cardiovascular;  Laterality: N/A;  ? NECK SURGERY N/A 10/2003  ? SHOULDER SURGERY Left 2004  ? ? ?Current Medications: ?Current Meds  ?Medication Sig  ? albuterol (PROVENTIL) (2.5 MG/3ML) 0.083% nebulizer solution Take 3 mLs (2.5 mg total) by nebulization every 4 (four) hours as needed for wheezing or shortness of breath.  ? aspirin 81 MG EC tablet TAKE 1 TABLET BY MOUTH EVERY DAY  ? atorvastatin (LIPITOR) 40 MG tablet Take 2 tablets (80 mg total) by mouth daily.  ? BRILINTA 90 MG TABS tablet TAKE ONE TABLET BY MOUTH EVERY MORNING and TAKE ONE TABLET BY MOUTH EVERYDAY AT BEDTIME  ? citalopram (CELEXA) 20 MG tablet Take 1.5 tablets (30 mg total) by mouth every evening.  ? fluticasone (FLONASE) 50 MCG/ACT nasal spray PLACE TWO SPRAYS into BOTH nostrils DAILY  ? Fluticasone-Umeclidin-Vilant (TRELEGY ELLIPTA) 100-62.5-25 MCG/ACT AEPB Inhale 1 puff into the lungs daily.  ? isosorbide mononitrate (IMDUR) 30 MG 24 hr tablet Take 0.5 tablets (15 mg total) by mouth  daily.  ? loratadine (ALLERGY RELIEF) 10 MG tablet Take 1 tablet (10 mg total) by mouth daily.  ? losartan (COZAAR) 100 MG tablet Take 1 tablet (100 mg total) by mouth every evening.  ? metoprolol succinate (TOPROL-XL) 25 MG 24 hr tablet Take 1 tablet (25 mg total) by mouth every evening.  ? NARCAN 4 MG/0.1ML LIQD nasal spray kit 1 spray as needed. As needed in case of overdose  ? pantoprazole (PROTONIX) 40 MG tablet Take 1 tablet (40 mg total) by mouth every evening.  ? traMADol (ULTRAM) 50 MG tablet Take 1 tablet (50 mg total) by mouth 3 (three) times daily.  ? VENTOLIN HFA 108 (90 Base) MCG/ACT inhaler INHALE 1-2 PUFFS BY MOUTH into THE lungs EVERY 6 HOURS AS NEEDED FOR FOR WHEEZING AND/OR SHORTNESS OF BREATH  ? ? ?Allergies:   Levofloxacin, Penicillins, Meloxicam, and Nsaids  ? ?Social History  ? ?Socioeconomic History  ? Marital status: Widowed  ?  Spouse name: Not on file  ? Number of children:  3  ? Years of education: Not on file  ? Highest education level: Some college, no degree  ?Occupational History  ? Occupation: disabled   ?  Comment: 2007 - DDD cervical spine  ?Tobacco Use  ? Smoking status: Every Day  ?  Packs/day: 0.25  ?  Years: 50.00  ?  Pack years: 12.50  ?  Types: Cigarettes  ?  Start date: 12/10/1973  ? Smokeless tobacco: Never  ? Tobacco comments:  ?  1-2 ciggs per day - 04/13/2021; has a hx of 2 PPD x 10 years   ?Vaping Use  ? Vaping Use: Never used  ?Substance and Sexual Activity  ? Alcohol use: Yes  ?  Alcohol/week: 0.0 standard drinks  ?  Comment: occasionally  ? Drug use: No  ? Sexual activity: Not Currently  ?  Partners: Male  ?Other Topics Concern  ? Not on file  ?Social History Narrative  ? Lives in a senior citizen complex and goes out with her friends.  ? Her daughter died due to heart issues on 2018-10-30 at the age of 26.  ? Both sons live in town and are helping her out   ? ?Social Determinants of Health  ? ?Financial Resource Strain: Low Risk   ? Difficulty of Paying Living  Expenses: Not hard at all  ?Food Insecurity: No Food Insecurity  ? Worried About Charity fundraiser in the Last Year: Never true  ? Ran Out of Food in the Last Year: Never true  ?Transportation Needs: Unmet Transportation Ne

## 2021-10-11 NOTE — Telephone Encounter (Signed)
Spoke with patient to follow up per Dr. Ancil Boozer. Patient stated she was feeling better so far and would be contacting Cardiology for scheduling as soon as we hung up. She stated she had intended to earlier but had fallen asleep in her recliner. Provider notified.  ?

## 2021-10-12 ENCOUNTER — Ambulatory Visit (INDEPENDENT_AMBULATORY_CARE_PROVIDER_SITE_OTHER): Payer: Medicare Other | Admitting: Physician Assistant

## 2021-10-12 ENCOUNTER — Telehealth: Payer: Self-pay

## 2021-10-12 ENCOUNTER — Encounter: Payer: Self-pay | Admitting: Physician Assistant

## 2021-10-12 VITALS — BP 140/80 | HR 69 | Ht <= 58 in | Wt 131.0 lb

## 2021-10-12 DIAGNOSIS — J449 Chronic obstructive pulmonary disease, unspecified: Secondary | ICD-10-CM

## 2021-10-12 DIAGNOSIS — I10 Essential (primary) hypertension: Secondary | ICD-10-CM | POA: Diagnosis not present

## 2021-10-12 DIAGNOSIS — Z720 Tobacco use: Secondary | ICD-10-CM

## 2021-10-12 DIAGNOSIS — I2510 Atherosclerotic heart disease of native coronary artery without angina pectoris: Secondary | ICD-10-CM | POA: Diagnosis not present

## 2021-10-12 DIAGNOSIS — R0609 Other forms of dyspnea: Secondary | ICD-10-CM

## 2021-10-12 DIAGNOSIS — E785 Hyperlipidemia, unspecified: Secondary | ICD-10-CM

## 2021-10-12 DIAGNOSIS — I5032 Chronic diastolic (congestive) heart failure: Secondary | ICD-10-CM

## 2021-10-12 NOTE — Patient Instructions (Signed)
Pick up Nicotine Lozenges.  ? ?Medication Instructions:  ?No changes at this time.  ? ?*If you need a refill on your cardiac medications before your next appointment, please call your pharmacy* ? ? ?Lab Work: ?None ? ?If you have labs (blood work) drawn today and your tests are completely normal, you will receive your results only by: ?MyChart Message (if you have MyChart) OR ?A paper copy in the mail ?If you have any lab test that is abnormal or we need to change your treatment, we will call you to review the results. ? ? ?Testing/Procedures: ?Your physician has requested that you have an echocardiogram. Echocardiography is a painless test that uses sound waves to create images of your heart. It provides your doctor with information about the size and shape of your heart and how well your heart?s chambers and valves are working. This procedure takes approximately one hour. There are no restrictions for this procedure. ? ? ? ?Follow-Up: ?At [facility], you and your health needs are our priority.  As part of our continuing mission to provide you with exceptional heart care, we have created designated Provider Care Teams.  These Care Teams include your primary Cardiologist (physician) and Advanced Practice Providers (APPs -  Physician Assistants and Nurse Practitioners) who all work together to provide you with the care you need, when you need it. ? ? ?Your next appointment:   ?2 month(s) ? ?The format for your next appointment:   ?In Person ? ?Provider:   ?Kathlyn [place], MD or Christell Faith, PA-C  ? ? ? ? ?Important Information About Sugar ? ? ? ? ?  ?

## 2021-10-12 NOTE — Progress Notes (Signed)
? ? ?  Chronic Care Management ?Pharmacy Assistant  ? ?Name: Terri Burns  MRN: [id number] DOB: 1951-03-15 ? ?PRIOR AUTHORIZATION FOR ATORVASTATIN 40 MG TABLET ? ?I spoke with [facility] and they advised that the patient's insurance denied her prescription for Atorvastatin 40 mg twice daily as they on cover this medication to be taken once daily.  ? ?The patient was taking Atorvastatin 80 mg once daily but the size of the pill was too big for her to swallow. The patient requested to try the 40 mg tablet twice daily.  ? ?I completed the PA to be sent to the patient's insurance via covermymeds. The PA Case ID: [id number]. [facility] has been informed of the PA submission and the status of approval will be sent via fax to [facility].  ? ?CPP has been notified and a request for a back up plan has been asked just in case the patient's insurance does not approved the PA. ? ?Medications: ?Outpatient Encounter Medications as of 10/12/2021  ?Medication Sig  ? albuterol (PROVENTIL) (2.5 MG/3ML) 0.083% nebulizer solution Take 3 mLs (2.5 mg total) by nebulization every 4 (four) hours as needed for wheezing or shortness of breath.  ? aspirin 81 MG EC tablet TAKE 1 TABLET BY MOUTH EVERY DAY  ? atorvastatin (LIPITOR) 40 MG tablet Take 2 tablets (80 mg total) by mouth daily.  ? BRILINTA 90 MG TABS tablet TAKE ONE TABLET BY MOUTH EVERY MORNING and TAKE ONE TABLET BY MOUTH EVERYDAY AT BEDTIME  ? citalopram (CELEXA) 20 MG tablet Take 1.5 tablets (30 mg total) by mouth every evening.  ? fluticasone (FLONASE) 50 MCG/ACT nasal spray PLACE TWO SPRAYS into BOTH nostrils DAILY  ? Fluticasone-Umeclidin-Vilant (TRELEGY ELLIPTA) 100-62.5-25 MCG/ACT AEPB Inhale 1 puff into the lungs daily.  ? isosorbide mononitrate (IMDUR) 30 MG 24 hr tablet Take 0.5 tablets (15 mg total) by mouth daily.  ? loratadine (ALLERGY RELIEF) 10 MG tablet Take 1 tablet (10 mg total) by mouth daily.  ? losartan (COZAAR) 100 MG tablet Take 1 tablet  (100 mg total) by mouth every evening.  ? metoprolol succinate (TOPROL-XL) 25 MG 24 hr tablet Take 1 tablet (25 mg total) by mouth every evening.  ? NARCAN 4 MG/0.1ML LIQD nasal spray kit 1 spray as needed. As needed in case of overdose  ? pantoprazole (PROTONIX) 40 MG tablet Take 1 tablet (40 mg total) by mouth every evening.  ? predniSONE (DELTASONE) 10 MG tablet Take 1 tablet (10 mg total) by mouth 2 (two) times daily with a meal.  ? traMADol (ULTRAM) 50 MG tablet Take 1 tablet (50 mg total) by mouth 3 (three) times daily.  ? VENTOLIN HFA 108 (90 Base) MCG/ACT inhaler INHALE 1-2 PUFFS BY MOUTH into THE lungs EVERY 6 HOURS AS NEEDED FOR FOR WHEEZING AND/OR SHORTNESS OF BREATH  ? ?No facility-administered encounter medications on file as of 10/12/2021.  ? ?Lynann Bologna, CPA/CMA ?Clinical Pharmacist Assistant ?Phone: [phone]  ? ?

## 2021-10-13 ENCOUNTER — Ambulatory Visit (INDEPENDENT_AMBULATORY_CARE_PROVIDER_SITE_OTHER): Payer: Medicare Other

## 2021-10-13 DIAGNOSIS — I2510 Atherosclerotic heart disease of native coronary artery without angina pectoris: Secondary | ICD-10-CM

## 2021-10-13 LAB — ECHOCARDIOGRAM COMPLETE
AR max vel: 2.58 cm2
AV Area VTI: 2.54 cm2
AV Area mean vel: 2.36 cm2
AV Mean grad: 3 mmHg
AV Peak grad: 5.7 mmHg
AV Vena cont: 0.4 cm
Ao pk vel: 1.19 m/s
Area-P 1/2: 2.88 cm2
Calc EF: 67 %
P 1/2 time: 559 msec
S' Lateral: 2.4 cm
Single Plane A2C EF: 75.2 %
Single Plane A4C EF: 56 %

## 2021-10-14 ENCOUNTER — Other Ambulatory Visit: Payer: Self-pay | Admitting: *Deleted

## 2021-10-14 ENCOUNTER — Telehealth: Payer: Self-pay | Admitting: Cardiovascular Disease

## 2021-10-14 DIAGNOSIS — R072 Precordial pain: Secondary | ICD-10-CM

## 2021-10-14 NOTE — Telephone Encounter (Signed)
Called patient to schedule myoview ?No answer, No VM  ?

## 2021-10-15 ENCOUNTER — Other Ambulatory Visit: Payer: Self-pay | Admitting: Family Medicine

## 2021-10-15 DIAGNOSIS — G8929 Other chronic pain: Secondary | ICD-10-CM

## 2021-10-15 DIAGNOSIS — F1190 Opioid use, unspecified, uncomplicated: Secondary | ICD-10-CM

## 2021-10-15 NOTE — Telephone Encounter (Signed)
Copied from [place] [id number]. Topic: General - Other ?>> Oct 15, 2021  4:31 PM Tessa Lerner A wrote: ?Reason for CRM: Medication Refill - Medication: traMADol (ULTRAM) 50 MG tablet [[id number]] ? ?Has the patient contacted their pharmacy? Yes.  The patient has requested the medication previously with difficulty and would like it sent to this pharmacy  ?(Agent: If no, request that the patient contact the pharmacy for the refill. If patient does not wish to contact the pharmacy document the reason why and proceed with request.) ?(Agent: If yes, when and what did the pharmacy advise?) ? ?Preferred Pharmacy (with phone number or street name): [id number] [place] [address] [place]?Phone: [phone]: [phone]?Hours: Not open 24 hours ? ? ?Has the patient been seen for an appointment in the last year OR does the patient have an upcoming appointment? Yes.   ? ?Agent: Please be advised that RX refills may take up to 3 business days. We ask that you follow-up with your pharmacy. ?

## 2021-10-18 ENCOUNTER — Ambulatory Visit: Payer: Self-pay

## 2021-10-18 NOTE — Telephone Encounter (Signed)
Requested medication (s) are due for refill today: no ? ?Requested medication (s) are on the active medication list: yes ? ?Last refill:  10/05/20 #90 2 RF ? ?Future visit scheduled: yes ? ?Notes to clinic:  disregard- [facility] will deliver to pt today ? ? ?Requested Prescriptions  ?Pending Prescriptions Disp Refills  ? traMADol (ULTRAM) 50 MG tablet 90 tablet 2  ?  Sig: Take 1 tablet (50 mg total) by mouth 3 (three) times daily.  ?  ? Not Delegated - Analgesics:  Opioid Agonists Failed - 10/18/2021 12:07 PM  ?  ?  Failed - This refill cannot be delegated  ?  ?  Failed - Urine Drug Screen completed in last 360 days  ?  ?  Passed - Valid encounter within last 3 months  ?  Recent Outpatient Visits   ? ?      ? 1 week ago Centrilobular emphysema ([place])  ? [facility] [place], Drue Stager, MD  ? 3 months ago Senile purpura [facility])  ? [facility] [place], Drue Stager, MD  ? 6 months ago Chronic neck pain  ? [facility] [place], Drue Stager, MD  ? 6 months ago Centrilobular emphysema [facility])  ? [place], DO  ? 7 months ago Non-ST elevation (NSTEMI) myocardial infarction [facility])  ? [place], DO  ? ?  ?  ?Future Appointments   ? ?        ? In 1 month Dunn, Areta Haber, PA-C [facility]  ? In 2 months Steele Sizer, MD [facility] [place]  ? In 8 months  [place]  ? ?  ? ? ?  ?  ?  ? ? ? ? ?

## 2021-10-18 NOTE — Telephone Encounter (Signed)
Pt is patiently waiting on refill request. Pt states that she did not receive the intial refill request on 10/05/21 from [facility]. Pt is asking if the medication can please be resent. ?

## 2021-10-18 NOTE — Telephone Encounter (Signed)
Requested medication (s) are due for refill today: no ? ?Requested medication (s) are on the active medication list: yes ? ?Last refill: 10/05/20 #90 2 RF ? ? ?Future visit scheduled: yes ? ?Notes to clinic:  DUPLICATE REQUEST- Please disregard this refill request. Spoke with [employer] [employer] and a delivery will be made today.  ? ? ?Requested Prescriptions  ?Pending Prescriptions Disp Refills  ? traMADol (ULTRAM) 50 MG tablet 90 tablet 2  ?  Sig: Take 1 tablet (50 mg total) by mouth 3 (three) times daily.  ?  ? Not Delegated - Analgesics:  Opioid Agonists Failed - 10/18/2021 12:07 PM  ?  ?  Failed - This refill cannot be delegated  ?  ?  Failed - Urine Drug Screen completed in last 360 days  ?  ?  Passed - Valid encounter within last 3 months  ?  Recent Outpatient Visits   ? ?      ? 1 week ago Centrilobular emphysema ([place])  ? [facility] [place], Drue Stager, MD  ? 3 months ago Senile purpura [facility])  ? [facility] [place], Drue Stager, MD  ? 6 months ago Chronic neck pain  ? [facility] [place], Drue Stager, MD  ? 6 months ago Centrilobular emphysema [facility])  ? [place], DO  ? 7 months ago Non-ST elevation (NSTEMI) myocardial infarction [facility])  ? [place], DO  ? ?  ?  ?Future Appointments   ? ?        ? In 1 month Dunn, Areta Haber, PA-C [facility]  ? In 2 months Steele Sizer, MD [facility], [place]  ? In 8 months  [place]  ? ?  ? ? ?  ?  ?  ? ? ? ? ?

## 2021-10-18 NOTE — Telephone Encounter (Signed)
Spoke with [employer] [employer] and was advised they will deliver today, just as the pt advised.  ? ?Called pt and advised the office got her message. Pt verbalized understanding. ?

## 2021-10-27 ENCOUNTER — Ambulatory Visit: Payer: Medicare Other

## 2021-11-02 ENCOUNTER — Encounter: Payer: Medicare Other | Attending: Physician Assistant

## 2021-11-03 ENCOUNTER — Other Ambulatory Visit: Payer: Self-pay | Admitting: Cardiovascular Disease

## 2021-11-04 ENCOUNTER — Telehealth: Payer: Self-pay

## 2021-11-04 NOTE — Progress Notes (Signed)
Chronic Care Management Pharmacy Assistant   Name: Terri Burns  MRN: [id number] DOB: Mar 06, 1951  Reason for Encounter: Medication Review/Medication Coordination Call for [facility]   Recent office visits:  10/05/2021 Steele Sizer, MD (PCP Office Visit) for Follow-up- Stopped: Fluconazole 150 mg, Changed: Tramadol HCl 50 mg twice daily to three times daily, Started: Trelegy Inhaler 100-62.5-25 mcg 1 puff daily, Mentioned in PCP noted on 10/05/2021 We will decrease dose to 20 mg daily instead of 30 mg daily since we are increasing dose of Tramadol but it appears the decrease was not put into place will notify CPP, Lab orders placed, Referral to Urology placed, Patient to follow-up in 3 months  Recent consult visits:  10/12/2021 Christell Faith, PA-C (Cardiology) for Office Visit per PCP Request- Stopped: Prednisone 10 mg, Echocardiogram completed order placed, EKG 12-Lead order placed, Patient to follow-up in 2 months  Hospital visits:  None in previous 6 months  Medications: Outpatient Encounter Medications as of 11/04/2021  Medication Sig   BRILINTA 90 MG TABS tablet TAKE ONE TABLET BY MOUTH EVERY MORNING and TAKE ONE TABLET BY MOUTH EVERYDAY AT BEDTIME   albuterol (PROVENTIL) (2.5 MG/3ML) 0.083% nebulizer solution Take 3 mLs (2.5 mg total) by nebulization every 4 (four) hours as needed for wheezing or shortness of breath.   aspirin 81 MG EC tablet TAKE 1 TABLET BY MOUTH EVERY DAY   atorvastatin (LIPITOR) 40 MG tablet Take 2 tablets (80 mg total) by mouth daily.   citalopram (CELEXA) 20 MG tablet Take 1.5 tablets (30 mg total) by mouth every evening.   fluticasone (FLONASE) 50 MCG/ACT nasal spray PLACE TWO SPRAYS into BOTH nostrils DAILY   Fluticasone-Umeclidin-Vilant (TRELEGY ELLIPTA) 100-62.5-25 MCG/ACT AEPB Inhale 1 puff into the lungs daily.   isosorbide mononitrate (IMDUR) 30 MG 24 hr tablet Take 0.5 tablets (15 mg total) by mouth daily.   loratadine (ALLERGY RELIEF) 10 MG  tablet Take 1 tablet (10 mg total) by mouth daily.   losartan (COZAAR) 100 MG tablet Take 1 tablet (100 mg total) by mouth every evening.   metoprolol succinate (TOPROL-XL) 25 MG 24 hr tablet Take 1 tablet (25 mg total) by mouth every evening.   NARCAN 4 MG/0.1ML LIQD nasal spray kit 1 spray as needed. As needed in case of overdose   pantoprazole (PROTONIX) 40 MG tablet Take 1 tablet (40 mg total) by mouth every evening.   traMADol (ULTRAM) 50 MG tablet Take 1 tablet (50 mg total) by mouth 3 (three) times daily.   VENTOLIN HFA 108 (90 Base) MCG/ACT inhaler INHALE 1-2 PUFFS BY MOUTH into THE lungs EVERY 6 HOURS AS NEEDED FOR FOR WHEEZING AND/OR SHORTNESS OF BREATH   No facility-administered encounter medications on file as of 11/04/2021.   Care Gaps: Mammogram  Dexa Scan Zoster Vaccine  Star Rating Drugs: Atorvastatin 80 mg last filled on 10/14/2021 for a 30-Day supply with [facility] Losartan 100 mg last filled on 10/14/2021 for a 30-Day supply with [facility]  BP Readings from Last 3 Encounters:  10/12/21 140/80  10/05/21 118/78  07/13/21 138/80    No results found for: HGBA1C   Patient obtains medications through Adherence Packaging  30 Days   Last adherence delivery included:  Trelegy Ellipta 100-62.5-25 mcg inhale 1 puff into lungs daily Isosorbide Mononitrate 30 mg Take 1/2 tablet daily (Breakfast) Brilinta 90 mg 1 tablet twice daily (Breakfast, Bedtime) Citalopram 20 mg 1.5 tablets daily (Evening Meal) Atorvastatin 40 mg 2 tablet daily (Breakfast) Metoprolol Succinate  25 mg 1 tablet daily (Evening Meal) Losartan 100 mg 1 tablet daily (Evening Meal) Pantoprazole 40 mg 1 tablet daily (Evening Meal) Loratadine 10 mg 1 tablet daily (Breakfast) Fluticasone (Flonase) Nasal Spray 50 mcg 2 sprays in both nostrils daily Albuterol Inhaler 108 Inhale 1-2 puffs every six hours prn for wheezing or shortness of breath- PRN Tramadol 50 mg 1 tablet three times daily  (Breakfast, Lunch, Bedtime) ????  Patient declined the following medications: Tramadol 50 mg tablet [facility]-Patient has appointment with PCP today and she will ask her to send future refills to [facility]. [facility] so the next refill we should have and we can deliver to her.  Patient is due for next adherence delivery on: 11/16/2021 (Tuesday) [facility] [facility].  Called patient and reviewed medications and coordinated delivery.  This delivery to include: Trelegy Ellipta 100-62.5-25 mcg inhale 1 puff into lungs daily Isosorbide Mononitrate 30 mg Take 1/2 tablet daily (Breakfast) Brilinta 90 mg 1 tablet twice daily (Breakfast, Bedtime) Citalopram 20 mg 1.5 tablets daily (Evening Meal) Atorvastatin 40 mg 2 tablet daily (Breakfast) Metoprolol Succinate 25 mg 1 tablet daily (Evening Meal) Losartan 100 mg 1 tablet daily (Evening Meal) Pantoprazole 40 mg 1 tablet daily (Evening Meal) Loratadine 10 mg 1 tablet daily (Breakfast) Fluticasone (Flonase) Nasal Spray 50 mcg 2 sprays in both nostrils daily Albuterol Inhaler 108 Inhale 1-2 puffs every six hours prn for wheezing or shortness of breath- PRN Tramadol 50 mg 1 tablet three times daily (Breakfast, Lunch, Bedtime)   Patient declined the following medications: No Medications were declined  Patient needs refills for Isosorbide Mono ER 30 mg, and Brilinta 90 mg both are Specialist Medications and [facility] has sent the refill request. Patient is also asking about refills for her Albuterol 2.5 mg for her nebulizer machine. [facility] due to [facility] can't fill it through her Insurance. [facility] and [facility] advised patient does have refills and they will get this medication ready for her.  Confirmed delivery date of 11/16/2021, advised patient that pharmacy will contact them the morning of delivery.  I spoke to the patient regarding her shortness of breath  and she advised this morning she is feeling better. Patient stated that she has a harder time breathing when the humidity is high, and she also advised Cardio informed her that Brilinta cause shortness of breath as well. Patient has no additional concerns or issues today. Patient encouraged to call me if she needs anything.  Patient has a telephone appointment with [name], CPP on 01/05/2022 @ 1500.  Lynann Bologna, CPA/CMA Clinical Pharmacist Assistant Phone: [phone]

## 2021-11-19 ENCOUNTER — Encounter: Admission: RE | Admit: 2021-11-19 | Payer: Medicare Other | Source: Ambulatory Visit

## 2021-12-01 ENCOUNTER — Other Ambulatory Visit: Payer: Self-pay | Admitting: Cardiovascular Disease

## 2021-12-01 ENCOUNTER — Telehealth: Payer: Self-pay

## 2021-12-01 DIAGNOSIS — J3089 Other allergic rhinitis: Secondary | ICD-10-CM

## 2021-12-01 NOTE — Progress Notes (Signed)
Chronic Care Management Pharmacy Assistant   Name: Terri Burns  MRN: [id number] DOB: [birth year]  Reason for Encounter: Medication Review/Medication Coordination Call for [facility]  Recent office visits:  None ID  Recent consult visits:  None ID  Hospital visits:  None in previous 6 months  Medications: Outpatient Encounter Medications as of 12/01/2021  Medication Sig   albuterol (PROVENTIL) (2.5 MG/3ML) 0.083% nebulizer solution Take 3 mLs (2.5 mg total) by nebulization every 4 (four) hours as needed for wheezing or shortness of breath.   aspirin 81 MG EC tablet TAKE 1 TABLET BY MOUTH EVERY DAY   atorvastatin (LIPITOR) 40 MG tablet Take 2 tablets (80 mg total) by mouth daily.   BRILINTA 90 MG TABS tablet TAKE ONE TABLET BY MOUTH EVERY MORNING and TAKE ONE TABLET BY MOUTH EVERYDAY AT BEDTIME   citalopram (CELEXA) 20 MG tablet Take 1.5 tablets (30 mg total) by mouth every evening.   fluticasone (FLONASE) 50 MCG/ACT nasal spray PLACE TWO SPRAYS into BOTH nostrils DAILY   Fluticasone-Umeclidin-Vilant (TRELEGY ELLIPTA) 100-62.5-25 MCG/ACT AEPB Inhale 1 puff into the lungs daily.   isosorbide mononitrate (IMDUR) 30 MG 24 hr tablet Take 0.5 tablets (15 mg total) by mouth daily.   loratadine (ALLERGY RELIEF) 10 MG tablet Take 1 tablet (10 mg total) by mouth daily.   losartan (COZAAR) 100 MG tablet Take 1 tablet (100 mg total) by mouth every evening.   metoprolol succinate (TOPROL-XL) 25 MG 24 hr tablet Take 1 tablet (25 mg total) by mouth every evening.   NARCAN 4 MG/0.1ML LIQD nasal spray kit 1 spray as needed. As needed in case of overdose   pantoprazole (PROTONIX) 40 MG tablet Take 1 tablet (40 mg total) by mouth every evening.   traMADol (ULTRAM) 50 MG tablet Take 1 tablet (50 mg total) by mouth 3 (three) times daily.   VENTOLIN HFA 108 (90 Base) MCG/ACT inhaler INHALE 1-2 PUFFS BY MOUTH into THE lungs EVERY 6 HOURS AS NEEDED FOR FOR WHEEZING AND/OR SHORTNESS OF BREATH    No facility-administered encounter medications on file as of 12/01/2021.   Care Gaps: Mammogram  Dexa Scan Zoster Vaccine  Star Rating Drugs: Atorvastatin 80 mg last filled on 11/10/2021 for a 30-Day supply with [facility] Losartan 100 mg last filled on 11/10/2021 for a 30-Day supply with [facility]  BP Readings from Last 3 Encounters:  10/12/21 140/80  10/05/21 118/78  07/13/21 138/80    No results found for: "HGBA1C"   Patient obtains medications through Adherence Packaging  30 Days   Last adherence delivery included: Trelegy Ellipta 100-62.5-25 mcg inhale 1 puff into lungs daily Isosorbide Mononitrate 30 mg Take 1/2 tablet daily (Breakfast) Brilinta 90 mg 1 tablet twice daily (Breakfast, Bedtime) Citalopram 20 mg 1.5 tablets daily (Evening Meal) Atorvastatin 40 mg 2 tablet daily (Breakfast) Metoprolol Succinate 25 mg 1 tablet daily (Evening Meal) Losartan 100 mg 1 tablet daily (Evening Meal) Pantoprazole 40 mg 1 tablet daily (Evening Meal) Loratadine 10 mg 1 tablet daily (Breakfast) Fluticasone (Flonase) Nasal Spray 50 mcg 2 sprays in both nostrils daily Albuterol Inhaler 108 Inhale 1-2 puffs every six hours prn for wheezing or shortness of breath- PRN Tramadol 50 mg 1 tablet three times daily (Breakfast, Lunch, Bedtime)   Patient declined the following medications: No Medications were declined  Patient is due for next adherence delivery on: 12/15/2021 (Wednesday) [facility].  Called patient and reviewed medications and coordinated delivery.  This delivery to include: Trelegy Ellipta 100-62.5-25  mcg inhale 1 puff into lungs daily Isosorbide Mononitrate 30 mg Take 1/2 tablet daily (Breakfast) Brilinta 90 mg 1 tablet twice daily (Breakfast, Bedtime) Citalopram 20 mg 1.5 tablets daily (Evening Meal) Atorvastatin 40 mg 2 tablet daily (Breakfast) Metoprolol Succinate 25 mg 1 tablet daily (Evening Meal) Losartan 100 mg 1 tablet daily (Evening  Meal) Pantoprazole 40 mg 1 tablet daily (Evening Meal) Loratadine 10 mg 1 tablet daily (Breakfast) Tramadol 50 mg 1 tablet three times daily (Breakfast, Lunch, Bedtime)   Patient declined the following medications: Fluticasone (Flonase) Nasal Spray 50 mcg 2 sprays in both nostrils daily Albuterol Inhaler 108 Inhale 1-2 puffs every six hours prn for wheezing or shortness of breath- PRN  Patient needs refills for: Isosorbide Mono ER 30 mg, Brilinta 90 mg (Specialist medications have been requested by [facility]) Fluticasone Nasal Spray (PCP medication that CPP can refill).  Confirmed delivery date of 12/15/2021 2nd Route, advised patient that pharmacy will contact them the morning of delivery.  Patient has a telephone follow-up appointment with [name], CPP on 01/19/2022 @ 1400.  Lynann Bologna, CPA/CMA Clinical Pharmacist Assistant Phone: [phone]

## 2021-12-03 MED ORDER — FLUTICASONE PROPIONATE 50 MCG/ACT NA SUSP: NASAL | 1 refills | Status: DC

## 2021-12-03 NOTE — Addendum Note (Signed)
Addended by: Daron Offer A on: 12/03/2021 11:41 AM   Modules accepted: Orders

## 2021-12-05 ENCOUNTER — Other Ambulatory Visit: Payer: Self-pay | Admitting: Family Medicine

## 2021-12-08 ENCOUNTER — Other Ambulatory Visit: Payer: Self-pay | Admitting: Family Medicine

## 2021-12-08 DIAGNOSIS — F3342 Major depressive disorder, recurrent, in full remission: Secondary | ICD-10-CM

## 2021-12-10 NOTE — Progress Notes (Deleted)
Cardiology Office Note    Date:  12/10/2021   ID:  Terri, Burns 1951/03/04, MRN [id number]  PCP:  Steele Sizer, MD  Cardiologist:  Kathlyn Sacramento, MD  Electrophysiologist:  None   Chief Complaint: Follow up  History of Present Illness:   Terri STRENG is a 71 y.o. female with history of CAD with NSTEMI in 02/2021 status post PCI/DES to the proximal LAD, HFpEF, HTN, HLD, and COPD with ongoing tobacco use who presents for follow up of echo.   She was admitted to the hospital in 02/2021 with an NSTEMI.  Echo showed an EF of 55 to 56%, grade 1 diastolic dysfunction, normal RV systolic function and ventricular cavity size, mild to moderate aortic valve sclerosis without evidence of stenosis.  LHC on 02/22/2021 showed severe one-vessel CAD with 99% thrombotic stenosis of the proximal LAD which was the culprit lesion for her NSTEMI.  In addition, there was mild to moderate LCx and RCA disease as outlined below.  Moderately to severely reduced LV systolic function with an EF of 25 to 30% with severe anterior/apical hypokinesis on LV gram was noted.  Severely elevated LVEDP estimated at 40 mmHg.  She underwent successful balloon angioplasty, aspiration thrombectomy, and drug-eluting stent placement to the proximal LAD.  The initial plan was to use intravascular lithotripsy given calcifications, however the patient developed chest pain with sluggish flow in the LAD just before PCI was started.  She was subsequently diuresed and medications were optimized.  Repeat echo in 04/2021, given inconsistency noted with prior echo and LV gram, showed an EF of 55 to 60%, no regional wall motion abnormalities, grade 1 diastolic dysfunction, normal RV systolic function and ventricular cavity size, mild to moderate mitral regurgitation, trivial aortic insufficiency, and an estimated right atrial pressure of 3 mmHg.  She was seen in 04/2021 with noted exertional dyspnea with BNP of 156 at that time.  She was last  seen in the office in 06/2021 and was without symptoms of angina or decompensation.   More recent, she was evaluated by her PCP on 10/05/2021 noting dyspnea following vulvar pruritus that has persisted and noted to be with exertion.  During the initial episode, she also felt flushed and sweaty.  Note indicates EMS was called with EKG noted to be "normal" (unavailable for review) and had a glucose of 106, up to 160 following orange juice.  She also reported orthopnea and had been sleeping in a recliner.  Labs obtained through PCPs office at that time included a normal BNP of 83, normal high-sensitivity troponin of 3, elevated total CK 162 with a CK-MB of 5.7.  She was treated with prednisone.  Repeat labs obtained on 10/08/2021 again showed a normal high-sensitivity troponin of 3 with an uptrending CK of 214 and a CK-MB of 8.8.  Given these findings, PCP recommended the patient proceed to the ED.  However, patient reported she was feeling better and appointment was scheduled with [place].  She was last seen in the office on 10/12/2021 and reported she developed sudden onset of dyspnea without chest pain on 09/29/2021.  She also reported an episode of hypoglycemia at that time.  She indicates this subsequently led to a panic attack.  With this, she was evaluated by EMS, though was not transported to the ED.  She subsequently followed up with her PCP with blood work as outlined above.  Since the episode, she had been back to baseline with chronic stable dyspnea.  Symptoms did  not feel like her prior NSTEMI.  It was recommended she pursue Lexiscan MPI, though this was canceled.  Echo on 10/13/2021 demonstrated an EF of 60 to 65%, no regional wall motion abnormalities, grade 1 diastolic dysfunction, normal RV systolic function and ventricular cavity size, mild mitral regurgitation, trivial aortic insufficiency, and an estimated right atrial pressure of 8 mmHg.  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    Hyperlipidemia    Hypertension    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    Past Surgical History:  Procedure Laterality Date   CORONARY STENT INTERVENTION N/A 02/22/2021   Procedure: CORONARY STENT INTERVENTION;  Surgeon: Wellington Hampshire, MD;  Location: [facility];  Service: Cardiovascular;  Laterality: N/A;   LEFT HEART CATH AND CORONARY ANGIOGRAPHY N/A 02/22/2021   Procedure: LEFT HEART CATH AND CORONARY ANGIOGRAPHY;  Surgeon: Wellington Hampshire, MD;  Location: [facility];  Service: Cardiovascular;  Laterality: N/A;   NECK SURGERY N/A 10/2003   SHOULDER SURGERY Left 2004    Current Medications: No outpatient medications have been marked as taking for the 12/13/21 encounter (Appointment) with Rise Mu, PA-C.    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  Tobacco Use   Smoking status: Every Day    Packs/day: 0.25    Years: 50.00    Total pack years: 12.50    Types: Cigarettes    Start date: 12/10/1973   Smokeless tobacco: Never   Tobacco comments:    1-2 ciggs per day - 04/13/2021; has a hx of 2 PPD x 10 years   Vaping Use   Vaping Use: Never used  Substance and Sexual Activity   Alcohol use: Yes     Alcohol/week: 0.0 standard drinks of alcohol    Comment: occasionally   Drug use: No   Sexual activity: Not Currently    Partners: Male  Other Topics Concern   Not on file  Social History Narrative   Lives in a senior citizen complex and goes out with her friends.   Her daughter died due to heart issues on 10/15/18 at the age of 73.   Both sons live in town and are helping her out    Social Determinants of Health   Financial Resource Strain: Low Risk  (07/13/2021)   Overall Financial Resource Strain (CARDIA)    Difficulty of Paying Living Expenses: Not hard at all  Food Insecurity: No Food Insecurity (07/13/2021)   Hunger Vital Sign    Worried About Running Out of Food in the Last Year: Never true    Ran Out of Food in  the Last Year: Never true  Transportation Needs: Unmet Transportation Needs (07/13/2021)   PRAPARE - Hydrologist (Medical): Yes    Lack of Transportation (Non-Medical): No  Physical Activity: Insufficiently Active (07/13/2021)   Exercise Vital Sign    Days of Exercise per Week: 4 days    Minutes of Exercise per Session: 20 min  Stress: No Stress Concern Present (07/13/2021)   [place]    Feeling of Stress : Not at all  Social Connections: Moderately Integrated (07/13/2021)   Social Connection and Isolation Panel [NHANES]    Frequency of Communication with Friends and Family: More than three times a week    Frequency of Social Gatherings with Friends and Family: More than three times a week    Attends Religious Services: More than 4 times per year    Active Member of [employer] or Organizations: Yes    Attends Archivist Meetings: More than 4 times per year    Marital Status: Widowed     Family History:  The patient's family history includes Diabetes in her father; Heart disease in her daughter and father; Hypertension in her mother and son; Pulmonary embolism in her  daughter; Stroke in her mother.  ROS:   ROS   EKGs/Labs/Other Studies Reviewed:    Studies reviewed were summarized above. The additional studies were reviewed today:  2D echo 10/13/2021: 1. Left ventricular ejection fraction, by estimation, is 60 to 65%. Left  ventricular ejection fraction by 2D MOD biplane is 67.0 %. The left  ventricle has normal function. The left ventricle has no regional wall  motion abnormalities. Left ventricular  diastolic parameters are consistent with Grade I diastolic dysfunction  (impaired relaxation). The average left ventricular global longitudinal  strain is -17.5 %.   2. Right ventricular systolic function is normal. The right ventricular  size is normal.   3. The mitral valve is normal in structure. Mild mitral valve  regurgitation.   4. The aortic valve was not well visualized. Aortic valve regurgitation  is trivial.   5. The inferior vena cava is dilated in size with >50% respiratory  variability, suggesting right atrial pressure of 8 mmHg.   Comparison(s): LVEF 55-60%, Mild-Mod MR. __________  2D echo 04/13/2021: 1. Left ventricular ejection fraction, by estimation, is 55 to 60%. The  left ventricle has normal function. The left ventricle has no regional  wall motion abnormalities. Left ventricular diastolic parameters are  consistent with Grade I diastolic  dysfunction (impaired relaxation).   2. Right ventricular systolic function is normal. The right ventricular  size is normal.   3. The mitral valve is normal in structure. Mild to moderate mitral valve  regurgitation. No evidence of mitral stenosis.   4. The aortic valve is normal in structure. Aortic valve regurgitation is  trivial. No aortic stenosis is present.   5. The inferior vena cava is normal in size with greater than 50%  respiratory variability, suggesting right atrial pressure of 3 mmHg.   Comparison(s): LVEF 55-60%. __________   LHC 02/22/2021:   Prox RCA to Mid RCA  lesion is 60% stenosed.   Ost LAD to Prox LAD lesion is 25% stenosed.   Ost Cx to Prox Cx lesion is 40% stenosed.   1st Mrg lesion is 30% stenosed.   Prox LAD lesion is 99% stenosed.   A drug-eluting stent was successfully placed using a STENT ONYX FRONTIER 3.0X18.   Post  intervention, there is a 0% residual stenosis.   There is moderate to severe left ventricular systolic dysfunction.   LV end diastolic pressure is severely elevated.   The left ventricular ejection fraction is 25-35% by visual estimate.   1.  Severe one-vessel coronary artery disease with 99% thrombotic stenosis in the proximal LAD which seems to be the culprit for non-ST elevation myocardial infarction.  In addition, there is mild to moderate left circumflex and RCA disease. 2.  Moderately to severely reduced LV systolic function with an EF of 25 to 30% with severe anterior/apical hypokinesis. 3.  Severely elevated left ventricular end-diastolic pressure at 40 mmHg. 4.  Successful balloon angioplasty, aspiration thrombectomy and drug-eluting stent placement to the proximal LAD.  The initial plan was to use intravascular lithotripsy given calcifications but the patient developed chest pain with sluggish flow in the LAD just before the PCI was started.   Recommendations: Dual antiplatelet therapy for at least 12 months. Aggressive treatment of risk factors. The patient was given 1 dose of IV furosemide 20 mg during the case with good urine output.  Monitor respiratory status and consider additional diuresis as needed. Treat the rest of the coronary artery disease medically. __________   2D echo 02/22/2021: 1. Left ventricular ejection fraction, by estimation, is 55 to 60%. The  left ventricle has normal function. Left ventricular endocardial border  not optimally defined to evaluate regional wall motion. Left ventricular  diastolic parameters are consistent  with Grade I diastolic dysfunction (impaired relaxation).   2.  Right ventricular systolic function is normal. The right ventricular  size is normal. Tricuspid regurgitation signal is inadequate for assessing  PA pressure.   3. The mitral valve is normal in structure. No evidence of mitral valve  regurgitation. No evidence of mitral stenosis.   4. The aortic valve is normal in structure. Aortic valve regurgitation is  not visualized. Mild to moderate aortic valve sclerosis/calcification is  present, without any evidence of aortic stenosis.   5. The inferior vena cava is normal in size with greater than 50%  respiratory variability, suggesting right atrial pressure of 3 mmHg.   EKG:  EKG is ordered today.  The EKG ordered today demonstrates ***  Recent Labs: 02/06/2021: Magnesium 2.3 02/19/2021: ALT 52 10/05/2021: Brain Natriuretic Peptide 83; BUN 14; Creat 0.77; Hemoglobin 12.0; Platelets 335; Potassium 4.4; Sodium 139  Recent Lipid Panel    Component Value Date/Time   CHOL 173 02/21/2020 1453   TRIG 39 02/21/2020 1453   HDL 101 02/21/2020 1453   CHOLHDL 1.7 02/21/2020 1453   LDLCALC 61 02/21/2020 1453   LDLDIRECT 45.5 02/25/2021 0931    PHYSICAL EXAM:    VS:  There were no vitals taken for this visit.  BMI: There is no height or weight on file to calculate BMI.  Physical Exam  Wt Readings from Last 3 Encounters:  10/12/21 131 lb (59.4 kg)  10/05/21 130 lb (59 kg)  07/13/21 129 lb (58.5 kg)     ASSESSMENT & PLAN:   CAD involving the native coronary arteries with***:  HFpEF:  HTN: Blood pressure  HLD: LDL 61.  Chronic dyspnea with COPD and ongoing tobacco use:   {Are you ordering a CV Procedure (e.g. stress test, cath, DCCV, TEE, etc)?   Press F2        :[id number]}     Disposition: F/u with Dr. Fletcher Anon or an APP in ***.   Medication Adjustments/Labs and Tests Ordered: Current medicines are reviewed at  length with the patient today.  Concerns regarding medicines are outlined above. Medication changes, Labs and Tests ordered  today are summarized above and listed in the Patient Instructions accessible in Encounters.   Signed, Christell Faith, PA-C 12/10/2021 10:16 AM     [facility] [address] [place], [place] [address] [phone]

## 2021-12-13 ENCOUNTER — Ambulatory Visit
Admission: RE | Admit: 2021-12-13 | Discharge: 2021-12-13 | Disposition: A | Discharge status: Home / Self Care | Payer: Medicare Other | Source: Ambulatory Visit | Attending: Family Medicine | Admitting: Family Medicine

## 2021-12-13 ENCOUNTER — Other Ambulatory Visit: Payer: Self-pay | Admitting: Family Medicine

## 2021-12-13 ENCOUNTER — Ambulatory Visit: Payer: Medicare Other | Admitting: Physician Assistant

## 2021-12-13 ENCOUNTER — Other Ambulatory Visit: Payer: Self-pay | Admitting: Internal Medicine

## 2021-12-13 ENCOUNTER — Inpatient Hospital Stay: Admission: RE | Admit: 2021-12-13 | Payer: Medicare Other | Source: Ambulatory Visit

## 2021-12-13 DIAGNOSIS — Z1231 Encounter for screening mammogram for malignant neoplasm of breast: Secondary | ICD-10-CM

## 2021-12-20 NOTE — Progress Notes (Deleted)
Cardiology Office Note    Date:  12/20/2021   ID:  Terri, Burns 1950/10/19, MRN [id number]  PCP:  Waylan Rocher, MD  Cardiologist:  Kathlyn Sacramento, MD  Electrophysiologist:  None   Chief Complaint: Follow-up  History of Present Illness:   Terri Burns is a 71 y.o. female with history of CAD with NSTEMI in 02/2021 status post PCI/DES to the proximal LAD, HFpEF, HTN, HLD, and COPD with ongoing tobacco use who presents for follow up of echo.   She was admitted to the hospital in 02/2021 with an NSTEMI.  Echo showed an EF of 55 to 49%, grade 1 diastolic dysfunction, normal RV systolic function and ventricular cavity size, mild to moderate aortic valve sclerosis without evidence of stenosis.  LHC on 02/22/2021 showed severe one-vessel CAD with 99% thrombotic stenosis of the proximal LAD which was the culprit lesion for her NSTEMI.  In addition, there was mild to moderate LCx and RCA disease as outlined below.  Moderately to severely reduced LV systolic function with an EF of 25 to 30% with severe anterior/apical hypokinesis on LV gram was noted.  Severely elevated LVEDP estimated at 40 mmHg.  She underwent successful balloon angioplasty, aspiration thrombectomy, and drug-eluting stent placement to the proximal LAD.  The initial plan was to use intravascular lithotripsy given calcifications, however the patient developed chest pain with sluggish flow in the LAD just before PCI was started.  She was subsequently diuresed and medications were optimized.  Repeat echo in 04/2021, given inconsistency noted with prior echo and LV gram, showed an EF of 55 to 60%, no regional wall motion abnormalities, grade 1 diastolic dysfunction, normal RV systolic function and ventricular cavity size, mild to moderate mitral regurgitation, trivial aortic insufficiency, and an estimated right atrial pressure of 3 mmHg.  She was seen in 04/2021 with noted exertional dyspnea with BNP of 156 at that time.  She was  last seen in the office in 06/2021 and was without symptoms of angina or decompensation.   More recent, she was evaluated by her PCP on 10/05/2021 noting dyspnea following vulvar pruritus that has persisted and noted to be with exertion.  During the initial episode, she also felt flushed and sweaty.  Note indicates EMS was called with EKG noted to be "normal" (unavailable for review) and had a glucose of 106, up to 160 following orange juice.  She also reported orthopnea and had been sleeping in a recliner.  Labs obtained through PCPs office at that time included a normal BNP of 83, normal high-sensitivity troponin of 3, elevated total CK 162 with a CK-MB of 5.7.  She was treated with prednisone.  Repeat labs obtained on 10/08/2021 again showed a normal high-sensitivity troponin of 3 with an uptrending CK of 214 and a CK-MB of 8.8.  Given these findings, PCP recommended the patient proceed to the ED.  However, patient reported she was feeling better and appointment was scheduled with [place].  She was last seen in the office on 10/12/2021 and reported she developed sudden onset of dyspnea without chest pain on 09/29/2021.  She also reported an episode of hypoglycemia at that time.  She indicates this subsequently led to a panic attack.  With this, she was evaluated by EMS, though was not transported to the ED.  She subsequently followed up with her PCP with blood work as outlined above.  Since the episode, she had been back to baseline with chronic stable dyspnea.  Symptoms did  not feel like her prior NSTEMI.  It was recommended she pursue Lexiscan MPI, though this was canceled.  Echo on 10/13/2021 demonstrated an EF of 60 to 65%, no regional wall motion abnormalities, grade 1 diastolic dysfunction, normal RV systolic function and ventricular cavity size, mild mitral regurgitation, trivial aortic insufficiency, and an estimated right atrial pressure of 8 mmHg.  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    Hyperlipidemia    Hypertension    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    Past Surgical History:  Procedure Laterality Date   CORONARY STENT INTERVENTION N/A 02/22/2021   Procedure: CORONARY STENT INTERVENTION;  Surgeon: Wellington Hampshire, MD;  Location: [facility];  Service: Cardiovascular;  Laterality: N/A;   LEFT HEART CATH AND CORONARY ANGIOGRAPHY N/A 02/22/2021   Procedure: LEFT HEART CATH AND CORONARY ANGIOGRAPHY;  Surgeon: Wellington Hampshire, MD;  Location: [facility];  Service: Cardiovascular;  Laterality: N/A;   NECK SURGERY N/A 10/2003   SHOULDER SURGERY Left 2004    Current Medications: No outpatient medications have been marked as taking for the 12/23/21 encounter (Appointment) with Rise Mu, PA-C.    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  Tobacco Use   Smoking status: Every Day    Packs/day: 0.25    Years: 50.00    Total pack years: 12.50    Types: Cigarettes    Start date: 12/10/1973   Smokeless tobacco: Never   Tobacco comments:    1-2 ciggs per day - 04/13/2021; has a hx of 2 PPD x 10 years   Vaping Use   Vaping Use: Never used  Substance and Sexual Activity   Alcohol use: Yes     Alcohol/week: 0.0 standard drinks of alcohol    Comment: occasionally   Drug use: No   Sexual activity: Not Currently    Partners: Male  Other Topics Concern   Not on file  Social History Narrative   Lives in a senior citizen complex and goes out with her friends.   Her daughter died due to heart issues on 10/22/18 at the age of 39.   Both sons live in town and are helping her out    Social Determinants of Health   Financial Resource Strain: Low Risk  (07/13/2021)   Overall Financial Resource Strain (CARDIA)    Difficulty of Paying Living Expenses: Not hard at all  Food Insecurity: No Food Insecurity (07/13/2021)   Hunger Vital Sign    Worried About Running Out of Food in the Last Year: Never true    Ran Out of  Food in the Last Year: Never true  Transportation Needs: Unmet Transportation Needs (07/13/2021)   PRAPARE - Hydrologist (Medical): Yes    Lack of Transportation (Non-Medical): No  Physical Activity: Insufficiently Active (07/13/2021)   Exercise Vital Sign    Days of Exercise per Week: 4 days    Minutes of Exercise per Session: 20 min  Stress: No Stress Concern Present (07/13/2021)   [place]    Feeling of Stress : Not at all  Social Connections: Moderately Integrated (07/13/2021)   Social Connection and Isolation Panel [NHANES]    Frequency of Communication with Friends and Family: More than three times a week    Frequency of Social Gatherings with Friends and Family: More than three times a week    Attends Religious Services: More than 4 times per year    Active Member of [employer] or Organizations: Yes    Attends Archivist Meetings: More than 4 times per year    Marital Status: Widowed     Family History:  The patient's family history includes Diabetes in her father; Heart disease in her daughter and father; Hypertension in her mother and son; Pulmonary embolism in her  daughter; Stroke in her mother. There is no history of Breast cancer.  ROS:   ROS   EKGs/Labs/Other Studies Reviewed:    Studies reviewed were summarized above. The additional studies were reviewed today:  2D echo 10/13/2021: 1. Left ventricular ejection fraction, by estimation, is 60 to 65%. Left  ventricular ejection fraction by 2D MOD biplane is 67.0 %. The left  ventricle has normal function. The left ventricle has no regional wall  motion abnormalities. Left ventricular  diastolic parameters are consistent with Grade I diastolic dysfunction  (impaired relaxation). The average left ventricular global longitudinal  strain is -17.5 %.   2. Right ventricular systolic function is normal. The right ventricular  size is normal.   3. The mitral valve is normal in structure. Mild mitral valve  regurgitation.   4. The aortic valve was not well visualized. Aortic valve regurgitation  is trivial.   5. The inferior vena cava is dilated in size with >50% respiratory  variability, suggesting right atrial pressure of 8 mmHg.   Comparison(s): LVEF 55-60%, Mild-Mod MR. __________  2D echo 04/13/2021: 1. Left ventricular ejection fraction, by estimation, is 55 to 60%. The  left ventricle has normal function. The left ventricle has no regional  wall motion abnormalities. Left ventricular diastolic parameters are  consistent with Grade I diastolic  dysfunction (impaired relaxation).   2. Right ventricular systolic function is normal. The right ventricular  size is normal.   3. The mitral valve is normal in structure. Mild to moderate mitral valve  regurgitation. No evidence of mitral stenosis.   4. The aortic valve is normal in structure. Aortic valve regurgitation is  trivial. No aortic stenosis is present.   5. The inferior vena cava is normal in size with greater than 50%  respiratory variability, suggesting right atrial pressure of 3 mmHg.   Comparison(s): LVEF 55-60%. __________    LHC 02/22/2021:   Prox RCA to Mid RCA lesion is 60% stenosed.   Ost LAD to Prox LAD lesion is 25% stenosed.   Ost Cx to Prox Cx lesion is 40% stenosed.   1st Mrg lesion is 30% stenosed.   Prox LAD lesion is 99% stenosed.   A drug-eluting stent was successfully placed  using a STENT ONYX FRONTIER 3.0X18.   Post intervention, there is a 0% residual stenosis.   There is moderate to severe left ventricular systolic dysfunction.   LV end diastolic pressure is severely elevated.   The left ventricular ejection fraction is 25-35% by visual estimate.   1.  Severe one-vessel coronary artery disease with 99% thrombotic stenosis in the proximal LAD which seems to be the culprit for non-ST elevation myocardial infarction.  In addition, there is mild to moderate left circumflex and RCA disease. 2.  Moderately to severely reduced LV systolic function with an EF of 25 to 30% with severe anterior/apical hypokinesis. 3.  Severely elevated left ventricular end-diastolic pressure at 40 mmHg. 4.  Successful balloon angioplasty, aspiration thrombectomy and drug-eluting stent placement to the proximal LAD.  The initial plan was to use intravascular lithotripsy given calcifications but the patient developed chest pain with sluggish flow in the LAD just before the PCI was started.   Recommendations: Dual antiplatelet therapy for at least 12 months. Aggressive treatment of risk factors. The patient was given 1 dose of IV furosemide 20 mg during the case with good urine output.  Monitor respiratory status and consider additional diuresis as needed. Treat the rest of the coronary artery disease medically. __________   2D echo 02/22/2021: 1. Left ventricular ejection fraction, by estimation, is 55 to 60%. The  left ventricle has normal function. Left ventricular endocardial border  not optimally defined to evaluate regional wall motion. Left ventricular  diastolic parameters are consistent  with Grade I diastolic  dysfunction (impaired relaxation).   2. Right ventricular systolic function is normal. The right ventricular  size is normal. Tricuspid regurgitation signal is inadequate for assessing  PA pressure.   3. The mitral valve is normal in structure. No evidence of mitral valve  regurgitation. No evidence of mitral stenosis.   4. The aortic valve is normal in structure. Aortic valve regurgitation is  not visualized. Mild to moderate aortic valve sclerosis/calcification is  present, without any evidence of aortic stenosis.   5. The inferior vena cava is normal in size with greater than 50%  respiratory variability, suggesting right atrial pressure of 3 mmHg.   EKG:  EKG is ordered today.  The EKG ordered today demonstrates ***  Recent Labs: 02/06/2021: Magnesium 2.3 02/19/2021: ALT 52 10/05/2021: Brain Natriuretic Peptide 83; BUN 14; Creat 0.77; Hemoglobin 12.0; Platelets 335; Potassium 4.4; Sodium 139  Recent Lipid Panel    Component Value Date/Time   CHOL 173 02/21/2020 1453   TRIG 39 02/21/2020 1453   HDL 101 02/21/2020 1453   CHOLHDL 1.7 02/21/2020 1453   LDLCALC 61 02/21/2020 1453   LDLDIRECT 45.5 02/25/2021 0931    PHYSICAL EXAM:    VS:  There were no vitals taken for this visit.  BMI: There is no height or weight on file to calculate BMI.  Physical Exam  Wt Readings from Last 3 Encounters:  10/12/21 131 lb (59.4 kg)  10/05/21 130 lb (59 kg)  07/13/21 129 lb (58.5 kg)     ASSESSMENT & PLAN:   CAD involving the native coronary arteries with***:  HFpEF:  HTN: Blood pressure  HLD: LDL 61.  Chronic dyspnea with COPD and ongoing tobacco use:   {Are you ordering a CV Procedure (e.g. stress test, cath, DCCV, TEE, etc)?   Press F2        :[id number]}     Disposition: F/u with Dr. Fletcher Anon or an APP in ***.   Medication  Adjustments/Labs and Tests Ordered: Current medicines are reviewed at length with the patient today.  Concerns regarding medicines are outlined above.  Medication changes, Labs and Tests ordered today are summarized above and listed in the Patient Instructions accessible in Encounters.   Signed, Christell Faith, PA-C 12/20/2021 10:03 AM     [facility] [address] [place] [address] [place] [place], [place] [address] [phone]

## 2021-12-23 ENCOUNTER — Ambulatory Visit: Payer: Medicare Other | Admitting: Physician Assistant

## 2021-12-28 ENCOUNTER — Other Ambulatory Visit: Payer: Self-pay | Admitting: Family Medicine

## 2021-12-28 DIAGNOSIS — F1190 Opioid use, unspecified, uncomplicated: Secondary | ICD-10-CM

## 2021-12-28 DIAGNOSIS — G8929 Other chronic pain: Secondary | ICD-10-CM

## 2022-01-02 NOTE — Progress Notes (Deleted)
Cardiology Office Note    Date:  01/02/2022   ID:  Meredyth, Hornung 1950/08/04, MRN [id number]  PCP:  Waylan Rocher, MD  Cardiologist:  Kathlyn Sacramento, MD  Electrophysiologist:  None   Chief Complaint: Follow up  History of Present Illness:   Terri Burns is a 71 y.o. female with history of CAD with NSTEMI in 02/2021 status post PCI/DES to the proximal LAD, HFpEF, HTN, HLD, and COPD with ongoing tobacco use who presents for follow up of echo.   She was admitted to the hospital in 02/2021 with an NSTEMI.  Echo showed an EF of 55 to 52%, grade 1 diastolic dysfunction, normal RV systolic function and ventricular cavity size, mild to moderate aortic valve sclerosis without evidence of stenosis.  LHC on 02/22/2021 showed severe one-vessel CAD with 99% thrombotic stenosis of the proximal LAD which was the culprit lesion for her NSTEMI.  In addition, there was mild to moderate LCx and RCA disease as outlined below.  Moderately to severely reduced LV systolic function with an EF of 25 to 30% with severe anterior/apical hypokinesis on LV gram was noted.  Severely elevated LVEDP estimated at 40 mmHg.  She underwent successful balloon angioplasty, aspiration thrombectomy, and drug-eluting stent placement to the proximal LAD.  The initial plan was to use intravascular lithotripsy given calcifications, however the patient developed chest pain with sluggish flow in the LAD just before PCI was started.  She was subsequently diuresed and medications were optimized.  Repeat echo in 04/2021, given inconsistency noted with prior echo and LV gram, showed an EF of 55 to 60%, no regional wall motion abnormalities, grade 1 diastolic dysfunction, normal RV systolic function and ventricular cavity size, mild to moderate mitral regurgitation, trivial aortic insufficiency, and an estimated right atrial pressure of 3 mmHg.  She was seen in 04/2021 with noted exertional dyspnea with BNP of 156 at that time.  She was  last seen in the office in 06/2021 and was without symptoms of angina or decompensation.   More recent, she was evaluated by her PCP on 10/05/2021 noting dyspnea following vulvar pruritus that has persisted and noted to be with exertion.  During the initial episode, she also felt flushed and sweaty.  Note indicates EMS was called with EKG noted to be "normal" (unavailable for review) and had a glucose of 106, up to 160 following orange juice.  She also reported orthopnea and had been sleeping in a recliner.  Labs obtained through PCPs office at that time included a normal BNP of 83, normal high-sensitivity troponin of 3, elevated total CK 162 with a CK-MB of 5.7.  She was treated with prednisone.  Repeat labs obtained on 10/08/2021 again showed a normal high-sensitivity troponin of 3 with an uptrending CK of 214 and a CK-MB of 8.8.  Given these findings, PCP recommended the patient proceed to the ED.  However, patient reported she was feeling better and appointment was scheduled with [place].  She was last seen in the office on 10/12/2021 and reported she developed sudden onset of dyspnea without chest pain on 09/29/2021.  She also reported an episode of hypoglycemia at that time.  She indicates this subsequently led to a panic attack.  With this, she was evaluated by EMS, though was not transported to the ED.  She subsequently followed up with her PCP with blood work as outlined above.  Since the episode, she had been back to baseline with chronic stable dyspnea.  Symptoms  did not feel like her prior NSTEMI.  It was recommended she pursue Lexiscan MPI, though this was canceled.  Echo on 10/13/2021 demonstrated an EF of 60 to 65%, no regional wall motion abnormalities, grade 1 diastolic dysfunction, normal RV systolic function and ventricular cavity size, mild mitral regurgitation, trivial aortic insufficiency, and an estimated right atrial pressure of 8 mmHg.  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    Hyperlipidemia    Hypertension    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    Past Surgical History:  Procedure Laterality Date   CORONARY STENT INTERVENTION N/A 02/22/2021   Procedure: CORONARY STENT INTERVENTION;  Surgeon: Wellington Hampshire, MD;  Location: [facility];  Service: Cardiovascular;  Laterality: N/A;   LEFT HEART CATH AND CORONARY ANGIOGRAPHY N/A 02/22/2021   Procedure: LEFT HEART CATH AND CORONARY ANGIOGRAPHY;  Surgeon: Wellington Hampshire, MD;  Location: [facility];  Service: Cardiovascular;  Laterality: N/A;   NECK SURGERY N/A 10/2003   SHOULDER SURGERY Left 2004    Current Medications: No outpatient medications have been marked as taking for the 01/04/22 encounter (Appointment) with Rise Mu, PA-C.    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  Tobacco Use   Smoking status: Every Day    Packs/day: 0.25    Years: 50.00    Total pack years: 12.50    Types: Cigarettes    Start date: 12/10/1973   Smokeless tobacco: Never   Tobacco comments:    1-2 ciggs per day - 04/13/2021; has a hx of 2 PPD x 10 years   Vaping Use   Vaping Use: Never used  Substance and Sexual Activity   Alcohol use: Yes     Alcohol/week: 0.0 standard drinks of alcohol    Comment: occasionally   Drug use: No   Sexual activity: Not Currently    Partners: Male  Other Topics Concern   Not on file  Social History Narrative   Lives in a senior citizen complex and goes out with her friends.   Her daughter died due to heart issues on 2018-10-26 at the age of 6.   Both sons live in town and are helping her out    Social Determinants of Health   Financial Resource Strain: Low Risk  (07/13/2021)   Overall Financial Resource Strain (CARDIA)    Difficulty of Paying Living Expenses: Not hard at all  Food Insecurity: No Food Insecurity (07/13/2021)   Hunger Vital Sign    Worried About Running Out of Food in the Last Year: Never true    Ran Out  of Food in the Last Year: Never true  Transportation Needs: Unmet Transportation Needs (07/13/2021)   PRAPARE - Hydrologist (Medical): Yes    Lack of Transportation (Non-Medical): No  Physical Activity: Insufficiently Active (07/13/2021)   Exercise Vital Sign    Days of Exercise per Week: 4 days    Minutes of Exercise per Session: 20 min  Stress: No Stress Concern Present (07/13/2021)   [place]    Feeling of Stress : Not at all  Social Connections: Moderately Integrated (07/13/2021)   Social Connection and Isolation Panel [NHANES]    Frequency of Communication with Friends and Family: More than three times a week    Frequency of Social Gatherings with Friends and Family: More than three times a week    Attends Religious Services: More than 4 times per year    Active Member of [employer] or Organizations: Yes    Attends Archivist Meetings: More than 4 times per year    Marital Status: Widowed     Family History:  The patient's family history includes Diabetes in her father; Heart disease in her daughter and father; Hypertension in her mother and son; Pulmonary embolism in her  daughter; Stroke in her mother. There is no history of Breast cancer.  ROS:   ROS   EKGs/Labs/Other Studies Reviewed:    Studies reviewed were summarized above. The additional studies were reviewed today:  2D echo 10/13/2021: 1. Left ventricular ejection fraction, by estimation, is 60 to 65%. Left  ventricular ejection fraction by 2D MOD biplane is 67.0 %. The left  ventricle has normal function. The left ventricle has no regional wall  motion abnormalities. Left ventricular  diastolic parameters are consistent with Grade I diastolic dysfunction  (impaired relaxation). The average left ventricular global longitudinal  strain is -17.5 %.   2. Right ventricular systolic function is normal. The right ventricular  size is normal.   3. The mitral valve is normal in structure. Mild mitral valve  regurgitation.   4. The aortic valve was not well visualized. Aortic valve regurgitation  is trivial.   5. The inferior vena cava is dilated in size with >50% respiratory  variability, suggesting right atrial pressure of 8 mmHg.   Comparison(s): LVEF 55-60%, Mild-Mod MR. __________  2D echo 04/13/2021: 1. Left ventricular ejection fraction, by estimation, is 55 to 60%. The  left ventricle has normal function. The left ventricle has no regional  wall motion abnormalities. Left ventricular diastolic parameters are  consistent with Grade I diastolic  dysfunction (impaired relaxation).   2. Right ventricular systolic function is normal. The right ventricular  size is normal.   3. The mitral valve is normal in structure. Mild to moderate mitral valve  regurgitation. No evidence of mitral stenosis.   4. The aortic valve is normal in structure. Aortic valve regurgitation is  trivial. No aortic stenosis is present.   5. The inferior vena cava is normal in size with greater than 50%  respiratory variability, suggesting right atrial pressure of 3 mmHg.   Comparison(s): LVEF 55-60%. __________    LHC 02/22/2021:   Prox RCA to Mid RCA lesion is 60% stenosed.   Ost LAD to Prox LAD lesion is 25% stenosed.   Ost Cx to Prox Cx lesion is 40% stenosed.   1st Mrg lesion is 30% stenosed.   Prox LAD lesion is 99% stenosed.   A drug-eluting stent was successfully  placed using a STENT ONYX FRONTIER 3.0X18.   Post intervention, there is a 0% residual stenosis.   There is moderate to severe left ventricular systolic dysfunction.   LV end diastolic pressure is severely elevated.   The left ventricular ejection fraction is 25-35% by visual estimate.   1.  Severe one-vessel coronary artery disease with 99% thrombotic stenosis in the proximal LAD which seems to be the culprit for non-ST elevation myocardial infarction.  In addition, there is mild to moderate left circumflex and RCA disease. 2.  Moderately to severely reduced LV systolic function with an EF of 25 to 30% with severe anterior/apical hypokinesis. 3.  Severely elevated left ventricular end-diastolic pressure at 40 mmHg. 4.  Successful balloon angioplasty, aspiration thrombectomy and drug-eluting stent placement to the proximal LAD.  The initial plan was to use intravascular lithotripsy given calcifications but the patient developed chest pain with sluggish flow in the LAD just before the PCI was started.   Recommendations: Dual antiplatelet therapy for at least 12 months. Aggressive treatment of risk factors. The patient was given 1 dose of IV furosemide 20 mg during the case with good urine output.  Monitor respiratory status and consider additional diuresis as needed. Treat the rest of the coronary artery disease medically. __________   2D echo 02/22/2021: 1. Left ventricular ejection fraction, by estimation, is 55 to 60%. The  left ventricle has normal function. Left ventricular endocardial border  not optimally defined to evaluate regional wall motion. Left ventricular  diastolic parameters are consistent  with Grade I diastolic  dysfunction (impaired relaxation).   2. Right ventricular systolic function is normal. The right ventricular  size is normal. Tricuspid regurgitation signal is inadequate for assessing  PA pressure.   3. The mitral valve is normal in structure. No evidence of mitral valve  regurgitation. No evidence of mitral stenosis.   4. The aortic valve is normal in structure. Aortic valve regurgitation is  not visualized. Mild to moderate aortic valve sclerosis/calcification is  present, without any evidence of aortic stenosis.   5. The inferior vena cava is normal in size with greater than 50%  respiratory variability, suggesting right atrial pressure of 3 mmHg.   EKG:  EKG is ordered today.  The EKG ordered today demonstrates ***  Recent Labs: 02/06/2021: Magnesium 2.3 02/19/2021: ALT 52 10/05/2021: Brain Natriuretic Peptide 83; BUN 14; Creat 0.77; Hemoglobin 12.0; Platelets 335; Potassium 4.4; Sodium 139  Recent Lipid Panel    Component Value Date/Time   CHOL 173 02/21/2020 1453   TRIG 39 02/21/2020 1453   HDL 101 02/21/2020 1453   CHOLHDL 1.7 02/21/2020 1453   LDLCALC 61 02/21/2020 1453   LDLDIRECT 45.5 02/25/2021 0931    PHYSICAL EXAM:    VS:  There were no vitals taken for this visit.  BMI: There is no height or weight on file to calculate BMI.  Physical Exam  Wt Readings from Last 3 Encounters:  10/12/21 131 lb (59.4 kg)  10/05/21 130 lb (59 kg)  07/13/21 129 lb (58.5 kg)     ASSESSMENT & PLAN:   CAD involving the native coronary arteries with***:  HFpEF:  HTN: Blood pressure  HLD: LDL 61.  Chronic dyspnea with COPD and ongoing tobacco use:   {Are you ordering a CV Procedure (e.g. stress test, cath, DCCV, TEE, etc)?   Press F2        :[id number]}     Disposition: F/u with Dr. Fletcher Anon or an APP in ***.  Medication Adjustments/Labs and Tests Ordered: Current medicines are reviewed at length with the patient today.  Concerns regarding medicines are outlined above.  Medication changes, Labs and Tests ordered today are summarized above and listed in the Patient Instructions accessible in Encounters.   Signed, Christell Faith, PA-C 01/02/2022 10:51 AM     [facility] [place] [address] [place] [place], [place] [address] [phone]

## 2022-01-04 ENCOUNTER — Ambulatory Visit: Payer: Medicare Other | Admitting: Physician Assistant

## 2022-01-04 ENCOUNTER — Encounter: Payer: Self-pay | Admitting: Physician Assistant
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
## 2022-01-05 ENCOUNTER — Telehealth: Payer: Medicare Other

## 2022-01-05 ENCOUNTER — Emergency Department: Payer: Medicare Other

## 2022-01-05 ENCOUNTER — Inpatient Hospital Stay
Admission: EM | Admit: 2022-01-05 | Discharge: 2022-01-07 | DRG: 190 | Disposition: A | Discharge status: Home Health | Payer: Medicare Other | Attending: Internal Medicine | Admitting: Internal Medicine

## 2022-01-05 ENCOUNTER — Other Ambulatory Visit: Payer: Self-pay

## 2022-01-05 ENCOUNTER — Ambulatory Visit: Payer: Medicare Other | Admitting: Family Medicine

## 2022-01-05 DIAGNOSIS — E785 Hyperlipidemia, unspecified: Secondary | ICD-10-CM | POA: Diagnosis present

## 2022-01-05 DIAGNOSIS — Z85828 Personal history of other malignant neoplasm of skin: Secondary | ICD-10-CM

## 2022-01-05 DIAGNOSIS — F17210 Nicotine dependence, cigarettes, uncomplicated: Secondary | ICD-10-CM | POA: Diagnosis present

## 2022-01-05 DIAGNOSIS — J9602 Acute respiratory failure with hypercapnia: Secondary | ICD-10-CM | POA: Diagnosis present

## 2022-01-05 DIAGNOSIS — K219 Gastro-esophageal reflux disease without esophagitis: Secondary | ICD-10-CM | POA: Diagnosis present

## 2022-01-05 DIAGNOSIS — I10 Essential (primary) hypertension: Secondary | ICD-10-CM | POA: Diagnosis present

## 2022-01-05 DIAGNOSIS — J45901 Unspecified asthma with (acute) exacerbation: Secondary | ICD-10-CM | POA: Diagnosis present

## 2022-01-05 DIAGNOSIS — Z881 Allergy status to other antibiotic agents status: Secondary | ICD-10-CM | POA: Diagnosis not present

## 2022-01-05 DIAGNOSIS — Z79899 Other long term (current) drug therapy: Secondary | ICD-10-CM

## 2022-01-05 DIAGNOSIS — Z20822 Contact with and (suspected) exposure to covid-19: Secondary | ICD-10-CM | POA: Diagnosis present

## 2022-01-05 DIAGNOSIS — J4541 Moderate persistent asthma with (acute) exacerbation: Secondary | ICD-10-CM

## 2022-01-05 DIAGNOSIS — Z886 Allergy status to analgesic agent status: Secondary | ICD-10-CM

## 2022-01-05 DIAGNOSIS — J441 Chronic obstructive pulmonary disease with (acute) exacerbation: Secondary | ICD-10-CM | POA: Diagnosis present

## 2022-01-05 DIAGNOSIS — Z955 Presence of coronary angioplasty implant and graft: Secondary | ICD-10-CM | POA: Diagnosis not present

## 2022-01-05 DIAGNOSIS — I2510 Atherosclerotic heart disease of native coronary artery without angina pectoris: Secondary | ICD-10-CM | POA: Diagnosis present

## 2022-01-05 DIAGNOSIS — J9601 Acute respiratory failure with hypoxia: Secondary | ICD-10-CM | POA: Diagnosis present

## 2022-01-05 DIAGNOSIS — R61 Generalized hyperhidrosis: Secondary | ICD-10-CM | POA: Diagnosis present

## 2022-01-05 DIAGNOSIS — Z8249 Family history of ischemic heart disease and other diseases of the circulatory system: Secondary | ICD-10-CM

## 2022-01-05 DIAGNOSIS — Z720 Tobacco use: Secondary | ICD-10-CM | POA: Diagnosis not present

## 2022-01-05 DIAGNOSIS — F419 Anxiety disorder, unspecified: Secondary | ICD-10-CM | POA: Diagnosis present

## 2022-01-05 DIAGNOSIS — J4489 Other specified chronic obstructive pulmonary disease: Secondary | ICD-10-CM | POA: Diagnosis present

## 2022-01-05 DIAGNOSIS — Z880 Allergy status to penicillin: Secondary | ICD-10-CM | POA: Diagnosis not present

## 2022-01-05 DIAGNOSIS — F32A Depression, unspecified: Secondary | ICD-10-CM | POA: Diagnosis not present

## 2022-01-05 DIAGNOSIS — Z7982 Long term (current) use of aspirin: Secondary | ICD-10-CM | POA: Diagnosis not present

## 2022-01-05 DIAGNOSIS — Z13820 Encounter for screening for osteoporosis: Secondary | ICD-10-CM

## 2022-01-05 LAB — COMPREHENSIVE METABOLIC PANEL
ALT: 25 U/L (ref 0–44)
AST: 41 U/L (ref 15–41)
Albumin: 3.8 g/dL (ref 3.5–5.0)
Alkaline Phosphatase: 68 U/L (ref 38–126)
Anion gap: 7 (ref 5–15)
BUN: 14 mg/dL (ref 8–23)
CO2: 27 mmol/L (ref 22–32)
Calcium: 8.6 mg/dL — ABNORMAL LOW (ref 8.9–10.3)
Chloride: 108 mmol/L (ref 98–111)
Creatinine, Ser: 0.76 mg/dL (ref 0.44–1.00)
GFR, Estimated: >60 mL/min (ref >60)
Glucose, Bld: 149 mg/dL — ABNORMAL HIGH (ref 70–99)
Potassium: 3.7 mmol/L (ref 3.5–5.1)
Sodium: 142 mmol/L (ref 135–145)
Total Bilirubin: 0.5 mg/dL (ref 0.3–1.2)
Total Protein: 7.5 g/dL (ref 6.5–8.1)

## 2022-01-05 LAB — CBC
HCT: 33.1 % — ABNORMAL LOW (ref 36.0–46.0)
Hemoglobin: 10.1 g/dL — ABNORMAL LOW (ref 12.0–15.0)
MCH: 25.7 pg — ABNORMAL LOW (ref 26.0–34.0)
MCHC: 30.5 g/dL (ref 30.0–36.0)
MCV: 84.2 fL (ref 80.0–100.0)
Platelets: 262 10*3/uL (ref 150–400)
RBC: 3.93 MIL/uL (ref 3.87–5.11)
RDW: 16.1 % — ABNORMAL HIGH (ref 11.5–15.5)
WBC: 9.8 10*3/uL (ref 4.0–10.5)
nRBC: 0 % (ref 0.0–0.2)

## 2022-01-05 LAB — CBC WITH DIFFERENTIAL/PLATELET
Abs Immature Granulocytes: 0.05 10*3/uL (ref 0.00–0.07)
Basophils Absolute: 0.1 10*3/uL (ref 0.0–0.1)
Basophils Relative: 1 %
Eosinophils Absolute: 0.3 10*3/uL (ref 0.0–0.5)
Eosinophils Relative: 2 %
HCT: 36.7 % (ref 36.0–46.0)
Hemoglobin: 11.2 g/dL — ABNORMAL LOW (ref 12.0–15.0)
Immature Granulocytes: 1 %
Lymphocytes Relative: 23 %
Lymphs Abs: 2.4 10*3/uL (ref 0.7–4.0)
MCH: 25.6 pg — ABNORMAL LOW (ref 26.0–34.0)
MCHC: 30.5 g/dL (ref 30.0–36.0)
MCV: 84 fL (ref 80.0–100.0)
Monocytes Absolute: 0.9 10*3/uL (ref 0.1–1.0)
Monocytes Relative: 9 %
Neutro Abs: 6.8 10*3/uL (ref 1.7–7.7)
Neutrophils Relative %: 64 %
Platelets: 279 10*3/uL (ref 150–400)
RBC: 4.37 MIL/uL (ref 3.87–5.11)
RDW: 16.1 % — ABNORMAL HIGH (ref 11.5–15.5)
WBC: 10.5 10*3/uL (ref 4.0–10.5)
nRBC: 0 % (ref 0.0–0.2)

## 2022-01-05 LAB — BLOOD GAS, VENOUS
Acid-base deficit: 2.8 mmol/L — ABNORMAL HIGH (ref 0.0–2.0)
Bicarbonate: 26.2 mmol/L (ref 20.0–28.0)
O2 Saturation: 86.1 %
Patient temperature: 37
pCO2, Ven: 64 mmHg — ABNORMAL HIGH (ref 44–60)
pH, Ven: 7.22 — ABNORMAL LOW (ref 7.25–7.43)
pO2, Ven: 61 mmHg — ABNORMAL HIGH (ref 32–45)

## 2022-01-05 LAB — HIV ANTIBODY (ROUTINE TESTING W REFLEX): HIV Screen 4th Generation wRfx: NONREACTIVE

## 2022-01-05 LAB — BASIC METABOLIC PANEL
Anion gap: 7 (ref 5–15)
BUN: 14 mg/dL (ref 8–23)
CO2: 24 mmol/L (ref 22–32)
Calcium: 8.4 mg/dL — ABNORMAL LOW (ref 8.9–10.3)
Chloride: 109 mmol/L (ref 98–111)
Creatinine, Ser: 0.73 mg/dL (ref 0.44–1.00)
GFR, Estimated: >60 mL/min (ref >60)
Glucose, Bld: 172 mg/dL — ABNORMAL HIGH (ref 70–99)
Potassium: 3.8 mmol/L (ref 3.5–5.1)
Sodium: 140 mmol/L (ref 135–145)

## 2022-01-05 LAB — TROPONIN I (HIGH SENSITIVITY)
Troponin I (High Sensitivity): 7 ng/L (ref <18)
Troponin I (High Sensitivity): 11 ng/L (ref <18)

## 2022-01-05 LAB — BRAIN NATRIURETIC PEPTIDE: B Natriuretic Peptide: 299.3 pg/mL — ABNORMAL HIGH (ref 0.0–100.0)

## 2022-01-05 LAB — SARS CORONAVIRUS 2 BY RT PCR: SARS Coronavirus 2 by RT PCR: NEGATIVE

## 2022-01-05 MED ORDER — TRAZODONE HCL 50 MG PO TABS: 25.0000 mg | ORAL_TABLET | Freq: Every evening | ORAL | Status: DC | PRN

## 2022-01-05 MED ORDER — SODIUM CHLORIDE 0.9 % IV SOLN
1.0000 g | INTRAVENOUS | Status: DC
  Administered 2022-01-05 – 2022-01-07 (×3): 1 g via INTRAVENOUS
  Filled 2022-01-05 (×3): qty 10

## 2022-01-05 MED ORDER — ONDANSETRON HCL 4 MG PO TABS: 4.0000 mg | ORAL_TABLET | Freq: Four times a day (QID) | ORAL | Status: DC | PRN

## 2022-01-05 MED ORDER — IPRATROPIUM-ALBUTEROL 0.5-2.5 (3) MG/3ML IN SOLN
3.0000 mL | Freq: Four times a day (QID) | RESPIRATORY_TRACT | Status: DC
  Administered 2022-01-05: 3 mL via RESPIRATORY_TRACT
  Filled 2022-01-05: qty 3

## 2022-01-05 MED ORDER — TRAMADOL HCL 50 MG PO TABS
50.0000 mg | ORAL_TABLET | Freq: Three times a day (TID) | ORAL | Status: DC
  Administered 2022-01-05 – 2022-01-07 (×7): 50 mg via ORAL
  Filled 2022-01-05 (×7): qty 1

## 2022-01-05 MED ORDER — TICAGRELOR 90 MG PO TABS
90.0000 mg | ORAL_TABLET | Freq: Two times a day (BID) | ORAL | Status: DC
  Administered 2022-01-05 – 2022-01-07 (×5): 90 mg via ORAL
  Filled 2022-01-05 (×5): qty 1

## 2022-01-05 MED ORDER — PREDNISONE 20 MG PO TABS
40.0000 mg | ORAL_TABLET | Freq: Every day | ORAL | Status: DC
  Administered 2022-01-06: 40 mg via ORAL
  Filled 2022-01-05: qty 2

## 2022-01-05 MED ORDER — SODIUM CHLORIDE 0.9 % IV SOLN: INTRAVENOUS | Status: DC

## 2022-01-05 MED ORDER — LOSARTAN POTASSIUM 50 MG PO TABS
100.0000 mg | ORAL_TABLET | Freq: Every evening | ORAL | Status: DC
  Administered 2022-01-05 – 2022-01-06 (×2): 100 mg via ORAL
  Filled 2022-01-05 (×2): qty 2

## 2022-01-05 MED ORDER — IPRATROPIUM-ALBUTEROL 0.5-2.5 (3) MG/3ML IN SOLN
3.0000 mL | Freq: Four times a day (QID) | RESPIRATORY_TRACT | Status: DC
  Administered 2022-01-05 – 2022-01-07 (×7): 3 mL via RESPIRATORY_TRACT
  Filled 2022-01-05 (×7): qty 3

## 2022-01-05 MED ORDER — ALPRAZOLAM 0.25 MG PO TABS
0.2500 mg | ORAL_TABLET | Freq: Two times a day (BID) | ORAL | Status: DC | PRN
  Administered 2022-01-05: 0.25 mg via ORAL
  Filled 2022-01-05: qty 1

## 2022-01-05 MED ORDER — PANTOPRAZOLE SODIUM 40 MG PO TBEC
40.0000 mg | DELAYED_RELEASE_TABLET | Freq: Every evening | ORAL | Status: DC
  Administered 2022-01-05 – 2022-01-06 (×2): 40 mg via ORAL
  Filled 2022-01-05 (×2): qty 1

## 2022-01-05 MED ORDER — CITALOPRAM HYDROBROMIDE 20 MG PO TABS
10.0000 mg | ORAL_TABLET | Freq: Every day | ORAL | Status: DC
  Administered 2022-01-05 – 2022-01-07 (×3): 10 mg via ORAL
  Filled 2022-01-05 (×3): qty 1

## 2022-01-05 MED ORDER — ISOSORBIDE MONONITRATE ER 30 MG PO TB24
15.0000 mg | ORAL_TABLET | Freq: Every day | ORAL | Status: DC
  Administered 2022-01-05 – 2022-01-07 (×3): 15 mg via ORAL
  Filled 2022-01-05 (×3): qty 1

## 2022-01-05 MED ORDER — ACETAMINOPHEN 650 MG RE SUPP: 650.0000 mg | Freq: Four times a day (QID) | RECTAL | Status: DC | PRN

## 2022-01-05 MED ORDER — IPRATROPIUM-ALBUTEROL 0.5-2.5 (3) MG/3ML IN SOLN
3.0000 mL | Freq: Once | RESPIRATORY_TRACT | Status: AC
  Administered 2022-01-05: 3 mL via RESPIRATORY_TRACT
  Filled 2022-01-05: qty 3

## 2022-01-05 MED ORDER — NALOXONE HCL 4 MG/0.1ML NA LIQD: 1.0000 | NASAL | Status: DC | PRN

## 2022-01-05 MED ORDER — AMLODIPINE BESYLATE 5 MG PO TABS
5.0000 mg | ORAL_TABLET | Freq: Every morning | ORAL | Status: DC
  Administered 2022-01-05 – 2022-01-07 (×3): 5 mg via ORAL
  Filled 2022-01-05 (×3): qty 1

## 2022-01-05 MED ORDER — METHYLPREDNISOLONE SODIUM SUCC 40 MG IJ SOLR
40.0000 mg | Freq: Two times a day (BID) | INTRAMUSCULAR | Status: AC
  Administered 2022-01-05 (×2): 40 mg via INTRAVENOUS
  Filled 2022-01-05 (×2): qty 1

## 2022-01-05 MED ORDER — LORATADINE 10 MG PO TABS
10.0000 mg | ORAL_TABLET | Freq: Every day | ORAL | Status: DC
  Administered 2022-01-05 – 2022-01-07 (×3): 10 mg via ORAL
  Filled 2022-01-05 (×3): qty 1

## 2022-01-05 MED ORDER — ACETAMINOPHEN 325 MG PO TABS
650.0000 mg | ORAL_TABLET | Freq: Four times a day (QID) | ORAL | Status: DC | PRN
  Administered 2022-01-05: 650 mg via ORAL
  Filled 2022-01-05: qty 2

## 2022-01-05 MED ORDER — MAGNESIUM HYDROXIDE 400 MG/5ML PO SUSP
30.0000 mL | Freq: Every day | ORAL | Status: DC | PRN
  Administered 2022-01-06: 30 mL via ORAL
  Filled 2022-01-05: qty 30

## 2022-01-05 MED ORDER — ASPIRIN 81 MG PO TBEC
81.0000 mg | DELAYED_RELEASE_TABLET | Freq: Every day | ORAL | Status: DC
  Administered 2022-01-05 – 2022-01-07 (×3): 81 mg via ORAL
  Filled 2022-01-05 (×3): qty 1

## 2022-01-05 MED ORDER — ONDANSETRON HCL 4 MG/2ML IJ SOLN: 4.0000 mg | Freq: Four times a day (QID) | INTRAMUSCULAR | Status: DC | PRN

## 2022-01-05 MED ORDER — ATORVASTATIN CALCIUM 20 MG PO TABS
80.0000 mg | ORAL_TABLET | Freq: Every day | ORAL | Status: DC
  Administered 2022-01-05 – 2022-01-07 (×3): 80 mg via ORAL
  Filled 2022-01-05 (×3): qty 4

## 2022-01-05 MED ORDER — MIDAZOLAM HCL 2 MG/2ML IJ SOLN
2.0000 mg | Freq: Once | INTRAMUSCULAR | Status: AC
Start: 2022-01-05 — End: 2022-01-05
  Administered 2022-01-05: 2 mg via INTRAVENOUS
  Filled 2022-01-05: qty 2

## 2022-01-05 MED ORDER — METOPROLOL SUCCINATE ER 25 MG PO TB24
25.0000 mg | ORAL_TABLET | Freq: Every evening | ORAL | Status: DC
  Administered 2022-01-05 – 2022-01-06 (×2): 25 mg via ORAL
  Filled 2022-01-05 (×2): qty 1

## 2022-01-05 MED ORDER — ENOXAPARIN SODIUM 40 MG/0.4ML IJ SOSY
40.0000 mg | PREFILLED_SYRINGE | INTRAMUSCULAR | Status: DC
Start: 2022-01-05 — End: 2022-01-07
  Administered 2022-01-05 – 2022-01-07 (×3): 40 mg via SUBCUTANEOUS
  Filled 2022-01-05 (×3): qty 0.4

## 2022-01-05 MED ORDER — FLUTICASONE PROPIONATE 50 MCG/ACT NA SUSP
2.0000 | Freq: Every day | NASAL | Status: DC | PRN
Start: 2022-01-05 — End: 2022-01-07

## 2022-01-05 MED ORDER — MIDAZOLAM HCL 2 MG/2ML IJ SOLN
1.0000 mg | Freq: Once | INTRAMUSCULAR | Status: AC
  Administered 2022-01-05: 1 mg via INTRAVENOUS
  Filled 2022-01-05: qty 2

## 2022-01-05 NOTE — ED Notes (Signed)
Patient was placed on 4L nasal canula; continued labor breathing and expiratory wheezes.

## 2022-01-05 NOTE — ED Triage Notes (Signed)
Pt felt shob earlier today and was able to do a breathing treatment and felt better. Pt went to sleep and was woke from sleep unable to breathe. When EMS arrived pt was 73% room air. EMS gave albuterol and duoneb and 125 solumedrol and placed pt on non rebreather. Pt 100% on non rebreather upon arrival.

## 2022-01-05 NOTE — Progress Notes (Signed)
Patient received from ED via stretcher.  Patient is alert and oriented x 4.  Able to get up and walk to the bed with one-person assist.  2L [place] in place, assisted in position of comfort.  Oriented to room and unit routine.  Call bell within reach, needs addressed.

## 2022-01-05 NOTE — ED Notes (Signed)
Patient is now relaxed and resting, sitting high fowler's, 100% O2 on 4 L [place]. Will continue to monitor.

## 2022-01-05 NOTE — H&P (Addendum)
[place]   PATIENT NAME: Terri Burns    MR#:  [id number]  DATE OF BIRTH:  1951-06-02  DATE OF ADMISSION:  01/05/2022  PRIMARY CARE PHYSICIAN: Entzminger, Mendel Corning, MD   Patient is coming from: Home  REQUESTING/REFERRING PHYSICIAN: Hinda Kehr, MD  CHIEF COMPLAINT:   Chief Complaint  Patient presents with   Shortness of Breath    HISTORY OF PRESENT ILLNESS:  Terri Burns is a 71 y.o. female with medical history significant for COPD, asthma, depression, hypertension, dyslipidemia, CAD status post PCI and stent, and GERD who presented to the emergency room with acute onset of worsening dyspnea that started yesterday.  She took her nebulizer therapy and went to sleep then unfortunately woke up unable to breathe.  Upon EMS arrival her pulse oximetry was 73% on room air.  She was gasping for air and fairly diaphoretic.  She was given 1 DuoNeb and 1 albuterol treatment in addition to IV Solu-Medrol 125 mg per EMS.  She was placed on 100% nonrebreather.  Upon arrival to the ER she was still in significant respiratory distress speaking few words at a time and therefore was placed on BiPAP.  She admitted to expectorant cough over the last few days with her dyspnea as well as associated wheezing.  No reported fever or chills.  No reported nausea or vomiting or abdominal pain.  No chest pain or palpitations.  No dysuria, oliguria or hematuria or flank pain.  She usually sleeps on a recliner and denied having any worsening orthopnea.  She denied any worsening dyspnea on exertion before yesterday.  No worsening lower extremity edema.  ED Course: When she came to the ER, BP was 165/121 with heart rate of 103 and respirate rate of 35 with pulse oximetry of 100% on nonrebreather.  VBG showed pH 7.22 with HCO3 of 26.2.  CMP showed a blood glucose of 149 with a calcium of 8.6.  BNP was 299.3 and CBC showed hemoglobin of 11.1 hematocrit 36.7.  EKG as reviewed by me : EKG showed sinus  tachycardia with a rate of 102 with inferior Q waves. Imaging: Portable chest x-ray showed no acute cardiopulmonary disease.  The patient was given 2 DuoNebs and a total of 30 mg of IV Versed as well as 125 mg IV Solu-Medrol by EMS in addition to DuoNeb and albuterol.  He was initially placed on BiPAP and due to significant persistent distress she was taken off BiPAP and was more comfortable band.  She will be admitted to an observation medical telemetry bed for further evaluation and management. 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    Hyperlipidemia    Hypertension    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    PAST SURGICAL HISTORY:   Past Surgical History:  Procedure Laterality Date   CORONARY STENT INTERVENTION N/A 02/22/2021   Procedure: CORONARY STENT INTERVENTION;  Surgeon: Wellington Hampshire, MD;  Location: [facility];  Service: Cardiovascular;  Laterality: N/A;   LEFT HEART CATH AND CORONARY ANGIOGRAPHY N/A 02/22/2021   Procedure: LEFT HEART CATH AND CORONARY ANGIOGRAPHY;  Surgeon: Wellington Hampshire, MD;  Location: [facility];  Service: Cardiovascular;  Laterality: N/A;  NECK SURGERY N/A 10/2003   SHOULDER SURGERY Left 2004    SOCIAL HISTORY:   Social History   Tobacco Use   Smoking status: Every Day    Packs/day: 0.25    Years: 50.00    Total pack years: 12.50    Types: Cigarettes    Start date: 12/10/1973   Smokeless tobacco: Never   Tobacco comments:    1-2 ciggs per day - 04/13/2021; has a hx of 2 PPD x 10 years   Substance Use Topics   Alcohol use: Yes    Alcohol/week: 0.0 standard drinks of alcohol    Comment: occasionally    FAMILY HISTORY:   Family History  Problem Relation Age of Onset    Hypertension Mother    Stroke Mother    Heart disease Father    Diabetes Father    Heart disease Daughter    Pulmonary embolism Daughter    Hypertension Son    Breast cancer Neg Hx     DRUG ALLERGIES:   Allergies  Allergen Reactions   Levofloxacin Shortness Of Breath   Penicillins Hives    itching   Meloxicam     dizziness   Nsaids Other (See Comments)    Patient states she can tolerate up to three doses per day without incident    REVIEW OF SYSTEMS:   ROS As per history of present illness. All pertinent systems were reviewed above. Constitutional, HEENT, cardiovascular, respiratory, GI, GU, musculoskeletal, neuro, psychiatric, endocrine, integumentary and hematologic systems were reviewed and are otherwise negative/unremarkable except for positive findings mentioned above in the HPI.   MEDICATIONS AT HOME:   Prior to Admission medications   Medication Sig Start Date End Date Taking? Authorizing Provider  amLODipine (NORVASC) 5 MG tablet Take 5 mg by mouth every morning. 11/18/21  Yes [provider]  aspirin 81 MG EC tablet TAKE 1 TABLET BY MOUTH EVERY DAY 04/22/19  Yes Sowles, Drue Stager, MD  atorvastatin (LIPITOR) 40 MG tablet Take 2 tablets (80 mg total) by mouth daily. 09/07/21  Yes Sowles, Drue Stager, MD  BRILINTA 90 MG TABS tablet TAKE ONE TABLET BY MOUTH EVERY MORNING and TAKE ONE TABLET BY MOUTH EVERYDAY AT BEDTIME 12/01/21  Yes Wellington Hampshire, MD  citalopram (CELEXA) 20 MG tablet TAKE 1 AND 1/2 TABLETS BY MOUTH EVERY EVENING 12/08/21  Yes Sowles, Drue Stager, MD  Fluticasone-Umeclidin-Vilant (TRELEGY ELLIPTA) 100-62.5-25 MCG/ACT AEPB Inhale 1 puff into the lungs daily. 10/05/21  Yes Sowles, Drue Stager, MD  isosorbide mononitrate (IMDUR) 30 MG 24 hr tablet TAKE 1/2 TABLET BY MOUTH ONCE DAILY 12/06/21  Yes Sowles, Drue Stager, MD  loratadine (ALLERGY RELIEF) 10 MG tablet Take 1 tablet (10 mg total) by mouth daily. 09/07/21  Yes Sowles, Drue Stager, MD  losartan (COZAAR) 100 MG tablet  Take 1 tablet (100 mg total) by mouth every evening. 10/05/21  Yes Sowles, Drue Stager, MD  metoprolol succinate (TOPROL-XL) 25 MG 24 hr tablet Take 1 tablet (25 mg total) by mouth every evening. 10/05/21  Yes Sowles, Drue Stager, MD  pantoprazole (PROTONIX) 40 MG tablet Take 1 tablet (40 mg total) by mouth every evening. 10/05/21  Yes Sowles, Drue Stager, MD  traMADol (ULTRAM) 50 MG tablet Take 1 tablet (50 mg total) by mouth 3 (three) times daily. 10/05/21  Yes Sowles, Drue Stager, MD  albuterol (PROVENTIL) (2.5 MG/3ML) 0.083% nebulizer solution Take 3 mLs (2.5 mg total) by nebulization every 4 (four) hours as needed for wheezing or shortness of breath. 04/13/21   Tanda Rockers, MD  ALPRAZolam (  XANAX) 0.25 MG tablet Take 0.25 mg by mouth 2 (two) times daily as needed. 11/18/21   [provider]  fluticasone (FLONASE) 50 MCG/ACT nasal spray PLACE TWO SPRAYS into BOTH nostrils DAILY 12/03/21   Steele Sizer, MD  [facility] 4 MG/0.1ML LIQD nasal spray kit 1 spray as needed. As needed in case of overdose 04/13/21   [provider]  VENTOLIN HFA 108 (90 Base) MCG/ACT inhaler INHALE 1-2 PUFFS BY MOUTH into THE lungs EVERY 6 HOURS AS NEEDED FOR FOR WHEEZING AND/OR SHORTNESS OF BREATH 10/05/21   Steele Sizer, MD      VITAL SIGNS:  Blood pressure 137/78, pulse 86, resp. rate 13, height _0  (1.473 m), weight 59.4 kg, SpO2 100 %.  PHYSICAL EXAMINATION:  Physical Exam  GENERAL:  71 y.o.-year-old Caucasian female patient lying in the bed with mild distress with conversational dyspnea. EYES: Pupils equal, round, reactive to light and accommodation. No scleral icterus. Extraocular muscles intact.  HEENT: Head atraumatic, normocephalic. Oropharynx and nasopharynx clear.  NECK:  Supple, no jugular venous distention. No thyroid enlargement, no tenderness.  LUNGS: Diffuse expiratory wheezes with tight expiratory airflow and harsh vesicular breathing.   CARDIOVASCULAR: Regular rate and rhythm, S1, S2 normal. No  murmurs, rubs, or gallops.  ABDOMEN: Soft, nondistended, nontender. Bowel sounds present. No organomegaly or mass.  EXTREMITIES: No pedal edema, cyanosis, or clubbing.  NEUROLOGIC: Cranial nerves II through XII are intact. Muscle strength 5/5 in all extremities. Sensation intact. Gait not checked.  PSYCHIATRIC: The patient is alert and oriented x 3.  Normal affect and good eye contact. SKIN: No obvious rash, lesion, or ulcer.   LABORATORY PANEL:   CBC Recent Labs  Lab 01/05/22 0057  WBC 10.5  HGB 11.2*  HCT 36.7  PLT 279   ------------------------------------------------------------------------------------------------------------------  Chemistries  Recent Labs  Lab 01/05/22 0057  NA 142  K 3.7  CL 108  CO2 27  GLUCOSE 149*  BUN 14  CREATININE 0.76  CALCIUM 8.6*  AST 41  ALT 25  ALKPHOS 68  BILITOT 0.5   ------------------------------------------------------------------------------------------------------------------  Cardiac Enzymes No results for input(s): "TROPONINI" in the last 168 hours. ------------------------------------------------------------------------------------------------------------------  RADIOLOGY:  DG Chest Portable 1 View  Result Date: 01/05/2022 CLINICAL DATA:  Acute shortness of breath EXAM: PORTABLE CHEST 1 VIEW COMPARISON:  02/19/2021 FINDINGS: Cardiac shadow is stable. Lungs are well aerated bilaterally. No focal infiltrate or effusion is seen. No bony abnormality is noted. Postsurgical changes in the cervical spine are again seen. IMPRESSION: No active disease. Electronically Signed   By: Inez Catalina M.D.   On: 01/05/2022 01:12      IMPRESSION AND PLAN:  Assessment and Plan: * Asthma exacerbation -He has associated COPD exacerbation. - The patient will be admitted to a medical telemetry observation bed. - We will continue steroid therapy with IV Solu-Medrol. - We will continue bronchodilator therapy with DuoNebs 4 times daily and  every 4 hours as needed. - We will add IV antibiotic therapy given severity of COPD exacerbation with IV Rocephin. - Mucolytic therapy will be provided. - We will hold off long-acting beta agonist.  Acute respiratory failure with hypoxia and hypercarbia (HCC) - This is secondary to her COPD/asthma exacerbation. - She was initially placed on BiPAP. - She is currently comfortable on nasal cannula. - Management otherwise as above  Tobacco abuse She was counseled for smoking cessation and will receive further counseling here.  Anxiety and depression - We will continue her Xanax and Lexapro.  GERD  without esophagitis - We will continue PPI therapy.  Coronary artery disease - We will continue Aspirin and Brilinta, statin therapy, as well as Imdur and Toprol-XL.  Dyslipidemia - We will continue statin therapy.   DVT prophylaxis: Lovenox.  Advanced Care Planning:  Code Status: full code.  Family Communication:  The plan of care was discussed in details with the patient (and family). I answered all questions. The patient agreed to proceed with the above mentioned plan. Further management will depend upon hospital course. Disposition Plan: Back to previous home environment Consults called: none.  All the records are reviewed and case discussed with ED provider.  Status is: Observation   I certify that at the time of admission, it is my clinical judgment that the patient will require  hospital care extending less than 2 midnights.                            Dispo: The patient is from: Home              Anticipated d/c is to: Home              Patient currently is not medically stable to d/c.              Difficult to place patient: No  Christel Mormon M.D on 01/05/2022 at 4:31 AM  [facility]   From 7 PM-7 AM, contact night-coverage [email or web address]  CC: Primary care physician; Entzminger, Mendel Corning, MD

## 2022-01-05 NOTE — Plan of Care (Signed)

## 2022-01-05 NOTE — Assessment & Plan Note (Signed)
-   We will continue her Xanax and Lexapro.

## 2022-01-05 NOTE — Assessment & Plan Note (Addendum)
-   We will continue Aspirin and Brilinta, statin therapy, as well as Imdur and Toprol-XL.

## 2022-01-05 NOTE — ED Notes (Signed)
Pt assisted to sit in chair at side of bed so this nurse could change sheets on the bed. Bed was wet. Purewick placed on patient when back in bed.

## 2022-01-05 NOTE — Assessment & Plan Note (Signed)
She was counseled for smoking cessation and will receive further counseling here. 

## 2022-01-05 NOTE — Assessment & Plan Note (Signed)
-   We will continue PPI therapy 

## 2022-01-05 NOTE — ED Provider Notes (Addendum)
[facility] Provider Note    Event Date/Time   First MD Initiated Contact with Patient 01/05/22 [id number]     (approximate)   History   Shortness of Breath   HPI Level 5 caveat:  history/ROS limited by acute/critical illness  Terri Burns is a 71 y.o. female whose medical history includes COPD but who is not on supplemental oxygen.  She presents by EMS for evaluation of acute shortness of breath.  Reportedly she felt short of breath earlier today and felt better after breathing treatment.  However she went to sleep and then awoke from sleep unable to breathe.  EMS reports that when they arrived, the patient was 73% on room air and gasping for breath and very diaphoretic.  EMS provided Solu-Medrol 125 mg IV, 1 albuterol treatment, and 1 DuoNeb.  She was placed on supplemental oxygen and her oxygenation was at 100% upon arrival but the patient was still in distress although she says she felt a little bit better than she did earlier.  She continues to be in moderate to severe respiratory distress upon arrival, speaking only a few words at a time, and stating that she feels like she cannot breathe.  She says she has been "coughing up stuff" for several days but the severe shortness of breath really only started tonight.     Physical Exam   Triage Vital Signs: ED Triage Vitals  Enc Vitals Group     BP 01/05/22 0048 (!) 165/121     Pulse Rate 01/05/22 0048 (!) 103     Resp 01/05/22 0048 (!) 35     Temp --      Temp Source 01/05/22 0048 Oral     SpO2 01/05/22 0042 98 %     Weight 01/05/22 0050 59.4 kg (130 lb 15.3 oz)     Height 01/05/22 0050 1.473 m ('4\' 10"'$ )     Head Circumference --      Peak Flow --      Pain Score 01/05/22 0052 8     Pain Loc --      Pain Edu? --      Excl. in [place]? --     Most recent vital signs: Vitals:   01/05/22 0300 01/05/22 0330  BP: 125/82 137/78  Pulse: 92 86  Resp: 15 13  SpO2: 100% 100%     General: Awake, moderate to  severe respiratory distress, appears chronically ill at baseline with superimposed acute respiratory illness. CV:  Good peripheral perfusion.  Mild tachycardia.  Normal heart sounds. Resp:  Increased respiratory effort with accessory muscle usage and intercostal retractions.  Some coarse breath sounds in the bases, no significant wheezing but diminished breath sounds throughout. Abd:  No distention.  No tenderness to palpation. Other:  Patient is anxious which is appropriate under the circumstances.  No focal neurological deficits are appreciated.   ED Results / Procedures / Treatments   Labs (all labs ordered are listed, but only abnormal results are displayed) Labs Reviewed  BLOOD GAS, VENOUS - Abnormal; Notable for the following components:      Result Value   pH, Ven 7.22 (*)    pCO2, Ven 64 (*)    pO2, Ven 61 (*)    Acid-base deficit 2.8 (*)    All other components within normal limits  BRAIN NATRIURETIC PEPTIDE - Abnormal; Notable for the following components:   B Natriuretic Peptide 299.3 (*)    All other components within normal limits  CBC WITH DIFFERENTIAL/PLATELET - Abnormal; Notable for the following components:   Hemoglobin 11.2 (*)    MCH 25.6 (*)    RDW 16.1 (*)    All other components within normal limits  COMPREHENSIVE METABOLIC PANEL - Abnormal; Notable for the following components:   Glucose, Bld 149 (*)    Calcium 8.6 (*)    All other components within normal limits  SARS CORONAVIRUS 2 BY RT PCR  HIV ANTIBODY (ROUTINE TESTING W REFLEX)  BASIC METABOLIC PANEL  CBC  TROPONIN I (HIGH SENSITIVITY)  TROPONIN I (HIGH SENSITIVITY)     EKG  ED ECG REPORT I, Hinda Kehr, the attending physician, personally viewed and interpreted this ECG.  Date: 01/05/2022 EKG Time: 00: 48 Rate: 102 Rhythm: Sinus tachycardia QRS Axis: normal Intervals: normal ST/T Wave abnormalities: Non-specific ST segment / T-wave changes, but no clear evidence of acute  ischemia. Narrative Interpretation: no definitive evidence of acute ischemia; does not meet STEMI criteria.    RADIOLOGY See hospital course for details: No acute findings on chest x-ray.    PROCEDURES:  Critical Care performed: Yes, see critical care procedure note(s)  .1-3 Lead EKG Interpretation  Performed by: Hinda Kehr, MD Authorized by: Hinda Kehr, MD     Interpretation: abnormal     ECG rate:  105   ECG rate assessment: tachycardic     Rhythm: sinus tachycardia     Ectopy: none     Conduction: normal   .Critical Care  Performed by: Hinda Kehr, MD Authorized by: Hinda Kehr, MD   Critical care provider statement:    Critical care time (minutes):  45   Critical care time was exclusive of:  Separately billable procedures and treating other patients   Critical care was necessary to treat or prevent imminent or life-threatening deterioration of the following conditions:  Respiratory failure   Critical care was time spent personally by me on the following activities:  Development of treatment plan with patient or surrogate, evaluation of patient's response to treatment, examination of patient, obtaining history from patient or surrogate, ordering and performing treatments and interventions, ordering and review of laboratory studies, ordering and review of radiographic studies, pulse oximetry, re-evaluation of patient's condition and review of old charts    MEDICATIONS ORDERED IN ED: Medications  aspirin EC tablet 81 mg (has no administration in time range)  traMADol (ULTRAM) tablet 50 mg (has no administration in time range)  amLODipine (NORVASC) tablet 5 mg (has no administration in time range)  atorvastatin (LIPITOR) tablet 80 mg (has no administration in time range)  isosorbide mononitrate (IMDUR) 24 hr tablet 15 mg (has no administration in time range)  losartan (COZAAR) tablet 100 mg (has no administration in time range)  metoprolol succinate (TOPROL-XL)  24 hr tablet 25 mg (has no administration in time range)  ALPRAZolam (XANAX) tablet 0.25 mg (has no administration in time range)  citalopram (CELEXA) tablet 10 mg (has no administration in time range)  pantoprazole (PROTONIX) EC tablet 40 mg (has no administration in time range)  ticagrelor (BRILINTA) tablet 90 mg (has no administration in time range)  naloxone (NARCAN) nasal spray 4 mg/0.1 mL (has no administration in time range)  fluticasone (FLONASE) 50 MCG/ACT nasal spray 2 spray (has no administration in time range)  loratadine (CLARITIN) tablet 10 mg (has no administration in time range)  methylPREDNISolone sodium succinate (SOLU-MEDROL) 40 mg/mL injection 40 mg (has no administration in time range)    Followed by  predniSONE (DELTASONE) tablet 40 mg (  has no administration in time range)  enoxaparin (LOVENOX) injection 40 mg (has no administration in time range)  0.9 %  sodium chloride infusion (has no administration in time range)  acetaminophen (TYLENOL) tablet 650 mg (has no administration in time range)    Or  acetaminophen (TYLENOL) suppository 650 mg (has no administration in time range)  traZODone (DESYREL) tablet 25 mg (has no administration in time range)  magnesium hydroxide (MILK OF MAGNESIA) suspension 30 mL (has no administration in time range)  ondansetron (ZOFRAN) tablet 4 mg (has no administration in time range)    Or  ondansetron (ZOFRAN) injection 4 mg (has no administration in time range)  ipratropium-albuterol (DUONEB) 0.5-2.5 (3) MG/3ML nebulizer solution 3 mL (has no administration in time range)  cefTRIAXone (ROCEPHIN) 1 g in sodium chloride 0.9 % 100 mL IVPB (has no administration in time range)  ipratropium-albuterol (DUONEB) 0.5-2.5 (3) MG/3ML nebulizer solution 3 mL (3 mLs Nebulization Given 01/05/22 0114)  ipratropium-albuterol (DUONEB) 0.5-2.5 (3) MG/3ML nebulizer solution 3 mL (3 mLs Nebulization Given 01/05/22 0114)  midazolam (VERSED) injection 1 mg (1 mg  Intravenous Given 01/05/22 0114)  midazolam (VERSED) injection 2 mg (2 mg Intravenous Given 01/05/22 0139)     IMPRESSION / MDM / ASSESSMENT AND PLAN / ED COURSE  I reviewed the triage vital signs and the nursing notes.                              Differential diagnosis includes, but is not limited to, COPD exacerbation, CHF, ACS, pneumothorax, pneumonia, PE, anxiety, ACS.  Patient's presentation is most consistent with acute presentation with potential threat to life or bodily function.  The patient is on the cardiac monitor to evaluate for evidence of arrhythmia and/or significant heart rate changes.  Labs/studies ordered include 1 view chest x-ray, EKG, VBG, BNP, high-sensitivity troponin, CBC with differential, CMP.    Treatments ordered include BiPAP and DuoNebs x2.  Patient is also very anxious and having trouble with the BiPAP, ordered Versed 1 mg IV.  Patient might require intubation.  We will reassess how she does on BiPAP and lab results are back.   Clinical Course as of 01/05/22 0352  Wed Jan 05, 2022  0118 pCO2, Ven(!): 64 VBG is notable for elevated PCO2 but not severely so and she is already on BiPAP. [CF]  1856 DG Chest Portable 1 View I viewed and interpreted the patient's 1 view chest x-ray.  I see no evidence of focal consolidation or pneumothorax.  Radiology report confirms no acute findings. [CF]  0140 Patient continues to be very agitated.  She is still in respiratory distress.  I reassessed her and she continues to have decreased air movement.  The BiPAP I believe is good for her but she is having trouble tolerating.  We talked about intubation and she confirmed that she does not have a DNR order in place.  We will trial Versed 2 mg IV this time to see if it helps her relax and tolerate the BiPAP, but she understands that we may need to intubate for airway protection and to assess her breathing until her current acute episode improves or resolves. [CF]  0145 CBC with  Differential(!) Reviewed, CBC is within normal limits [CF]  0204 B Natriuretic Peptide(!): 299.3 Slightly elevated BNP but not substantially so.  No pulmonary edema on chest x-ray, unlikely to be CHF exacerbation. [CF]  0204 Comprehensive metabolic panel(!) CMP is within  normal limits. [CF]  0204 Patient continues to be very anxious even after a total of 3 mg of Versed IV.  I asked the nurse to call respiratory and assess whether she thinks the patient can be off of BiPAP.  It may be that she is just so anxious on the BiPAP it is actually making things worse at this point.  I will have a low threshold, however, to intubate the patient depending on how she does off of the BiPAP. [CF]  [id number] Patient is now asleep, breathing more easily, satting 98 to 100% on 4 L of oxygen by nasal cannula.  I am consulting the hospitalist for admission. [CF]  0962 Consulted by secure chat text with Dr. Sidney Ace with the hospitalist service.  He will admit. [CF]  0349 SARS Coronavirus 2 by RT PCR: NEGATIVE [CF]    Clinical Course User Index [CF] Hinda Kehr, MD     FINAL CLINICAL IMPRESSION(S) / ED DIAGNOSES   Final diagnoses:  Acute respiratory failure with hypoxia and hypercapnia (HCC)  COPD exacerbation ([place])  Anxiety     Rx / DC Orders   ED Discharge Orders     None        Note:  This document was prepared using Dragon voice recognition software and may include unintentional dictation errors.   Hinda Kehr, MD 01/05/22 8366    Hinda Kehr, MD 01/05/22 [phone]

## 2022-01-05 NOTE — ED Notes (Signed)
Patient is highly anxious even after receiving at total of 3 mg of Versed; pt yelling "she can't breathe" and continues to attempt to remove her BiPap mask.This RN attempted to comfort patient to decrease her amxiety. Notified Dr. Karma Greaser. Per Dr. Karma Greaser, will place patient on standby on BiPap and will closely monitor.

## 2022-01-05 NOTE — Assessment & Plan Note (Addendum)
-  He has associated COPD exacerbation. - The patient will be admitted to a medical telemetry observation bed. - We will continue steroid therapy with IV Solu-Medrol. - We will continue bronchodilator therapy with DuoNebs 4 times daily and every 4 hours as needed. - We will add IV antibiotic therapy given severity of COPD exacerbation with IV Rocephin. - Mucolytic therapy will be provided. - We will hold off long-acting beta agonist.

## 2022-01-05 NOTE — Assessment & Plan Note (Signed)
-   We will continue statin therapy. 

## 2022-01-05 NOTE — Progress Notes (Signed)
    SHORT PROGRESS NOTE   Subjective:  This is a 71 year old female with COPD, asthma, hypertension, CAD status post PCI and stent, GERD, depression who presents to the hospital for shortness of breath.  It did not improve with nebulizer treatment. When EMS picked her up pulse ox was 73% on room air.  She was given IV Solu-Medrol, and placed on a nonrebreather.  In the ED she was placed on BiPAP and given multiple nebulizer treatments  Assessment and Plan: Principal Problem:   Asthma/COPD exacerbation   Acute respiratory failure with hypoxia and hypercarbia (HCC) -Continue Solu-Medrol and nebulizer treatments Active Problems:    Tobacco abuse -Advised to discontinue    Anxiety and depression -Continue citalopram and Xanax    GERD without esophagitis -Continue pantoprazole  Coronary artery disease with stent - Continue Brilinta, Lipitor, aspirin  Hypertension  - continue losartan, amlodipine and metoprolol   Debbe Odea, MD          Debbe Odea, MD 01/05/2022 [facility]

## 2022-01-05 NOTE — Assessment & Plan Note (Signed)
-   This is secondary to her COPD/asthma exacerbation. - She was initially placed on BiPAP. - She is currently comfortable on nasal cannula. - Management otherwise as above

## 2022-01-06 DIAGNOSIS — F419 Anxiety disorder, unspecified: Secondary | ICD-10-CM | POA: Diagnosis not present

## 2022-01-06 DIAGNOSIS — J4541 Moderate persistent asthma with (acute) exacerbation: Secondary | ICD-10-CM | POA: Diagnosis not present

## 2022-01-06 DIAGNOSIS — J441 Chronic obstructive pulmonary disease with (acute) exacerbation: Secondary | ICD-10-CM | POA: Diagnosis not present

## 2022-01-06 DIAGNOSIS — J9601 Acute respiratory failure with hypoxia: Secondary | ICD-10-CM | POA: Diagnosis not present

## 2022-01-06 DIAGNOSIS — J9602 Acute respiratory failure with hypercapnia: Secondary | ICD-10-CM

## 2022-01-06 MED ORDER — GUAIFENESIN ER 600 MG PO TB12
600.0000 mg | ORAL_TABLET | Freq: Two times a day (BID) | ORAL | Status: DC
  Administered 2022-01-06 – 2022-01-07 (×3): 600 mg via ORAL
  Filled 2022-01-06 (×3): qty 1

## 2022-01-06 MED ORDER — METHYLPREDNISOLONE SODIUM SUCC 40 MG IJ SOLR
40.0000 mg | Freq: Two times a day (BID) | INTRAMUSCULAR | Status: DC
  Administered 2022-01-06 – 2022-01-07 (×3): 40 mg via INTRAVENOUS
  Filled 2022-01-06 (×3): qty 1

## 2022-01-06 NOTE — Plan of Care (Signed)
  Problem: Clinical Measurements: Goal: Respiratory complications will improve Outcome: Not Progressing   Problem: Coping: Goal: Level of anxiety will decrease Outcome: Not Progressing   Problem: Activity: Goal: Ability to tolerate increased activity will improve Outcome: Not Progressing   Problem: Respiratory: Goal: Ability to maintain a clear airway will improve Outcome: Not Progressing   Problem: Respiratory: Goal: Levels of oxygenation will improve Outcome: Not Progressing   Problem: Respiratory: Goal: Ability to maintain adequate ventilation will improve Outcome: Not Progressing

## 2022-01-06 NOTE — TOC CM/SW Note (Signed)
CSW acknowledges consult that patient lives alone and may have home health needs. Please consult PT and OT to evaluate.  Dayton Scrape, [place]

## 2022-01-06 NOTE — TOC Initial Note (Addendum)
Transition of Care [facility]) - Initial/Assessment Note    Patient Details  Name: Terri Burns MRN: [id number] Date of Birth: 10-Jun-1950  [facility]) CM/SW Contact:    Candie Chroman, LCSW Phone Number: 01/06/2022, 2:23 PM  Clinical Narrative:   CSW called patient in the room, introduced role, and explained that PT recommendations would be discussed. She is agreeable to home health. First preference is [facility] since she has worked with them in the past. Asked representative to review referral. If they are unable to accept, will check with other agencies. No further concerns. CSW encouraged patient to contact CSW as needed. CSW will continue to follow patient for support and facilitate return home once stable.               3:10 pm: [facility] unable to accept referral. Set up with [facility] for PT and OT. Patient is aware and agreeable.  Expected Discharge Plan: [place] Barriers to Discharge: Continued Medical Work up   Patient Goals and CMS Choice        Expected Discharge Plan and Services Expected Discharge Plan: [place] Choice: [place] arrangements for the past 2 months: Apartment                                      Prior Living Arrangements/Services Living arrangements for the past 2 months: Apartment Lives with:: Self Patient language and need for interpreter reviewed:: Yes Do you feel safe going back to the place where you live?: Yes      Need for Family Participation in Patient Care: Yes (Comment) Care giver support system in place?: Yes (comment) Current home services: DME Criminal Activity/Legal Involvement Pertinent to Current Situation/Hospitalization: No - Comment as needed  Activities of Daily Living Home Assistive Devices/Equipment: Environmental consultant (specify type), Shower chair with back ADL Screening (condition at time of admission) Patient's cognitive ability adequate to  safely complete daily activities?: Yes Is the patient deaf or have difficulty hearing?: No Does the patient have difficulty seeing, even when wearing glasses/contacts?: No Does the patient have difficulty concentrating, remembering, or making decisions?: No Patient able to express need for assistance with ADLs?: Yes Does the patient have difficulty dressing or bathing?: No Independently performs ADLs?: Yes (appropriate for developmental age) Does the patient have difficulty walking or climbing stairs?: No Weakness of Legs: Both Weakness of Arms/Hands: None  Permission Sought/Granted Permission sought to share information with : Facility Art therapist granted to share information with : Yes, Verbal Permission Granted     Permission granted to share info w AGENCY: Home Health Agencies        Emotional Assessment Appearance:: Appears stated age Attitude/Demeanor/Rapport: Engaged, Gracious Affect (typically observed): Accepting, Appropriate, Calm, Pleasant Orientation: : Oriented to Self, Oriented to Place, Oriented to  Time, Oriented to Situation Alcohol / Substance Use: Not Applicable Psych Involvement: No (comment)  Admission diagnosis:  Anxiety [F41.9] COPD exacerbation ([place]) [J44.1] Asthma exacerbation [J45.901] Acute respiratory failure with hypoxia and hypercapnia ([place]) [J96.01, J96.02] Acute hypoxemic respiratory failure ([place]) [J96.01] Patient Active Problem List   Diagnosis Date Noted   Asthma exacerbation 01/05/2022   Anxiety and depression 01/05/2022   Dyslipidemia 01/05/2022   Coronary artery disease 01/05/2022   GERD without esophagitis 01/05/2022   Acute hypoxemic respiratory failure ([place]) 01/05/2022   Centrilobular emphysema (  [place]) 07/13/2021   Cigarette smoker 04/13/2021   History of non-ST elevation myocardial infarction (NSTEMI)    Acute respiratory failure with hypoxia ([place]) 09/13/2020   HLD (hyperlipidemia) 09/13/2020   Atherosclerosis  of aorta ([place]) 03/04/2018   Coronary artery calcification of native artery 03/04/2018   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 / group D  05/12/2015   Degeneration of intervertebral disc of cervical region 05/12/2015   GERD (gastroesophageal reflux disease) 05/12/2015   Major depression in remission ([place]) 05/12/2015   PCP:  Waylan Rocher, MD Pharmacy:   [facility] - [place], [place] - [address] [place] [address] Phone: [phone] Fax: [phone]     Social Determinants of Health (SDOH) Interventions    Readmission Risk Interventions     No data to display

## 2022-01-06 NOTE — Evaluation (Signed)
Physical Therapy Evaluation Patient Details Name: Terri Burns MRN: [id number] DOB: 06/06/1951 Today's Date: 01/06/2022  History of Present Illness  Terri Burns is a 71 y.o. female with medical history significant for COPD, asthma, depression, hypertension, dyslipidemia, CAD status post PCI and stent, and GERD who presented to the emergency room with acute onset of worsening dyspnea that started yesterday.   Clinical Impression  Pt admitted with above diagnosis. Pt received upright in bed on RA agreeable to PT.  Reports being independent with mobility, mod-I with 4WW in community. Relies on grand son to assist with household chores and relies on family for transportation. To date, pt mod-I with bed mobility and standing independently without AD. Pt ambulates with minguard very slow and guarded with variable step lengths but no LOB noted. Easily becomes SOB with auditory wheezes limiting gait to ~50'. Pt returning to seated upright in bed with all needs in reach. SPO2 at 92% with HR in mid 80's. Education provided on benefits of HHPT due to decreased gait speed, strength, and tolerance for OOB mobility to assist in pt to baseline function. Pt verbalizes understanding. Pt currently with functional limitations due to the deficits listed below (see PT Problem List). Pt will benefit from skilled PT to increase their independence and safety with mobility to allow discharge to the venue listed below.       Recommendations for follow up therapy are one component of a multi-disciplinary discharge planning process, led by the attending physician.  Recommendations may be updated based on patient status, additional functional criteria and insurance authorization.  Follow Up Recommendations Home health PT      Assistance Recommended at Discharge Intermittent Supervision/Assistance  Patient can return home with the following  Assist for transportation;Assistance with cooking/housework    Equipment  Recommendations None recommended by PT  Recommendations for Other Services       Functional Status Assessment Patient has had a recent decline in their functional status and demonstrates the ability to make significant improvements in function in a reasonable and predictable amount of time.     Precautions / Restrictions Precautions Precautions: Fall Restrictions Weight Bearing Restrictions: No      Mobility  Bed Mobility Overal bed mobility: Modified Independent             General bed mobility comments: HOB elevated Patient Response: Cooperative  Transfers Overall transfer level: Needs assistance Equipment used: None Transfers: Sit to/from Stand Sit to Stand: Supervision                Ambulation/Gait Ambulation/Gait assistance: Min guard Gait Distance (Feet): 55 Feet Assistive device: None Gait Pattern/deviations: Step-to pattern       General Gait Details: some variable step lengths but overall no LOB. Slow gait throughout withincreased WOB and wheezing with minimal distance.  Stairs            Wheelchair Mobility    Modified Rankin (Stroke Patients Only)       Balance Overall balance assessment: Needs assistance Sitting-balance support: Bilateral upper extremity supported, Feet supported Sitting balance-Leahy Scale: Fair       Standing balance-Leahy Scale: Fair Standing balance comment: no AD                             Pertinent Vitals/Pain Pain Assessment Pain Assessment: No/denies pain    Home Living  Additional Comments: Used rollator at baseline for community ambulation    Prior Function                 ADLs Comments: Yolanda Bonine assists with household chores     Hand Dominance        Extremity/Trunk Assessment   Upper Extremity Assessment Upper Extremity Assessment: Overall WFL for tasks assessed    Lower Extremity Assessment Lower Extremity Assessment: Generalized  weakness    Cervical / Trunk Assessment Cervical / Trunk Assessment:  (previous c-spine fracture that is healed)  Communication   Communication: No difficulties  Cognition Arousal/Alertness: Awake/alert Behavior During Therapy: WFL for tasks assessed/performed Overall Cognitive Status: Within Functional Limits for tasks assessed                                          General Comments General comments (skin integrity, edema, etc.): SPO2 on RA at 92%    Exercises Other Exercises Other Exercises: Benefits of HHPT   Assessment/Plan    PT Assessment Patient needs continued PT services  PT Problem List Decreased strength;Decreased range of motion;Decreased knowledge of use of DME;Decreased activity tolerance;Decreased safety awareness;Decreased balance       PT Treatment Interventions DME instruction;Balance training;Gait training;Neuromuscular re-education;Functional mobility training;Patient/family education;Therapeutic activities;Therapeutic exercise    PT Goals (Current goals can be found in the Care Plan section)  Acute Rehab PT Goals Patient Stated Goal: improve breathing PT Goal Formulation: With patient Time For Goal Achievement: 01/20/22 Potential to Achieve Goals: Good    Frequency Min 2X/week     Co-evaluation               AM-PAC PT "6 Clicks" Mobility  Outcome Measure Help needed turning from your back to your side while in a flat bed without using bedrails?: None Help needed moving from lying on your back to sitting on the side of a flat bed without using bedrails?: A Little Help needed moving to and from a bed to a chair (including a wheelchair)?: A Little Help needed standing up from a chair using your arms (e.g., wheelchair or bedside chair)?: None Help needed to walk in hospital room?: A Little Help needed climbing 3-5 steps with a railing? : A Lot 6 Click Score: 19    End of Session   Activity Tolerance: Patient tolerated  treatment well Patient left: in bed;with bed alarm set;with call bell/phone within reach Nurse Communication: Mobility status PT Visit Diagnosis: Unsteadiness on feet (R26.81);Muscle weakness (generalized) (M62.81)    Time: 6283-1517 PT Time Calculation (min) (ACUTE ONLY): 11 min   Charges:   PT Evaluation $PT Eval Low Complexity: [place] M. Fairly IV, PT, DPT Physical Therapist- [facility]  01/06/2022, 12:41 PM

## 2022-01-06 NOTE — Progress Notes (Addendum)
Occupational Therapy Evaluation Patient Details Name: Terri Burns MRN: [id number] DOB: [birth year] Today's Date: 01/06/2022   History of present illness Terri Burns is a 71 y.o. female with medical history significant for COPD, asthma, depression, hypertension, dyslipidemia, CAD status post PCI and stent, and GERD who presented to the emergency room with acute onset of worsening dyspnea that started yesterday.   OT comments  Patient presents to today's OT evaluation with pain, SOB, decreased endurance, and decreased strength impacting safety and independence in ADLs. Patient reports living independently at home. She has a grandson and friends that provide assistance for IADLs (driving, grocery shopping, household management tasks). PTA patient was using a rollator for functional mobility and was Mod I for ADL tasks. Patient currently requiring SBA for functional transfers and Min guard for functional mobility at room level using a RW. Patient able to complete dressing and grooming tasks with set up A. Patient able to complete [facility] transfer and toileting tasks including clothing management and peri care with SBA for safety. Patient will benefit from acute OT while in this hospital to increase overall independence in the areas of ADLs and functional mobility in order to safely discharge home with intermittent assistance from family/friends.    Recommendations for follow up therapy are one component of a multi-disciplinary discharge planning process, led by the attending physician.  Recommendations may be updated based on patient status, additional functional criteria and insurance authorization.    Follow Up Recommendations  No OT follow up    Assistance Recommended at Discharge Intermittent supervision/assistance  Patient can return home with the following  Assist for transportation;Assistance with cooking/housework;A little help with walking and/or transfers   Equipment Recommendations   BSC/3in1    Recommendations for Other Services      Precautions / Restrictions Precautions Precautions: Fall Restrictions Weight Bearing Restrictions: No       Mobility Bed Mobility Overal bed mobility: Modified Independent             General bed mobility comments: HOB elevated, use of bed rails Patient Response: Cooperative  Transfers Overall transfer level: Needs assistance Equipment used: Rolling walker (2 wheels) Transfers: Sit to/from Stand Sit to Stand: Supervision                 Balance Overall balance assessment: Needs assistance Sitting-balance support: Feet supported, Bilateral upper extremity supported Sitting balance-Leahy Scale: Fair     Standing balance support: Bilateral upper extremity supported, During functional activity, Reliant on assistive device for balance Standing balance-Leahy Scale: Fair                             ADL either performed or assessed with clinical judgement   ADL Overall ADL's : Needs assistance/impaired     Grooming: Wash/dry face;Supervision/safety;Standing;Set up Grooming Details (indicate cue type and reason): Pt completed grooming tasks with set up A while standing at sink             Lower Body Dressing: Set up;Sitting/lateral leans Lower Body Dressing Details (indicate cue type and reason): Pt donned socks while sitting EOB with figure 4 positioning             Functional mobility during ADLs: Min guard;Rolling walker (2 wheels)      Extremity/Trunk Assessment Upper Extremity Assessment Upper Extremity Assessment: Generalized weakness   Lower Extremity Assessment Lower Extremity Assessment: Generalized weakness   Cervical / Trunk Assessment Cervical / Trunk Assessment:  (  previous c-spine fracture that is healed)    Vision Baseline Vision/History: 0 No visual deficits Patient Visual Report: No change from baseline Vision Assessment?: No apparent visual deficits   Perception  Perception Perception: Not tested   Praxis Praxis Praxis: Not tested    Cognition Arousal/Alertness: Awake/alert Behavior During Therapy: WFL for tasks assessed/performed Overall Cognitive Status: Within Functional Limits for tasks assessed                                 General Comments: Pt A&Ox4, able to follow all commands        Exercises      Shoulder Instructions       General Comments SpO2 94% on RA    Pertinent Vitals/ Pain       Pain Assessment Pain Assessment: 0-10 Pain Score: 7  Pain Location: abdomen Pain Descriptors / Indicators: Constant, Burning Pain Intervention(s): Limited activity within patient's tolerance, Patient requesting pain meds-RN notified, Monitored during session  Home Living Family/patient expects to be discharged to:: Private residence Living Arrangements: Alone Available Help at Discharge: Family;Friend(s);Available PRN/intermittently (grandson) Type of Home: Apartment Home Access: Level entry     Home Layout: One level     Bathroom Shower/Tub: Teacher, early years/pre: Standard Bathroom Accessibility: Yes   Home Equipment: Rollator (4 wheels);Tub bench;Grab bars - tub/shower;Grab bars - toilet;Hand held shower head   Additional Comments: Used rollator at baseline for community ambulation      Prior Functioning/Environment              Frequency  Min 2X/week        Progress Toward Goals  OT Goals(current goals can now be found in the care plan section)     Acute Rehab OT Goals Patient Stated Goal: return home OT Goal Formulation: With patient Time For Goal Achievement: 01/20/22 Potential to Achieve Goals: Good ADL Goals Pt Will Perform Grooming: Independently Pt Will Perform Lower Body Dressing: with modified independence Pt Will Transfer to Toilet: with modified independence Pt Will Perform Toileting - Clothing Manipulation and hygiene: with modified independence Additional ADL  Goal #1: Pt will verbalize 2-3 Energy conservation techniques with Min VC  Plan      Co-evaluation                 AM-PAC OT "6 Clicks" Daily Activity     Outcome Measure   Help from another person eating meals?: None Help from another person taking care of personal grooming?: None Help from another person toileting, which includes using toliet, bedpan, or urinal?: A Little Help from another person bathing (including washing, rinsing, drying)?: A Little Help from another person to put on and taking off regular upper body clothing?: None Help from another person to put on and taking off regular lower body clothing?: A Little 6 Click Score: 21    End of Session Equipment Utilized During Treatment: Rolling walker (2 wheels)  OT Visit Diagnosis: Pain;Muscle weakness (generalized) (M62.81);Unsteadiness on feet (R26.81) Pain - part of body:  (abdomen)   Activity Tolerance Patient limited by pain;Patient tolerated treatment well   Patient Left in bed;with call bell/phone within reach   Nurse Communication Mobility status;Patient requests pain meds        Time: 9242-6834 OT Time Calculation (min): 22 min  Charges: OT General Charges $OT Visit: 1 Visit OT Evaluation $OT Eval Low Complexity: 1 Low OT Treatments $Self Care/Home Management :  8-22 mins  [facility] MS, OTR/L ascom [phone]  01/06/22, 4:26 PM

## 2022-01-06 NOTE — Progress Notes (Signed)
Ambulated patient approximately 50 feet in the hallway, on room air, saturation 97%.  Patient with increased WOB, started coughing, chest congestion and wheezing audible without auscultation.  Oxygen saturation maintained at 97% on room air throughout activity.  Needs addressed.

## 2022-01-06 NOTE — Care Management Important Message (Signed)
Important Message  Patient Details  Name: Terri Burns MRN: [id number] Date of Birth: 02-19-1951   Medicare Important Message Given:  N/A - LOS <3 / Initial given by admissions     Dannette Barbara 01/06/2022, 3:04 PM

## 2022-01-06 NOTE — Progress Notes (Signed)
[facility] Progress Note  Patient: Terri Burns     FOY:[id number]  DOA: 01/05/2022   PCP: Waylan Rocher, MD       Brief hospital course:  This is a 72 year old female with COPD, asthma, hypertension, CAD status post PCI and stent, GERD, depression who presents to the hospital for shortness of breath.  It did not improve with nebulizer treatment. When EMS picked her up pulse ox was 73% on room air.  She was given IV Solu-Medrol, and placed on a nonrebreather.  In the ED she was placed on BiPAP and given multiple nebulizer treatments  Subjective:  Still feels short of breath and has cough with congestion. Unable to bring up the mucous.   Assessment and Plan: Principal Problem:   Asthma/COPD exacerbation   Acute respiratory failure with hypoxia and hypercarbia (HCC) - she was only able to ambulate 50 ft today and was limited by dyspnea -Continue Solu-Medrol and nebulizer treatments  Active Problems:     Tobacco abuse -Advised to discontinue     Anxiety and depression -Continue citalopram and Xanax     GERD without esophagitis -Continue pantoprazole   Coronary artery disease with stent - Continue Brilinta, Lipitor, aspirin  Hypertension  - continue losartan, amlodipine and metoprolol       DVT prophylaxis:  enoxaparin (LOVENOX) injection 40 mg Start: 01/05/22 0800     Code Status: Full Code  Consultants: none Level of Care: Level of care: Med-Surg Disposition Plan:  Status is: Inpatient Remains inpatient appropriate because dyspnea  Objective:   Vitals:   01/06/22 0842 01/06/22 0941 01/06/22 1113 01/06/22 1119  BP:      Pulse:      Resp:      Temp:      TempSrc:      SpO2: 100% 97% 100% 97%  Weight:      Height:       Filed Weights   01/05/22 0050  Weight: 59.4 kg   Exam: General exam: Appears comfortable  HEENT: PERRLA, oral mucosa moist, no sclera icterus or thrush Respiratory system: rhonchi- Respiratory effort  normal. Cardiovascular system: S1 & S2 heard, regular rate and rhythm Gastrointestinal system: Abdomen soft, non-tender, nondistended. Normal bowel sounds   Central nervous system: Alert and oriented. No focal neurological deficits. Extremities: No cyanosis, clubbing or edema Skin: No rashes or ulcers Psychiatry:  Mood & affect appropriate.    Imaging and lab data was personally reviewed    CBC: Recent Labs  Lab 01/05/22 0057 01/05/22 0452  WBC 10.5 9.8  NEUTROABS 6.8  --   HGB 11.2* 10.1*  HCT 36.7 33.1*  MCV 84.0 84.2  PLT 279 767   Basic Metabolic Panel: Recent Labs  Lab 01/05/22 0057 01/05/22 0452  NA 142 140  K 3.7 3.8  CL 108 109  CO2 27 24  GLUCOSE 149* 172*  BUN 14 14  CREATININE 0.76 0.73  CALCIUM 8.6* 8.4*   GFR: Estimated Creatinine Clearance: 50.6 mL/min (by C-G formula based on SCr of 0.73 mg/dL).  Scheduled Meds:  amLODipine  5 mg Oral q morning   aspirin EC  81 mg Oral Daily   atorvastatin  80 mg Oral Daily   citalopram  10 mg Oral Daily   enoxaparin (LOVENOX) injection  40 mg Subcutaneous Q24H   guaiFENesin  600 mg Oral BID   ipratropium-albuterol  3 mL Nebulization QID   isosorbide mononitrate  15 mg Oral Daily   loratadine  10 mg Oral  Daily   losartan  100 mg Oral QPM   methylPREDNISolone (SOLU-MEDROL) injection  40 mg Intravenous Q12H   metoprolol succinate  25 mg Oral QPM   pantoprazole  40 mg Oral QPM   ticagrelor  90 mg Oral BID   traMADol  50 mg Oral TID   Continuous Infusions:  cefTRIAXone (ROCEPHIN)  IV 1 g (01/06/22 0318)     LOS: 1 day   Author: Debbe Odea  01/06/2022 3:34 PM

## 2022-01-07 DIAGNOSIS — J4541 Moderate persistent asthma with (acute) exacerbation: Secondary | ICD-10-CM | POA: Diagnosis not present

## 2022-01-07 DIAGNOSIS — F419 Anxiety disorder, unspecified: Secondary | ICD-10-CM | POA: Diagnosis not present

## 2022-01-07 DIAGNOSIS — J441 Chronic obstructive pulmonary disease with (acute) exacerbation: Secondary | ICD-10-CM | POA: Diagnosis not present

## 2022-01-07 DIAGNOSIS — J9601 Acute respiratory failure with hypoxia: Secondary | ICD-10-CM | POA: Diagnosis not present

## 2022-01-07 MED ORDER — AZITHROMYCIN 250 MG PO TABS: 250.0000 mg | ORAL_TABLET | Freq: Every day | ORAL | Status: DC

## 2022-01-07 MED ORDER — AZITHROMYCIN 250 MG PO TABS
500.0000 mg | ORAL_TABLET | Freq: Every day | ORAL | Status: AC
  Administered 2022-01-07: 500 mg via ORAL
  Filled 2022-01-07: qty 2

## 2022-01-07 MED ORDER — ALBUTEROL SULFATE (2.5 MG/3ML) 0.083% IN NEBU: 2.5000 mg | INHALATION_SOLUTION | Freq: Four times a day (QID) | RESPIRATORY_TRACT | 12 refills | Status: AC

## 2022-01-07 MED ORDER — GUAIFENESIN ER 600 MG PO TB12: 600.0000 mg | ORAL_TABLET | Freq: Two times a day (BID) | ORAL | 0 refills | Status: AC

## 2022-01-07 MED ORDER — PREDNISONE 10 MG PO TABS: ORAL_TABLET | ORAL | 0 refills | Status: DC

## 2022-01-07 MED ORDER — AMLODIPINE BESYLATE 10 MG PO TABS: 10.0000 mg | ORAL_TABLET | Freq: Every day | ORAL | 11 refills | Status: DC

## 2022-01-07 MED ORDER — AZITHROMYCIN 250 MG PO TABS: 250.0000 mg | ORAL_TABLET | Freq: Every day | ORAL | 0 refills | Status: AC

## 2022-01-07 NOTE — TOC Transition Note (Signed)
Transition of Care [facility]) - CM/SW Discharge Note   Patient Details  Name: ROWYNN MCWEENEY MRN: [id number] Date of Birth: 05/21/1951  Transition of Care [facility]) CM/SW Contact:  Candie Chroman, LCSW Phone Number: 01/07/2022, 12:48 PM   Clinical Narrative:   Patient has orders to discharge home today. [facility] representative is aware and they can add aide and Education officer, museum, per MD request. MD said patient is now agreeable to 3-in-1. Ordered through [employer]. No further concerns. CSW signing off.  Final next level of care: Home w Home Health Services Barriers to Discharge: Barriers Resolved   Patient Goals and CMS Choice     Choice offered to / list presented to : Patient  Discharge Placement                    Patient and family notified of of transfer: 01/07/22  Discharge Plan and Services     Post Acute Care Choice: Home Health          DME Arranged: 3-N-1 DME Agency: [employer] Date DME Agency Contacted: 01/07/22   Representative spoke with at DME Agency: Suanne Marker HH Arranged: PT, OT, Nurse's Aide, Social Work [employer] Agency: [place] Date [facility] Agency Contacted: 01/07/22   Representative spoke with at [place]: [place] Interventions     Readmission Risk Interventions     No data to display

## 2022-01-07 NOTE — Discharge Summary (Signed)
Physician Discharge Summary  Terri Burns HYW:[id number] DOB: 1951/04/22 DOA: 01/05/2022  PCP: Waylan Rocher, MD  Admit date: 01/05/2022 Discharge date: 01/07/2022 Discharging to: home Recommendations for Outpatient Follow-up:  Continue to encourage smoking cessation   Consults:  none Procedures:  none   Discharge Diagnoses:   Principal Problem:   Asthma exacerbation Active Problems:   Tobacco abuse   Anxiety and depression   GERD without esophagitis   Dyslipidemia   Coronary artery disease   Acute hypoxemic respiratory failure [facility])     Hospital Course:  This is a 71 year old female with COPD, asthma, hypertension, CAD status post PCI and stent, GERD, depression who presents to the hospital for shortness of breath.  It did not improve with nebulizer treatment. When EMS picked her up pulse ox was 73% on room air.  She was given IV Solu-Medrol, and placed on a nonrebreather.  In the ED she was placed on BiPAP and given multiple nebulizer treatments  Principal Problem:   Asthma/COPD exacerbation   Acute respiratory failure with hypoxia and hypercarbia (HCC) - has resolved - transition to Prednisone- have asked her to take her Albuterol nebs QID for now at home   Active Problems:     Tobacco abuse -Advised to discontinue     Anxiety and depression -Continue citalopram and Xanax     GERD without esophagitis -Continue pantoprazole   Coronary artery disease with stent - Continue Brilinta, Lipitor, aspirin  Hypertension  - continue losartan, amlodipine and metoprolol           Discharge Instructions  Discharge Instructions     Diet - low sodium heart healthy   Complete by: As directed    Increase activity slowly   Complete by: As directed       Allergies as of 01/07/2022       Reactions   Levofloxacin Shortness Of Breath   Penicillins Hives   itching   Meloxicam    dizziness   Nsaids Other (See Comments)   Patient states she can tolerate  up to three doses per day without incident        Medication List     TAKE these medications    ALPRAZolam 0.25 MG tablet Commonly known as: XANAX Take 0.25 mg by mouth 2 (two) times daily as needed.   amLODipine 10 MG tablet Commonly known as: NORVASC Take 1 tablet (10 mg total) by mouth daily.   aspirin EC 81 MG tablet TAKE 1 TABLET BY MOUTH EVERY DAY   atorvastatin 40 MG tablet Commonly known as: LIPITOR Take 2 tablets (80 mg total) by mouth daily.   azithromycin 250 MG tablet Commonly known as: ZITHROMAX Take 1 tablet (250 mg total) by mouth daily for 4 days. Start taking on: January 10, 2022   Brilinta 90 MG Tabs tablet Generic drug: ticagrelor TAKE ONE TABLET BY MOUTH EVERY MORNING and TAKE ONE TABLET BY MOUTH EVERYDAY AT BEDTIME   citalopram 20 MG tablet Commonly known as: CELEXA TAKE 1 AND 1/2 TABLETS BY MOUTH EVERY EVENING   fluticasone 50 MCG/ACT nasal spray Commonly known as: FLONASE PLACE TWO SPRAYS into BOTH nostrils DAILY   guaiFENesin 600 MG 12 hr tablet Commonly known as: MUCINEX Take 1 tablet (600 mg total) by mouth 2 (two) times daily.   isosorbide mononitrate 30 MG 24 hr tablet Commonly known as: IMDUR TAKE 1/2 TABLET BY MOUTH ONCE DAILY   loratadine 10 MG tablet Commonly known as: Allergy Relief Take 1 tablet (10  mg total) by mouth daily.   losartan 100 MG tablet Commonly known as: COZAAR Take 1 tablet (100 mg total) by mouth every evening.   metoprolol succinate 25 MG 24 hr tablet Commonly known as: TOPROL-XL Take 1 tablet (25 mg total) by mouth every evening.   Narcan 4 MG/0.1ML Liqd nasal spray kit Generic drug: naloxone 1 spray as needed. As needed in case of overdose   pantoprazole 40 MG tablet Commonly known as: PROTONIX Take 1 tablet (40 mg total) by mouth every evening.   predniSONE 10 MG tablet Commonly known as: DELTASONE 60 mg tomorrow, taper by 10 mg daily until complete   traMADol 50 MG tablet Commonly known as:  ULTRAM Take 1 tablet (50 mg total) by mouth 3 (three) times daily.   Trelegy Ellipta 100-62.5-25 MCG/ACT Aepb Generic drug: Fluticasone-Umeclidin-Vilant Inhale 1 puff into the lungs daily.   Ventolin HFA 108 (90 Base) MCG/ACT inhaler Generic drug: albuterol INHALE 1-2 PUFFS BY MOUTH into THE lungs EVERY 6 HOURS AS NEEDED FOR FOR WHEEZING AND/OR SHORTNESS OF BREATH What changed: Another medication with the same name was changed. Make sure you understand how and when to take each.   albuterol (2.5 MG/3ML) 0.083% nebulizer solution Commonly known as: PROVENTIL Take 3 mLs (2.5 mg total) by nebulization 4 (four) times daily. What changed:  when to take this reasons to take this               Durable Medical Equipment  (From admission, onward)           Start     Ordered   01/07/22 1058  For home use only DME 3 n 1  Once        01/07/22 1057            Follow-up Information     [facility] Follow up.   Why: They will follow up with you for your home health therapy needs. Contact information: [place] [address] [phone]                    The results of significant diagnostics from this hospitalization (including imaging, microbiology, ancillary and laboratory) are listed below for reference.    DG Chest Portable 1 View  Result Date: 01/05/2022 CLINICAL DATA:  Acute shortness of breath EXAM: PORTABLE CHEST 1 VIEW COMPARISON:  02/19/2021 FINDINGS: Cardiac shadow is stable. Lungs are well aerated bilaterally. No focal infiltrate or effusion is seen. No bony abnormality is noted. Postsurgical changes in the cervical spine are again seen. IMPRESSION: No active disease. Electronically Signed   By: Inez Catalina M.D.   On: 01/05/2022 01:12   MM 3D SCREEN BREAST BILATERAL  Result Date: 12/14/2021 CLINICAL DATA:  Screening. EXAM: DIGITAL SCREENING BILATERAL MAMMOGRAM WITH TOMOSYNTHESIS AND CAD TECHNIQUE: Bilateral  screening digital craniocaudal and mediolateral oblique mammograms were obtained. Bilateral screening digital breast tomosynthesis was performed. The images were evaluated with computer-aided detection. COMPARISON:  None available. ACR Breast Density Category b: There are scattered areas of fibroglandular density. FINDINGS: There are no findings suspicious for malignancy. IMPRESSION: No mammographic evidence of malignancy. A result letter of this screening mammogram will be mailed directly to the patient. RECOMMENDATION: Screening mammogram in one year. (Code:[id number]-B-01Y) BI-RADS CATEGORY  1: Negative. Electronically Signed   By: Franki Cabot M.D.   On: 12/14/2021 15:27   Labs:   Basic Metabolic Panel: Recent Labs  Lab 01/05/22 0057 01/05/22 0452  NA 142 140  K 3.7 3.8  CL 108 109  CO2 27 24  GLUCOSE 149* 172*  BUN 14 14  CREATININE 0.76 0.73  CALCIUM 8.6* 8.4*     CBC: Recent Labs  Lab 01/05/22 0057 01/05/22 0452  WBC 10.5 9.8  NEUTROABS 6.8  --   HGB 11.2* 10.1*  HCT 36.7 33.1*  MCV 84.0 84.2  PLT 279 262         SIGNED:   Debbe Odea, MD  [facility] 01/07/2022, 11:44 AM

## 2022-01-07 NOTE — Progress Notes (Signed)
3 in 1 arrived to room.

## 2022-01-07 NOTE — Care Management Important Message (Signed)
Important Message  Patient Details  Name: Terri Burns MRN: [id number] Date of Birth: Nov 19, 1950   Medicare Important Message Given:  N/A - LOS <3 / Initial given by admissions     [name] [name] 01/07/2022, 9:18 AM

## 2022-01-07 NOTE — Plan of Care (Signed)
  Problem: Nutrition: Goal: Adequate nutrition will be maintained Outcome: Not Progressing   Problem: Clinical Measurements: Goal: Respiratory complications will improve Outcome: Not Progressing   Problem: Safety: Goal: Ability to remain free from injury will improve Outcome: Not Progressing

## 2022-01-07 NOTE — Evaluation (Signed)
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    Hyperlipidemia    Hypertension    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   Past Surgical History:  Past Surgical History:  Procedure Laterality Date   CORONARY STENT INTERVENTION N/A 02/22/2021   Procedure: CORONARY STENT INTERVENTION;  Surgeon: Wellington Hampshire, MD;  Location: [place] [facility];  Service: Cardiovascular;  Laterality: N/A;   LEFT HEART CATH AND CORONARY ANGIOGRAPHY N/A 02/22/2021   Procedure: LEFT HEART CATH AND CORONARY ANGIOGRAPHY;  Surgeon: Wellington Hampshire, MD;  Location: [place] [facility];  Service: Cardiovascular;  Laterality: N/A;   NECK SURGERY N/A 10/2003   SHOULDER SURGERY Left 2004   HPI:  Pt is a 71 y.o. female with medical history significant for COPD, asthma, depression, hypertension, dyslipidemia, CAD status post PCI and stent, chronic use of opiods, and GERD who presented to the emergency room with acute onset of worsening dyspnea that started yesterday.  She took her nebulizer therapy and went to sleep then unfortunately woke up unable to breathe.  Upon EMS arrival her pulse oximetry was 73% on room air.  She was gasping for air and fairly diaphoretic.  She was given 1 DuoNeb and 1 albuterol treatment in addition to IV Solu-Medrol 125 mg per EMS.  She was placed on 100% nonrebreather.  Upon arrival to the ER she was still in significant respiratory distress speaking few words at a time and therefore was  placed on BiPAP.  She admitted to expectorant cough over the last few days with her dyspnea as well as associated wheezing.  No reported fever or chills.  No reported nausea or vomiting or abdominal pain.  No chest pain or palpitations.  No dysuria, oliguria or hematuria or flank pain.  She usually sleeps on a recliner and denied having any worsening orthopnea.  She denied any worsening dyspnea on exertion before yesterday.  No worsening lower extremity edema.   Pt was admitted w/ dx of Centrilobular Emphysema.  CXR: "No active disease" at admit.    Assessment / Plan / Recommendation  Clinical Impression   Pt seen for BSE this morning. She was pt was awake/alert, prepping her breakfast tray for eating. Supported w/ positioning to open chest and reduce crunching of abdomen. Noted min SOB/WOB w/ exertion including talking, moving in bed. CXR: negative at admit. Afebrile, WBC wnl.  Pt c/o difficulty w/ Pills "hanging in my throat sometimes" -- a globus feeling intermittently but not consistently noticeable w/ foods "at all". 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 She has a Baseline of GERD; has not followed up w/ GI. Pt also endorses she has SOB w/ exertion - discussed the relationship of breathing/swallowing  Pt appears to present w/ adequate oropharyngeal phase swallow function w/ No oropharyngeal phase dysphagia noted, No neuromuscular deficits noted. Pt consumed po trials w/ No overt, clinical s/s of aspiration during po trials. Pt appears at reduced risk for aspiration following general aspiration precautions, and  using a slightly moistened/cut diet for ease of mastication d/t Missing MANY Dentition.   During po trials, pt consumed all consistencies w/ no overt coughing, decline in vocal quality, or change in respiratory presentation during/post trials. O2 sats remained in the mid 90s. Rest Breaks were educated on/given to maintain calm, unlabored breathing. Oral phase appeared [facility]  w/ timely bolus management, mastication/mashing, and control of bolus propulsion for A-P transfer for swallowing w/ the SOFT FOODS consumed at the breakfast meal. Oral clearing achieved w/ all trial consistencies w/ Time -- encouraged pt to alternate foods/moist foods then a sip of liquid and to use Small bites.  OM Exam appeared [facility] w/ no unilateral weakness noted. Speech Clear. Pt fed self.   Recommend continue a fairly Regular consistency diet (mech soft -- w/ well-Cut meats, moistened foods); Thin liquids. Recommend general aspiration and REFLUX precautions, REST BREAKS DURING MEALS to calm breathing. Pills WHOLE vs Crushed in Puree for safer, easier swallowing as pt described Larger pills causing difficulty to swallow -- getting "stuck", pointing to her Cervical Esophageal area.   Education given on Pills in Puree; food consistencies and easy to eat options; general aspiration and REFLUX precautions. Handouts given. NSG to reconsult if any new needs arise. NSG agreed. MD updated.  SLP Visit Diagnosis: Dysphagia, unspecified (R13.10) (missing Dentition; min WOB/SOB w/ exertion)    Aspiration Risk  Mild aspiration risk;Risk for inadequate nutrition/hydration (reduced when following general aspiration precautions)    Diet Recommendation   a fairly Regular consistency diet (mech soft -- w/ well-Cut meats, moistened foods); Thin liquids. Recommend general aspiration precautions and Reflux precautions. REST BREAKS DURING MEALS to calm breathing during exertion of meals.  Medication Administration: Whole meds with puree (vs Crushed for ease of swallowing)    Other  Recommendations Recommended Consults:  (Dietician f/u) Oral Care Recommendations: Oral care BID;Oral care before and after PO;Patient independent with oral care Other Recommendations:  (n/a)    Recommendations for follow up therapy are one component of a multi-disciplinary discharge planning process, led by the attending physician.   Recommendations may be updated based on patient status, additional functional criteria and insurance authorization.  Follow up Recommendations No SLP follow up      Assistance Recommended at Discharge Set up Supervision/Assistance  Functional Status Assessment Patient has had a recent decline in their functional status and demonstrates the ability to make significant improvements in function in a reasonable and predictable amount of time.  Frequency and Duration  (n/a)   (n/a)       Prognosis Prognosis for Safe Diet Advancement: Fair (-Good) Barriers to Reach Goals: Time post onset;Severity of deficits Barriers/Prognosis Comment: Centrilobular Emphysema baseline      Swallow Study   General Date of Onset: 01/05/22 HPI: Pt is a 71 y.o. female with medical history significant for COPD, asthma, depression, hypertension, dyslipidemia, CAD status post PCI and stent, chronic use of opiods, and GERD who presented to the emergency room with acute onset of worsening dyspnea that started yesterday.  She took her nebulizer therapy and went to sleep then unfortunately woke up unable to breathe.  Upon EMS arrival her pulse oximetry was 73% on room air.  She was gasping for air and fairly diaphoretic.  She was given 1 DuoNeb and 1 albuterol treatment in addition to IV Solu-Medrol 125 mg per EMS.  She was placed on 100% nonrebreather.  Upon arrival to the ER she was still in significant respiratory distress speaking few words at a time  and therefore was placed on BiPAP.  She admitted to expectorant cough over the last few days with her dyspnea as well as associated wheezing.  No reported fever or chills.  No reported nausea or vomiting or abdominal pain.  No chest pain or palpitations.  No dysuria, oliguria or hematuria or flank pain.  She usually sleeps on a recliner and denied having any worsening orthopnea.  She denied any worsening dyspnea on exertion before yesterday.  No worsening lower extremity edema.   Pt was admitted w/ dx of Centrilobular Emphysema.  CXR: "No active disease" at admit. Type of Study: Bedside Swallow Evaluation Previous Swallow Assessment: none Diet Prior to this Study: Regular;Thin liquids Temperature Spikes Noted: No (wbc 9.8) Respiratory Status: Room air (has been on [place] O2 support this admit) History of Recent Intubation: No Behavior/Cognition: Alert;Cooperative;Pleasant mood Oral Cavity Assessment: Within Functional Limits Oral Care Completed by SLP: Recent completion by staff Oral Cavity - Dentition: Missing dentition (MANY) Vision: Functional for self-feeding Self-Feeding Abilities: Able to feed self;Needs set up Patient Positioning: Upright in bed (given min support) Baseline Vocal Quality: Normal (min gravely) Volitional Cough: Strong (appeared upper airway congested) Volitional Swallow: Able to elicit    Oral/Motor/Sensory Function Overall Oral Motor/Sensory Function: Within functional limits   Ice Chips Ice chips: Within functional limits Presentation: Spoon (fed; 1 trial)   Thin Liquid Thin Liquid: Within functional limits Presentation: Cup;Self Fed;Straw (10+ trials) Other Comments: water, coffee, milk    Nectar Thick Nectar Thick Liquid: Not tested   Honey Thick Honey Thick Liquid: Not tested   Puree Puree: Within functional limits Presentation: Self Fed;Spoon (8 trials)   Solid     Solid: Within functional limits (grossly -- missing Dentition) Presentation: Self Fed;Spoon (10+ trials) Other Comments: bread(w/ jelly); potatoes; eggs        Orinda Kenner, MS, CCC-SLP Speech Language Pathologist Rehab Services; [place] [phone] (ascom) [place] 01/07/2022,10:31 AM

## 2022-01-07 NOTE — TOC CM/SW Note (Signed)
Patient is not able to walk the distance required to go the bathroom, or he/she is unable to safely negotiate stairs required to access the bathroom.  A 3in1 BSC will alleviate this problem  

## 2022-01-07 NOTE — Plan of Care (Signed)
Patient discharge to home with 3in 1.  Discharge instructions reviewed with patient who verbalized understanding.  PIV removed with tip intact.  Awaiting equiptment arrival and ride. Will continue to monitor.

## 2022-01-07 NOTE — TOC Progression Note (Signed)
Transition of Care [facility]) - Progression Note    Patient Details  Name: Terri Burns MRN: [id number] Date of Birth: [birth year]  [facility] of [facility] CM/SW [place], LCSW Phone Number: 01/07/2022, 10:15 AM  Clinical Narrative:   OT recommending a 3-in-1 but patient declined. Told her that, per MD, she will likely discharge home today. She is unsure who will transport home so encouraged her to go ahead and start calling family/friends.  Expected Discharge Plan: [place] Barriers to Discharge: Continued Medical Work up  Expected Discharge Plan and Services Expected Discharge Plan: [place] Choice: Door arrangements for the past 2 months: Apartment                                       Social Determinants of Health (SDOH) Interventions    Readmission Risk Interventions     No data to display

## 2022-01-07 NOTE — Progress Notes (Signed)
Physical Therapy Treatment Patient Details Name: Terri Burns MRN: [id number] DOB: February 21, 1951 Today's Date: 01/07/2022   History of Present Illness Terri Burns is a 71 y.o. female with medical history significant for COPD, asthma, depression, hypertension, dyslipidemia, CAD status post PCI and stent, and GERD who presented to the emergency room with acute onset of worsening dyspnea that started yesterday.    PT Comments    Pt received upright in bed agreeable to PT. Reports breathing slowly improving. Pt mod-I with bed mobility and standing and walking with supervision without AD. Pt demonstrating step through gait today but remains with slow cadence and cautious throughout relying on 1 bout of ~1-2 min standing rest break with PLB.  Pt tolerating bout of ~120' total returning to upright in bed. Energy conservation techniques discussed with use of rollator in household/community for seat purposed if pt becomes SOB. Education provided on use of flutter valve as well to assist in pulmonary hygiene for audible congestion. Pt remains agreeable to Terri Burns PT as pt remains generally deconditioned from acute stay/illness. D/c recs remain appropriate.    Recommendations for follow up therapy are one component of a multi-disciplinary discharge planning process, led by the attending physician.  Recommendations may be updated based on patient status, additional functional criteria and insurance authorization.  Follow Up Recommendations  Home health PT     Assistance Recommended at Discharge Intermittent Supervision/Assistance  Patient can return home with the following Assist for transportation;Assistance with cooking/housework   Equipment Recommendations  None recommended by PT    Recommendations for Other Services       Precautions / Restrictions Precautions Precautions: Fall Restrictions Weight Bearing Restrictions: No     Mobility  Bed Mobility Overal bed mobility: Modified Independent                Patient Response: Cooperative  Transfers Overall transfer level: Needs assistance Equipment used: None Transfers: Sit to/from Stand Sit to Stand: Supervision                Ambulation/Gait Ambulation/Gait assistance: Supervision Gait Distance (Feet): 122 Feet Assistive device: None Gait Pattern/deviations: Step-through pattern, Decreased step length - left, Decreased step length - right       General Gait Details: more consistent step lengths today with ste through pattern. Remains slowed cadence but no audible wheezing today.   Stairs             Wheelchair Mobility    Modified Rankin (Stroke Patients Only)       Balance Overall balance assessment: Needs assistance Sitting-balance support: Feet supported, Bilateral upper extremity supported Sitting balance-Leahy Scale: Fair     Standing balance support: Bilateral upper extremity supported, During functional activity, Reliant on assistive device for balance Standing balance-Leahy Scale: Fair Standing balance comment: no AD                            Cognition Arousal/Alertness: Awake/alert Behavior During Therapy: WFL for tasks assessed/performed Overall Cognitive Status: Within Functional Limits for tasks assessed                                          Exercises Other Exercises Other Exercises: energy conservation techniques    General Comments General comments (skin integrity, edema, etc.): SPo2 > 90% throughout      Pertinent Vitals/Pain Pain Assessment  Pain Assessment: No/denies pain    Home Living                          Prior Function            PT Goals (current goals can now be found in the care plan section) Acute Rehab PT Goals Patient Stated Goal: improve breathing PT Goal Formulation: With patient Time For Goal Achievement: 01/20/22 Potential to Achieve Goals: Good Progress towards PT goals: Progressing toward  goals    Frequency    Min 2X/week      PT Plan Current plan remains appropriate    Co-evaluation              AM-PAC PT "6 Clicks" Mobility   Outcome Measure  Help needed turning from your back to your side while in a flat bed without using bedrails?: None Help needed moving from lying on your back to sitting on the side of a flat bed without using bedrails?: A Little Help needed moving to and from a bed to a chair (including a wheelchair)?: A Little Help needed standing up from a chair using your arms (e.g., wheelchair or bedside chair)?: None Help needed to walk in Burns room?: A Little Help needed climbing 3-5 steps with a railing? : A Lot 6 Click Score: 19    End of Session Equipment Utilized During Treatment: Gait belt Activity Tolerance: Patient tolerated treatment well Patient left: in bed;with bed alarm set;with call bell/phone within reach Nurse Communication: Mobility status PT Visit Diagnosis: Unsteadiness on feet (R26.81);Muscle weakness (generalized) (M62.81)     Time: 1247-1300 PT Time Calculation (min) (ACUTE ONLY): 13 min  Charges:  $Therapeutic Activity: 8-22 mins                     Lucile Didonato M. Fairly IV, PT, DPT Physical Therapist- [facility]  01/07/2022, 1:38 PM

## 2022-01-10 ENCOUNTER — Telehealth: Payer: Self-pay

## 2022-01-10 NOTE — Progress Notes (Signed)
Chronic Care Management Pharmacy Assistant   Name: Terri Burns  MRN: [id number] DOB: Jun 17, 1950  Reason for Encounter: Medication Review/Medication Coordination Call for [facility]   Recent office visits:  None ID  Recent consult visits:  None ID  Hospital visits:  Medication Reconciliation was completed by comparing discharge summary, patient's EMR and Pharmacy list, and upon discussion with patient.  Admitted to the hospital on 01/05/2022 due to Asthma Exacerbation. Discharge date was 01/07/2022. Discharged from [facility]?Medications Started at [facility] Discharge:?? -START taking: amLODipine (NORVASC) 10 MG tabletTake 1 tablet (10 mg total) by mouth daily. azithromycin (ZITHROMAX) 250 MG tablet Take 1 tablet (250 mg total) by mouth daily for 4 days. Start taking on: January 10, 2022 guaiFENesin (MUCINEX) 600 MG 12 hr tablet Take 1 tablet (600 mg total) by mouth 2 (two) times daily. predniSONE (DELTASONE) 10 MG tablet 60 mg tomorrow, taper by 10 mg daily until complete  Medication Changes at Hospital Discharge: -CHANGE how you take: Ventolin HFA (albuterol) 108 (90 Base) MCG/ACT inhaler INHALE 1-2 PUFFS BY MOUTH into THE lungs EVERY 6 HOURS AS NEEDED FOR FOR WHEEZING AND/OR SHORTNESS OF BREATH albuterol (PROVENTIL) (2.5 MG 3ML) 0.083% nebulizer solution Take 3 mLs (2.5 mg total) by nebulization 4 (four) times daily.  Medications Discontinued at Hospital Discharge: -Stopped None ID  Medications that remain the same after Hospital Discharge:??  -All other medications will remain the same.    Medications: Outpatient Encounter Medications as of 01/10/2022  Medication Sig   albuterol (PROVENTIL) (2.5 MG/3ML) 0.083% nebulizer solution Take 3 mLs (2.5 mg total) by nebulization 4 (four) times daily.   ALPRAZolam (XANAX) 0.25 MG tablet Take 0.25 mg by mouth 2 (two) times daily as needed.   amLODipine (NORVASC) 10 MG tablet Take 1  tablet (10 mg total) by mouth daily.   aspirin 81 MG EC tablet TAKE 1 TABLET BY MOUTH EVERY DAY   atorvastatin (LIPITOR) 40 MG tablet Take 2 tablets (80 mg total) by mouth daily.   azithromycin (ZITHROMAX) 250 MG tablet Take 1 tablet (250 mg total) by mouth daily for 4 days.   BRILINTA 90 MG TABS tablet TAKE ONE TABLET BY MOUTH EVERY MORNING and TAKE ONE TABLET BY MOUTH EVERYDAY AT BEDTIME   citalopram (CELEXA) 20 MG tablet TAKE 1 AND 1/2 TABLETS BY MOUTH EVERY EVENING   fluticasone (FLONASE) 50 MCG/ACT nasal spray PLACE TWO SPRAYS into BOTH nostrils DAILY   Fluticasone-Umeclidin-Vilant (TRELEGY ELLIPTA) 100-62.5-25 MCG/ACT AEPB Inhale 1 puff into the lungs daily.   guaiFENesin (MUCINEX) 600 MG 12 hr tablet Take 1 tablet (600 mg total) by mouth 2 (two) times daily.   isosorbide mononitrate (IMDUR) 30 MG 24 hr tablet TAKE 1/2 TABLET BY MOUTH ONCE DAILY   loratadine (ALLERGY RELIEF) 10 MG tablet Take 1 tablet (10 mg total) by mouth daily.   losartan (COZAAR) 100 MG tablet Take 1 tablet (100 mg total) by mouth every evening.   metoprolol succinate (TOPROL-XL) 25 MG 24 hr tablet Take 1 tablet (25 mg total) by mouth every evening.   NARCAN 4 MG/0.1ML LIQD nasal spray kit 1 spray as needed. As needed in case of overdose   pantoprazole (PROTONIX) 40 MG tablet Take 1 tablet (40 mg total) by mouth every evening.   predniSONE (DELTASONE) 10 MG tablet 60 mg tomorrow, taper by 10 mg daily until complete   traMADol (ULTRAM) 50 MG tablet Take 1 tablet (50 mg total) by mouth 3 (  three) times daily.   VENTOLIN HFA 108 (90 Base) MCG/ACT inhaler INHALE 1-2 PUFFS BY MOUTH into THE lungs EVERY 6 HOURS AS NEEDED FOR FOR WHEEZING AND/OR SHORTNESS OF BREATH   No facility-administered encounter medications on file as of 01/10/2022.   Care Gaps: Zoster Vaccines Dexa Scan Influenza Vaccine  Star Rating Drugs: Atorvastatin 80 mg last filled on 12/08/2021 for a 30-Day supply with [facility] Losartan 100 mg last  filled on 12/08/2021 for a 30-Day supply with [facility] Reviewed chart for medication changes ahead of medication coordination call.  BP Readings from Last 3 Encounters:  01/07/22 (!) 175/124  10/12/21 140/80  10/05/21 118/78    No results found for: "HGBA1C"   Patient obtains medications through Adherence Packaging  30 Days   Last adherence delivery included:  Trelegy Ellipta 100-62.5-25 mcg inhale 1 puff into lungs daily Isosorbide Mononitrate 30 mg Take 1/2 tablet daily (Breakfast) Brilinta 90 mg 1 tablet twice daily (Breakfast, Bedtime) Citalopram 20 mg 1.5 tablets daily (Evening Meal) Atorvastatin 40 mg 2 tablet daily (Breakfast) Metoprolol Succinate 25 mg 1 tablet daily (Evening Meal) Losartan 100 mg 1 tablet daily (Evening Meal) Pantoprazole 40 mg 1 tablet daily (Evening Meal) Loratadine 10 mg 1 tablet daily (Breakfast) Fluticasone (Flonase) Nasal Spray 50 mcg 2 sprays in both nostrils daily Albuterol Inhaler 108 Inhale 1-2 puffs every six hours prn for wheezing or shortness of breath- PRN Tramadol 50 mg 1 tablet three times daily (Breakfast, Lunch, Bedtime)   Patient declined medications last month: Fluticasone (Flonase) Nasal Spray 50 mcg 2 sprays in both nostrils daily Albuterol Inhaler 108 Inhale 1-2 puffs every six hours prn for wheezing or shortness of breath- PRN  Patient is due for next adherence delivery on: 01/13/2022 2nd Route.  Called patient and reviewed medications and coordinated delivery.  This delivery to include: Losartan 100 mg 1 tablet daily (Evening Meal) Trelegy Ellipta 100-62.5-25 mcg inhale 1 puff into lungs daily Citalopram 20 mg 1.5 tablets daily (Evening Meal) Atorvastatin 40 mg 2 tablet daily (Breakfast) Metoprolol Succinate 25 mg 1 tablet daily (Evening Meal) Pantoprazole 40 mg 1 tablet daily (Evening Meal) Loratadine 10 mg 1 tablet daily (Breakfast) Fluticasone (Flonase) Nasal Spray 50 mcg 2 sprays in both nostrils daily Tramadol  50 mg 1 tablet three times daily (Breakfast, Lunch, Bedtime)  Isosorbide Mononitrate 30 mg Take 1/2 tablet daily (Breakfast) Brilinta 90 mg 1 tablet twice daily (Breakfast, Bedtime) Pantoprazole 40 mg 1 tablet daily (Evening Meal) Albuterol Inhaler 108 Inhale 1-2 puffs every six hours prn for wheezing or shortness of breath- PRN  Patient declined the following medications: No medications were declined  Patient needs refills for Isosorbide 30 mg, Pantoprazole 40 mg (PCP Medications, and Brilinta 90 mg (Specialist).  Confirmed delivery date of 01/13/2022 2nd Route, advised patient that pharmacy will contact them the morning of delivery.  I spoke to the patient and she did confirm that she will be switching PCP's and her Initial Visit is scheduled for this upcoming Thursday. Patient stated that her children has requested the PCP change due to this provider is closer to her and they provider rides.   Patient will be unenrolled from the CCM program, but she can remain with [facility]. I have updated [facility] with the change and CPP.   Lynann Bologna, CPA/CMA Clinical Pharmacist Assistant Phone: [phone]

## 2022-01-14 ENCOUNTER — Other Ambulatory Visit: Payer: Self-pay | Admitting: Family Medicine

## 2022-01-14 ENCOUNTER — Other Ambulatory Visit: Payer: Self-pay | Admitting: Cardiovascular Disease

## 2022-01-14 ENCOUNTER — Telehealth: Payer: Self-pay | Admitting: Physician Assistant

## 2022-01-14 NOTE — Telephone Encounter (Signed)
-----   Message from Nestor Ramp, [place] sent at 01/14/2022 10:34 AM EDT ----- Please contact patient for 2 month follow up due 12-12-2021. Thank you.

## 2022-01-14 NOTE — Telephone Encounter (Signed)
LVM to schedule

## 2022-01-19 ENCOUNTER — Telehealth: Payer: Medicare Other

## 2022-02-07 ENCOUNTER — Other Ambulatory Visit: Payer: Self-pay | Admitting: Cardiovascular Disease

## 2022-02-08 NOTE — Telephone Encounter (Signed)
Please schedule overdue office visit to discuss medication. Thank you!

## 2022-02-10 ENCOUNTER — Ambulatory Visit: Payer: Medicare Other | Admitting: Physician Assistant

## 2022-02-21 ENCOUNTER — Other Ambulatory Visit: Payer: Self-pay | Admitting: Internal Medicine

## 2022-02-21 DIAGNOSIS — F17209 Nicotine dependence, unspecified, with unspecified nicotine-induced disorders: Secondary | ICD-10-CM

## 2022-02-21 DIAGNOSIS — J479 Bronchiectasis, uncomplicated: Secondary | ICD-10-CM

## 2022-02-28 ENCOUNTER — Ambulatory Visit
Admission: RE | Admit: 2022-02-28 | Discharge: 2022-02-28 | Disposition: A | Discharge status: Home / Self Care | Payer: Medicare Other | Source: Ambulatory Visit | Attending: Internal Medicine | Admitting: Internal Medicine

## 2022-02-28 DIAGNOSIS — Z122 Encounter for screening for malignant neoplasm of respiratory organs: Secondary | ICD-10-CM | POA: Insufficient documentation

## 2022-02-28 DIAGNOSIS — F17210 Nicotine dependence, cigarettes, uncomplicated: Secondary | ICD-10-CM | POA: Insufficient documentation

## 2022-02-28 DIAGNOSIS — J479 Bronchiectasis, uncomplicated: Secondary | ICD-10-CM | POA: Insufficient documentation

## 2022-02-28 DIAGNOSIS — I700 Atherosclerosis of aorta: Secondary | ICD-10-CM | POA: Insufficient documentation

## 2022-02-28 DIAGNOSIS — J439 Emphysema, unspecified: Secondary | ICD-10-CM | POA: Insufficient documentation

## 2022-02-28 DIAGNOSIS — F17209 Nicotine dependence, unspecified, with unspecified nicotine-induced disorders: Secondary | ICD-10-CM | POA: Insufficient documentation

## 2022-02-28 DIAGNOSIS — I2510 Atherosclerotic heart disease of native coronary artery without angina pectoris: Secondary | ICD-10-CM | POA: Diagnosis not present

## 2022-03-03 ENCOUNTER — Other Ambulatory Visit: Payer: Self-pay | Admitting: Family Medicine

## 2022-03-03 DIAGNOSIS — J3089 Other allergic rhinitis: Secondary | ICD-10-CM

## 2022-03-16 ENCOUNTER — Other Ambulatory Visit: Payer: Self-pay | Admitting: *Deleted

## 2022-03-16 DIAGNOSIS — Z122 Encounter for screening for malignant neoplasm of respiratory organs: Secondary | ICD-10-CM

## 2022-03-16 DIAGNOSIS — Z87891 Personal history of nicotine dependence: Secondary | ICD-10-CM

## 2022-03-16 DIAGNOSIS — F17210 Nicotine dependence, cigarettes, uncomplicated: Secondary | ICD-10-CM

## 2022-03-18 NOTE — Telephone Encounter (Signed)
Lvm to schedule

## 2022-03-31 ENCOUNTER — Other Ambulatory Visit: Payer: Self-pay | Admitting: Family Medicine

## 2022-03-31 DIAGNOSIS — K219 Gastro-esophageal reflux disease without esophagitis: Secondary | ICD-10-CM

## 2022-05-05 ENCOUNTER — Encounter: Payer: Medicare Other | Attending: Internal Medicine

## 2022-05-05 ENCOUNTER — Other Ambulatory Visit: Payer: Self-pay

## 2022-05-05 DIAGNOSIS — J449 Chronic obstructive pulmonary disease, unspecified: Secondary | ICD-10-CM

## 2022-05-05 NOTE — Progress Notes (Signed)
Virtual Visit completed. Patient informed on EP and RD appointment and 6 Minute walk test. Patient also informed of patient health questionnaires on My Chart. Patient Verbalizes understanding. Visit diagnosis can be found in [facility] 01/05/2022.

## 2022-05-11 ENCOUNTER — Encounter: Payer: Medicare Other | Attending: Internal Medicine

## 2022-05-11 VITALS — Ht 58.5 in | Wt 132.9 lb

## 2022-05-11 DIAGNOSIS — J449 Chronic obstructive pulmonary disease, unspecified: Secondary | ICD-10-CM | POA: Diagnosis present

## 2022-05-11 NOTE — Patient Instructions (Signed)
Patient Instructions  Patient Details  Name: Terri Burns MRN: [id number] Date of Birth: 1950/12/31 Referring Provider:  Waylan Rocher  Below are your personal goals for exercise, nutrition, and risk factors. Our goal is to help you stay on track towards obtaining and maintaining these goals. We will be discussing your progress on these goals with you throughout the program.  Initial Exercise Prescription:  Initial Exercise Prescription - 05/11/22 1600       Date of Initial Exercise RX and Referring Provider   Date 05/11/22    Referring Provider Dr. Farrel Gordon, MD      Oxygen   Oxygen Continuous    Maintain Oxygen Saturation 88% or higher      Recumbant Bike   Level 1    RPM 50    Watts 10    Minutes 15    METs 1.63      NuStep   Level 1    SPM 80    Minutes 15    METs 1.63      Biostep-RELP   Level 1    SPM 60    Minutes 15    METs 1.63      Track   Laps 15    Minutes 15    METs 1.82      Prescription Details   Frequency (times per week) 2    Duration Progress to 30 minutes of continuous aerobic without signs/symptoms of physical distress      Intensity   THRR 40-80% of Max Heartrate 102-134    Ratings of Perceived Exertion 11-13    Perceived Dyspnea 0-4      Progression   Progression Continue to progress workloads to maintain intensity without signs/symptoms of physical distress.      Resistance Training   Training Prescription Yes    Weight 2 lb    Reps 10-15             Exercise Goals: Frequency: Be able to perform aerobic exercise two to three times per week in program working toward 2-5 days per week of home exercise.  Intensity: Work with a perceived exertion of 11 (fairly light) - 15 (hard) while following your exercise prescription.  We will make changes to your prescription with you as you progress through the program.   Duration: Be able to do 30 to 45 minutes of continuous aerobic exercise in addition to a 5  minute warm-up and a 5 minute cool-down routine.   Nutrition Goals: Your personal nutrition goals will be established when you do your nutrition analysis with the dietician.  The following are general nutrition guidelines to follow: Cholesterol < '200mg'$ /day Sodium < '1500mg'$ /day Fiber: Women over 50 yrs - 21 grams per day  Personal Goals:  Personal Goals and Risk Factors at Admission - 05/11/22 1541       Core Components/Risk Factors/Patient Goals on Admission    Weight Management Yes;Weight Loss    Intervention Weight Management: Develop a combined nutrition and exercise program designed to reach desired caloric intake, while maintaining appropriate intake of nutrient and fiber, sodium and fats, and appropriate energy expenditure required for the weight goal.;Weight Management: Provide education and appropriate resources to help participant work on and attain dietary goals.;Weight Management/Obesity: Establish reasonable short term and long term weight goals.    Admit Weight 132 lb 14.4 oz (60.3 kg)    Goal Weight: Short Term 130 lb (59 kg)    Goal Weight: Long Term 125 lb (56.7 kg)  Expected Outcomes Short Term: Continue to assess and modify interventions until short term weight is achieved;Long Term: Adherence to nutrition and physical activity/exercise program aimed toward attainment of established weight goal;Weight Loss: Understanding of general recommendations for a balanced deficit meal plan, which promotes 1-2 lb weight loss per week and includes a negative energy balance of [phone] kcal/d;Understanding recommendations for meals to include 15-35% energy as protein, 25-35% energy from fat, 35-60% energy from carbohydrates, less than '200mg'$  of dietary cholesterol, 20-35 gm of total fiber daily;Understanding of distribution of calorie intake throughout the day with the consumption of 4-5 meals/snacks    Tobacco Cessation Yes    Number of packs per day 1/4    Intervention Assist the  participant in steps to quit. Provide individualized education and counseling about committing to Tobacco Cessation, relapse prevention, and pharmacological support that can be provided by physician.;Advice worker, assist with locating and accessing local/national Quit Smoking programs, and support quit date choice.    Expected Outcomes Short Term: Will quit all tobacco product use, adhering to prevention of relapse plan.;Short Term: Will demonstrate readiness to quit, by selecting a quit date.;Long Term: Complete abstinence from all tobacco products for at least 12 months from quit date.    Improve shortness of breath with ADL's Yes    Intervention Provide education, individualized exercise plan and daily activity instruction to help decrease symptoms of SOB with activities of daily living.    Expected Outcomes Short Term: Improve cardiorespiratory fitness to achieve a reduction of symptoms when performing ADLs;Long Term: Be able to perform more ADLs without symptoms or delay the onset of symptoms    Hypertension Yes    Intervention Provide education on lifestyle modifcations including regular physical activity/exercise, weight management, moderate sodium restriction and increased consumption of fresh fruit, vegetables, and low fat dairy, alcohol moderation, and smoking cessation.;Monitor prescription use compliance.    Expected Outcomes Short Term: Continued assessment and intervention until BP is < 140/79m HG in hypertensive participants. < 130/8108mHG in hypertensive participants with diabetes, heart failure or chronic kidney disease.;Long Term: Maintenance of blood pressure at goal levels.    Lipids Yes    Intervention Provide education and support for participant on nutrition & aerobic/resistive exercise along with prescribed medications to achieve LDL '70mg'$ , HDL >'40mg'$ .    Expected Outcomes Short Term: Participant states understanding of desired cholesterol values and is compliant  with medications prescribed. Participant is following exercise prescription and nutrition guidelines.;Long Term: Cholesterol controlled with medications as prescribed, with individualized exercise RX and with personalized nutrition plan. Value goals: LDL < '70mg'$ , HDL > 40 mg.             Tobacco Use Initial Evaluation: Social History   Tobacco Use  Smoking Status Every Day   Packs/day: 0.25   Years: 50.00   Total pack years: 12.50   Types: Cigarettes   Start date: 12/10/1973  Smokeless Tobacco Never  Tobacco Comments   1-2 ciggs per day - 04/13/2021; has a hx of 2 PPD x 10 years     Exercise Goals and Review:  Exercise Goals     Row Name 05/11/22 1551             Exercise Goals   Increase Physical Activity Yes       Intervention Provide advice, education, support and counseling about physical activity/exercise needs.;Develop an individualized exercise prescription for aerobic and resistive training based on initial evaluation findings, risk stratification, comorbidities and participant's personal goals.  Expected Outcomes Short Term: Attend rehab on a regular basis to increase amount of physical activity.;Long Term: Exercising regularly at least 3-5 days a week.;Long Term: Add in home exercise to make exercise part of routine and to increase amount of physical activity.       Increase Strength and Stamina Yes       Intervention Provide advice, education, support and counseling about physical activity/exercise needs.;Develop an individualized exercise prescription for aerobic and resistive training based on initial evaluation findings, risk stratification, comorbidities and participant's personal goals.       Expected Outcomes Short Term: Increase workloads from initial exercise prescription for resistance, speed, and METs.;Short Term: Perform resistance training exercises routinely during rehab and add in resistance training at home;Long Term: Improve cardiorespiratory fitness,  muscular endurance and strength as measured by increased METs and functional capacity (6MWT)       Able to understand and use rate of perceived exertion (RPE) scale Yes       Intervention Provide education and explanation on how to use RPE scale       Expected Outcomes Short Term: Able to use RPE daily in rehab to express subjective intensity level;Long Term:  Able to use RPE to guide intensity level when exercising independently       Able to understand and use Dyspnea scale Yes       Intervention Provide education and explanation on how to use Dyspnea scale       Expected Outcomes Short Term: Able to use Dyspnea scale daily in rehab to express subjective sense of shortness of breath during exertion;Long Term: Able to use Dyspnea scale to guide intensity level when exercising independently       Knowledge and understanding of Target Heart Rate Range (THRR) Yes       Intervention Provide education and explanation of THRR including how the numbers were predicted and where they are located for reference       Expected Outcomes Long Term: Able to use THRR to govern intensity when exercising independently;Short Term: Able to state/look up THRR;Short Term: Able to use daily as guideline for intensity in rehab       Able to check pulse independently Yes       Intervention Provide education and demonstration on how to check pulse in carotid and radial arteries.;Review the importance of being able to check your own pulse for safety during independent exercise       Expected Outcomes Short Term: Able to explain why pulse checking is important during independent exercise;Long Term: Able to check pulse independently and accurately       Understanding of Exercise Prescription Yes       Intervention Provide education, explanation, and written materials on patient's individual exercise prescription       Expected Outcomes Short Term: Able to explain program exercise prescription;Long Term: Able to explain home  exercise prescription to exercise independently

## 2022-05-11 NOTE — Progress Notes (Signed)
Pulmonary Individual Treatment Plan  Patient Details  Name: FARTUN PARADISO MRN: [id number] Date of Birth: 02-Oct-1950 Referring Provider:   Flowsheet Row Pulmonary Rehab from 05/11/2022 in [facility] Cardiac and Pulmonary Rehab  Referring Provider Dr. Farrel Gordon, MD       Initial Encounter Date:  Flowsheet Row Pulmonary Rehab from 05/11/2022 in [facility] Cardiac and Pulmonary Rehab  Date 05/11/22       Visit Diagnosis: Chronic obstructive pulmonary disease, unspecified COPD type ([place])  Patient's Home Medications on Admission:  Current Outpatient Medications:    ACCU-CHEK GUIDE test strip, USE AS DIRECTED TO Check blood glucose, Disp: , Rfl:    Accu-Chek Softclix Lancets lancets, as directed. (Patient not taking: Reported on 05/05/2022), Disp: , Rfl:    albuterol (PROVENTIL) (2.5 MG/3ML) 0.083% nebulizer solution, Take 3 mLs (2.5 mg total) by nebulization 4 (four) times daily., Disp: 75 mL, Rfl: 12   ALPRAZolam (XANAX) 0.25 MG tablet, Take 0.25 mg by mouth 2 (two) times daily as needed., Disp: , Rfl:    amLODipine (NORVASC) 10 MG tablet, Take 1 tablet (10 mg total) by mouth daily., Disp: 30 tablet, Rfl: 11   aspirin 81 MG EC tablet, TAKE 1 TABLET BY MOUTH EVERY DAY, Disp: 90 tablet, Rfl: 0   atorvastatin (LIPITOR) 20 MG tablet, Take 20 mg by mouth every morning. (Patient not taking: Reported on 05/05/2022), Disp: , Rfl:    atorvastatin (LIPITOR) 40 MG tablet, Take 2 tablets (80 mg total) by mouth daily., Disp: 180 tablet, Rfl: 1   atorvastatin (LIPITOR) 80 MG tablet, , Disp: , Rfl:    azithromycin (ZITHROMAX) 250 MG tablet, Take 250 mg by mouth daily., Disp: , Rfl:    Blood Glucose Monitoring Suppl (ACCU-CHEK GUIDE) w/Device KIT, USE AS DIRECTED TO Check blood glucose, Disp: , Rfl:    BRILINTA 90 MG TABS tablet, TAKE ONE TABLET BY MOUTH EVERY MORNING and TAKE ONE TABLET BY MOUTH EVERYDAY AT BEDTIME, Disp: 60 tablet, Rfl: 0   citalopram (CELEXA) 20 MG tablet, TAKE 1 AND 1/2 TABLETS BY  MOUTH EVERY EVENING, Disp: 90 tablet, Rfl: 0   fluticasone (FLONASE) 50 MCG/ACT nasal spray, PLACE TWO SPRAYS into BOTH nostrils DAILY, Disp: 48 g, Rfl: 1   Fluticasone-Umeclidin-Vilant (TRELEGY ELLIPTA) 100-62.5-25 MCG/ACT AEPB, Inhale 1 puff into the lungs daily., Disp: 1 each, Rfl: 11   ipratropium-albuterol (DUONEB) 0.5-2.5 (3) MG/3ML SOLN, Take by nebulization., Disp: , Rfl:    isosorbide mononitrate (IMDUR) 30 MG 24 hr tablet, TAKE 1/2 TABLET BY MOUTH ONCE DAILY, Disp: 15 tablet, Rfl: 0   loratadine (ALLERGY RELIEF) 10 MG tablet, Take 1 tablet (10 mg total) by mouth daily., Disp: 90 tablet, Rfl: 1   losartan (COZAAR) 100 MG tablet, Take 1 tablet (100 mg total) by mouth every evening., Disp: 90 tablet, Rfl: 1   metoprolol succinate (TOPROL-XL) 25 MG 24 hr tablet, Take 1 tablet (25 mg total) by mouth every evening., Disp: 90 tablet, Rfl: 1   NARCAN 4 MG/0.1ML LIQD nasal spray kit, 1 spray as needed. As needed in case of overdose (Patient not taking: Reported on 05/05/2022), Disp: , Rfl:    pantoprazole (PROTONIX) 40 MG tablet, Take 1 tablet (40 mg total) by mouth every evening., Disp: 90 tablet, Rfl: 1   predniSONE (DELTASONE) 10 MG tablet, 60 mg tomorrow, taper by 10 mg daily until complete (Patient not taking: Reported on 05/05/2022), Disp: 21 tablet, Rfl: 0   predniSONE (DELTASONE) 20 MG tablet, Take 20 mg by mouth 2 (two)  times daily. 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    Hyperlipidemia    Hypertension    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    Tobacco Use: Social History   Tobacco Use  Smoking Status Every Day   Packs/day: 0.25   Years: 50.00   Total pack years: 12.50   Types: Cigarettes   Start date: 12/10/1973  Smokeless Tobacco Never  Tobacco Comments   1-2 ciggs per day - 04/13/2021; has a hx of 2 PPD x 10 years     Labs: Review Flowsheet  More data exists      Latest Ref Rng & Units 02/21/2020 02/02/2021 02/19/2021 02/25/2021 01/05/2022  Labs for ITP Cardiac and Pulmonary Rehab  Cholestrol <200 mg/dL 173  - - - -  LDL (calc) mg/dL (calc) 61  - - - -  Direct LDL 0 - 99 mg/dL - - - 45.5  -  HDL-C > OR = 50 mg/dL 101  - - - -  Trlycerides <150 mg/dL 39  - - - -  PH, Arterial 7.350 - 7.450 - 7.36  7.24  - - -  PCO2 arterial 32.0 - 48.0 mmHg - 44  60  - - -  Bicarbonate 20.0 - 28.0 mmol/L - 24.9  25.7  22.7  - 26.2   Acid-base deficit 0.0 - 2.0 mmol/L - 0.8  2.7  3.6  - 2.8   O2 Saturation % - 97.6  99.7  88.0  - 86.1      Pulmonary Assessment Scores:  Pulmonary Assessment Scores     Row Name 05/11/22 1542         ADL UCSD   ADL Phase Entry     SOB Score total 48     Rest 0     Walk 1     Stairs 5     Bath 2     Dress 2     Shop 3       CAT Score   CAT Score 17       mMRC Score   mMRC Score 1              UCSD: Self-administered rating of dyspnea associated with activities of daily living (ADLs) 6-point scale (0 = "not at all" to 5 = "maximal or unable to do because of breathlessness")  Scoring Scores range from 0 to 120.  Minimally important difference is 5 units  CAT: CAT can identify the health impairment of COPD patients and is better correlated with disease progression.  CAT has a scoring range of zero to 40. The CAT score is classified into four groups of low (less than 10), medium (10 - 20), high (21-30) and very high  (31-40) based on the impact level of disease on health status. A CAT score over 10 suggests  significant symptoms.  A worsening CAT score could be explained by an exacerbation, poor medication adherence, poor inhaler technique, or progression of COPD or comorbid conditions.  CAT MCID is 2 points  mMRC: mMRC (Modified Medical Research Council) Dyspnea Scale is used to assess the degree of baseline functional disability in patients of respiratory disease due to dyspnea. No minimal important difference is established. A decrease in score of 1 point or greater is considered a positive change.   Pulmonary Function Assessment:  Pulmonary Function Assessment - 05/05/22 1247       Breath   Shortness of Breath Yes;Limiting activity;Panic with Shortness of Breath             Exercise Target Goals: Exercise Program Goal: Individual exercise prescription set using results from initial 6 min walk test and THRR while considering  patient's activity barriers and safety.   Exercise Prescription Goal: Initial exercise prescription builds to 30-45 minutes a day of aerobic activity, 2-3 days per week.  Home exercise guidelines will be given to patient during program as part of exercise prescription that the participant will acknowledge.  Education: Aerobic Exercise: - Group verbal and visual presentation on the components of exercise prescription. Introduces F.I.T.T principle from ACSM for exercise prescriptions.  Reviews F.I.T.T. principles of aerobic exercise including progression. Written material given at graduation. Flowsheet Row Pulmonary Rehab from 05/11/2022 in [facility] Cardiac and Pulmonary Rehab  Education need identified 05/11/22       Education: Resistance Exercise: - Group verbal and visual presentation on the components of exercise prescription. Introduces F.I.T.T principle from ACSM for exercise prescriptions  Reviews F.I.T.T. principles of resistance exercise including progression. Written  material given at graduation.    Education: Exercise & Equipment Safety: - Individual verbal instruction and demonstration of equipment use and safety with use of the equipment. Flowsheet Row Pulmonary Rehab from 05/11/2022 in [facility] Cardiac and Pulmonary Rehab  Date 05/05/22  Educator [facility]  Instruction Review Code 1- Verbalizes Understanding       Education: Exercise Physiology & General Exercise Guidelines: - Group verbal and written instruction with models to review the exercise physiology of the cardiovascular system and associated critical values. Provides general exercise guidelines with specific guidelines to those with heart or lung disease.    Education: Flexibility, Balance, Mind/Body Relaxation: - Group verbal and visual presentation with interactive activity on the components of exercise prescription. Introduces F.I.T.T principle from ACSM for exercise prescriptions. Reviews F.I.T.T. principles of flexibility and balance exercise training including progression. Also discusses the mind body connection.  Reviews various relaxation techniques to help reduce and manage stress (i.e. Deep breathing, progressive muscle relaxation, and visualization). Balance handout provided to take home. Written material given at graduation.   Activity Barriers & Risk Stratification:  Activity Barriers & Cardiac Risk Stratification - 05/11/22 1551       Activity Barriers & Cardiac Risk Stratification   Activity Barriers Neck/Spine Problems;Deconditioning;Muscular Weakness;Shortness of Breath             6 Minute Walk:  6 Minute Walk     Row Name 05/11/22 1548         6 Minute Walk   Phase Initial     Distance 655 feet     Walk Time 5.85 minutes     # of Rest Breaks 1     MPH 1.27     METS 1.63     RPE 11     Perceived Dyspnea  1     VO2  Peak 5.72     Symptoms Yes (comment)     Comments Neck/Shoulder pain (5/10)     Resting HR 71 bpm     Resting BP 128/76     Resting Oxygen  Saturation  98 %     Exercise Oxygen Saturation  during 6 min walk 89 %     Max Ex. HR 91 bpm     Max Ex. BP 148/88     2 Minute Post BP 136/82       Interval HR   1 Minute HR 80     2 Minute HR 85     3 Minute HR 87     4 Minute HR 89     5 Minute HR 91     6 Minute HR 88     2 Minute Post HR 79     Interval Heart Rate? Yes       Interval Oxygen   Interval Oxygen? Yes     Baseline Oxygen Saturation % 98 %     1 Minute Oxygen Saturation % 92 %     1 Minute Liters of Oxygen 0 L  RA     2 Minute Oxygen Saturation % 92 %     2 Minute Liters of Oxygen 0 L  RA     3 Minute Oxygen Saturation % 92 %     3 Minute Liters of Oxygen 0 L  RA     4 Minute Oxygen Saturation % 90 %     4 Minute Liters of Oxygen 0 L  RA     5 Minute Oxygen Saturation % 90 %     5 Minute Liters of Oxygen 0 L  RA     6 Minute Oxygen Saturation % 89 %     6 Minute Liters of Oxygen 0 L  RA     2 Minute Post Oxygen Saturation % 97 %     2 Minute Post Liters of Oxygen 0 L  RA             Oxygen Initial Assessment:  Oxygen Initial Assessment - 05/05/22 1247       Home Oxygen   Home Oxygen Device None    Sleep Oxygen Prescription None    Home Exercise Oxygen Prescription None    Home Resting Oxygen Prescription None      Initial 6 min Walk   Oxygen Used None      Program Oxygen Prescription   Program Oxygen Prescription None      Intervention   Short Term Goals To learn and exhibit compliance with exercise, home and travel O2 prescription;To learn and understand importance of maintaining oxygen saturations>88%;To learn and demonstrate proper use of respiratory medications;To learn and demonstrate proper pursed lip breathing techniques or other breathing techniques. ;To learn and understand importance of monitoring SPO2 with pulse oximeter and demonstrate accurate use of the pulse oximeter.    Long  Term Goals Exhibits compliance with exercise, home  and travel O2 prescription;Maintenance of O2  saturations>88%;Compliance with respiratory medication;Demonstrates proper use of MDI's;Exhibits proper breathing techniques, such as pursed lip breathing or other method taught during program session;Verbalizes importance of monitoring SPO2 with pulse oximeter and return demonstration             Oxygen Re-Evaluation:   Oxygen Discharge (Final Oxygen Re-Evaluation):   Initial Exercise Prescription:  Initial Exercise Prescription - 05/11/22 1600       Date of Initial Exercise  RX and Referring Provider   Date 05/11/22    Referring Provider Dr. Farrel Gordon, MD      Oxygen   Oxygen Continuous    Maintain Oxygen Saturation 88% or higher      Recumbant Bike   Level 1    RPM 50    Watts 10    Minutes 15    METs 1.63      NuStep   Level 1    SPM 80    Minutes 15    METs 1.63      Biostep-RELP   Level 1    SPM 60    Minutes 15    METs 1.63      Track   Laps 15    Minutes 15    METs 1.82      Prescription Details   Frequency (times per week) 2    Duration Progress to 30 minutes of continuous aerobic without signs/symptoms of physical distress      Intensity   THRR 40-80% of Max Heartrate 102-134    Ratings of Perceived Exertion 11-13    Perceived Dyspnea 0-4      Progression   Progression Continue to progress workloads to maintain intensity without signs/symptoms of physical distress.      Resistance Training   Training Prescription Yes    Weight 2 lb    Reps 10-15             Perform Capillary Blood Glucose checks as needed.  Exercise Prescription Changes:   Exercise Prescription Changes     Row Name 05/11/22 1600             Response to Exercise   Blood Pressure (Admit) 128/76       Blood Pressure (Exercise) 148/88       Blood Pressure (Exit) 136/82       Heart Rate (Admit) 71 bpm       Heart Rate (Exercise) 91 bpm       Heart Rate (Exit) 79 bpm       Oxygen Saturation (Admit) 98 %       Oxygen Saturation (Exercise) 89 %        Oxygen Saturation (Exit) 97 %       Rating of Perceived Exertion (Exercise) 11       Perceived Dyspnea (Exercise) 1       Symptoms Neck/shoulder pain (5/10)       Comments 6MWT Results                Exercise Comments:   Exercise Goals and Review:   Exercise Goals     Row Name 05/11/22 1551             Exercise Goals   Increase Physical Activity Yes       Intervention Provide advice, education, support and counseling about physical activity/exercise needs.;Develop an individualized exercise prescription for aerobic and resistive training based on initial evaluation findings, risk stratification, comorbidities and participant's personal goals.       Expected Outcomes Short Term: Attend rehab on a regular basis to increase amount of physical activity.;Long Term: Exercising regularly at least 3-5 days a week.;Long Term: Add in home exercise to make exercise part of routine and to increase amount of physical activity.       Increase Strength and Stamina Yes       Intervention Provide advice, education, support and counseling about physical activity/exercise needs.;Develop an individualized exercise prescription for aerobic  and resistive training based on initial evaluation findings, risk stratification, comorbidities and participant's personal goals.       Expected Outcomes Short Term: Increase workloads from initial exercise prescription for resistance, speed, and METs.;Short Term: Perform resistance training exercises routinely during rehab and add in resistance training at home;Long Term: Improve cardiorespiratory fitness, muscular endurance and strength as measured by increased METs and functional capacity (6MWT)       Able to understand and use rate of perceived exertion (RPE) scale Yes       Intervention Provide education and explanation on how to use RPE scale       Expected Outcomes Short Term: Able to use RPE daily in rehab to express subjective intensity level;Long Term:   Able to use RPE to guide intensity level when exercising independently       Able to understand and use Dyspnea scale Yes       Intervention Provide education and explanation on how to use Dyspnea scale       Expected Outcomes Short Term: Able to use Dyspnea scale daily in rehab to express subjective sense of shortness of breath during exertion;Long Term: Able to use Dyspnea scale to guide intensity level when exercising independently       Knowledge and understanding of Target Heart Rate Range (THRR) Yes       Intervention Provide education and explanation of THRR including how the numbers were predicted and where they are located for reference       Expected Outcomes Long Term: Able to use THRR to govern intensity when exercising independently;Short Term: Able to state/look up THRR;Short Term: Able to use daily as guideline for intensity in rehab       Able to check pulse independently Yes       Intervention Provide education and demonstration on how to check pulse in carotid and radial arteries.;Review the importance of being able to check your own pulse for safety during independent exercise       Expected Outcomes Short Term: Able to explain why pulse checking is important during independent exercise;Long Term: Able to check pulse independently and accurately       Understanding of Exercise Prescription Yes       Intervention Provide education, explanation, and written materials on patient's individual exercise prescription       Expected Outcomes Short Term: Able to explain program exercise prescription;Long Term: Able to explain home exercise prescription to exercise independently                Exercise Goals Re-Evaluation :   Discharge Exercise Prescription (Final Exercise Prescription Changes):  Exercise Prescription Changes - 05/11/22 1600       Response to Exercise   Blood Pressure (Admit) 128/76    Blood Pressure (Exercise) 148/88    Blood Pressure (Exit) 136/82    Heart  Rate (Admit) 71 bpm    Heart Rate (Exercise) 91 bpm    Heart Rate (Exit) 79 bpm    Oxygen Saturation (Admit) 98 %    Oxygen Saturation (Exercise) 89 %    Oxygen Saturation (Exit) 97 %    Rating of Perceived Exertion (Exercise) 11    Perceived Dyspnea (Exercise) 1    Symptoms Neck/shoulder pain (5/10)    Comments 6MWT Results             Nutrition:  Target Goals: Understanding of nutrition guidelines, daily intake of sodium <1536m, cholesterol <2068m calories 30% from fat and 7% or less from  saturated fats, daily to have 5 or more servings of fruits and vegetables.  Education: All About Nutrition: -Group instruction provided by verbal, written material, interactive activities, discussions, models, and posters to present general guidelines for heart healthy nutrition including fat, fiber, MyPlate, the role of sodium in heart healthy nutrition, utilization of the nutrition label, and utilization of this knowledge for meal planning. Follow up email sent as well. Written material given at graduation. Flowsheet Row Pulmonary Rehab from 05/11/2022 in [facility] Cardiac and Pulmonary Rehab  Education need identified 05/11/22       Biometrics:  Pre Biometrics - 05/11/22 1552       Pre Biometrics   Height 4' 10.5" (1.486 m)    Weight 132 lb 14.4 oz (60.3 kg)    Waist Circumference 36.5 inches    Hip Circumference 40.5 inches    Waist to Hip Ratio 0.9 %    BMI (Calculated) 27.3    Single Leg Stand 1.5 seconds   L             Nutrition Therapy Plan and Nutrition Goals:  Nutrition Therapy & Goals - 05/11/22 1505       Nutrition Therapy   Protein (specify units) 60-70g      Personal Nutrition Goals   Comments B: egg and sometimes grits L: fruit cup and some cheese - sometimes also tomatoes D: fish or chicken with cabbage (stemaed or slaw), tomatoes, with a slice of bread (white bread) Drinks: water and coffee (black).             Nutrition Assessments:  MEDIFICTS Score  Key: ?70 Need to make dietary changes  40-70 Heart Healthy Diet ? 40 Therapeutic Level Cholesterol Diet  Flowsheet Row Pulmonary Rehab from 05/11/2022 in [facility] Cardiac and Pulmonary Rehab  Picture Your Plate Total Score on Admission 70      Picture Your Plate Scores: <10 Unhealthy dietary pattern with much room for improvement. 41-50 Dietary pattern unlikely to meet recommendations for good health and room for improvement. 51-60 More healthful dietary pattern, with some room for improvement.  >60 Healthy dietary pattern, although there may be some specific behaviors that could be improved.   Nutrition Goals Re-Evaluation:   Nutrition Goals Discharge (Final Nutrition Goals Re-Evaluation):   Psychosocial: Target Goals: Acknowledge presence or absence of significant depression and/or stress, maximize coping skills, provide positive support system. Participant is able to verbalize types and ability to use techniques and skills needed for reducing stress and depression.   Education: Stress, Anxiety, and Depression - Group verbal and visual presentation to define topics covered.  Reviews how body is impacted by stress, anxiety, and depression.  Also discusses healthy ways to reduce stress and to treat/manage anxiety and depression.  Written material given at graduation.   Education: Sleep Hygiene -Provides group verbal and written instruction about how sleep can affect your health.  Define sleep hygiene, discuss sleep cycles and impact of sleep habits. Review good sleep hygiene tips.    Initial Review & Psychosocial Screening:  Initial Psych Review & Screening - 05/05/22 1249       Initial Review   Current issues with None Identified      Family Dynamics   Good Support System? Yes    Comments Amaria can look to her two sons and three grandsons for support. Her friends and familiy come over often and help her through hard times.      Barriers   Psychosocial barriers to participate  in program  The patient should benefit from training in stress management and relaxation.      Screening Interventions   Interventions Encouraged to exercise;To provide support and resources with identified psychosocial needs;Provide feedback about the scores to participant    Expected Outcomes Short Term goal: Utilizing psychosocial counselor, staff and physician to assist with identification of specific Stressors or current issues interfering with healing process. Setting desired goal for each stressor or current issue identified.;Long Term Goal: Stressors or current issues are controlled or eliminated.;Short Term goal: Identification and review with participant of any Quality of Life or Depression concerns found by scoring the questionnaire.;Long Term goal: The participant improves quality of Life and PHQ9 Scores as seen by post scores and/or verbalization of changes             Quality of Life Scores:  Scores of 19 and below usually indicate a poorer quality of life in these areas.  A difference of  2-3 points is a clinically meaningful difference.  A difference of 2-3 points in the total score of the Quality of Life Index has been associated with significant improvement in overall quality of life, self-image, physical symptoms, and general health in studies assessing change in quality of life.  PHQ-9: Review Flowsheet  More data exists      05/11/2022 10/05/2021 07/13/2021 04/02/2021 03/30/2021  Depression screen PHQ 2/9  Decreased Interest 0 0 0 0 0 0  Down, Depressed, Hopeless 0 0 0 0 0 0  PHQ - 2 Score 0 0 0 0 0 0  Altered sleeping 0 0 0 1 0  Tired, decreased energy 1 0 0 1 0  Change in appetite 0 0 0 1 0  Feeling bad or failure about yourself  0 0 0 0 0  Trouble concentrating 0 0 0 0 0  Moving slowly or fidgety/restless 0 0 0 0 0  Suicidal thoughts 0 0 0 0 0  PHQ-9 Score 1 0 0 3 0  Difficult doing work/chores Somewhat difficult Not difficult at all - Not difficult at all Not  difficult at all   Interpretation of Total Score  Total Score Depression Severity:  1-4 = Minimal depression, 5-9 = Mild depression, 10-14 = Moderate depression, 15-19 = Moderately severe depression, 20-27 = Severe depression   Psychosocial Evaluation and Intervention:  Psychosocial Evaluation - 05/05/22 1251       Psychosocial Evaluation & Interventions   Interventions Encouraged to exercise with the program and follow exercise prescription;Relaxation education;Stress management education    Comments Arlo can look to her two sons and three grandsons for support. Her friends and familiy come over often and help her through hard times.    Expected Outcomes Short: Start [facility] to help with mood. Long: Maintain a healthy mental state.    Continue Psychosocial Services  Follow up required by staff             Psychosocial Re-Evaluation:   Psychosocial Discharge (Final Psychosocial Re-Evaluation):   Education: Education Goals: Education classes will be provided on a weekly basis, covering required topics. Participant will state understanding/return demonstration of topics presented.  Learning Barriers/Preferences:  Learning Barriers/Preferences - 05/05/22 1248       Learning Barriers/Preferences   Learning Barriers None    Learning Preferences None             General Pulmonary Education Topics:  Infection Prevention: - Provides verbal and written material to individual with discussion of infection control including proper hand washing and proper equipment  cleaning during exercise session. Flowsheet Row Pulmonary Rehab from 05/11/2022 in [facility] Cardiac and Pulmonary Rehab  Date 05/05/22  Educator [facility]  Instruction Review Code 1- Verbalizes Understanding       Falls Prevention: - Provides verbal and written material to individual with discussion of falls prevention and safety. Flowsheet Row Pulmonary Rehab from 05/11/2022 in [facility] Cardiac and Pulmonary Rehab  Date  05/05/22  Educator [facility]  Instruction Review Code 1- Verbalizes Understanding       Chronic Lung Disease Review: - Group verbal instruction with posters, models, PowerPoint presentations and videos,  to review new updates, new respiratory medications, new advancements in procedures and treatments. Providing information on websites and "800" numbers for continued self-education. Includes information about supplement oxygen, available portable oxygen systems, continuous and intermittent flow rates, oxygen safety, concentrators, and Medicare reimbursement for oxygen. Explanation of Pulmonary Drugs, including class, frequency, complications, importance of spacers, rinsing mouth after steroid MDI's, and proper cleaning methods for nebulizers. Review of basic lung anatomy and physiology related to function, structure, and complications of lung disease. Review of risk factors. Discussion about methods for diagnosing sleep apnea and types of masks and machines for OSA. Includes a review of the use of types of environmental controls: home humidity, furnaces, filters, dust mite/pet prevention, HEPA vacuums. Discussion about weather changes, air quality and the benefits of nasal washing. Instruction on Warning signs, infection symptoms, calling MD promptly, preventive modes, and value of vaccinations. Review of effective airway clearance, coughing and/or vibration techniques. Emphasizing that all should Create an Action Plan. Written material given at graduation.   AED/CPR: - Group verbal and written instruction with the use of models to demonstrate the basic use of the AED with the basic ABC's of resuscitation.    Anatomy and Cardiac Procedures: - Group verbal and visual presentation and models provide information about basic cardiac anatomy and function. Reviews the testing methods done to diagnose heart disease and the outcomes of the test results. Describes the treatment choices: Medical Management,  Angioplasty, or Coronary Bypass Surgery for treating various heart conditions including Myocardial Infarction, Angina, Valve Disease, and Cardiac Arrhythmias.  Written material given at graduation.   Medication Safety: - Group verbal and visual instruction to review commonly prescribed medications for heart and lung disease. Reviews the medication, class of the drug, and side effects. Includes the steps to properly store meds and maintain the prescription regimen.  Written material given at graduation.   Other: -Provides group and verbal instruction on various topics (see comments)   Knowledge Questionnaire Score:  Knowledge Questionnaire Score - 05/11/22 1530       Knowledge Questionnaire Score   Pre Score 14/18              Core Components/Risk Factors/Patient Goals at Admission:  Personal Goals and Risk Factors at Admission - 05/11/22 1541       Core Components/Risk Factors/Patient Goals on Admission    Weight Management Yes;Weight Loss    Intervention Weight Management: Develop a combined nutrition and exercise program designed to reach desired caloric intake, while maintaining appropriate intake of nutrient and fiber, sodium and fats, and appropriate energy expenditure required for the weight goal.;Weight Management: Provide education and appropriate resources to help participant work on and attain dietary goals.;Weight Management/Obesity: Establish reasonable short term and long term weight goals.    Admit Weight 132 lb 14.4 oz (60.3 kg)    Goal Weight: Short Term 130 lb (59 kg)    Goal Weight: Long Term 125  lb (56.7 kg)    Expected Outcomes Short Term: Continue to assess and modify interventions until short term weight is achieved;Long Term: Adherence to nutrition and physical activity/exercise program aimed toward attainment of established weight goal;Weight Loss: Understanding of general recommendations for a balanced deficit meal plan, which promotes 1-2 lb weight loss per  week and includes a negative energy balance of [phone] kcal/d;Understanding recommendations for meals to include 15-35% energy as protein, 25-35% energy from fat, 35-60% energy from carbohydrates, less than 226m of dietary cholesterol, 20-35 gm of total fiber daily;Understanding of distribution of calorie intake throughout the day with the consumption of 4-5 meals/snacks    Tobacco Cessation Yes    Number of packs per day 1/4    Intervention Assist the participant in steps to quit. Provide individualized education and counseling about committing to Tobacco Cessation, relapse prevention, and pharmacological support that can be provided by physician.;OAdvice worker assist with locating and accessing local/national Quit Smoking programs, and support quit date choice.    Expected Outcomes Short Term: Will quit all tobacco product use, adhering to prevention of relapse plan.;Short Term: Will demonstrate readiness to quit, by selecting a quit date.;Long Term: Complete abstinence from all tobacco products for at least 12 months from quit date.    Improve shortness of breath with ADL's Yes    Intervention Provide education, individualized exercise plan and daily activity instruction to help decrease symptoms of SOB with activities of daily living.    Expected Outcomes Short Term: Improve cardiorespiratory fitness to achieve a reduction of symptoms when performing ADLs;Long Term: Be able to perform more ADLs without symptoms or delay the onset of symptoms    Hypertension Yes    Intervention Provide education on lifestyle modifcations including regular physical activity/exercise, weight management, moderate sodium restriction and increased consumption of fresh fruit, vegetables, and low fat dairy, alcohol moderation, and smoking cessation.;Monitor prescription use compliance.    Expected Outcomes Short Term: Continued assessment and intervention until BP is < 140/942mHG in hypertensive  participants. < 130/8042mG in hypertensive participants with diabetes, heart failure or chronic kidney disease.;Long Term: Maintenance of blood pressure at goal levels.    Lipids Yes    Intervention Provide education and support for participant on nutrition & aerobic/resistive exercise along with prescribed medications to achieve LDL <46m2mDL >40mg49m Expected Outcomes Short Term: Participant states understanding of desired cholesterol values and is compliant with medications prescribed. Participant is following exercise prescription and nutrition guidelines.;Long Term: Cholesterol controlled with medications as prescribed, with individualized exercise RX and with personalized nutrition plan. Value goals: LDL < 46mg,85m > 40 mg.             Education:Diabetes - Individual verbal and written instruction to review signs/symptoms of diabetes, desired ranges of glucose level fasting, after meals and with exercise. Acknowledge that pre and post exercise glucose checks will be done for 3 sessions at entry of program.   Know Your Numbers and Heart Failure: - Group verbal and visual instruction to discuss disease risk factors for cardiac and pulmonary disease and treatment options.  Reviews associated critical values for Overweight/Obesity, Hypertension, Cholesterol, and Diabetes.  Discusses basics of heart failure: signs/symptoms and treatments.  Introduces Heart Failure Zone chart for action plan for heart failure.  Written material given at graduation.   Core Components/Risk Factors/Patient Goals Review:    Core Components/Risk Factors/Patient Goals at Discharge (Final Review):    ITP Comments:  ITP Comments  [facility] Name 05/05/22 1246 05/11/22 1523         ITP Comments Virtual Visit completed. Patient informed on EP and RD appointment and 6 Minute walk test. Patient also informed of patient health questionnaires on My Chart. Patient Verbalizes understanding. Visit diagnosis can be  found in [facility] 01/05/2022. Completed 6MWT and gym orientation. Initial ITP created and sent for review to Dr. Ottie Glazier, Medical Director.               Comments: Initial ITP

## 2022-05-18 ENCOUNTER — Encounter: Payer: Medicare Other | Admitting: *Deleted

## 2022-05-18 ENCOUNTER — Telehealth: Payer: Self-pay | Admitting: Physician Assistant

## 2022-05-18 DIAGNOSIS — J449 Chronic obstructive pulmonary disease, unspecified: Secondary | ICD-10-CM

## 2022-05-18 NOTE — Telephone Encounter (Signed)
LVM to schedule appt, please schedule 

## 2022-05-18 NOTE — Progress Notes (Signed)
Daily Session Note  Patient Details  Name: TANIAH Burns MRN: [id number] Date of Birth: 08/08/1950 Referring Provider:   Flowsheet Row Pulmonary Rehab from 05/11/2022 in [facility] Cardiac and Pulmonary Rehab  Referring Provider Dr. Farrel Gordon, MD       Encounter Date: 05/18/2022  Check In:  Session Check In - 05/18/22 1601       Check-In   Supervising physician immediately available to respond to emergencies See telemetry face sheet for immediately available ER MD    Location [facility]-Cardiac & Pulmonary Rehab    Staff Present Renita Papa, RN BSN;Joseph Tessie Fass, RCP,RRT,BSRT;Kristen Dawson, RN,BC,MSN    Virtual Visit No    Medication changes reported     No    Fall or balance concerns reported    No    Tobacco Cessation No Change    Current number of cigarettes/nicotine per day     5    Warm-up and Cool-down Performed on first and last piece of equipment    Resistance Training Performed Yes    VAD Patient? No    PAD/SET Patient? No      Pain Assessment   Currently in Pain? No/denies                Social History   Tobacco Use  Smoking Status Every Day   Packs/day: 0.25   Years: 50.00   Total pack years: 12.50   Types: Cigarettes   Start date: 12/10/1973  Smokeless Tobacco Never  Tobacco Comments   1-2 ciggs per day - 04/13/2021; has a hx of 2 PPD x 10 years     Goals Met:  Independence with exercise equipment Exercise tolerated well No report of concerns or symptoms today Strength training completed today  Goals Unmet:  Not Applicable  Comments:First full day of exercise!  Patient was oriented to gym and equipment including functions, settings, policies, and procedures.  Patient's individual exercise prescription and treatment plan were reviewed.  All starting workloads were established based on the results of the 6 minute walk test done at initial orientation visit.  The plan for exercise progression was also introduced and progression will be  customized based on patient's performance and goals.     Dr. Emily Filbert is Medical Director for [place].  Dr. Ottie Glazier is Medical Director for [facility] Pulmonary Rehabilitation.

## 2022-05-19 ENCOUNTER — Encounter: Payer: Medicare Other | Admitting: *Deleted

## 2022-05-19 DIAGNOSIS — J449 Chronic obstructive pulmonary disease, unspecified: Secondary | ICD-10-CM | POA: Diagnosis not present

## 2022-05-19 NOTE — Progress Notes (Signed)
Daily Session Note  Patient Details  Name: Terri Burns MRN: [id number] Date of Birth: 10/01/1950 Referring Provider:   Flowsheet Row Pulmonary Rehab from 05/11/2022 in [facility] Cardiac and Pulmonary Rehab  Referring Provider Dr. Lakeshia Entzminger, MD       Encounter Date: 05/19/2022  Check In:  Session Check In - 05/19/22 1601       Check-In   Supervising physician immediately available to respond to emergencies See telemetry face sheet for immediately available ER MD    Location [facility]-Cardiac & Pulmonary Rehab    Staff Present Meredith Craven, RN BSN;Joseph Hood, RCP,RRT,BSRT;Noah Tickle, BS, Exercise Physiologist    Virtual Visit No    Medication changes reported     No    Fall or balance concerns reported    No    Tobacco Cessation Use Increase    Current number of cigarettes/nicotine per day     6    Warm-up and Cool-down Performed on first and last piece of equipment    Resistance Training Performed Yes    VAD Patient? No    PAD/SET Patient? No      Pain Assessment   Currently in Pain? No/denies                Social History   Tobacco Use  Smoking Status Every Day   Packs/day: 0.25   Years: 50.00   Total pack years: 12.50   Types: Cigarettes   Start date: 12/10/1973  Smokeless Tobacco Never  Tobacco Comments   1-2 ciggs per day - 04/13/2021; has a hx of 2 PPD x 10 years     Goals Met:  Independence with exercise equipment Exercise tolerated well No report of concerns or symptoms today Strength training completed today  Goals Unmet:  Not Applicable  Comments: Pt able to follow exercise prescription today without complaint.  Will continue to monitor for progression.    Dr. Mark Oommen is Medical Director for [facility].  Dr. Fuad Aleskerov is Medical Director for [facility] Pulmonary Rehabilitation. 

## 2022-05-25 ENCOUNTER — Encounter: Payer: Medicare Other | Admitting: *Deleted

## 2022-05-25 DIAGNOSIS — J449 Chronic obstructive pulmonary disease, unspecified: Secondary | ICD-10-CM | POA: Diagnosis not present

## 2022-05-25 NOTE — Progress Notes (Signed)
Daily Session Note  Patient Details  Name: Terri Burns MRN: [id number] Date of Birth: [birth year] Referring Provider:   Flowsheet Row Pulmonary Rehab from 05/11/2022 in [facility] Cardiac and Pulmonary Rehab  Referring Provider Dr. Farrel Gordon, MD       Encounter Date: 05/25/2022  Check In:  Session Check In - 05/25/22 1638       Check-In   Supervising physician immediately available to respond to emergencies See telemetry face sheet for immediately available ER MD    Location [facility]-Cardiac & Pulmonary Rehab    Staff Present Earlean Shawl, BS, ACSM CEP, Exercise Physiologist;Hazell Siwik Sherryll Burger, RN BSN;Joseph Tessie Fass, RCP,RRT,BSRT    Virtual Visit No    Medication changes reported     No    Fall or balance concerns reported    No    Tobacco Cessation No Change    Current number of cigarettes/nicotine per day     6    Warm-up and Cool-down Performed on first and last piece of equipment    Resistance Training Performed Yes    VAD Patient? No    PAD/SET Patient? No      Pain Assessment   Currently in Pain? No/denies                Social History   Tobacco Use  Smoking Status Every Day   Packs/day: 0.25   Years: 50.00   Total pack years: 12.50   Types: Cigarettes   Start date: 12/10/1973  Smokeless Tobacco Never  Tobacco Comments   1-2 ciggs per day - 04/13/2021; has a hx of 2 PPD x 10 years     Goals Met:  Independence with exercise equipment Exercise tolerated well No report of concerns or symptoms today Strength training completed today  Goals Unmet:  Not Applicable  Comments: Pt able to follow exercise prescription today without complaint.  Will continue to monitor for progression.    Dr. Emily Filbert is Medical Director for [place].  Dr. Ottie Glazier is Medical Director for [facility] Pulmonary Rehabilitation.

## 2022-06-01 ENCOUNTER — Encounter: Payer: Self-pay | Admitting: *Deleted

## 2022-06-01 ENCOUNTER — Encounter: Payer: Medicare Other | Admitting: *Deleted

## 2022-06-01 DIAGNOSIS — J449 Chronic obstructive pulmonary disease, unspecified: Secondary | ICD-10-CM

## 2022-06-01 NOTE — Progress Notes (Signed)
Pulmonary Individual Treatment Plan  Patient Details  Name: Terri Burns MRN: [id number] Date of Birth: 1950/12/09 Referring Provider:   Flowsheet Row Pulmonary Rehab from 05/11/2022 in [facility] Cardiac and Pulmonary Rehab  Referring Provider Dr. Farrel Gordon, MD       Initial Encounter Date:  Flowsheet Row Pulmonary Rehab from 05/11/2022 in [facility] Cardiac and Pulmonary Rehab  Date 05/11/22       Visit Diagnosis: Chronic obstructive pulmonary disease, unspecified COPD type ([place])  Patient's Home Medications on Admission:  Current Outpatient Medications:    ACCU-CHEK GUIDE test strip, USE AS DIRECTED TO Check blood glucose, Disp: , Rfl:    Accu-Chek Softclix Lancets lancets, as directed. (Patient not taking: Reported on 05/05/2022), Disp: , Rfl:    albuterol (PROVENTIL) (2.5 MG/3ML) 0.083% nebulizer solution, Take 3 mLs (2.5 mg total) by nebulization 4 (four) times daily., Disp: 75 mL, Rfl: 12   ALPRAZolam (XANAX) 0.25 MG tablet, Take 0.25 mg by mouth 2 (two) times daily as needed., Disp: , Rfl:    amLODipine (NORVASC) 10 MG tablet, Take 1 tablet (10 mg total) by mouth daily., Disp: 30 tablet, Rfl: 11   aspirin 81 MG EC tablet, TAKE 1 TABLET BY MOUTH EVERY DAY, Disp: 90 tablet, Rfl: 0   atorvastatin (LIPITOR) 20 MG tablet, Take 20 mg by mouth every morning. (Patient not taking: Reported on 05/05/2022), Disp: , Rfl:    atorvastatin (LIPITOR) 40 MG tablet, Take 2 tablets (80 mg total) by mouth daily., Disp: 180 tablet, Rfl: 1   atorvastatin (LIPITOR) 80 MG tablet, , Disp: , Rfl:    azithromycin (ZITHROMAX) 250 MG tablet, Take 250 mg by mouth daily., Disp: , Rfl:    Blood Glucose Monitoring Suppl (ACCU-CHEK GUIDE) w/Device KIT, USE AS DIRECTED TO Check blood glucose, Disp: , Rfl:    BRILINTA 90 MG TABS tablet, TAKE ONE TABLET BY MOUTH EVERY MORNING and TAKE ONE TABLET BY MOUTH EVERYDAY AT BEDTIME, Disp: 60 tablet, Rfl: 0   citalopram (CELEXA) 20 MG tablet, TAKE 1 AND 1/2 TABLETS BY  MOUTH EVERY EVENING, Disp: 90 tablet, Rfl: 0   fluticasone (FLONASE) 50 MCG/ACT nasal spray, PLACE TWO SPRAYS into BOTH nostrils DAILY, Disp: 48 g, Rfl: 1   Fluticasone-Umeclidin-Vilant (TRELEGY ELLIPTA) 100-62.5-25 MCG/ACT AEPB, Inhale 1 puff into the lungs daily., Disp: 1 each, Rfl: 11   ipratropium-albuterol (DUONEB) 0.5-2.5 (3) MG/3ML SOLN, Take by nebulization., Disp: , Rfl:    isosorbide mononitrate (IMDUR) 30 MG 24 hr tablet, TAKE 1/2 TABLET BY MOUTH ONCE DAILY, Disp: 15 tablet, Rfl: 0   loratadine (ALLERGY RELIEF) 10 MG tablet, Take 1 tablet (10 mg total) by mouth daily., Disp: 90 tablet, Rfl: 1   losartan (COZAAR) 100 MG tablet, Take 1 tablet (100 mg total) by mouth every evening., Disp: 90 tablet, Rfl: 1   metoprolol succinate (TOPROL-XL) 25 MG 24 hr tablet, Take 1 tablet (25 mg total) by mouth every evening., Disp: 90 tablet, Rfl: 1   NARCAN 4 MG/0.1ML LIQD nasal spray kit, 1 spray as needed. As needed in case of overdose (Patient not taking: Reported on 05/05/2022), Disp: , Rfl:    pantoprazole (PROTONIX) 40 MG tablet, Take 1 tablet (40 mg total) by mouth every evening., Disp: 90 tablet, Rfl: 1   predniSONE (DELTASONE) 10 MG tablet, 60 mg tomorrow, taper by 10 mg daily until complete (Patient not taking: Reported on 05/05/2022), Disp: 21 tablet, Rfl: 0   predniSONE (DELTASONE) 20 MG tablet, Take 20 mg by mouth 2 (two)  times daily. 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    Hyperlipidemia    Hypertension    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    Tobacco Use: Social History   Tobacco Use  Smoking Status Every Day   Packs/day: 0.25   Years: 50.00   Total pack years: 12.50   Types: Cigarettes   Start date: 12/10/1973  Smokeless Tobacco Never  Tobacco Comments   1-2 ciggs per day - 04/13/2021; has a hx of 2 PPD x 10 years     Labs: Review Flowsheet  More data exists      Latest Ref Rng & Units 02/21/2020 02/02/2021 02/19/2021 02/25/2021 01/05/2022  Labs for ITP Cardiac and Pulmonary Rehab  Cholestrol <200 mg/dL 173  - - - -  LDL (calc) mg/dL (calc) 61  - - - -  Direct LDL 0 - 99 mg/dL - - - 45.5  -  HDL-C > OR = 50 mg/dL 101  - - - -  Trlycerides <150 mg/dL 39  - - - -  PH, Arterial 7.350 - 7.450 - 7.36  7.24  - - -  PCO2 arterial 32.0 - 48.0 mmHg - 44  60  - - -  Bicarbonate 20.0 - 28.0 mmol/L - 24.9  25.7  22.7  - 26.2   Acid-base deficit 0.0 - 2.0 mmol/L - 0.8  2.7  3.6  - 2.8   O2 Saturation % - 97.6  99.7  88.0  - 86.1      Pulmonary Assessment Scores:  Pulmonary Assessment Scores     Row Name 05/11/22 1542         ADL UCSD   ADL Phase Entry     SOB Score total 48     Rest 0     Walk 1     Stairs 5     Bath 2     Dress 2     Shop 3       CAT Score   CAT Score 17       mMRC Score   mMRC Score 1              UCSD: Self-administered rating of dyspnea associated with activities of daily living (ADLs) 6-point scale (0 = "not at all" to 5 = "maximal or unable to do because of breathlessness")  Scoring Scores range from 0 to 120.  Minimally important difference is 5 units  CAT: CAT can identify the health impairment of COPD patients and is better correlated with disease progression.  CAT has a scoring range of zero to 40. The CAT score is classified into four groups of low (less than 10), medium (10 - 20), high (21-30) and very high  (31-40) based on the impact level of disease on health status. A CAT score over 10 suggests  significant symptoms.  A worsening CAT score could be explained by an exacerbation, poor medication adherence, poor inhaler technique, or progression of COPD or comorbid conditions.  CAT MCID is 2 points  mMRC: mMRC (Modified Medical Research Council) Dyspnea Scale is used to assess the degree of baseline functional disability in patients of respiratory disease due to dyspnea. No minimal important difference is established. A decrease in score of 1 point or greater is considered a positive change.   Pulmonary Function Assessment:  Pulmonary Function Assessment - 05/05/22 1247       Breath   Shortness of Breath Yes;Limiting activity;Panic with Shortness of Breath             Exercise Target Goals: Exercise Program Goal: Individual exercise prescription set using results from initial 6 min walk test and THRR while considering  patient's activity barriers and safety.   Exercise Prescription Goal: Initial exercise prescription builds to 30-45 minutes a day of aerobic activity, 2-3 days per week.  Home exercise guidelines will be given to patient during program as part of exercise prescription that the participant will acknowledge.  Education: Aerobic Exercise: - Group verbal and visual presentation on the components of exercise prescription. Introduces F.I.T.T principle from ACSM for exercise prescriptions.  Reviews F.I.T.T. principles of aerobic exercise including progression. Written material given at graduation. Flowsheet Row Pulmonary Rehab from 05/11/2022 in [facility] Cardiac and Pulmonary Rehab  Education need identified 05/11/22       Education: Resistance Exercise: - Group verbal and visual presentation on the components of exercise prescription. Introduces F.I.T.T principle from ACSM for exercise prescriptions  Reviews F.I.T.T. principles of resistance exercise including progression. Written  material given at graduation.    Education: Exercise & Equipment Safety: - Individual verbal instruction and demonstration of equipment use and safety with use of the equipment. Flowsheet Row Pulmonary Rehab from 05/11/2022 in [facility] Cardiac and Pulmonary Rehab  Date 05/05/22  Educator [facility]  Instruction Review Code 1- Verbalizes Understanding       Education: Exercise Physiology & General Exercise Guidelines: - Group verbal and written instruction with models to review the exercise physiology of the cardiovascular system and associated critical values. Provides general exercise guidelines with specific guidelines to those with heart or lung disease.    Education: Flexibility, Balance, Mind/Body Relaxation: - Group verbal and visual presentation with interactive activity on the components of exercise prescription. Introduces F.I.T.T principle from ACSM for exercise prescriptions. Reviews F.I.T.T. principles of flexibility and balance exercise training including progression. Also discusses the mind body connection.  Reviews various relaxation techniques to help reduce and manage stress (i.e. Deep breathing, progressive muscle relaxation, and visualization). Balance handout provided to take home. Written material given at graduation.   Activity Barriers & Risk Stratification:  Activity Barriers & Cardiac Risk Stratification - 05/11/22 1551       Activity Barriers & Cardiac Risk Stratification   Activity Barriers Neck/Spine Problems;Deconditioning;Muscular Weakness;Shortness of Breath             6 Minute Walk:  6 Minute Walk     Row Name 05/11/22 1548         6 Minute Walk   Phase Initial     Distance 655 feet     Walk Time 5.85 minutes     # of Rest Breaks 1     MPH 1.27     METS 1.63     RPE 11     Perceived Dyspnea  1     VO2  Peak 5.72     Symptoms Yes (comment)     Comments Neck/Shoulder pain (5/10)     Resting HR 71 bpm     Resting BP 128/76     Resting Oxygen  Saturation  98 %     Exercise Oxygen Saturation  during 6 min walk 89 %     Max Ex. HR 91 bpm     Max Ex. BP 148/88     2 Minute Post BP 136/82       Interval HR   1 Minute HR 80     2 Minute HR 85     3 Minute HR 87     4 Minute HR 89     5 Minute HR 91     6 Minute HR 88     2 Minute Post HR 79     Interval Heart Rate? Yes       Interval Oxygen   Interval Oxygen? Yes     Baseline Oxygen Saturation % 98 %     1 Minute Oxygen Saturation % 92 %     1 Minute Liters of Oxygen 0 L  RA     2 Minute Oxygen Saturation % 92 %     2 Minute Liters of Oxygen 0 L  RA     3 Minute Oxygen Saturation % 92 %     3 Minute Liters of Oxygen 0 L  RA     4 Minute Oxygen Saturation % 90 %     4 Minute Liters of Oxygen 0 L  RA     5 Minute Oxygen Saturation % 90 %     5 Minute Liters of Oxygen 0 L  RA     6 Minute Oxygen Saturation % 89 %     6 Minute Liters of Oxygen 0 L  RA     2 Minute Post Oxygen Saturation % 97 %     2 Minute Post Liters of Oxygen 0 L  RA             Oxygen Initial Assessment:  Oxygen Initial Assessment - 05/05/22 1247       Home Oxygen   Home Oxygen Device None    Sleep Oxygen Prescription None    Home Exercise Oxygen Prescription None    Home Resting Oxygen Prescription None      Initial 6 min Walk   Oxygen Used None      Program Oxygen Prescription   Program Oxygen Prescription None      Intervention   Short Term Goals To learn and exhibit compliance with exercise, home and travel O2 prescription;To learn and understand importance of maintaining oxygen saturations>88%;To learn and demonstrate proper use of respiratory medications;To learn and demonstrate proper pursed lip breathing techniques or other breathing techniques. ;To learn and understand importance of monitoring SPO2 with pulse oximeter and demonstrate accurate use of the pulse oximeter.    Long  Term Goals Exhibits compliance with exercise, home  and travel O2 prescription;Maintenance of O2  saturations>88%;Compliance with respiratory medication;Demonstrates proper use of MDI's;Exhibits proper breathing techniques, such as pursed lip breathing or other method taught during program session;Verbalizes importance of monitoring SPO2 with pulse oximeter and return demonstration             Oxygen Re-Evaluation:  Oxygen Re-Evaluation     Row Name 05/18/22 1614             Goals/Expected Outcomes   Short Term  Goals To learn and exhibit compliance with exercise, home and travel O2 prescription;To learn and understand importance of maintaining oxygen saturations>88%;To learn and demonstrate proper use of respiratory medications;To learn and demonstrate proper pursed lip breathing techniques or other breathing techniques. ;To learn and understand importance of monitoring SPO2 with pulse oximeter and demonstrate accurate use of the pulse oximeter.       Long  Term Goals Exhibits compliance with exercise, home  and travel O2 prescription;Maintenance of O2 saturations>88%;Compliance with respiratory medication;Demonstrates proper use of MDI's;Exhibits proper breathing techniques, such as pursed lip breathing or other method taught during program session;Verbalizes importance of monitoring SPO2 with pulse oximeter and return demonstration       Comments Reviewed PLB technique with pt.  Talked about how it works and it's importance in maintaining their exercise saturations.       Goals/Expected Outcomes Short: Become more profiecient at using PLB.   Long: Become independent at using PLB.                Oxygen Discharge (Final Oxygen Re-Evaluation):  Oxygen Re-Evaluation - 05/18/22 1614       Goals/Expected Outcomes   Short Term Goals To learn and exhibit compliance with exercise, home and travel O2 prescription;To learn and understand importance of maintaining oxygen saturations>88%;To learn and demonstrate proper use of respiratory medications;To learn and demonstrate proper pursed  lip breathing techniques or other breathing techniques. ;To learn and understand importance of monitoring SPO2 with pulse oximeter and demonstrate accurate use of the pulse oximeter.    Long  Term Goals Exhibits compliance with exercise, home  and travel O2 prescription;Maintenance of O2 saturations>88%;Compliance with respiratory medication;Demonstrates proper use of MDI's;Exhibits proper breathing techniques, such as pursed lip breathing or other method taught during program session;Verbalizes importance of monitoring SPO2 with pulse oximeter and return demonstration    Comments Reviewed PLB technique with pt.  Talked about how it works and it's importance in maintaining their exercise saturations.    Goals/Expected Outcomes Short: Become more profiecient at using PLB.   Long: Become independent at using PLB.             Initial Exercise Prescription:  Initial Exercise Prescription - 05/11/22 1600       Date of Initial Exercise RX and Referring Provider   Date 05/11/22    Referring Provider Dr. Farrel Gordon, MD      Oxygen   Oxygen Continuous    Maintain Oxygen Saturation 88% or higher      Recumbant Bike   Level 1    RPM 50    Watts 10    Minutes 15    METs 1.63      NuStep   Level 1    SPM 80    Minutes 15    METs 1.63      Biostep-RELP   Level 1    SPM 60    Minutes 15    METs 1.63      Track   Laps 15    Minutes 15    METs 1.82      Prescription Details   Frequency (times per week) 2    Duration Progress to 30 minutes of continuous aerobic without signs/symptoms of physical distress      Intensity   THRR 40-80% of Max Heartrate 102-134    Ratings of Perceived Exertion 11-13    Perceived Dyspnea 0-4      Progression   Progression Continue to progress workloads to maintain  intensity without signs/symptoms of physical distress.      Resistance Training   Training Prescription Yes    Weight 2 lb    Reps 10-15             Perform Capillary  Blood Glucose checks as needed.  Exercise Prescription Changes:   Exercise Prescription Changes     Row Name 05/11/22 1600 05/31/22 1400           Response to Exercise   Blood Pressure (Admit) 128/76 124/72      Blood Pressure (Exercise) 148/88 140/86      Blood Pressure (Exit) 136/82 110/60      Heart Rate (Admit) 71 bpm 79 bpm      Heart Rate (Exercise) 91 bpm 101 bpm      Heart Rate (Exit) 79 bpm 81 bpm      Oxygen Saturation (Admit) 98 % 97 %      Oxygen Saturation (Exercise) 89 % 90 %      Oxygen Saturation (Exit) 97 % 96 %      Rating of Perceived Exertion (Exercise) 11 15      Perceived Dyspnea (Exercise) 1 2      Symptoms Neck/shoulder pain (5/10) --      Comments 6MWT Results First three sessions in rehab      Duration -- Progress to 30 minutes of  aerobic without signs/symptoms of physical distress      Intensity -- THRR unchanged        Progression   Progression -- Continue to progress workloads to maintain intensity without signs/symptoms of physical distress.      Average METs -- 1.69        Resistance Training   Training Prescription -- Yes      Weight -- 2 lb      Reps -- 10-15        Interval Training   Interval Training -- No        NuStep   Level -- 3      Minutes -- 15      METs -- 1.9        Biostep-RELP   Level -- 1      Minutes -- 15      METs -- 1        Track   Laps -- 17      Minutes -- 15      METs -- 1.92        Oxygen   Maintain Oxygen Saturation -- 88% or higher               Exercise Comments:   Exercise Comments     Row Name 05/18/22 1613           Exercise Comments First full day of exercise!  Patient was oriented to gym and equipment including functions, settings, policies, and procedures.  Patient's individual exercise prescription and treatment plan were reviewed.  All starting workloads were established based on the results of the 6 minute walk test done at initial orientation visit.  The plan for exercise  progression was also introduced and progression will be customized based on patient's performance and goals.                Exercise Goals and Review:   Exercise Goals     Row Name 05/11/22 1551             Exercise Goals   Increase Physical Activity Yes  Intervention Provide advice, education, support and counseling about physical activity/exercise needs.;Develop an individualized exercise prescription for aerobic and resistive training based on initial evaluation findings, risk stratification, comorbidities and participant's personal goals.       Expected Outcomes Short Term: Attend rehab on a regular basis to increase amount of physical activity.;Long Term: Exercising regularly at least 3-5 days a week.;Long Term: Add in home exercise to make exercise part of routine and to increase amount of physical activity.       Increase Strength and Stamina Yes       Intervention Provide advice, education, support and counseling about physical activity/exercise needs.;Develop an individualized exercise prescription for aerobic and resistive training based on initial evaluation findings, risk stratification, comorbidities and participant's personal goals.       Expected Outcomes Short Term: Increase workloads from initial exercise prescription for resistance, speed, and METs.;Short Term: Perform resistance training exercises routinely during rehab and add in resistance training at home;Long Term: Improve cardiorespiratory fitness, muscular endurance and strength as measured by increased METs and functional capacity (6MWT)       Able to understand and use rate of perceived exertion (RPE) scale Yes       Intervention Provide education and explanation on how to use RPE scale       Expected Outcomes Short Term: Able to use RPE daily in rehab to express subjective intensity level;Long Term:  Able to use RPE to guide intensity level when exercising independently       Able to understand and use  Dyspnea scale Yes       Intervention Provide education and explanation on how to use Dyspnea scale       Expected Outcomes Short Term: Able to use Dyspnea scale daily in rehab to express subjective sense of shortness of breath during exertion;Long Term: Able to use Dyspnea scale to guide intensity level when exercising independently       Knowledge and understanding of Target Heart Rate Range (THRR) Yes       Intervention Provide education and explanation of THRR including how the numbers were predicted and where they are located for reference       Expected Outcomes Long Term: Able to use THRR to govern intensity when exercising independently;Short Term: Able to state/look up THRR;Short Term: Able to use daily as guideline for intensity in rehab       Able to check pulse independently Yes       Intervention Provide education and demonstration on how to check pulse in carotid and radial arteries.;Review the importance of being able to check your own pulse for safety during independent exercise       Expected Outcomes Short Term: Able to explain why pulse checking is important during independent exercise;Long Term: Able to check pulse independently and accurately       Understanding of Exercise Prescription Yes       Intervention Provide education, explanation, and written materials on patient's individual exercise prescription       Expected Outcomes Short Term: Able to explain program exercise prescription;Long Term: Able to explain home exercise prescription to exercise independently                Exercise Goals Re-Evaluation :  Exercise Goals Re-Evaluation     Row Name 05/18/22 1613 05/31/22 1502           Exercise Goal Re-Evaluation   Exercise Goals Review Increase Physical Activity;Able to understand and use rate of perceived exertion (RPE)  scale;Understanding of Exercise Prescription;Knowledge and understanding of Target Heart Rate Range (THRR);Increase Strength and Stamina;Able to  check pulse independently Increase Physical Activity;Increase Strength and Stamina;Understanding of Exercise Prescription      Comments Reviewed RPE scale, THR and program prescription with pt today.  Pt voiced understanding and was given a copy of goals to take home. Keltie is off to a good start in the program. She had an average MET level of 1.69 METs during her first three sessions. She also was able to improve to level 3 on the T4. She consistently walked 17 laps on the track as well. We will continue to monitor her progress in the program.      Expected Outcomes Short: Use RPE daily to regulate intensity.  Long: Follow program prescription in THR. Short: Continue to follow current exercise prescription. Long: Continue to improve strength and stamina.               Discharge Exercise Prescription (Final Exercise Prescription Changes):  Exercise Prescription Changes - 05/31/22 1400       Response to Exercise   Blood Pressure (Admit) 124/72    Blood Pressure (Exercise) 140/86    Blood Pressure (Exit) 110/60    Heart Rate (Admit) 79 bpm    Heart Rate (Exercise) 101 bpm    Heart Rate (Exit) 81 bpm    Oxygen Saturation (Admit) 97 %    Oxygen Saturation (Exercise) 90 %    Oxygen Saturation (Exit) 96 %    Rating of Perceived Exertion (Exercise) 15    Perceived Dyspnea (Exercise) 2    Comments First three sessions in rehab    Duration Progress to 30 minutes of  aerobic without signs/symptoms of physical distress    Intensity THRR unchanged      Progression   Progression Continue to progress workloads to maintain intensity without signs/symptoms of physical distress.    Average METs 1.69      Resistance Training   Training Prescription Yes    Weight 2 lb    Reps 10-15      Interval Training   Interval Training No      NuStep   Level 3    Minutes 15    METs 1.9      Biostep-RELP   Level 1    Minutes 15    METs 1      Track   Laps 17    Minutes 15    METs 1.92       Oxygen   Maintain Oxygen Saturation 88% or higher             Nutrition:  Target Goals: Understanding of nutrition guidelines, daily intake of sodium <1547m, cholesterol <2036m calories 30% from fat and 7% or less from saturated fats, daily to have 5 or more servings of fruits and vegetables.  Education: All About Nutrition: -Group instruction provided by verbal, written material, interactive activities, discussions, models, and posters to present general guidelines for heart healthy nutrition including fat, fiber, MyPlate, the role of sodium in heart healthy nutrition, utilization of the nutrition label, and utilization of this knowledge for meal planning. Follow up email sent as well. Written material given at graduation. Flow[facility]  Education need identified 05/11/22       Biometrics:  Pre Biometrics - 05/11/22 1552       Pre Biometrics   Height 4' 10.5" (1.486 m)    Weight 132  lb 14.4 oz (60.3 kg)    Waist Circumference 36.5 inches    Hip Circumference 40.5 inches    Waist to Hip Ratio 0.9 %    BMI (Calculated) 27.3    Single Leg Stand 1.5 seconds   L             Nutrition Therapy Plan and Nutrition Goals:  Nutrition Therapy & Goals - 05/11/22 1505       Nutrition Therapy   Diet Heart healthy, low Na, pulmonary MNT.    Drug/Food Interactions Statins/Certain Fruits    Protein (specify units) 60-70g    Fiber 25 grams    Whole Grain Foods 3 servings    Saturated Fats 12 max. grams    Fruits and Vegetables 5 servings/day    Sodium 2 grams      Personal Nutrition Goals   Nutrition Goal ST: eat a protein source at each meal/snack, consider including ensure into diet LT: meet protein/energy needs, limit saturated fat <12g/day, limit sodium <2g/day    Comments 71 y.o F admitted to pulmonary rehab for COPD. PMHx includes CAD, HFpEF, HTN, HLD, tobacco use. Relevant medications lipitor, clelexa,  trelegy, protonix, prednisone, tramadol, xanax. B: egg and sometimes grits L: fruit cup and some cheese - sometimes also tomatoes D: fish or chicken with cabbage (stemaed or slaw), tomatoes, with a slice of bread (white bread) Drinks: water and coffee (black). She reads food labels, uses Mrs Deliah Boston seasoning, and general eats whole or minimally processed foods. Trenise reports hvaing issues with hypoglycemia - no hx of diabetes. Discussed heart healthy eating, pulmonary MNT, and nutrition for maintaining a stable BG. Maty wanted to review other protein sources besides meat as well - discussed other options as well as protein needs - she reports having ensure in the past.      Intervention Plan   Intervention Prescribe, educate and counsel regarding individualized specific dietary modifications aiming towards targeted core components such as weight, hypertension, lipid management, diabetes, heart failure and other comorbidities.;Nutrition handout(s) given to patient.    Expected Outcomes Short Term Goal: A plan has been developed with personal nutrition goals set during dietitian appointment.;Long Term Goal: Adherence to prescribed nutrition plan.;Short Term Goal: Understand basic principles of dietary content, such as calories, fat, sodium, cholesterol and nutrients.             Nutrition Assessments:  MEDIFICTS Score Key: ?70 Need to make dietary changes  40-70 Heart Healthy Diet ? 40 Therapeutic Level Cholesterol Diet  Flowsheet Row Pulmonary Rehab from [facility]/11/2022 in [facility] Card[facility] Pul[facility] Rehab  Picture Your Plate Total Score on Admission 70      Picture Your Plate Scores: <26 Unhealthy dietary pattern with much room for improvement. 41-50 Dietary pattern unlikely to meet recommendations for good health and room for improvement. 51-60 More healthful dietary pattern, with some room for improvement.  >60 Healthy dietary pattern, although there may be some specific behaviors that could be  improved.   Nutrition Goals Re-Evaluation:   Nutrition Goals Discharge (Final Nutrition Goals Re-Evaluation):   Psychosocial: Target Goals: Acknowledge presence or absence of significant depression and/or stress, maximize coping skills, provide positive support system. Participant is able to verbalize types and ability to use techniques and skills needed for reducing stress and depression.   Education: Stress, Anxiety, and Depression - Group verbal and visual presentation to define topics covered.  Reviews how body is impacted by stress, anxiety, and depression.  Also discusses healthy ways to reduce stress  and to treat/manage anxiety and depression.  Written material given at graduation.   Education: Sleep Hygiene -Provides group verbal and written instruction about how sleep can affect your health.  Define sleep hygiene, discuss sleep cycles and impact of sleep habits. Review good sleep hygiene tips.    Initial Review & Psychosocial Screening:  Initial Psych Review & Screening - 05/05/22 1249       Initial Review   Current issues with None Identified      Family Dynamics   Good Support System? Yes    Comments Desiraye can look to her two sons and three grandsons for support. Her friends and familiy come over often and help her through hard times.      Barriers   Psychosocial barriers to participate in program The patient should benefit from training in stress management and relaxation.      Screening Interventions   Interventions Encouraged to exercise;To provide support and resources with identified psychosocial needs;Provide feedback about the scores to participant    Expected Outcomes Short Term goal: Utilizing psychosocial counselor, staff and physician to assist with identification of specific Stressors or current issues interfering with healing process. Setting desired goal for each stressor or current issue identified.;Long Term Goal: Stressors or current issues are  controlled or eliminated.;Short Term goal: Identification and review with participant of any Quality of Life or Depression concerns found by scoring the questionnaire.;Long Term goal: The participant improves quality of Life and PHQ9 Scores as seen by post scores and/or verbalization of changes             Quality of Life Scores:  Scores of 19 and below usually indicate a poorer quality of life in these areas.  A difference of  2-3 points is a clinically meaningful difference.  A difference of 2-3 points in the total score of the Quality of Life Index has been associated with significant improvement in overall quality of life, self-image, physical symptoms, and general health in studies assessing change in quality of life.  PHQ-9: Review Flowsheet  More data exists      05/11/2022 10/05/2021 07/13/2021 04/02/2021 03/30/2021  Depression screen PHQ 2/9  Decreased Interest 0 0 0 0 0 0  Down, Depressed, Hopeless 0 0 0 0 0 0  PHQ - 2 Score 0 0 0 0 0 0  Altered sleeping 0 0 0 1 0  Tired, decreased energy 1 0 0 1 0  Change in appetite 0 0 0 1 0  Feeling bad or failure about yourself  0 0 0 0 0  Trouble concentrating 0 0 0 0 0  Moving slowly or fidgety/restless 0 0 0 0 0  Suicidal thoughts 0 0 0 0 0  PHQ-9 Score 1 0 0 3 0  Difficult doing work/chores Somewhat difficult Not difficult at all - Not difficult at all Not difficult at all   Interpretation of Total Score  Total Score Depression Severity:  1-4 = Minimal depression, 5-9 = Mild depression, 10-14 = Moderate depression, 15-19 = Moderately severe depression, 20-27 = Severe depression   Psychosocial Evaluation and Intervention:  Psychosocial Evaluation - 05/05/22 1251       Psychosocial Evaluation & Interventions   Interventions Encouraged to exercise with the program and follow exercise prescription;Relaxation education;Stress management education    Comments Annalina can look to her two sons and three grandsons for support. Her friends  and familiy come over often and help her through hard times.    Expected Outcomes Short: Start  [employer] to help with mood. Long: Maintain a healthy mental state.    Continue Psychosocial Services  Follow up required by staff             Psychosocial Re-Evaluation:   Psychosocial Discharge (Final Psychosocial Re-Evaluation):   Education: Education Goals: Education classes will be provided on a weekly basis, covering required topics. Participant will state understanding/return demonstration of topics presented.  Learning Barriers/Preferences:  Learning Barriers/Preferences - 05/05/22 1248       Learning Barriers/Preferences   Learning Barriers None    Learning Preferences None             General Pulmonary Education Topics:  Infection Prevention: - Provides verbal and written material to individual with discussion of infection control including proper hand washing and proper equipment cleaning during exercise session. Flowsheet Row Pulmonary Rehab from 05/11/2022 in [facility] Cardiac and Pulmonary Rehab  Date 05/05/22  Educator [facility]  Instruction Review Code 1- Verbalizes Understanding       Falls Prevention: - Provides verbal and written material to individual with discussion of falls prevention and safety. Flowsheet Row Pulmonary Rehab from 05/11/2022 in [facility] Cardiac and Pulmonary Rehab  Date 05/05/22  Educator [facility]  Instruction Review Code 1- Verbalizes Understanding       Chronic Lung Disease Review: - Group verbal instruction with posters, models, PowerPoint presentations and videos,  to review new updates, new respiratory medications, new advancements in procedures and treatments. Providing information on websites and "800" numbers for continued self-education. Includes information about supplement oxygen, available portable oxygen systems, continuous and intermittent flow rates, oxygen safety, concentrators, and Medicare reimbursement for oxygen. Explanation of  Pulmonary Drugs, including class, frequency, complications, importance of spacers, rinsing mouth after steroid MDI's, and proper cleaning methods for nebulizers. Review of basic lung anatomy and physiology related to function, structure, and complications of lung disease. Review of risk factors. Discussion about methods for diagnosing sleep apnea and types of masks and machines for OSA. Includes a review of the use of types of environmental controls: home humidity, furnaces, filters, dust mite/pet prevention, HEPA vacuums. Discussion about weather changes, air quality and the benefits of nasal washing. Instruction on Warning signs, infection symptoms, calling MD promptly, preventive modes, and value of vaccinations. Review of effective airway clearance, coughing and/or vibration techniques. Emphasizing that all should Create an Action Plan. Written material given at graduation.   AED/CPR: - Group verbal and written instruction with the use of models to demonstrate the basic use of the AED with the basic ABC's of resuscitation.    Anatomy and Cardiac Procedures: - Group verbal and visual presentation and models provide information about basic cardiac anatomy and function. Reviews the testing methods done to diagnose heart disease and the outcomes of the test results. Describes the treatment choices: Medical Management, Angioplasty, or Coronary Bypass Surgery for treating various heart conditions including Myocardial Infarction, Angina, Valve Disease, and Cardiac Arrhythmias.  Written material given at graduation.   Medication Safety: - Group verbal and visual instruction to review commonly prescribed medications for heart and lung disease. Reviews the medication, class of the drug, and side effects. Includes the steps to properly store meds and maintain the prescription regimen.  Written material given at graduation.   Other: -Provides group and verbal instruction on various topics (see  comments)   Knowledge Questionnaire Score:  Knowledge Questionnaire Score - 05/11/22 1530       Knowledge Questionnaire Score   Pre Score 14/18  Core Components/Risk Factors/Patient Goals at Admission:  Personal Goals and Risk Factors at Admission - 05/11/22 1541       Core Components/Risk Factors/Patient Goals on Admission    Weight Management Yes;Weight Loss    Intervention Weight Management: Develop a combined nutrition and exercise program designed to reach desired caloric intake, while maintaining appropriate intake of nutrient and fiber, sodium and fats, and appropriate energy expenditure required for the weight goal.;Weight Management: Provide education and appropriate resources to help participant work on and attain dietary goals.;Weight Management/Obesity: Establish reasonable short term and long term weight goals.    Admit Weight 132 lb 14.4 oz (60.3 kg)    Goal Weight: Short Term 130 lb (59 kg)    Goal Weight: Long Term 125 lb (56.7 kg)    Expected Outcomes Short Term: Continue to assess and modify interventions until short term weight is achieved;Long Term: Adherence to nutrition and physical activity/exercise program aimed toward attainment of established weight goal;Weight Loss: Understanding of general recommendations for a balanced deficit meal plan, which promotes 1-2 lb weight loss per week and includes a negative energy balance of [phone] kcal/d;Understanding recommendations for meals to include 15-35% energy as protein, 25-35% energy from fat, 35-60% energy from carbohydrates, less than 229m of dietary cholesterol, 20-35 gm of total fiber daily;Understanding of distribution of calorie intake throughout the day with the consumption of 4-5 meals/snacks    Tobacco Cessation Yes    Number of packs per day 1/4    Intervention Assist the participant in steps to quit. Provide individualized education and counseling about committing to Tobacco Cessation, relapse  prevention, and pharmacological support that can be provided by physician.;OAdvice worker assist with locating and accessing local/national Quit Smoking programs, and support quit date choice.    Expected Outcomes Short Term: Will quit all tobacco product use, adhering to prevention of relapse plan.;Short Term: Will demonstrate readiness to quit, by selecting a quit date.;Long Term: Complete abstinence from all tobacco products for at least 12 months from quit date.    Improve shortness of breath with ADL's Yes    Intervention Provide education, individualized exercise plan and daily activity instruction to help decrease symptoms of SOB with activities of daily living.    Expected Outcomes Short Term: Improve cardiorespiratory fitness to achieve a reduction of symptoms when performing ADLs;Long Term: Be able to perform more ADLs without symptoms or delay the onset of symptoms    Hypertension Yes    Intervention Provide education on lifestyle modifcations including regular physical activity/exercise, weight management, moderate sodium restriction and increased consumption of fresh fruit, vegetables, and low fat dairy, alcohol moderation, and smoking cessation.;Monitor prescription use compliance.    Expected Outcomes Short Term: Continued assessment and intervention until BP is < 140/932mHG in hypertensive participants. < 130/8061mG in hypertensive participants with diabetes, heart failure or chronic kidney disease.;Long Term: Maintenance of blood pressure at goal levels.    Lipids Yes    Intervention Provide education and support for participant on nutrition & aerobic/resistive exercise along with prescribed medications to achieve LDL <10m72mDL >40mg25m Expected Outcomes Short Term: Participant states understanding of desired cholesterol values and is compliant with medications prescribed. Participant is following exercise prescription and nutrition guidelines.;Long Term: Cholesterol  controlled with medications as prescribed, with individualized exercise RX and with personalized nutrition plan. Value goals: LDL < 10mg,10m > 40 mg.             Education:Diabetes - Individual verbal and  written instruction to review signs/symptoms of diabetes, desired ranges of glucose level fasting, after meals and with exercise. Acknowledge that pre and post exercise glucose checks will be done for 3 sessions at entry of program.   Know Your Numbers and Heart Failure: - Group verbal and visual instruction to discuss disease risk factors for cardiac and pulmonary disease and treatment options.  Reviews associated critical values for Overweight/Obesity, Hypertension, Cholesterol, and Diabetes.  Discusses basics of heart failure: signs/symptoms and treatments.  Introduces Heart Failure Zone chart for action plan for heart failure.  Written material given at graduation.   Core Components/Risk Factors/Patient Goals Review:    Core Components/Risk Factors/Patient Goals at Discharge (Final Review):    ITP Comments:  ITP Comments     Row Name 05/05/22 1246 05/11/22 1523 05/18/22 1613 06/01/22 1052     ITP Comments Virtual Visit completed. Patient informed on EP and RD appointment and 6 Minute walk test. Patient also informed of patient health questionnaires on My Chart. Patient Verbalizes understanding. Visit diagnosis can be found in [facility] 01/05/2022. Completed 6MWT and gym orientation. Initial ITP created and sent for review to Dr. Ottie Glazier, Medical Director. First full day of exercise!  Patient was oriented to gym and equipment including functions, settings, policies, and procedures.  Patient's individual exercise prescription and treatment plan were reviewed.  All starting workloads were established based on the results of the 6 minute walk test done at initial orientation visit.  The plan for exercise progression was also introduced and progression will be customized based on patient's  performance and goals. 30 Day review completed. Medical Director ITP review done, changes made as directed, and signed approval by Medical Director.    new to program             Comments:

## 2022-06-01 NOTE — Progress Notes (Signed)
Daily Session Note  Patient Details  Name: Terri Burns MRN: [id number] Date of Birth: 10-29-1950 Referring Provider:   Flowsheet Row Pulmonary Rehab from 05/11/2022 in [facility] Cardiac and Pulmonary Rehab  Referring Provider Dr. Farrel Gordon, MD       Encounter Date: 06/01/2022  Check In:  Session Check In - 06/01/22 1609       Check-In   Supervising physician immediately available to respond to emergencies See telemetry face sheet for immediately available ER MD    Location [facility]-Cardiac & Pulmonary Rehab    Staff Present Renita Papa, RN BSN;Kristen Coble, RN,BC,MSN;Megan Tamala Julian, RN, ADN    Virtual Visit No    Medication changes reported     No    Fall or balance concerns reported    No    Tobacco Cessation No Change    Current number of cigarettes/nicotine per day     6    Warm-up and Cool-down Performed on first and last piece of equipment    Resistance Training Performed Yes    VAD Patient? No    PAD/SET Patient? No      Pain Assessment   Currently in Pain? No/denies                Social History   Tobacco Use  Smoking Status Every Day   Packs/day: 0.25   Years: 50.00   Total pack years: 12.50   Types: Cigarettes   Start date: 12/10/1973  Smokeless Tobacco Never  Tobacco Comments   1-2 ciggs per day - 04/13/2021; has a hx of 2 PPD x 10 years     Goals Met:  Independence with exercise equipment Exercise tolerated well No report of concerns or symptoms today Strength training completed today  Goals Unmet:  Not Applicable  Comments: Pt able to follow exercise prescription today without complaint.  Will continue to monitor for progression.    Dr. Emily Filbert is Medical Director for [place].  Dr. Ottie Glazier is Medical Director for [facility] Pulmonary Rehabilitation.

## 2022-06-08 ENCOUNTER — Encounter: Payer: 59 | Attending: Internal Medicine | Admitting: *Deleted

## 2022-06-08 DIAGNOSIS — J449 Chronic obstructive pulmonary disease, unspecified: Secondary | ICD-10-CM | POA: Diagnosis not present

## 2022-06-08 NOTE — Progress Notes (Signed)
Daily Session Note  Patient Details  Name: Terri Burns MRN: [id number] Date of Birth: 05-01-1951 Referring Provider:   Flowsheet Row Pulmonary Rehab from 05/11/2022 in [facility] Cardiac and Pulmonary Rehab  Referring Provider Dr. Farrel Gordon, MD       Encounter Date: 06/08/2022  Check In:  Session Check In - 06/08/22 1605       Check-In   Supervising physician immediately available to respond to emergencies See telemetry face sheet for immediately available ER MD    Location [facility]-Cardiac & Pulmonary Rehab    Staff Present Renita Papa, RN BSN;Joseph Gleneagle, Ernestina Patches, RN, [place]    Virtual Visit No    Medication changes reported     No    Fall or balance concerns reported    No    Tobacco Cessation No Change    Current number of cigarettes/nicotine per day     6    Warm-up and Cool-down Performed on first and last piece of equipment    Resistance Training Performed Yes    VAD Patient? No    PAD/SET Patient? No      Pain Assessment   Currently in Pain? No/denies                Social History   Tobacco Use  Smoking Status Every Day   Packs/day: 0.25   Years: 50.00   Total pack years: 12.50   Types: Cigarettes   Start date: 12/10/1973  Smokeless Tobacco Never  Tobacco Comments   1-2 ciggs per day - 04/13/2021; has a hx of 2 PPD x 10 years     Goals Met:  Independence with exercise equipment Exercise tolerated well No report of concerns or symptoms today Strength training completed today  Goals Unmet:  Not Applicable  Comments: Pt able to follow exercise prescription today without complaint.  Will continue to monitor for progression.    Dr. Emily Filbert is Medical Director for [place].  Dr. Ottie Glazier is Medical Director for [facility] Pulmonary Rehabilitation.

## 2022-06-09 ENCOUNTER — Encounter: Payer: 59 | Admitting: *Deleted

## 2022-06-09 DIAGNOSIS — J449 Chronic obstructive pulmonary disease, unspecified: Secondary | ICD-10-CM | POA: Diagnosis not present

## 2022-06-09 NOTE — Progress Notes (Signed)
Daily Session Note  Patient Details  Name: Terri Burns MRN: [id number] Date of Birth: [birth year] Referring Provider:   Flowsheet Row Pulmonary Rehab from 05/11/2022 in [facility] Cardiac and Pulmonary Rehab  Referring Provider Dr. Farrel Gordon, MD       Encounter Date: 06/09/2022  Check In:  Session Check In - 06/09/22 1606       Check-In   Supervising physician immediately available to respond to emergencies See telemetry face sheet for immediately available ER MD    Location [facility]-Cardiac & Pulmonary Rehab    Staff Present Renita Papa, RN BSN;Joseph [place], RCP,RRT,BSRT;Noah [place], [place], Exercise Physiologist    Virtual Visit No    Medication changes reported     No    Fall or balance concerns reported    No    Tobacco Cessation Use Decreased    Current number of cigarettes/nicotine per day     5    Warm-up and Cool-down Performed on first and last piece of equipment    Resistance Training Performed Yes    VAD Patient? No    PAD/SET Patient? No      Pain Assessment   Currently in Pain? No/denies                Social History   Tobacco Use  Smoking Status Every Day   Packs/day: 0.25   Years: 50.00   Total pack years: 12.50   Types: Cigarettes   Start date: 12/10/1973  Smokeless Tobacco Never  Tobacco Comments   1-2 ciggs per day - 04/13/2021; has a hx of 2 PPD x 10 years     Goals Met:  Independence with exercise equipment Exercise tolerated well No report of concerns or symptoms today Strength training completed today  Goals Unmet:  Not Applicable  Comments: Pt able to follow exercise prescription today without complaint.  Will continue to monitor for progression.    Dr. Emily Filbert is Medical Director for [place].  Dr. Ottie Glazier is Medical Director for [facility] Pulmonary Rehabilitation.

## 2022-06-21 ENCOUNTER — Telehealth: Payer: Self-pay

## 2022-06-21 DIAGNOSIS — J449 Chronic obstructive pulmonary disease, unspecified: Secondary | ICD-10-CM

## 2022-06-21 NOTE — Telephone Encounter (Signed)
We called Terri Burns to check in with her as she has not attended rehab since 06/09/2022. She reports not feeling well last week, but she is feeling better. We reminded Terri Burns to call and let us know if she is going to miss any of her scheduled sessions. She stated that she plans to return to pulmonary rehab on 06/23/2022.

## 2022-06-23 ENCOUNTER — Encounter: Payer: 59 | Admitting: *Deleted

## 2022-06-23 DIAGNOSIS — J449 Chronic obstructive pulmonary disease, unspecified: Secondary | ICD-10-CM

## 2022-06-23 NOTE — Progress Notes (Signed)
Daily Session Note  Patient Details  Name: Terri Burns MRN: [id number] Date of Birth: April 22, 1951 Referring Provider:   Flowsheet Row Pulmonary Rehab from 05/11/2022 in [facility] Cardiac and Pulmonary Rehab  Referring Provider Dr. Farrel Gordon, MD       Encounter Date: 06/23/2022  Check In:  Session Check In - 06/23/22 1609       Check-In   Supervising physician immediately available to respond to emergencies See telemetry face sheet for immediately available ER MD    Location [facility]-Cardiac & Pulmonary Rehab    Staff Present Renita Papa, RN BSN;Joseph Gratton, RCP,RRT,BSRT;Noah [place], [place], Exercise Physiologist    Virtual Visit No    Medication changes reported     No    Fall or balance concerns reported    No    Tobacco Cessation Use Decreased    Current number of cigarettes/nicotine per day     3    Warm-up and Cool-down Performed on first and last piece of equipment    Resistance Training Performed Yes    VAD Patient? No    PAD/SET Patient? No      Pain Assessment   Currently in Pain? No/denies                Social History   Tobacco Use  Smoking Status Every Day   Packs/day: 0.25   Years: 50.00   Total pack years: 12.50   Types: Cigarettes   Start date: 12/10/1973  Smokeless Tobacco Never  Tobacco Comments   1-2 ciggs per day - 04/13/2021; has a hx of 2 PPD x 10 years     Goals Met:  Independence with exercise equipment Exercise tolerated well No report of concerns or symptoms today Strength training completed today  Goals Unmet:  Not Applicable  Comments: Pt able to follow exercise prescription today without complaint.  Will continue to monitor for progression.    Dr. Emily Filbert is Medical Director for [facility].  Dr. Ottie Glazier is Medical Director for [facility] Pulmonary Rehabilitation.

## 2022-06-29 ENCOUNTER — Encounter: Payer: 59 | Admitting: *Deleted

## 2022-06-29 ENCOUNTER — Encounter: Payer: Self-pay | Admitting: *Deleted

## 2022-06-29 DIAGNOSIS — J449 Chronic obstructive pulmonary disease, unspecified: Secondary | ICD-10-CM | POA: Diagnosis not present

## 2022-06-29 NOTE — Progress Notes (Signed)
Daily Session Note  Patient Details  Name: Terri Burns MRN: [id number] Date of Birth: 1951/01/09 Referring Provider:   Flowsheet Row Pulmonary Rehab from 05/11/2022 in [facility] Cardiac and Pulmonary Rehab  Referring Provider Dr. Farrel Gordon, MD       Encounter Date: 06/29/2022  Check In:  Session Check In - 06/29/22 1559       Check-In   Supervising physician immediately available to respond to emergencies See telemetry face sheet for immediately available ER MD    Location [facility]-Cardiac & Pulmonary Rehab    Staff Present Renita Papa, RN BSN;Joseph Tessie Fass, Ernestina Patches, RN, [place]    Virtual Visit No    Medication changes reported     No    Fall or balance concerns reported    No    Tobacco Cessation Use Increase    Current number of cigarettes/nicotine per day     5    Warm-up and Cool-down Performed on first and last piece of equipment    Resistance Training Performed Yes    VAD Patient? No    PAD/SET Patient? No      Pain Assessment   Currently in Pain? No/denies                Social History   Tobacco Use  Smoking Status Every Day   Packs/day: 0.25   Years: 50.00   Total pack years: 12.50   Types: Cigarettes   Start date: 12/10/1973  Smokeless Tobacco Never  Tobacco Comments   1-2 ciggs per day - 04/13/2021; has a hx of 2 PPD x 10 years     Goals Met:  Independence with exercise equipment Exercise tolerated well No report of concerns or symptoms today Strength training completed today  Goals Unmet:  Not Applicable  Comments: Pt able to follow exercise prescription today without complaint.  Will continue to monitor for progression.    Dr. Emily Filbert is Medical Director for [facility].  Dr. Ottie Glazier is Medical Director for [facility] Pulmonary Rehabilitation.

## 2022-06-29 NOTE — Progress Notes (Signed)
Pulmonary Individual Treatment Plan  Patient Details  Name: Terri Burns MRN: [id number] Date of Birth: 1950/12/09 Referring Provider:   Flowsheet Row Pulmonary Rehab from 05/11/2022 in [facility] Cardiac and Pulmonary Rehab  Referring Provider Dr. Farrel Gordon, MD       Initial Encounter Date:  Flowsheet Row Pulmonary Rehab from 05/11/2022 in [facility] Cardiac and Pulmonary Rehab  Date 05/11/22       Visit Diagnosis: Chronic obstructive pulmonary disease, unspecified COPD type ([place])  Patient's Home Medications on Admission:  Current Outpatient Medications:    ACCU-CHEK GUIDE test strip, USE AS DIRECTED TO Check blood glucose, Disp: , Rfl:    Accu-Chek Softclix Lancets lancets, as directed. (Patient not taking: Reported on 05/05/2022), Disp: , Rfl:    albuterol (PROVENTIL) (2.5 MG/3ML) 0.083% nebulizer solution, Take 3 mLs (2.5 mg total) by nebulization 4 (four) times daily., Disp: 75 mL, Rfl: 12   ALPRAZolam (XANAX) 0.25 MG tablet, Take 0.25 mg by mouth 2 (two) times daily as needed., Disp: , Rfl:    amLODipine (NORVASC) 10 MG tablet, Take 1 tablet (10 mg total) by mouth daily., Disp: 30 tablet, Rfl: 11   aspirin 81 MG EC tablet, TAKE 1 TABLET BY MOUTH EVERY DAY, Disp: 90 tablet, Rfl: 0   atorvastatin (LIPITOR) 20 MG tablet, Take 20 mg by mouth every morning. (Patient not taking: Reported on 05/05/2022), Disp: , Rfl:    atorvastatin (LIPITOR) 40 MG tablet, Take 2 tablets (80 mg total) by mouth daily., Disp: 180 tablet, Rfl: 1   atorvastatin (LIPITOR) 80 MG tablet, , Disp: , Rfl:    azithromycin (ZITHROMAX) 250 MG tablet, Take 250 mg by mouth daily., Disp: , Rfl:    Blood Glucose Monitoring Suppl (ACCU-CHEK GUIDE) w/Device KIT, USE AS DIRECTED TO Check blood glucose, Disp: , Rfl:    BRILINTA 90 MG TABS tablet, TAKE ONE TABLET BY MOUTH EVERY MORNING and TAKE ONE TABLET BY MOUTH EVERYDAY AT BEDTIME, Disp: 60 tablet, Rfl: 0   citalopram (CELEXA) 20 MG tablet, TAKE 1 AND 1/2 TABLETS BY  MOUTH EVERY EVENING, Disp: 90 tablet, Rfl: 0   fluticasone (FLONASE) 50 MCG/ACT nasal spray, PLACE TWO SPRAYS into BOTH nostrils DAILY, Disp: 48 g, Rfl: 1   Fluticasone-Umeclidin-Vilant (TRELEGY ELLIPTA) 100-62.5-25 MCG/ACT AEPB, Inhale 1 puff into the lungs daily., Disp: 1 each, Rfl: 11   ipratropium-albuterol (DUONEB) 0.5-2.5 (3) MG/3ML SOLN, Take by nebulization., Disp: , Rfl:    isosorbide mononitrate (IMDUR) 30 MG 24 hr tablet, TAKE 1/2 TABLET BY MOUTH ONCE DAILY, Disp: 15 tablet, Rfl: 0   loratadine (ALLERGY RELIEF) 10 MG tablet, Take 1 tablet (10 mg total) by mouth daily., Disp: 90 tablet, Rfl: 1   losartan (COZAAR) 100 MG tablet, Take 1 tablet (100 mg total) by mouth every evening., Disp: 90 tablet, Rfl: 1   metoprolol succinate (TOPROL-XL) 25 MG 24 hr tablet, Take 1 tablet (25 mg total) by mouth every evening., Disp: 90 tablet, Rfl: 1   NARCAN 4 MG/0.1ML LIQD nasal spray kit, 1 spray as needed. As needed in case of overdose (Patient not taking: Reported on 05/05/2022), Disp: , Rfl:    pantoprazole (PROTONIX) 40 MG tablet, Take 1 tablet (40 mg total) by mouth every evening., Disp: 90 tablet, Rfl: 1   predniSONE (DELTASONE) 10 MG tablet, 60 mg tomorrow, taper by 10 mg daily until complete (Patient not taking: Reported on 05/05/2022), Disp: 21 tablet, Rfl: 0   predniSONE (DELTASONE) 20 MG tablet, Take 20 mg by mouth 2 (two)  times daily. 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    Hyperlipidemia    Hypertension    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    Tobacco Use: Social History   Tobacco Use  Smoking Status Every Day   Packs/day: 0.25   Years: 50.00   Total pack years: 12.50   Types: Cigarettes   Start date: 12/10/1973  Smokeless Tobacco Never  Tobacco Comments   1-2 ciggs per day - 04/13/2021; has a hx of 2 PPD x 10 years     Labs: Review Flowsheet  More data exists      Latest Ref Rng & Units 02/21/2020 02/02/2021 02/19/2021 02/25/2021 01/05/2022  Labs for ITP Cardiac and Pulmonary Rehab  Cholestrol <200 mg/dL 173  - - - -  LDL (calc) mg/dL (calc) 61  - - - -  Direct LDL 0 - 99 mg/dL - - - 45.5  -  HDL-C > OR = 50 mg/dL 101  - - - -  Trlycerides <150 mg/dL 39  - - - -  PH, Arterial 7.350 - 7.450 - 7.36  7.24  - - -  PCO2 arterial 32.0 - 48.0 mmHg - 44  60  - - -  Bicarbonate 20.0 - 28.0 mmol/L - 24.9  25.7  22.7  - 26.2   Acid-base deficit 0.0 - 2.0 mmol/L - 0.8  2.7  3.6  - 2.8   O2 Saturation % - 97.6  99.7  88.0  - 86.1      Pulmonary Assessment Scores:  Pulmonary Assessment Scores     Row Name 05/11/22 1542         ADL UCSD   ADL Phase Entry     SOB Score total 48     Rest 0     Walk 1     Stairs 5     Bath 2     Dress 2     Shop 3       CAT Score   CAT Score 17       mMRC Score   mMRC Score 1              UCSD: Self-administered rating of dyspnea associated with activities of daily living (ADLs) 6-point scale (0 = "not at all" to 5 = "maximal or unable to do because of breathlessness")  Scoring Scores range from 0 to 120.  Minimally important difference is 5 units  CAT: CAT can identify the health impairment of COPD patients and is better correlated with disease progression.  CAT has a scoring range of zero to 40. The CAT score is classified into four groups of low (less than 10), medium (10 - 20), high (21-30) and very high  (31-40) based on the impact level of disease on health status. A CAT score over 10 suggests  significant symptoms.  A worsening CAT score could be explained by an exacerbation, poor medication adherence, poor inhaler technique, or progression of COPD or comorbid conditions.  CAT MCID is 2 points  mMRC: mMRC (Modified Medical Research Council) Dyspnea Scale is used to assess the degree of baseline functional disability in patients of respiratory disease due to dyspnea. No minimal important difference is established. A decrease in score of 1 point or greater is considered a positive change.   Pulmonary Function Assessment:  Pulmonary Function Assessment - 05/05/22 1247       Breath   Shortness of Breath Yes;Limiting activity;Panic with Shortness of Breath             Exercise Target Goals: Exercise Program Goal: Individual exercise prescription set using results from initial 6 min walk test and THRR while considering  patient's activity barriers and safety.   Exercise Prescription Goal: Initial exercise prescription builds to 30-45 minutes a day of aerobic activity, 2-3 days per week.  Home exercise guidelines will be given to patient during program as part of exercise prescription that the participant will acknowledge.  Education: Aerobic Exercise: - Group verbal and visual presentation on the components of exercise prescription. Introduces F.I.T.T principle from ACSM for exercise prescriptions.  Reviews F.I.T.T. principles of aerobic exercise including progression. Written material given at graduation. Flowsheet Row Pulmonary Rehab from 05/11/2022 in [facility] Cardiac and Pulmonary Rehab  Education need identified 05/11/22       Education: Resistance Exercise: - Group verbal and visual presentation on the components of exercise prescription. Introduces F.I.T.T principle from ACSM for exercise prescriptions  Reviews F.I.T.T. principles of resistance exercise including progression. Written  material given at graduation.    Education: Exercise & Equipment Safety: - Individual verbal instruction and demonstration of equipment use and safety with use of the equipment. Flowsheet Row Pulmonary Rehab from 05/11/2022 in [facility] Cardiac and Pulmonary Rehab  Date 05/05/22  Educator [facility]  Instruction Review Code 1- Verbalizes Understanding       Education: Exercise Physiology & General Exercise Guidelines: - Group verbal and written instruction with models to review the exercise physiology of the cardiovascular system and associated critical values. Provides general exercise guidelines with specific guidelines to those with heart or lung disease.    Education: Flexibility, Balance, Mind/Body Relaxation: - Group verbal and visual presentation with interactive activity on the components of exercise prescription. Introduces F.I.T.T principle from ACSM for exercise prescriptions. Reviews F.I.T.T. principles of flexibility and balance exercise training including progression. Also discusses the mind body connection.  Reviews various relaxation techniques to help reduce and manage stress (i.e. Deep breathing, progressive muscle relaxation, and visualization). Balance handout provided to take home. Written material given at graduation.   Activity Barriers & Risk Stratification:  Activity Barriers & Cardiac Risk Stratification - 05/11/22 1551       Activity Barriers & Cardiac Risk Stratification   Activity Barriers Neck/Spine Problems;Deconditioning;Muscular Weakness;Shortness of Breath             6 Minute Walk:  6 Minute Walk     Row Name 05/11/22 1548         6 Minute Walk   Phase Initial     Distance 655 feet     Walk Time 5.85 minutes     # of Rest Breaks 1     MPH 1.27     METS 1.63     RPE 11     Perceived Dyspnea  1     VO2  Peak 5.72     Symptoms Yes (comment)     Comments Neck/Shoulder pain (5/10)     Resting HR 71 bpm     Resting BP 128/76     Resting Oxygen  Saturation  98 %     Exercise Oxygen Saturation  during 6 min walk 89 %     Max Ex. HR 91 bpm     Max Ex. BP 148/88     2 Minute Post BP 136/82       Interval HR   1 Minute HR 80     2 Minute HR 85     3 Minute HR 87     4 Minute HR 89     5 Minute HR 91     6 Minute HR 88     2 Minute Post HR 79     Interval Heart Rate? Yes       Interval Oxygen   Interval Oxygen? Yes     Baseline Oxygen Saturation % 98 %     1 Minute Oxygen Saturation % 92 %     1 Minute Liters of Oxygen 0 L  RA     2 Minute Oxygen Saturation % 92 %     2 Minute Liters of Oxygen 0 L  RA     3 Minute Oxygen Saturation % 92 %     3 Minute Liters of Oxygen 0 L  RA     4 Minute Oxygen Saturation % 90 %     4 Minute Liters of Oxygen 0 L  RA     5 Minute Oxygen Saturation % 90 %     5 Minute Liters of Oxygen 0 L  RA     6 Minute Oxygen Saturation % 89 %     6 Minute Liters of Oxygen 0 L  RA     2 Minute Post Oxygen Saturation % 97 %     2 Minute Post Liters of Oxygen 0 L  RA             Oxygen Initial Assessment:  Oxygen Initial Assessment - 05/05/22 1247       Home Oxygen   Home Oxygen Device None    Sleep Oxygen Prescription None    Home Exercise Oxygen Prescription None    Home Resting Oxygen Prescription None      Initial 6 min Walk   Oxygen Used None      Program Oxygen Prescription   Program Oxygen Prescription None      Intervention   Short Term Goals To learn and exhibit compliance with exercise, home and travel O2 prescription;To learn and understand importance of maintaining oxygen saturations>88%;To learn and demonstrate proper use of respiratory medications;To learn and demonstrate proper pursed lip breathing techniques or other breathing techniques. ;To learn and understand importance of monitoring SPO2 with pulse oximeter and demonstrate accurate use of the pulse oximeter.    Long  Term Goals Exhibits compliance with exercise, home  and travel O2 prescription;Maintenance of O2  saturations>88%;Compliance with respiratory medication;Demonstrates proper use of MDI's;Exhibits proper breathing techniques, such as pursed lip breathing or other method taught during program session;Verbalizes importance of monitoring SPO2 with pulse oximeter and return demonstration             Oxygen Re-Evaluation:  Oxygen Re-Evaluation     Row Name 05/18/22 1614 06/23/22 1612           Program Oxygen Prescription   Program  Oxygen Prescription -- None        Home Oxygen   Home Oxygen Device -- None      Sleep Oxygen Prescription -- None      Home Exercise Oxygen Prescription -- None      Home Resting Oxygen Prescription -- None        Goals/Expected Outcomes   Short Term Goals To learn and exhibit compliance with exercise, home and travel O2 prescription;To learn and understand importance of maintaining oxygen saturations>88%;To learn and demonstrate proper use of respiratory medications;To learn and demonstrate proper pursed lip breathing techniques or other breathing techniques. ;To learn and understand importance of monitoring SPO2 with pulse oximeter and demonstrate accurate use of the pulse oximeter. To learn and understand importance of maintaining oxygen saturations>88%;To learn and understand importance of monitoring SPO2 with pulse oximeter and demonstrate accurate use of the pulse oximeter.      Long  Term Goals Exhibits compliance with exercise, home  and travel O2 prescription;Maintenance of O2 saturations>88%;Compliance with respiratory medication;Demonstrates proper use of MDI's;Exhibits proper breathing techniques, such as pursed lip breathing or other method taught during program session;Verbalizes importance of monitoring SPO2 with pulse oximeter and return demonstration Maintenance of O2 saturations>88%      Comments Reviewed PLB technique with pt.  Talked about how it works and it's importance in maintaining their exercise saturations. Deklynn has been able to  practice PLB at home. She is doing well in the program despite her mobility.She has a pulse oximeter to check her/his oxygen saturation at home. Informed and explained why it is important to have one. Reviewed that oxygen saturations should be 88 percent and above. Patient verbalizes understanding.      Goals/Expected Outcomes Short: Become more profiecient at using PLB.   Long: Become independent at using PLB. Short: monitor oxygen at home with exertion. Long: maintain oxygen saturations above 88 percent independently.               Oxygen Discharge (Final Oxygen Re-Evaluation):  Oxygen Re-Evaluation - 06/23/22 1612       Program Oxygen Prescription   Program Oxygen Prescription None      Home Oxygen   Home Oxygen Device None    Sleep Oxygen Prescription None    Home Exercise Oxygen Prescription None    Home Resting Oxygen Prescription None      Goals/Expected Outcomes   Short Term Goals To learn and understand importance of maintaining oxygen saturations>88%;To learn and understand importance of monitoring SPO2 with pulse oximeter and demonstrate accurate use of the pulse oximeter.    Long  Term Goals Maintenance of O2 saturations>88%    Comments Tieesha has been able to practice PLB at home. She is doing well in the program despite her mobility.She has a pulse oximeter to check her/his oxygen saturation at home. Informed and explained why it is important to have one. Reviewed that oxygen saturations should be 88 percent and above. Patient verbalizes understanding.    Goals/Expected Outcomes Short: monitor oxygen at home with exertion. Long: maintain oxygen saturations above 88 percent independently.             Initial Exercise Prescription:  Initial Exercise Prescription - 05/11/22 1600       Date of Initial Exercise RX and Referring Provider   Date 05/11/22    Referring Provider Dr. Farrel Gordon, MD      Oxygen   Oxygen Continuous    Maintain Oxygen Saturation  88% or higher  Recumbant Bike   Level 1    RPM 50    Watts 10    Minutes 15    METs 1.63      NuStep   Level 1    SPM 80    Minutes 15    METs 1.63      Biostep-RELP   Level 1    SPM 60    Minutes 15    METs 1.63      Track   Laps 15    Minutes 15    METs 1.82      Prescription Details   Frequency (times per week) 2    Duration Progress to 30 minutes of continuous aerobic without signs/symptoms of physical distress      Intensity   THRR 40-80% of Max Heartrate 102-134    Ratings of Perceived Exertion 11-13    Perceived Dyspnea 0-4      Progression   Progression Continue to progress workloads to maintain intensity without signs/symptoms of physical distress.      Resistance Training   Training Prescription Yes    Weight 2 lb    Reps 10-15             Perform Capillary Blood Glucose checks as needed.  Exercise Prescription Changes:   Exercise Prescription Changes     Row Name 05/11/22 1600 05/31/22 1400 06/13/22 1300 06/28/22 1300       Response to Exercise   Blood Pressure (Admit) 128/76 124/72 122/78 126/64    Blood Pressure (Exercise) 148/88 140/86 138/62 122/62    Blood Pressure (Exit) 136/82 110/60 118/70 122/70    Heart Rate (Admit) 71 bpm 79 bpm 82 bpm 80 bpm    Heart Rate (Exercise) 91 bpm 101 bpm 91 bpm 91 bpm    Heart Rate (Exit) 79 bpm 81 bpm 79 bpm 73 bpm    Oxygen Saturation (Admit) 98 % 97 % 96 % 94 %    Oxygen Saturation (Exercise) 89 % 90 % 89 % 91 %    Oxygen Saturation (Exit) 97 % 96 % 95 % 95 %    Rating of Perceived Exertion (Exercise) '11 15 13 12    '$ Perceived Dyspnea (Exercise) '1 2 1 '$ 0    Symptoms Neck/shoulder pain (5/10) -- SOB, fatigue, hip pain none    Comments 6MWT Results First three sessions in rehab -- --    Duration -- Progress to 30 minutes of  aerobic without signs/symptoms of physical distress Progress to 30 minutes of  aerobic without signs/symptoms of physical distress Progress to 30 minutes of  aerobic  without signs/symptoms of physical distress    Intensity -- THRR unchanged THRR unchanged THRR unchanged      Progression   Progression -- Continue to progress workloads to maintain intensity without signs/symptoms of physical distress. Continue to progress workloads to maintain intensity without signs/symptoms of physical distress. Continue to progress workloads to maintain intensity without signs/symptoms of physical distress.    Average METs -- 1.69 1.88 2      Resistance Training   Training Prescription -- Yes Yes Yes    Weight -- 2 lb 2 lb 2 lb    Reps -- 10-15 10-15 10-15      Interval Training   Interval Training -- No No No      Recumbant Bike   Level -- -- 1 --    Watts -- -- 10 --    Minutes -- -- 15 --  METs -- -- 2.53 --      NuStep   Level -- '3 3 2    '$ Minutes -- '15 15 15    '$ METs -- 1.'9 2 2      '$ Biostep-RELP   Level -- '1 1 1    '$ Minutes -- '15 15 15    '$ METs -- '1 2 2      '$ Track   Laps -- 17 -- --    Minutes -- 15 -- --    METs -- 1.92 -- --      Oxygen   Maintain Oxygen Saturation -- 88% or higher 88% or higher 88% or higher             Exercise Comments:   Exercise Comments     Row Name 05/18/22 1613           Exercise Comments First full day of exercise!  Patient was oriented to gym and equipment including functions, settings, policies, and procedures.  Patient's individual exercise prescription and treatment plan were reviewed.  All starting workloads were established based on the results of the 6 minute walk test done at initial orientation visit.  The plan for exercise progression was also introduced and progression will be customized based on patient's performance and goals.                Exercise Goals and Review:   Exercise Goals     Row Name 05/11/22 1551             Exercise Goals   Increase Physical Activity Yes       Intervention Provide advice, education, support and counseling about physical activity/exercise  needs.;Develop an individualized exercise prescription for aerobic and resistive training based on initial evaluation findings, risk stratification, comorbidities and participant's personal goals.       Expected Outcomes Short Term: Attend rehab on a regular basis to increase amount of physical activity.;Long Term: Exercising regularly at least 3-5 days a week.;Long Term: Add in home exercise to make exercise part of routine and to increase amount of physical activity.       Increase Strength and Stamina Yes       Intervention Provide advice, education, support and counseling about physical activity/exercise needs.;Develop an individualized exercise prescription for aerobic and resistive training based on initial evaluation findings, risk stratification, comorbidities and participant's personal goals.       Expected Outcomes Short Term: Increase workloads from initial exercise prescription for resistance, speed, and METs.;Short Term: Perform resistance training exercises routinely during rehab and add in resistance training at home;Long Term: Improve cardiorespiratory fitness, muscular endurance and strength as measured by increased METs and functional capacity (6MWT)       Able to understand and use rate of perceived exertion (RPE) scale Yes       Intervention Provide education and explanation on how to use RPE scale       Expected Outcomes Short Term: Able to use RPE daily in rehab to express subjective intensity level;Long Term:  Able to use RPE to guide intensity level when exercising independently       Able to understand and use Dyspnea scale Yes       Intervention Provide education and explanation on how to use Dyspnea scale       Expected Outcomes Short Term: Able to use Dyspnea scale daily in rehab to express subjective sense of shortness of breath during exertion;Long Term: Able to use Dyspnea scale to guide intensity  level when exercising independently       Knowledge and understanding of  Target Heart Rate Range (THRR) Yes       Intervention Provide education and explanation of THRR including how the numbers were predicted and where they are located for reference       Expected Outcomes Long Term: Able to use THRR to govern intensity when exercising independently;Short Term: Able to state/look up THRR;Short Term: Able to use daily as guideline for intensity in rehab       Able to check pulse independently Yes       Intervention Provide education and demonstration on how to check pulse in carotid and radial arteries.;Review the importance of being able to check your own pulse for safety during independent exercise       Expected Outcomes Short Term: Able to explain why pulse checking is important during independent exercise;Long Term: Able to check pulse independently and accurately       Understanding of Exercise Prescription Yes       Intervention Provide education, explanation, and written materials on patient's individual exercise prescription       Expected Outcomes Short Term: Able to explain program exercise prescription;Long Term: Able to explain home exercise prescription to exercise independently                Exercise Goals Re-Evaluation :  Exercise Goals Re-Evaluation     Row Name 05/18/22 1613 05/31/22 1502 06/13/22 1336 06/28/22 1331       Exercise Goal Re-Evaluation   Exercise Goals Review Increase Physical Activity;Able to understand and use rate of perceived exertion (RPE) scale;Understanding of Exercise Prescription;Knowledge and understanding of Target Heart Rate Range (THRR);Increase Strength and Stamina;Able to check pulse independently Increase Physical Activity;Increase Strength and Stamina;Understanding of Exercise Prescription Increase Physical Activity;Increase Strength and Stamina;Understanding of Exercise Prescription Increase Physical Activity;Increase Strength and Stamina;Understanding of Exercise Prescription    Comments Reviewed RPE scale, THR  and program prescription with pt today.  Pt voiced understanding and was given a copy of goals to take home. Mylissa is off to a good start in the program. She had an average MET level of 1.69 METs during her first three sessions. She also was able to improve to level 3 on the T4. She consistently walked 17 laps on the track as well. We will continue to monitor her progress in the program. Emmelia is doing well in rehab. She is limited with her walking and some seated machines due to hip pain.  She has stayed consistent at level 3 on the T4 Nustep but would benefit from increasing to level 2 on the Biostep. She is not quite hitting her THR but may to better if she is able to walk more. We will continue to monitor. Cherly has only attended rehab once the last review due to being out sick. She is still limited on her walking, but has done well on the T4 and biostep and has consistantly worked at 2 METs. We will encourage her to increase her workloads on the seated machines. We will continue to monitor her progress in the program.    Expected Outcomes Short: Use RPE daily to regulate intensity.  Long: Follow program prescription in THR. Short: Continue to follow current exercise prescription. Long: Continue to improve strength and stamina. Short: Add more walking as tolerated with hip pain Long: Continue to increase overall MET level Short: Return to regular attendance in rehab, and increase workloads on seated machines. Long: Continue to  improve strength and stamina.             Discharge Exercise Prescription (Final Exercise Prescription Changes):  Exercise Prescription Changes - 06/28/22 1300       Response to Exercise   Blood Pressure (Admit) 126/64    Blood Pressure (Exercise) 122/62    Blood Pressure (Exit) 122/70    Heart Rate (Admit) 80 bpm    Heart Rate (Exercise) 91 bpm    Heart Rate (Exit) 73 bpm    Oxygen Saturation (Admit) 94 %    Oxygen Saturation (Exercise) 91 %    Oxygen Saturation  (Exit) 95 %    Rating of Perceived Exertion (Exercise) 12    Perceived Dyspnea (Exercise) 0    Symptoms none    Duration Progress to 30 minutes of  aerobic without signs/symptoms of physical distress    Intensity THRR unchanged      Progression   Progression Continue to progress workloads to maintain intensity without signs/symptoms of physical distress.    Average METs 2      Resistance Training   Training Prescription Yes    Weight 2 lb    Reps 10-15      Interval Training   Interval Training No      NuStep   Level 2    Minutes 15    METs 2      Biostep-RELP   Level 1    Minutes 15    METs 2      Oxygen   Maintain Oxygen Saturation 88% or higher             Nutrition:  Target Goals: Understanding of nutrition guidelines, daily intake of sodium '1500mg'$ , cholesterol '200mg'$ , calories 30% from fat and 7% or less from saturated fats, daily to have 5 or more servings of fruits and vegetables.  Education: All About Nutrition: -Group instruction provided by verbal, written material, interactive activities, discussions, models, and posters to present general guidelines for heart healthy nutrition including fat, fiber, MyPlate, the role of sodium in heart healthy nutrition, utilization of the nutrition label, and utilization of this knowledge for meal planning. Follow up email sent as well. Written material given at graduation. Flowsheet Row Pulmonary Rehab from 05/11/2022 in [facility]  Education need identified 05/11/22       Biometrics:  Pre Biometrics - 05/11/22 1552       Pre Biometrics   Height 4' 10.5" (1.486 m)    Weight 132 lb 14.4 oz (60.3 kg)    Waist Circumference 36.5 inches    Hip Circumference 40.5 inches    Waist to Hip Ratio 0.9 %    BMI (Calculated) 27.3    Single Leg Stand 1.5 seconds   L             Nutrition Therapy Plan and Nutrition Goals:  Nutrition Therapy & Goals - 05/11/22 1505       Nutrition Therapy    Diet Heart healthy, low Na, pulmonary MNT.    Drug/Food Interactions Statins/Certain Fruits    Protein (specify units) 60-70g    Fiber 25 grams    Whole Grain Foods 3 servings    Saturated Fats 12 max. grams    Fruits and Vegetables 5 servings/day    Sodium 2 grams      Personal Nutrition Goals   Nutrition Goal ST: eat a protein source at each meal/snack, consider including ensure into diet LT: meet protein/energy needs, limit saturated fat <12g/day,  limit sodium <2g/day    Comments 72 y.o F admitted to pulmonary rehab for COPD. PMHx includes CAD, HFpEF, HTN, HLD, tobacco use. Relevant medications lipitor, clelexa, trelegy, protonix, prednisone, tramadol, xanax. B: egg and sometimes grits L: fruit cup and some cheese - sometimes also tomatoes D: fish or chicken with cabbage (stemaed or slaw), tomatoes, with a slice of bread (white bread) Drinks: water and coffee (black). She reads food labels, uses Mrs Deliah Boston seasoning, and general eats whole or minimally processed foods. Rochell reports hvaing issues with hypoglycemia - no hx of diabetes. Discussed heart healthy eating, pulmonary MNT, and nutrition for maintaining a stable BG. Asyia wanted to review other protein sources besides meat as well - discussed other options as well as protein needs - she reports having ensure in the past.      Intervention Plan   Intervention Prescribe, educate and counsel regarding individualized specific dietary modifications aiming towards targeted core components such as weight, hypertension, lipid management, diabetes, heart failure and other comorbidities.;Nutrition handout(s) given to patient.    Expected Outcomes Short Term Goal: A plan has been developed with personal nutrition goals set during dietitian appointment.;Long Term Goal: Adherence to prescribed nutrition plan.;Short Term Goal: Understand basic principles of dietary content, such as calories, fat, sodium, cholesterol and nutrients.              Nutrition Assessments:  MEDIFICTS Score Key: ?70 Need to make dietary changes  40-70 Heart Healthy Diet ? 40 Therapeutic Level Cholesterol Diet  Flowsheet Row Pulmonary Rehab from 05/11/2022 in [facility] Cardiac and Pulmonary Rehab  Picture Your Plate Total Score on Admission 70      Picture Your Plate Scores: <51 Unhealthy dietary pattern with much room for improvement. 41-50 Dietary pattern unlikely to meet recommendations for good health and room for improvement. 51-60 More healthful dietary pattern, with some room for improvement.  >60 Healthy dietary pattern, although there may be some specific behaviors that could be improved.   Nutrition Goals Re-Evaluation:  Nutrition Goals Re-Evaluation     [place] Name 06/23/22 1616             Goals   Nutrition Goal Watch portion sizes       Comment Patient was informed on why it is important to maintain a balanced diet when dealing with Respiratory issues. Explained that it takes a lot of energy to breath and when they are short of breath often they will need to have a good diet to help keep up with the calories they are expending for breathing.       Expected Outcome Short: Choose and plan snacks accordingly to patients caloric intake to improve breathing. Long: Maintain a diet independently that meets their caloric intake to aid in daily shortness of breath.                Nutrition Goals Discharge (Final Nutrition Goals Re-Evaluation):  Nutrition Goals Re-Evaluation - 06/23/22 1616       Goals   Nutrition Goal Watch portion sizes    Comment Patient was informed on why it is important to maintain a balanced diet when dealing with Respiratory issues. Explained that it takes a lot of energy to breath and when they are short of breath often they will need to have a good diet to help keep up with the calories they are expending for breathing.    Expected Outcome Short: Choose and plan snacks accordingly to patients caloric intake to  improve breathing. Long: Maintain  a diet independently that meets their caloric intake to aid in daily shortness of breath.             Psychosocial: Target Goals: Acknowledge presence or absence of significant depression and/or stress, maximize coping skills, provide positive support system. Participant is able to verbalize types and ability to use techniques and skills needed for reducing stress and depression.   Education: Stress, Anxiety, and Depression - Group verbal and visual presentation to define topics covered.  Reviews how body is impacted by stress, anxiety, and depression.  Also discusses healthy ways to reduce stress and to treat/manage anxiety and depression.  Written material given at graduation.   Education: Sleep Hygiene -Provides group verbal and written instruction about how sleep can affect your health.  Define sleep hygiene, discuss sleep cycles and impact of sleep habits. Review good sleep hygiene tips.    Initial Review & Psychosocial Screening:  Initial Psych Review & Screening - 05/05/22 1249       Initial Review   Current issues with None Identified      Family Dynamics   Good Support System? Yes    Comments Alorah can look to her two sons and three grandsons for support. Her friends and familiy come over often and help her through hard times.      Barriers   Psychosocial barriers to participate in program The patient should benefit from training in stress management and relaxation.      Screening Interventions   Interventions Encouraged to exercise;To provide support and resources with identified psychosocial needs;Provide feedback about the scores to participant    Expected Outcomes Short Term goal: Utilizing psychosocial counselor, staff and physician to assist with identification of specific Stressors or current issues interfering with healing process. Setting desired goal for each stressor or current issue identified.;Long Term Goal: Stressors or  current issues are controlled or eliminated.;Short Term goal: Identification and review with participant of any Quality of Life or Depression concerns found by scoring the questionnaire.;Long Term goal: The participant improves quality of Life and PHQ9 Scores as seen by post scores and/or verbalization of changes             Quality of Life Scores:  Scores of 19 and below usually indicate a poorer quality of life in these areas.  A difference of  2-3 points is a clinically meaningful difference.  A difference of 2-3 points in the total score of the Quality of Life Index has been associated with significant improvement in overall quality of life, self-image, physical symptoms, and general health in studies assessing change in quality of life.  PHQ-9: Review Flowsheet  More data exists      05/11/2022 10/05/2021 07/13/2021 04/02/2021 03/30/2021  Depression screen PHQ 2/9  Decreased Interest 0 0 0 0 0 0  Down, Depressed, Hopeless 0 0 0 0 0 0  PHQ - 2 Score 0 0 0 0 0 0  Altered sleeping 0 0 0 1 0  Tired, decreased energy 1 0 0 1 0  Change in appetite 0 0 0 1 0  Feeling bad or failure about yourself  0 0 0 0 0  Trouble concentrating 0 0 0 0 0  Moving slowly or fidgety/restless 0 0 0 0 0  Suicidal thoughts 0 0 0 0 0  PHQ-9 Score 1 0 0 3 0  Difficult doing work/chores Somewhat difficult Not difficult at all - Not difficult at all Not difficult at all   Interpretation of Total Score  Total Score Depression Severity:  1-4 = Minimal depression, 5-9 = Mild depression, 10-14 = Moderate depression, 15-19 = Moderately severe depression, 20-27 = Severe depression   Psychosocial Evaluation and Intervention:  Psychosocial Evaluation - 05/05/22 1251       Psychosocial Evaluation & Interventions   Interventions Encouraged to exercise with the program and follow exercise prescription;Relaxation education;Stress management education    Comments Magdaline can look to her two sons and three grandsons for  support. Her friends and familiy come over often and help her through hard times.    Expected Outcomes Short: Start [facility] to help with mood. Long: Maintain a healthy mental state.    Continue Psychosocial Services  Follow up required by staff             Psychosocial Re-Evaluation:  Psychosocial Re-Evaluation     [place] Name 06/23/22 1617             Psychosocial Re-Evaluation   Current issues with None Identified       Comments Patient reports no issues with their current mental states, sleep, stress, depression or anxiety. Will follow up with patient in a few weeks for any changes.       Expected Outcomes Short: Continue to exercise regularly to support mental health and notify staff of any changes. Long: maintain mental health and well being through teaching of rehab or prescribed medications independently.       Interventions Encouraged to attend Pulmonary Rehabilitation for the exercise       Continue Psychosocial Services  Follow up required by staff                Psychosocial Discharge (Final Psychosocial Re-Evaluation):  Psychosocial Re-Evaluation - 06/23/22 1617       Psychosocial Re-Evaluation   Current issues with None Identified    Comments Patient reports no issues with their current mental states, sleep, stress, depression or anxiety. Will follow up with patient in a few weeks for any changes.    Expected Outcomes Short: Continue to exercise regularly to support mental health and notify staff of any changes. Long: maintain mental health and well being through teaching of rehab or prescribed medications independently.    Interventions Encouraged to attend Pulmonary Rehabilitation for the exercise    Continue Psychosocial Services  Follow up required by staff             Education: Education Goals: Education classes will be provided on a weekly basis, covering required topics. Participant will state understanding/return demonstration of topics  presented.  Learning Barriers/Preferences:  Learning Barriers/Preferences - 05/05/22 1248       Learning Barriers/Preferences   Learning Barriers None    Learning Preferences None             General Pulmonary Education Topics:  Infection Prevention: - Provides verbal and written material to individual with discussion of infection control including proper hand washing and proper equipment cleaning during exercise session. Flowsheet Row Pulmonary Rehab from 05/11/2022 in [facility] Cardiac and Pulmonary Rehab  Date 05/05/22  Educator [facility]  Instruction Review Code 1- Verbalizes Understanding       Falls Prevention: - Provides verbal and written material to individual with discussion of falls prevention and safety. Flowsheet Row Pulmonary Rehab from 05/11/2022 in [facility] Cardiac and Pulmonary Rehab  Date 05/05/22  Educator [facility]  Instruction Review Code 1- Verbalizes Understanding       Chronic Lung Disease Review: - Group verbal instruction with posters, models, PowerPoint presentations  and videos,  to review new updates, new respiratory medications, new advancements in procedures and treatments. Providing information on websites and "800" numbers for continued self-education. Includes information about supplement oxygen, available portable oxygen systems, continuous and intermittent flow rates, oxygen safety, concentrators, and Medicare reimbursement for oxygen. Explanation of Pulmonary Drugs, including class, frequency, complications, importance of spacers, rinsing mouth after steroid MDI's, and proper cleaning methods for nebulizers. Review of basic lung anatomy and physiology related to function, structure, and complications of lung disease. Review of risk factors. Discussion about methods for diagnosing sleep apnea and types of masks and machines for OSA. Includes a review of the use of types of environmental controls: home humidity, furnaces, filters, dust mite/pet prevention, HEPA  vacuums. Discussion about weather changes, air quality and the benefits of nasal washing. Instruction on Warning signs, infection symptoms, calling MD promptly, preventive modes, and value of vaccinations. Review of effective airway clearance, coughing and/or vibration techniques. Emphasizing that all should Create an Action Plan. Written material given at graduation.   AED/CPR: - Group verbal and written instruction with the use of models to demonstrate the basic use of the AED with the basic ABC's of resuscitation.    Anatomy and Cardiac Procedures: - Group verbal and visual presentation and models provide information about basic cardiac anatomy and function. Reviews the testing methods done to diagnose heart disease and the outcomes of the test results. Describes the treatment choices: Medical Management, Angioplasty, or Coronary Bypass Surgery for treating various heart conditions including Myocardial Infarction, Angina, Valve Disease, and Cardiac Arrhythmias.  Written material given at graduation.   Medication Safety: - Group verbal and visual instruction to review commonly prescribed medications for heart and lung disease. Reviews the medication, class of the drug, and side effects. Includes the steps to properly store meds and maintain the prescription regimen.  Written material given at graduation.   Other: -Provides group and verbal instruction on various topics (see comments)   Knowledge Questionnaire Score:  Knowledge Questionnaire Score - 05/11/22 1530       Knowledge Questionnaire Score   Pre Score 14/18              Core Components/Risk Factors/Patient Goals at Admission:  Personal Goals and Risk Factors at Admission - 05/11/22 1541       Core Components/Risk Factors/Patient Goals on Admission    Weight Management Yes;Weight Loss    Intervention Weight Management: Develop a combined nutrition and exercise program designed to reach desired caloric intake, while  maintaining appropriate intake of nutrient and fiber, sodium and fats, and appropriate energy expenditure required for the weight goal.;Weight Management: Provide education and appropriate resources to help participant work on and attain dietary goals.;Weight Management/Obesity: Establish reasonable short term and long term weight goals.    Admit Weight 132 lb 14.4 oz (60.3 kg)    Goal Weight: Short Term 130 lb (59 kg)    Goal Weight: Long Term 125 lb (56.7 kg)    Expected Outcomes Short Term: Continue to assess and modify interventions until short term weight is achieved;Long Term: Adherence to nutrition and physical activity/exercise program aimed toward attainment of established weight goal;Weight Loss: Understanding of general recommendations for a balanced deficit meal plan, which promotes 1-2 lb weight loss per week and includes a negative energy balance of [phone] kcal/d;Understanding recommendations for meals to include 15-35% energy as protein, 25-35% energy from fat, 35-60% energy from carbohydrates, less than '200mg'$  of dietary cholesterol, 20-35 gm of total fiber daily;Understanding of distribution  of calorie intake throughout the day with the consumption of 4-5 meals/snacks    Tobacco Cessation Yes    Number of packs per day 1/4    Intervention Assist the participant in steps to quit. Provide individualized education and counseling about committing to Tobacco Cessation, relapse prevention, and pharmacological support that can be provided by physician.;Advice worker, assist with locating and accessing local/national Quit Smoking programs, and support quit date choice.    Expected Outcomes Short Term: Will quit all tobacco product use, adhering to prevention of relapse plan.;Short Term: Will demonstrate readiness to quit, by selecting a quit date.;Long Term: Complete abstinence from all tobacco products for at least 12 months from quit date.    Improve shortness of breath with  ADL's Yes    Intervention Provide education, individualized exercise plan and daily activity instruction to help decrease symptoms of SOB with activities of daily living.    Expected Outcomes Short Term: Improve cardiorespiratory fitness to achieve a reduction of symptoms when performing ADLs;Long Term: Be able to perform more ADLs without symptoms or delay the onset of symptoms    Hypertension Yes    Intervention Provide education on lifestyle modifcations including regular physical activity/exercise, weight management, moderate sodium restriction and increased consumption of fresh fruit, vegetables, and low fat dairy, alcohol moderation, and smoking cessation.;Monitor prescription use compliance.    Expected Outcomes Short Term: Continued assessment and intervention until BP is < 140/83m HG in hypertensive participants. < 130/855mHG in hypertensive participants with diabetes, heart failure or chronic kidney disease.;Long Term: Maintenance of blood pressure at goal levels.    Lipids Yes    Intervention Provide education and support for participant on nutrition & aerobic/resistive exercise along with prescribed medications to achieve LDL '70mg'$ , HDL >'40mg'$ .    Expected Outcomes Short Term: Participant states understanding of desired cholesterol values and is compliant with medications prescribed. Participant is following exercise prescription and nutrition guidelines.;Long Term: Cholesterol controlled with medications as prescribed, with individualized exercise RX and with personalized nutrition plan. Value goals: LDL < '70mg'$ , HDL > 40 mg.             Education:Diabetes - Individual verbal and written instruction to review signs/symptoms of diabetes, desired ranges of glucose level fasting, after meals and with exercise. Acknowledge that pre and post exercise glucose checks will be done for 3 sessions at entry of program.   Know Your Numbers and Heart Failure: - Group verbal and visual  instruction to discuss disease risk factors for cardiac and pulmonary disease and treatment options.  Reviews associated critical values for Overweight/Obesity, Hypertension, Cholesterol, and Diabetes.  Discusses basics of heart failure: signs/symptoms and treatments.  Introduces Heart Failure Zone chart for action plan for heart failure.  Written material given at graduation.   Core Components/Risk Factors/Patient Goals Review:   Goals and Risk Factor Review     Row Name 06/23/22 1615             Core Components/Risk Factors/Patient Goals Review   Personal Goals Review Improve shortness of breath with ADL's       Review Spoke to patient about their shortness of breath and what they can do to improve. Patient has been informed of breathing techniques when starting the program. Patient is informed to tell staff if they have had any med changes and that certain meds they are taking or not taking can be causing shortness of breath.       Expected Outcomes Short: Attend [facility] regularly to  improve shortness of breath with ADL's. Long: maintain independence with ADL's                Core Components/Risk Factors/Patient Goals at Discharge (Final Review):   Goals and Risk Factor Review - 06/23/22 1615       Core Components/Risk Factors/Patient Goals Review   Personal Goals Review Improve shortness of breath with ADL's    Review Spoke to patient about their shortness of breath and what they can do to improve. Patient has been informed of breathing techniques when starting the program. Patient is informed to tell staff if they have had any med changes and that certain meds they are taking or not taking can be causing shortness of breath.    Expected Outcomes Short: Attend [facility] regularly to improve shortness of breath with ADL's. Long: maintain independence with ADL's             ITP Comments:  ITP Comments     Row Name 05/05/22 1246 05/11/22 1523 05/18/22 1613 06/01/22 1052  06/21/22 1458   ITP Comments Virtual Visit completed. Patient informed on EP and RD appointment and 6 Minute walk test. Patient also informed of patient health questionnaires on My Chart. Patient Verbalizes understanding. Visit diagnosis can be found in [facility] 01/05/2022. Completed 6MWT and gym orientation. Initial ITP created and sent for review to Dr. Ottie Glazier, Medical Director. First full day of exercise!  Patient was oriented to gym and equipment including functions, settings, policies, and procedures.  Patient's individual exercise prescription and treatment plan were reviewed.  All starting workloads were established based on the results of the 6 minute walk test done at initial orientation visit.  The plan for exercise progression was also introduced and progression will be customized based on patient's performance and goals. 30 Day review completed. Medical Director ITP review done, changes made as directed, and signed approval by Medical Director.    new to program We called Kenlie to check in with her as she has not attended rehab since 06/09/2022. She reports not feeling well last week, but she is feeling better. We reminded Bertrice to call and let us know if she is going to miss any of her scheduled sessions. She stated that she plans to return to pulmonary rehab on 06/23/2022.    [place] Name 06/29/22 1018           ITP Comments 30 Day review completed. Medical Director ITP review done, changes made as directed, and signed approval by Medical Director.                Comments:

## 2022-06-30 ENCOUNTER — Encounter: Payer: 59 | Admitting: *Deleted

## 2022-06-30 DIAGNOSIS — J449 Chronic obstructive pulmonary disease, unspecified: Secondary | ICD-10-CM | POA: Diagnosis not present

## 2022-06-30 NOTE — Progress Notes (Signed)
Daily Session Note  Patient Details  Name: Terri Burns MRN: [id number] Date of Birth: [birth year] Referring Provider:   Flowsheet Row Pulmonary Rehab from 05/11/2022 in [facility]  Referring Provider Dr. Farrel Gordon, MD       Encounter Date: 06/30/2022  Check In:  Session Check In - 06/30/22 1606       Check-In   Supervising physician immediately available to respond to emergencies See telemetry face sheet for immediately available ER MD    Location [facility]-Cardiac & Pulmonary Rehab    Staff Present Renita Papa, RN BSN;Joseph Lacey, RCP,RRT,BSRT;Noah Dalton, [place], Exercise Physiologist    Virtual Visit No    Medication changes reported     No    Fall or balance concerns reported    No    Tobacco Cessation Use Decreased    Current number of cigarettes/nicotine per day     4    Warm-up and Cool-down Performed on first and last piece of equipment    Resistance Training Performed Yes    VAD Patient? No    PAD/SET Patient? No      Pain Assessment   Currently in Pain? No/denies                Social History   Tobacco Use  Smoking Status Every Day   Packs/day: 0.25   Years: 50.00   Total pack years: 12.50   Types: Cigarettes   Start date: 12/10/1973  Smokeless Tobacco Never  Tobacco Comments   1-2 ciggs per day - 04/13/2021; has a hx of 2 PPD x 10 years     Goals Met:  Independence with exercise equipment Exercise tolerated well No report of concerns or symptoms today Strength training completed today  Goals Unmet:  Not Applicable  Comments: Pt able to follow exercise prescription today without complaint.  Will continue to monitor for progression.    Dr. Emily Filbert is Medical Director for [place].  Dr. Ottie Glazier is Medical Director for [facility] Pulmonary Rehabilitation.

## 2022-07-14 ENCOUNTER — Telehealth: Payer: Self-pay

## 2022-07-14 ENCOUNTER — Ambulatory Visit: Payer: Medicare Other

## 2022-07-14 NOTE — Telephone Encounter (Signed)
I called pt as she did not show for AWV in office. I received no answer. Will r/s when pt calls back.B.Tahiry Spicer,LPN

## 2022-07-18 ENCOUNTER — Telehealth: Payer: Self-pay

## 2022-07-18 NOTE — Telephone Encounter (Signed)
Called patient to check in on pulmonary rehab. Patient has not attended since 1/25. Patient states she has had appts and also recently had a fall. Denies being injured but feels a little sore. Feels good enough to exercise. Encouraged her to start using a walker when she is here and if she has one at home to start using that as well. She is due to come back on 2/14 and patient will call us if anything changes at that time.

## 2022-07-20 ENCOUNTER — Encounter: Payer: 59 | Attending: Internal Medicine | Admitting: *Deleted

## 2022-07-20 DIAGNOSIS — J449 Chronic obstructive pulmonary disease, unspecified: Secondary | ICD-10-CM | POA: Insufficient documentation

## 2022-07-27 ENCOUNTER — Encounter: Payer: Self-pay | Admitting: *Deleted

## 2022-07-27 DIAGNOSIS — J449 Chronic obstructive pulmonary disease, unspecified: Secondary | ICD-10-CM

## 2022-07-27 NOTE — Progress Notes (Signed)
Pulmonary Individual Treatment Plan  Patient Details  Name: Terri Burns MRN: [id number] Date of Birth: 1950/12/09 Referring Provider:   Flowsheet Row Pulmonary Rehab from 05/11/2022 in [facility] Cardiac and Pulmonary Rehab  Referring Provider Dr. Farrel Gordon, MD       Initial Encounter Date:  Flowsheet Row Pulmonary Rehab from 05/11/2022 in [facility] Cardiac and Pulmonary Rehab  Date 05/11/22       Visit Diagnosis: Chronic obstructive pulmonary disease, unspecified COPD type ([place])  Patient's Home Medications on Admission:  Current Outpatient Medications:    ACCU-CHEK GUIDE test strip, USE AS DIRECTED TO Check blood glucose, Disp: , Rfl:    Accu-Chek Softclix Lancets lancets, as directed. (Patient not taking: Reported on 05/05/2022), Disp: , Rfl:    albuterol (PROVENTIL) (2.5 MG/3ML) 0.083% nebulizer solution, Take 3 mLs (2.5 mg total) by nebulization 4 (four) times daily., Disp: 75 mL, Rfl: 12   ALPRAZolam (XANAX) 0.25 MG tablet, Take 0.25 mg by mouth 2 (two) times daily as needed., Disp: , Rfl:    amLODipine (NORVASC) 10 MG tablet, Take 1 tablet (10 mg total) by mouth daily., Disp: 30 tablet, Rfl: 11   aspirin 81 MG EC tablet, TAKE 1 TABLET BY MOUTH EVERY DAY, Disp: 90 tablet, Rfl: 0   atorvastatin (LIPITOR) 20 MG tablet, Take 20 mg by mouth every morning. (Patient not taking: Reported on 05/05/2022), Disp: , Rfl:    atorvastatin (LIPITOR) 40 MG tablet, Take 2 tablets (80 mg total) by mouth daily., Disp: 180 tablet, Rfl: 1   atorvastatin (LIPITOR) 80 MG tablet, , Disp: , Rfl:    azithromycin (ZITHROMAX) 250 MG tablet, Take 250 mg by mouth daily., Disp: , Rfl:    Blood Glucose Monitoring Suppl (ACCU-CHEK GUIDE) w/Device KIT, USE AS DIRECTED TO Check blood glucose, Disp: , Rfl:    BRILINTA 90 MG TABS tablet, TAKE ONE TABLET BY MOUTH EVERY MORNING and TAKE ONE TABLET BY MOUTH EVERYDAY AT BEDTIME, Disp: 60 tablet, Rfl: 0   citalopram (CELEXA) 20 MG tablet, TAKE 1 AND 1/2 TABLETS BY  MOUTH EVERY EVENING, Disp: 90 tablet, Rfl: 0   fluticasone (FLONASE) 50 MCG/ACT nasal spray, PLACE TWO SPRAYS into BOTH nostrils DAILY, Disp: 48 g, Rfl: 1   Fluticasone-Umeclidin-Vilant (TRELEGY ELLIPTA) 100-62.5-25 MCG/ACT AEPB, Inhale 1 puff into the lungs daily., Disp: 1 each, Rfl: 11   ipratropium-albuterol (DUONEB) 0.5-2.5 (3) MG/3ML SOLN, Take by nebulization., Disp: , Rfl:    isosorbide mononitrate (IMDUR) 30 MG 24 hr tablet, TAKE 1/2 TABLET BY MOUTH ONCE DAILY, Disp: 15 tablet, Rfl: 0   loratadine (ALLERGY RELIEF) 10 MG tablet, Take 1 tablet (10 mg total) by mouth daily., Disp: 90 tablet, Rfl: 1   losartan (COZAAR) 100 MG tablet, Take 1 tablet (100 mg total) by mouth every evening., Disp: 90 tablet, Rfl: 1   metoprolol succinate (TOPROL-XL) 25 MG 24 hr tablet, Take 1 tablet (25 mg total) by mouth every evening., Disp: 90 tablet, Rfl: 1   NARCAN 4 MG/0.1ML LIQD nasal spray kit, 1 spray as needed. As needed in case of overdose (Patient not taking: Reported on 05/05/2022), Disp: , Rfl:    pantoprazole (PROTONIX) 40 MG tablet, Take 1 tablet (40 mg total) by mouth every evening., Disp: 90 tablet, Rfl: 1   predniSONE (DELTASONE) 10 MG tablet, 60 mg tomorrow, taper by 10 mg daily until complete (Patient not taking: Reported on 05/05/2022), Disp: 21 tablet, Rfl: 0   predniSONE (DELTASONE) 20 MG tablet, Take 20 mg by mouth 2 (two)  times daily. 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    Hyperlipidemia    Hypertension    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    Tobacco Use: Social History   Tobacco Use  Smoking Status Every Day   Packs/day: 0.25   Years: 50.00   Total pack years: 12.50   Types: Cigarettes   Start date: 12/10/1973  Smokeless Tobacco Never  Tobacco Comments   1-2 ciggs per day - 04/13/2021; has a hx of 2 PPD x 10 years     Labs: Review Flowsheet  More data exists      Latest Ref Rng & Units 02/21/2020 02/02/2021 02/19/2021 02/25/2021 01/05/2022  Labs for ITP Cardiac and Pulmonary Rehab  Cholestrol <200 mg/dL 173  - - - -  LDL (calc) mg/dL (calc) 61  - - - -  Direct LDL 0 - 99 mg/dL - - - 45.5  -  HDL-C > OR = 50 mg/dL 101  - - - -  Trlycerides <150 mg/dL 39  - - - -  PH, Arterial 7.350 - 7.450 - 7.36  7.24  - - -  PCO2 arterial 32.0 - 48.0 mmHg - 44  60  - - -  Bicarbonate 20.0 - 28.0 mmol/L - 24.9  25.7  22.7  - 26.2   Acid-base deficit 0.0 - 2.0 mmol/L - 0.8  2.7  3.6  - 2.8   O2 Saturation % - 97.6  99.7  88.0  - 86.1      Pulmonary Assessment Scores:  Pulmonary Assessment Scores     Row Name 05/11/22 1542         ADL UCSD   ADL Phase Entry     SOB Score total 48     Rest 0     Walk 1     Stairs 5     Bath 2     Dress 2     Shop 3       CAT Score   CAT Score 17       mMRC Score   mMRC Score 1              UCSD: Self-administered rating of dyspnea associated with activities of daily living (ADLs) 6-point scale (0 = "not at all" to 5 = "maximal or unable to do because of breathlessness")  Scoring Scores range from 0 to 120.  Minimally important difference is 5 units  CAT: CAT can identify the health impairment of COPD patients and is better correlated with disease progression.  CAT has a scoring range of zero to 40. The CAT score is classified into four groups of low (less than 10), medium (10 - 20), high (21-30) and very high  (31-40) based on the impact level of disease on health status. A CAT score over 10 suggests  significant symptoms.  A worsening CAT score could be explained by an exacerbation, poor medication adherence, poor inhaler technique, or progression of COPD or comorbid conditions.  CAT MCID is 2 points  mMRC: mMRC (Modified Medical Research Council) Dyspnea Scale is used to assess the degree of baseline functional disability in patients of respiratory disease due to dyspnea. No minimal important difference is established. A decrease in score of 1 point or greater is considered a positive change.   Pulmonary Function Assessment:  Pulmonary Function Assessment - 05/05/22 1247       Breath   Shortness of Breath Yes;Limiting activity;Panic with Shortness of Breath             Exercise Target Goals: Exercise Program Goal: Individual exercise prescription set using results from initial 6 min walk test and THRR while considering  patient's activity barriers and safety.   Exercise Prescription Goal: Initial exercise prescription builds to 30-45 minutes a day of aerobic activity, 2-3 days per week.  Home exercise guidelines will be given to patient during program as part of exercise prescription that the participant will acknowledge.  Education: Aerobic Exercise: - Group verbal and visual presentation on the components of exercise prescription. Introduces F.I.T.T principle from ACSM for exercise prescriptions.  Reviews F.I.T.T. principles of aerobic exercise including progression. Written material given at graduation. Flowsheet Row Pulmonary Rehab from 05/11/2022 in [facility] Cardiac and Pulmonary Rehab  Education need identified 05/11/22       Education: Resistance Exercise: - Group verbal and visual presentation on the components of exercise prescription. Introduces F.I.T.T principle from ACSM for exercise prescriptions  Reviews F.I.T.T. principles of resistance exercise including progression. Written  material given at graduation.    Education: Exercise & Equipment Safety: - Individual verbal instruction and demonstration of equipment use and safety with use of the equipment. Flowsheet Row Pulmonary Rehab from 05/11/2022 in [facility] Cardiac and Pulmonary Rehab  Date 05/05/22  Educator [facility]  Instruction Review Code 1- Verbalizes Understanding       Education: Exercise Physiology & General Exercise Guidelines: - Group verbal and written instruction with models to review the exercise physiology of the cardiovascular system and associated critical values. Provides general exercise guidelines with specific guidelines to those with heart or lung disease.    Education: Flexibility, Balance, Mind/Body Relaxation: - Group verbal and visual presentation with interactive activity on the components of exercise prescription. Introduces F.I.T.T principle from ACSM for exercise prescriptions. Reviews F.I.T.T. principles of flexibility and balance exercise training including progression. Also discusses the mind body connection.  Reviews various relaxation techniques to help reduce and manage stress (i.e. Deep breathing, progressive muscle relaxation, and visualization). Balance handout provided to take home. Written material given at graduation.   Activity Barriers & Risk Stratification:  Activity Barriers & Cardiac Risk Stratification - 05/11/22 1551       Activity Barriers & Cardiac Risk Stratification   Activity Barriers Neck/Spine Problems;Deconditioning;Muscular Weakness;Shortness of Breath             6 Minute Walk:  6 Minute Walk     Row Name 05/11/22 1548         6 Minute Walk   Phase Initial     Distance 655 feet     Walk Time 5.85 minutes     # of Rest Breaks 1     MPH 1.27     METS 1.63     RPE 11     Perceived Dyspnea  1     VO2  Peak 5.72     Symptoms Yes (comment)     Comments Neck/Shoulder pain (5/10)     Resting HR 71 bpm     Resting BP 128/76     Resting Oxygen  Saturation  98 %     Exercise Oxygen Saturation  during 6 min walk 89 %     Max Ex. HR 91 bpm     Max Ex. BP 148/88     2 Minute Post BP 136/82       Interval HR   1 Minute HR 80     2 Minute HR 85     3 Minute HR 87     4 Minute HR 89     5 Minute HR 91     6 Minute HR 88     2 Minute Post HR 79     Interval Heart Rate? Yes       Interval Oxygen   Interval Oxygen? Yes     Baseline Oxygen Saturation % 98 %     1 Minute Oxygen Saturation % 92 %     1 Minute Liters of Oxygen 0 L  RA     2 Minute Oxygen Saturation % 92 %     2 Minute Liters of Oxygen 0 L  RA     3 Minute Oxygen Saturation % 92 %     3 Minute Liters of Oxygen 0 L  RA     4 Minute Oxygen Saturation % 90 %     4 Minute Liters of Oxygen 0 L  RA     5 Minute Oxygen Saturation % 90 %     5 Minute Liters of Oxygen 0 L  RA     6 Minute Oxygen Saturation % 89 %     6 Minute Liters of Oxygen 0 L  RA     2 Minute Post Oxygen Saturation % 97 %     2 Minute Post Liters of Oxygen 0 L  RA             Oxygen Initial Assessment:  Oxygen Initial Assessment - 05/05/22 1247       Home Oxygen   Home Oxygen Device None    Sleep Oxygen Prescription None    Home Exercise Oxygen Prescription None    Home Resting Oxygen Prescription None      Initial 6 min Walk   Oxygen Used None      Program Oxygen Prescription   Program Oxygen Prescription None      Intervention   Short Term Goals To learn and exhibit compliance with exercise, home and travel O2 prescription;To learn and understand importance of maintaining oxygen saturations>88%;To learn and demonstrate proper use of respiratory medications;To learn and demonstrate proper pursed lip breathing techniques or other breathing techniques. ;To learn and understand importance of monitoring SPO2 with pulse oximeter and demonstrate accurate use of the pulse oximeter.    Long  Term Goals Exhibits compliance with exercise, home  and travel O2 prescription;Maintenance of O2  saturations>88%;Compliance with respiratory medication;Demonstrates proper use of MDI's;Exhibits proper breathing techniques, such as pursed lip breathing or other method taught during program session;Verbalizes importance of monitoring SPO2 with pulse oximeter and return demonstration             Oxygen Re-Evaluation:  Oxygen Re-Evaluation     Row Name 05/18/22 1614 06/23/22 1612           Program Oxygen Prescription   Program  Oxygen Prescription -- None        Home Oxygen   Home Oxygen Device -- None      Sleep Oxygen Prescription -- None      Home Exercise Oxygen Prescription -- None      Home Resting Oxygen Prescription -- None        Goals/Expected Outcomes   Short Term Goals To learn and exhibit compliance with exercise, home and travel O2 prescription;To learn and understand importance of maintaining oxygen saturations>88%;To learn and demonstrate proper use of respiratory medications;To learn and demonstrate proper pursed lip breathing techniques or other breathing techniques. ;To learn and understand importance of monitoring SPO2 with pulse oximeter and demonstrate accurate use of the pulse oximeter. To learn and understand importance of maintaining oxygen saturations>88%;To learn and understand importance of monitoring SPO2 with pulse oximeter and demonstrate accurate use of the pulse oximeter.      Long  Term Goals Exhibits compliance with exercise, home  and travel O2 prescription;Maintenance of O2 saturations>88%;Compliance with respiratory medication;Demonstrates proper use of MDI's;Exhibits proper breathing techniques, such as pursed lip breathing or other method taught during program session;Verbalizes importance of monitoring SPO2 with pulse oximeter and return demonstration Maintenance of O2 saturations>88%      Comments Reviewed PLB technique with pt.  Talked about how it works and it's importance in maintaining their exercise saturations. Deklynn has been able to  practice PLB at home. She is doing well in the program despite her mobility.She has a pulse oximeter to check her/his oxygen saturation at home. Informed and explained why it is important to have one. Reviewed that oxygen saturations should be 88 percent and above. Patient verbalizes understanding.      Goals/Expected Outcomes Short: Become more profiecient at using PLB.   Long: Become independent at using PLB. Short: monitor oxygen at home with exertion. Long: maintain oxygen saturations above 88 percent independently.               Oxygen Discharge (Final Oxygen Re-Evaluation):  Oxygen Re-Evaluation - 06/23/22 1612       Program Oxygen Prescription   Program Oxygen Prescription None      Home Oxygen   Home Oxygen Device None    Sleep Oxygen Prescription None    Home Exercise Oxygen Prescription None    Home Resting Oxygen Prescription None      Goals/Expected Outcomes   Short Term Goals To learn and understand importance of maintaining oxygen saturations>88%;To learn and understand importance of monitoring SPO2 with pulse oximeter and demonstrate accurate use of the pulse oximeter.    Long  Term Goals Maintenance of O2 saturations>88%    Comments Tieesha has been able to practice PLB at home. She is doing well in the program despite her mobility.She has a pulse oximeter to check her/his oxygen saturation at home. Informed and explained why it is important to have one. Reviewed that oxygen saturations should be 88 percent and above. Patient verbalizes understanding.    Goals/Expected Outcomes Short: monitor oxygen at home with exertion. Long: maintain oxygen saturations above 88 percent independently.             Initial Exercise Prescription:  Initial Exercise Prescription - 05/11/22 1600       Date of Initial Exercise RX and Referring Provider   Date 05/11/22    Referring Provider Dr. Farrel Gordon, MD      Oxygen   Oxygen Continuous    Maintain Oxygen Saturation  88% or higher  Recumbant Bike   Level 1    RPM 50    Watts 10    Minutes 15    METs 1.63      NuStep   Level 1    SPM 80    Minutes 15    METs 1.63      Biostep-RELP   Level 1    SPM 60    Minutes 15    METs 1.63      Track   Laps 15    Minutes 15    METs 1.82      Prescription Details   Frequency (times per week) 2    Duration Progress to 30 minutes of continuous aerobic without signs/symptoms of physical distress      Intensity   THRR 40-80% of Max Heartrate 102-134    Ratings of Perceived Exertion 11-13    Perceived Dyspnea 0-4      Progression   Progression Continue to progress workloads to maintain intensity without signs/symptoms of physical distress.      Resistance Training   Training Prescription Yes    Weight 2 lb    Reps 10-15             Perform Capillary Blood Glucose checks as needed.  Exercise Prescription Changes:   Exercise Prescription Changes     Row Name 05/11/22 1600 05/31/22 1400 06/13/22 1300 06/28/22 1300 07/11/22 1500     Response to Exercise   Blood Pressure (Admit) 128/76 124/72 122/78 126/64 118/62   Blood Pressure (Exercise) 148/88 140/86 138/62 122/62 148/72   Blood Pressure (Exit) 136/82 110/60 118/70 122/70 128/64   Heart Rate (Admit) 71 bpm 79 bpm 82 bpm 80 bpm 77 bpm   Heart Rate (Exercise) 91 bpm 101 bpm 91 bpm 91 bpm 99 bpm   Heart Rate (Exit) 79 bpm 81 bpm 79 bpm 73 bpm 83 bpm   Oxygen Saturation (Admit) 98 % 97 % 96 % 94 % 94 %   Oxygen Saturation (Exercise) 89 % 90 % 89 % 91 % 93 %   Oxygen Saturation (Exit) 97 % 96 % 95 % 95 % 94 %   Rating of Perceived Exertion (Exercise) 11 15 13 12 15   $ Perceived Dyspnea (Exercise) 1 2 1 $ 0 2   Symptoms Neck/shoulder pain (5/10) -- SOB, fatigue, hip pain none SOB   Comments 6MWT Results First three sessions in rehab -- -- --   Duration -- Progress to 30 minutes of  aerobic without signs/symptoms of physical distress Progress to 30 minutes of  aerobic without  signs/symptoms of physical distress Progress to 30 minutes of  aerobic without signs/symptoms of physical distress Continue with 30 min of aerobic exercise without signs/symptoms of physical distress.   Intensity -- THRR unchanged THRR unchanged THRR unchanged THRR unchanged     Progression   Progression -- Continue to progress workloads to maintain intensity without signs/symptoms of physical distress. Continue to progress workloads to maintain intensity without signs/symptoms of physical distress. Continue to progress workloads to maintain intensity without signs/symptoms of physical distress. Continue to progress workloads to maintain intensity without signs/symptoms of physical distress.   Average METs -- 1.69 1.88 2 1.84     Resistance Training   Training Prescription -- Yes Yes Yes Yes   Weight -- 2 lb 2 lb 2 lb 2 lb   Reps -- 10-15 10-15 10-15 10-15     Interval Training   Interval Training -- No No No No  Recumbant Bike   Level -- -- 1 -- --   Watts -- -- 10 -- --   Minutes -- -- 15 -- --   METs -- -- 2.53 -- --     NuStep   Level -- 3 3 2 3   $ Minutes -- 15 15 15 15   $ METs -- 1.9 2 2 $ 1.8     Biostep-RELP   Level -- 1 1 1 $ --   Minutes -- 15 15 15 $ --   METs -- 1 2 2 $ --     Track   Laps -- 17 -- -- 17   Minutes -- 15 -- -- 15   METs -- 1.92 -- -- 1.92     Oxygen   Maintain Oxygen Saturation -- 88% or higher 88% or higher 88% or higher 88% or higher            Exercise Comments:   Exercise Comments     Row Name 05/18/22 1613           Exercise Comments First full day of exercise!  Patient was oriented to gym and equipment including functions, settings, policies, and procedures.  Patient's individual exercise prescription and treatment plan were reviewed.  All starting workloads were established based on the results of the 6 minute walk test done at initial orientation visit.  The plan for exercise progression was also introduced and progression will be  customized based on patient's performance and goals.                Exercise Goals and Review:   Exercise Goals     Row Name 05/11/22 1551             Exercise Goals   Increase Physical Activity Yes       Intervention Provide advice, education, support and counseling about physical activity/exercise needs.;Develop an individualized exercise prescription for aerobic and resistive training based on initial evaluation findings, risk stratification, comorbidities and participant's personal goals.       Expected Outcomes Short Term: Attend rehab on a regular basis to increase amount of physical activity.;Long Term: Exercising regularly at least 3-5 days a week.;Long Term: Add in home exercise to make exercise part of routine and to increase amount of physical activity.       Increase Strength and Stamina Yes       Intervention Provide advice, education, support and counseling about physical activity/exercise needs.;Develop an individualized exercise prescription for aerobic and resistive training based on initial evaluation findings, risk stratification, comorbidities and participant's personal goals.       Expected Outcomes Short Term: Increase workloads from initial exercise prescription for resistance, speed, and METs.;Short Term: Perform resistance training exercises routinely during rehab and add in resistance training at home;Long Term: Improve cardiorespiratory fitness, muscular endurance and strength as measured by increased METs and functional capacity (6MWT)       Able to understand and use rate of perceived exertion (RPE) scale Yes       Intervention Provide education and explanation on how to use RPE scale       Expected Outcomes Short Term: Able to use RPE daily in rehab to express subjective intensity level;Long Term:  Able to use RPE to guide intensity level when exercising independently       Able to understand and use Dyspnea scale Yes       Intervention Provide education and  explanation on how to use Dyspnea scale  Expected Outcomes Short Term: Able to use Dyspnea scale daily in rehab to express subjective sense of shortness of breath during exertion;Long Term: Able to use Dyspnea scale to guide intensity level when exercising independently       Knowledge and understanding of Target Heart Rate Range (THRR) Yes       Intervention Provide education and explanation of THRR including how the numbers were predicted and where they are located for reference       Expected Outcomes Long Term: Able to use THRR to govern intensity when exercising independently;Short Term: Able to state/look up THRR;Short Term: Able to use daily as guideline for intensity in rehab       Able to check pulse independently Yes       Intervention Provide education and demonstration on how to check pulse in carotid and radial arteries.;Review the importance of being able to check your own pulse for safety during independent exercise       Expected Outcomes Short Term: Able to explain why pulse checking is important during independent exercise;Long Term: Able to check pulse independently and accurately       Understanding of Exercise Prescription Yes       Intervention Provide education, explanation, and written materials on patient's individual exercise prescription       Expected Outcomes Short Term: Able to explain program exercise prescription;Long Term: Able to explain home exercise prescription to exercise independently                Exercise Goals Re-Evaluation :  Exercise Goals Re-Evaluation     Row Name 05/18/22 1613 05/31/22 1502 06/13/22 1336 06/28/22 1331 07/11/22 1516     Exercise Goal Re-Evaluation   Exercise Goals Review Increase Physical Activity;Able to understand and use rate of perceived exertion (RPE) scale;Understanding of Exercise Prescription;Knowledge and understanding of Target Heart Rate Range (THRR);Increase Strength and Stamina;Able to check pulse independently  Increase Physical Activity;Increase Strength and Stamina;Understanding of Exercise Prescription Increase Physical Activity;Increase Strength and Stamina;Understanding of Exercise Prescription Increase Physical Activity;Increase Strength and Stamina;Understanding of Exercise Prescription Increase Physical Activity;Increase Strength and Stamina;Understanding of Exercise Prescription   Comments Reviewed RPE scale, THR and program prescription with pt today.  Pt voiced understanding and was given a copy of goals to take home. Israella is off to a good start in the program. She had an average MET level of 1.69 METs during her first three sessions. She also was able to improve to level 3 on the T4. She consistently walked 17 laps on the track as well. We will continue to monitor her progress in the program. Aamani is doing well in rehab. She is limited with her walking and some seated machines due to hip pain.  She has stayed consistent at level 3 on the T4 Nustep but would benefit from increasing to level 2 on the Biostep. She is not quite hitting her THR but may to better if she is able to walk more. We will continue to monitor. Jericca has only attended rehab once the last review due to being out sick. She is still limited on her walking, but has done well on the T4 and biostep and has consistantly worked at 2 METs. We will encourage her to increase her workloads on the seated machines. We will continue to monitor her progress in the program. Jasara has been sporadic with attendance with rehab, she had completed 2 sessions since last review. Staff will review attendance policy with patient to make sure she  maintains good attendance to yield best results. She was able to walk a total of 17 laps  and work at level 3 on the T4 Nustep. She is not quite hitting her THR and will encourage an increase in workloads as well. We will continue to monitor   Expected Outcomes Short: Use RPE daily to regulate intensity.  Long: Follow  program prescription in THR. Short: Continue to follow current exercise prescription. Long: Continue to improve strength and stamina. Short: Add more walking as tolerated with hip pain Long: Continue to increase overall MET level Short: Return to regular attendance in rehab, and increase workloads on seated machines. Long: Continue to improve strength and stamina. Short: Maintain good attendance and increase laps on track Long: Continue to increase overall MET level and stamina    Row Name 07/27/22 1409             Exercise Goal Re-Evaluation   Exercise Goals Review Increase Physical Activity;Increase Strength and Stamina;Understanding of Exercise Prescription       Comments Quinnly has not attended rehab since the last review. She informed us that she has had some appointments that have conflicted with her rehab sessions. She also recently fell and has been sore. We will contact patient to see when she plans to return to the program. We will continue to monitor her progress once she returns to the program.       Expected Outcomes Short: Return to rehab. Long: Continue to improve strength and stamina.                Discharge Exercise Prescription (Final Exercise Prescription Changes):  Exercise Prescription Changes - 07/11/22 1500       Response to Exercise   Blood Pressure (Admit) 118/62    Blood Pressure (Exercise) 148/72    Blood Pressure (Exit) 128/64    Heart Rate (Admit) 77 bpm    Heart Rate (Exercise) 99 bpm    Heart Rate (Exit) 83 bpm    Oxygen Saturation (Admit) 94 %    Oxygen Saturation (Exercise) 93 %    Oxygen Saturation (Exit) 94 %    Rating of Perceived Exertion (Exercise) 15    Perceived Dyspnea (Exercise) 2    Symptoms SOB    Duration Continue with 30 min of aerobic exercise without signs/symptoms of physical distress.    Intensity THRR unchanged      Progression   Progression Continue to progress workloads to maintain intensity without signs/symptoms of  physical distress.    Average METs 1.84      Resistance Training   Training Prescription Yes    Weight 2 lb    Reps 10-15      Interval Training   Interval Training No      NuStep   Level 3    Minutes 15    METs 1.8      Track   Laps 17    Minutes 15    METs 1.92      Oxygen   Maintain Oxygen Saturation 88% or higher             Nutrition:  Target Goals: Understanding of nutrition guidelines, daily intake of sodium <1566m, cholesterol <2026m calories 30% from fat and 7% or less from saturated fats, daily to have 5 or more servings of fruits and vegetables.  Education: All About Nutrition: -Group instruction provided by verbal, written material, interactive activities, discussions, models, and posters to present general guidelines for heart healthy nutrition including fat,  fiber, MyPlate, the role of sodium in heart healthy nutrition, utilization of the nutrition label, and utilization of this knowledge for meal planning. Follow up email sent as well. Written material given at graduation. Flowsheet Row Pulmonary Rehab from 05/11/2022 in [facility] Card[facility] and Pulmonary Rehab  Education need identified 05/11/22       Biometrics:  Pre Biometrics - 05/11/22 1552       Pre Biometrics   Height 4' 10.5" (1.486 m)    Weight 132 lb 14.4 oz (60.3 kg)    Waist Circumference 36.5 inches    Hip Circumference 40.5 inches    Waist to Hip Ratio 0.9 %    BMI (Calculated) 27.3    Single Leg Stand 1.5 seconds   L             Nutrition Therapy Plan and Nutrition Goals:  Nutrition Therapy & Goals - 05/11/22 1505       Nutrition Therapy   Diet Heart healthy, low Na, pulmonary MNT.    Drug/Food Interactions Statins/Certain Fruits    Protein (specify units) 60-70g    Fiber 25 grams    Whole Grain Foods 3 servings    Saturated Fats 12 max. grams    Fruits and Vegetables 5 servings/day    Sodium 2 grams      Personal Nutrition Goals   Nutrition Goal ST: eat a protein  source at each meal/snack, consider including ensure into diet LT: meet protein/energy needs, limit saturated fat <12g/day, limit sodium <2g/day    Comments 72 y.o F admitted to pulmonary rehab for COPD. PMHx includes CAD, HFpEF, HTN, HLD, tobacco use. Relevant medications lipitor, clelexa, trelegy, protonix, prednisone, tramadol, xanax. B: egg and sometimes grits L: fruit cup and some cheese - sometimes also tomatoes D: fish or chicken with cabbage (stemaed or slaw), tomatoes, with a slice of bread (white bread) Drinks: water and coffee (black). She reads food labels, uses Mrs Deliah Boston seasoning, and general eats whole or minimally processed foods. Asly reports hvaing issues with hypoglycemia - no hx of diabetes. Discussed heart healthy eating, pulmonary MNT, and nutrition for maintaining a stable BG. Cena wanted to review other protein sources besides meat as well - discussed other options as well as protein needs - she reports having ensure in the past.      Intervention Plan   Intervention Prescribe, educate and counsel regarding individualized specific dietary modifications aiming towards targeted core components such as weight, hypertension, lipid management, diabetes, heart failure and other comorbidities.;Nutrition handout(s) given to patient.    Expected Outcomes Short Term Goal: A plan has been developed with personal nutrition goals set during dietitian appointment.;Long Term Goal: Adherence to prescribed nutrition plan.;Short Term Goal: Understand basic principles of dietary content, such as calories, fat, sodium, cholesterol and nutrients.             Nutrition Assessments:  MEDIFICTS Score Key: ?70 Need to make dietary changes  40-70 Heart Healthy Diet ? 40 Therapeutic Level Cholesterol Diet  Flowsheet Row Pulmonary Rehab from 05/11/2022 in [facility] Cardiac and Pulmonary Rehab  Picture Your Plate Total Score on Admission 70      Picture Your Plate Scores: [id number] Unhealthy dietary  pattern with much room for improvement. 41-50 Dietary pattern unlikely to meet recommendations for good health and room for improvement. 51-60 More healthful dietary pattern, with some room for improvement.  >60 Healthy dietary pattern, although there may be some specific behaviors that could be improved.   Nutrition Goals Re-Evaluation:  Nutrition Goals Re-Evaluation     [place] Name 06/23/22 1616             Goals   Nutrition Goal Watch portion sizes       Comment Patient was informed on why it is important to maintain a balanced diet when dealing with Respiratory issues. Explained that it takes a lot of energy to breath and when they are short of breath often they will need to have a good diet to help keep up with the calories they are expending for breathing.       Expected Outcome Short: Choose and plan snacks accordingly to patients caloric intake to improve breathing. Long: Maintain a diet independently that meets their caloric intake to aid in daily shortness of breath.                Nutrition Goals Discharge (Final Nutrition Goals Re-Evaluation):  Nutrition Goals Re-Evaluation - 06/23/22 1616       Goals   Nutrition Goal Watch portion sizes    Comment Patient was informed on why it is important to maintain a balanced diet when dealing with Respiratory issues. Explained that it takes a lot of energy to breath and when they are short of breath often they will need to have a good diet to help keep up with the calories they are expending for breathing.    Expected Outcome Short: Choose and plan snacks accordingly to patients caloric intake to improve breathing. Long: Maintain a diet independently that meets their caloric intake to aid in daily shortness of breath.             Psychosocial: Target Goals: Acknowledge presence or absence of significant depression and/or stress, maximize coping skills, provide positive support system. Participant is able to verbalize types and  ability to use techniques and skills needed for reducing stress and depression.   Education: Stress, Anxiety, and Depression - Group verbal and visual presentation to define topics covered.  Reviews how body is impacted by stress, anxiety, and depression.  Also discusses healthy ways to reduce stress and to treat/manage anxiety and depression.  Written material given at graduation.   Education: Sleep Hygiene -Provides group verbal and written instruction about how sleep can affect your health.  Define sleep hygiene, discuss sleep cycles and impact of sleep habits. Review good sleep hygiene tips.    Initial Review & Psychosocial Screening:  Initial Psych Review & Screening - 05/05/22 1249       Initial Review   Current issues with None Identified      Family Dynamics   Good Support System? Yes    Comments Marlea can look to her two sons and three grandsons for support. Her friends and familiy come over often and help her through hard times.      Barriers   Psychosocial barriers to participate in program The patient should benefit from training in stress management and relaxation.      Screening Interventions   Interventions Encouraged to exercise;To provide support and resources with identified psychosocial needs;Provide feedback about the scores to participant    Expected Outcomes Short Term goal: Utilizing psychosocial counselor, staff and physician to assist with identification of specific Stressors or current issues interfering with healing process. Setting desired goal for each stressor or current issue identified.;Long Term Goal: Stressors or current issues are controlled or eliminated.;Short Term goal: Identification and review with participant of any Quality of Life or Depression concerns found by scoring the questionnaire.;Long Term goal:  The participant improves quality of Life and PHQ9 Scores as seen by post scores and/or verbalization of changes             Quality of Life  Scores:  Scores of 19 and below usually indicate a poorer quality of life in these areas.  A difference of  2-3 points is a clinically meaningful difference.  A difference of 2-3 points in the total score of the Quality of Life Index has been associated with significant improvement in overall quality of life, self-image, physical symptoms, and general health in studies assessing change in quality of life.  PHQ-9: Review Flowsheet  More data exists      05/11/2022 10/05/2021 07/13/2021 04/02/2021 03/30/2021  Depression screen PHQ 2/9  Decreased Interest 0 0 0 0 0 0  Down, Depressed, Hopeless 0 0 0 0 0 0  PHQ - 2 Score 0 0 0 0 0 0  Altered sleeping 0 0 0 1 0  Tired, decreased energy 1 0 0 1 0  Change in appetite 0 0 0 1 0  Feeling bad or failure about yourself  0 0 0 0 0  Trouble concentrating 0 0 0 0 0  Moving slowly or fidgety/restless 0 0 0 0 0  Suicidal thoughts 0 0 0 0 0  PHQ-9 Score 1 0 0 3 0  Difficult doing work/chores Somewhat difficult Not difficult at all - Not difficult at all Not difficult at all   Interpretation of Total Score  Total Score Depression Severity:  1-4 = Minimal depression, 5-9 = Mild depression, 10-14 = Moderate depression, 15-19 = Moderately severe depression, 20-27 = Severe depression   Psychosocial Evaluation and Intervention:  Psychosocial Evaluation - 05/05/22 1251       Psychosocial Evaluation & Interventions   Interventions Encouraged to exercise with the program and follow exercise prescription;Relaxation education;Stress management education    Comments Tongia can look to her two sons and three grandsons for support. Her friends and familiy come over often and help her through hard times.    Expected Outcomes Short: Start [facility] to help with mood. Long: Maintain a healthy mental state.    Continue Psychosocial Services  Follow up required by staff             Psychosocial Re-Evaluation:  Psychosocial Re-Evaluation     [place] Name 06/23/22  1617             Psychosocial Re-Evaluation   Current issues with None Identified       Comments Patient reports no issues with their current mental states, sleep, stress, depression or anxiety. Will follow up with patient in a few weeks for any changes.       Expected Outcomes Short: Continue to exercise regularly to support mental health and notify staff of any changes. Long: maintain mental health and well being through teaching of rehab or prescribed medications independently.       Interventions Encouraged to attend Pulmonary Rehabilitation for the exercise       Continue Psychosocial Services  Follow up required by staff                Psychosocial Discharge (Final Psychosocial Re-Evaluation):  Psychosocial Re-Evaluation - 06/23/22 1617       Psychosocial Re-Evaluation   Current issues with None Identified    Comments Patient reports no issues with their current mental states, sleep, stress, depression or anxiety. Will follow up with patient in a few weeks for any changes.  Expected Outcomes Short: Continue to exercise regularly to support mental health and notify staff of any changes. Long: maintain mental health and well being through teaching of rehab or prescribed medications independently.    Interventions Encouraged to attend Pulmonary Rehabilitation for the exercise    Continue Psychosocial Services  Follow up required by staff             Education: Education Goals: Education classes will be provided on a weekly basis, covering required topics. Participant will state understanding/return demonstration of topics presented.  Learning Barriers/Preferences:  Learning Barriers/Preferences - 05/05/22 1248       Learning Barriers/Preferences   Learning Barriers None    Learning Preferences None             General Pulmonary Education Topics:  Infection Prevention: - Provides verbal and written material to individual with discussion of infection control  including proper hand washing and proper equipment cleaning during exercise session. Flowsheet Row Pulmonary Rehab from 05/11/2022 in [facility] Cardiac and Pulmonary Rehab  Date 05/05/22  Educator [facility]  Instruction Review Code 1- Verbalizes Understanding       Falls Prevention: - Provides verbal and written material to individual with discussion of falls prevention and safety. Flowsheet Row Pulmonary Rehab from 05/11/2022 in [facility] Cardiac and Pulmonary Rehab  Date 05/05/22  Educator [facility]  Instruction Review Code 1- Verbalizes Understanding       Chronic Lung Disease Review: - Group verbal instruction with posters, models, PowerPoint presentations and videos,  to review new updates, new respiratory medications, new advancements in procedures and treatments. Providing information on websites and "800" numbers for continued self-education. Includes information about supplement oxygen, available portable oxygen systems, continuous and intermittent flow rates, oxygen safety, concentrators, and Medicare reimbursement for oxygen. Explanation of Pulmonary Drugs, including class, frequency, complications, importance of spacers, rinsing mouth after steroid MDI's, and proper cleaning methods for nebulizers. Review of basic lung anatomy and physiology related to function, structure, and complications of lung disease. Review of risk factors. Discussion about methods for diagnosing sleep apnea and types of masks and machines for OSA. Includes a review of the use of types of environmental controls: home humidity, furnaces, filters, dust mite/pet prevention, HEPA vacuums. Discussion about weather changes, air quality and the benefits of nasal washing. Instruction on Warning signs, infection symptoms, calling MD promptly, preventive modes, and value of vaccinations. Review of effective airway clearance, coughing and/or vibration techniques. Emphasizing that all should Create an Action Plan. Written material given at  graduation.   AED/CPR: - Group verbal and written instruction with the use of models to demonstrate the basic use of the AED with the basic ABC's of resuscitation.    Anatomy and Cardiac Procedures: - Group verbal and visual presentation and models provide information about basic cardiac anatomy and function. Reviews the testing methods done to diagnose heart disease and the outcomes of the test results. Describes the treatment choices: Medical Management, Angioplasty, or Coronary Bypass Surgery for treating various heart conditions including Myocardial Infarction, Angina, Valve Disease, and Cardiac Arrhythmias.  Written material given at graduation.   Medication Safety: - Group verbal and visual instruction to review commonly prescribed medications for heart and lung disease. Reviews the medication, class of the drug, and side effects. Includes the steps to properly store meds and maintain the prescription regimen.  Written material given at graduation.   Other: -Provides group and verbal instruction on various topics (see comments)   Knowledge Questionnaire Score:  Knowledge Questionnaire Score - 05/11/22  1530       Knowledge Questionnaire Score   Pre Score 14/18              Core Components/Risk Factors/Patient Goals at Admission:  Personal Goals and Risk Factors at Admission - 05/11/22 1541       Core Components/Risk Factors/Patient Goals on Admission    Weight Management Yes;Weight Loss    Intervention Weight Management: Develop a combined nutrition and exercise program designed to reach desired caloric intake, while maintaining appropriate intake of nutrient and fiber, sodium and fats, and appropriate energy expenditure required for the weight goal.;Weight Management: Provide education and appropriate resources to help participant work on and attain dietary goals.;Weight Management/Obesity: Establish reasonable short term and long term weight goals.    Admit Weight 132  lb 14.4 oz (60.3 kg)    Goal Weight: Short Term 130 lb (59 kg)    Goal Weight: Long Term 125 lb (56.7 kg)    Expected Outcomes Short Term: Continue to assess and modify interventions until short term weight is achieved;Long Term: Adherence to nutrition and physical activity/exercise program aimed toward attainment of established weight goal;Weight Loss: Understanding of general recommendations for a balanced deficit meal plan, which promotes 1-2 lb weight loss per week and includes a negative energy balance of [phone] kcal/d;Understanding recommendations for meals to include 15-35% energy as protein, 25-35% energy from fat, 35-60% energy from carbohydrates, less than 227m of dietary cholesterol, 20-35 gm of total fiber daily;Understanding of distribution of calorie intake throughout the day with the consumption of 4-5 meals/snacks    Tobacco Cessation Yes    Number of packs per day 1/4    Intervention Assist the participant in steps to quit. Provide individualized education and counseling about committing to Tobacco Cessation, relapse prevention, and pharmacological support that can be provided by physician.;OAdvice worker assist with locating and accessing local/national Quit Smoking programs, and support quit date choice.    Expected Outcomes Short Term: Will quit all tobacco product use, adhering to prevention of relapse plan.;Short Term: Will demonstrate readiness to quit, by selecting a quit date.;Long Term: Complete abstinence from all tobacco products for at least 12 months from quit date.    Improve shortness of breath with ADL's Yes    Intervention Provide education, individualized exercise plan and daily activity instruction to help decrease symptoms of SOB with activities of daily living.    Expected Outcomes Short Term: Improve cardiorespiratory fitness to achieve a reduction of symptoms when performing ADLs;Long Term: Be able to perform more ADLs without symptoms or delay the  onset of symptoms    Hypertension Yes    Intervention Provide education on lifestyle modifcations including regular physical activity/exercise, weight management, moderate sodium restriction and increased consumption of fresh fruit, vegetables, and low fat dairy, alcohol moderation, and smoking cessation.;Monitor prescription use compliance.    Expected Outcomes Short Term: Continued assessment and intervention until BP is < 140/947mHG in hypertensive participants. < 130/8012mG in hypertensive participants with diabetes, heart failure or chronic kidney disease.;Long Term: Maintenance of blood pressure at goal levels.    Lipids Yes    Intervention Provide education and support for participant on nutrition & aerobic/resistive exercise along with prescribed medications to achieve LDL <35m37mDL >40mg32m Expected Outcomes Short Term: Participant states understanding of desired cholesterol values and is compliant with medications prescribed. Participant is following exercise prescription and nutrition guidelines.;Long Term: Cholesterol controlled with medications as prescribed, with individualized exercise RX and with personalized  nutrition plan. Value goals: LDL < 30m, HDL > 40 mg.             Education:Diabetes - Individual verbal and written instruction to review signs/symptoms of diabetes, desired ranges of glucose level fasting, after meals and with exercise. Acknowledge that pre and post exercise glucose checks will be done for 3 sessions at entry of program.   Know Your Numbers and Heart Failure: - Group verbal and visual instruction to discuss disease risk factors for cardiac and pulmonary disease and treatment options.  Reviews associated critical values for Overweight/Obesity, Hypertension, Cholesterol, and Diabetes.  Discusses basics of heart failure: signs/symptoms and treatments.  Introduces Heart Failure Zone chart for action plan for heart failure.  Written material given at  graduation.   Core Components/Risk Factors/Patient Goals Review:   Goals and Risk Factor Review     Row Name 06/23/22 1615             Core Components/Risk Factors/Patient Goals Review   Personal Goals Review Improve shortness of breath with ADL's       Review Spoke to patient about their shortness of breath and what they can do to improve. Patient has been informed of breathing techniques when starting the program. Patient is informed to tell staff if they have had any med changes and that certain meds they are taking or not taking can be causing shortness of breath.       Expected Outcomes Short: Attend [facility] regularly to improve shortness of breath with ADL's. Long: maintain independence with ADL's                Core Components/Risk Factors/Patient Goals at Discharge (Final Review):   Goals and Risk Factor Review - 06/23/22 1615       Core Components/Risk Factors/Patient Goals Review   Personal Goals Review Improve shortness of breath with ADL's    Review Spoke to patient about their shortness of breath and what they can do to improve. Patient has been informed of breathing techniques when starting the program. Patient is informed to tell staff if they have had any med changes and that certain meds they are taking or not taking can be causing shortness of breath.    Expected Outcomes Short: Attend [facility] regularly to improve shortness of breath with ADL's. Long: maintain independence with ADL's             ITP Comments:  ITP Comments     Row Name 05/05/22 1246 05/11/22 1523 05/18/22 1613 06/01/22 1052 06/21/22 1458   ITP Comments Virtual Visit completed. Patient informed on EP and RD appointment and 6 Minute walk test. Patient also informed of patient health questionnaires on My Chart. Patient Verbalizes understanding. [facility] Completed 6MWT and gym orientation. Initial ITP created and sent for review to Dr. FOttie Glazier  Medical Director. First full day of exercise!  Patient was oriented to gym and equipment including functions, settings, policies, and procedures.  Patient's individual exercise prescription and treatment plan were reviewed.  All starting workloads were established based on the results of the 6 minute walk test done at initial orientation visit.  The plan for exercise progression was also introduced and progression will be customized based on patient's performance and goals. 30 Day review completed. Medical Director ITP review done, changes made as directed, and signed approval by Medical Director.    new to program We called DTanielto check in with her as she has not  attended rehab since 06/09/2022. She reports not feeling well last week, but she is feeling better. We reminded Leaster to call and let us know if she is going to miss any of her scheduled sessions. She stated that she plans to return to pulmonary rehab on 06/23/2022.    Merrick Name 06/29/22 1018 07/27/22 1431         ITP Comments 30 Day review completed. Medical Director ITP review done, changes made as directed, and signed approval by Medical Director. 30 day review completed. ITP sent to Dr. Zetta Bills, Medical Director of  Pulmonary Rehab. Continue with ITP unless changes are made by physician.  Pt had fallen at home at end of January.  She was scheduled to return on 07/20/22 but has yet to return to rehab.               Comments: 30 day review

## 2022-08-03 ENCOUNTER — Encounter: Payer: 59 | Admitting: *Deleted

## 2022-08-10 DIAGNOSIS — J449 Chronic obstructive pulmonary disease, unspecified: Secondary | ICD-10-CM

## 2022-08-24 ENCOUNTER — Encounter: Payer: Self-pay | Admitting: *Deleted

## 2022-08-24 ENCOUNTER — Encounter: Payer: Medicaid Other | Attending: Internal Medicine

## 2022-08-24 ENCOUNTER — Telehealth: Payer: Self-pay

## 2022-08-24 DIAGNOSIS — J449 Chronic obstructive pulmonary disease, unspecified: Secondary | ICD-10-CM

## 2022-08-24 NOTE — Progress Notes (Signed)
Pulmonary Individual Treatment Plan  Patient Details  Name: Terri Burns MRN: [id number] Date of Birth: 1950/12/09 Referring Provider:   Flowsheet Row Pulmonary Rehab from 05/11/2022 in [facility] Cardiac and Pulmonary Rehab  Referring Provider Dr. Farrel Gordon, MD       Initial Encounter Date:  Flowsheet Row Pulmonary Rehab from 05/11/2022 in [facility] Cardiac and Pulmonary Rehab  Date 05/11/22       Visit Diagnosis: Chronic obstructive pulmonary disease, unspecified COPD type ([place])  Patient's Home Medications on Admission:  Current Outpatient Medications:    ACCU-CHEK GUIDE test strip, USE AS DIRECTED TO Check blood glucose, Disp: , Rfl:    Accu-Chek Softclix Lancets lancets, as directed. (Patient not taking: Reported on 05/05/2022), Disp: , Rfl:    albuterol (PROVENTIL) (2.5 MG/3ML) 0.083% nebulizer solution, Take 3 mLs (2.5 mg total) by nebulization 4 (four) times daily., Disp: 75 mL, Rfl: 12   ALPRAZolam (XANAX) 0.25 MG tablet, Take 0.25 mg by mouth 2 (two) times daily as needed., Disp: , Rfl:    amLODipine (NORVASC) 10 MG tablet, Take 1 tablet (10 mg total) by mouth daily., Disp: 30 tablet, Rfl: 11   aspirin 81 MG EC tablet, TAKE 1 TABLET BY MOUTH EVERY DAY, Disp: 90 tablet, Rfl: 0   atorvastatin (LIPITOR) 20 MG tablet, Take 20 mg by mouth every morning. (Patient not taking: Reported on 05/05/2022), Disp: , Rfl:    atorvastatin (LIPITOR) 40 MG tablet, Take 2 tablets (80 mg total) by mouth daily., Disp: 180 tablet, Rfl: 1   atorvastatin (LIPITOR) 80 MG tablet, , Disp: , Rfl:    azithromycin (ZITHROMAX) 250 MG tablet, Take 250 mg by mouth daily., Disp: , Rfl:    Blood Glucose Monitoring Suppl (ACCU-CHEK GUIDE) w/Device KIT, USE AS DIRECTED TO Check blood glucose, Disp: , Rfl:    BRILINTA 90 MG TABS tablet, TAKE ONE TABLET BY MOUTH EVERY MORNING and TAKE ONE TABLET BY MOUTH EVERYDAY AT BEDTIME, Disp: 60 tablet, Rfl: 0   citalopram (CELEXA) 20 MG tablet, TAKE 1 AND 1/2 TABLETS BY  MOUTH EVERY EVENING, Disp: 90 tablet, Rfl: 0   fluticasone (FLONASE) 50 MCG/ACT nasal spray, PLACE TWO SPRAYS into BOTH nostrils DAILY, Disp: 48 g, Rfl: 1   Fluticasone-Umeclidin-Vilant (TRELEGY ELLIPTA) 100-62.5-25 MCG/ACT AEPB, Inhale 1 puff into the lungs daily., Disp: 1 each, Rfl: 11   ipratropium-albuterol (DUONEB) 0.5-2.5 (3) MG/3ML SOLN, Take by nebulization., Disp: , Rfl:    isosorbide mononitrate (IMDUR) 30 MG 24 hr tablet, TAKE 1/2 TABLET BY MOUTH ONCE DAILY, Disp: 15 tablet, Rfl: 0   loratadine (ALLERGY RELIEF) 10 MG tablet, Take 1 tablet (10 mg total) by mouth daily., Disp: 90 tablet, Rfl: 1   losartan (COZAAR) 100 MG tablet, Take 1 tablet (100 mg total) by mouth every evening., Disp: 90 tablet, Rfl: 1   metoprolol succinate (TOPROL-XL) 25 MG 24 hr tablet, Take 1 tablet (25 mg total) by mouth every evening., Disp: 90 tablet, Rfl: 1   NARCAN 4 MG/0.1ML LIQD nasal spray kit, 1 spray as needed. As needed in case of overdose (Patient not taking: Reported on 05/05/2022), Disp: , Rfl:    pantoprazole (PROTONIX) 40 MG tablet, Take 1 tablet (40 mg total) by mouth every evening., Disp: 90 tablet, Rfl: 1   predniSONE (DELTASONE) 10 MG tablet, 60 mg tomorrow, taper by 10 mg daily until complete (Patient not taking: Reported on 05/05/2022), Disp: 21 tablet, Rfl: 0   predniSONE (DELTASONE) 20 MG tablet, Take 20 mg by mouth 2 (two)  times daily. 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    Hyperlipidemia    Hypertension    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    Tobacco Use: Social History   Tobacco Use  Smoking Status Every Day   Packs/day: 0.25   Years: 50.00   Additional pack years: 0.00   Total pack years: 12.50   Types: Cigarettes   Start date: 12/10/1973  Smokeless Tobacco Never  Tobacco Comments   1-2 ciggs per day - 04/13/2021; has a hx of 2 PPD x 10 years     Labs: Review Flowsheet  More data exists      Latest Ref Rng & Units 02/21/2020 02/02/2021 02/19/2021 02/25/2021 01/05/2022  Labs for ITP Cardiac and Pulmonary Rehab  Cholestrol <200 mg/dL 173  - - - -  LDL (calc) mg/dL (calc) 61  - - - -  Direct LDL 0 - 99 mg/dL - - - 45.5  -  HDL-C > OR = 50 mg/dL 101  - - - -  Trlycerides <150 mg/dL 39  - - - -  PH, Arterial 7.350 - 7.450 - 7.36  7.24  - - -  PCO2 arterial 32.0 - 48.0 mmHg - 44  60  - - -  Bicarbonate 20.0 - 28.0 mmol/L - 24.9  25.7  22.7  - 26.2   Acid-base deficit 0.0 - 2.0 mmol/L - 0.8  2.7  3.6  - 2.8   O2 Saturation % - 97.6  99.7  88.0  - 86.1      Pulmonary Assessment Scores:  Pulmonary Assessment Scores     Row Name 05/11/22 1542         ADL UCSD   ADL Phase Entry     SOB Score total 48     Rest 0     Walk 1     Stairs 5     Bath 2     Dress 2     Shop 3       CAT Score   CAT Score 17       mMRC Score   mMRC Score 1              UCSD: Self-administered rating of dyspnea associated with activities of daily living (ADLs) 6-point scale (0 = "not at all" to 5 = "maximal or unable to do because of breathlessness")  Scoring Scores range from 0 to 120.  Minimally important difference is 5 units  CAT: CAT can identify the health impairment of COPD patients and is better correlated with disease progression.  CAT has a scoring range of zero to 40. The CAT score is classified into four groups of low (less than 10), medium (10 -  20), high (21-30) and very high (31-40) based on the impact level of disease on health status.  A CAT score over 10 suggests significant symptoms.  A worsening CAT score could be explained by an exacerbation, poor medication adherence, poor inhaler technique, or progression of COPD or comorbid conditions.  CAT MCID is 2 points  mMRC: mMRC (Modified Medical Research Council) Dyspnea Scale is used to assess the degree of baseline functional disability in patients of respiratory disease due to dyspnea. No minimal important difference is established. A decrease in score of 1 point or greater is considered a positive change.   Pulmonary Function Assessment:  Pulmonary Function Assessment - 05/05/22 1247       Breath   Shortness of Breath Yes;Limiting activity;Panic with Shortness of Breath             Exercise Target Goals: Exercise Program Goal: Individual exercise prescription set using results from initial 6 min walk test and THRR while considering  patient's activity barriers and safety.   Exercise Prescription Goal: Initial exercise prescription builds to 30-45 minutes a day of aerobic activity, 2-3 days per week.  Home exercise guidelines will be given to patient during program as part of exercise prescription that the participant will acknowledge.  Education: Aerobic Exercise: - Group verbal and visual presentation on the components of exercise prescription. Introduces F.I.T.T principle from ACSM for exercise prescriptions.  Reviews F.I.T.T. principles of aerobic exercise including progression. Written material given at graduation. Flowsheet Row Pulmonary Rehab from 05/11/2022 in [facility] Cardiac and Pulmonary Rehab  Education need identified 05/11/22       Education: Resistance Exercise: - Group verbal and visual presentation on the components of exercise prescription. Introduces F.I.T.T principle from ACSM for exercise prescriptions  Reviews F.I.T.T. principles of resistance  exercise including progression. Written material given at graduation.    Education: Exercise & Equipment Safety: - Individual verbal instruction and demonstration of equipment use and safety with use of the equipment. Flowsheet Row Pulmonary Rehab from 05/11/2022 in [facility] Cardiac and Pulmonary Rehab  Date 05/05/22  Educator [facility]  Instruction Review Code 1- Verbalizes Understanding       Education: Exercise Physiology & General Exercise Guidelines: - Group verbal and written instruction with models to review the exercise physiology of the cardiovascular system and associated critical values. Provides general exercise guidelines with specific guidelines to those with heart or lung disease.    Education: Flexibility, Balance, Mind/Body Relaxation: - Group verbal and visual presentation with interactive activity on the components of exercise prescription. Introduces F.I.T.T principle from ACSM for exercise prescriptions. Reviews F.I.T.T. principles of flexibility and balance exercise training including progression. Also discusses the mind body connection.  Reviews various relaxation techniques to help reduce and manage stress (i.e. Deep breathing, progressive muscle relaxation, and visualization). Balance handout provided to take home. Written material given at graduation.   Activity Barriers & Risk Stratification:  Activity Barriers & Cardiac Risk Stratification - 05/11/22 1551       Activity Barriers & Cardiac Risk Stratification   Activity Barriers Neck/Spine Problems;Deconditioning;Muscular Weakness;Shortness of Breath             6 Minute Walk:  6 Minute Walk     Row Name 05/11/22 1548         6 Minute Walk   Phase Initial     Distance 655 feet     Walk Time 5.85 minutes     # of Rest Breaks 1     MPH 1.27     METS 1.63     RPE 11     Perceived Dyspnea  1     VO2 Peak 5.72     Symptoms Yes (comment)     Comments Neck/Shoulder pain (5/10)     Resting HR 71 bpm      Resting BP 128/76     Resting Oxygen Saturation  98 %     Exercise Oxygen Saturation  during 6 min walk 89 %     Max Ex. HR 91 bpm     Max Ex. BP 148/88     2 Minute Post BP 136/82       Interval HR   1 Minute HR 80     2 Minute HR 85     3 Minute HR 87     4 Minute HR 89     5 Minute HR 91     6 Minute HR 88     2 Minute Post HR 79     Interval Heart Rate? Yes       Interval Oxygen   Interval Oxygen? Yes     Baseline Oxygen Saturation % 98 %     1 Minute Oxygen Saturation % 92 %     1 Minute Liters of Oxygen 0 L  RA     2 Minute Oxygen Saturation % 92 %     2 Minute Liters of Oxygen 0 L  RA     3 Minute Oxygen Saturation % 92 %     3 Minute Liters of Oxygen 0 L  RA     4 Minute Oxygen Saturation % 90 %     4 Minute Liters of Oxygen 0 L  RA     5 Minute Oxygen Saturation % 90 %     5 Minute Liters of Oxygen 0 L  RA     6 Minute Oxygen Saturation % 89 %     6 Minute Liters of Oxygen 0 L  RA     2 Minute Post Oxygen Saturation % 97 %     2 Minute Post Liters of Oxygen 0 L  RA             Oxygen Initial Assessment:  Oxygen Initial Assessment - 05/05/22 1247       Home Oxygen   Home Oxygen Device None    Sleep Oxygen Prescription None    Home Exercise Oxygen Prescription None    Home Resting Oxygen Prescription None      Initial 6 min Walk   Oxygen Used None      Program Oxygen Prescription   Program Oxygen Prescription None      Intervention   Short Term Goals To learn and exhibit compliance with exercise, home and travel O2 prescription;To learn and understand importance of maintaining oxygen saturations>88%;To learn and demonstrate proper use of respiratory medications;To learn and demonstrate proper pursed lip breathing techniques or other breathing techniques. ;To learn and understand importance of monitoring SPO2 with pulse oximeter and demonstrate accurate use of the pulse oximeter.    Long  Term Goals Exhibits compliance with exercise, home  and travel O2  prescription;Maintenance of O2 saturations>88%;Compliance with respiratory medication;Demonstrates proper use of MDI's;Exhibits proper breathing techniques, such as pursed lip breathing or other method taught during program session;Verbalizes importance of monitoring SPO2 with pulse oximeter and return demonstration             Oxygen Re-Evaluation:  Oxygen Re-Evaluation     Row Name 05/18/22 1614 06/23/22 1612  Program Oxygen Prescription   Program Oxygen Prescription -- None        Home Oxygen   Home Oxygen Device -- None      Sleep Oxygen Prescription -- None      Home Exercise Oxygen Prescription -- None      Home Resting Oxygen Prescription -- None        Goals/Expected Outcomes   Short Term Goals To learn and exhibit compliance with exercise, home and travel O2 prescription;To learn and understand importance of maintaining oxygen saturations>88%;To learn and demonstrate proper use of respiratory medications;To learn and demonstrate proper pursed lip breathing techniques or other breathing techniques. ;To learn and understand importance of monitoring SPO2 with pulse oximeter and demonstrate accurate use of the pulse oximeter. To learn and understand importance of maintaining oxygen saturations>88%;To learn and understand importance of monitoring SPO2 with pulse oximeter and demonstrate accurate use of the pulse oximeter.      Long  Term Goals Exhibits compliance with exercise, home  and travel O2 prescription;Maintenance of O2 saturations>88%;Compliance with respiratory medication;Demonstrates proper use of MDI's;Exhibits proper breathing techniques, such as pursed lip breathing or other method taught during program session;Verbalizes importance of monitoring SPO2 with pulse oximeter and return demonstration Maintenance of O2 saturations>88%      Comments Reviewed PLB technique with pt.  Talked about how it works and it's importance in maintaining their exercise saturations.  Taren has been able to practice PLB at home. She is doing well in the program despite her mobility.She has a pulse oximeter to check her/his oxygen saturation at home. Informed and explained why it is important to have one. Reviewed that oxygen saturations should be 88 percent and above. Patient verbalizes understanding.      Goals/Expected Outcomes Short: Become more profiecient at using PLB.   Long: Become independent at using PLB. Short: monitor oxygen at home with exertion. Long: maintain oxygen saturations above 88 percent independently.               Oxygen Discharge (Final Oxygen Re-Evaluation):  Oxygen Re-Evaluation - 06/23/22 1612       Program Oxygen Prescription   Program Oxygen Prescription None      Home Oxygen   Home Oxygen Device None    Sleep Oxygen Prescription None    Home Exercise Oxygen Prescription None    Home Resting Oxygen Prescription None      Goals/Expected Outcomes   Short Term Goals To learn and understand importance of maintaining oxygen saturations>88%;To learn and understand importance of monitoring SPO2 with pulse oximeter and demonstrate accurate use of the pulse oximeter.    Long  Term Goals Maintenance of O2 saturations>88%    Comments Gerda has been able to practice PLB at home. She is doing well in the program despite her mobility.She has a pulse oximeter to check her/his oxygen saturation at home. Informed and explained why it is important to have one. Reviewed that oxygen saturations should be 88 percent and above. Patient verbalizes understanding.    Goals/Expected Outcomes Short: monitor oxygen at home with exertion. Long: maintain oxygen saturations above 88 percent independently.             Initial Exercise Prescription:  Initial Exercise Prescription - 05/11/22 1600       Date of Initial Exercise RX and Referring Provider   Date 05/11/22    Referring Provider Dr. Farrel Gordon, MD      Oxygen   Oxygen Continuous     Maintain Oxygen  Saturation 88% or higher      Recumbant Bike   Level 1    RPM 50    Watts 10    Minutes 15    METs 1.63      NuStep   Level 1    SPM 80    Minutes 15    METs 1.63      Biostep-RELP   Level 1    SPM 60    Minutes 15    METs 1.63      Track   Laps 15    Minutes 15    METs 1.82      Prescription Details   Frequency (times per week) 2    Duration Progress to 30 minutes of continuous aerobic without signs/symptoms of physical distress      Intensity   THRR 40-80% of Max Heartrate 102-134    Ratings of Perceived Exertion 11-13    Perceived Dyspnea 0-4      Progression   Progression Continue to progress workloads to maintain intensity without signs/symptoms of physical distress.      Resistance Training   Training Prescription Yes    Weight 2 lb    Reps 10-15             Perform Capillary Blood Glucose checks as needed.  Exercise Prescription Changes:   Exercise Prescription Changes     Row Name 05/11/22 1600 05/31/22 1400 06/13/22 1300 06/28/22 1300 07/11/22 1500     Response to Exercise   Blood Pressure (Admit) 128/76 124/72 122/78 126/64 118/62   Blood Pressure (Exercise) 148/88 140/86 138/62 122/62 148/72   Blood Pressure (Exit) 136/82 110/60 118/70 122/70 128/64   Heart Rate (Admit) 71 bpm 79 bpm 82 bpm 80 bpm 77 bpm   Heart Rate (Exercise) 91 bpm 101 bpm 91 bpm 91 bpm 99 bpm   Heart Rate (Exit) 79 bpm 81 bpm 79 bpm 73 bpm 83 bpm   Oxygen Saturation (Admit) 98 % 97 % 96 % 94 % 94 %   Oxygen Saturation (Exercise) 89 % 90 % 89 % 91 % 93 %   Oxygen Saturation (Exit) 97 % 96 % 95 % 95 % 94 %   Rating of Perceived Exertion (Exercise) 11 15 13 12 15    Perceived Dyspnea (Exercise) 1 2 1  0 2   Symptoms Neck/shoulder pain (5/10) -- SOB, fatigue, hip pain none SOB   Comments 6MWT Results First three sessions in rehab -- -- --   Duration -- Progress to 30 minutes of  aerobic without signs/symptoms of physical distress Progress to 30 minutes of   aerobic without signs/symptoms of physical distress Progress to 30 minutes of  aerobic without signs/symptoms of physical distress Continue with 30 min of aerobic exercise without signs/symptoms of physical distress.   Intensity -- THRR unchanged THRR unchanged THRR unchanged THRR unchanged     Progression   Progression -- Continue to progress workloads to maintain intensity without signs/symptoms of physical distress. Continue to progress workloads to maintain intensity without signs/symptoms of physical distress. Continue to progress workloads to maintain intensity without signs/symptoms of physical distress. Continue to progress workloads to maintain intensity without signs/symptoms of physical distress.   Average METs -- 1.69 1.88 2 1.84     Resistance Training   Training Prescription -- Yes Yes Yes Yes   Weight -- 2 lb 2 lb 2 lb 2 lb   Reps -- 10-15 10-15 10-15 10-15     Interval Training  Interval Training -- No No No No     Recumbant Bike   Level -- -- 1 -- --   Watts -- -- 10 -- --   Minutes -- -- 15 -- --   METs -- -- 2.53 -- --     NuStep   Level -- 3 3 2 3    Minutes -- 15 15 15 15    METs -- 1.9 2 2  1.8     Biostep-RELP   Level -- 1 1 1  --   Minutes -- 15 15 15  --   METs -- 1 2 2  --     Track   Laps -- 17 -- -- 17   Minutes -- 15 -- -- 15   METs -- 1.92 -- -- 1.92     Oxygen   Maintain Oxygen Saturation -- 88% or higher 88% or higher 88% or higher 88% or higher            Exercise Comments:   Exercise Comments     Row Name 05/18/22 1613           Exercise Comments First full day of exercise!  Patient was oriented to gym and equipment including functions, settings, policies, and procedures.  Patient's individual exercise prescription and treatment plan were reviewed.  All starting workloads were established based on the results of the 6 minute walk test done at initial orientation visit.  The plan for exercise progression was also introduced and  progression will be customized based on patient's performance and goals.                Exercise Goals and Review:   Exercise Goals     Row Name 05/11/22 1551             Exercise Goals   Increase Physical Activity Yes       Intervention Provide advice, education, support and counseling about physical activity/exercise needs.;Develop an individualized exercise prescription for aerobic and resistive training based on initial evaluation findings, risk stratification, comorbidities and participant's personal goals.       Expected Outcomes Short Term: Attend rehab on a regular basis to increase amount of physical activity.;Long Term: Exercising regularly at least 3-5 days a week.;Long Term: Add in home exercise to make exercise part of routine and to increase amount of physical activity.       Increase Strength and Stamina Yes       Intervention Provide advice, education, support and counseling about physical activity/exercise needs.;Develop an individualized exercise prescription for aerobic and resistive training based on initial evaluation findings, risk stratification, comorbidities and participant's personal goals.       Expected Outcomes Short Term: Increase workloads from initial exercise prescription for resistance, speed, and METs.;Short Term: Perform resistance training exercises routinely during rehab and add in resistance training at home;Long Term: Improve cardiorespiratory fitness, muscular endurance and strength as measured by increased METs and functional capacity (6MWT)       Able to understand and use rate of perceived exertion (RPE) scale Yes       Intervention Provide education and explanation on how to use RPE scale       Expected Outcomes Short Term: Able to use RPE daily in rehab to express subjective intensity level;Long Term:  Able to use RPE to guide intensity level when exercising independently       Able to understand and use Dyspnea scale Yes       Intervention  Provide education and explanation on  how to use Dyspnea scale       Expected Outcomes Short Term: Able to use Dyspnea scale daily in rehab to express subjective sense of shortness of breath during exertion;Long Term: Able to use Dyspnea scale to guide intensity level when exercising independently       Knowledge and understanding of Target Heart Rate Range (THRR) Yes       Intervention Provide education and explanation of THRR including how the numbers were predicted and where they are located for reference       Expected Outcomes Long Term: Able to use THRR to govern intensity when exercising independently;Short Term: Able to state/look up THRR;Short Term: Able to use daily as guideline for intensity in rehab       Able to check pulse independently Yes       Intervention Provide education and demonstration on how to check pulse in carotid and radial arteries.;Review the importance of being able to check your own pulse for safety during independent exercise       Expected Outcomes Short Term: Able to explain why pulse checking is important during independent exercise;Long Term: Able to check pulse independently and accurately       Understanding of Exercise Prescription Yes       Intervention Provide education, explanation, and written materials on patient's individual exercise prescription       Expected Outcomes Short Term: Able to explain program exercise prescription;Long Term: Able to explain home exercise prescription to exercise independently                Exercise Goals Re-Evaluation :  Exercise Goals Re-Evaluation     Row Name 05/18/22 1613 05/31/22 1502 06/13/22 1336 06/28/22 1331 07/11/22 1516     Exercise Goal Re-Evaluation   Exercise Goals Review Increase Physical Activity;Able to understand and use rate of perceived exertion (RPE) scale;Understanding of Exercise Prescription;Knowledge and understanding of Target Heart Rate Range (THRR);Increase Strength and Stamina;Able to  check pulse independently Increase Physical Activity;Increase Strength and Stamina;Understanding of Exercise Prescription Increase Physical Activity;Increase Strength and Stamina;Understanding of Exercise Prescription Increase Physical Activity;Increase Strength and Stamina;Understanding of Exercise Prescription Increase Physical Activity;Increase Strength and Stamina;Understanding of Exercise Prescription   Comments Reviewed RPE scale, THR and program prescription with pt today.  Pt voiced understanding and was given a copy of goals to take home. Jaileen is off to a good start in the program. She had an average MET level of 1.69 METs during her first three sessions. She also was able to improve to level 3 on the T4. She consistently walked 17 laps on the track as well. We will continue to monitor her progress in the program. Maicee is doing well in rehab. She is limited with her walking and some seated machines due to hip pain.  She has stayed consistent at level 3 on the T4 Nustep but would benefit from increasing to level 2 on the Biostep. She is not quite hitting her THR but may to better if she is able to walk more. We will continue to monitor. Marchia has only attended rehab once the last review due to being out sick. She is still limited on her walking, but has done well on the T4 and biostep and has consistantly worked at 2 METs. We will encourage her to increase her workloads on the seated machines. We will continue to monitor her progress in the program. Thia has been sporadic with attendance with rehab, she had completed 2 sessions since last review.  Staff will review attendance policy with patient to make sure she maintains good attendance to yield best results. She was able to walk a total of 17 laps  and work at level 3 on the T4 Nustep. She is not quite hitting her THR and will encourage an increase in workloads as well. We will continue to monitor   Expected Outcomes Short: Use RPE daily to regulate  intensity.  Long: Follow program prescription in THR. Short: Continue to follow current exercise prescription. Long: Continue to improve strength and stamina. Short: Add more walking as tolerated with hip pain Long: Continue to increase overall MET level Short: Return to regular attendance in rehab, and increase workloads on seated machines. Long: Continue to improve strength and stamina. Short: Maintain good attendance and increase laps on track Long: Continue to increase overall MET level and stamina    Row Name 07/27/22 1409 08/08/22 1542 08/23/22 1402         Exercise Goal Re-Evaluation   Exercise Goals Review Increase Physical Activity;Increase Strength and Stamina;Understanding of Exercise Prescription Increase Physical Activity;Increase Strength and Stamina;Understanding of Exercise Prescription Increase Physical Activity;Increase Strength and Stamina;Understanding of Exercise Prescription     Comments Emmi has not attended rehab since the last review. She informed us that she has had some appointments that have conflicted with her rehab sessions. She also recently fell and has been sore. We will contact patient to see when she plans to return to the program. We will continue to monitor her progress once she returns to the program. Patient has not been here since last review. We have attempted to follow up with patient on her return back. If no response by patient soon, we will send letter for potential discharge. Ajanique has not attended rehab since 06/30/2022. She is still recovering from her fall weeks ago and wants to talk to her PCP about resuming our program. She wants to come back but wants to talk to MD first. She had an appt for 03/14 and stated that she will keep [place] updated on her return to the program.     Expected Outcomes Short: Return to rehab. Long: Continue to improve strength and stamina. Short: Return to rehab Long: Continue to increase overall MET level and stamina Short: Return to  rehab. Long: Continue to improve strength and stamina.              Discharge Exercise Prescription (Final Exercise Prescription Changes):  Exercise Prescription Changes - 07/11/22 1500       Response to Exercise   Blood Pressure (Admit) 118/62    Blood Pressure (Exercise) 148/72    Blood Pressure (Exit) 128/64    Heart Rate (Admit) 77 bpm    Heart Rate (Exercise) 99 bpm    Heart Rate (Exit) 83 bpm    Oxygen Saturation (Admit) 94 %    Oxygen Saturation (Exercise) 93 %    Oxygen Saturation (Exit) 94 %    Rating of Perceived Exertion (Exercise) 15    Perceived Dyspnea (Exercise) 2    Symptoms SOB    Duration Continue with 30 min of aerobic exercise without signs/symptoms of physical distress.    Intensity THRR unchanged      Progression   Progression Continue to progress workloads to maintain intensity without signs/symptoms of physical distress.    Average METs 1.84      Resistance Training   Training Prescription Yes    Weight 2 lb    Reps 10-15  Interval Training   Interval Training No      NuStep   Level 3    Minutes 15    METs 1.8      Track   Laps 17    Minutes 15    METs 1.92      Oxygen   Maintain Oxygen Saturation 88% or higher             Nutrition:  Target Goals: Understanding of nutrition guidelines, daily intake of sodium 1500mg , cholesterol 200mg , calories 30% from fat and 7% or less from saturated fats, daily to have 5 or more servings of fruits and vegetables.  Education: All About Nutrition: -Group instruction provided by verbal, written material, interactive activities, discussions, models, and posters to present general guidelines for heart healthy nutrition including fat, fiber, MyPlate, the role of sodium in heart healthy nutrition, utilization of the nutrition label, and utilization of this knowledge for meal planning. Follow up email sent as well. Written material given at graduation. Flowsheet Row Pulmonary Rehab from  05/11/2022 in [facility] Card[facility]monary Rehab  Education need identified 05/11/22       Biometrics:  Pre Biometrics - 05/11/22 1552       Pre Biometrics   Height 4' 10.5" (1.486 m)    Weight 132 lb 14.4 oz (60.3 kg)    Waist Circumference 36.5 inches    Hip Circumference 40.5 inches    Waist to Hip Ratio 0.9 %    BMI (Calculated) 27.3    Single Leg Stand 1.5 seconds   L             Nutrition Therapy Plan and Nutrition Goals:  Nutrition Therapy & Goals - 05/11/22 1505       Nutrition Therapy   Diet Heart healthy, low Na, pulmonary MNT.    Drug/Food Interactions Statins/Certain Fruits    Protein (specify units) 60-70g    Fiber 25 grams    Whole Grain Foods 3 servings    Saturated Fats 12 max. grams    Fruits and Vegetables 5 servings/day    Sodium 2 grams      Personal Nutrition Goals   Nutrition Goal ST: eat a protein source at each meal/snack, consider including ensure into diet LT: meet protein/energy needs, limit saturated fat <12g/day, limit sodium <2g/day    Comments 72 y.o F admitted to pulmonary rehab for COPD. PMHx includes CAD, HFpEF, HTN, HLD, tobacco use. Relevant medications lipitor, clelexa, trelegy, protonix, prednisone, tramadol, xanax. B: egg and sometimes grits L: fruit cup and some cheese - sometimes also tomatoes D: fish or chicken with cabbage (stemaed or slaw), tomatoes, with a slice of bread (white bread) Drinks: water and coffee (black). She reads food labels, uses Mrs Deliah Boston seasoning, and general eats whole or minimally processed foods. Lillyth reports hvaing issues with hypoglycemia - no hx of diabetes. Discussed heart healthy eating, pulmonary MNT, and nutrition for maintaining a stable BG. Kharizma wanted to review other protein sources besides meat as well - discussed other options as well as protein needs - she reports having ensure in the past.      Intervention Plan   Intervention Prescribe, educate and counsel regarding individualized specific  dietary modifications aiming towards targeted core components such as weight, hypertension, lipid management, diabetes, heart failure and other comorbidities.;Nutrition handout(s) given to patient.    Expected Outcomes Short Term Goal: A plan has been developed with personal nutrition goals set during dietitian appointment.;Long Term Goal: Adherence to prescribed nutrition  plan.;Short Term Goal: Understand basic principles of dietary content, such as calories, fat, sodium, cholesterol and nutrients.             Nutrition Assessments:  MEDIFICTS Score Key: ?70 Need to make dietary changes  40-70 Heart Healthy Diet ? 40 Therapeutic Level Cholesterol Diet  Flowsheet Row Pulmonary Rehab from 05/11/2022 in [facility] Cardiac and Pulmonary Rehab  Picture Your Plate Total Score on Admission 70      Picture Your Plate Scores: [id number] Unhealthy dietary pattern with much room for improvement. 41-50 Dietary pattern unlikely to meet recommendations for good health and room for improvement. 51-60 More healthful dietary pattern, with some room for improvement.  >60 Healthy dietary pattern, although there may be some specific behaviors that could be improved.   Nutrition Goals Re-Evaluation:  Nutrition Goals Re-Evaluation     [place] Name 06/23/22 1616             Goals   Nutrition Goal Watch portion sizes       Comment Patient was informed on why it is important to maintain a balanced diet when dealing with Respiratory issues. Explained that it takes a lot of energy to breath and when they are short of breath often they will need to have a good diet to help keep up with the calories they are expending for breathing.       Expected Outcome Short: Choose and plan snacks accordingly to patients caloric intake to improve breathing. Long: Maintain a diet independently that meets their caloric intake to aid in daily shortness of breath.                Nutrition Goals Discharge (Final Nutrition Goals  Re-Evaluation):  Nutrition Goals Re-Evaluation - 06/23/22 1616       Goals   Nutrition Goal Watch portion sizes    Comment Patient was informed on why it is important to maintain a balanced diet when dealing with Respiratory issues. Explained that it takes a lot of energy to breath and when they are short of breath often they will need to have a good diet to help keep up with the calories they are expending for breathing.    Expected Outcome Short: Choose and plan snacks accordingly to patients caloric intake to improve breathing. Long: Maintain a diet independently that meets their caloric intake to aid in daily shortness of breath.             Psychosocial: Target Goals: Acknowledge presence or absence of significant depression and/or stress, maximize coping skills, provide positive support system. Participant is able to verbalize types and ability to use techniques and skills needed for reducing stress and depression.   Education: Stress, Anxiety, and Depression - Group verbal and visual presentation to define topics covered.  Reviews how body is impacted by stress, anxiety, and depression.  Also discusses healthy ways to reduce stress and to treat/manage anxiety and depression.  Written material given at graduation.   Education: Sleep Hygiene -Provides group verbal and written instruction about how sleep can affect your health.  Define sleep hygiene, discuss sleep cycles and impact of sleep habits. Review good sleep hygiene tips.    Initial Review & Psychosocial Screening:  Initial Psych Review & Screening - 05/05/22 1249       Initial Review   Current issues with None Identified      Family Dynamics   Good Support System? Yes    Comments Narcedalia can look to her two sons and three  grandsons for support. Her friends and familiy come over often and help her through hard times.      Barriers   Psychosocial barriers to participate in program The patient should benefit from  training in stress management and relaxation.      Screening Interventions   Interventions Encouraged to exercise;To provide support and resources with identified psychosocial needs;Provide feedback about the scores to participant    Expected Outcomes Short Term goal: Utilizing psychosocial counselor, staff and physician to assist with identification of specific Stressors or current issues interfering with healing process. Setting desired goal for each stressor or current issue identified.;Long Term Goal: Stressors or current issues are controlled or eliminated.;Short Term goal: Identification and review with participant of any Quality of Life or Depression concerns found by scoring the questionnaire.;Long Term goal: The participant improves quality of Life and PHQ9 Scores as seen by post scores and/or verbalization of changes             Quality of Life Scores:  Scores of 19 and below usually indicate a poorer quality of life in these areas.  A difference of  2-3 points is a clinically meaningful difference.  A difference of 2-3 points in the total score of the Quality of Life Index has been associated with significant improvement in overall quality of life, self-image, physical symptoms, and general health in studies assessing change in quality of life.  PHQ-9: Review Flowsheet  More data exists      05/11/2022 10/05/2021 07/13/2021 04/02/2021 03/30/2021  Depression screen PHQ 2/9  Decreased Interest 0 0 0 0 0 0  Down, Depressed, Hopeless 0 0 0 0 0 0  PHQ - 2 Score 0 0 0 0 0 0  Altered sleeping 0 0 0 1 0  Tired, decreased energy 1 0 0 1 0  Change in appetite 0 0 0 1 0  Feeling bad or failure about yourself  0 0 0 0 0  Trouble concentrating 0 0 0 0 0  Moving slowly or fidgety/restless 0 0 0 0 0  Suicidal thoughts 0 0 0 0 0  PHQ-9 Score 1 0 0 3 0  Difficult doing work/chores Somewhat difficult Not difficult at all - Not difficult at all Not difficult at all   Interpretation of Total  Score  Total Score Depression Severity:  1-4 = Minimal depression, 5-9 = Mild depression, 10-14 = Moderate depression, 15-19 = Moderately severe depression, 20-27 = Severe depression   Psychosocial Evaluation and Intervention:  Psychosocial Evaluation - 05/05/22 1251       Psychosocial Evaluation & Interventions   Interventions Encouraged to exercise with the program and follow exercise prescription;Relaxation education;Stress management education    Comments Laquita can look to her two sons and three grandsons for support. Her friends and familiy come over often and help her through hard times.    Expected Outcomes Short: Start [facility] to help with mood. Long: Maintain a healthy mental state.    Continue Psychosocial Services  Follow up required by staff             Psychosocial Re-Evaluation:  Psychosocial Re-Evaluation     [place] Name 06/23/22 1617             Psychosocial Re-Evaluation   Current issues with None Identified       Comments Patient reports no issues with their current mental states, sleep, stress, depression or anxiety. Will follow up with patient in a few weeks for any changes.  Expected Outcomes Short: Continue to exercise regularly to support mental health and notify staff of any changes. Long: maintain mental health and well being through teaching of rehab or prescribed medications independently.       Interventions Encouraged to attend Pulmonary Rehabilitation for the exercise       Continue Psychosocial Services  Follow up required by staff                Psychosocial Discharge (Final Psychosocial Re-Evaluation):  Psychosocial Re-Evaluation - 06/23/22 1617       Psychosocial Re-Evaluation   Current issues with None Identified    Comments Patient reports no issues with their current mental states, sleep, stress, depression or anxiety. Will follow up with patient in a few weeks for any changes.    Expected Outcomes Short: Continue to exercise  regularly to support mental health and notify staff of any changes. Long: maintain mental health and well being through teaching of rehab or prescribed medications independently.    Interventions Encouraged to attend Pulmonary Rehabilitation for the exercise    Continue Psychosocial Services  Follow up required by staff             Education: Education Goals: Education classes will be provided on a weekly basis, covering required topics. Participant will state understanding/return demonstration of topics presented.  Learning Barriers/Preferences:  Learning Barriers/Preferences - 05/05/22 1248       Learning Barriers/Preferences   Learning Barriers None    Learning Preferences None             General Pulmonary Education Topics:  Infection Prevention: - Provides verbal and written material to individual with discussion of infection control including proper hand washing and proper equipment cleaning during exercise session. Flowsheet Row Pulmonary Rehab from 05/11/2022 in [facility] Cardiac and Pulmonary Rehab  Date 05/05/22  Educator [facility]  Instruction Review Code 1- Verbalizes Understanding       Falls Prevention: - Provides verbal and written material to individual with discussion of falls prevention and safety. Flowsheet Row Pulmonary Rehab from 05/11/2022 in [facility] Cardiac and Pulmonary Rehab  Date 05/05/22  Educator [facility]  Instruction Review Code 1- Verbalizes Understanding       Chronic Lung Disease Review: - Group verbal instruction with posters, models, PowerPoint presentations and videos,  to review new updates, new respiratory medications, new advancements in procedures and treatments. Providing information on websites and "800" numbers for continued self-education. Includes information about supplement oxygen, available portable oxygen systems, continuous and intermittent flow rates, oxygen safety, concentrators, and Medicare reimbursement for oxygen. Explanation of  Pulmonary Drugs, including class, frequency, complications, importance of spacers, rinsing mouth after steroid MDI's, and proper cleaning methods for nebulizers. Review of basic lung anatomy and physiology related to function, structure, and complications of lung disease. Review of risk factors. Discussion about methods for diagnosing sleep apnea and types of masks and machines for OSA. Includes a review of the use of types of environmental controls: home humidity, furnaces, filters, dust mite/pet prevention, HEPA vacuums. Discussion about weather changes, air quality and the benefits of nasal washing. Instruction on Warning signs, infection symptoms, calling MD promptly, preventive modes, and value of vaccinations. Review of effective airway clearance, coughing and/or vibration techniques. Emphasizing that all should Create an Action Plan. Written material given at graduation.   AED/CPR: - Group verbal and written instruction with the use of models to demonstrate the basic use of the AED with the basic ABC's of resuscitation.    Anatomy and Cardiac  Procedures: - Group verbal and visual presentation and models provide information about basic cardiac anatomy and function. Reviews the testing methods done to diagnose heart disease and the outcomes of the test results. Describes the treatment choices: Medical Management, Angioplasty, or Coronary Bypass Surgery for treating various heart conditions including Myocardial Infarction, Angina, Valve Disease, and Cardiac Arrhythmias.  Written material given at graduation.   Medication Safety: - Group verbal and visual instruction to review commonly prescribed medications for heart and lung disease. Reviews the medication, class of the drug, and side effects. Includes the steps to properly store meds and maintain the prescription regimen.  Written material given at graduation.   Other: -Provides group and verbal instruction on various topics (see  comments)   Knowledge Questionnaire Score:  Knowledge Questionnaire Score - 05/11/22 1530       Knowledge Questionnaire Score   Pre Score 14/18              Core Components/Risk Factors/Patient Goals at Admission:  Personal Goals and Risk Factors at Admission - 05/11/22 1541       Core Components/Risk Factors/Patient Goals on Admission    Weight Management Yes;Weight Loss    Intervention Weight Management: Develop a combined nutrition and exercise program designed to reach desired caloric intake, while maintaining appropriate intake of nutrient and fiber, sodium and fats, and appropriate energy expenditure required for the weight goal.;Weight Management: Provide education and appropriate resources to help participant work on and attain dietary goals.;Weight Management/Obesity: Establish reasonable short term and long term weight goals.    Admit Weight 132 lb 14.4 oz (60.3 kg)    Goal Weight: Short Term 130 lb (59 kg)    Goal Weight: Long Term 125 lb (56.7 kg)    Expected Outcomes Short Term: Continue to assess and modify interventions until short term weight is achieved;Long Term: Adherence to nutrition and physical activity/exercise program aimed toward attainment of established weight goal;Weight Loss: Understanding of general recommendations for a balanced deficit meal plan, which promotes 1-2 lb weight loss per week and includes a negative energy balance of [phone] kcal/d;Understanding recommendations for meals to include 15-35% energy as protein, 25-35% energy from fat, 35-60% energy from carbohydrates, less than 200mg  of dietary cholesterol, 20-35 gm of total fiber daily;Understanding of distribution of calorie intake throughout the day with the consumption of 4-5 meals/snacks    Tobacco Cessation Yes    Number of packs per day 1/4    Intervention Assist the participant in steps to quit. Provide individualized education and counseling about committing to Tobacco Cessation, relapse  prevention, and pharmacological support that can be provided by physician.;Advice worker, assist with locating and accessing local/national Quit Smoking programs, and support quit date choice.    Expected Outcomes Short Term: Will quit all tobacco product use, adhering to prevention of relapse plan.;Short Term: Will demonstrate readiness to quit, by selecting a quit date.;Long Term: Complete abstinence from all tobacco products for at least 12 months from quit date.    Improve shortness of breath with ADL's Yes    Intervention Provide education, individualized exercise plan and daily activity instruction to help decrease symptoms of SOB with activities of daily living.    Expected Outcomes Short Term: Improve cardiorespiratory fitness to achieve a reduction of symptoms when performing ADLs;Long Term: Be able to perform more ADLs without symptoms or delay the onset of symptoms    Hypertension Yes    Intervention Provide education on lifestyle modifcations including regular physical activity/exercise, weight management,  moderate sodium restriction and increased consumption of fresh fruit, vegetables, and low fat dairy, alcohol moderation, and smoking cessation.;Monitor prescription use compliance.    Expected Outcomes Short Term: Continued assessment and intervention until BP is < 140/17mm HG in hypertensive participants. < 130/71mm HG in hypertensive participants with diabetes, heart failure or chronic kidney disease.;Long Term: Maintenance of blood pressure at goal levels.    Lipids Yes    Intervention Provide education and support for participant on nutrition & aerobic/resistive exercise along with prescribed medications to achieve LDL 70mg , HDL >40mg .    Expected Outcomes Short Term: Participant states understanding of desired cholesterol values and is compliant with medications prescribed. Participant is following exercise prescription and nutrition guidelines.;Long Term: Cholesterol  controlled with medications as prescribed, with individualized exercise RX and with personalized nutrition plan. Value goals: LDL < 70mg , HDL > 40 mg.             Education:Diabetes - Individual verbal and written instruction to review signs/symptoms of diabetes, desired ranges of glucose level fasting, after meals and with exercise. Acknowledge that pre and post exercise glucose checks will be done for 3 sessions at entry of program.   Know Your Numbers and Heart Failure: - Group verbal and visual instruction to discuss disease risk factors for cardiac and pulmonary disease and treatment options.  Reviews associated critical values for Overweight/Obesity, Hypertension, Cholesterol, and Diabetes.  Discusses basics of heart failure: signs/symptoms and treatments.  Introduces Heart Failure Zone chart for action plan for heart failure.  Written material given at graduation.   Core Components/Risk Factors/Patient Goals Review:   Goals and Risk Factor Review     Row Name 06/23/22 1615             Core Components/Risk Factors/Patient Goals Review   Personal Goals Review Improve shortness of breath with ADL's       Review Spoke to patient about their shortness of breath and what they can do to improve. Patient has been informed of breathing techniques when starting the program. Patient is informed to tell staff if they have had any med changes and that certain meds they are taking or not taking can be causing shortness of breath.       Expected Outcomes Short: Attend [facility] regularly to improve shortness of breath with ADL's. Long: maintain independence with ADL's                Core Components/Risk Factors/Patient Goals at Discharge (Final Review):   Goals and Risk Factor Review - 06/23/22 1615       Core Components/Risk Factors/Patient Goals Review   Personal Goals Review Improve shortness of breath with ADL's    Review Spoke to patient about their shortness of breath and what  they can do to improve. Patient has been informed of breathing techniques when starting the program. Patient is informed to tell staff if they have had any med changes and that certain meds they are taking or not taking can be causing shortness of breath.    Expected Outcomes Short: Attend [facility] regularly to improve shortness of breath with ADL's. Long: maintain independence with ADL's             ITP Comments:  ITP Comments     Row Name 05/05/22 1246 05/11/22 1523 05/18/22 1613 06/01/22 1052 06/21/22 1458   ITP Comments Virtual Visit completed. Patient informed on EP and RD appointment and 6 Minute walk test. Patient also informed of patient health questionnaires on My Chart.  Patient Verbalizes understanding. Visit diagnosis can be found in [facility] 01/05/2022. Completed 6MWT and gym orientation. Initial ITP created and sent for review to Dr. Ottie Glazier, Medical Director. First full day of exercise!  Patient was oriented to gym and equipment including functions, settings, policies, and procedures.  Patient's individual exercise prescription and treatment plan were reviewed.  All starting workloads were established based on the results of the 6 minute walk test done at initial orientation visit.  The plan for exercise progression was also introduced and progression will be customized based on patient's performance and goals. 30 Day review completed. Medical Director ITP review done, changes made as directed, and signed approval by Medical Director.    new to program We called Tranise to check in with her as she has not attended rehab since 06/09/2022. She reports not feeling well last week, but she is feeling better. We reminded Kyarra to call and let us know if she is going to miss any of her scheduled sessions. She stated that she plans to return to pulmonary rehab on 06/23/2022.    [place] Name 06/29/22 1018 07/27/22 1431 08/10/22 1206 08/24/22 0918     ITP Comments 30 Day review completed. Medical  Director ITP review done, changes made as directed, and signed approval by Medical Director. 30 day review completed. ITP sent to Dr. Zetta Bills, Medical Director of  Pulmonary Rehab. Continue with ITP unless changes are made by physician.  Pt had fallen at home at end of January.  She was scheduled to return on 07/20/22 but has yet to return to rehab. Spoke with patient again to follow up, she is still recovering from her fall weeks ago and wants to talk to her PCP about resuming our program. She wants to come back but wants to talk to MD first. Appt on 3/14- patient will call us after then to let us know. 30 Day review completed. Medical Director ITP review done, changes made as directed, and signed approval by Medical Director.   remains out             Comments:

## 2022-08-24 NOTE — Telephone Encounter (Signed)
Attempted to call patient again to follow up on rehab. Left voicemail asking for callback.

## 2022-08-30 NOTE — Telephone Encounter (Signed)
No response back from patient after several attempts. Will send letter at this time.

## 2022-09-07 ENCOUNTER — Encounter: Payer: Medicaid Other | Attending: Internal Medicine

## 2022-09-07 DIAGNOSIS — J449 Chronic obstructive pulmonary disease, unspecified: Secondary | ICD-10-CM | POA: Insufficient documentation

## 2022-09-19 DIAGNOSIS — J449 Chronic obstructive pulmonary disease, unspecified: Secondary | ICD-10-CM

## 2022-09-19 NOTE — Progress Notes (Signed)
Pulmonary Individual Treatment Plan  Patient Details  Name: Terri Burns MRN: [id number] Date of Birth: May 30, 1951 Referring Provider:   Flowsheet Row Pulmonary Rehab from 05/11/2022 in [facility] Cardiac and Pulmonary Rehab  Referring Provider Dr. Sumner Boast, MD       Initial Encounter Date:  Flowsheet Row Pulmonary Rehab from 05/11/2022 in [facility] Cardiac and Pulmonary Rehab  Date 05/11/22       Visit Diagnosis: Chronic obstructive pulmonary disease, unspecified COPD type  Patient's Home Medications on Admission:  Current Outpatient Medications:    ACCU-CHEK GUIDE test strip, USE AS DIRECTED TO Check blood glucose, Disp: , Rfl:    Accu-Chek Softclix Lancets lancets, as directed. (Patient not taking: Reported on 05/05/2022), Disp: , Rfl:    albuterol (PROVENTIL) (2.5 MG/3ML) 0.083% nebulizer solution, Take 3 mLs (2.5 mg total) by nebulization 4 (four) times daily., Disp: 75 mL, Rfl: 12   ALPRAZolam (XANAX) 0.25 MG tablet, Take 0.25 mg by mouth 2 (two) times daily as needed., Disp: , Rfl:    amLODipine (NORVASC) 10 MG tablet, Take 1 tablet (10 mg total) by mouth daily., Disp: 30 tablet, Rfl: 11   aspirin 81 MG EC tablet, TAKE 1 TABLET BY MOUTH EVERY DAY, Disp: 90 tablet, Rfl: 0   atorvastatin (LIPITOR) 20 MG tablet, Take 20 mg by mouth every morning. (Patient not taking: Reported on 05/05/2022), Disp: , Rfl:    atorvastatin (LIPITOR) 40 MG tablet, Take 2 tablets (80 mg total) by mouth daily., Disp: 180 tablet, Rfl: 1   atorvastatin (LIPITOR) 80 MG tablet, , Disp: , Rfl:    azithromycin (ZITHROMAX) 250 MG tablet, Take 250 mg by mouth daily., Disp: , Rfl:    Blood Glucose Monitoring Suppl (ACCU-CHEK GUIDE) w/Device KIT, USE AS DIRECTED TO Check blood glucose, Disp: , Rfl:    BRILINTA 90 MG TABS tablet, TAKE ONE TABLET BY MOUTH EVERY MORNING and TAKE ONE TABLET BY MOUTH EVERYDAY AT BEDTIME, Disp: 60 tablet, Rfl: 0   citalopram (CELEXA) 20 MG tablet, TAKE 1 AND 1/2 TABLETS BY MOUTH  EVERY EVENING, Disp: 90 tablet, Rfl: 0   fluticasone (FLONASE) 50 MCG/ACT nasal spray, PLACE TWO SPRAYS into BOTH nostrils DAILY, Disp: 48 g, Rfl: 1   Fluticasone-Umeclidin-Vilant (TRELEGY ELLIPTA) 100-62.5-25 MCG/ACT AEPB, Inhale 1 puff into the lungs daily., Disp: 1 each, Rfl: 11   ipratropium-albuterol (DUONEB) 0.5-2.5 (3) MG/3ML SOLN, Take by nebulization., Disp: , Rfl:    isosorbide mononitrate (IMDUR) 30 MG 24 hr tablet, TAKE 1/2 TABLET BY MOUTH ONCE DAILY, Disp: 15 tablet, Rfl: 0   loratadine (ALLERGY RELIEF) 10 MG tablet, Take 1 tablet (10 mg total) by mouth daily., Disp: 90 tablet, Rfl: 1   losartan (COZAAR) 100 MG tablet, Take 1 tablet (100 mg total) by mouth every evening., Disp: 90 tablet, Rfl: 1   metoprolol succinate (TOPROL-XL) 25 MG 24 hr tablet, Take 1 tablet (25 mg total) by mouth every evening., Disp: 90 tablet, Rfl: 1   NARCAN 4 MG/0.1ML LIQD nasal spray kit, 1 spray as needed. As needed in case of overdose (Patient not taking: Reported on 05/05/2022), Disp: , Rfl:    pantoprazole (PROTONIX) 40 MG tablet, Take 1 tablet (40 mg total) by mouth every evening., Disp: 90 tablet, Rfl: 1   predniSONE (DELTASONE) 10 MG tablet, 60 mg tomorrow, taper by 10 mg daily until complete (Patient not taking: Reported on 05/05/2022), Disp: 21 tablet, Rfl: 0   predniSONE (DELTASONE) 20 MG tablet, Take 20 mg by mouth 2 (two) times  daily. 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    Hyperlipidemia    Hypertension    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    Tobacco Use: Social History   Tobacco Use  Smoking Status Every Day   Packs/day: 0.25   Years: 50.00   Additional pack years: 0.00   Total pack years: 12.50   Types: Cigarettes   Start date: 12/10/1973  Smokeless Tobacco Never  Tobacco Comments   1-2 ciggs per day - 04/13/2021; has a hx of 2 PPD x 10 years     Labs: Review Flowsheet  More data exists      Latest Ref Rng & Units 02/21/2020 02/02/2021 02/19/2021 02/25/2021 01/05/2022  Labs for ITP Cardiac and Pulmonary Rehab  Cholestrol <200 mg/dL 130  - - - -  LDL (calc) mg/dL (calc) 61  - - - -  Direct LDL 0 - 99 mg/dL - - - 86.5  -  HDL-C > OR = 50 mg/dL 784  - - - -  Trlycerides <150 mg/dL 39  - - - -  PH, Arterial 7.350 - 7.450 - 7.36  7.24  - - -  PCO2 arterial 32.0 - 48.0 mmHg - 44  60  - - -  Bicarbonate 20.0 - 28.0 mmol/L - 24.9  25.7  22.7  - 26.2   Acid-base deficit 0.0 - 2.0 mmol/L - 0.8  2.7  3.6  - 2.8   O2 Saturation % - 97.6  99.7  88.0  - 86.1      Pulmonary Assessment Scores:  Pulmonary Assessment Scores     Row Name 05/11/22 1542         ADL UCSD   ADL Phase Entry     SOB Score total 48     Rest 0     Walk 1     Stairs 5     Bath 2     Dress 2     Shop 3       CAT Score   CAT Score 17       mMRC Score   mMRC Score 1              UCSD: Self-administered rating of dyspnea associated with activities of daily living (ADLs) 6-point scale (0 = "not at all" to 5 = "maximal or unable to do because of breathlessness")  Scoring Scores range from 0 to 120.  Minimally important difference is 5 units  CAT: CAT can identify the health impairment of COPD patients and is better correlated with disease progression.  CAT has a scoring range of zero to 40. The CAT score is classified into four groups of low (less than 10), medium (10 - 20), high  (21-30) and very high (31-40) based on the impact level of disease on health status. A  CAT score over 10 suggests significant symptoms.  A worsening CAT score could be explained by an exacerbation, poor medication adherence, poor inhaler technique, or progression of COPD or comorbid conditions.  CAT MCID is 2 points  mMRC: mMRC (Modified Medical Research Council) Dyspnea Scale is used to assess the degree of baseline functional disability in patients of respiratory disease due to dyspnea. No minimal important difference is established. A decrease in score of 1 point or greater is considered a positive change.   Pulmonary Function Assessment:  Pulmonary Function Assessment - 05/05/22 1247       Breath   Shortness of Breath Yes;Limiting activity;Panic with Shortness of Breath             Exercise Target Goals: Exercise Program Goal: Individual exercise prescription set using results from initial 6 min walk test and THRR while considering  patient's activity barriers and safety.   Exercise Prescription Goal: Initial exercise prescription builds to 30-45 minutes a day of aerobic activity, 2-3 days per week.  Home exercise guidelines will be given to patient during program as part of exercise prescription that the participant will acknowledge.  Education: Aerobic Exercise: - Group verbal and visual presentation on the components of exercise prescription. Introduces F.I.T.T principle from ACSM for exercise prescriptions.  Reviews F.I.T.T. principles of aerobic exercise including progression. Written material given at graduation. Flowsheet Row Pulmonary Rehab from 05/11/2022 in [facility] Cardiac and Pulmonary Rehab  Education need identified 05/11/22       Education: Resistance Exercise: - Group verbal and visual presentation on the components of exercise prescription. Introduces F.I.T.T principle from ACSM for exercise prescriptions  Reviews F.I.T.T. principles of resistance exercise  including progression. Written material given at graduation.    Education: Exercise & Equipment Safety: - Individual verbal instruction and demonstration of equipment use and safety with use of the equipment. Flowsheet Row Pulmonary Rehab from 05/11/2022 in [facility] Cardiac and Pulmonary Rehab  Date 05/05/22  Educator [facility]  Instruction Review Code 1- Verbalizes Understanding       Education: Exercise Physiology & General Exercise Guidelines: - Group verbal and written instruction with models to review the exercise physiology of the cardiovascular system and associated critical values. Provides general exercise guidelines with specific guidelines to those with heart or lung disease.    Education: Flexibility, Balance, Mind/Body Relaxation: - Group verbal and visual presentation with interactive activity on the components of exercise prescription. Introduces F.I.T.T principle from ACSM for exercise prescriptions. Reviews F.I.T.T. principles of flexibility and balance exercise training including progression. Also discusses the mind body connection.  Reviews various relaxation techniques to help reduce and manage stress (i.e. Deep breathing, progressive muscle relaxation, and visualization). Balance handout provided to take home. Written material given at graduation.   Activity Barriers & Risk Stratification:  Activity Barriers & Cardiac Risk Stratification - 05/11/22 1551       Activity Barriers & Cardiac Risk Stratification   Activity Barriers Neck/Spine Problems;Deconditioning;Muscular Weakness;Shortness of Breath             6 Minute Walk:  6 Minute Walk     Row Name 05/11/22 1548         6 Minute Walk   Phase Initial     Distance 655 feet     Walk Time 5.85 minutes     # of Rest Breaks 1     MPH 1.27     METS 1.63     RPE 11     Perceived Dyspnea  1  VO2 Peak 5.72     Symptoms Yes (comment)     Comments Neck/Shoulder pain (5/10)     Resting HR 71 bpm     Resting BP  128/76     Resting Oxygen Saturation  98 %     Exercise Oxygen Saturation  during 6 min walk 89 %     Max Ex. HR 91 bpm     Max Ex. BP 148/88     2 Minute Post BP 136/82       Interval HR   1 Minute HR 80     2 Minute HR 85     3 Minute HR 87     4 Minute HR 89     5 Minute HR 91     6 Minute HR 88     2 Minute Post HR 79     Interval Heart Rate? Yes       Interval Oxygen   Interval Oxygen? Yes     Baseline Oxygen Saturation % 98 %     1 Minute Oxygen Saturation % 92 %     1 Minute Liters of Oxygen 0 L  RA     2 Minute Oxygen Saturation % 92 %     2 Minute Liters of Oxygen 0 L  RA     3 Minute Oxygen Saturation % 92 %     3 Minute Liters of Oxygen 0 L  RA     4 Minute Oxygen Saturation % 90 %     4 Minute Liters of Oxygen 0 L  RA     5 Minute Oxygen Saturation % 90 %     5 Minute Liters of Oxygen 0 L  RA     6 Minute Oxygen Saturation % 89 %     6 Minute Liters of Oxygen 0 L  RA     2 Minute Post Oxygen Saturation % 97 %     2 Minute Post Liters of Oxygen 0 L  RA             Oxygen Initial Assessment:  Oxygen Initial Assessment - 05/05/22 1247       Home Oxygen   Home Oxygen Device None    Sleep Oxygen Prescription None    Home Exercise Oxygen Prescription None    Home Resting Oxygen Prescription None      Initial 6 min Walk   Oxygen Used None      Program Oxygen Prescription   Program Oxygen Prescription None      Intervention   Short Term Goals To learn and exhibit compliance with exercise, home and travel O2 prescription;To learn and understand importance of maintaining oxygen saturations>88%;To learn and demonstrate proper use of respiratory medications;To learn and demonstrate proper pursed lip breathing techniques or other breathing techniques. ;To learn and understand importance of monitoring SPO2 with pulse oximeter and demonstrate accurate use of the pulse oximeter.    Long  Term Goals Exhibits compliance with exercise, home  and travel O2  prescription;Maintenance of O2 saturations>88%;Compliance with respiratory medication;Demonstrates proper use of MDI's;Exhibits proper breathing techniques, such as pursed lip breathing or other method taught during program session;Verbalizes importance of monitoring SPO2 with pulse oximeter and return demonstration             Oxygen Re-Evaluation:  Oxygen Re-Evaluation     Row Name 05/18/22 1614 06/23/22 1612           Program Oxygen Prescription  Program Oxygen Prescription -- None        Home Oxygen   Home Oxygen Device -- None      Sleep Oxygen Prescription -- None      Home Exercise Oxygen Prescription -- None      Home Resting Oxygen Prescription -- None        Goals/Expected Outcomes   Short Term Goals To learn and exhibit compliance with exercise, home and travel O2 prescription;To learn and understand importance of maintaining oxygen saturations>88%;To learn and demonstrate proper use of respiratory medications;To learn and demonstrate proper pursed lip breathing techniques or other breathing techniques. ;To learn and understand importance of monitoring SPO2 with pulse oximeter and demonstrate accurate use of the pulse oximeter. To learn and understand importance of maintaining oxygen saturations>88%;To learn and understand importance of monitoring SPO2 with pulse oximeter and demonstrate accurate use of the pulse oximeter.      Long  Term Goals Exhibits compliance with exercise, home  and travel O2 prescription;Maintenance of O2 saturations>88%;Compliance with respiratory medication;Demonstrates proper use of MDI's;Exhibits proper breathing techniques, such as pursed lip breathing or other method taught during program session;Verbalizes importance of monitoring SPO2 with pulse oximeter and return demonstration Maintenance of O2 saturations>88%      Comments Reviewed PLB technique with pt.  Talked about how it works and it's importance in maintaining their exercise saturations.  Makaiah has been able to practice PLB at home. She is doing well in the program despite her mobility.She has a pulse oximeter to check her/his oxygen saturation at home. Informed and explained why it is important to have one. Reviewed that oxygen saturations should be 88 percent and above. Patient verbalizes understanding.      Goals/Expected Outcomes Short: Become more profiecient at using PLB.   Long: Become independent at using PLB. Short: monitor oxygen at home with exertion. Long: maintain oxygen saturations above 88 percent independently.               Oxygen Discharge (Final Oxygen Re-Evaluation):  Oxygen Re-Evaluation - 06/23/22 1612       Program Oxygen Prescription   Program Oxygen Prescription None      Home Oxygen   Home Oxygen Device None    Sleep Oxygen Prescription None    Home Exercise Oxygen Prescription None    Home Resting Oxygen Prescription None      Goals/Expected Outcomes   Short Term Goals To learn and understand importance of maintaining oxygen saturations>88%;To learn and understand importance of monitoring SPO2 with pulse oximeter and demonstrate accurate use of the pulse oximeter.    Long  Term Goals Maintenance of O2 saturations>88%    Comments Allianna has been able to practice PLB at home. She is doing well in the program despite her mobility.She has a pulse oximeter to check her/his oxygen saturation at home. Informed and explained why it is important to have one. Reviewed that oxygen saturations should be 88 percent and above. Patient verbalizes understanding.    Goals/Expected Outcomes Short: monitor oxygen at home with exertion. Long: maintain oxygen saturations above 88 percent independently.             Initial Exercise Prescription:  Initial Exercise Prescription - 05/11/22 1600       Date of Initial Exercise RX and Referring Provider   Date 05/11/22    Referring Provider Dr. Sumner Boast, MD      Oxygen   Oxygen Continuous     Maintain Oxygen Saturation 88% or higher  Recumbant Bike   Level 1    RPM 50    Watts 10    Minutes 15    METs 1.63      NuStep   Level 1    SPM 80    Minutes 15    METs 1.63      Biostep-RELP   Level 1    SPM 60    Minutes 15    METs 1.63      Track   Laps 15    Minutes 15    METs 1.82      Prescription Details   Frequency (times per week) 2    Duration Progress to 30 minutes of continuous aerobic without signs/symptoms of physical distress      Intensity   THRR 40-80% of Max Heartrate 102-134    Ratings of Perceived Exertion 11-13    Perceived Dyspnea 0-4      Progression   Progression Continue to progress workloads to maintain intensity without signs/symptoms of physical distress.      Resistance Training   Training Prescription Yes    Weight 2 lb    Reps 10-15             Perform Capillary Blood Glucose checks as needed.  Exercise Prescription Changes:   Exercise Prescription Changes     Row Name 05/11/22 1600 05/31/22 1400 06/13/22 1300 06/28/22 1300 07/11/22 1500     Response to Exercise   Blood Pressure (Admit) 128/76 124/72 122/78 126/64 118/62   Blood Pressure (Exercise) 148/88 140/86 138/62 122/62 148/72   Blood Pressure (Exit) 136/82 110/60 118/70 122/70 128/64   Heart Rate (Admit) 71 bpm 79 bpm 82 bpm 80 bpm 77 bpm   Heart Rate (Exercise) 91 bpm 101 bpm 91 bpm 91 bpm 99 bpm   Heart Rate (Exit) 79 bpm 81 bpm 79 bpm 73 bpm 83 bpm   Oxygen Saturation (Admit) 98 % 97 % 96 % 94 % 94 %   Oxygen Saturation (Exercise) 89 % 90 % 89 % 91 % 93 %   Oxygen Saturation (Exit) 97 % 96 % 95 % 95 % 94 %   Rating of Perceived Exertion (Exercise) 11 15 13 12 15    Perceived Dyspnea (Exercise) 1 2 1  0 2   Symptoms Neck/shoulder pain (5/10) -- SOB, fatigue, hip pain none SOB   Comments Results First three sessions in rehab -- -- --   Duration -- Progress to 30 minutes of  aerobic without signs/symptoms of physical distress Progress to 30 minutes of   aerobic without signs/symptoms of physical distress Progress to 30 minutes of  aerobic without signs/symptoms of physical distress Continue with 30 min of aerobic exercise without signs/symptoms of physical distress.   Intensity -- THRR unchanged THRR unchanged THRR unchanged THRR unchanged     Progression   Progression -- Continue to progress workloads to maintain intensity without signs/symptoms of physical distress. Continue to progress workloads to maintain intensity without signs/symptoms of physical distress. Continue to progress workloads to maintain intensity without signs/symptoms of physical distress. Continue to progress workloads to maintain intensity without signs/symptoms of physical distress.   Average METs -- 1.69 1.88 2 1.84     Resistance Training   Training Prescription -- Yes Yes Yes Yes   Weight -- 2 lb 2 lb 2 lb 2 lb   Reps -- 10-15 10-15 10-15 10-15     Interval Training   Interval Training -- No No No No  Recumbant Bike   Level -- -- 1 -- --   Watts -- -- 10 -- --   Minutes -- -- 15 -- --   METs -- -- 2.53 -- --     NuStep   Level -- 3 3 2 3    Minutes -- 15 15 15 15    METs -- 1.9 2 2  1.8     Biostep-RELP   Level -- 1 1 1  --   Minutes -- 15 15 15  --   METs -- 1 2 2  --     Track   Laps -- 17 -- -- 17   Minutes -- 15 -- -- 15   METs -- 1.92 -- -- 1.92     Oxygen   Maintain Oxygen Saturation -- 88% or higher 88% or higher 88% or higher 88% or higher            Exercise Comments:   Exercise Comments     Row Name 05/18/22 1613           Exercise Comments First full day of exercise!  Patient was oriented to gym and equipment including functions, settings, policies, and procedures.  Patient's individual exercise prescription and treatment plan were reviewed.  All starting workloads were established based on the results of the 6 minute walk test done at initial orientation visit.  The plan for exercise progression was also introduced and  progression will be customized based on patient's performance and goals.                Exercise Goals and Review:   Exercise Goals     Row Name 05/11/22 1551             Exercise Goals   Increase Physical Activity Yes       Intervention Provide advice, education, support and counseling about physical activity/exercise needs.;Develop an individualized exercise prescription for aerobic and resistive training based on initial evaluation findings, risk stratification, comorbidities and participant's personal goals.       Expected Outcomes Short Term: Attend rehab on a regular basis to increase amount of physical activity.;Long Term: Exercising regularly at least 3-5 days a week.;Long Term: Add in home exercise to make exercise part of routine and to increase amount of physical activity.       Increase Strength and Stamina Yes       Intervention Provide advice, education, support and counseling about physical activity/exercise needs.;Develop an individualized exercise prescription for aerobic and resistive training based on initial evaluation findings, risk stratification, comorbidities and participant's personal goals.       Expected Outcomes Short Term: Increase workloads from initial exercise prescription for resistance, speed, and METs.;Short Term: Perform resistance training exercises routinely during rehab and add in resistance training at home;Long Term: Improve cardiorespiratory fitness, muscular endurance and strength as measured by increased METs and functional capacity ( )       Able to understand and use rate of perceived exertion (RPE) scale Yes       Intervention Provide education and explanation on how to use RPE scale       Expected Outcomes Short Term: Able to use RPE daily in rehab to express subjective intensity level;Long Term:  Able to use RPE to guide intensity level when exercising independently       Able to understand and use Dyspnea scale Yes       Intervention  Provide education and explanation on how to use Dyspnea scale  Expected Outcomes Short Term: Able to use Dyspnea scale daily in rehab to express subjective sense of shortness of breath during exertion;Long Term: Able to use Dyspnea scale to guide intensity level when exercising independently       Knowledge and understanding of Target Heart Rate Range (THRR) Yes       Intervention Provide education and explanation of THRR including how the numbers were predicted and where they are located for reference       Expected Outcomes Long Term: Able to use THRR to govern intensity when exercising independently;Short Term: Able to state/look up THRR;Short Term: Able to use daily as guideline for intensity in rehab       Able to check pulse independently Yes       Intervention Provide education and demonstration on how to check pulse in carotid and radial arteries.;Review the importance of being able to check your own pulse for safety during independent exercise       Expected Outcomes Short Term: Able to explain why pulse checking is important during independent exercise;Long Term: Able to check pulse independently and accurately       Understanding of Exercise Prescription Yes       Intervention Provide education, explanation, and written materials on patient's individual exercise prescription       Expected Outcomes Short Term: Able to explain program exercise prescription;Long Term: Able to explain home exercise prescription to exercise independently                Exercise Goals Re-Evaluation :  Exercise Goals Re-Evaluation     Row Name 05/18/22 1613 05/31/22 1502 06/13/22 1336 06/28/22 1331 07/11/22 1516     Exercise Goal Re-Evaluation   Exercise Goals Review Increase Physical Activity;Able to understand and use rate of perceived exertion (RPE) scale;Understanding of Exercise Prescription;Knowledge and understanding of Target Heart Rate Range (THRR);Increase Strength and Stamina;Able to  check pulse independently Increase Physical Activity;Increase Strength and Stamina;Understanding of Exercise Prescription Increase Physical Activity;Increase Strength and Stamina;Understanding of Exercise Prescription Increase Physical Activity;Increase Strength and Stamina;Understanding of Exercise Prescription Increase Physical Activity;Increase Strength and Stamina;Understanding of Exercise Prescription   Comments Reviewed RPE scale, THR and program prescription with pt today.  Pt voiced understanding and was given a copy of goals to take home. Kilynn is off to a good start in the program. She had an average MET level of 1.69 METs during her first three sessions. She also was able to improve to level 3 on the T4. She consistently walked 17 laps on the track as well. We will continue to monitor her progress in the program. Marna is doing well in rehab. She is limited with her walking and some seated machines due to hip pain.  She has stayed consistent at level 3 on the T4 Nustep but would benefit from increasing to level 2 on the Biostep. She is not quite hitting her THR but may to better if she is able to walk more. We will continue to monitor. Karlisa has only attended rehab once the last review due to being out sick. She is still limited on her walking, but has done well on the T4 and biostep and has consistantly worked at 2 METs. We will encourage her to increase her workloads on the seated machines. We will continue to monitor her progress in the program. Rie has been sporadic with attendance with rehab, she had completed 2 sessions since last review. Staff will review attendance policy with patient to make sure she  maintains good attendance to yield best results. She was able to walk a total of 17 laps  and work at level 3 on the T4 Nustep. She is not quite hitting her THR and will encourage an increase in workloads as well. We will continue to monitor   Expected Outcomes Short: Use RPE daily to regulate  intensity.  Long: Follow program prescription in THR. Short: Continue to follow current exercise prescription. Long: Continue to improve strength and stamina. Short: Add more walking as tolerated with hip pain Long: Continue to increase overall MET level Short: Return to regular attendance in rehab, and increase workloads on seated machines. Long: Continue to improve strength and stamina. Short: Maintain good attendance and increase laps on track Long: Continue to increase overall MET level and stamina    Row Name 07/27/22 1409 08/08/22 1542 08/23/22 1402 09/05/22 1319       Exercise Goal Re-Evaluation   Exercise Goals Review Increase Physical Activity;Increase Strength and Stamina;Understanding of Exercise Prescription Increase Physical Activity;Increase Strength and Stamina;Understanding of Exercise Prescription Increase Physical Activity;Increase Strength and Stamina;Understanding of Exercise Prescription Increase Physical Activity;Increase Strength and Stamina;Understanding of Exercise Prescription    Comments Cherene has not attended rehab since the last review. She informed us that she has had some appointments that have conflicted with her rehab sessions. She also recently fell and has been sore. We will contact patient to see when she plans to return to the program. We will continue to monitor her progress once she returns to the program. Patient has not been here since last review. We have attempted to follow up with patient on her return back. If no response by patient soon, we will send letter for potential discharge. Mattalyn has not attended rehab since 06/30/2022. She is still recovering from her fall weeks ago and wants to talk to her PCP about resuming our program. She wants to come back but wants to talk to MD first. She had an appt for 03/14 and stated that she will keep [place] updated on her return to the program. Edgar has not returned to rehab or called [place] back on an update. We have sent her a  letter at this time with discharge date if we don't hear back from her.    Expected Outcomes Short: Return to rehab. Long: Continue to improve strength and stamina. Short: Return to rehab Long: Continue to increase overall MET level and stamina Short: Return to rehab. Long: Continue to improve strength and stamina. Short: Return to rehab Long: [facility] program             Discharge Exercise Prescription (Final Exercise Prescription Changes):  Exercise Prescription Changes - 07/11/22 1500       Response to Exercise   Blood Pressure (Admit) 118/62    Blood Pressure (Exercise) 148/72    Blood Pressure (Exit) 128/64    Heart Rate (Admit) 77 bpm    Heart Rate (Exercise) 99 bpm    Heart Rate (Exit) 83 bpm    Oxygen Saturation (Admit) 94 %    Oxygen Saturation (Exercise) 93 %    Oxygen Saturation (Exit) 94 %    Rating of Perceived Exertion (Exercise) 15    Perceived Dyspnea (Exercise) 2    Symptoms SOB    Duration Continue with 30 min of aerobic exercise without signs/symptoms of physical distress.    Intensity THRR unchanged      Progression   Progression Continue to progress workloads to maintain intensity without signs/symptoms of physical  distress.    Average METs 1.84      Resistance Training   Training Prescription Yes    Weight 2 lb    Reps 10-15      Interval Training   Interval Training No      NuStep   Level 3    Minutes 15    METs 1.8      Track   Laps 17    Minutes 15    METs 1.92      Oxygen   Maintain Oxygen Saturation 88% or higher             Nutrition:  Target Goals: Understanding of nutrition guidelines, daily intake of sodium 1500mg , cholesterol 200mg , calories 30% from fat and 7% or less from saturated fats, daily to have 5 or more servings of fruits and vegetables.  Education: All About Nutrition: -Group instruction provided by verbal, written material, interactive activities, discussions, models, and posters to present general guidelines  for heart healthy nutrition including fat, fiber, MyPlate, the role of sodium in heart healthy nutrition, utilization of the nutrition label, and utilization of this knowledge for meal planning. Follow up email sent as well. Written material given at graduation. Flowsheet Row Pulmonary Rehab from 05/11/2022 in [facility] Card[facility] Pul[facility] Re[facility]  Education need identified 05/11/22       Biometrics:  Pre Biometrics - 05/11/22 1552       Pre Biometrics   Height 4' 10.5" (1.486 m)    Weight 132 lb 14.4 oz (60.3 kg)    Waist Circumference 36.5 inches    Hip Circumference 40.5 inches    Waist to Hip Ratio 0.9 %    BMI (Calculated) 27.3    Single Leg Stand 1.5 seconds   L             Nutrition Therapy Plan and Nutrition Goals:  Nutrition Therapy & Goals - 05/11/22 1505       Nutrition Therapy   Diet Heart healthy, low Na, pulmonary MNT.    Drug/Food Interactions Statins/Certain Fruits    Protein (specify units) 60-70g    Fiber 25 grams    Whole Grain Foods 3 servings    Saturated Fats 12 max. grams    Fruits and Vegetables 5 servings/day    Sodium 2 grams      Personal Nutrition Goals   Nutrition Goal ST: eat a protein source at each meal/snack, consider including ensure into diet LT: meet protein/energy needs, limit saturated fat <12g/day, limit sodium <2g/day    Comments 72 y.o F admitted to pulmonary rehab for COPD. PMHx includes CAD, HFpEF, HTN, HLD, tobacco use. Relevant medications lipitor, clelexa, trelegy, protonix, prednisone, tramadol, xanax. B: egg and sometimes grits L: fruit cup and some cheese - sometimes also tomatoes D: fish or chicken with cabbage (stemaed or slaw), tomatoes, with a slice of bread (white bread) Drinks: water and coffee (black). She reads food labels, uses Mrs Sharilyn Sites seasoning, and general eats whole or minimally processed foods. Melanee reports hvaing issues with hypoglycemia - no hx of diabetes. Discussed heart healthy eating, pulmonary MNT, and  nutrition for maintaining a stable BG. Tayli wanted to review other protein sources besides meat as well - discussed other options as well as protein needs - she reports having ensure in the past.      Intervention Plan   Intervention Prescribe, educate and counsel regarding individualized specific dietary modifications aiming towards targeted core components such as weight, hypertension, lipid management, diabetes, heart  failure and other comorbidities.;Nutrition handout(s) given to patient.    Expected Outcomes Short Term Goal: A plan has been developed with personal nutrition goals set during dietitian appointment.;Long Term Goal: Adherence to prescribed nutrition plan.;Short Term Goal: Understand basic principles of dietary content, such as calories, fat, sodium, cholesterol and nutrients.             Nutrition Assessments:  MEDIFICTS Score Key: ?70 Need to make dietary changes  40-70 Heart Healthy Diet ? 40 Therapeutic Level Cholesterol Diet  Flowsheet Row Pulmonary Rehab from 05/11/2022 in [facility] Cardiac and Pulmonary Rehab  Picture Your Plate Total Score on Admission 70      Picture Your Plate Scores: <16 Unhealthy dietary pattern with much room for improvement. 41-50 Dietary pattern unlikely to meet recommendations for good health and room for improvement. 51-60 More healthful dietary pattern, with some room for improvement.  >60 Healthy dietary pattern, although there may be some specific behaviors that could be improved.   Nutrition Goals Re-Evaluation:  Nutrition Goals Re-Evaluation     Row Name 06/23/22 1616             Goals   Nutrition Goal Watch portion sizes       Comment Patient was informed on why it is important to maintain a balanced diet when dealing with Respiratory issues. Explained that it takes a lot of energy to breath and when they are short of breath often they will need to have a good diet to help keep up with the calories they are expending for  breathing.       Expected Outcome Short: Choose and plan snacks accordingly to patients caloric intake to improve breathing. Long: Maintain a diet independently that meets their caloric intake to aid in daily shortness of breath.                Nutrition Goals Discharge (Final Nutrition Goals Re-Evaluation):  Nutrition Goals Re-Evaluation - 06/23/22 1616       Goals   Nutrition Goal Watch portion sizes    Comment Patient was informed on why it is important to maintain a balanced diet when dealing with Respiratory issues. Explained that it takes a lot of energy to breath and when they are short of breath often they will need to have a good diet to help keep up with the calories they are expending for breathing.    Expected Outcome Short: Choose and plan snacks accordingly to patients caloric intake to improve breathing. Long: Maintain a diet independently that meets their caloric intake to aid in daily shortness of breath.             Psychosocial: Target Goals: Acknowledge presence or absence of significant depression and/or stress, maximize coping skills, provide positive support system. Participant is able to verbalize types and ability to use techniques and skills needed for reducing stress and depression.   Education: Stress, Anxiety, and Depression - Group verbal and visual presentation to define topics covered.  Reviews how body is impacted by stress, anxiety, and depression.  Also discusses healthy ways to reduce stress and to treat/manage anxiety and depression.  Written material given at graduation.   Education: Sleep Hygiene -Provides group verbal and written instruction about how sleep can affect your health.  Define sleep hygiene, discuss sleep cycles and impact of sleep habits. Review good sleep hygiene tips.    Initial Review & Psychosocial Screening:  Initial Psych Review & Screening - 05/05/22 1249  Initial Review   Current issues with None Identified       Family Dynamics   Good Support System? Yes    Comments Mirah can look to her two sons and three grandsons for support. Her friends and familiy come over often and help her through hard times.      Barriers   Psychosocial barriers to participate in program The patient should benefit from training in stress management and relaxation.      Screening Interventions   Interventions Encouraged to exercise;To provide support and resources with identified psychosocial needs;Provide feedback about the scores to participant    Expected Outcomes Short Term goal: Utilizing psychosocial counselor, staff and physician to assist with identification of specific Stressors or current issues interfering with healing process. Setting desired goal for each stressor or current issue identified.;Long Term Goal: Stressors or current issues are controlled or eliminated.;Short Term goal: Identification and review with participant of any Quality of Life or Depression concerns found by scoring the questionnaire.;Long Term goal: The participant improves quality of Life and PHQ9 Scores as seen by post scores and/or verbalization of changes             Quality of Life Scores:  Scores of 19 and below usually indicate a poorer quality of life in these areas.  A difference of  2-3 points is a clinically meaningful difference.  A difference of 2-3 points in the total score of the Quality of Life Index has been associated with significant improvement in overall quality of life, self-image, physical symptoms, and general health in studies assessing change in quality of life.  PHQ-9: Review Flowsheet  More data exists      05/11/2022 10/05/2021 07/13/2021 04/02/2021 03/30/2021  Depression screen PHQ 2/9  Decreased Interest 0 0 0 0 0 0  Down, Depressed, Hopeless 0 0 0 0 0 0  PHQ - 2 Score 0 0 0 0 0 0  Altered sleeping 0 0 0 1 0  Tired, decreased energy 1 0 0 1 0  Change in appetite 0 0 0 1 0  Feeling bad or failure about  yourself  0 0 0 0 0  Trouble concentrating 0 0 0 0 0  Moving slowly or fidgety/restless 0 0 0 0 0  Suicidal thoughts 0 0 0 0 0  PHQ-9 Score 1 0 0 3 0  Difficult doing work/chores Somewhat difficult Not difficult at all - Not difficult at all Not difficult at all   Interpretation of Total Score  Total Score Depression Severity:  1-4 = Minimal depression, 5-9 = Mild depression, 10-14 = Moderate depression, 15-19 = Moderately severe depression, 20-27 = Severe depression   Psychosocial Evaluation and Intervention:  Psychosocial Evaluation - 05/05/22 1251       Psychosocial Evaluation & Interventions   Interventions Encouraged to exercise with the program and follow exercise prescription;Relaxation education;Stress management education    Comments Conna can look to her two sons and three grandsons for support. Her friends and familiy come over often and help her through hard times.    Expected Outcomes Short: Start [facility] to help with mood. Long: Maintain a healthy mental state.    Continue Psychosocial Services  Follow up required by staff             Psychosocial Re-Evaluation:  Psychosocial Re-Evaluation     Row Name 06/23/22 1617             Psychosocial Re-Evaluation   Current issues with None Identified  Comments Patient reports no issues with their current mental states, sleep, stress, depression or anxiety. Will follow up with patient in a few weeks for any changes.       Expected Outcomes Short: Continue to exercise regularly to support mental health and notify staff of any changes. Long: maintain mental health and well being through teaching of rehab or prescribed medications independently.       Interventions Encouraged to attend Pulmonary Rehabilitation for the exercise       Continue Psychosocial Services  Follow up required by staff                Psychosocial Discharge (Final Psychosocial Re-Evaluation):  Psychosocial Re-Evaluation - 06/23/22 1617        Psychosocial Re-Evaluation   Current issues with None Identified    Comments Patient reports no issues with their current mental states, sleep, stress, depression or anxiety. Will follow up with patient in a few weeks for any changes.    Expected Outcomes Short: Continue to exercise regularly to support mental health and notify staff of any changes. Long: maintain mental health and well being through teaching of rehab or prescribed medications independently.    Interventions Encouraged to attend Pulmonary Rehabilitation for the exercise    Continue Psychosocial Services  Follow up required by staff             Education: Education Goals: Education classes will be provided on a weekly basis, covering required topics. Participant will state understanding/return demonstration of topics presented.  Learning Barriers/Preferences:  Learning Barriers/Preferences - 05/05/22 1248       Learning Barriers/Preferences   Learning Barriers None    Learning Preferences None             General Pulmonary Education Topics:  Infection Prevention: - Provides verbal and written material to individual with discussion of infection control including proper hand washing and proper equipment cleaning during exercise session. Flowsheet Row Pulmonary Rehab from 05/11/2022 in [facility] Cardiac and Pulmonary Rehab  Date 05/05/22  Educator [facility]  Instruction Review Code 1- Verbalizes Understanding       Falls Prevention: - Provides verbal and written material to individual with discussion of falls prevention and safety. Flowsheet Row Pulmonary Rehab from 05/11/2022 in [facility] Cardiac and Pulmonary Rehab  Date 05/05/22  Educator [facility]  Instruction Review Code 1- Verbalizes Understanding       Chronic Lung Disease Review: - Group verbal instruction with posters, models, PowerPoint presentations and videos,  to review new updates, new respiratory medications, new advancements in procedures and  treatments. Providing information on websites and "800" numbers for continued self-education. Includes information about supplement oxygen, available portable oxygen systems, continuous and intermittent flow rates, oxygen safety, concentrators, and Medicare reimbursement for oxygen. Explanation of Pulmonary Drugs, including class, frequency, complications, importance of spacers, rinsing mouth after steroid MDI's, and proper cleaning methods for nebulizers. Review of basic lung anatomy and physiology related to function, structure, and complications of lung disease. Review of risk factors. Discussion about methods for diagnosing sleep apnea and types of masks and machines for OSA. Includes a review of the use of types of environmental controls: home humidity, furnaces, filters, dust mite/pet prevention, HEPA vacuums. Discussion about weather changes, air quality and the benefits of nasal washing. Instruction on Warning signs, infection symptoms, calling MD promptly, preventive modes, and value of vaccinations. Review of effective airway clearance, coughing and/or vibration techniques. Emphasizing that all should Create an Action Plan. Written material given at graduation.  AED/CPR: - Group verbal and written instruction with the use of models to demonstrate the basic use of the AED with the basic ABC's of resuscitation.    Anatomy and Cardiac Procedures: - Group verbal and visual presentation and models provide information about basic cardiac anatomy and function. Reviews the testing methods done to diagnose heart disease and the outcomes of the test results. Describes the treatment choices: Medical Management, Angioplasty, or Coronary Bypass Surgery for treating various heart conditions including Myocardial Infarction, Angina, Valve Disease, and Cardiac Arrhythmias.  Written material given at graduation.   Medication Safety: - Group verbal and visual instruction to review commonly prescribed  medications for heart and lung disease. Reviews the medication, class of the drug, and side effects. Includes the steps to properly store meds and maintain the prescription regimen.  Written material given at graduation.   Other: -Provides group and verbal instruction on various topics (see comments)   Knowledge Questionnaire Score:  Knowledge Questionnaire Score - 05/11/22 1530       Knowledge Questionnaire Score   Pre Score 14/18              Core Components/Risk Factors/Patient Goals at Admission:  Personal Goals and Risk Factors at Admission - 05/11/22 1541       Core Components/Risk Factors/Patient Goals on Admission    Weight Management Yes;Weight Loss    Intervention Weight Management: Develop a combined nutrition and exercise program designed to reach desired caloric intake, while maintaining appropriate intake of nutrient and fiber, sodium and fats, and appropriate energy expenditure required for the weight goal.;Weight Management: Provide education and appropriate resources to help participant work on and attain dietary goals.;Weight Management/Obesity: Establish reasonable short term and long term weight goals.    Admit Weight 132 lb 14.4 oz (60.3 kg)    Goal Weight: Short Term 130 lb (59 kg)    Goal Weight: Long Term 125 lb (56.7 kg)    Expected Outcomes Short Term: Continue to assess and modify interventions until short term weight is achieved;Long Term: Adherence to nutrition and physical activity/exercise program aimed toward attainment of established weight goal;Weight Loss: Understanding of general recommendations for a balanced deficit meal plan, which promotes 1-2 lb weight loss per week and includes a negative energy balance of [phone] kcal/d;Understanding recommendations for meals to include 15-35% energy as protein, 25-35% energy from fat, 35-60% energy from carbohydrates, less than 200mg  of dietary cholesterol, 20-35 gm of total fiber daily;Understanding of  distribution of calorie intake throughout the day with the consumption of 4-5 meals/snacks    Tobacco Cessation Yes    Number of packs per day 1/4    Intervention Assist the participant in steps to quit. Provide individualized education and counseling about committing to Tobacco Cessation, relapse prevention, and pharmacological support that can be provided by physician.;Education officer, environmental, assist with locating and accessing local/national Quit Smoking programs, and support quit date choice.    Expected Outcomes Short Term: Will quit all tobacco product use, adhering to prevention of relapse plan.;Short Term: Will demonstrate readiness to quit, by selecting a quit date.;Long Term: Complete abstinence from all tobacco products for at least 12 months from quit date.    Improve shortness of breath with ADL's Yes    Intervention Provide education, individualized exercise plan and daily activity instruction to help decrease symptoms of SOB with activities of daily living.    Expected Outcomes Short Term: Improve cardiorespiratory fitness to achieve a reduction of symptoms when performing ADLs;Long Term: Be able  to perform more ADLs without symptoms or delay the onset of symptoms    Hypertension Yes    Intervention Provide education on lifestyle modifcations including regular physical activity/exercise, weight management, moderate sodium restriction and increased consumption of fresh fruit, vegetables, and low fat dairy, alcohol moderation, and smoking cessation.;Monitor prescription use compliance.    Expected Outcomes Short Term: Continued assessment and intervention until BP is < 140/27mm HG in hypertensive participants. < 130/21mm HG in hypertensive participants with diabetes, heart failure or chronic kidney disease.;Long Term: Maintenance of blood pressure at goal levels.    Lipids Yes    Intervention Provide education and support for participant on nutrition & aerobic/resistive exercise along  with prescribed medications to achieve LDL 70mg , HDL >40mg .    Expected Outcomes Short Term: Participant states understanding of desired cholesterol values and is compliant with medications prescribed. Participant is following exercise prescription and nutrition guidelines.;Long Term: Cholesterol controlled with medications as prescribed, with individualized exercise RX and with personalized nutrition plan. Value goals: LDL < 70mg , HDL > 40 mg.             Education:Diabetes - Individual verbal and written instruction to review signs/symptoms of diabetes, desired ranges of glucose level fasting, after meals and with exercise. Acknowledge that pre and post exercise glucose checks will be done for 3 sessions at entry of program.   Know Your Numbers and Heart Failure: - Group verbal and visual instruction to discuss disease risk factors for cardiac and pulmonary disease and treatment options.  Reviews associated critical values for Overweight/Obesity, Hypertension, Cholesterol, and Diabetes.  Discusses basics of heart failure: signs/symptoms and treatments.  Introduces Heart Failure Zone chart for action plan for heart failure.  Written material given at graduation.   Core Components/Risk Factors/Patient Goals Review:   Goals and Risk Factor Review     Row Name 06/23/22 1615             Core Components/Risk Factors/Patient Goals Review   Personal Goals Review Improve shortness of breath with ADL's       Review Spoke to patient about their shortness of breath and what they can do to improve. Patient has been informed of breathing techniques when starting the program. Patient is informed to tell staff if they have had any med changes and that certain meds they are taking or not taking can be causing shortness of breath.       Expected Outcomes Short: Attend [facility] regularly to improve shortness of breath with ADL's. Long: maintain independence with ADL's                Core  Components/Risk Factors/Patient Goals at Discharge (Final Review):   Goals and Risk Factor Review - 06/23/22 1615       Core Components/Risk Factors/Patient Goals Review   Personal Goals Review Improve shortness of breath with ADL's    Review Spoke to patient about their shortness of breath and what they can do to improve. Patient has been informed of breathing techniques when starting the program. Patient is informed to tell staff if they have had any med changes and that certain meds they are taking or not taking can be causing shortness of breath.    Expected Outcomes Short: Attend [facility] regularly to improve shortness of breath with ADL's. Long: maintain independence with ADL's             ITP Comments:  ITP Comments     Row Name 05/05/22 1246 05/11/22 1523 05/18/22 1613 06/01/22  1052 06/21/22 1458   ITP Comments Virtual Visit completed. Patient informed on EP and RD appointment and 6 Minute walk test. Patient also informed of patient health questionnaires on My Chart. Patient Verbalizes understanding. Visit diagnosis can be found in [facility] 01/05/2022. Completed and gym orientation. Initial ITP created and sent for review to Dr. Vida Rigger, Medical Director. First full day of exercise!  Patient was oriented to gym and equipment including functions, settings, policies, and procedures.  Patient's individual exercise prescription and treatment plan were reviewed.  All starting workloads were established based on the results of the 6 minute walk test done at initial orientation visit.  The plan for exercise progression was also introduced and progression will be customized based on patient's performance and goals. 30 Day review completed. Medical Director ITP review done, changes made as directed, and signed approval by Medical Director.    new to program We called Nereyda to check in with her as she has not attended rehab since 06/09/2022. She reports not feeling well last week, but she is  feeling better. We reminded Caitlain to call and let us know if she is going to miss any of her scheduled sessions. She stated that she plans to return to pulmonary rehab on 06/23/2022.    Row Name 06/29/22 1018 07/27/22 1431 08/10/22 1206 08/24/22 0918 09/19/22 1046   ITP Comments 30 Day review completed. Medical Director ITP review done, changes made as directed, and signed approval by Medical Director. 30 day review completed. ITP sent to Dr. Jinny Sanders, Medical Director of  Pulmonary Rehab. Continue with ITP unless changes are made by physician.  Pt had fallen at home at end of January.  She was scheduled to return on 07/20/22 but has yet to return to rehab. Spoke with patient again to follow up, she is still recovering from her fall weeks ago and wants to talk to her PCP about resuming our program. She wants to come back but wants to talk to MD first. Appt on 3/14- patient will call us after then to let us know. 30 Day review completed. Medical Director ITP review done, changes made as directed, and signed approval by Medical Director.   remains out No response back from patient. Will discharge at this time.            Comments: Discharge ITP

## 2022-09-19 NOTE — Progress Notes (Signed)
Discharge Progress Report  Patient Details  Name: Terri Burns MRN: [id number] Date of Birth: [birth year] Referring Provider:   Flowsheet Row Pulmonary Rehab from 05/11/2022 in [facility]  Referring Provider Dr. Sumner Boast, MD        Number of Visits: 10  Reason for Discharge:  Early Exit:  Lack of attendance   Diagnosis:  Chronic obstructive pulmonary disease, unspecified COPD type   Functional Capacity:  6 Minute Walk     Row Name 05/11/22 1548         6 Minute Walk   Phase Initial     Distance 655 feet     Walk Time 5.85 minutes     # of Rest Breaks 1     MPH 1.27     METS 1.63     RPE 11     Perceived Dyspnea  1     VO2 Peak 5.72     Symptoms Yes (comment)     Comments Neck/Shoulder pain (5/10)     Resting HR 71 bpm     Resting BP 128/76     Resting Oxygen Saturation  98 %     Exercise Oxygen Saturation  during 6 min walk 89 %     Max Ex. HR 91 bpm     Max Ex. BP 148/88     2 Minute Post BP 136/82       Interval HR   1 Minute HR 80     2 Minute HR 85     3 Minute HR 87     4 Minute HR 89     5 Minute HR 91     6 Minute HR 88     2 Minute Post HR 79     Interval Heart Rate? Yes       Interval Oxygen   Interval Oxygen? Yes     Baseline Oxygen Saturation % 98 %     1 Minute Oxygen Saturation % 92 %     1 Minute Liters of Oxygen 0 L  RA     2 Minute Oxygen Saturation % 92 %     2 Minute Liters of Oxygen 0 L  RA     3 Minute Oxygen Saturation % 92 %     3 Minute Liters of Oxygen 0 L  RA     4 Minute Oxygen Saturation % 90 %     4 Minute Liters of Oxygen 0 L  RA     5 Minute Oxygen Saturation % 90 %     5 Minute Liters of Oxygen 0 L  RA     6 Minute Oxygen Saturation % 89 %     6 Minute Liters of Oxygen 0 L  RA     2 Minute Post Oxygen Saturation % 97 %     2 Minute Post Liters of Oxygen 0 L  RA

## 2022-09-22 ENCOUNTER — Encounter (HOSPITAL_BASED_OUTPATIENT_CLINIC_OR_DEPARTMENT_OTHER): Payer: Self-pay

## 2022-09-22 DIAGNOSIS — G479 Sleep disorder, unspecified: Secondary | ICD-10-CM

## 2022-10-05 ENCOUNTER — Encounter (HOSPITAL_BASED_OUTPATIENT_CLINIC_OR_DEPARTMENT_OTHER): Payer: Medicare HMO | Admitting: Internal Medicine

## 2022-10-12 ENCOUNTER — Ambulatory Visit (HOSPITAL_BASED_OUTPATIENT_CLINIC_OR_DEPARTMENT_OTHER): Payer: Medicare HMO | Attending: Internal Medicine | Admitting: Internal Medicine

## 2023-03-01 ENCOUNTER — Ambulatory Visit: Admission: RE | Admit: 2023-03-01 | Payer: Medicare HMO | Source: Ambulatory Visit

## 2023-03-15 ENCOUNTER — Other Ambulatory Visit: Payer: Self-pay | Admitting: Internal Medicine

## 2023-03-15 ENCOUNTER — Ambulatory Visit
Admission: RE | Admit: 2023-03-15 | Discharge: 2023-03-15 | Disposition: A | Discharge status: Home / Self Care | Payer: Medicare HMO | Source: Ambulatory Visit | Attending: Internal Medicine | Admitting: Internal Medicine

## 2023-03-15 DIAGNOSIS — M5451 Vertebrogenic low back pain: Secondary | ICD-10-CM

## 2023-03-15 DIAGNOSIS — M542 Cervicalgia: Secondary | ICD-10-CM

## 2023-03-15 DIAGNOSIS — Z9189 Other specified personal risk factors, not elsewhere classified: Secondary | ICD-10-CM

## 2023-03-15 DIAGNOSIS — M549 Dorsalgia, unspecified: Secondary | ICD-10-CM

## 2023-03-15 DIAGNOSIS — Z981 Arthrodesis status: Secondary | ICD-10-CM

## 2023-05-14 ENCOUNTER — Encounter: Payer: Self-pay | Admitting: Emergency Medicine

## 2023-05-14 ENCOUNTER — Inpatient Hospital Stay
Admission: EM | Admit: 2023-05-14 | Discharge: 2023-05-20 | DRG: 193 | Disposition: A | Discharge status: Home Health | Payer: Medicare HMO | Attending: Internal Medicine | Admitting: Internal Medicine

## 2023-05-14 ENCOUNTER — Other Ambulatory Visit: Payer: Self-pay

## 2023-05-14 ENCOUNTER — Inpatient Hospital Stay: Payer: Medicare HMO

## 2023-05-14 ENCOUNTER — Emergency Department: Payer: Medicare HMO

## 2023-05-14 DIAGNOSIS — F17210 Nicotine dependence, cigarettes, uncomplicated: Secondary | ICD-10-CM | POA: Diagnosis present

## 2023-05-14 DIAGNOSIS — J440 Chronic obstructive pulmonary disease with acute lower respiratory infection: Principal | ICD-10-CM | POA: Diagnosis present

## 2023-05-14 DIAGNOSIS — Z79899 Other long term (current) drug therapy: Secondary | ICD-10-CM

## 2023-05-14 DIAGNOSIS — J441 Chronic obstructive pulmonary disease with (acute) exacerbation: Principal | ICD-10-CM | POA: Diagnosis present

## 2023-05-14 DIAGNOSIS — Z1152 Encounter for screening for COVID-19: Secondary | ICD-10-CM

## 2023-05-14 DIAGNOSIS — Z888 Allergy status to other drugs, medicaments and biological substances status: Secondary | ICD-10-CM

## 2023-05-14 DIAGNOSIS — Z886 Allergy status to analgesic agent status: Secondary | ICD-10-CM

## 2023-05-14 DIAGNOSIS — R0602 Shortness of breath: Secondary | ICD-10-CM | POA: Diagnosis not present

## 2023-05-14 DIAGNOSIS — Z7982 Long term (current) use of aspirin: Secondary | ICD-10-CM

## 2023-05-14 DIAGNOSIS — J471 Bronchiectasis with (acute) exacerbation: Secondary | ICD-10-CM | POA: Diagnosis present

## 2023-05-14 DIAGNOSIS — I10 Essential (primary) hypertension: Secondary | ICD-10-CM | POA: Diagnosis present

## 2023-05-14 DIAGNOSIS — E785 Hyperlipidemia, unspecified: Secondary | ICD-10-CM | POA: Diagnosis present

## 2023-05-14 DIAGNOSIS — Z7951 Long term (current) use of inhaled steroids: Secondary | ICD-10-CM

## 2023-05-14 DIAGNOSIS — D509 Iron deficiency anemia, unspecified: Secondary | ICD-10-CM | POA: Diagnosis present

## 2023-05-14 DIAGNOSIS — Z881 Allergy status to other antibiotic agents status: Secondary | ICD-10-CM

## 2023-05-14 DIAGNOSIS — J13 Pneumonia due to Streptococcus pneumoniae: Secondary | ICD-10-CM | POA: Diagnosis not present

## 2023-05-14 DIAGNOSIS — J432 Centrilobular emphysema: Secondary | ICD-10-CM

## 2023-05-14 DIAGNOSIS — D649 Anemia, unspecified: Secondary | ICD-10-CM | POA: Diagnosis present

## 2023-05-14 DIAGNOSIS — Z720 Tobacco use: Secondary | ICD-10-CM | POA: Diagnosis present

## 2023-05-14 DIAGNOSIS — R7989 Other specified abnormal findings of blood chemistry: Secondary | ICD-10-CM | POA: Diagnosis present

## 2023-05-14 DIAGNOSIS — Z833 Family history of diabetes mellitus: Secondary | ICD-10-CM

## 2023-05-14 DIAGNOSIS — J189 Pneumonia, unspecified organism: Secondary | ICD-10-CM

## 2023-05-14 DIAGNOSIS — J9601 Acute respiratory failure with hypoxia: Secondary | ICD-10-CM | POA: Diagnosis present

## 2023-05-14 DIAGNOSIS — Z955 Presence of coronary angioplasty implant and graft: Secondary | ICD-10-CM

## 2023-05-14 DIAGNOSIS — Z85828 Personal history of other malignant neoplasm of skin: Secondary | ICD-10-CM

## 2023-05-14 DIAGNOSIS — Z8249 Family history of ischemic heart disease and other diseases of the circulatory system: Secondary | ICD-10-CM

## 2023-05-14 DIAGNOSIS — J439 Emphysema, unspecified: Secondary | ICD-10-CM | POA: Diagnosis present

## 2023-05-14 DIAGNOSIS — Z7902 Long term (current) use of antithrombotics/antiplatelets: Secondary | ICD-10-CM

## 2023-05-14 DIAGNOSIS — J45902 Unspecified asthma with status asthmaticus: Secondary | ICD-10-CM | POA: Diagnosis present

## 2023-05-14 DIAGNOSIS — Z823 Family history of stroke: Secondary | ICD-10-CM

## 2023-05-14 DIAGNOSIS — Z880 Allergy status to penicillin: Secondary | ICD-10-CM

## 2023-05-14 DIAGNOSIS — K219 Gastro-esophageal reflux disease without esophagitis: Secondary | ICD-10-CM | POA: Diagnosis present

## 2023-05-14 DIAGNOSIS — J4489 Other specified chronic obstructive pulmonary disease: Secondary | ICD-10-CM | POA: Diagnosis present

## 2023-05-14 DIAGNOSIS — F32A Depression, unspecified: Secondary | ICD-10-CM | POA: Diagnosis present

## 2023-05-14 DIAGNOSIS — Z951 Presence of aortocoronary bypass graft: Secondary | ICD-10-CM

## 2023-05-14 DIAGNOSIS — I2510 Atherosclerotic heart disease of native coronary artery without angina pectoris: Secondary | ICD-10-CM | POA: Diagnosis present

## 2023-05-14 LAB — CBC WITH DIFFERENTIAL/PLATELET
Abs Immature Granulocytes: 0.06 10*3/uL (ref 0.00–0.07)
Basophils Absolute: 0.1 10*3/uL (ref 0.0–0.1)
Basophils Relative: 1 %
Eosinophils Absolute: 0.2 10*3/uL (ref 0.0–0.5)
Eosinophils Relative: 2 %
HCT: 27.2 % — ABNORMAL LOW (ref 36.0–46.0)
Hemoglobin: 8.6 g/dL — ABNORMAL LOW (ref 12.0–15.0)
Immature Granulocytes: 1 %
Lymphocytes Relative: 23 %
Lymphs Abs: 2.4 10*3/uL (ref 0.7–4.0)
MCH: 25.2 pg — ABNORMAL LOW (ref 26.0–34.0)
MCHC: 31.6 g/dL (ref 30.0–36.0)
MCV: 79.8 fL — ABNORMAL LOW (ref 80.0–100.0)
Monocytes Absolute: 1 10*3/uL (ref 0.1–1.0)
Monocytes Relative: 9 %
Neutro Abs: 6.9 10*3/uL (ref 1.7–7.7)
Neutrophils Relative %: 64 %
Platelets: 318 10*3/uL (ref 150–400)
RBC: 3.41 MIL/uL — ABNORMAL LOW (ref 3.87–5.11)
RDW: 17.7 % — ABNORMAL HIGH (ref 11.5–15.5)
WBC: 10.5 10*3/uL (ref 4.0–10.5)
nRBC: 0 % (ref 0.0–0.2)

## 2023-05-14 LAB — BRAIN NATRIURETIC PEPTIDE: B Natriuretic Peptide: 156.4 pg/mL — ABNORMAL HIGH (ref 0.0–100.0)

## 2023-05-14 LAB — BASIC METABOLIC PANEL
Anion gap: 10 (ref 5–15)
BUN: 11 mg/dL (ref 8–23)
CO2: 19 mmol/L — ABNORMAL LOW (ref 22–32)
Calcium: 8.2 mg/dL — ABNORMAL LOW (ref 8.9–10.3)
Chloride: 110 mmol/L (ref 98–111)
Creatinine, Ser: 0.71 mg/dL (ref 0.44–1.00)
GFR, Estimated: >60 mL/min (ref >60)
Glucose, Bld: 96 mg/dL (ref 70–99)
Potassium: 4 mmol/L (ref 3.5–5.1)
Sodium: 139 mmol/L (ref 135–145)

## 2023-05-14 LAB — HEPATIC FUNCTION PANEL
ALT: 29 U/L (ref 0–44)
AST: 33 U/L (ref 15–41)
Albumin: 3.3 g/dL — ABNORMAL LOW (ref 3.5–5.0)
Alkaline Phosphatase: 68 U/L (ref 38–126)
Bilirubin, Direct: 0.2 mg/dL (ref 0.0–0.2)
Indirect Bilirubin: 0.8 mg/dL (ref 0.3–0.9)
Total Bilirubin: 1 mg/dL (ref <1.2)
Total Protein: 7.1 g/dL (ref 6.5–8.1)

## 2023-05-14 LAB — PROCALCITONIN: Procalcitonin: <0.1 ng/mL

## 2023-05-14 LAB — SARS CORONAVIRUS 2 BY RT PCR: SARS Coronavirus 2 by RT PCR: NEGATIVE

## 2023-05-14 LAB — D-DIMER, QUANTITATIVE: D-Dimer, Quant: 0.52 ug{FEU}/mL — ABNORMAL HIGH (ref 0.00–0.50)

## 2023-05-14 LAB — LACTIC ACID, PLASMA: Lactic Acid, Venous: 0.8 mmol/L (ref 0.5–1.9)

## 2023-05-14 MED ORDER — TICAGRELOR 90 MG PO TABS
90.0000 mg | ORAL_TABLET | Freq: Two times a day (BID) | ORAL | Status: DC
  Administered 2023-05-15 – 2023-05-20 (×11): 90 mg via ORAL
  Filled 2023-05-14 (×11): qty 1

## 2023-05-14 MED ORDER — ACETAMINOPHEN 650 MG RE SUPP: 650.0000 mg | Freq: Four times a day (QID) | RECTAL | Status: DC | PRN

## 2023-05-14 MED ORDER — ALBUTEROL SULFATE (2.5 MG/3ML) 0.083% IN NEBU
2.5000 mg | INHALATION_SOLUTION | RESPIRATORY_TRACT | Status: DC | PRN
  Administered 2023-05-15: 2.5 mg via RESPIRATORY_TRACT
  Filled 2023-05-14: qty 3

## 2023-05-14 MED ORDER — SODIUM CHLORIDE 0.9 % IV SOLN
500.0000 mg | INTRAVENOUS | Status: AC
  Administered 2023-05-14 – 2023-05-18 (×5): 500 mg via INTRAVENOUS
  Filled 2023-05-14 (×5): qty 5

## 2023-05-14 MED ORDER — HYDRALAZINE HCL 20 MG/ML IJ SOLN: 5.0000 mg | INTRAMUSCULAR | Status: DC | PRN

## 2023-05-14 MED ORDER — IOHEXOL 350 MG/ML SOLN
75.0000 mL | Freq: Once | INTRAVENOUS | Status: AC | PRN
  Administered 2023-05-15: 75 mL via INTRAVENOUS

## 2023-05-14 MED ORDER — ATORVASTATIN CALCIUM 80 MG PO TABS
80.0000 mg | ORAL_TABLET | Freq: Every day | ORAL | Status: DC
Start: 2023-05-15 — End: 2023-05-20
  Administered 2023-05-15 – 2023-05-20 (×6): 80 mg via ORAL
  Filled 2023-05-14 (×2): qty 1
  Filled 2023-05-14: qty 4
  Filled 2023-05-14 (×3): qty 1

## 2023-05-14 MED ORDER — ACETAMINOPHEN 325 MG PO TABS
650.0000 mg | ORAL_TABLET | Freq: Four times a day (QID) | ORAL | Status: DC | PRN
  Administered 2023-05-15: 650 mg via ORAL
  Filled 2023-05-14: qty 2

## 2023-05-14 MED ORDER — ALBUTEROL SULFATE HFA 108 (90 BASE) MCG/ACT IN AERS: 2.0000 | INHALATION_SPRAY | Freq: Four times a day (QID) | RESPIRATORY_TRACT | Status: DC | PRN

## 2023-05-14 MED ORDER — HYDROCODONE-ACETAMINOPHEN 5-325 MG PO TABS
1.0000 | ORAL_TABLET | ORAL | Status: DC | PRN
  Administered 2023-05-15 – 2023-05-16 (×4): 1 via ORAL
  Administered 2023-05-16: 2 via ORAL
  Administered 2023-05-16 (×2): 1 via ORAL
  Administered 2023-05-17 (×2): 2 via ORAL
  Administered 2023-05-17: 1 via ORAL
  Administered 2023-05-17: 2 via ORAL
  Administered 2023-05-18: 1 via ORAL
  Administered 2023-05-18 – 2023-05-20 (×12): 2 via ORAL
  Filled 2023-05-14 (×2): qty 1
  Filled 2023-05-14: qty 2
  Filled 2023-05-14: qty 1
  Filled 2023-05-14 (×2): qty 2
  Filled 2023-05-14: qty 1
  Filled 2023-05-14 (×2): qty 2
  Filled 2023-05-14: qty 1
  Filled 2023-05-14: qty 2
  Filled 2023-05-14: qty 1
  Filled 2023-05-14 (×4): qty 2
  Filled 2023-05-14 (×2): qty 1
  Filled 2023-05-14: qty 2
  Filled 2023-05-14: qty 1
  Filled 2023-05-14: qty 2
  Filled 2023-05-14: qty 1
  Filled 2023-05-14 (×2): qty 2
  Filled 2023-05-14: qty 1
  Filled 2023-05-14 (×2): qty 2

## 2023-05-14 MED ORDER — GUAIFENESIN ER 600 MG PO TB12
600.0000 mg | ORAL_TABLET | Freq: Two times a day (BID) | ORAL | Status: DC | PRN
  Administered 2023-05-16 – 2023-05-17 (×3): 600 mg via ORAL
  Filled 2023-05-14 (×3): qty 1

## 2023-05-14 MED ORDER — CITALOPRAM HYDROBROMIDE 20 MG PO TABS
10.0000 mg | ORAL_TABLET | Freq: Every day | ORAL | Status: DC
  Administered 2023-05-15 – 2023-05-20 (×6): 10 mg via ORAL
  Filled 2023-05-14 (×6): qty 1

## 2023-05-14 MED ORDER — MAGNESIUM SULFATE 2 GM/50ML IV SOLN
2.0000 g | INTRAVENOUS | Status: AC
  Administered 2023-05-14: 2 g via INTRAVENOUS
  Filled 2023-05-14: qty 50

## 2023-05-14 MED ORDER — SODIUM CHLORIDE 0.9 % IV SOLN
2.0000 g | INTRAVENOUS | Status: DC
  Administered 2023-05-14 – 2023-05-17 (×4): 2 g via INTRAVENOUS
  Filled 2023-05-14 (×5): qty 20

## 2023-05-14 MED ORDER — MORPHINE SULFATE (PF) 2 MG/ML IV SOLN: 2.0000 mg | INTRAVENOUS | Status: DC | PRN

## 2023-05-14 MED ORDER — ALBUTEROL SULFATE (2.5 MG/3ML) 0.083% IN NEBU
5.0000 mg | INHALATION_SOLUTION | Freq: Once | RESPIRATORY_TRACT | Status: AC
  Administered 2023-05-14: 5 mg via RESPIRATORY_TRACT
  Filled 2023-05-14: qty 6

## 2023-05-14 MED ORDER — ONDANSETRON HCL 4 MG/2ML IJ SOLN
4.0000 mg | Freq: Four times a day (QID) | INTRAMUSCULAR | Status: DC | PRN
Start: 2023-05-14 — End: 2023-05-20

## 2023-05-14 MED ORDER — ASPIRIN 81 MG PO TBEC
81.0000 mg | DELAYED_RELEASE_TABLET | Freq: Every day | ORAL | Status: DC
  Administered 2023-05-15 – 2023-05-20 (×6): 81 mg via ORAL
  Filled 2023-05-14 (×6): qty 1

## 2023-05-14 MED ORDER — ONDANSETRON HCL 4 MG PO TABS: 4.0000 mg | ORAL_TABLET | Freq: Four times a day (QID) | ORAL | Status: DC | PRN

## 2023-05-14 NOTE — Hospital Course (Addendum)
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    Upon evaluation in the emergency department patient felt to be suffering from COPD and asthma exacerbation.  The hospitalist group was then called to assess the patient for admission to the hospital.  CT angiogram of the chest revealed no evidence of pulmonary embolism but did reveal a possible new right upper lobe pneumonia with evidence of bronchiectasis.  Antibiotics were adjusted from ceftriaxone to cefepime to include pseudomonal coverage.   Patient was additionally treated with aggressive bronchodilator therapy and systemic steroids.  Strep pneumoniae antigen was found to be positive and felt to be the culprit for the patient's pneumonia and likely what exacerbated her COPD.  Due to patient's advanced COPD, recovery was slow over the next few days.  Patient was eventually weaned off of supplemental oxygen with improving shortness of breath and wheezing.  Patient was eventually able to be weaned to room air and was able to ambulate and exhibit physical activity near her baseline without any new documented need for supplemental oxygen.  Over the course of the hospitalization, patient was found to be notably anemic.  This was found to be multifactorial secondary to low vitamin B12 levels as well as iron deficiency.  Patient was provided both intravenous iron infusion and intramuscular vitamin B12.  At time of discharge patient has been placed on both vitamin B12 and iron supplementation.  Patient has been advised to follow-up with her primary care providers surrounding the potential need for further workup of her iron deficiency although her advanced COPD may make this difficult.  That being said, patient still reports smoking and was counseled extensively on  smoking cessation throughout the hospitalization.  Patient was informed that her COPD is rather advanced and that upon discharge she must follow closely with her pulmonologist and cease smoking.  Patient was discharged home in improved and stable condition on 05/20/2023.  Home going home health physical therapy services were arranged at time of discharge.

## 2023-05-14 NOTE — H&P (Incomplete)
History and Physical    Patient: Terri Burns BJY:[id number] DOB: 01-20-1951 DOA: 05/14/2023 DOS: the patient was seen and examined on 05/14/2023 PCP: Terri Daisy Danton Clap, MD  Patient coming from: Home  Chief Complaint:  Chief Complaint  Patient presents with  . Shortness of Breath         HPI: Terri Burns is a 72 y.o. female with medical history significant for COPD, asthma, depression, hypertension, dyslipidemia, CAD status post PCI and stent, and GERD coming for SOB. Shortness of started last night at 3 AM and has progressively gotten worse with no relief of baseline neb treatments and inhalers at home.  Patient did receive Solu-Medrol via EMS along with a DuoNeb treatment and on arrival was noted to have respiratory rate of 22 patient does not meet SIRS criteria.  In emergency room vitals trend shows: Vitals:   05/14/23 1858 05/14/23 1900 05/14/23 1930 05/14/23 2030  BP:  102/63 102/73 106/65  Pulse:  80 81 79  Temp: 98.1 F (36.7 C)     Resp:  20 18 16   Height:      Weight:      SpO2:  99% 100% 100%  TempSrc: Oral     BMI (Calculated):      >>EKG shows:Sinus rhythm at 83 PR 150 significant Q waves in leads II, III and aVF, QTc of 469. >>2 D Echo 10/2021: 1. Left ventricular ejection fraction, by estimation, is 60 to 65% . Left ventricular ejection fraction by 2D MOD biplane is 67. 0 % . The left ventricle has normal function. The left ventricle has no regional wall motion abnormalities. Left ventricular diastolic parameters are consistent with Grade I diastolic dysfunction ( impaired relaxation) . The average left ventricular global longitudinal strain is - 17. 5 % . 2. Right ventricular systolic function is normal. The right ventricular size is normal. 3. The mitral valve is normal in structure. Mild mitral valve regurgitation. 4. The aortic valve was not well visualized. Aortic valve regurgitation is trivial. 5. The inferior vena cava is dilated in size with > 50%  respiratory variability, suggesting right atrial pressure of 8 mmHg.  >>IMPRESSION: Patchy airspace opacity over the right upper lobe concerning for pneumonia. Followup PA and lateral chest X-ray is recommended in 3-4 weeks following trial of antibiotic therapy to ensure resolution and exclude underlying malignancy.    >>Allergies to levaquin/ mobic / penicillin / nsaids Chest x-ray positive for right upper lobe infiltrate. Labs are notable for : - BMP shows co2 19. - CBC shows anemia with hemoglobin of 8.6 and RDW 17.7 - Viral panel pending.  In the ED pt received: Medications  cefTRIAXone (ROCEPHIN) 2 g in sodium chloride 0.9 % 100 mL IVPB (0 g Intravenous Stopped 05/14/23 2107)  azithromycin (ZITHROMAX) 500 mg in sodium chloride 0.9 % 250 mL IVPB (500 mg Intravenous New Bag/Given 05/14/23 2114)  citalopram (CELEXA) tablet 10 mg (has no administration in time range)  aspirin EC tablet 81 mg (has no administration in time range)  atorvastatin (LIPITOR) tablet 80 mg (has no administration in time range)  ticagrelor (BRILINTA) tablet 90 mg (has no administration in time range)  acetaminophen (TYLENOL) tablet 650 mg (has no administration in time range)    Or  acetaminophen (TYLENOL) suppository 650 mg (has no administration in time range)  HYDROcodone-acetaminophen (NORCO/VICODIN) 5-325 MG per tablet 1-2 tablet (has no administration in time range)  morphine (PF) 2 MG/ML injection 2 mg (has no administration in time range)  ondansetron (ZOFRAN) tablet 4 mg (has no administration in time range)    Or  ondansetron (ZOFRAN) injection 4 mg (has no administration in time range)  albuterol (PROVENTIL) (2.5 MG/3ML) 0.083% nebulizer solution 2.5 mg (has no administration in time range)  guaiFENesin (MUCINEX) 12 hr tablet 600 mg (has no administration in time range)  hydrALAZINE (APRESOLINE) injection 5 mg (has no administration in time range)  albuterol (PROVENTIL) (2.5 MG/3ML) 0.083% nebulizer  solution 5 mg (5 mg Nebulization Given 05/14/23 1906)  magnesium sulfate IVPB 2 g 50 mL (0 g Intravenous Stopped 05/14/23 2007)   Review of Systems  Respiratory:  Positive for cough, shortness of breath and wheezing.    Past Medical History:  Diagnosis Date  . COPD (chronic obstructive pulmonary disease) (HCC)   . Depression   . GERD (gastroesophageal reflux disease)   . 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 Hyperlipidemia   . Hypertension   . 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 CORONARY STENT INTERVENTION N/A 02/22/2021   Procedure: CORONARY STENT INTERVENTION;  Surgeon: Iran Ouch, MD;  Location: [facility] INVASIVE CV LAB;  Service: Cardiovascular;  Laterality: N/A;  . LEFT HEART CATH AND CORONARY ANGIOGRAPHY N/A 02/22/2021   Procedure: LEFT HEART CATH AND CORONARY ANGIOGRAPHY;  Surgeon: Iran Ouch, MD;  Location: [facility] INVASIVE CV LAB;  Service: Cardiovascular;  Laterality: N/A;  . NECK SURGERY N/A 10/2003  . SHOULDER SURGERY Left 2004    reports that she has been smoking cigarettes. She started smoking about 49 years ago. She has a 12.5 pack-year smoking history. She has never used smokeless tobacco. She reports current alcohol use. She reports that she does not use drugs.  Allergies  Allergen Reactions  . Levofloxacin Shortness Of Breath  . Penicillins Hives    itching  . Meloxicam     dizziness  . Nsaids Other (See Comments)    Patient states she can tolerate up to three doses per day without incident    Family History  Problem Relation Age of Onset  . Hypertension Mother   . Stroke Mother   . Heart disease Father   . Diabetes Father   . Heart disease Daughter   . Pulmonary embolism Daughter   . Hypertension Son   . Breast cancer Neg Hx     Prior to Admission medications   Medication Sig Start Date End Date Taking?  Authorizing Provider  ACCU-CHEK GUIDE test strip USE AS DIRECTED TO Check blood glucose 03/25/22   [provider]  Accu-Chek Softclix Lancets lancets as directed. Patient not taking: Reported on 05/05/2022 03/25/22   [provider]  albuterol (PROVENTIL) (2.5 MG/3ML) 0.083% nebulizer solution Take 3 mLs (2.5 mg total) by nebulization 4 (four) times daily. 01/07/22   Calvert Cantor, MD  ALPRAZolam Prudy Feeler) 0.25 MG tablet Take 0.25 mg by mouth 2 (two) times daily as needed. 11/18/21   [provider]  amLODipine (NORVASC) 10 MG tablet Take 1 tablet (10 mg total) by mouth daily. 01/07/22 01/07/23  Calvert Cantor, MD  aspirin 81 MG EC tablet TAKE 1 TABLET BY MOUTH EVERY DAY 04/22/19   Alba Cory, MD  atorvastatin (LIPITOR) 20 MG tablet Take 20 mg by mouth every morning. Patient not taking: Reported on 05/05/2022 11/18/21   [provider]  atorvastatin (LIPITOR) 40 MG tablet Take 2 tablets (  80 mg total) by mouth daily. 09/07/21   Alba Cory, MD  atorvastatin (LIPITOR) 80 MG tablet  11/18/21   [provider]  azithromycin (ZITHROMAX) 250 MG tablet Take 250 mg by mouth daily. 03/25/22   [provider]  Blood Glucose Monitoring Suppl (ACCU-CHEK GUIDE) w/Device KIT USE AS DIRECTED TO Check blood glucose 03/25/22   [provider]  BRILINTA 90 MG TABS tablet TAKE ONE TABLET BY MOUTH EVERY MORNING and TAKE ONE TABLET BY MOUTH EVERYDAY AT BEDTIME 02/15/22   Iran Ouch, MD  citalopram (CELEXA) 20 MG tablet TAKE 1 AND 1/2 TABLETS BY MOUTH EVERY EVENING 12/08/21   Carlynn Purl, Danna Hefty, MD  fluticasone (FLONASE) 50 MCG/ACT nasal spray PLACE TWO SPRAYS into BOTH nostrils DAILY 12/03/21   Alba Cory, MD  Fluticasone-Umeclidin-Vilant (TRELEGY ELLIPTA) 100-62.5-25 MCG/ACT AEPB Inhale 1 puff into the lungs daily. 10/05/21   Alba Cory, MD  ipratropium-albuterol (DUONEB) 0.5-2.5 (3) MG/3ML SOLN Take by nebulization. 03/11/22   [provider]   isosorbide mononitrate (IMDUR) 30 MG 24 hr tablet TAKE 1/2 TABLET BY MOUTH ONCE DAILY 12/06/21   Alba Cory, MD  loratadine (ALLERGY RELIEF) 10 MG tablet Take 1 tablet (10 mg total) by mouth daily. 09/07/21   Alba Cory, MD  losartan (COZAAR) 100 MG tablet Take 1 tablet (100 mg total) by mouth every evening. 10/05/21   Alba Cory, MD  metoprolol succinate (TOPROL-XL) 25 MG 24 hr tablet Take 1 tablet (25 mg total) by mouth every evening. 10/05/21   Alba Cory, MD  NARCAN 4 MG/0.1ML LIQD nasal spray kit 1 spray as needed. As needed in case of overdose Patient not taking: Reported on 05/05/2022 04/13/21   [provider]  pantoprazole (PROTONIX) 40 MG tablet Take 1 tablet (40 mg total) by mouth every evening. 10/05/21   Alba Cory, MD  predniSONE (DELTASONE) 10 MG tablet 60 mg tomorrow, taper by 10 mg daily until complete Patient not taking: Reported on 05/05/2022 01/07/22   Calvert Cantor, MD  predniSONE (DELTASONE) 20 MG tablet Take 20 mg by mouth 2 (two) times daily. Patient not taking: Reported on 05/05/2022 03/11/22   [provider]  traMADol (ULTRAM) 50 MG tablet Take 1 tablet (50 mg total) by mouth 3 (three) times daily. 10/05/21   Alba Cory, MD  VENTOLIN HFA 108 (90 Base) MCG/ACT inhaler INHALE 1-2 PUFFS BY MOUTH into THE lungs EVERY 6 HOURS AS NEEDED FOR FOR WHEEZING AND/OR SHORTNESS OF BREATH 10/05/21   Alba Cory, MD   Vitals:   05/14/23 1858 05/14/23 1900 05/14/23 1930 05/14/23 2030  BP:  102/63 102/73 106/65  Pulse:  80 81 79  Resp:  20 18 16   Temp: 98.1 F (36.7 C)     TempSrc: Oral     SpO2:  99% 100% 100%  Weight:      Height:       Physical Exam Vitals and nursing note reviewed.  Constitutional:      General: She is not in acute distress.    Interventions: Nasal cannula in place.  HENT:     Head: Normocephalic and atraumatic.     Right Ear: Hearing normal.     Left Ear: Hearing normal.     Nose: Nose normal. No nasal deformity.      Mouth/Throat:     Lips: Pink.     Tongue: No lesions.     Pharynx: Oropharynx is clear.  Eyes:     General: Lids are normal.  Extraocular Movements: Extraocular movements intact.  Cardiovascular:     Rate and Rhythm: Normal rate and regular rhythm.     Heart sounds: Normal heart sounds.  Pulmonary:     Effort: Pulmonary effort is normal.     Breath sounds: Examination of the right-middle field reveals wheezing. Examination of the left-middle field reveals wheezing. Examination of the right-lower field reveals wheezing. Examination of the left-lower field reveals wheezing. Wheezing present.  Abdominal:     General: Bowel sounds are normal. There is no distension.     Palpations: Abdomen is soft. There is no mass.     Tenderness: There is no abdominal tenderness.  Musculoskeletal:     Right lower leg: No edema.     Left lower leg: No edema.  Skin:    General: Skin is warm.  Neurological:     General: No focal deficit present.     Mental Status: She is alert and oriented to person, place, and time.     Cranial Nerves: Cranial nerves 2-12 are intact.  Psychiatric:        Attention and Perception: Attention normal.        Mood and Affect: Mood normal.        Speech: Speech normal.        Behavior: Behavior normal. Behavior is cooperative.    Labs on Admission: I have personally reviewed following labs and imaging studies Results for orders placed or performed during the hospital encounter of 05/14/23 (from the past 24 hour(s))  Basic metabolic panel     Status: Abnormal   Collection Time: 05/14/23  7:03 PM  Result Value Ref Range   Sodium 139 135 - 145 mmol/L   Potassium 4.0 3.5 - 5.1 mmol/L   Chloride 110 98 - 111 mmol/L   CO2 19 (L) 22 - 32 mmol/L   Glucose, Bld 96 70 - 99 mg/dL   BUN 11 8 - 23 mg/dL   Creatinine, Ser 5.62 0.44 - 1.00 mg/dL   Calcium 8.2 (L) 8.9 - 10.3 mg/dL   GFR, Estimated >13 >08 mL/min   Anion gap 10 5 - 15  CBC with Differential     Status:  Abnormal   Collection Time: 05/14/23  7:03 PM  Result Value Ref Range   WBC 10.5 4.0 - 10.5 K/uL   RBC 3.41 (L) 3.87 - 5.11 MIL/uL   Hemoglobin 8.6 (L) 12.0 - 15.0 g/dL   HCT 65.7 (L) 84.6 - 96.2 %   MCV 79.8 (L) 80.0 - 100.0 fL   MCH 25.2 (L) 26.0 - 34.0 pg   MCHC 31.6 30.0 - 36.0 g/dL   RDW 95.2 (H) 84.1 - 32.4 %   Platelets 318 150 - 400 K/uL   nRBC 0.0 0.0 - 0.2 %   Neutrophils Relative % 64 %   Neutro Abs 6.9 1.7 - 7.7 K/uL   Lymphocytes Relative 23 %   Lymphs Abs 2.4 0.7 - 4.0 K/uL   Monocytes Relative 9 %   Monocytes Absolute 1.0 0.1 - 1.0 K/uL   Eosinophils Relative 2 %   Eosinophils Absolute 0.2 0.0 - 0.5 K/uL   Basophils Relative 1 %   Basophils Absolute 0.1 0.0 - 0.1 K/uL   Immature Granulocytes 1 %   Abs Immature Granulocytes 0.06 0.00 - 0.07 K/uL  Hepatic function panel     Status: Abnormal   Collection Time: 05/14/23  7:03 PM  Result Value Ref Range   Total Protein 7.1 6.5 - 8.1 g/dL  Albumin 3.3 (L) 3.5 - 5.0 g/dL   AST 33 15 - 41 U/L   ALT 29 0 - 44 U/L   Alkaline Phosphatase 68 38 - 126 U/L   Total Bilirubin 1.0 <1.2 mg/dL   Bilirubin, Direct 0.2 0.0 - 0.2 mg/dL   Indirect Bilirubin 0.8 0.3 - 0.9 mg/dL  Brain natriuretic peptide     Status: Abnormal   Collection Time: 05/14/23  7:03 PM  Result Value Ref Range   B Natriuretic Peptide 156.4 (H) 0.0 - 100.0 pg/mL  SARS Coronavirus 2 by RT PCR (hospital order, performed in [facility] hospital lab) *cepheid single result test* Anterior Nasal Swab     Status: None   Collection Time: 05/14/23  7:50 PM   Specimen: Anterior Nasal Swab  Result Value Ref Range   SARS Coronavirus 2 by RT PCR NEGATIVE NEGATIVE  CBC: Recent Labs  Lab 05/14/23 1903  WBC 10.5  NEUTROABS 6.9  HGB 8.6*  HCT 27.2*  MCV 79.8*  PLT 318   Basic Metabolic Panel: Recent Labs  Lab 05/14/23 1903  NA 139  K 4.0  CL 110  CO2 19*  GLUCOSE 96  BUN 11  CREATININE 0.71  CALCIUM 8.2*   Unresulted Labs (From admission, onward)      Start     Ordered   05/15/23 0500  Comprehensive metabolic panel  Tomorrow morning,   R        05/14/23 2151   05/15/23 0500  CBC  Tomorrow morning,   R        05/14/23 2151   05/14/23 2138  Strep pneumoniae urinary antigen  (COPD / Pneumonia / Cellulitis / Lower Extremity Wound)  Once,   R        05/14/23 2151   05/14/23 2101  Lactic acid, plasma  (Lactic Acid)  STAT Now then every 3 hours,   STAT      05/14/23 2100   05/14/23 2059  Procalcitonin  ONCE - URGENT,   URGENT       References:    Procalcitonin Lower Respiratory Tract Infection AND Sepsis Procalcitonin Algorithm   05/14/23 2058   05/14/23 2059  D-dimer, quantitative  ONCE - STAT,   STAT        05/14/23 2058           Medications  cefTRIAXone (ROCEPHIN) 2 g in sodium chloride 0.9 % 100 mL IVPB (0 g Intravenous Stopped 05/14/23 2107)  azithromycin (ZITHROMAX) 500 mg in sodium chloride 0.9 % 250 mL IVPB (500 mg Intravenous New Bag/Given 05/14/23 2114)  citalopram (CELEXA) tablet 10 mg (has no administration in time range)  aspirin EC tablet 81 mg (has no administration in time range)  atorvastatin (LIPITOR) tablet 80 mg (has no administration in time range)  ticagrelor (BRILINTA) tablet 90 mg (has no administration in time range)  acetaminophen (TYLENOL) tablet 650 mg (has no administration in time range)    Or  acetaminophen (TYLENOL) suppository 650 mg (has no administration in time range)  HYDROcodone-acetaminophen (NORCO/VICODIN) 5-325 MG per tablet 1-2 tablet (has no administration in time range)  morphine (PF) 2 MG/ML injection 2 mg (has no administration in time range)  ondansetron (ZOFRAN) tablet 4 mg (has no administration in time range)    Or  ondansetron (ZOFRAN) injection 4 mg (has no administration in time range)  albuterol (PROVENTIL) (2.5 MG/3ML) 0.083% nebulizer solution 2.5 mg (has no administration in time range)  guaiFENesin (MUCINEX) 12 hr tablet 600 mg (has no  administration in time range)   hydrALAZINE (APRESOLINE) injection 5 mg (has no administration in time range)  albuterol (PROVENTIL) (2.5 MG/3ML) 0.083% nebulizer solution 5 mg (5 mg Nebulization Given 05/14/23 1906)  magnesium sulfate IVPB 2 g 50 mL (0 g Intravenous Stopped 05/14/23 2007)   Radiological Exams on Admission: DG Chest Portable 1 View  Result Date: 05/14/2023 CLINICAL DATA:  Shortness of breath, cough EXAM: PORTABLE CHEST 1 VIEW COMPARISON:  01/05/2022 FINDINGS: Heart and mediastinal contours within normal limits. Aortic atherosclerosis. Patchy opacity projects over the right upper lobe. Nodular densities project over the left lower lung compatible with bronchiectasis and scarring seen on prior CT. No effusions. No acute bony abnormality. IMPRESSION: Patchy airspace opacity over the right upper lobe concerning for pneumonia. Followup PA and lateral chest X-ray is recommended in 3-4 weeks following trial of antibiotic therapy to ensure resolution and exclude underlying malignancy. Electronically Signed   By: Charlett Nose M.D.   On: 05/14/2023 19:37     Data Reviewed: Relevant notes from primary care and specialist visits, past discharge summaries as available in EHR, including Care Everywhere. Prior diagnostic testing as pertinent to current admission diagnoses Updated medications and problem lists for reconciliation ED course, including vitals, labs, imaging, treatment and response to treatment Triage notes, nursing and pharmacy notes and ED provider's notes Notable results as noted in HPI  Assessment & Plan SOB (shortness of breath) Secondary to pneumonia as noted on chest x-ray, treating the underlying cause.  Supportive care with supplemental oxygen for comfort or hypoxia as needed.  As needed inhalers and DuoNeb therapy as needed.  Ambulating pulse ox prior to discharge to determine any worsening of existing underlying emphysema.  Will also obtain a D-dimer troponin BNP and further evaluate. Tobacco  abuse Nicotine patch and counseling once patient is stable. GERD (gastroesophageal reflux disease) IV PPI therapy is for aspiration precaution.  Swallow evaluation to further identify if this is more aspiration or reflux related pneumonia or community-acquired pneumonia. Benign essential HTN Vitals:   05/14/23 1852 05/14/23 1900 05/14/23 1930 05/14/23 2030  BP: 114/82 102/63 102/73 106/65  Blood pressure is low , we will currently hold patient's blood pressure home medication regimen which includes Imdur 30, losartan 100, metoprolol 25 mg. Anemia    Latest Ref Rng & Units 05/14/2023    7:03 PM 01/05/2022    4:52 AM 01/05/2022   12:57 AM  CBC  WBC 4.0 - 10.5 K/uL 10.5  9.8  10.5   Hemoglobin 12.0 - 15.0 g/dL 8.6  16.1  09.6   Hematocrit 36.0 - 46.0 % 27.2  33.1  36.7   Platelets 150 - 400 K/uL 318  262  279     Pneumonia Patient received Rocephin azithromycin for community-acquired pneumonia. Will continue same and obtain a procalcitonin level.   Will obtain a swallow evaluation as well.  Additional CT imaging as needed. Emphysema/COPD (HCC) PRN albuterol nebs.     Prognosis: ***  DVT prophylaxis:  ***  Consults:  ***  Advance Care Planning:    Code Status: Full Code   Family Communication:  ***  Disposition Plan:  ***  Severity of Illness: The appropriate patient status for this patient is INPATIENT. Inpatient status is judged to be reasonable and necessary in order to provide the required intensity of service to ensure the patient's safety. The patient's presenting symptoms, physical exam findings, and initial radiographic and laboratory data in the context of their chronic comorbidities is felt to place them at  high risk for further clinical deterioration. Furthermore, it is not anticipated that the patient will be medically stable for discharge from the hospital within 2 midnights of admission.   * I certify that at the point of admission it is my clinical judgment  that the patient will require inpatient hospital care spanning beyond 2 midnights from the point of admission due to high intensity of service, high risk for further deterioration and high frequency of surveillance required.*  Author: Gertha Calkin, MD 05/14/2023 9:51 PM  For on call review [email or web address].

## 2023-05-14 NOTE — H&P (Signed)
History and Physical    Patient: Terri Burns Terri Burns:[id number] DOB: 12/30/1950 DOA: 05/14/2023 DOS: the patient was seen and examined on 05/14/2023 PCP: Sherrin Daisy Danton Clap, MD  Patient coming from: Home  Chief Complaint:  Chief Complaint  Patient presents with   Shortness of Breath        HPI: Terri Burns is a 72 y.o. female with medical history significant for COPD, asthma, depression, hypertension, dyslipidemia, CAD status post PCI and stent, and GERD coming for SOB. Shortness of started last night at 3 AM and has progressively gotten worse with no relief of baseline neb treatments and inhalers at home.  Patient did receive Solu-Medrol via EMS along with a DuoNeb treatment and on arrival was noted to have respiratory rate of 22 patient does not meet SIRS criteria.  In emergency room vitals trend shows: Vitals:   05/14/23 1858 05/14/23 1900 05/14/23 1930 05/14/23 2030  BP:  102/63 102/73 106/65  Pulse:  80 81 79  Temp: 98.1 F (36.7 C)     Resp:  20 18 16   Height:      Weight:      SpO2:  99% 100% 100%  TempSrc: Oral     BMI (Calculated):      >>EKG shows:Sinus rhythm at 83 PR 150 significant Q waves in leads II, III and aVF, QTc of 469. >>2 D Echo 10/2021: 1. Left ventricular ejection fraction, by estimation, is 60 to 65% . Left ventricular ejection fraction by 2D MOD biplane is 67. 0 % . The left ventricle has normal function. The left ventricle has no regional wall motion abnormalities. Left ventricular diastolic parameters are consistent with Grade I diastolic dysfunction ( impaired relaxation) . The average left ventricular global longitudinal strain is - 17. 5 % . 2. Right ventricular systolic function is normal. The right ventricular size is normal. 3. The mitral valve is normal in structure. Mild mitral valve regurgitation. 4. The aortic valve was not well visualized. Aortic valve regurgitation is trivial. 5. The inferior vena cava is dilated in size with > 50%  respiratory variability, suggesting right atrial pressure of 8 mmHg.  >>IMPRESSION: Patchy airspace opacity over the right upper lobe concerning for pneumonia. Followup PA and lateral chest X-ray is recommended in 3-4 weeks following trial of antibiotic therapy to ensure resolution and exclude underlying malignancy.    >>Allergies to levaquin/ mobic / penicillin / nsaids Chest x-ray positive for right upper lobe infiltrate. Labs are notable for : - BMP shows co2 19. - CBC shows anemia with hemoglobin of 8.6 and RDW 17.7 - Viral panel pending.  In the ED pt received: Medications  cefTRIAXone (ROCEPHIN) 2 g in sodium chloride 0.9 % 100 mL IVPB (0 g Intravenous Stopped 05/14/23 2107)  azithromycin (ZITHROMAX) 500 mg in sodium chloride 0.9 % 250 mL IVPB (500 mg Intravenous New Bag/Given 05/14/23 2114)  citalopram (CELEXA) tablet 10 mg (has no administration in time range)  aspirin EC tablet 81 mg (has no administration in time range)  atorvastatin (LIPITOR) tablet 80 mg (has no administration in time range)  ticagrelor (BRILINTA) tablet 90 mg (has no administration in time range)  acetaminophen (TYLENOL) tablet 650 mg (has no administration in time range)    Or  acetaminophen (TYLENOL) suppository 650 mg (has no administration in time range)  HYDROcodone-acetaminophen (NORCO/VICODIN) 5-325 MG per tablet 1-2 tablet (has no administration in time range)  morphine (PF) 2 MG/ML injection 2 mg (has no administration in time range)  ondansetron (  ZOFRAN) tablet 4 mg (has no administration in time range)    Or  ondansetron (ZOFRAN) injection 4 mg (has no administration in time range)  albuterol (PROVENTIL) (2.5 MG/3ML) 0.083% nebulizer solution 2.5 mg (has no administration in time range)  guaiFENesin (MUCINEX) 12 hr tablet 600 mg (has no administration in time range)  hydrALAZINE (APRESOLINE) injection 5 mg (has no administration in time range)  albuterol (PROVENTIL) (2.5 MG/3ML) 0.083% nebulizer  solution 5 mg (5 mg Nebulization Given 05/14/23 1906)  magnesium sulfate IVPB 2 g 50 mL (0 g Intravenous Stopped 05/14/23 2007)   Review of Systems  Respiratory:  Positive for cough, shortness of breath and wheezing.   Neurological:  Positive for weakness.   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    Hyperlipidemia    Hypertension    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   Past Surgical History:  Procedure Laterality Date   CORONARY STENT INTERVENTION N/A 02/22/2021   Procedure: CORONARY STENT INTERVENTION;  Surgeon: Iran Ouch, MD;  Location: [facility] INVASIVE CV LAB;  Service: Cardiovascular;  Laterality: N/A;   LEFT HEART CATH AND CORONARY ANGIOGRAPHY N/A 02/22/2021   Procedure: LEFT HEART CATH AND CORONARY ANGIOGRAPHY;  Surgeon: Iran Ouch, MD;  Location: [facility] INVASIVE CV LAB;  Service: Cardiovascular;  Laterality: N/A;   NECK SURGERY N/A 10/2003   SHOULDER SURGERY Left 2004    reports that she has been smoking cigarettes. She started smoking about 49 years ago. She has a 12.5 pack-year smoking history. She has never used smokeless tobacco. She reports current alcohol use. She reports that she does not use drugs.  Allergies  Allergen Reactions   Levofloxacin Shortness Of Breath   Penicillins Hives    itching   Meloxicam     dizziness   Nsaids Other (See Comments)    Patient states she can tolerate up to three doses per day without incident    Family History  Problem Relation Age of Onset   Hypertension Mother    Stroke Mother    Heart disease Father    Diabetes Father    Heart disease Daughter    Pulmonary embolism Daughter    Hypertension Son    Breast cancer Neg Hx     Prior to Admission medications   Medication Sig Start Date  End Date Taking? Authorizing Provider  ACCU-CHEK GUIDE test strip USE AS DIRECTED TO Check blood glucose 03/25/22   [provider]  Accu-Chek Softclix Lancets lancets as directed. Patient not taking: Reported on 05/05/2022 03/25/22   [provider]  albuterol (PROVENTIL) (2.5 MG/3ML) 0.083% nebulizer solution Take 3 mLs (2.5 mg total) by nebulization 4 (four) times daily. 01/07/22   Calvert Cantor, MD  ALPRAZolam Prudy Feeler) 0.25 MG tablet Take 0.25 mg by mouth 2 (two) times daily as needed. 11/18/21   [provider]  amLODipine (NORVASC) 10 MG tablet Take 1 tablet (10 mg total) by mouth daily. 01/07/22 01/07/23  Calvert Cantor, MD  aspirin 81 MG EC tablet TAKE 1 TABLET BY MOUTH EVERY DAY 04/22/19   Alba Cory, MD  atorvastatin (LIPITOR) 20 MG tablet Take 20 mg by mouth every morning. Patient not taking: Reported on 05/05/2022 11/18/21   [provider]  atorvastatin (LIPITOR) 40  MG tablet Take 2 tablets (80 mg total) by mouth daily. 09/07/21   Alba Cory, MD  atorvastatin (LIPITOR) 80 MG tablet  11/18/21   [provider]  azithromycin (ZITHROMAX) 250 MG tablet Take 250 mg by mouth daily. 03/25/22   [provider]  Blood Glucose Monitoring Suppl (ACCU-CHEK GUIDE) w/Device KIT USE AS DIRECTED TO Check blood glucose 03/25/22   [provider]  BRILINTA 90 MG TABS tablet TAKE ONE TABLET BY MOUTH EVERY MORNING and TAKE ONE TABLET BY MOUTH EVERYDAY AT BEDTIME 02/15/22   Iran Ouch, MD  citalopram (CELEXA) 20 MG tablet TAKE 1 AND 1/2 TABLETS BY MOUTH EVERY EVENING 12/08/21   Carlynn Purl, Danna Hefty, MD  fluticasone (FLONASE) 50 MCG/ACT nasal spray PLACE TWO SPRAYS into BOTH nostrils DAILY 12/03/21   Alba Cory, MD  Fluticasone-Umeclidin-Vilant (TRELEGY ELLIPTA) 100-62.5-25 MCG/ACT AEPB Inhale 1 puff into the lungs daily. 10/05/21   Alba Cory, MD  ipratropium-albuterol (DUONEB) 0.5-2.5 (3) MG/3ML SOLN Take by nebulization. 03/11/22    [provider]  isosorbide mononitrate (IMDUR) 30 MG 24 hr tablet TAKE 1/2 TABLET BY MOUTH ONCE DAILY 12/06/21   Alba Cory, MD  loratadine (ALLERGY RELIEF) 10 MG tablet Take 1 tablet (10 mg total) by mouth daily. 09/07/21   Alba Cory, MD  losartan (COZAAR) 100 MG tablet Take 1 tablet (100 mg total) by mouth every evening. 10/05/21   Alba Cory, MD  metoprolol succinate (TOPROL-XL) 25 MG 24 hr tablet Take 1 tablet (25 mg total) by mouth every evening. 10/05/21   Alba Cory, MD  NARCAN 4 MG/0.1ML LIQD nasal spray kit 1 spray as needed. As needed in case of overdose Patient not taking: Reported on 05/05/2022 04/13/21   [provider]  pantoprazole (PROTONIX) 40 MG tablet Take 1 tablet (40 mg total) by mouth every evening. 10/05/21   Alba Cory, MD  predniSONE (DELTASONE) 10 MG tablet 60 mg tomorrow, taper by 10 mg daily until complete Patient not taking: Reported on 05/05/2022 01/07/22   Calvert Cantor, MD  predniSONE (DELTASONE) 20 MG tablet Take 20 mg by mouth 2 (two) times daily. Patient not taking: Reported on 05/05/2022 03/11/22   [provider]  traMADol (ULTRAM) 50 MG tablet Take 1 tablet (50 mg total) by mouth 3 (three) times daily. 10/05/21   Alba Cory, MD  VENTOLIN HFA 108 (90 Base) MCG/ACT inhaler INHALE 1-2 PUFFS BY MOUTH into THE lungs EVERY 6 HOURS AS NEEDED FOR FOR WHEEZING AND/OR SHORTNESS OF BREATH 10/05/21   Alba Cory, MD   Vitals:   05/14/23 1858 05/14/23 1900 05/14/23 1930 05/14/23 2030  BP:  102/63 102/73 106/65  Pulse:  80 81 79  Resp:  20 18 16   Temp: 98.1 F (36.7 C)     TempSrc: Oral     SpO2:  99% 100% 100%  Weight:      Height:       Physical Exam Vitals and nursing note reviewed.  Constitutional:      General: She is not in acute distress.    Interventions: Nasal cannula in place.  HENT:     Head: Normocephalic and atraumatic.     Right Ear: Hearing normal.     Left Ear: Hearing normal.     Nose: Nose  normal. No nasal deformity.     Mouth/Throat:     Lips: Pink.     Tongue: No lesions.     Pharynx: Oropharynx is clear.  Eyes:     General: Lids are  normal.     Extraocular Movements: Extraocular movements intact.  Cardiovascular:     Rate and Rhythm: Normal rate and regular rhythm.     Heart sounds: Normal heart sounds.  Pulmonary:     Effort: Pulmonary effort is normal.     Breath sounds: Examination of the right-middle field reveals wheezing. Examination of the left-middle field reveals wheezing. Examination of the right-lower field reveals wheezing. Examination of the left-lower field reveals wheezing. Wheezing present.  Abdominal:     General: Bowel sounds are normal. There is no distension.     Palpations: Abdomen is soft. There is no mass.     Tenderness: There is no abdominal tenderness.  Musculoskeletal:     Right lower leg: No edema.     Left lower leg: No edema.  Skin:    General: Skin is warm.  Neurological:     General: No focal deficit present.     Mental Status: She is alert and oriented to person, place, and time.     Cranial Nerves: Cranial nerves 2-12 are intact.  Psychiatric:        Attention and Perception: Attention normal.        Mood and Affect: Mood normal.        Speech: Speech normal.        Behavior: Behavior normal. Behavior is cooperative.    Labs on Admission: I have personally reviewed following labs and imaging studies Results for orders placed or performed during the hospital encounter of 05/14/23 (from the past 24 hour(s))  Basic metabolic panel     Status: Abnormal   Collection Time: 05/14/23  7:03 PM  Result Value Ref Range   Sodium 139 135 - 145 mmol/L   Potassium 4.0 3.5 - 5.1 mmol/L   Chloride 110 98 - 111 mmol/L   CO2 19 (L) 22 - 32 mmol/L   Glucose, Bld 96 70 - 99 mg/dL   BUN 11 8 - 23 mg/dL   Creatinine, Ser 1.61 0.44 - 1.00 mg/dL   Calcium 8.2 (L) 8.9 - 10.3 mg/dL   GFR, Estimated >09 >60 mL/min   Anion gap 10 5 - 15  CBC  with Differential     Status: Abnormal   Collection Time: 05/14/23  7:03 PM  Result Value Ref Range   WBC 10.5 4.0 - 10.5 K/uL   RBC 3.41 (L) 3.87 - 5.11 MIL/uL   Hemoglobin 8.6 (L) 12.0 - 15.0 g/dL   HCT 45.4 (L) 09.8 - 11.9 %   MCV 79.8 (L) 80.0 - 100.0 fL   MCH 25.2 (L) 26.0 - 34.0 pg   MCHC 31.6 30.0 - 36.0 g/dL   RDW 14.7 (H) 82.9 - 56.2 %   Platelets 318 150 - 400 K/uL   nRBC 0.0 0.0 - 0.2 %   Neutrophils Relative % 64 %   Neutro Abs 6.9 1.7 - 7.7 K/uL   Lymphocytes Relative 23 %   Lymphs Abs 2.4 0.7 - 4.0 K/uL   Monocytes Relative 9 %   Monocytes Absolute 1.0 0.1 - 1.0 K/uL   Eosinophils Relative 2 %   Eosinophils Absolute 0.2 0.0 - 0.5 K/uL   Basophils Relative 1 %   Basophils Absolute 0.1 0.0 - 0.1 K/uL   Immature Granulocytes 1 %   Abs Immature Granulocytes 0.06 0.00 - 0.07 K/uL  Hepatic function panel     Status: Abnormal   Collection Time: 05/14/23  7:03 PM  Result Value Ref Range   Total Protein  7.1 6.5 - 8.1 g/dL   Albumin 3.3 (L) 3.5 - 5.0 g/dL   AST 33 15 - 41 U/L   ALT 29 0 - 44 U/L   Alkaline Phosphatase 68 38 - 126 U/L   Total Bilirubin 1.0 <1.2 mg/dL   Bilirubin, Direct 0.2 0.0 - 0.2 mg/dL   Indirect Bilirubin 0.8 0.3 - 0.9 mg/dL  Brain natriuretic peptide     Status: Abnormal   Collection Time: 05/14/23  7:03 PM  Result Value Ref Range   B Natriuretic Peptide 156.4 (H) 0.0 - 100.0 pg/mL  SARS Coronavirus 2 by RT PCR (hospital order, performed in [facility] hospital lab) *cepheid single result test* Anterior Nasal Swab     Status: None   Collection Time: 05/14/23  7:50 PM   Specimen: Anterior Nasal Swab  Result Value Ref Range   SARS Coronavirus 2 by RT PCR NEGATIVE NEGATIVE  CBC: Recent Labs  Lab 05/14/23 1903  WBC 10.5  NEUTROABS 6.9  HGB 8.6*  HCT 27.2*  MCV 79.8*  PLT 318   Basic Metabolic Panel: Recent Labs  Lab 05/14/23 1903  NA 139  K 4.0  CL 110  CO2 19*  GLUCOSE 96  BUN 11  CREATININE 0.71  CALCIUM 8.2*   Unresulted  Labs (From admission, onward)     Start     Ordered   05/15/23 0500  Comprehensive metabolic panel  Tomorrow morning,   R        05/14/23 2151   05/15/23 0500  CBC  Tomorrow morning,   R        05/14/23 2151   05/14/23 2138  Strep pneumoniae urinary antigen  (COPD / Pneumonia / Cellulitis / Lower Extremity Wound)  Once,   R        05/14/23 2151   05/14/23 2101  Lactic acid, plasma  (Lactic Acid)  STAT Now then every 3 hours,   STAT      05/14/23 2100   05/14/23 2059  Procalcitonin  ONCE - URGENT,   URGENT       References:    Procalcitonin Lower Respiratory Tract Infection AND Sepsis Procalcitonin Algorithm   05/14/23 2058   05/14/23 2059  D-dimer, quantitative  ONCE - STAT,   STAT        05/14/23 2058           Medications  cefTRIAXone (ROCEPHIN) 2 g in sodium chloride 0.9 % 100 mL IVPB (0 g Intravenous Stopped 05/14/23 2107)  azithromycin (ZITHROMAX) 500 mg in sodium chloride 0.9 % 250 mL IVPB (500 mg Intravenous New Bag/Given 05/14/23 2114)  citalopram (CELEXA) tablet 10 mg (has no administration in time range)  aspirin EC tablet 81 mg (has no administration in time range)  atorvastatin (LIPITOR) tablet 80 mg (has no administration in time range)  ticagrelor (BRILINTA) tablet 90 mg (has no administration in time range)  acetaminophen (TYLENOL) tablet 650 mg (has no administration in time range)    Or  acetaminophen (TYLENOL) suppository 650 mg (has no administration in time range)  HYDROcodone-acetaminophen (NORCO/VICODIN) 5-325 MG per tablet 1-2 tablet (has no administration in time range)  morphine (PF) 2 MG/ML injection 2 mg (has no administration in time range)  ondansetron (ZOFRAN) tablet 4 mg (has no administration in time range)    Or  ondansetron (ZOFRAN) injection 4 mg (has no administration in time range)  albuterol (PROVENTIL) (2.5 MG/3ML) 0.083% nebulizer solution 2.5 mg (has no administration in time range)  guaiFENesin (MUCINEX)  12 hr tablet 600 mg (has no  administration in time range)  hydrALAZINE (APRESOLINE) injection 5 mg (has no administration in time range)  albuterol (PROVENTIL) (2.5 MG/3ML) 0.083% nebulizer solution 5 mg (5 mg Nebulization Given 05/14/23 1906)  magnesium sulfate IVPB 2 g 50 mL (0 g Intravenous Stopped 05/14/23 2007)   Radiological Exams on Admission: DG Chest Portable 1 View  Result Date: 05/14/2023 CLINICAL DATA:  Shortness of breath, cough EXAM: PORTABLE CHEST 1 VIEW COMPARISON:  01/05/2022 FINDINGS: Heart and mediastinal contours within normal limits. Aortic atherosclerosis. Patchy opacity projects over the right upper lobe. Nodular densities project over the left lower lung compatible with bronchiectasis and scarring seen on prior CT. No effusions. No acute bony abnormality. IMPRESSION: Patchy airspace opacity over the right upper lobe concerning for pneumonia. Followup PA and lateral chest X-ray is recommended in 3-4 weeks following trial of antibiotic therapy to ensure resolution and exclude underlying malignancy. Electronically Signed   By: Charlett Nose M.D.   On: 05/14/2023 19:37     Data Reviewed: Relevant notes from primary care and specialist visits, past discharge summaries as available in EHR, including Care Everywhere. Prior diagnostic testing as pertinent to current admission diagnoses Updated medications and problem lists for reconciliation ED course, including vitals, labs, imaging, treatment and response to treatment Triage notes, nursing and pharmacy notes and ED provider's notes Notable results as noted in HPI  Assessment & Plan SOB (shortness of breath) Secondary to pneumonia as noted on chest x-ray, treating the underlying cause.  Supportive care with supplemental oxygen for comfort or hypoxia as needed.  As needed inhalers and DuoNeb therapy as needed.  Ambulating pulse ox prior to discharge to determine any worsening of existing underlying emphysema.  Will also obtain a D-dimer troponin BNP and  further evaluate.  CT angio negative for pulmonary embolism but does show bronchiectasis with mucoid impaction.  And peribronchovascular scarring that is stable patient also has a new nodular area in the right upper lobe that could be infectious or inflammatory with recommendations of repeating a CT in 3 to 6 months.  Procalcitonin is pending. Tobacco abuse Nicotine patch and counseling once patient is stable. GERD (gastroesophageal reflux disease) IV PPI therapy is for aspiration precaution.  Swallow evaluation to further identify if this is more aspiration or reflux related pneumonia or community-acquired pneumonia. Benign essential HTN Vitals:   05/14/23 1852 05/14/23 1900 05/14/23 1930 05/14/23 2030  BP: 114/82 102/63 102/73 106/65  Blood pressure is low , we will currently hold patient's blood pressure home medication regimen which includes Imdur 30, losartan 100, metoprolol 25 mg. Anemia    Latest Ref Rng & Units 05/14/2023    7:03 PM 01/05/2022    4:52 AM 01/05/2022   12:57 AM  CBC  WBC 4.0 - 10.5 K/uL 10.5  9.8  10.5   Hemoglobin 12.0 - 15.0 g/dL 8.6  40.9  81.1   Hematocrit 36.0 - 46.0 % 27.2  33.1  36.7   Platelets 150 - 400 K/uL 318  262  279     Pneumonia Patient received Rocephin azithromycin for community-acquired pneumonia. Will continue same and obtain a procalcitonin level.   Will obtain a swallow evaluation as well.  Additional CT imaging as needed. Emphysema/COPD (HCC) PRN albuterol nebs.     Prognosis: Stable  DVT prophylaxis:  SCDs  Consults:  None  Advance Care Planning:    Code Status: Full Code   Family Communication:  None  Disposition Plan:  Home  Severity of Illness: The appropriate patient status for this patient is INPATIENT. Inpatient status is judged to be reasonable and necessary in order to provide the required intensity of service to ensure the patient's safety. The patient's presenting symptoms, physical exam findings, and initial  radiographic and laboratory data in the context of their chronic comorbidities is felt to place them at high risk for further clinical deterioration. Furthermore, it is not anticipated that the patient will be medically stable for discharge from the hospital within 2 midnights of admission.   * I certify that at the point of admission it is my clinical judgment that the patient will require inpatient hospital care spanning beyond 2 midnights from the point of admission due to high intensity of service, high risk for further deterioration and high frequency of surveillance required.*  Author: Gertha Calkin, MD 05/14/2023 9:51 PM  For on call review [email or web address].  Orders Placed This Encounter  Procedures   SARS Coronavirus 2 by RT PCR (hospital order, performed in [facility] hospital lab) *cepheid single result test* Anterior Nasal Swab    Standing Status:   Standing    Number of Occurrences:   1   DG Chest Portable 1 View    Standing Status:   Standing    Number of Occurrences:   1    Order Specific Question:   Reason for Exam (SYMPTOM  OR DIAGNOSIS REQUIRED)    Answer:   sob, cough   CT Angio Chest Pulmonary Embolism (PE) W or WO Contrast    Standing Status:   Standing    Number of Occurrences:   1    Order Specific Question:   Does the patient have a contrast media/X-ray dye allergy?    Answer:   No    Order Specific Question:   If indicated for the ordered procedure, I authorize the administration of contrast media per Radiology protocol    Answer:   Yes   Basic metabolic panel    Standing Status:   Standing    Number of Occurrences:   1   CBC with Differential    Standing Status:   Standing    Number of Occurrences:   1   Procalcitonin    Standing Status:   Standing    Number of Occurrences:   1   Hepatic function panel    Standing Status:   Standing    Number of Occurrences:   1   Lactic acid, plasma    Standing Status:   Standing    Number of Occurrences:   2   Brain  natriuretic peptide    Standing Status:   Standing    Number of Occurrences:   1   Strep pneumoniae urinary antigen    Standing Status:   Standing    Number of Occurrences:   1   Comprehensive metabolic panel    Standing Status:   Standing    Number of Occurrences:   1   CBC    Standing Status:   Standing    Number of Occurrences:   1   D-dimer, quantitative    THIS TEST CAN'T BE ADDED NO BLUE TOP COLLECTED OR SEND TO LAB PLEASE DRAW THANK YOU    Standing Status:   Standing    Number of Occurrences:   1   Diet Heart Room service appropriate? Yes; Fluid consistency: Thin    Standing Status:   Standing    Number of Occurrences:   1  Order Specific Question:   Room service appropriate?    Answer:   Yes    Order Specific Question:   Fluid consistency:    Answer:   Thin   Apply Pneumonia Care Plan    Standing Status:   Standing    Number of Occurrences:   1   Cardiac Monitoring Continuous x 24 hours Indications for use: Other; other indications for use: PNA    Standing Status:   Standing    Number of Occurrences:   1    Order Specific Question:   Indications for use:    Answer:   Other    Order Specific Question:   other indications for use:    Answer:   PNA   Vital signs    Standing Status:   Standing    Number of Occurrences:   1   Notify physician (specify)    Standing Status:   Standing    Number of Occurrences:   20    Order Specific Question:   Notify Physician    Answer:   for pulse less than 55 or greater than 120    Order Specific Question:   Notify Physician    Answer:   for respiratory rate less than 12 or greater than 25    Order Specific Question:   Notify Physician    Answer:   for temperature greater than 100.5 F    Order Specific Question:   Notify Physician    Answer:   for urinary output less than 30 mL/hr for four hours    Order Specific Question:   Notify Physician    Answer:   for systolic BP less than 90 or greater than 160, diastolic BP less than 60  or greater than 100    Order Specific Question:   Notify Physician    Answer:   for new hypoxia w/ oxygen saturations < 88%   Mobility Protocol: No Restrictions    RN to initiate protocols based on patient's level of care    Standing Status:   Standing    Number of Occurrences:   1   Refer to Sidebar Report Refer to ICU, Med-Surg, Progressive, and Step-Down Mobility Protocol Sidebars    Refer to ICU, Med-Surg, Progressive, and Step-Down Mobility Protocol Sidebars    Standing Status:   Standing    Number of Occurrences:   1   If patient diabetic or glucose greater than 140 notify physician for Sliding Scale Insulin Orders    Standing Status:   Standing    Number of Occurrences:   20   Intake and Output    Standing Status:   Standing    Number of Occurrences:   1   Do not place and if present remove PureWick    Standing Status:   Standing    Number of Occurrences:   1   Initiate Oral Care Protocol    Standing Status:   Standing    Number of Occurrences:   1   Initiate Carrier Fluid Protocol    Standing Status:   Standing    Number of Occurrences:   1   RN may order General Admission PRN Orders utilizing "General Admission PRN medications" (through manage orders) for the following patient needs: allergy symptoms (Claritin), cold sores (Carmex), cough (Robitussin DM), eye irritation (Liquifilm Tears), hemorrhoids (Tucks), indigestion (Maalox), minor skin irritation (Hydrocortisone Cream), muscle pain (Ben Gay), nose irritation (saline nasal spray) and sore throat (Chloraseptic spray).  Standing Status:   Standing    Number of Occurrences:   [phone]   SCDs    Standing Status:   Standing    Number of Occurrences:   1    Order Specific Question:   Laterality    Answer:   Bilateral   Swallow screen    Standing Status:   Standing    Number of Occurrences:   1   Full code    Standing Status:   Standing    Number of Occurrences:   1    Order Specific Question:   By:    Answer:   Other    Consult to hospitalist    Standing Status:   Standing    Number of Occurrences:   1    Order Specific Question:   Place call to:    Answer:   ED, callback [phone]    Order Specific Question:   Reason for Consult    Answer:   Admit    Order Specific Question:   Diagnosis/Clinical Info for Consult:    Answer:   pna, copd exac   OT eval and treat    Standing Status:   Standing    Number of Occurrences:   1   PT eval and treat    Standing Status:   Standing    Number of Occurrences:   1   Pulse oximetry check with vital signs    Standing Status:   Standing    Number of Occurrences:   1   Oxygen therapy Mode or (Route): Nasal cannula; Liters Per Minute: 2; Keep O2 saturation between: greater than 92 %    Standing Status:   Standing    Number of Occurrences:   1    Order Specific Question:   Mode or (Route)    Answer:   Nasal cannula    Order Specific Question:   Liters Per Minute    Answer:   2    Order Specific Question:   Keep O2 saturation between    Answer:   greater than 92 %   Incentive spirometry    Standing Status:   Standing    Number of Occurrences:   1   Chest physiotherapy    Standing Status:   Standing    Number of Occurrences:   120    Order Specific Question:   Type    Answer:   Manual    Order Specific Question:   Type    Answer:   Bed   ED EKG    Standing Status:   Standing    Number of Occurrences:   1    Order Specific Question:   Reason for Exam    Answer:   Shortness of breath   EKG 12-Lead    Standing Status:   Standing    Number of Occurrences:   1   Admit to Inpatient (patient's expected length of stay will be greater than 2 midnights or inpatient only procedure)    Standing Status:   Standing    Number of Occurrences:   1    Order Specific Question:   Hospital Area    Answer:   [facility] [[id number]]    Order Specific Question:   Level of Care    Answer:   Telemetry Medical [104]    Order Specific Question:   Covid  Evaluation    Answer:   Asymptomatic - no recent exposure (last 10 days) testing not required  Order Specific Question:   Diagnosis    Answer:   SOB (shortness of breath) [960454]    Order Specific Question:   Admitting Physician    Answer:   Darrold Junker    Order Specific Question:   Attending Physician    Answer:   Darrold Junker    Order Specific Question:   Certification:    Answer:   I certify this patient will need inpatient services for at least 2 midnights    Order Specific Question:   Expected Medical Readiness    Answer:   05/16/2023   Aspiration precautions    Standing Status:   Standing    Number of Occurrences:   1   Fall precautions    Standing Status:   Standing    Number of Occurrences:   1

## 2023-05-14 NOTE — ED Triage Notes (Signed)
Patient presents via [facility] from home for COPD exacerbation. Per pt, she woke up about 0300 with SOB. Pt used her home nebs with no improvement of symptoms.   EMS administered duoneb and 125mg  solu medrol en route and placed pt on 3L [place] en route.   Audible inspiratory and expiratory wheezing on arrival.

## 2023-05-14 NOTE — Assessment & Plan Note (Addendum)
    Latest Ref Rng & Units 05/14/2023    7:03 PM 01/05/2022    4:52 AM 01/05/2022   12:57 AM  CBC  WBC 4.0 - 10.5 K/uL 10.5  9.8  10.5   Hemoglobin 12.0 - 15.0 g/dL 8.6  52.8  41.3   Hematocrit 36.0 - 46.0 % 27.2  33.1  36.7   Platelets 150 - 400 K/uL 318  262  279

## 2023-05-14 NOTE — ED Provider Notes (Signed)
[facility] Provider Note    Event Date/Time   First MD Initiated Contact with Patient 05/14/23 1850     (approximate)   History   Chief Complaint: Shortness of Breath (/)   HPI  Terri Burns is a 72 y.o. female with a history of hypertension, COPD who comes ED complaining of worsening shortness of breath for the past week, gradual onset, denies chest pain.  Has cough as well.  No fever or chills.  Today since 3:00 AM the shortness of breath has been unbearable.  She has tried nebulizer treatments at home without relief.  She also has Trelegy which has not been helping.  EMS gave Solu-Medrol 125 and a DuoNeb during transport.          Physical Exam   Triage Vital Signs: ED Triage Vitals  Encounter Vitals Group     BP 05/14/23 1852 114/82     Systolic BP Percentile --      Diastolic BP Percentile --      Pulse Rate 05/14/23 1852 84     Resp 05/14/23 1852 (!) 22     Temp --      Temp Source 05/14/23 1852 Oral     SpO2 05/14/23 1852 100 %     Weight 05/14/23 1853 118 lb (53.5 kg)     Height 05/14/23 1853 4' 10.5" (1.486 m)     Head Circumference --      Peak Flow --      Pain Score 05/14/23 1854 4     Pain Loc --      Pain Education --      Exclude from Growth Chart --     Most recent vital signs: Vitals:   05/14/23 1930 05/14/23 2030  BP: 102/73 106/65  Pulse: 81 79  Resp: 18 16  Temp:    SpO2: 100% 100%    General: Awake, Mild respiratory distress.  Oxygen saturation 99% on room air CV:  Good peripheral perfusion.  Regular rate and rhythm Resp:  Mild tachypnea, respiratory rate of 22.  Diffuse expiratory wheezing and prolonged expiratory phase. Abd:  No distention.  Soft nontender Other:  Trace pitting edema to bilateral lower extremities, symmetric.  No calf tenderness.   ED Results / Procedures / Treatments   Labs (all labs ordered are listed, but only abnormal results are displayed) Labs Reviewed  BASIC METABOLIC PANEL  - Abnormal; Notable for the following components:      Result Value   CO2 19 (*)    Calcium 8.2 (*)    All other components within normal limits  CBC WITH DIFFERENTIAL/PLATELET - Abnormal; Notable for the following components:   RBC 3.41 (*)    Hemoglobin 8.6 (*)    HCT 27.2 (*)    MCV 79.8 (*)    MCH 25.2 (*)    RDW 17.7 (*)    All other components within normal limits  SARS CORONAVIRUS 2 BY RT PCR  PROCALCITONIN  D-DIMER, QUANTITATIVE  HEPATIC FUNCTION PANEL  LACTIC ACID, PLASMA  LACTIC ACID, PLASMA  BRAIN NATRIURETIC PEPTIDE     EKG Interpreted by me Sinus rhythm rate of 83.  Normal axis, normal intervals.  Normal QRS ST segments and T waves.   RADIOLOGY Chest x-ray interpreted by me, shows right upper lobe infiltrate.  Radiology report reviewed   PROCEDURES:  Procedures   MEDICATIONS ORDERED IN ED: Medications  cefTRIAXone (ROCEPHIN) 2 g in sodium chloride 0.9 % 100 mL  IVPB (2 g Intravenous New Bag/Given 05/14/23 2037)  azithromycin (ZITHROMAX) 500 mg in sodium chloride 0.9 % 250 mL IVPB (has no administration in time range)  albuterol (PROVENTIL) (2.5 MG/3ML) 0.083% nebulizer solution 5 mg (5 mg Nebulization Given 05/14/23 1906)  magnesium sulfate IVPB 2 g 50 mL (0 g Intravenous Stopped 05/14/23 2007)     IMPRESSION / MDM / ASSESSMENT AND PLAN / ED COURSE  I reviewed the triage vital signs and the nursing notes.  DDx: COPD exacerbation, pneumonia, pleural effusion, pulmonary edema, AKI, electrolyte abnormality, pulmonary embolism, pneumothorax  Patient's presentation is most consistent with acute presentation with potential threat to life or bodily function.  Patient presents with shortness of breath, clinically most likely COPD exacerbation.  Will obtain labs, chest x-ray, give additional bronchodilators and IV magnesium.   Clinical Course as of 05/14/23 2107  Wynelle Link May 14, 2023  2013 Persistent wheezing, prolonged expirations, diffuse crackles R > L. Cxr  shows CAP. Will start abx and admit. [PS]    Clinical Course User Index [PS] Sharman Cheek, MD    ----------------------------------------- 9:08 PM on 05/14/2023 ----------------------------------------- Case discussed with hospitalist.  Not septic.   FINAL CLINICAL IMPRESSION(S) / ED DIAGNOSES   Final diagnoses:  COPD exacerbation (HCC)  Community acquired pneumonia of right upper lobe of lung     Rx / DC Orders   ED Discharge Orders     None        Note:  This document was prepared using Dragon voice recognition software and may include unintentional dictation errors.   Sharman Cheek, MD 05/14/23 2108

## 2023-05-14 NOTE — Assessment & Plan Note (Addendum)
 PRN albuterol nebs

## 2023-05-14 NOTE — Assessment & Plan Note (Addendum)
Patient received Rocephin azithromycin for community-acquired pneumonia. Will continue same and obtain a procalcitonin level.   Will obtain a swallow evaluation as well.  Additional CT imaging as needed.

## 2023-05-14 NOTE — Assessment & Plan Note (Addendum)
IV PPI therapy is for aspiration precaution.  Swallow evaluation to further identify if this is more aspiration or reflux related pneumonia or community-acquired pneumonia.

## 2023-05-14 NOTE — Assessment & Plan Note (Addendum)
 Nicotine patch and counseling once patient is stable.

## 2023-05-14 NOTE — Assessment & Plan Note (Addendum)
Secondary to pneumonia as noted on chest x-ray, treating the underlying cause.  Supportive care with supplemental oxygen for comfort or hypoxia as needed.  As needed inhalers and DuoNeb therapy as needed.  Ambulating pulse ox prior to discharge to determine any worsening of existing underlying emphysema.  Will also obtain a D-dimer troponin BNP and further evaluate.

## 2023-05-14 NOTE — Assessment & Plan Note (Addendum)
Vitals:   05/14/23 1852 05/14/23 1900 05/14/23 1930 05/14/23 2030  BP: 114/82 102/63 102/73 106/65  Blood pressure is low , we will currently hold patient's blood pressure home medication regimen which includes Imdur 30, losartan 100, metoprolol 25 mg.

## 2023-05-15 DIAGNOSIS — R0602 Shortness of breath: Secondary | ICD-10-CM | POA: Diagnosis not present

## 2023-05-15 LAB — BLOOD GAS, VENOUS
Acid-base deficit: 2 mmol/L (ref 0.0–2.0)
Bicarbonate: 23.7 mmol/L (ref 20.0–28.0)
O2 Saturation: 86.4 %
Patient temperature: 37
pCO2, Ven: 43 mm[Hg] — ABNORMAL LOW (ref 44–60)
pH, Ven: 7.35 (ref 7.25–7.43)
pO2, Ven: 55 mm[Hg] — ABNORMAL HIGH (ref 32–45)

## 2023-05-15 LAB — CBC
HCT: 25.4 % — ABNORMAL LOW (ref 36.0–46.0)
Hemoglobin: 8 g/dL — ABNORMAL LOW (ref 12.0–15.0)
MCH: 25.2 pg — ABNORMAL LOW (ref 26.0–34.0)
MCHC: 31.5 g/dL (ref 30.0–36.0)
MCV: 79.9 fL — ABNORMAL LOW (ref 80.0–100.0)
Platelets: 290 10*3/uL (ref 150–400)
RBC: 3.18 MIL/uL — ABNORMAL LOW (ref 3.87–5.11)
RDW: 17.1 % — ABNORMAL HIGH (ref 11.5–15.5)
WBC: 6.9 10*3/uL (ref 4.0–10.5)
nRBC: 0 % (ref 0.0–0.2)

## 2023-05-15 LAB — COMPREHENSIVE METABOLIC PANEL
ALT: 26 U/L (ref 0–44)
AST: 21 U/L (ref 15–41)
Albumin: 3 g/dL — ABNORMAL LOW (ref 3.5–5.0)
Alkaline Phosphatase: 65 U/L (ref 38–126)
Anion gap: 5 (ref 5–15)
BUN: 9 mg/dL (ref 8–23)
CO2: 24 mmol/L (ref 22–32)
Calcium: 8.1 mg/dL — ABNORMAL LOW (ref 8.9–10.3)
Chloride: 108 mmol/L (ref 98–111)
Creatinine, Ser: 0.66 mg/dL (ref 0.44–1.00)
GFR, Estimated: >60 mL/min (ref >60)
Glucose, Bld: 154 mg/dL — ABNORMAL HIGH (ref 70–99)
Potassium: 3.6 mmol/L (ref 3.5–5.1)
Sodium: 137 mmol/L (ref 135–145)
Total Bilirubin: 0.5 mg/dL (ref <1.2)
Total Protein: 6.2 g/dL — ABNORMAL LOW (ref 6.5–8.1)

## 2023-05-15 LAB — TROPONIN I (HIGH SENSITIVITY)
Troponin I (High Sensitivity): 5 ng/L (ref <18)
Troponin I (High Sensitivity): 4 ng/L (ref <18)

## 2023-05-15 LAB — STREP PNEUMONIAE URINARY ANTIGEN: Strep Pneumo Urinary Antigen: POSITIVE — AB

## 2023-05-15 LAB — LACTIC ACID, PLASMA: Lactic Acid, Venous: 0.9 mmol/L (ref 0.5–1.9)

## 2023-05-15 MED ORDER — METHYLPREDNISOLONE SODIUM SUCC 40 MG IJ SOLR
40.0000 mg | Freq: Two times a day (BID) | INTRAMUSCULAR | Status: DC
  Administered 2023-05-15 – 2023-05-17 (×7): 40 mg via INTRAVENOUS
  Filled 2023-05-15 (×7): qty 1

## 2023-05-15 MED ORDER — ACETYLCYSTEINE 20 % IN SOLN
4.0000 mL | Freq: Once | RESPIRATORY_TRACT | Status: AC
  Administered 2023-05-15: 4 mL via RESPIRATORY_TRACT
  Filled 2023-05-15: qty 4

## 2023-05-15 MED ORDER — REVEFENACIN 175 MCG/3ML IN SOLN
175.0000 ug | Freq: Every day | RESPIRATORY_TRACT | Status: DC
  Administered 2023-05-15 – 2023-05-18 (×4): 175 ug via RESPIRATORY_TRACT
  Filled 2023-05-15 (×4): qty 3

## 2023-05-15 MED ORDER — BUDESONIDE 0.25 MG/2ML IN SUSP
0.2500 mg | Freq: Two times a day (BID) | RESPIRATORY_TRACT | Status: DC
  Administered 2023-05-15 – 2023-05-20 (×11): 0.25 mg via RESPIRATORY_TRACT
  Filled 2023-05-15 (×11): qty 2

## 2023-05-15 MED ORDER — ARFORMOTEROL TARTRATE 15 MCG/2ML IN NEBU
15.0000 ug | INHALATION_SOLUTION | Freq: Two times a day (BID) | RESPIRATORY_TRACT | Status: DC
  Administered 2023-05-15 – 2023-05-20 (×11): 15 ug via RESPIRATORY_TRACT
  Filled 2023-05-15 (×12): qty 2

## 2023-05-15 NOTE — Progress Notes (Signed)
Physical Therapy Evaluation Patient Details Name: Terri Burns MRN: [id number] DOB: 1951/01/19 Today's Date: 05/15/2023  History of Present Illness  Terri Burns is a 72 y.o. female with medical history significant for COPD, asthma, depression, hypertension, dyslipidemia, CAD status post PCI and stent, and GERD coming for SOB.  Shortness of started last night at 3 AM and has progressively gotten worse with no relief of baseline neb treatments and inhalers at home   Clinical Impression  Patient received supine in bed upon PT arrival. Prior to admission, patient reports being  IND with mobility and ADLs with use of Rollator. Patient does have intermittent assist from family members/friends. Patient requiring CGA with bed mobility, patient refused additional mobility due to pain/fatigue. Therefore very limited due this. Sp02 stable throughout session,but reports SOB 6/10. Patient returned to supine in bed at end of session, will all needs in reach. Patient will benefit from skilled acute PT services to address functional impairments (see below for additional) and maximize functional mobility. Anticipate the need for follow up PT services upon acute hospital discharge. Will continue to follow acutely        If plan is discharge home, recommend the following: A little help with walking and/or transfers;A little help with bathing/dressing/bathroom;Assist for transportation;Help with stairs or ramp for entrance   Can travel by private vehicle        Equipment Recommendations None recommended by PT  Recommendations for Other Services       Functional Status Assessment Patient has had a recent decline in their functional status and demonstrates the ability to make significant improvements in function in a reasonable and predictable amount of time.     Precautions / Restrictions Precautions Precautions: None Restrictions Weight Bearing Restrictions: No      Mobility  Bed Mobility Overal  bed mobility: Needs Assistance Bed Mobility: Supine to Sit, Sit to Supine     Supine to sit: Contact guard Sit to supine: Contact guard assist   General bed mobility comments: Increased time required, steadying assist, intermittent lean into HOB for support due to fatigue/pain. SOB rated 7/10 seated EOB, with cues for pursed lip breathing. Sp02: 97-98% with bed mobility.    Transfers                   General transfer comment: Pt denied further mobility attempts due to pain/fatigue    Ambulation/Gait               General Gait Details: Pt denied further mobility attempts due to pain/fatigue  Stairs            Wheelchair Mobility     Tilt Bed    Modified Rankin (Stroke Patients Only)       Balance Overall balance assessment: Needs assistance Sitting-balance support: Feet unsupported, Bilateral upper extremity supported Sitting balance-Leahy Scale: Good                                       Pertinent Vitals/Pain Pain Assessment Pain Assessment: No/denies pain    Home Living Family/patient expects to be discharged to:: Private residence Living Arrangements: Alone Available Help at Discharge: Family;Available PRN/intermittently (has grandson and friends that assist PRN) Type of Home: House Home Access: Ramped entrance       Home Layout: One level Home Equipment: Shower seat;Toilet riser;Rollator (4 wheels)      Prior Function Prior Level of  Function : Independent/Modified Independent             Mobility Comments: Pt reports Mod I with use of Rollator for Household/Community Ambulation. Denies fall history. ADLs Comments: IND with ADL's; Reports has intermittent assistance for grocery shopping.     Extremity/Trunk Assessment   Upper Extremity Assessment Upper Extremity Assessment: Generalized weakness (decreased R shoulder ROM at basleine)    Lower Extremity Assessment Lower Extremity Assessment: Generalized  weakness    Cervical / Trunk Assessment Cervical / Trunk Assessment:  (Increased R Lateral head Tilt while seated; Hx of Cervical Fx.) Cervical / Trunk Exceptions: Increased R Lateral head Tilt while seated; Hx of Cervical Fx.  Communication   Communication Communication: No apparent difficulties  Cognition Arousal: Lethargic Behavior During Therapy: WFL for tasks assessed/performed Overall Cognitive Status: Within Functional Limits for tasks assessed                                          General Comments      Exercises Other Exercises Other Exercises: Educated on PT role, breathing techniques, and importance of OOB mobility.   Assessment/Plan    PT Assessment Patient needs continued PT services  PT Problem List Decreased strength;Decreased activity tolerance;Decreased balance;Decreased mobility       PT Treatment Interventions DME instruction;Gait training;Functional mobility training;Therapeutic activities;Therapeutic exercise;Balance training;Neuromuscular re-education    PT Goals (Current goals can be found in the Care Plan section)  Acute Rehab PT Goals Patient Stated Goal: Get back home PT Goal Formulation: With patient Time For Goal Achievement: 05/29/23 Potential to Achieve Goals: Good    Frequency Min 1X/week     Co-evaluation               AM-PAC PT "6 Clicks" Mobility  Outcome Measure Help needed turning from your back to your side while in a flat bed without using bedrails?: A Little Help needed moving from lying on your back to sitting on the side of a flat bed without using bedrails?: A Little Help needed moving to and from a bed to a chair (including a wheelchair)?: A Little Help needed standing up from a chair using your arms (e.g., wheelchair or bedside chair)?: A Little Help needed to walk in hospital room?: A Little Help needed climbing 3-5 steps with a railing? : A Lot 6 Click Score: 17    End of Session Equipment  Utilized During Treatment: Oxygen Activity Tolerance: Patient limited by fatigue;Patient limited by pain Patient left: in bed;with call bell/phone within reach Nurse Communication: Mobility status PT Visit Diagnosis: Muscle weakness (generalized) (M62.81);Unsteadiness on feet (R26.81)    Time: 6440-3474 PT Time Calculation (min) (ACUTE ONLY): 14 min   Charges:   PT Evaluation $PT Eval Low Complexity: 1 Low   PT General Charges $$ ACUTE PT VISIT: 1 Visit         Creed Copper Fairly, PT, DPT 05/15/23 2:22 PM

## 2023-05-15 NOTE — Progress Notes (Signed)
PROGRESS NOTE Terri Burns  ZOX:[id number] DOB: [birth year] DOA: 05/14/2023 PCP: Louis Matte, MD  Brief Narrative/Hospital Course:  32 yof  w/.COPD, asthma, depression, hypertension, dyslipidemia, CAD s/p CABG and stent, G1DD w/ normal LVEF 10/2021, and GERD coming for worsening shortness of breath x 1 week, cough  and got worse  12/8 early am and did not improve with nebulizer , trelegy so presented to the ED. In the ED afebrile, labs with stable CBC CMP Pro-Cal less than 0.1 BNP 156, troponin 5>4. CXR>.Patchy airspace opacity over the right upper lobe concerning for pneumonia" ABG pH 7.3 pCO2 43 CT angio chest no PE, diffuse coronary artery disease, bronchiectasis with mucoid impaction and peribronchovascular scarring new nodular area in the right upper lobe need follow-up in 3 to 2-month. Patient was admitted on IV antibiotics Solu-Medrol.   Subjective: Seen and examined, Overnight vitals/labs/events reviewed  Resting well, breathing improving,   Assessment and Plan: Principal Problem:   SOB (shortness of breath) Active Problems:   Pneumonia   Tobacco abuse   Emphysema/COPD (HCC)   GERD (gastroesophageal reflux disease)   Benign essential HTN   Anemia   Acute COPD exacerbation  Bronchiectasis/mucoid impaction : Pneumonia: Cont ceftriaxone, azithro, Solu-Medrol.  Strep pna ag+  add budesonide, Brovana and Yupelri nebulizer Continue flutter valve 2-4 times per day for mucus clearance, incentive spirometry.   CAD s/p CABG hx HTN HLD: Troponin negative.  No chest pain.  Pta ON Aspirin, Lipitor, Toprol, Brilinta Imdur losartan, amlodipine. HOLD antihypertensives  Tobacco abuse:cont nicotine patch, cessation counseling  GERD: On PPI  Microcytic anemia-hb stable in 8gms. previous baseline and 10 g range since 2023  DVT prophylaxis: SCDs Start: 05/14/23 2139 Code Status:   Code Status: Full Code Family Communication: plan of care discussed with patient at  bedside. Patient status is: Remains hospitalized because of Ciprodex elevation Level of care: Progressive   Dispo: The patient is from: home            Anticipated disposition: home ~2 days Objective: Vitals last 24 hrs: Vitals:   05/15/23 0600 05/15/23 0615 05/15/23 0800 05/15/23 1050  BP: 138/72  137/77   Pulse: 80 81 75   Resp: 19 20 16    Temp:    97.9 F (36.6 C)  TempSrc:    Oral  SpO2: 100% 100% 100%   Weight:      Height:       Weight change:   Physical Examination: General exam: alert awake, older than stated age HEENT:Oral mucosa moist, Ear/Nose WNL grossly Respiratory system: bilaterally diminished BS w/ expiratory wheezing, no use of accessory muscle Cardiovascular system: S1 & S2 +, No JVD. Gastrointestinal system: Abdomen soft,NT,ND, BS+ Nervous System:Alert, awake, moving extremities. Extremities: LE edema neg,distal peripheral pulses palpable.  Skin: No rashes,no icterus. MSK: Normal muscle bulk,tone, power  Medications reviewed:  Scheduled Meds:  aspirin EC  81 mg Oral Daily   atorvastatin  80 mg Oral Daily   citalopram  10 mg Oral Daily   methylPREDNISolone (SOLU-MEDROL) injection  40 mg Intravenous Q12H   ticagrelor  90 mg Oral BID   Continuous Infusions:  azithromycin Stopped (05/14/23 2214)   cefTRIAXone (ROCEPHIN)  IV Stopped (05/14/23 2107)      Diet Order             Diet Heart Room service appropriate? Yes; Fluid consistency: Thin  Diet effective now  Intake/Output Summary (Last 24 hours) at 05/15/2023 1301 Last data filed at 05/15/2023 1050 Gross per 24 hour  Intake 392.5 ml  Output 1 ml  Net 391.5 ml   Net IO Since Admission: 391.5 mL [05/15/23 1301]  Wt Readings from Last 3 Encounters:  05/14/23 53.5 kg  05/11/22 60.3 kg  02/28/22 59.9 kg     Unresulted Labs (From admission, onward)    None     Data Reviewed: I have personally reviewed following labs and imaging studies CBC: Recent  Labs  Lab 05/14/23 1903 05/15/23 0500  WBC 10.5 6.9  NEUTROABS 6.9  --   HGB 8.6* 8.0*  HCT 27.2* 25.4*  MCV 79.8* 79.9*  PLT 318 290   Basic Metabolic Panel:  Recent Labs  Lab 05/14/23 1903 05/15/23 0406  NA 139 137  K 4.0 3.6  CL 110 108  CO2 19* 24  GLUCOSE 96 154*  BUN 11 9  CREATININE 0.71 0.66  CALCIUM 8.2* 8.1*   GFR: Estimated Creatinine Clearance: 47.6 mL/min (by C-G formula based on SCr of 0.66 mg/dL). Liver Function Tests:  Recent Labs  Lab 05/14/23 1903 05/15/23 0406  AST 33 21  ALT 29 26  ALKPHOS 68 65  BILITOT 1.0 0.5  PROT 7.1 6.2*  ALBUMIN 3.3* 3.0*  Sepsis Labs: Recent Labs  Lab 05/14/23 1903 05/14/23 2259 05/15/23 0406  PROCALCITON <0.10  --   --   LATICACIDVEN  --  0.8 0.9   Recent Results (from the past 240 hour(s))  SARS Coronavirus 2 by RT PCR (hospital order, performed in [facility] hospital lab) *cepheid single result test* Anterior Nasal Swab     Status: None   Collection Time: 05/14/23  7:50 PM   Specimen: Anterior Nasal Swab  Result Value Ref Range Status   SARS Coronavirus 2 by RT PCR NEGATIVE NEGATIVE Final    Comment: (NOTE) SARS-CoV-2 target nucleic acids are NOT DETECTED.  The SARS-CoV-2 RNA is generally detectable in upper and lower respiratory specimens during the acute phase of infection. The lowest concentration of SARS-CoV-2 viral copies this assay can detect is 250 copies / mL. A negative result does not preclude SARS-CoV-2 infection and should not be used as the sole basis for treatment or other patient management decisions.  A negative result may occur with improper specimen collection / handling, submission of specimen other than nasopharyngeal swab, presence of viral mutation(s) within the areas targeted by this assay, and inadequate number of viral copies (<250 copies / mL). A negative result must be combined with clinical observations, patient history, and epidemiological information.  Fact Sheet for  Patients:   RoadLapTop.co.za  Fact Sheet for Healthcare Providers: http://kim-Matkins.com/  This test is not yet approved or  cleared by the [place] FDA and has been authorized for detection and/or diagnosis of SARS-CoV-2 by FDA under an Emergency Use Authorization (EUA).  This EUA will remain in effect (meaning this test can be used) for the duration of the COVID-19 declaration under Section 564(b)(1) of the Act, 21 U.S.C. section 360bbb-3(b)(1), unless the authorization is terminated or revoked sooner.  Performed at [facility], [address] [place], [place] [address]     Antimicrobials/Microbiology: Anti-infectives (From admission, onward)    Start     Dose/Rate Route Frequency Ordered Stop   05/14/23 2015  cefTRIAXone (ROCEPHIN) 2 g in sodium chloride 0.9 % 100 mL IVPB        2 g 200 mL/hr over 30 Minutes Intravenous Every 24  hours 05/14/23 2014 05/19/23 2014   05/14/23 2015  azithromycin (ZITHROMAX) 500 mg in sodium chloride 0.9 % 250 mL IVPB        500 mg 250 mL/hr over 60 Minutes Intravenous Every 24 hours 05/14/23 2014 05/19/23 2014      No results found for: "SDES", "SPECREQUEST", "CULT", "REPTSTATUS"   Radiology Studies: CT Angio Chest Pulmonary Embolism (PE) W or WO Contrast  Result Date: 05/15/2023 CLINICAL DATA:  Respiratory distress.  COPD exacerbation. EXAM: CT ANGIOGRAPHY CHEST WITH CONTRAST TECHNIQUE: Multidetector CT imaging of the chest was performed using the standard protocol during bolus administration of intravenous contrast. Multiplanar CT image reconstructions and MIPs were obtained to evaluate the vascular anatomy. RADIATION DOSE REDUCTION: This exam was performed according to the departmental dose-optimization program which includes automated exposure control, adjustment of the mA and/or kV according to patient size and/or use of iterative reconstruction technique. CONTRAST:  75mL OMNIPAQUE  IOHEXOL 350 MG/ML SOLN COMPARISON:  02/28/2022 FINDINGS: Cardiovascular: Heart is normal size. Extensive coronary artery and aortic calcifications. No evidence of aortic aneurysm. No filling defects in the pulmonary arteries to suggest pulmonary emboli. Mediastinum/Nodes: No mediastinal, hilar, or axillary adenopathy. Trachea and esophagus are unremarkable. Thyroid unremarkable. Lungs/Pleura: Centrilobular emphysema. Nodular area and surrounding ground-glass in the right upper lobe is new since prior study, measuring up to 1.8 cm. Mucoid impaction noted in the lower lobe airways. Mild bronchiectasis in the lower lobes. Peribronchovascular nodularity in the mid and lower lungs compatible with scarring, stable since prior study. No effusions. Upper Abdomen: No acute findings Musculoskeletal: Chest wall soft tissues are unremarkable. No acute bony abnormality. Review of the MIP images confirms the above findings. IMPRESSION: No evidence of pulmonary embolus. Diffuse coronary artery disease. Bronchiectasis with mucoid impaction and peribronchovascular scarring, stable. New nodular area in the right upper lobe, favor infectious/inflammatory. This could be followed with repeat CT in 3-6 months. Aortic Atherosclerosis (ICD10-I70.0) and Emphysema (ICD10-J43.9). Electronically Signed   By: Charlett Nose M.D.   On: 05/15/2023 00:25   DG Chest Portable 1 View  Result Date: 05/14/2023 CLINICAL DATA:  Shortness of breath, cough EXAM: PORTABLE CHEST 1 VIEW COMPARISON:  01/05/2022 FINDINGS: Heart and mediastinal contours within normal limits. Aortic atherosclerosis. Patchy opacity projects over the right upper lobe. Nodular densities project over the left lower lung compatible with bronchiectasis and scarring seen on prior CT. No effusions. No acute bony abnormality. IMPRESSION: Patchy airspace opacity over the right upper lobe concerning for pneumonia. Followup PA and lateral chest X-ray is recommended in 3-4 weeks following  trial of antibiotic therapy to ensure resolution and exclude underlying malignancy. Electronically Signed   By: Charlett Nose M.D.   On: 05/14/2023 19:37     LOS: 1 day   Total time spent in review of labs and imaging, patient evaluation, formulation of plan, documentation and communication with family: 50 minutes  Lanae Boast, MD [facility]  05/15/2023, 1:01 PM

## 2023-05-15 NOTE — Evaluation (Signed)
Occupational Therapy Evaluation Patient Details Name: Terri Burns MRN: [id number] DOB: 06/30/1950 Today's Date: 05/15/2023   History of Present Illness Terri Burns is a 72 y.o. female with medical history significant for COPD, asthma, depression, hypertension, dyslipidemia, CAD status post PCI and stent, and GERD coming for SOB.  Shortness of started last night at 3 AM and has progressively gotten worse with no relief of baseline neb treatments and inhalers at home   Clinical Impression   Pt was seen for OT evaluation this date. Prior to hospital admission, pt was living alone and her grandson helps out weekly with housekeeping tasks. Pt reports using her rollator primarily for community mobility and was independent with ADL. Pt notes history of chronic back pain and bending over for LB ADL tasks makes her back hurt more and makes her feel SOB, particularly more lately. Pt presents to acute OT demonstrating impaired ADL performance and functional mobility 2/2 back pain, SOB with exertion, decreased strength, impaired R shoulder ROM, impaired activity tolerance and balance (See OT problem list for additional functional deficits). Pt currently requires SBA-CGA for bed mobility, CGA for LB dressing involving ADL transfers, and CGA and handheld assist for marching in place. Pt unable to leave the side of the bed due to increasing back pain and feeling SOB. SpO2 >94% on 2L with exertion, HR in 80's-90's. Pt educated in role of acute OT, home/routines modifications, and AE/DME for LB ADL tasks to minimize bending over which exaccerbates her back pain and SOB. Pt verbalized understanding. Pt would benefit from skilled OT services to address noted impairments and functional limitations (see below for any additional details) in order to maximize safety and independence while minimizing falls risk and caregiver burden.     If plan is discharge home, recommend the following: A little help with walking and/or  transfers;A little help with bathing/dressing/bathroom;Assistance with cooking/housework;Assist for transportation;Help with stairs or ramp for entrance    Functional Status Assessment  Patient has had a recent decline in their functional status and demonstrates the ability to make significant improvements in function in a reasonable and predictable amount of time.  Equipment Recommendations  None recommended by OT    Recommendations for Other Services       Precautions / Restrictions Precautions Precautions: None Restrictions Weight Bearing Restrictions: No      Mobility Bed Mobility Overal bed mobility: Needs Assistance Bed Mobility: Supine to Sit, Sit to Supine     Supine to sit: HOB elevated, Supervision, Used rails Sit to supine: Supervision, HOB elevated   General bed mobility comments: increased effort, pain limited but no direct assist required    Transfers Overall transfer level: Needs assistance Equipment used: 1 person hand held assist Transfers: Sit to/from Stand Sit to Stand: Contact guard assist, From elevated surface           General transfer comment: increased effort/time to complete and pain limited      Balance Overall balance assessment: Needs assistance Sitting-balance support: Feet unsupported, Bilateral upper extremity supported Sitting balance-Leahy Scale: Good     Standing balance support: Bilateral upper extremity supported Standing balance-Leahy Scale: Fair Standing balance comment: handheld assist while marching in place with pt also holding onto the bed rail for additional stability. Was able to let go briefly but felt more confident with BUE support                           ADL either  performed or assessed with clinical judgement   ADL Overall ADL's : Needs assistance/impaired                     Lower Body Dressing: Sit to/from stand;Contact guard assist Lower Body Dressing Details (indicate cue type and  reason): pt able to use figure four technique for managing socks (does this at baseline)                     Vision         Perception         Praxis         Pertinent Vitals/Pain Pain Assessment Pain Assessment: 0-10 Pain Score: 6  Pain Location: lower back > rest of back Pain Descriptors / Indicators: Aching Pain Intervention(s): Monitored during session, Premedicated before session, Repositioned     Extremity/Trunk Assessment Upper Extremity Assessment Upper Extremity Assessment: Generalized weakness (decreased R shoulder ROM at basleine)   Lower Extremity Assessment Lower Extremity Assessment: Generalized weakness   Cervical / Trunk Assessment Cervical / Trunk Assessment:  (Increased R Lateral head Tilt while seated; Hx of Cervical Fx.) Cervical / Trunk Exceptions: Increased R Lateral head Tilt while seated; Hx of Cervical Fx.   Communication Communication Communication: No apparent difficulties   Cognition Arousal: Alert Behavior During Therapy: WFL for tasks assessed/performed Overall Cognitive Status: Within Functional Limits for tasks assessed                                       General Comments       Exercises Other Exercises Other Exercises: Pt educated in role of acute OT, home/routines modifications, and AE/DME for LB ADL tasks to minimize bending over which exaccerbates her back pain and SOB.   Shoulder Instructions      Home Living Family/patient expects to be discharged to:: Private residence Living Arrangements: Alone Available Help at Discharge: Family;Available PRN/intermittently (has grandson and friends that assist PRN) Type of Home: House Home Access: Ramped entrance     Home Layout: One level     Bathroom Shower/Tub: Producer, television/film/video: Standard Bathroom Accessibility: Yes   Home Equipment: Shower seat;Toilet riser;Rollator (4 wheels)          Prior Functioning/Environment Prior  Level of Function : Independent/Modified Independent             Mobility Comments: Pt reports Mod I with use of Rollator for Household/Community Ambulation. Denies fall history. ADLs Comments: IND with ADL's; Reports has intermittent assistance for grocery shopping.        OT Problem List: Decreased strength;Pain;Cardiopulmonary status limiting activity;Decreased range of motion;Decreased activity tolerance;Impaired balance (sitting and/or standing);Decreased knowledge of use of DME or AE      OT Treatment/Interventions: Self-care/ADL training;Therapeutic exercise;Therapeutic activities;Energy conservation;DME and/or AE instruction;Patient/family education;Balance training    OT Goals(Current goals can be found in the care plan section) Acute Rehab OT Goals Patient Stated Goal: go home OT Goal Formulation: With patient Time For Goal Achievement: 05/29/23 Potential to Achieve Goals: Good ADL Goals Pt Will Perform Lower Body Dressing: with modified independence;with adaptive equipment;sit to/from stand Pt Will Transfer to Toilet: with modified independence;ambulating (LRAD) Pt Will Perform Toileting - Clothing Manipulation and hygiene: with modified independence Additional ADL Goal #1: Pt will verbalize plan to implement at least 2 learned ECS into daily ADL/IADL routines to support safety and independence  upon return home.  OT Frequency: Min 1X/week    Co-evaluation              AM-PAC OT "6 Clicks" Daily Activity     Outcome Measure Help from another person eating meals?: None Help from another person taking care of personal grooming?: A Little Help from another person toileting, which includes using toliet, bedpan, or urinal?: A Little Help from another person bathing (including washing, rinsing, drying)?: A Little Help from another person to put on and taking off regular upper body clothing?: A Little Help from another person to put on and taking off regular lower body  clothing?: A Little 6 Click Score: 19   End of Session Equipment Utilized During Treatment: Gait belt;Oxygen  Activity Tolerance: Patient limited by pain Patient left: in bed;with call bell/phone within reach  OT Visit Diagnosis: Other abnormalities of gait and mobility (R26.89);Muscle weakness (generalized) (M62.81);Pain Pain - Right/Left:  (back)                Time: 1610-9604 OT Time Calculation (min): 35 min Charges:  OT General Charges $OT Visit: 1 Visit OT Evaluation $OT Eval Moderate Complexity: 1 Mod OT Treatments $Self Care/Home Management : 8-22 mins  Arman Filter., MPH, MS, OTR/L ascom [phone] 05/15/23, 2:46 PM

## 2023-05-15 NOTE — ED Notes (Signed)
Helped to get up to [facility] voided dark yellow urine. Tol well. States hurting in lower back rates 7.O2 at 2L.

## 2023-05-15 NOTE — Care Management CC44 (Signed)
Condition Code 44 Documentation Completed  Patient Details  Name: TENICA MCHARGUE MRN: [id number] Date of Birth: 29-Nov-1950   Condition Code 44 given:  Yes Patient signature on Condition Code 44 notice:  Yes Documentation of 2 MD's agreement:  Yes Code 44 added to claim:  Yes    Marquita Palms, LCSW 05/15/2023, 4:32 PM

## 2023-05-15 NOTE — ED Notes (Signed)
Sat up in bed to eat dinner.

## 2023-05-15 NOTE — Care Management Important Message (Signed)
Important Message  Patient Details  Name: Terri Burns MRN: [id number] Date of Birth: 07/27/1950   Important Message Given:        Marquita Palms, LCSW 05/15/2023, 4:31 PM

## 2023-05-15 NOTE — Care Management Obs Status (Signed)
MEDICARE OBSERVATION STATUS NOTIFICATION   Patient Details  Name: Terri Burns MRN: [id number] Date of Birth: 24-Jul-1950   Medicare Observation Status Notification Given:  Yes    Marquita Palms, LCSW 05/15/2023, 4:32 PM

## 2023-05-15 NOTE — ED Notes (Signed)
Called 2A to give handoff report. Nurse read patient chart and is ok to transport patient up to bed.

## 2023-05-16 DIAGNOSIS — R0602 Shortness of breath: Secondary | ICD-10-CM | POA: Diagnosis not present

## 2023-05-16 LAB — CBC
HCT: 26.4 % — ABNORMAL LOW (ref 36.0–46.0)
Hemoglobin: 8.4 g/dL — ABNORMAL LOW (ref 12.0–15.0)
MCH: 25 pg — ABNORMAL LOW (ref 26.0–34.0)
MCHC: 31.8 g/dL (ref 30.0–36.0)
MCV: 78.6 fL — ABNORMAL LOW (ref 80.0–100.0)
Platelets: 316 10*3/uL (ref 150–400)
RBC: 3.36 MIL/uL — ABNORMAL LOW (ref 3.87–5.11)
RDW: 17.2 % — ABNORMAL HIGH (ref 11.5–15.5)
WBC: 11.9 10*3/uL — ABNORMAL HIGH (ref 4.0–10.5)
nRBC: 0 % (ref 0.0–0.2)

## 2023-05-16 LAB — BASIC METABOLIC PANEL
Anion gap: 7 (ref 5–15)
BUN: 17 mg/dL (ref 8–23)
CO2: 24 mmol/L (ref 22–32)
Calcium: 8.2 mg/dL — ABNORMAL LOW (ref 8.9–10.3)
Chloride: 107 mmol/L (ref 98–111)
Creatinine, Ser: 0.64 mg/dL (ref 0.44–1.00)
GFR, Estimated: >60 mL/min (ref >60)
Glucose, Bld: 135 mg/dL — ABNORMAL HIGH (ref 70–99)
Potassium: 3.5 mmol/L (ref 3.5–5.1)
Sodium: 138 mmol/L (ref 135–145)

## 2023-05-16 MED ORDER — ORAL CARE MOUTH RINSE: 15.0000 mL | OROMUCOSAL | Status: DC | PRN

## 2023-05-16 NOTE — Plan of Care (Signed)

## 2023-05-16 NOTE — TOC Initial Note (Addendum)
Transition of Care [facility]) - Initial/Assessment Note    Patient Details  Name: Terri Burns MRN: [id number] Date of Birth: 08-29-1950  [facility] CM/SW Contact:    Truddie Hidden, RN Phone Number: 05/16/2023, 10:56 AM  Clinical Narrative:                 Spoke with patient regarding discharge plan and recommendation for [facility] from therapy.Patient is agreeable to [facility] PT/OT and does not have a choice of an agency. Patient advised the accepting agency will contact her directly to scheduled SOC within 48 post discharge.  Referral sent and accepted by Adelina Mings from [facility].          Patient Goals and CMS Choice            Expected Discharge Plan and Services                                              Prior Living Arrangements/Services                       Activities of Daily Living      Permission Sought/Granted                  Emotional Assessment              Admission diagnosis:  SOB (shortness of breath) [R06.02] COPD exacerbation (HCC) [J44.1] Community acquired pneumonia of right upper lobe of lung [J18.9] Patient Active Problem List   Diagnosis Date Noted   Anemia 05/14/2023   Pneumonia 05/14/2023   SOB (shortness of breath) 05/14/2023   Asthma exacerbation 01/05/2022   Anxiety and depression 01/05/2022   Dyslipidemia 01/05/2022   Coronary artery disease 01/05/2022   GERD without esophagitis 01/05/2022   Acute hypoxemic respiratory failure (HCC) 01/05/2022   Centrilobular emphysema (HCC) 07/13/2021   Cigarette smoker 04/13/2021   History of non-ST elevation myocardial infarction (NSTEMI)    HLD (hyperlipidemia) 09/13/2020   Atherosclerosis of aorta (HCC) 03/04/2018   Coronary artery calcification of native artery 03/04/2018   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 [place] [address] Phone: [phone] Fax: [phone]  [facility] - [place], [place] - [address] [place] [address] Phone: [phone] Fax: [phone]     Social Determinants of Health (SDOH) Social History: SDOH Screenings   Food Insecurity: No Food Insecurity (07/13/2021)  Housing: Low Risk  (07/13/2021)  Transportation Needs: Unmet Transportation Needs (07/13/2021)  Alcohol Screen: Low Risk  (07/13/2021)  Depression (PHQ2-9): Low Risk  (05/11/2022)  Financial Resource Strain: Low Risk  (07/13/2021)  Physical Activity: Insufficiently Active (07/13/2021)  Social Connections: Moderately Integrated (07/13/2021)  Stress: No Stress Concern Present (07/13/2021)  Tobacco Use: High Risk (05/14/2023)   SDOH Interventions:     Readmission Risk Interventions  No data to display

## 2023-05-16 NOTE — Progress Notes (Signed)
PROGRESS NOTE Terri Burns  ZOX:[id number] DOB: [birth year] DOA: 05/14/2023 PCP: Louis Matte, MD  Brief Narrative/Hospital Course:  13 yof  w/.COPD, asthma, depression, hypertension, dyslipidemia, CAD s/p CABG and stent, G1DD w/ normal LVEF 10/2021, and GERD coming for worsening shortness of breath x 1 week, cough  and got worse  12/8 early am and did not improve with nebulizer , trelegy so presented to the ED. In the ED afebrile, labs with stable CBC CMP Pro-Cal less than 0.1 BNP 156, troponin 5>4. CXR>.Patchy airspace opacity over the right upper lobe concerning for pneumonia" ABG pH 7.3 pCO2 43 CT angio chest no PE, diffuse coronary artery disease, bronchiectasis with mucoid impaction and peribronchovascular scarring new nodular area in the right upper lobe need follow-up in 3 to 20-month. Patient was admitted on IV antibiotics Solu-Medrol.   Subjective: Patient seen and examined this morning  Still complains of shortness of breath and wheezing.   Needing 3 to nasal cannula oxygen normally on room air at home  Assessment and Plan: Principal Problem:   SOB (shortness of breath) Active Problems:   Pneumonia   Tobacco abuse   Emphysema/COPD (HCC)   GERD (gastroesophageal reflux disease)   Benign essential HTN   Anemia   Acute COPD exacerbation  Bronchiectasis/mucoid impaction Pneumonia Acute hypoxic respiratory failure: Cont ceftriaxone, azithro, Solu-Medrol as she continues to have wheezing. Strep pna ag+  Cont  budesonide, Brovana and Yupelri nebulizer, flutter valve 2-4 times per day for mucus clearance, incentive spirometry.  Ambulate OOB  CAD s/p CABG hx HTN HLD: No chest pain, troponin negative.Pta ON Aspirin, Lipitor, Toprol, Brilinta Imdur losartan, amlodipine. HOLD antihypertensives  Tobacco abuse:cont nicotine patch, cessation counseling  GERD: On PPI  Microcytic anemia-hb stable in 8gms. previous baseline and 10 g range since 2023  DVT prophylaxis:  SCDs Start: 05/14/23 2139 Code Status:   Code Status: Full Code Family Communication: plan of care discussed with patient at bedside. Patient status is: Remains hospitalized because of Ciprodex elevation Level of care: Progressive   Dispo: The patient is from: home            Anticipated disposition: home ~1-2 days Objective: Vitals last 24 hrs: Vitals:   05/16/23 0447 05/16/23 0750 05/16/23 0828 05/16/23 1211  BP: (!) 144/74  (!) 148/85 (!) 144/83  Pulse: 79 87 73 81  Resp: 18 15 17 17   Temp: 98.2 F (36.8 C)  98.5 F (36.9 C) 99 F (37.2 C)  TempSrc: Oral   Oral  SpO2: 100% 99% 100% 100%  Weight:      Height:       Weight change:   Physical Examination: General exam: alert awake, oriented at baseline, older than stated age HEENT:Oral mucosa moist, Ear/Nose WNL grossly Respiratory system: Bilaterally wheezing on expiration,no use of accessory muscle Cardiovascular system: S1 & S2 +, No JVD. Gastrointestinal system: Abdomen soft,NT,ND, BS+ Nervous System: Alert, awake, moving all extremities,and following commands. Extremities: LE edema neg,distal peripheral pulses palpable and warm.  Skin: No rashes,no icterus. MSK: Normal muscle bulk,tone, power   Medications reviewed:  Scheduled Meds:  arformoterol  15 mcg Nebulization BID   aspirin EC  81 mg Oral Daily   atorvastatin  80 mg Oral Daily   budesonide (PULMICORT) nebulizer solution  0.25 mg Nebulization BID   citalopram  10 mg Oral Daily   methylPREDNISolone (SOLU-MEDROL) injection  40 mg Intravenous Q12H   revefenacin  175 mcg Nebulization Daily   ticagrelor  90 mg Oral BID  Continuous Infusions:  azithromycin 500 mg (05/15/23 2050)   cefTRIAXone (ROCEPHIN)  IV 2 g (05/15/23 1955)      Diet Order             Diet Heart Room service appropriate? Yes; Fluid consistency: Thin  Diet effective now                            Intake/Output Summary (Last 24 hours) at 05/16/2023 1240 Last data filed at  05/16/2023 0823 Gross per 24 hour  Intake 591.17 ml  Output 1401 ml  Net -809.83 ml   Net IO Since Admission: -418.33 mL [05/16/23 1240]  Wt Readings from Last 3 Encounters:  05/14/23 53.5 kg  05/11/22 60.3 kg  02/28/22 59.9 kg     Unresulted Labs (From admission, onward)     Start     Ordered   05/16/23 0500  Basic metabolic panel  Daily,   R      05/15/23 1308   05/16/23 0500  CBC  Daily,   R      05/15/23 1308          Data Reviewed: I have personally reviewed following labs and imaging studies CBC: Recent Labs  Lab 05/14/23 1903 05/15/23 0500 05/16/23 0358  WBC 10.5 6.9 11.9*  NEUTROABS 6.9  --   --   HGB 8.6* 8.0* 8.4*  HCT 27.2* 25.4* 26.4*  MCV 79.8* 79.9* 78.6*  PLT 318 290 316   Basic Metabolic Panel:  Recent Labs  Lab 05/14/23 1903 05/15/23 0406 05/16/23 0358  NA 139 137 138  K 4.0 3.6 3.5  CL 110 108 107  CO2 19* 24 24  GLUCOSE 96 154* 135*  BUN 11 9 17   CREATININE 0.71 0.66 0.64  CALCIUM 8.2* 8.1* 8.2*   GFR: Estimated Creatinine Clearance: 47.6 mL/min (by C-G formula based on SCr of 0.64 mg/dL). Liver Function Tests:  Recent Labs  Lab 05/14/23 1903 05/15/23 0406  AST 33 21  ALT 29 26  ALKPHOS 68 65  BILITOT 1.0 0.5  PROT 7.1 6.2*  ALBUMIN 3.3* 3.0*  Sepsis Labs: Recent Labs  Lab 05/14/23 1903 05/14/23 2259 05/15/23 0406  PROCALCITON <0.10  --   --   LATICACIDVEN  --  0.8 0.9   Recent Results (from the past 240 hour(s))  SARS Coronavirus 2 by RT PCR (hospital order, performed in [facility] hospital lab) *cepheid single result test* Anterior Nasal Swab     Status: None   Collection Time: 05/14/23  7:50 PM   Specimen: Anterior Nasal Swab  Result Value Ref Range Status   SARS Coronavirus 2 by RT PCR NEGATIVE NEGATIVE Final    Comment: (NOTE) SARS-CoV-2 target nucleic acids are NOT DETECTED.  The SARS-CoV-2 RNA is generally detectable in upper and lower respiratory specimens during the acute phase of infection. The  lowest concentration of SARS-CoV-2 viral copies this assay can detect is 250 copies / mL. A negative result does not preclude SARS-CoV-2 infection and should not be used as the sole basis for treatment or other patient management decisions.  A negative result may occur with improper specimen collection / handling, submission of specimen other than nasopharyngeal swab, presence of viral mutation(s) within the areas targeted by this assay, and inadequate number of viral copies (<250 copies / mL). A negative result must be combined with clinical observations, patient history, and epidemiological information.  Fact Sheet for Patients:   RoadLapTop.co.za  Fact Sheet for Healthcare Providers: http://kim-Weese.com/  This test is not yet approved or  cleared by the [place] FDA and has been authorized for detection and/or diagnosis of SARS-CoV-2 by FDA under an Emergency Use Authorization (EUA).  This EUA will remain in effect (meaning this test can be used) for the duration of the COVID-19 declaration under Section 564(b)(1) of the Act, 21 U.S.C. section 360bbb-3(b)(1), unless the authorization is terminated or revoked sooner.  Performed at [facility], [address] [place], [place] [address]     Antimicrobials/Microbiology: Anti-infectives (From admission, onward)    Start     Dose/Rate Route Frequency Ordered Stop   05/14/23 2015  cefTRIAXone (ROCEPHIN) 2 g in sodium chloride 0.9 % 100 mL IVPB        2 g 200 mL/hr over 30 Minutes Intravenous Every 24 hours 05/14/23 2014 05/19/23 2014   05/14/23 2015  azithromycin (ZITHROMAX) 500 mg in sodium chloride 0.9 % 250 mL IVPB        500 mg 250 mL/hr over 60 Minutes Intravenous Every 24 hours 05/14/23 2014 05/19/23 2014      No results found for: "SDES", "SPECREQUEST", "CULT", "REPTSTATUS"   Radiology Studies: CT Angio Chest Pulmonary Embolism (PE) W or WO  Contrast  Result Date: 05/15/2023 CLINICAL DATA:  Respiratory distress.  COPD exacerbation. EXAM: CT ANGIOGRAPHY CHEST WITH CONTRAST TECHNIQUE: Multidetector CT imaging of the chest was performed using the standard protocol during bolus administration of intravenous contrast. Multiplanar CT image reconstructions and MIPs were obtained to evaluate the vascular anatomy. RADIATION DOSE REDUCTION: This exam was performed according to the departmental dose-optimization program which includes automated exposure control, adjustment of the mA and/or kV according to patient size and/or use of iterative reconstruction technique. CONTRAST:  75mL OMNIPAQUE IOHEXOL 350 MG/ML SOLN COMPARISON:  02/28/2022 FINDINGS: Cardiovascular: Heart is normal size. Extensive coronary artery and aortic calcifications. No evidence of aortic aneurysm. No filling defects in the pulmonary arteries to suggest pulmonary emboli. Mediastinum/Nodes: No mediastinal, hilar, or axillary adenopathy. Trachea and esophagus are unremarkable. Thyroid unremarkable. Lungs/Pleura: Centrilobular emphysema. Nodular area and surrounding ground-glass in the right upper lobe is new since prior study, measuring up to 1.8 cm. Mucoid impaction noted in the lower lobe airways. Mild bronchiectasis in the lower lobes. Peribronchovascular nodularity in the mid and lower lungs compatible with scarring, stable since prior study. No effusions. Upper Abdomen: No acute findings Musculoskeletal: Chest wall soft tissues are unremarkable. No acute bony abnormality. Review of the MIP images confirms the above findings. IMPRESSION: No evidence of pulmonary embolus. Diffuse coronary artery disease. Bronchiectasis with mucoid impaction and peribronchovascular scarring, stable. New nodular area in the right upper lobe, favor infectious/inflammatory. This could be followed with repeat CT in 3-6 months. Aortic Atherosclerosis (ICD10-I70.0) and Emphysema (ICD10-J43.9). Electronically  Signed   By: Charlett Nose M.D.   On: 05/15/2023 00:25   DG Chest Portable 1 View  Result Date: 05/14/2023 CLINICAL DATA:  Shortness of breath, cough EXAM: PORTABLE CHEST 1 VIEW COMPARISON:  01/05/2022 FINDINGS: Heart and mediastinal contours within normal limits. Aortic atherosclerosis. Patchy opacity projects over the right upper lobe. Nodular densities project over the left lower lung compatible with bronchiectasis and scarring seen on prior CT. No effusions. No acute bony abnormality. IMPRESSION: Patchy airspace opacity over the right upper lobe concerning for pneumonia. Followup PA and lateral chest X-ray is recommended in 3-4 weeks following trial of antibiotic therapy to ensure resolution and exclude underlying malignancy. Electronically Signed   By:  Charlett Nose M.D.   On: 05/14/2023 19:37     LOS: 1 day   Total time spent in review of labs and imaging, patient evaluation, formulation of plan, documentation and communication with family: 35  minutes  Lanae Boast, MD [facility]  05/16/2023, 12:40 PM

## 2023-05-16 NOTE — Plan of Care (Signed)
  Problem: Clinical Measurements: Goal: Will remain free from infection Outcome: Progressing Goal: Respiratory complications will improve Outcome: Not Progressing Goal: Cardiovascular complication will be avoided Outcome: Progressing

## 2023-05-16 NOTE — Progress Notes (Signed)
Physical Therapy Treatment Patient Details Name: Terri Burns MRN: [id number] DOB: Jul 04, 1950 Today's Date: 05/16/2023   History of Present Illness Terri Burns is a 72 y.o. female with medical history significant for COPD, asthma, depression, hypertension, dyslipidemia, CAD status post PCI and stent, and GERD coming for SOB.  Shortness of started last night at 3 AM and has progressively gotten worse with no relief of baseline neb treatments and inhalers at home    PT Comments  Patient supine in bed upon arrival, pleasantly agreeable to PT tx session. Patient with improved tolerance for activities today. Patient able to complete bed mobility Mod I (increased time required). Able to stand from EOB and ambulate to recliner, short distance in room without use of AD and supervision. No overt LOB noted. Mild SOB. Sp02 staying stable with mid to high 90's on RA, RN notified. PT provided education on pursed lip breathing to assist with SOB with activity. Pt will continue to benefit from skilled acute PT services to address impairments and maximize functional mobility. Will continue to follow acutely.    If plan is discharge home, recommend the following: A little help with walking and/or transfers;A little help with bathing/dressing/bathroom;Assist for transportation;Help with stairs or ramp for entrance   Can travel by private vehicle        Equipment Recommendations  None recommended by PT    Recommendations for Other Services       Precautions / Restrictions Precautions Precautions: None Restrictions Weight Bearing Restrictions: No     Mobility  Bed Mobility Overal bed mobility: Modified Independent Bed Mobility: Supine to Sit     Supine to sit: Modified independent (Device/Increase time)     General bed mobility comments: Pt able to complete supine > sit (HOB slightly elevated) with Mod I, increased time required. No use of rails.    Transfers Overall transfer level: Needs  assistance Equipment used: None Transfers: Sit to/from Stand Sit to Stand: Supervision           General transfer comment: pt able to stand from EOB with supervision, no AD, no LOB or unsteadiness noted with completion. Pt on 4L supplemental oxygen, removed and patient staying mid to high 90's on RA.    Ambulation/Gait Ambulation/Gait assistance: Supervision   Assistive device: None Gait Pattern/deviations: Step-through pattern Gait velocity: Decreased     General Gait Details: Pt able to ambulate short distance in room from bed > recliner with no AD, supervision for safety. Pt fatigues quickly, reports mild SOB, vitals stable.   Stairs             Wheelchair Mobility     Tilt Bed    Modified Rankin (Stroke Patients Only)       Balance Overall balance assessment: Needs assistance Sitting-balance support: Feet supported, No upper extremity supported Sitting balance-Leahy Scale: Normal     Standing balance support: No upper extremity supported, During functional activity Standing balance-Leahy Scale: Good Standing balance comment: no AD use with mobility, no overt LOB                            Cognition Arousal: Lethargic Behavior During Therapy: WFL for tasks assessed/performed Overall Cognitive Status: Within Functional Limits for tasks assessed  Exercises Other Exercises Other Exercises: Educated on pursed lip breathing for SOB; energy conservation techniques, purpose of AD for increasing ambulation distance.    General Comments        Pertinent Vitals/Pain Pain Assessment Pain Assessment: No/denies pain    Home Living                          Prior Function            PT Goals (current goals can now be found in the care plan section) Acute Rehab PT Goals PT Goal Formulation: With patient Time For Goal Achievement: 05/29/23 Potential to Achieve Goals:  Good Progress towards PT goals: Progressing toward goals    Frequency    Min 1X/week      PT Plan      Co-evaluation              AM-PAC PT "6 Clicks" Mobility   Outcome Measure  Help needed turning from your back to your side while in a flat bed without using bedrails?: None Help needed moving from lying on your back to sitting on the side of a flat bed without using bedrails?: None Help needed moving to and from a bed to a chair (including a wheelchair)?: A Little Help needed standing up from a chair using your arms (e.g., wheelchair or bedside chair)?: A Little Help needed to walk in hospital room?: A Little Help needed climbing 3-5 steps with a railing? : A Lot 6 Click Score: 19    End of Session Equipment Utilized During Treatment: Oxygen;Gait belt Activity Tolerance: Patient limited by fatigue Patient left: in chair;with chair alarm set;with call bell/phone within reach Nurse Communication: Mobility status;Other (comment) (Sp02 stable on RA) PT Visit Diagnosis: Muscle weakness (generalized) (M62.81);Unsteadiness on feet (R26.81)     Time: 8295-6213 PT Time Calculation (min) (ACUTE ONLY): 23 min  Charges:    $Gait Training: 8-22 mins $Therapeutic Activity: 8-22 mins PT General Charges $$ ACUTE PT VISIT: 1 Visit                     Howie Ill, PT, DPT 05/16/23 2:41 PM

## 2023-05-16 NOTE — Progress Notes (Deleted)
Pt declined Metaneb at this time due to surgery today and sleepy from anesthesia

## 2023-05-17 ENCOUNTER — Encounter: Payer: Self-pay | Admitting: Internal Medicine

## 2023-05-17 DIAGNOSIS — J4489 Other specified chronic obstructive pulmonary disease: Secondary | ICD-10-CM | POA: Diagnosis not present

## 2023-05-17 DIAGNOSIS — R0602 Shortness of breath: Secondary | ICD-10-CM | POA: Diagnosis present

## 2023-05-17 DIAGNOSIS — Z833 Family history of diabetes mellitus: Secondary | ICD-10-CM | POA: Diagnosis not present

## 2023-05-17 DIAGNOSIS — Z85828 Personal history of other malignant neoplasm of skin: Secondary | ICD-10-CM | POA: Diagnosis not present

## 2023-05-17 DIAGNOSIS — Z823 Family history of stroke: Secondary | ICD-10-CM | POA: Diagnosis not present

## 2023-05-17 DIAGNOSIS — F17210 Nicotine dependence, cigarettes, uncomplicated: Secondary | ICD-10-CM

## 2023-05-17 DIAGNOSIS — I10 Essential (primary) hypertension: Secondary | ICD-10-CM | POA: Diagnosis present

## 2023-05-17 DIAGNOSIS — Z888 Allergy status to other drugs, medicaments and biological substances status: Secondary | ICD-10-CM | POA: Diagnosis not present

## 2023-05-17 DIAGNOSIS — E785 Hyperlipidemia, unspecified: Secondary | ICD-10-CM | POA: Diagnosis present

## 2023-05-17 DIAGNOSIS — J471 Bronchiectasis with (acute) exacerbation: Secondary | ICD-10-CM | POA: Diagnosis present

## 2023-05-17 DIAGNOSIS — K219 Gastro-esophageal reflux disease without esophagitis: Secondary | ICD-10-CM

## 2023-05-17 DIAGNOSIS — Z886 Allergy status to analgesic agent status: Secondary | ICD-10-CM | POA: Diagnosis not present

## 2023-05-17 DIAGNOSIS — Z881 Allergy status to other antibiotic agents status: Secondary | ICD-10-CM | POA: Diagnosis not present

## 2023-05-17 DIAGNOSIS — Z951 Presence of aortocoronary bypass graft: Secondary | ICD-10-CM | POA: Diagnosis not present

## 2023-05-17 DIAGNOSIS — J9601 Acute respiratory failure with hypoxia: Secondary | ICD-10-CM | POA: Diagnosis present

## 2023-05-17 DIAGNOSIS — J13 Pneumonia due to Streptococcus pneumoniae: Secondary | ICD-10-CM

## 2023-05-17 DIAGNOSIS — Z955 Presence of coronary angioplasty implant and graft: Secondary | ICD-10-CM | POA: Diagnosis not present

## 2023-05-17 DIAGNOSIS — D509 Iron deficiency anemia, unspecified: Secondary | ICD-10-CM | POA: Diagnosis present

## 2023-05-17 DIAGNOSIS — Z8249 Family history of ischemic heart disease and other diseases of the circulatory system: Secondary | ICD-10-CM | POA: Diagnosis not present

## 2023-05-17 DIAGNOSIS — Z880 Allergy status to penicillin: Secondary | ICD-10-CM | POA: Diagnosis not present

## 2023-05-17 DIAGNOSIS — Z1152 Encounter for screening for COVID-19: Secondary | ICD-10-CM | POA: Diagnosis not present

## 2023-05-17 DIAGNOSIS — J439 Emphysema, unspecified: Secondary | ICD-10-CM | POA: Diagnosis present

## 2023-05-17 DIAGNOSIS — J45902 Unspecified asthma with status asthmaticus: Secondary | ICD-10-CM

## 2023-05-17 DIAGNOSIS — J440 Chronic obstructive pulmonary disease with acute lower respiratory infection: Secondary | ICD-10-CM | POA: Diagnosis present

## 2023-05-17 DIAGNOSIS — J441 Chronic obstructive pulmonary disease with (acute) exacerbation: Secondary | ICD-10-CM | POA: Diagnosis present

## 2023-05-17 DIAGNOSIS — F32A Depression, unspecified: Secondary | ICD-10-CM | POA: Diagnosis present

## 2023-05-17 DIAGNOSIS — I2510 Atherosclerotic heart disease of native coronary artery without angina pectoris: Secondary | ICD-10-CM | POA: Diagnosis present

## 2023-05-17 LAB — CBC
HCT: 28 % — ABNORMAL LOW (ref 36.0–46.0)
Hemoglobin: 8.8 g/dL — ABNORMAL LOW (ref 12.0–15.0)
MCH: 25.1 pg — ABNORMAL LOW (ref 26.0–34.0)
MCHC: 31.4 g/dL (ref 30.0–36.0)
MCV: 79.8 fL — ABNORMAL LOW (ref 80.0–100.0)
Platelets: 345 10*3/uL (ref 150–400)
RBC: 3.51 MIL/uL — ABNORMAL LOW (ref 3.87–5.11)
RDW: 17.2 % — ABNORMAL HIGH (ref 11.5–15.5)
WBC: 10.2 10*3/uL (ref 4.0–10.5)
nRBC: 0 % (ref 0.0–0.2)

## 2023-05-17 LAB — BASIC METABOLIC PANEL
Anion gap: 7 (ref 5–15)
BUN: 21 mg/dL (ref 8–23)
CO2: 24 mmol/L (ref 22–32)
Calcium: 8.2 mg/dL — ABNORMAL LOW (ref 8.9–10.3)
Chloride: 105 mmol/L (ref 98–111)
Creatinine, Ser: 0.78 mg/dL (ref 0.44–1.00)
GFR, Estimated: >60 mL/min (ref >60)
Glucose, Bld: 121 mg/dL — ABNORMAL HIGH (ref 70–99)
Potassium: 4.4 mmol/L (ref 3.5–5.1)
Sodium: 136 mmol/L (ref 135–145)

## 2023-05-17 MED ORDER — PANTOPRAZOLE SODIUM 40 MG PO TBEC
40.0000 mg | DELAYED_RELEASE_TABLET | Freq: Every day | ORAL | Status: DC
  Administered 2023-05-18 – 2023-05-20 (×3): 40 mg via ORAL
  Filled 2023-05-17 (×3): qty 1

## 2023-05-17 MED ORDER — SODIUM CHLORIDE 0.9 % IV SOLN
2.0000 g | Freq: Two times a day (BID) | INTRAVENOUS | Status: DC
  Administered 2023-05-18 – 2023-05-20 (×5): 2 g via INTRAVENOUS
  Filled 2023-05-17 (×7): qty 12.5

## 2023-05-17 NOTE — Plan of Care (Signed)

## 2023-05-17 NOTE — Assessment & Plan Note (Signed)
Continuing home regimen of daily PPI therapy.  

## 2023-05-17 NOTE — Assessment & Plan Note (Signed)
See assessment and plan above

## 2023-05-17 NOTE — Assessment & Plan Note (Signed)
Evidence of bronchiectasis with mucoid plugging on CT imaging. If acute exacerbation of bronchiectasis is contributing then pseudomonal coverage should be initiated, switching intravenous ceftriaxone to cefepime as patient is allergic to levofloxacin Remainder of assessment and plan as above.

## 2023-05-17 NOTE — Assessment & Plan Note (Signed)
Counseling daily on cessation Patient unwilling to quit, does not want nicotine patch

## 2023-05-17 NOTE — Progress Notes (Signed)
PROGRESS NOTE   Terri Burns  BMW:[id number] DOB: Jan 07, 1951 DOA: 05/14/2023 PCP: Louis Matte, MD   Date of Service: the patient was seen and examined on 05/17/2023  Brief Narrative:   68 yof  w/.COPD, asthma, depression, hypertension, dyslipidemia, CAD s/p CABG and stent, G1DD w/ normal LVEF 10/2021, and GERD presenting to [facility] emergency department with complaints of shortness of breath for 1 week with associated cough.    Upon evaluation in the emergency department patient felt to be suffering from COPD and asthma exacerbation.  The hospitalist group was then called to assess the patient for admission to the hospital.  CT angiogram of the chest revealed no evidence of pulmonary embolism but did reveal a possible new right upper lobe pneumonia with evidence of bronchiectasis.  Antibiotics were adjusted from ceftriaxone to cefepime to include pseudomonal coverage, azithromycin was continued.  Patient was additionally treated with aggressive bronchodilator therapy and systemic steroids.    Assessment & Plan 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) Patient presenting with significant shortness of breath wheezing and productive cough Symptoms minimally improved since admission Continue aggressive bronchodilator therapy. Continue systemic steroids Adjusting antibiotics by switching ceftriaxone to cefepime for pseudomonal coverage considering bronchiectasis with concurrent right upper lobe pneumonia with strep pneumonia antigen positivity. Continuing azithromycin Acute exacerbation of bronchiectasis (HCC) Evidence of bronchiectasis with mucoid plugging on CT imaging. If acute exacerbation of bronchiectasis is contributing then pseudomonal coverage should be initiated, switching intravenous ceftriaxone to cefepime as patient is allergic to levofloxacin Remainder of assessment and plan as above. Pneumonia of right upper lobe due to Streptococcus pneumoniae [facility]) See assessment  and plan above GERD (gastroesophageal reflux disease) Continuing home regimen of daily PPI therapy.  Benign essential HTN Blood pressures trending upwards As needed intravenous antihypertensives for markedly elevated blood pressures Nicotine dependence, cigarettes, uncomplicated Counseling daily on cessation Patient unwilling to quit, does not want nicotine patch     Subjective:  Patient complaining of continued shortness of breath, severe in intensity, associated with wheezing and cough occasionally productive with white sputum.  Patient denies any associated chest pain.  Patient does state that she feels slightly improved compared to when she came in the hospital.  Physical Exam:  Vitals:   05/17/23 1109 05/17/23 1520 05/17/23 1950 05/17/23 2129  BP: 121/86 (!) 148/84  (!) 159/94  Pulse: 86 78  70  Resp: 19 19  18   Temp: 99 F (37.2 C) 98.5 F (36.9 C)  98.4 F (36.9 C)  TempSrc:  Oral    SpO2: 98% 97% 97% 100%  Weight:      Height:         Constitutional: Awake alert and oriented x3, patient is complaining of mild respiratory distress. Skin: no rashes, no lesions, poor skin turgor noted. Eyes: Pupils are equally reactive to light.  No evidence of scleral icterus or conjunctival pallor.  ENMT: Moist mucous membranes noted.  Posterior pharynx clear of any exudate or lesions.   Respiratory: Intermittent wheezing heard in the bilateral middle and lower fields.  No significant rales noted.  Patient is tachypneic without accessory muscle use Cardiovascular: Regular rate and rhythm, no murmurs / rubs / gallops. No extremity edema. 2+ pedal pulses. No carotid bruits.  Abdomen: Abdomen is soft and nontender.  No evidence of intra-abdominal masses.  Positive bowel sounds noted in all quadrants.   Musculoskeletal: No joint deformity upper and lower extremities. Good ROM, no contractures. Normal muscle tone.    Data Reviewed:  I  have personally reviewed and interpreted labs,  imaging.  Significant findings are   CBC: Recent Labs  Lab 05/14/23 1903 05/15/23 0500 05/16/23 0358 05/17/23 0421  WBC 10.5 6.9 11.9* 10.2  NEUTROABS 6.9  --   --   --   HGB 8.6* 8.0* 8.4* 8.8*  HCT 27.2* 25.4* 26.4* 28.0*  MCV 79.8* 79.9* 78.6* 79.8*  PLT 318 290 316 345   Basic Metabolic Panel: Recent Labs  Lab 05/14/23 1903 05/15/23 0406 05/16/23 0358 05/17/23 0421  NA 139 137 138 136  K 4.0 3.6 3.5 4.4  CL 110 108 107 105  CO2 19* 24 24 24   GLUCOSE 96 154* 135* 121*  BUN 11 9 17 21   CREATININE 0.71 0.66 0.64 0.78  CALCIUM 8.2* 8.1* 8.2* 8.2*   GFR: Estimated Creatinine Clearance: 47.6 mL/min (by C-G formula based on SCr of 0.78 mg/dL). Liver Function Tests: Recent Labs  Lab 05/14/23 1903 05/15/23 0406  AST 33 21  ALT 29 26  ALKPHOS 68 65  BILITOT 1.0 0.5  PROT 7.1 6.2*  ALBUMIN 3.3* 3.0*     Code Status:  Full code.  Code status decision has been confirmed with: patient    Severity of Illness:  The appropriate patient status for this patient is INPATIENT. Inpatient status is judged to be reasonable and necessary in order to provide the required intensity of service to ensure the patient's safety. The patient's presenting symptoms, physical exam findings, and initial radiographic and laboratory data in the context of their chronic comorbidities is felt to place them at high risk for further clinical deterioration. Furthermore, it is not anticipated that the patient will be medically stable for discharge from the hospital within 2 midnights of admission.   * I certify that at the point of admission it is my clinical judgment that the patient will require inpatient hospital care spanning beyond 2 midnights from the point of admission due to high intensity of service, high risk for further deterioration and high frequency of surveillance required.*  Time spent:  54 minutes  Author:  Marinda Elk MD  05/17/2023 10:50 PM

## 2023-05-17 NOTE — Progress Notes (Signed)
Pharmacy Antibiotic Note  Terri Burns is a 72 y.o. female admitted on 05/14/2023 with acute flare of bronchectasis, allergic to flouroquinolones.  Pharmacy has been consulted for Cefepime dosing.  Plan: Cefepime 2 gm q12hr per indication & renal fxn.  Pharmacy will continue to follow and will adjust abx dosing whenever warranted.  Temp (24hrs), Avg:98.4 F (36.9 C), Min:97.9 F (36.6 C), Max:99 F (37.2 C)   Recent Labs  Lab 05/14/23 1903 05/14/23 2259 05/15/23 0406 05/15/23 0500 05/16/23 0358 05/17/23 0421  WBC 10.5  --   --  6.9 11.9* 10.2  CREATININE 0.71  --  0.66  --  0.64 0.78  LATICACIDVEN  --  0.8 0.9  --   --   --     Estimated Creatinine Clearance: 47.6 mL/min (by C-G formula based on SCr of 0.78 mg/dL).    Allergies  Allergen Reactions   Levofloxacin Shortness Of Breath   Penicillins Hives    itching   Meloxicam     dizziness   Nsaids Other (See Comments)    Patient states she can tolerate up to three doses per day without incident    Antimicrobials this admission: 12/10 Ceftriaxone >> x 2 doses 12/11 Azithromycin >> x 1 dose 12/12 Cefepime >>   Microbiology results: No lab cx currently ordered or pending at this time.  Thank you for allowing pharmacy to be a part of this patient's care.  Otelia Sergeant, PharmD, [facility] 05/17/2023 10:44 PM

## 2023-05-17 NOTE — Assessment & Plan Note (Signed)
Blood pressures trending upwards As needed intravenous antihypertensives for markedly elevated blood pressures

## 2023-05-17 NOTE — Assessment & Plan Note (Signed)
Patient presenting with significant shortness of breath wheezing and productive cough Symptoms minimally improved since admission Continue aggressive bronchodilator therapy. Continue systemic steroids Adjusting antibiotics by switching ceftriaxone to cefepime for pseudomonal coverage considering bronchiectasis with concurrent right upper lobe pneumonia with strep pneumonia antigen positivity. Continuing azithromycin

## 2023-05-18 DIAGNOSIS — J13 Pneumonia due to Streptococcus pneumoniae: Secondary | ICD-10-CM | POA: Diagnosis not present

## 2023-05-18 DIAGNOSIS — I2510 Atherosclerotic heart disease of native coronary artery without angina pectoris: Secondary | ICD-10-CM

## 2023-05-18 DIAGNOSIS — J4489 Other specified chronic obstructive pulmonary disease: Secondary | ICD-10-CM | POA: Diagnosis not present

## 2023-05-18 DIAGNOSIS — I10 Essential (primary) hypertension: Secondary | ICD-10-CM | POA: Diagnosis not present

## 2023-05-18 DIAGNOSIS — J471 Bronchiectasis with (acute) exacerbation: Secondary | ICD-10-CM | POA: Diagnosis not present

## 2023-05-18 LAB — COMPREHENSIVE METABOLIC PANEL
ALT: 30 U/L (ref 0–44)
AST: 22 U/L (ref 15–41)
Albumin: 2.9 g/dL — ABNORMAL LOW (ref 3.5–5.0)
Alkaline Phosphatase: 59 U/L (ref 38–126)
Anion gap: 5 (ref 5–15)
BUN: 24 mg/dL — ABNORMAL HIGH (ref 8–23)
CO2: 28 mmol/L (ref 22–32)
Calcium: 8.2 mg/dL — ABNORMAL LOW (ref 8.9–10.3)
Chloride: 105 mmol/L (ref 98–111)
Creatinine, Ser: 0.72 mg/dL (ref 0.44–1.00)
GFR, Estimated: >60 mL/min (ref >60)
Glucose, Bld: 120 mg/dL — ABNORMAL HIGH (ref 70–99)
Potassium: 4.5 mmol/L (ref 3.5–5.1)
Sodium: 138 mmol/L (ref 135–145)
Total Bilirubin: 0.4 mg/dL (ref <1.2)
Total Protein: 6 g/dL — ABNORMAL LOW (ref 6.5–8.1)

## 2023-05-18 LAB — CBC WITH DIFFERENTIAL/PLATELET
Abs Immature Granulocytes: 0.19 10*3/uL — ABNORMAL HIGH (ref 0.00–0.07)
Basophils Absolute: 0 10*3/uL (ref 0.0–0.1)
Basophils Relative: 0 %
Eosinophils Absolute: 0 10*3/uL (ref 0.0–0.5)
Eosinophils Relative: 0 %
HCT: 27.2 % — ABNORMAL LOW (ref 36.0–46.0)
Hemoglobin: 8.7 g/dL — ABNORMAL LOW (ref 12.0–15.0)
Immature Granulocytes: 2 %
Lymphocytes Relative: 9 %
Lymphs Abs: 0.9 10*3/uL (ref 0.7–4.0)
MCH: 24.7 pg — ABNORMAL LOW (ref 26.0–34.0)
MCHC: 32 g/dL (ref 30.0–36.0)
MCV: 77.3 fL — ABNORMAL LOW (ref 80.0–100.0)
Monocytes Absolute: 0.6 10*3/uL (ref 0.1–1.0)
Monocytes Relative: 6 %
Neutro Abs: 8.7 10*3/uL — ABNORMAL HIGH (ref 1.7–7.7)
Neutrophils Relative %: 83 %
Platelets: 350 10*3/uL (ref 150–400)
RBC: 3.52 MIL/uL — ABNORMAL LOW (ref 3.87–5.11)
RDW: 17.3 % — ABNORMAL HIGH (ref 11.5–15.5)
WBC: 10.4 10*3/uL (ref 4.0–10.5)
nRBC: 0 % (ref 0.0–0.2)

## 2023-05-18 LAB — IRON AND TIBC
Iron: 22 ug/dL — ABNORMAL LOW (ref 28–170)
Saturation Ratios: 6 % — ABNORMAL LOW (ref 10.4–31.8)
TIBC: 358 ug/dL (ref 250–450)
UIBC: 336 ug/dL

## 2023-05-18 LAB — FOLATE: Folate: 8.3 ng/mL (ref >5.9)

## 2023-05-18 LAB — RETICULOCYTES
Immature Retic Fract: 23.7 % — ABNORMAL HIGH (ref 2.3–15.9)
RBC.: 3.52 MIL/uL — ABNORMAL LOW (ref 3.87–5.11)
Retic Count, Absolute: 64.4 10*3/uL (ref 19.0–186.0)
Retic Ct Pct: 1.8 % (ref 0.4–3.1)

## 2023-05-18 LAB — FERRITIN: Ferritin: 12 ng/mL (ref 11–307)

## 2023-05-18 LAB — MAGNESIUM: Magnesium: 2.1 mg/dL (ref 1.7–2.4)

## 2023-05-18 LAB — VITAMIN B12: Vitamin B-12: 206 pg/mL (ref 180–914)

## 2023-05-18 MED ORDER — ISOSORBIDE MONONITRATE ER 30 MG PO TB24
15.0000 mg | ORAL_TABLET | Freq: Every day | ORAL | Status: DC
  Administered 2023-05-18 – 2023-05-20 (×3): 15 mg via ORAL
  Filled 2023-05-18 (×3): qty 1

## 2023-05-18 MED ORDER — METOPROLOL SUCCINATE ER 25 MG PO TB24
25.0000 mg | ORAL_TABLET | Freq: Every day | ORAL | Status: DC
  Administered 2023-05-18 – 2023-05-20 (×3): 25 mg via ORAL
  Filled 2023-05-18 (×3): qty 1

## 2023-05-18 MED ORDER — PREDNISONE 50 MG PO TABS
50.0000 mg | ORAL_TABLET | Freq: Every day | ORAL | Status: DC
  Administered 2023-05-18 – 2023-05-20 (×3): 50 mg via ORAL
  Filled 2023-05-18 (×3): qty 1

## 2023-05-18 MED ORDER — DOXYCYCLINE HYCLATE 100 MG PO TABS: 100.0000 mg | ORAL_TABLET | Freq: Two times a day (BID) | ORAL | Status: DC

## 2023-05-18 MED ORDER — ALBUTEROL SULFATE (2.5 MG/3ML) 0.083% IN NEBU: 2.5000 mg | INHALATION_SOLUTION | Freq: Four times a day (QID) | RESPIRATORY_TRACT | Status: DC

## 2023-05-18 MED ORDER — LOSARTAN POTASSIUM 25 MG PO TABS
25.0000 mg | ORAL_TABLET | Freq: Every day | ORAL | Status: DC
  Administered 2023-05-18 – 2023-05-20 (×3): 25 mg via ORAL
  Filled 2023-05-18 (×3): qty 1

## 2023-05-18 MED ORDER — IPRATROPIUM-ALBUTEROL 0.5-2.5 (3) MG/3ML IN SOLN
3.0000 mL | Freq: Four times a day (QID) | RESPIRATORY_TRACT | Status: DC
  Administered 2023-05-18 – 2023-05-20 (×6): 3 mL via RESPIRATORY_TRACT
  Filled 2023-05-18 (×6): qty 3

## 2023-05-18 NOTE — Progress Notes (Signed)
Physical Therapy Treatment Patient Details Name: Terri Burns MRN: [id number] DOB: 12-Aug-1950 Today's Date: 05/18/2023   History of Present Illness Terri Burns is a 72 y.o. female with medical history significant for COPD, asthma, depression, hypertension, dyslipidemia, CAD status post PCI and stent, and GERD coming for SOB.  Shortness of started last night at 3 AM and has progressively gotten worse with no relief of baseline neb treatments and inhalers at home    PT Comments  Pt received in bed on 2L O2 SpO2 99%. Pt states she became very SOB last night while using BSC and could not catch her breath. Pt placed on RA for session, 96% at rest, dropping to 90% after ambulating only a short distance +DOE requiring 2L O2 and cues for PLB and relaxation techniques to return to 99%. Pt states she experienced productive thick yellow sputum this morning. Also due to chronic spine pain and structural changes/curvatures, pt's pain causes significant guarding to prevent increased pain with movement resulting in decreased normal breathing pattern and lung expansion. Pt educated on benefits of pain control, use of accapella for pulmonary toileting, and PLB/relaxation techniques for energy conservation. Pt appears close to functional baseline despite low activity tolerance due to dyspnea.      If plan is discharge home, recommend the following: A little help with walking and/or transfers;A little help with bathing/dressing/bathroom;Assist for transportation;Help with stairs or ramp for entrance   Can travel by private vehicle        Equipment Recommendations  None recommended by PT;Other (comment) (Pt has a rollator at home)    Recommendations for Other Services       Precautions / Restrictions Precautions Precautions: None Restrictions Weight Bearing Restrictions Per Provider Order: No     Mobility  Bed Mobility Overal bed mobility: Modified Independent Bed Mobility: Supine to Sit      Supine to sit: Modified independent (Device/Increase time) Sit to supine: Contact guard assist   General bed mobility comments: Pt able to complete supine > sit (HOB slightly elevated) with Mod I, increased time required. No use of rails.    Transfers Overall transfer level: Needs assistance Equipment used: None Transfers: Sit to/from Stand Sit to Stand: Supervision           General transfer comment: pt able to stand from EOB with supervision, no AD, no LOB or unsteadiness noted with completion.    Ambulation/Gait Ambulation/Gait assistance: Contact guard assist Gait Distance (Feet): 15 Feet Assistive device: 1 person hand held assist Gait Pattern/deviations: Step-through pattern Gait velocity: Decreased     General Gait Details: Pt able to ambulate short distance in room from bed > BSC, 15'x2 with no AD, HHA for safety. Pt fatigues quickly, + SOB/DOE, SpO2 90% upon exertion.   Stairs             Wheelchair Mobility     Tilt Bed    Modified Rankin (Stroke Patients Only)       Balance Overall balance assessment: Needs assistance Sitting-balance support: Feet supported, No upper extremity supported Sitting balance-Leahy Scale: Normal Sitting balance - Comments:  (No LOB reaching out of BOS)   Standing balance support: During functional activity, Single extremity supported Standing balance-Leahy Scale: Fair Standing balance comment: Quick to fatique and DOE causing increased anxiety                            Cognition Arousal: Alert Behavior During Therapy: [facility]  for tasks assessed/performed Overall Cognitive Status: Within Functional Limits for tasks assessed                                 General Comments: Pleasant, able to answer questions regarding PLOF and home situation without difficulty        Exercises Other Exercises Other Exercises: Educated on PT role, breathing techniques, and importance of OOB  mobility. Other Exercises: Educated on pursed lip breathing for SOB; energy conservation techniques, purpose of AD for increasing ambulation distance. Other Exercises: Demonstrated proper technique using acapella x 5    General Comments General comments (skin integrity, edema, etc.): Pt educated on role of PT. Discussed home set up and PLOF at length.      Pertinent Vitals/Pain Pain Assessment Pain Assessment: 0-10 Pain Score: 8  Pain Location: neck, back, hips Pain Descriptors / Indicators: Aching, Discomfort, Grimacing, Guarding Pain Intervention(s): Patient requesting pain meds-RN notified    Home Living                          Prior Function            PT Goals (current goals can now be found in the care plan section) Acute Rehab PT Goals Patient Stated Goal: Get back home Progress towards PT goals: Progressing toward goals    Frequency    Min 1X/week      PT Plan      Co-evaluation              AM-PAC PT "6 Clicks" Mobility   Outcome Measure  Help needed turning from your back to your side while in a flat bed without using bedrails?: None Help needed moving from lying on your back to sitting on the side of a flat bed without using bedrails?: None Help needed moving to and from a bed to a chair (including a wheelchair)?: A Little Help needed standing up from a chair using your arms (e.g., wheelchair or bedside chair)?: A Little Help needed to walk in hospital room?: A Little Help needed climbing 3-5 steps with a railing? : A Lot 6 Click Score: 19    End of Session Equipment Utilized During Treatment: Oxygen;Gait belt Activity Tolerance: Patient limited by fatigue Patient left: in chair;with chair alarm set;with call bell/phone within reach Nurse Communication: Mobility status;Other (comment) (SpO2) PT Visit Diagnosis: Muscle weakness (generalized) (M62.81);Unsteadiness on feet (R26.81)     Time: 4132-4401 PT Time Calculation (min)  (ACUTE ONLY): 39 min  Charges:    $Gait Training: 8-22 mins $Therapeutic Exercise: 8-22 mins $Therapeutic Activity: 8-22 mins PT General Charges $$ ACUTE PT VISIT: 1 Visit                    Zadie Cleverly, PTA  Jannet Askew 05/18/2023, 1:07 PM

## 2023-05-18 NOTE — Assessment & Plan Note (Signed)
See assessment and plan above

## 2023-05-18 NOTE — Progress Notes (Signed)
PROGRESS NOTE   Terri Burns  ZOX:[id number] DOB: 23-Feb-1951 DOA: 05/14/2023 PCP: Louis Matte, MD   Date of Service: the patient was seen and examined on 05/18/2023  Brief Narrative:   28 yof  w/.COPD, asthma, depression, hypertension, dyslipidemia, CAD s/p CABG and stent, G1DD w/ normal LVEF 10/2021, and GERD presenting to [facility] emergency department with complaints of shortness of breath for 1 week with associated cough.    Upon evaluation in the emergency department patient felt to be suffering from COPD and asthma exacerbation.  The hospitalist group was then called to assess the patient for admission to the hospital.  CT angiogram of the chest revealed no evidence of pulmonary embolism but did reveal a possible new right upper lobe pneumonia with evidence of bronchiectasis.  Antibiotics were adjusted from ceftriaxone to cefepime to include pseudomonal coverage, azithromycin was continued.  Patient was additionally treated with aggressive bronchodilator therapy and systemic steroids.    Assessment & Plan 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) Patient presenting with significant shortness of breath wheezing and productive cough multifactorial secondary to COPD/asthma exacerbation with acute bronchiectasis and pneumonia of the right upper lobe. Symptoms continue to persist since admission. Increased intensity of bronchodilator therapy, switching from albuterol to DuoNebs and discontinuing Yupelri Continue systemic steroids Adjusting antibiotics by switching ceftriaxone to cefepime for pseudomonal coverage considering bronchiectasis with concurrent right upper lobe pneumonia with  strep pneumonia antigen positivity. Azithromycin discontinued Acute exacerbation of bronchiectasis (HCC) Evidence of bronchiectasis with mucoid plugging on CT imaging. Treating with intravenous cefepime for pseudomonal coverage patient is allergic to levofloxacin Remainder of assessment and  plan as above. Pneumonia of right upper lobe due to Streptococcus pneumoniae [facility]) See assessment and plan above Coronary artery disease involving native coronary artery of native heart without angina pectoris Patient is currently chest pain free Monitoring patient on telemetry Continue home regimen of Brilinta, aspirin Continue home regimen of atorvastatin Continue home regimen of metoprolol Resuming lower dose of losartan  Resuming Imdur  GERD (gastroesophageal reflux disease) Continuing home regimen of daily PPI therapy.  Benign essential HTN Blood pressures trending upwards Resuming metoprolol, losartan As needed intravenous antihypertensives for markedly elevated blood pressures Nicotine dependence, cigarettes, uncomplicated Counseling daily on cessation Patient unwilling to quit, does not want nicotine patch     Subjective:  Patient can sinew to complain of shortness of breath.  Patient also continues to complain of associated cough.  Patient denies chest pain.  Patient complains of associated generalized fatigue and "feeling anxious."    Physical Exam:  Vitals:   05/17/23 1950 05/17/23 2129 05/17/23 2356 05/18/23 0329  BP:  (!) 159/94 (!) 152/91 (!) 162/95  Pulse:  70 72 77  Resp:  18 18 16   Temp:  98.4 F (36.9 C) 98.5 F (36.9 C) 97.6 F (36.4 C)  TempSrc:      SpO2: 97% 100% 100% 100%  Weight:      Height:         Constitutional: Awake alert and oriented x3, not currently in respiratory distress. Skin: no rashes, no lesions, poor skin turgor noted. Eyes: Pupils are equally reactive to light.  No evidence of scleral icterus or conjunctival pallor.  ENMT: Moist mucous membranes noted.  Posterior pharynx clear of any exudate or lesions.   Respiratory: Somewhat diminished air movement heard in all fields with intermittent expiratory wheezing heard with prolonged expiratory phase.  No significant rales noted.  Patient is tachypneic without accessory muscle  use Cardiovascular: Regular  rate and rhythm, no murmurs / rubs / gallops. No extremity edema. 2+ pedal pulses. No carotid bruits.  Abdomen: Abdomen is soft and nontender.  No evidence of intra-abdominal masses.  Positive bowel sounds noted in all quadrants.   Musculoskeletal: No joint deformity upper and lower extremities. Good ROM, no contractures. Normal muscle tone.    Data Reviewed:  I have personally reviewed and interpreted labs, imaging.  Significant findings are   CBC: Recent Labs  Lab 05/14/23 1903 05/15/23 0500 05/16/23 0358 05/17/23 0421 05/18/23 0457  WBC 10.5 6.9 11.9* 10.2 10.4  NEUTROABS 6.9  --   --   --  8.7*  HGB 8.6* 8.0* 8.4* 8.8* 8.7*  HCT 27.2* 25.4* 26.4* 28.0* 27.2*  MCV 79.8* 79.9* 78.6* 79.8* 77.3*  PLT 318 290 316 345 350   Basic Metabolic Panel: Recent Labs  Lab 05/14/23 1903 05/15/23 0406 05/16/23 0358 05/17/23 0421 05/18/23 0457  NA 139 137 138 136 138  K 4.0 3.6 3.5 4.4 4.5  CL 110 108 107 105 105  CO2 19* 24 24 24 28   GLUCOSE 96 154* 135* 121* 120*  BUN 11 9 17 21  24*  CREATININE 0.71 0.66 0.64 0.78 0.72  CALCIUM 8.2* 8.1* 8.2* 8.2* 8.2*  MG  --   --   --   --  2.1   GFR: Estimated Creatinine Clearance: 47.6 mL/min (by C-G formula based on SCr of 0.72 mg/dL). Liver Function Tests: Recent Labs  Lab 05/14/23 1903 05/15/23 0406 05/18/23 0457  AST 33 21 22  ALT 29 26 30   ALKPHOS 68 65 59  BILITOT 1.0 0.5 0.4  PROT 7.1 6.2* 6.0*  ALBUMIN 3.3* 3.0* 2.9*     Code Status:  Full code.  Code status decision has been confirmed with: patient    Severity of Illness:  The appropriate patient status for this patient is INPATIENT. Inpatient status is judged to be reasonable and necessary in order to provide the required intensity of service to ensure the patient's safety. The patient's presenting symptoms, physical exam findings, and initial radiographic and laboratory data in the context of their chronic comorbidities is felt to  place them at high risk for further clinical deterioration. Furthermore, it is not anticipated that the patient will be medically stable for discharge from the hospital within 2 midnights of admission.   * I certify that at the point of admission it is my clinical judgment that the patient will require inpatient hospital care spanning beyond 2 midnights from the point of admission due to high intensity of service, high risk for further deterioration and high frequency of surveillance required.*  Time spent:  50 minutes  Author:  Marinda Elk MD  05/18/2023 9:42 AM

## 2023-05-18 NOTE — Assessment & Plan Note (Addendum)
Evidence of bronchiectasis with mucoid plugging on CT imaging. Treating with intravenous cefepime for pseudomonal coverage patient is allergic to levofloxacin Remainder of assessment and plan as above.

## 2023-05-18 NOTE — Assessment & Plan Note (Signed)
Counseling daily on cessation Patient unwilling to quit, does not want nicotine patch

## 2023-05-18 NOTE — Assessment & Plan Note (Addendum)
Blood pressures trending upwards Resuming metoprolol, losartan As needed intravenous antihypertensives for markedly elevated blood pressures

## 2023-05-18 NOTE — Assessment & Plan Note (Signed)
Patient is currently chest pain free Monitoring patient on telemetry Continue home regimen of Brilinta, aspirin Continue home regimen of atorvastatin Continue home regimen of metoprolol Resuming lower dose of losartan  Resuming Imdur

## 2023-05-18 NOTE — Plan of Care (Signed)
  Problem: Clinical Measurements: Goal: Respiratory complications will improve Outcome: Not Progressing  SOB with activity

## 2023-05-18 NOTE — Progress Notes (Signed)
Occupational Therapy Treatment Patient Details Name: Terri Burns MRN: [id number] DOB: 18-Jul-1950 Today's Date: 05/18/2023   History of present illness Terri Burns is a 72 y.o. female with medical history significant for COPD, asthma, depression, hypertension, dyslipidemia, CAD status post PCI and stent, and GERD coming for SOB.  Shortness of started last night at 3 AM and has progressively gotten worse with no relief of baseline neb treatments and inhalers at home   OT comments  Pt seen for OT tx. Pt pleasant, reporting just returned to bed after being up in the recliner and then toileting with nursing. Pt denies SOB at the moment but reports being a bit hot. Pt removed blanket and felt better. SpO2 99% on 2L at rest, HR in 80's. Pt notes improved pain control today. OT facilitated problem solving to help incorporate learned ECS including activty pacing and work simplification strategies into daily ADL/IADL/mobility routines to improve safety/independence. Pt was educated in AE to improve SOB with LB dressing and bathing as well as when attempting to complete tasks slightly out of reach or involving bending which causes more pain and SOB. OT recommended pt consider obtaining reacher, LH shoe horn, LH sponge, and educated in use of suction shower head holder to make the handheld shower head more accessible. Due to her height she reports unable to reach it when it's hooked in place. Pt able verbalize use of "pacing myself" to support dressing task to minimize SOB. Pt continues to benefit from skilled OT services.       If plan is discharge home, recommend the following:  A little help with walking and/or transfers;A little help with bathing/dressing/bathroom;Assistance with cooking/housework;Assist for transportation;Help with stairs or ramp for entrance   Equipment Recommendations  Other (comment) (reacher, LH sponge, LH shoe horn, suction handheld showerhead holder)    Recommendations for  Other Services      Precautions / Restrictions Precautions Precautions: Fall Restrictions Weight Bearing Restrictions Per Provider Order: No       Mobility Bed Mobility               General bed mobility comments: declined 2/2 just back to bed    Transfers                         Balance                                           ADL either performed or assessed with clinical judgement   ADL Overall ADL's : Needs assistance/impaired                           Toilet Transfer Details (indicate cue type and reason): pt reported just using BSC wiht NT prior to OT session, denies need for significant assist for the transfer Toileting- Clothing Manipulation and Hygiene: Modified independent;Sitting/lateral lean              Extremity/Trunk Assessment              Vision       Perception     Praxis      Cognition Arousal: Alert Behavior During Therapy: WFL for tasks assessed/performed Overall Cognitive Status: Within Functional Limits for tasks assessed  Exercises Other Exercises Other Exercises: OT facilitated problem solving to help incorporate learned ECS including activty pacing and work simplification strategies into daily ADL/IADL/mobility routines to improve safety/independence.    Shoulder Instructions       General Comments Pt educated on role of PT. Discussed home set up and PLOF at length.    Pertinent Vitals/ Pain       Pain Assessment Pain Assessment: 0-10 Pain Score: 6  Pain Location: neck, back, hips Pain Descriptors / Indicators: Aching, Discomfort, Grimacing, Guarding Pain Intervention(s): Limited activity within patient's tolerance, Monitored during session, Premedicated before session, Repositioned  Home Living                                          Prior Functioning/Environment              Frequency   Min 1X/week        Progress Toward Goals  OT Goals(current goals can now be found in the care plan section)  Progress towards OT goals: Progressing toward goals  Acute Rehab OT Goals Patient Stated Goal: go home OT Goal Formulation: With patient Time For Goal Achievement: 05/29/23 Potential to Achieve Goals: Good  Plan      Co-evaluation                 AM-PAC OT "6 Clicks" Daily Activity     Outcome Measure   Help from another person eating meals?: None Help from another person taking care of personal grooming?: A Little Help from another person toileting, which includes using toliet, bedpan, or urinal?: A Little Help from another person bathing (including washing, rinsing, drying)?: A Little Help from another person to put on and taking off regular upper body clothing?: A Little Help from another person to put on and taking off regular lower body clothing?: A Little 6 Click Score: 19    End of Session Equipment Utilized During Treatment: Oxygen  OT Visit Diagnosis: Other abnormalities of gait and mobility (R26.89);Muscle weakness (generalized) (M62.81);Pain Pain - Right/Left:  (neck, back, hips)   Activity Tolerance Patient tolerated treatment well   Patient Left in bed;with call bell/phone within reach;with bed alarm set   Nurse Communication          Time: 0981-1914 OT Time Calculation (min): 23 min  Charges: OT General Charges $OT Visit: 1 Visit OT Treatments $Self Care/Home Management : 8-22 mins  Arman Filter., MPH, MS, OTR/L ascom [phone] 05/18/23, 2:40 PM

## 2023-05-18 NOTE — TOC Progression Note (Signed)
Transition of Care [facility]) - Progression Note    Patient Details  Name: Terri Burns MRN: [id number] Date of Birth: May 06, 1951  [facility] CM/SW Contact  Truddie Hidden, RN Phone Number: 05/18/2023, 11:02 AM  Clinical Narrative:    TOC continuing to follow patient's progress throughout discharge planning.        Expected Discharge Plan and Services                                               Social Determinants of Health (SDOH) Interventions SDOH Screenings   Food Insecurity: No Food Insecurity (05/17/2023)  Housing: Patient Declined (05/17/2023)  Transportation Needs: Patient Declined (05/17/2023)  Utilities: Patient Declined (05/17/2023)  Alcohol Screen: Low Risk  (07/13/2021)  Depression (PHQ2-9): Low Risk  (05/11/2022)  Financial Resource Strain: Low Risk  (07/13/2021)  Physical Activity: Insufficiently Active (07/13/2021)  Social Connections: Moderately Integrated (07/13/2021)  Stress: No Stress Concern Present (07/13/2021)  Tobacco Use: High Risk (05/17/2023)    Readmission Risk Interventions     No data to display

## 2023-05-18 NOTE — Assessment & Plan Note (Addendum)
Patient presenting with significant shortness of breath wheezing and productive cough multifactorial secondary to COPD/asthma exacerbation with acute bronchiectasis and pneumonia of the right upper lobe. Symptoms continue to persist since admission. Increased intensity of bronchodilator therapy, switching from albuterol to DuoNebs and discontinuing Yupelri Continue systemic steroids Adjusting antibiotics by switching ceftriaxone to cefepime for pseudomonal coverage considering bronchiectasis with concurrent right upper lobe pneumonia with  strep pneumonia antigen positivity. Azithromycin discontinued

## 2023-05-18 NOTE — Assessment & Plan Note (Deleted)
 PRN albuterol nebs

## 2023-05-18 NOTE — Assessment & Plan Note (Signed)
Continuing home regimen of daily PPI therapy.  

## 2023-05-18 NOTE — Plan of Care (Signed)

## 2023-05-19 ENCOUNTER — Inpatient Hospital Stay: Payer: Medicare HMO

## 2023-05-19 DIAGNOSIS — I10 Essential (primary) hypertension: Secondary | ICD-10-CM | POA: Diagnosis not present

## 2023-05-19 DIAGNOSIS — D509 Iron deficiency anemia, unspecified: Secondary | ICD-10-CM

## 2023-05-19 DIAGNOSIS — J471 Bronchiectasis with (acute) exacerbation: Secondary | ICD-10-CM | POA: Diagnosis not present

## 2023-05-19 DIAGNOSIS — J13 Pneumonia due to Streptococcus pneumoniae: Secondary | ICD-10-CM | POA: Diagnosis not present

## 2023-05-19 DIAGNOSIS — J4489 Other specified chronic obstructive pulmonary disease: Secondary | ICD-10-CM | POA: Diagnosis not present

## 2023-05-19 LAB — COMPREHENSIVE METABOLIC PANEL
ALT: 41 U/L (ref 0–44)
AST: 30 U/L (ref 15–41)
Albumin: 2.9 g/dL — ABNORMAL LOW (ref 3.5–5.0)
Alkaline Phosphatase: 60 U/L (ref 38–126)
Anion gap: 6 (ref 5–15)
BUN: 23 mg/dL (ref 8–23)
CO2: 23 mmol/L (ref 22–32)
Calcium: 8.2 mg/dL — ABNORMAL LOW (ref 8.9–10.3)
Chloride: 104 mmol/L (ref 98–111)
Creatinine, Ser: 0.87 mg/dL (ref 0.44–1.00)
GFR, Estimated: >60 mL/min (ref >60)
Glucose, Bld: 83 mg/dL (ref 70–99)
Potassium: 4.2 mmol/L (ref 3.5–5.1)
Sodium: 133 mmol/L — ABNORMAL LOW (ref 135–145)
Total Bilirubin: 0.4 mg/dL (ref <1.2)
Total Protein: 5.9 g/dL — ABNORMAL LOW (ref 6.5–8.1)

## 2023-05-19 LAB — CBC WITH DIFFERENTIAL/PLATELET
Abs Immature Granulocytes: 0.27 10*3/uL — ABNORMAL HIGH (ref 0.00–0.07)
Basophils Absolute: 0 10*3/uL (ref 0.0–0.1)
Basophils Relative: 0 %
Eosinophils Absolute: 0 10*3/uL (ref 0.0–0.5)
Eosinophils Relative: 0 %
HCT: 27.8 % — ABNORMAL LOW (ref 36.0–46.0)
Hemoglobin: 8.6 g/dL — ABNORMAL LOW (ref 12.0–15.0)
Immature Granulocytes: 2 %
Lymphocytes Relative: 7 %
Lymphs Abs: 1 10*3/uL (ref 0.7–4.0)
MCH: 24.3 pg — ABNORMAL LOW (ref 26.0–34.0)
MCHC: 30.9 g/dL (ref 30.0–36.0)
MCV: 78.5 fL — ABNORMAL LOW (ref 80.0–100.0)
Monocytes Absolute: 1 10*3/uL (ref 0.1–1.0)
Monocytes Relative: 7 %
Neutro Abs: 12.1 10*3/uL — ABNORMAL HIGH (ref 1.7–7.7)
Neutrophils Relative %: 84 %
Platelets: 381 10*3/uL (ref 150–400)
RBC: 3.54 MIL/uL — ABNORMAL LOW (ref 3.87–5.11)
RDW: 17.2 % — ABNORMAL HIGH (ref 11.5–15.5)
WBC: 14.5 10*3/uL — ABNORMAL HIGH (ref 4.0–10.5)
nRBC: 0 % (ref 0.0–0.2)

## 2023-05-19 LAB — MAGNESIUM: Magnesium: 2.1 mg/dL (ref 1.7–2.4)

## 2023-05-19 LAB — PHOSPHORUS: Phosphorus: 2.8 mg/dL (ref 2.5–4.6)

## 2023-05-19 MED ORDER — VITAMIN B-12 1000 MCG PO TABS
1000.0000 ug | ORAL_TABLET | Freq: Every day | ORAL | Status: DC
  Administered 2023-05-20: 1000 ug via ORAL
  Filled 2023-05-19: qty 1

## 2023-05-19 MED ORDER — FERROUS SULFATE 325 (65 FE) MG PO TABS
325.0000 mg | ORAL_TABLET | Freq: Every day | ORAL | Status: DC
  Administered 2023-05-20: 325 mg via ORAL
  Filled 2023-05-19: qty 1

## 2023-05-19 MED ORDER — SODIUM CHLORIDE 0.9 % IV SOLN
100.0000 mg | Freq: Once | INTRAVENOUS | Status: DC
  Filled 2023-05-19: qty 5

## 2023-05-19 MED ORDER — CYANOCOBALAMIN 1000 MCG/ML IJ SOLN
1000.0000 ug | Freq: Once | INTRAMUSCULAR | Status: AC
  Administered 2023-05-19: 1000 ug via INTRAMUSCULAR
  Filled 2023-05-19: qty 1

## 2023-05-19 MED ORDER — IRON SUCROSE 200 MG IVPB - SIMPLE MED
200.0000 mg | Freq: Once | Status: AC
  Administered 2023-05-19: 200 mg via INTRAVENOUS
  Filled 2023-05-19: qty 200

## 2023-05-19 NOTE — Assessment & Plan Note (Signed)
 Counseling daily on cessation Patient unwilling to quit, does not want nicotine patch

## 2023-05-19 NOTE — Assessment & Plan Note (Signed)
See assessment and plan above

## 2023-05-19 NOTE — Plan of Care (Signed)
  Problem: Pain Management: Goal: General experience of comfort will improve Outcome: Progressing   Problem: Safety: Goal: Ability to remain free from injury will improve Outcome: Progressing   Problem: Skin Integrity: Goal: Risk for impaired skin integrity will decrease Outcome: Progressing

## 2023-05-19 NOTE — Assessment & Plan Note (Signed)
Continuing home regimen of daily PPI therapy.  

## 2023-05-19 NOTE — Assessment & Plan Note (Signed)
 Blood pressures trending upwards Resuming metoprolol, losartan As needed intravenous antihypertensives for markedly elevated blood pressures

## 2023-05-19 NOTE — Assessment & Plan Note (Signed)
Significant microcytic anemia on serial CBCs No clinical evidence of bleeding Workup revealing low vitamin B12 level and iron levels Initiating vitamin B12 injection followed by oral vitamin B12 in addition to intravenous Venofer followed by oral ferrous sulfate.

## 2023-05-19 NOTE — Plan of Care (Signed)
  Problem: Health Behavior/Discharge Planning: Goal: Ability to manage health-related needs will improve Outcome: Progressing   Problem: Clinical Measurements: Goal: Will remain free from infection Outcome: Progressing Goal: Respiratory complications will improve Outcome: Progressing   Problem: Activity: Goal: Risk for activity intolerance will decrease Outcome: Progressing

## 2023-05-19 NOTE — Assessment & Plan Note (Signed)
 Evidence of bronchiectasis with mucoid plugging on CT imaging. Treating with intravenous cefepime for pseudomonal coverage patient is allergic to levofloxacin Remainder of assessment and plan as above.

## 2023-05-19 NOTE — Care Management Important Message (Signed)
Important Message  Patient Details  Name: [name] MRN: [id number] Date of Birth: [birth year]   Important Message Given:  Yes - Medicare IM     [name] 05/19/2023, 2:04 PM

## 2023-05-19 NOTE — Progress Notes (Signed)
PROGRESS NOTE   Terri Burns  ZOX:[id number] DOB: 01/09/1951 DOA: 05/14/2023 PCP: Louis Matte, MD   Date of Service: the patient was seen and examined on 05/19/2023  Brief Narrative:   48 yof  w/.COPD, asthma, depression, hypertension, dyslipidemia, CAD s/p CABG and stent, G1DD w/ normal LVEF 10/2021, and GERD presenting to [facility] emergency department with complaints of shortness of breath for 1 week with associated cough.    Upon evaluation in the emergency department patient felt to be suffering from COPD and asthma exacerbation.  The hospitalist group was then called to assess the patient for admission to the hospital.  CT angiogram of the chest revealed no evidence of pulmonary embolism but did reveal a possible new right upper lobe pneumonia with evidence of bronchiectasis.  Antibiotics were adjusted from ceftriaxone to cefepime to include pseudomonal coverage.   Patient was additionally treated with aggressive bronchodilator therapy and systemic steroids.  Strep pneumonia antigen was found to be positive and therefore azithromycin was discontinued.   Assessment & Plan 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) Exacerbation thought to be caused by pneumonia of the right upper lobe with strep pneumoniae. Symptoms continue to persist since admission. Continue systemic steroids Intravenous antibiotics with cefepime for pseudomonal coverage considering bronchiectasis with concurrent right upper lobe pneumonia with  strep pneumonia antigen positivity. Azithromycin discontinued Acute exacerbation of bronchiectasis (HCC) Evidence of bronchiectasis with mucoid plugging on CT imaging. Treating with intravenous cefepime for pseudomonal coverage patient is allergic to levofloxacin Remainder of assessment and plan as above. Pneumonia of right upper lobe due to Streptococcus pneumoniae [facility]) See assessment and plan above Coronary artery disease involving native coronary artery of  native heart without angina pectoris Patient is currently chest pain free Monitoring patient on telemetry Continue home regimen of Brilinta, aspirin Continue home regimen of atorvastatin Continue home regimen of metoprolol Resuming lower dose of losartan  Resuming Imdur  GERD (gastroesophageal reflux disease) Continuing home regimen of daily PPI therapy.  Benign essential HTN Blood pressures trending upwards Resuming metoprolol, losartan As needed intravenous antihypertensives for markedly elevated blood pressures Nicotine dependence, cigarettes, uncomplicated Counseling daily on cessation Patient unwilling to quit, does not want nicotine patch Microcytic anemia Significant microcytic anemia on serial CBCs No clinical evidence of bleeding Workup revealing low vitamin B12 level and iron levels Initiating vitamin B12 injection followed by oral vitamin B12 in addition to intravenous Venofer followed by oral ferrous sulfate.     Subjective:  Patient continues to complain of shortness of breath.  Shortness breath is moderate intensity, somewhat improved since yesterday.  Shortness breath is worse with exertion and improved with rest  Physical Exam:  Vitals:   05/18/23 1933 05/18/23 2354 05/19/23 0400 05/19/23 0741  BP:  120/78 124/70   Pulse:  68 71   Resp:  17 19   Temp:  98 F (36.7 C) 97.6 F (36.4 C)   TempSrc:  Oral Oral   SpO2: 98% 100% 100% 100%  Weight:      Height:         Constitutional: Awake alert and oriented x3, not currently in respiratory distress. Skin: no rashes, no lesions, poor skin turgor noted. Eyes: Pupils are equally reactive to light.  No evidence of scleral icterus or conjunctival pallor.  ENMT: Moist mucous membranes noted.  Posterior pharynx clear of any exudate or lesions.   Respiratory: Patient continuing to exhibit diminished breath sounds in all fields with expiratory wheezing and prolonged expiratory phase.  No significant rales noted.   Patient is tachypneic without accessory muscle use Cardiovascular: Regular rate and rhythm, no murmurs / rubs / gallops. No extremity edema. 2+ pedal pulses. No carotid bruits.  Abdomen: Abdomen is soft and nontender.  No evidence of intra-abdominal masses.  Positive bowel sounds noted in all quadrants.   Musculoskeletal: No joint deformity upper and lower extremities. Good ROM, no contractures. Normal muscle tone.    Data Reviewed:  I have personally reviewed and interpreted labs, imaging.  Significant findings are   CBC: Recent Labs  Lab 05/14/23 1903 05/15/23 0500 05/16/23 0358 05/17/23 0421 05/18/23 0457  WBC 10.5 6.9 11.9* 10.2 10.4  NEUTROABS 6.9  --   --   --  8.7*  HGB 8.6* 8.0* 8.4* 8.8* 8.7*  HCT 27.2* 25.4* 26.4* 28.0* 27.2*  MCV 79.8* 79.9* 78.6* 79.8* 77.3*  PLT 318 290 316 345 350   Basic Metabolic Panel: Recent Labs  Lab 05/14/23 1903 05/15/23 0406 05/16/23 0358 05/17/23 0421 05/18/23 0457  NA 139 137 138 136 138  K 4.0 3.6 3.5 4.4 4.5  CL 110 108 107 105 105  CO2 19* 24 24 24 28   GLUCOSE 96 154* 135* 121* 120*  BUN 11 9 17 21  24*  CREATININE 0.71 0.66 0.64 0.78 0.72  CALCIUM 8.2* 8.1* 8.2* 8.2* 8.2*  MG  --   --   --   --  2.1   GFR: Estimated Creatinine Clearance: 47.6 mL/min (by C-G formula based on SCr of 0.72 mg/dL). Liver Function Tests: Recent Labs  Lab 05/14/23 1903 05/15/23 0406 05/18/23 0457  AST 33 21 22  ALT 29 26 30   ALKPHOS 68 65 59  BILITOT 1.0 0.5 0.4  PROT 7.1 6.2* 6.0*  ALBUMIN 3.3* 3.0* 2.9*     Code Status:  Full code.  Code status decision has been confirmed with: patient    Severity of Illness:  The appropriate patient status for this patient is INPATIENT. Inpatient status is judged to be reasonable and necessary in order to provide the required intensity of service to ensure the patient's safety. The patient's presenting symptoms, physical exam findings, and initial radiographic and laboratory data in the  context of their chronic comorbidities is felt to place them at high risk for further clinical deterioration. Furthermore, it is not anticipated that the patient will be medically stable for discharge from the hospital within 2 midnights of admission.   * I certify that at the point of admission it is my clinical judgment that the patient will require inpatient hospital care spanning beyond 2 midnights from the point of admission due to high intensity of service, high risk for further deterioration and high frequency of surveillance required.*  Time spent:  51 minutes  Author:  Marinda Elk MD  05/19/2023 9:36 AM

## 2023-05-19 NOTE — Progress Notes (Signed)
Per [facility], they do not have a sputum sample for this pt. Shauna Hugh, MD aware. No new orders at this time.

## 2023-05-19 NOTE — Assessment & Plan Note (Addendum)
Exacerbation thought to be caused by pneumonia of the right upper lobe with strep pneumoniae. Symptoms continue to persist since admission. Continue systemic steroids Intravenous antibiotics with cefepime for pseudomonal coverage considering bronchiectasis with concurrent right upper lobe pneumonia with  strep pneumonia antigen positivity. Azithromycin discontinued

## 2023-05-19 NOTE — Assessment & Plan Note (Signed)
 Patient is currently chest pain free Monitoring patient on telemetry Continue home regimen of Brilinta, aspirin Continue home regimen of atorvastatin Continue home regimen of metoprolol Resuming lower dose of losartan  Resuming Imdur

## 2023-05-19 NOTE — Progress Notes (Signed)
Physical Therapy Treatment Patient Details Name: Terri Burns MRN: [id number] DOB: [birth year] Today's Date: 05/19/2023   History of Present Illness Terri Burns is a 72 y.o. female with medical history significant for COPD, asthma, depression, hypertension, dyslipidemia, CAD status post PCI and stent, and GERD coming for SOB.  Shortness of started last night at 3 AM and has progressively gotten worse with no relief of baseline neb treatments and inhalers at home    PT Comments  Pt was long sitting in bed on rm air upon arrival. Sao2 at rest 95%. Pt was easily and safely able to exit bed without assistance. She stood to RW and ambulated ~ 75 ft total. Distance limited by SOB/fatigue. Sao2 desaturates to 86% on rm air however with static standing rest + breathing techniques, quickly recovers to > 92%. Overall pt is progressing well but is limited by poor activity tolerance. Did discuss need to discontinue smoking at home and need for increased daily activity to improve activity tolerance while decreasing SOB.      If plan is discharge home, recommend the following: A little help with walking and/or transfers;A little help with bathing/dressing/bathroom;Assist for transportation;Help with stairs or ramp for entrance     Equipment Recommendations  None recommended by PT;Other (comment)       Precautions / Restrictions Precautions Precautions: Fall Restrictions Weight Bearing Restrictions Per Provider Order: No     Mobility  Bed Mobility Overal bed mobility: Modified Independent  General bed mobility comments: no physical assistance required to exit bed or return to bed after OOB activity. pt does not tolerate laying flat due to SOB    Transfers Overall transfer level: Needs assistance Equipment used: Rolling walker (2 wheels) Transfers: Sit to/from Stand Sit to Stand: Supervision  General transfer comment: no physical assistance to stand or sit at EOB     Ambulation/Gait Ambulation/Gait assistance: Supervision, Contact guard assist Gait Distance (Feet): 75 Feet Assistive device: Rolling walker (2 wheels) Gait Pattern/deviations: Step-through pattern Gait velocity: Decreased  General Gait Details: pt tolerated gait ~ 75 ft however does have one episode of LOB/scissoring. sao2 on rm air dropped to 86% however with standing rest recovers to > 91% without need of O2.    Balance Overall balance assessment: Needs assistance Sitting-balance support: Feet supported, No upper extremity supported Sitting balance-Leahy Scale: Normal     Standing balance support: During functional activity, Single extremity supported Standing balance-Leahy Scale: Fair       Cognition Arousal: Alert Behavior During Therapy: WFL for tasks assessed/performed Overall Cognitive Status: Within Functional Limits for tasks assessed    General Comments: A and O x 4. Pleasant, able to answer questions regarding PLOF and home situation without difficulty               Pertinent Vitals/Pain Pain Assessment Pain Assessment: No/denies pain Pain Score: 0-No pain     PT Goals (current goals can now be found in the care plan section) Acute Rehab PT Goals Patient Stated Goal: go home Progress towards PT goals: Progressing toward goals    Frequency    Min 1X/week       AM-PAC PT "6 Clicks" Mobility   Outcome Measure  Help needed turning from your back to your side while in a flat bed without using bedrails?: None Help needed moving from lying on your back to sitting on the side of a flat bed without using bedrails?: None Help needed moving to and from a bed to a chair (  including a wheelchair)?: None Help needed standing up from a chair using your arms (e.g., wheelchair or bedside chair)?: A Little Help needed to walk in hospital room?: A Little Help needed climbing 3-5 steps with a railing? : A Little 6 Click Score: 21    End of Session   Activity  Tolerance: Patient tolerated treatment well;Patient limited by fatigue Patient left: with call bell/phone within reach;in bed Nurse Communication: Mobility status PT Visit Diagnosis: Muscle weakness (generalized) (M62.81);Unsteadiness on feet (R26.81)     Time: 8469-6295 PT Time Calculation (min) (ACUTE ONLY): 17 min  Charges:    $Gait Training: 8-22 mins PT General Charges $$ ACUTE PT VISIT: 1 Visit                    Jetta Lout PTA 05/19/23, 4:19 PM

## 2023-05-19 NOTE — Progress Notes (Signed)
Pharmacy Antibiotic Note  Terri Burns is a 72 y.o. female admitted on 05/14/2023 with acute flare of bronchectasis, allergic to flouroquinolones. PMH includes COPD, asthma, depression, hypertension, dyslipidemia, CAD s/p CABG and stent placement. Pharmacy has been consulted for Cefepime dosing.  Dr Leafy Half escalated therapy from ceftriaxone to cefepime "for pseudomonal coverage considering bronchiectasis with concurrent right upper lobe pneumonia" on 12/11.   Plan: Day #6 of Abx, Day #2 since escalation.  Continue cefepime 2 gm IV q12hr  Azithromycin completed 12/12 after 5 doses Will follow clinical progress and rena function to adjust therapy as needed.  Pharmacy will continue to follow and will adjust abx dosing whenever warranted.  Temp (24hrs), Avg:98 F (36.7 C), Min:97.6 F (36.4 C), Max:98.2 F (36.8 C)   Recent Labs  Lab 05/14/23 1903 05/14/23 2259 05/15/23 0406 05/15/23 0500 05/16/23 0358 05/17/23 0421 05/18/23 0457  WBC 10.5  --   --  6.9 11.9* 10.2 10.4  CREATININE 0.71  --  0.66  --  0.64 0.78 0.72  LATICACIDVEN  --  0.8 0.9  --   --   --   --     Estimated Creatinine Clearance: 47.6 mL/min (by C-G formula based on SCr of 0.72 mg/dL).    Allergies  Allergen Reactions   Levofloxacin Shortness Of Breath   Penicillins Hives    itching   Meloxicam     dizziness   Nsaids Other (See Comments)    Patient states she can tolerate up to three doses per day without incident    Antimicrobials this admission: 12/10 Ceftriaxone >> x 2 doses 12/11 Azithromycin >> x 1 dose 12/12 Cefepime >>   Microbiology results: 12/9:Strep pneumo urine: positive  Thank you for allowing pharmacy to be a part of this patient's care.  Kayline Sheer Rodriguez-Guzman PharmD, BCPS 05/19/2023 11:13 AM

## 2023-05-20 DIAGNOSIS — J471 Bronchiectasis with (acute) exacerbation: Secondary | ICD-10-CM | POA: Diagnosis not present

## 2023-05-20 DIAGNOSIS — J4489 Other specified chronic obstructive pulmonary disease: Secondary | ICD-10-CM | POA: Diagnosis not present

## 2023-05-20 DIAGNOSIS — D509 Iron deficiency anemia, unspecified: Secondary | ICD-10-CM

## 2023-05-20 DIAGNOSIS — R7989 Other specified abnormal findings of blood chemistry: Secondary | ICD-10-CM | POA: Diagnosis present

## 2023-05-20 DIAGNOSIS — J13 Pneumonia due to Streptococcus pneumoniae: Secondary | ICD-10-CM | POA: Diagnosis not present

## 2023-05-20 DIAGNOSIS — I10 Essential (primary) hypertension: Secondary | ICD-10-CM | POA: Diagnosis not present

## 2023-05-20 LAB — COMPREHENSIVE METABOLIC PANEL
ALT: 38 U/L (ref 0–44)
AST: 23 U/L (ref 15–41)
Albumin: 2.9 g/dL — ABNORMAL LOW (ref 3.5–5.0)
Alkaline Phosphatase: 52 U/L (ref 38–126)
Anion gap: 6 (ref 5–15)
BUN: 28 mg/dL — ABNORMAL HIGH (ref 8–23)
CO2: 25 mmol/L (ref 22–32)
Calcium: 8.3 mg/dL — ABNORMAL LOW (ref 8.9–10.3)
Chloride: 106 mmol/L (ref 98–111)
Creatinine, Ser: 0.84 mg/dL (ref 0.44–1.00)
GFR, Estimated: >60 mL/min (ref >60)
Glucose, Bld: 68 mg/dL — ABNORMAL LOW (ref 70–99)
Potassium: 4.3 mmol/L (ref 3.5–5.1)
Sodium: 137 mmol/L (ref 135–145)
Total Bilirubin: 0.3 mg/dL (ref <1.2)
Total Protein: 5.7 g/dL — ABNORMAL LOW (ref 6.5–8.1)

## 2023-05-20 LAB — CBC WITH DIFFERENTIAL/PLATELET
Abs Immature Granulocytes: 0.25 10*3/uL — ABNORMAL HIGH (ref 0.00–0.07)
Basophils Absolute: 0 10*3/uL (ref 0.0–0.1)
Basophils Relative: 0 %
Eosinophils Absolute: 0.1 10*3/uL (ref 0.0–0.5)
Eosinophils Relative: 1 %
HCT: 27.6 % — ABNORMAL LOW (ref 36.0–46.0)
Hemoglobin: 8.8 g/dL — ABNORMAL LOW (ref 12.0–15.0)
Immature Granulocytes: 2 %
Lymphocytes Relative: 21 %
Lymphs Abs: 2.5 10*3/uL (ref 0.7–4.0)
MCH: 25.2 pg — ABNORMAL LOW (ref 26.0–34.0)
MCHC: 31.9 g/dL (ref 30.0–36.0)
MCV: 79.1 fL — ABNORMAL LOW (ref 80.0–100.0)
Monocytes Absolute: 1 10*3/uL (ref 0.1–1.0)
Monocytes Relative: 9 %
Neutro Abs: 7.8 10*3/uL — ABNORMAL HIGH (ref 1.7–7.7)
Neutrophils Relative %: 67 %
Platelets: 322 10*3/uL (ref 150–400)
RBC: 3.49 MIL/uL — ABNORMAL LOW (ref 3.87–5.11)
RDW: 17.2 % — ABNORMAL HIGH (ref 11.5–15.5)
WBC: 11.6 10*3/uL — ABNORMAL HIGH (ref 4.0–10.5)
nRBC: 0 % (ref 0.0–0.2)

## 2023-05-20 LAB — MAGNESIUM: Magnesium: 2.2 mg/dL (ref 1.7–2.4)

## 2023-05-20 LAB — PHOSPHORUS: Phosphorus: 3.8 mg/dL (ref 2.5–4.6)

## 2023-05-20 MED ORDER — PREDNISONE 10 MG PO TABS: ORAL_TABLET | ORAL | 0 refills | Status: AC

## 2023-05-20 MED ORDER — CEFDINIR 300 MG PO CAPS
300.0000 mg | ORAL_CAPSULE | Freq: Two times a day (BID) | ORAL | 0 refills | Status: AC
Start: 2023-05-20 — End: 2023-05-24

## 2023-05-20 MED ORDER — CYANOCOBALAMIN 1000 MCG PO TABS: 1000.0000 ug | ORAL_TABLET | Freq: Every day | ORAL | 2 refills | Status: DC

## 2023-05-20 MED ORDER — FERROUS SULFATE 325 (65 FE) MG PO TABS: 325.0000 mg | ORAL_TABLET | Freq: Every day | ORAL | 2 refills | Status: DC

## 2023-05-20 MED ORDER — ALBUTEROL SULFATE HFA 108 (90 BASE) MCG/ACT IN AERS: 2.0000 | INHALATION_SPRAY | RESPIRATORY_TRACT | 2 refills | Status: AC | PRN

## 2023-05-20 MED ORDER — TRELEGY ELLIPTA 100-62.5-25 MCG/ACT IN AEPB: 1.0000 | INHALATION_SPRAY | Freq: Every day | RESPIRATORY_TRACT | 2 refills | Status: DC

## 2023-05-20 NOTE — Plan of Care (Signed)

## 2023-05-20 NOTE — Progress Notes (Signed)
Patient discharged home, all DC paperwork reviewed and sent with pt. Patient left with all personal belongings with son via private vehicle

## 2023-05-20 NOTE — Discharge Instructions (Addendum)
Please take all prescribed medications exactly as instructed including completing your course of antibiotics for your pneumonia and your taper of prednisone for your COPD/asthma exacerbation. You have additionally been placed on vitamin B12 and iron supplementation for low iron and vitamin B12 levels during this hospitalization.  Please discuss with your primary care provider about potential outpatient workup for your iron deficiency. Please consume a low-sodium diet diet Please increase your physical activity as tolerated. Please maintain all outpatient follow-up appointments including follow-up with your primary care provider and your pulmonologist Please return to the emergency department if you develop worsening shortness of breath, fevers in excess of 100.4 F, weakness or inability to tolerate oral intake. Please abstain from smoking.

## 2023-05-20 NOTE — Discharge Summary (Addendum)
Physician Discharge Summary   Patient: Terri Burns MRN: [id number] DOB: 12-12-1950  Admit date:     05/14/2023  Discharge date: 05/20/23  Discharge Physician: Marinda Elk   PCP: Louis Matte, MD   Recommendations at discharge:   Please take all prescribed medications exactly as instructed including completing your course of antibiotics for your pneumonia and your taper of prednisone for your COPD/asthma exacerbation. You have additionally been placed on vitamin B12 and iron supplementation for low iron and vitamin B12 levels during this hospitalization.  Please discuss with your primary care provider about potential outpatient workup for your iron deficiency. Please consume a low-sodium diet diet Please increase your physical activity as tolerated. Please maintain all outpatient follow-up appointments including follow-up with your primary care provider and your pulmonologist Please return to the emergency department if you develop worsening shortness of breath, fevers in excess of 100.4 F, weakness or inability to tolerate oral intake. Please abstain from smoking.  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 [facility] Course:  6 yof  w/.COPD, asthma, depression, hypertension, dyslipidemia, CAD s/p CABG and stent, G1DD w/ normal LVEF 10/2021, and GERD presenting to [facility] emergency department with complaints of shortness of breath for 1 week with associated  cough.    Upon evaluation in the emergency department patient felt to be suffering from COPD and asthma exacerbation.  The hospitalist group was then called to assess the patient for admission to the hospital.  CT angiogram of the chest revealed no evidence of pulmonary embolism but did reveal a possible new right upper lobe pneumonia with evidence of bronchiectasis.  Antibiotics were adjusted from ceftriaxone to cefepime to include pseudomonal coverage.   Patient was additionally treated with aggressive bronchodilator therapy and systemic steroids.  Strep pneumoniae antigen was found to be positive and felt to be the culprit for the patient's pneumonia and likely what exacerbated her COPD.  Due to patient's advanced COPD, recovery was slow over the next few days.  Patient was eventually weaned off of supplemental oxygen with improving shortness of breath and wheezing.  Patient was eventually able to be weaned to room air and was able to ambulate and exhibit physical activity near her baseline without any new documented need for supplemental oxygen.  Over the course of the hospitalization, patient was found to be notably anemic.  This was found to be multifactorial secondary to low vitamin B12 levels as well as iron deficiency.  Patient was provided both intravenous iron infusion and intramuscular vitamin B12.  At time of discharge patient has been placed on both vitamin B12 and iron supplementation.  Patient has been advised to follow-up with her primary care providers surrounding the potential need for further workup of her iron deficiency although her advanced COPD may make this difficult.  That being said, patient still reports smoking and was counseled extensively on smoking cessation throughout the hospitalization.  Patient was informed that her COPD is rather advanced and that upon discharge she must  follow closely with her pulmonologist and cease smoking.  Patient was discharged home in  improved and stable condition on 05/20/2023.  Home going home health physical therapy services were arranged at time of discharge.     Pain control - [employer] Controlled Substance Reporting System database was reviewed. and patient was instructed, not to drive, operate heavy machinery, perform activities at heights, swimming or participation in water activities or provide baby-sitting services while on Pain, Sleep and Anxiety Medications; until their outpatient Physician has advised to do so again. Also recommended to not to take more than prescribed Pain, Sleep and Anxiety Medications.   Consultants: None Procedures performed: none  Disposition: Home health Diet recommendation:  Cardiac diet  DISCHARGE MEDICATION: Allergies as of 05/20/2023       Reactions   Levofloxacin Shortness Of Breath   Penicillins Hives   itching   Meloxicam    dizziness   Nsaids Other (See Comments)   Patient states she can tolerate up to three doses per day without incident        Medication List     STOP taking these medications    Accu-Chek Guide w/Device Kit   azithromycin 250 MG tablet Commonly known as: ZITHROMAX       TAKE these medications    Accu-Chek Guide test strip Generic drug: glucose blood USE AS DIRECTED TO Check blood glucose   Accu-Chek Softclix Lancets lancets as directed.   albuterol (2.5 MG/3ML) 0.083% nebulizer solution Commonly known as: PROVENTIL Take 3 mLs (2.5 mg total) by nebulization 4 (four) times daily. What changed: Another medication with the same name was changed. Make sure you understand how and when to take each.   albuterol 108 (90 Base) MCG/ACT inhaler Commonly known as: Ventolin HFA Inhale 2 puffs into the lungs every 4 (four) hours as needed for wheezing or shortness of breath. What changed: See the new instructions.   amLODipine 10 MG tablet Commonly known as: NORVASC Take 1 tablet (10 mg total) by mouth daily.   aspirin EC 81 MG  tablet TAKE 1 TABLET BY MOUTH EVERY DAY   atorvastatin 40 MG tablet Commonly known as: LIPITOR Take 2 tablets (80 mg total) by mouth daily.   Brilinta 90 MG Tabs tablet Generic drug: ticagrelor TAKE ONE TABLET BY MOUTH EVERY MORNING and TAKE ONE TABLET BY MOUTH EVERYDAY AT BEDTIME   cefdinir 300 MG capsule Commonly known as: OMNICEF Take 1 capsule (300 mg total) by mouth 2 (two) times daily for 7 doses. First dose the evening of 12/14.   citalopram 20 MG tablet Commonly known as: CELEXA TAKE 1 AND 1/2 TABLETS BY MOUTH EVERY EVENING   cyanocobalamin 1000 MCG tablet Take 1 tablet (1,000 mcg total) by mouth daily. Start taking on: May 21, 2023   ferrous sulfate 325 (65 FE) MG tablet Take 1 tablet (325 mg total) by mouth daily before breakfast. Take on an empty stomach with one cup of juice, preferably orange juice   fluticasone 50 MCG/ACT nasal spray Commonly known as: FLONASE PLACE TWO SPRAYS into BOTH nostrils DAILY What changed:  how much to take how to take this when to take this reasons to take this   HYDROcodone-acetaminophen 5-325 MG tablet Commonly known as: NORCO/VICODIN Take 1 tablet by mouth 3 (three) times daily as needed.   ipratropium-albuterol 0.5-2.5 (3) MG/3ML Soln Commonly known as: DUONEB Take 3 mLs by nebulization every 6 (six) hours as needed.   isosorbide mononitrate 30 MG 24 hr tablet Commonly  known as: IMDUR TAKE 1/2 TABLET BY MOUTH ONCE DAILY   loratadine 10 MG tablet Commonly known as: Allergy Relief Take 1 tablet (10 mg total) by mouth daily.   losartan 100 MG tablet Commonly known as: COZAAR Take 1 tablet (100 mg total) by mouth every evening. What changed: when to take this   metoprolol succinate 25 MG 24 hr tablet Commonly known as: TOPROL-XL Take 1 tablet (25 mg total) by mouth every evening. What changed: when to take this   Narcan 4 MG/0.1ML Liqd nasal spray kit Generic drug: naloxone 1 spray as needed. As needed in  case of overdose   pantoprazole 40 MG tablet Commonly known as: PROTONIX Take 1 tablet (40 mg total) by mouth every evening. What changed: when to take this   predniSONE 10 MG tablet Commonly known as: DELTASONE Take 5 tablets (50 mg total) by mouth daily for 2 days, THEN 4 tablets (40 mg total) daily for 3 days, THEN 3 tablets (30 mg total) daily for 3 days, THEN 2 tablets (20 mg total) daily for 3 days, THEN 1 tablet (10 mg total) daily for 3 days. Start taking on: May 20, 2023 What changed: See the new instructions.   traMADol 50 MG tablet Commonly known as: ULTRAM Take 1 tablet (50 mg total) by mouth 3 (three) times daily.   Trelegy Ellipta 100-62.5-25 MCG/ACT Aepb Generic drug: Fluticasone-Umeclidin-Vilant Inhale 1 puff into the lungs daily.        Follow-up Information     Entzminger, Danton Clap, MD. Schedule an appointment as soon as possible for a visit in 1 week(s).   Specialty: Internal Medicine Contact information: [address] [place] [address] [phone]         Nyoka Cowden, MD. Schedule an appointment as soon as possible for a visit.   Specialty: Pulmonary Disease Contact information: [address] [place] [address] [phone]                 Discharge Exam: Ceasar Mons Weights   05/14/23 1853  Weight: 53.5 kg    Constitutional: Awake alert and oriented x3, no associated distress.   Respiratory: Mild expiratory wheezing, intermittent, slightly prolonged Tory phase.  Improved air movement compared to prior in the hospitalization.   Cardiovascular: Regular rate and rhythm, no murmurs / rubs / gallops. No extremity edema. 2+ pedal pulses. No carotid bruits.  Abdomen: Abdomen is soft and nontender.  No evidence of intra-abdominal masses.  Positive bowel sounds noted in all quadrants.   Musculoskeletal: No joint deformity upper and lower extremities. Good ROM, no contractures. Normal muscle tone.     Condition at discharge:  fair  The results of significant diagnostics from this hospitalization (including imaging, microbiology, ancillary and laboratory) are listed below for reference.   Imaging Studies: DG Chest Port 1 View Result Date: 05/19/2023 CLINICAL DATA:  Shortness of breath.  History of COPD/asthma. EXAM: PORTABLE CHEST 1 VIEW COMPARISON:  CT 05/15/2023.  Radiographs 05/14/2023 and 01/05/2022. FINDINGS: 0715 hours. The heart size and mediastinal contours are stable with aortic atherosclerosis. Chronic findings of emphysema with bibasilar scarring and chronic blunting of the left costophrenic angle. The small nodular density at the right lung apex seen on recent chest CT may be minimally improved, although is suboptimally evaluated radiographically. No progressive airspace disease, pneumothorax or significant pleural effusion. The bones appear unchanged status post lower cervical fusion. IMPRESSION: 1. No evidence of acute cardiopulmonary process. Chronic findings of emphysema and bibasilar scarring. 2.  The small nodular density at the right lung apex seen on recent chest CT may be minimally improved, although is suboptimally evaluated radiographically. Electronically Signed   By: Carey Bullocks M.D.   On: 05/19/2023 11:12   CT Angio Chest Pulmonary Embolism (PE) W or WO Contrast Result Date: 05/15/2023 CLINICAL DATA:  Respiratory distress.  COPD exacerbation. EXAM: CT ANGIOGRAPHY CHEST WITH CONTRAST TECHNIQUE: Multidetector CT imaging of the chest was performed using the standard protocol during bolus administration of intravenous contrast. Multiplanar CT image reconstructions and MIPs were obtained to evaluate the vascular anatomy. RADIATION DOSE REDUCTION: This exam was performed according to the departmental dose-optimization program which includes automated exposure control, adjustment of the mA and/or kV according to patient size and/or use of iterative reconstruction technique. CONTRAST:  75mL OMNIPAQUE IOHEXOL  350 MG/ML SOLN COMPARISON:  02/28/2022 FINDINGS: Cardiovascular: Heart is normal size. Extensive coronary artery and aortic calcifications. No evidence of aortic aneurysm. No filling defects in the pulmonary arteries to suggest pulmonary emboli. Mediastinum/Nodes: No mediastinal, hilar, or axillary adenopathy. Trachea and esophagus are unremarkable. Thyroid unremarkable. Lungs/Pleura: Centrilobular emphysema. Nodular area and surrounding ground-glass in the right upper lobe is new since prior study, measuring up to 1.8 cm. Mucoid impaction noted in the lower lobe airways. Mild bronchiectasis in the lower lobes. Peribronchovascular nodularity in the mid and lower lungs compatible with scarring, stable since prior study. No effusions. Upper Abdomen: No acute findings Musculoskeletal: Chest wall soft tissues are unremarkable. No acute bony abnormality. Review of the MIP images confirms the above findings. IMPRESSION: No evidence of pulmonary embolus. Diffuse coronary artery disease. Bronchiectasis with mucoid impaction and peribronchovascular scarring, stable. New nodular area in the right upper lobe, favor infectious/inflammatory. This could be followed with repeat CT in 3-6 months. Aortic Atherosclerosis (ICD10-I70.0) and Emphysema (ICD10-J43.9). Electronically Signed   By: Charlett Nose M.D.   On: 05/15/2023 00:25   DG Chest Portable 1 View Result Date: 05/14/2023 CLINICAL DATA:  Shortness of breath, cough EXAM: PORTABLE CHEST 1 VIEW COMPARISON:  01/05/2022 FINDINGS: Heart and mediastinal contours within normal limits. Aortic atherosclerosis. Patchy opacity projects over the right upper lobe. Nodular densities project over the left lower lung compatible with bronchiectasis and scarring seen on prior CT. No effusions. No acute bony abnormality. IMPRESSION: Patchy airspace opacity over the right upper lobe concerning for pneumonia. Followup PA and lateral chest X-ray is recommended in 3-4 weeks following trial of  antibiotic therapy to ensure resolution and exclude underlying malignancy. Electronically Signed   By: Charlett Nose M.D.   On: 05/14/2023 19:37    Microbiology: Results for orders placed or performed during the hospital encounter of 05/14/23  SARS Coronavirus 2 by RT PCR (hospital order, performed in [facility] hospital lab) *cepheid single result test* Anterior Nasal Swab     Status: None   Collection Time: 05/14/23  7:50 PM   Specimen: Anterior Nasal Swab  Result Value Ref Range Status   SARS Coronavirus 2 by RT PCR NEGATIVE NEGATIVE Final    Comment: (NOTE) SARS-CoV-2 target nucleic acids are NOT DETECTED.  The SARS-CoV-2 RNA is generally detectable in upper and lower respiratory specimens during the acute phase of infection. The lowest concentration of SARS-CoV-2 viral copies this assay can detect is 250 copies / mL. A negative result does not preclude SARS-CoV-2 infection and should not be used as the sole basis for treatment or other patient management decisions.  A negative result may occur with improper specimen collection / handling, submission of specimen  other than nasopharyngeal swab, presence of viral mutation(s) within the areas targeted by this assay, and inadequate number of viral copies (<250 copies / mL). A negative result must be combined with clinical observations, patient history, and epidemiological information.  Fact Sheet for Patients:   RoadLapTop.co.za  Fact Sheet for Healthcare Providers: http://kim-Mainer.com/  This test is not yet approved or  cleared by the [place] FDA and has been authorized for detection and/or diagnosis of SARS-CoV-2 by FDA under an Emergency Use Authorization (EUA).  This EUA will remain in effect (meaning this test can be used) for the duration of the COVID-19 declaration under Section 564(b)(1) of the Act, 21 U.S.C. section 360bbb-3(b)(1), unless the authorization is terminated  or revoked sooner.  Performed at [facility], [address] [place], [place] [address]     Labs: CBC: Recent Labs  Lab 05/14/23 1903 05/15/23 0500 05/16/23 0358 05/17/23 0421 05/18/23 0457 05/19/23 1103 05/20/23 0451  WBC 10.5   < > 11.9* 10.2 10.4 14.5* 11.6*  NEUTROABS 6.9  --   --   --  8.7* 12.1* 7.8*  HGB 8.6*   < > 8.4* 8.8* 8.7* 8.6* 8.8*  HCT 27.2*   < > 26.4* 28.0* 27.2* 27.8* 27.6*  MCV 79.8*   < > 78.6* 79.8* 77.3* 78.5* 79.1*  PLT 318   < > 316 345 350 381 322   < > = values in this interval not displayed.   Basic Metabolic Panel: Recent Labs  Lab 05/16/23 0358 05/17/23 0421 05/18/23 0457 05/19/23 1103 05/20/23 0451  NA 138 136 138 133* 137  K 3.5 4.4 4.5 4.2 4.3  CL 107 105 105 104 106  CO2 24 24 28 23 25   GLUCOSE 135* 121* 120* 83 68*  BUN 17 21 24* 23 28*  CREATININE 0.64 0.78 0.72 0.87 0.84  CALCIUM 8.2* 8.2* 8.2* 8.2* 8.3*  MG  --   --  2.1 2.1 2.2  PHOS  --   --   --  2.8 3.8   Liver Function Tests: Recent Labs  Lab 05/14/23 1903 05/15/23 0406 05/18/23 0457 05/19/23 1103 05/20/23 0451  AST 33 21 22 30 23   ALT 29 26 30  41 38  ALKPHOS 68 65 59 60 52  BILITOT 1.0 0.5 0.4 0.4 0.3  PROT 7.1 6.2* 6.0* 5.9* 5.7*  ALBUMIN 3.3* 3.0* 2.9* 2.9* 2.9*   CBG: No results for input(s): "GLUCAP" in the last 168 hours.  Discharge time spent: greater than 30 minutes.  Signed: Marinda Elk, MD [facility] 05/20/2023

## 2023-05-20 NOTE — TOC Transition Note (Signed)
Transition of Care [facility]) - Discharge Note   Patient Details  Name: Terri Burns MRN: [id number] Date of Birth: 03-09-1951  Transition of Care [facility] CM/SW Contact:  Bing Quarry, RN Phone Number: 05/20/2023, 9:56 AM   Clinical Narrative: 12/24: 05/14/23 Admitted  via  [facility] ED from home with SOB not relived by nebulizer home meds/treatment. Dx: COPD with status asthmaticus per [facility] problem list. Per provider patient is discharging today with HH orders for PT. Set up with [facility] on 05/16/23 by prior RN CM. Contacted [facility] HH agency on call representative to confirm and inform of today's discharge plan. Start of service within 48 hours per weekend on call contact Shakela (sp?), covering for [place].   Per PT/OT evaluations: Prior to this admission patient was living alone, independent with mobility and ADL's with assistive device: Rollator.  Has routine help from grandson for  housekeeping tasks on a weekly basis. Patient noted to have declined SDOH assessment on 05/17/23, except for food insecurity which did not require intervention.  HH via [facility] for PT; orders in.  Start of service within 48 hours per [facility] rep.  No DME orders.  Patient has PCP and insurance coverage via [facility]. Cardiologist: Dr. Lorine Bears. [phone] PCP: Sumner Boast [phone].  Specific follow up recommendations pending DC Summary/AVS completion. Please contact TOC for any further needs prior to discharge from acute hospital setting. See contact information for weekends only below.    Gabriel Cirri MSN RN CM  Care Management Department.  [place]  [facility] Direct Dial: [phone] Main Office Phone: [phone] Weekends Only      Final next level of care: Home w Home Health Services     Patient Goals and CMS Choice     Choice offered to / list presented to : Patient      Discharge Placement                       Discharge Plan and  Services Additional resources added to the After Visit Summary for                  DME Arranged: N/A DME Agency: NA       HH Arranged: PT HH Agency: [facility] Date HH Agency Contacted: 05/20/23 Time HH Agency Contacted: [phone] Representative spoke with at [facility] Agency: Mearl Latin for [place] at [facility] weekend coverage to confirm and inform of discharge today.  Social Drivers of Health (SDOH) Interventions SDOH Screenings   Food Insecurity: No Food Insecurity (05/17/2023)  Housing: Patient Declined (05/17/2023)  Transportation Needs: Patient Declined (05/17/2023)  Utilities: Patient Declined (05/17/2023)  Alcohol Screen: Low Risk  (07/13/2021)  Depression (PHQ2-9): Low Risk  (05/11/2022)  Financial Resource Strain: Low Risk  (07/13/2021)  Physical Activity: Insufficiently Active (07/13/2021)  Social Connections: Moderately Integrated (07/13/2021)  Stress: No Stress Concern Present (07/13/2021)  Tobacco Use: High Risk (05/17/2023)     Readmission Risk Interventions     No data to display

## 2023-06-05 ENCOUNTER — Other Ambulatory Visit: Payer: Self-pay | Admitting: *Deleted

## 2023-06-05 DIAGNOSIS — Z87891 Personal history of nicotine dependence: Secondary | ICD-10-CM

## 2023-06-05 DIAGNOSIS — R911 Solitary pulmonary nodule: Secondary | ICD-10-CM

## 2023-06-08 ENCOUNTER — Inpatient Hospital Stay
Admission: EM | Admit: 2023-06-08 | Discharge: 2023-06-20 | DRG: 193 | Disposition: A | Discharge status: Home Health | Payer: 59 | Attending: Internal Medicine | Admitting: Internal Medicine

## 2023-06-08 ENCOUNTER — Other Ambulatory Visit: Payer: Self-pay

## 2023-06-08 ENCOUNTER — Emergency Department: Payer: 59

## 2023-06-08 DIAGNOSIS — Z79899 Other long term (current) drug therapy: Secondary | ICD-10-CM

## 2023-06-08 DIAGNOSIS — E871 Hypo-osmolality and hyponatremia: Secondary | ICD-10-CM | POA: Diagnosis present

## 2023-06-08 DIAGNOSIS — R911 Solitary pulmonary nodule: Secondary | ICD-10-CM | POA: Diagnosis not present

## 2023-06-08 DIAGNOSIS — J189 Pneumonia, unspecified organism: Secondary | ICD-10-CM | POA: Diagnosis not present

## 2023-06-08 DIAGNOSIS — Z7951 Long term (current) use of inhaled steroids: Secondary | ICD-10-CM

## 2023-06-08 DIAGNOSIS — I10 Essential (primary) hypertension: Secondary | ICD-10-CM | POA: Diagnosis present

## 2023-06-08 DIAGNOSIS — Z7982 Long term (current) use of aspirin: Secondary | ICD-10-CM

## 2023-06-08 DIAGNOSIS — J440 Chronic obstructive pulmonary disease with acute lower respiratory infection: Secondary | ICD-10-CM | POA: Diagnosis present

## 2023-06-08 DIAGNOSIS — E785 Hyperlipidemia, unspecified: Secondary | ICD-10-CM | POA: Diagnosis present

## 2023-06-08 DIAGNOSIS — J159 Unspecified bacterial pneumonia: Principal | ICD-10-CM | POA: Diagnosis present

## 2023-06-08 DIAGNOSIS — R5381 Other malaise: Secondary | ICD-10-CM | POA: Diagnosis present

## 2023-06-08 DIAGNOSIS — J9601 Acute respiratory failure with hypoxia: Principal | ICD-10-CM

## 2023-06-08 DIAGNOSIS — Z880 Allergy status to penicillin: Secondary | ICD-10-CM

## 2023-06-08 DIAGNOSIS — K219 Gastro-esophageal reflux disease without esophagitis: Secondary | ICD-10-CM | POA: Diagnosis present

## 2023-06-08 DIAGNOSIS — Z85828 Personal history of other malignant neoplasm of skin: Secondary | ICD-10-CM

## 2023-06-08 DIAGNOSIS — E559 Vitamin D deficiency, unspecified: Secondary | ICD-10-CM | POA: Diagnosis present

## 2023-06-08 DIAGNOSIS — I2510 Atherosclerotic heart disease of native coronary artery without angina pectoris: Secondary | ICD-10-CM | POA: Insufficient documentation

## 2023-06-08 DIAGNOSIS — F17210 Nicotine dependence, cigarettes, uncomplicated: Secondary | ICD-10-CM | POA: Diagnosis present

## 2023-06-08 DIAGNOSIS — Z823 Family history of stroke: Secondary | ICD-10-CM

## 2023-06-08 DIAGNOSIS — Z886 Allergy status to analgesic agent status: Secondary | ICD-10-CM

## 2023-06-08 DIAGNOSIS — Z7902 Long term (current) use of antithrombotics/antiplatelets: Secondary | ICD-10-CM

## 2023-06-08 DIAGNOSIS — F32A Depression, unspecified: Secondary | ICD-10-CM | POA: Diagnosis present

## 2023-06-08 DIAGNOSIS — Z955 Presence of coronary angioplasty implant and graft: Secondary | ICD-10-CM

## 2023-06-08 DIAGNOSIS — Z8701 Personal history of pneumonia (recurrent): Secondary | ICD-10-CM

## 2023-06-08 DIAGNOSIS — E875 Hyperkalemia: Secondary | ICD-10-CM | POA: Diagnosis present

## 2023-06-08 DIAGNOSIS — Z1152 Encounter for screening for COVID-19: Secondary | ICD-10-CM

## 2023-06-08 DIAGNOSIS — R0602 Shortness of breath: Secondary | ICD-10-CM | POA: Diagnosis not present

## 2023-06-08 DIAGNOSIS — Z833 Family history of diabetes mellitus: Secondary | ICD-10-CM

## 2023-06-08 DIAGNOSIS — D509 Iron deficiency anemia, unspecified: Secondary | ICD-10-CM | POA: Diagnosis present

## 2023-06-08 DIAGNOSIS — Z881 Allergy status to other antibiotic agents status: Secondary | ICD-10-CM

## 2023-06-08 DIAGNOSIS — J441 Chronic obstructive pulmonary disease with (acute) exacerbation: Secondary | ICD-10-CM | POA: Diagnosis present

## 2023-06-08 DIAGNOSIS — Z8249 Family history of ischemic heart disease and other diseases of the circulatory system: Secondary | ICD-10-CM

## 2023-06-08 LAB — COMPREHENSIVE METABOLIC PANEL
ALT: 22 U/L (ref 0–44)
AST: 20 U/L (ref 15–41)
Albumin: 3.4 g/dL — ABNORMAL LOW (ref 3.5–5.0)
Alkaline Phosphatase: 50 U/L (ref 38–126)
Anion gap: 11 (ref 5–15)
BUN: 19 mg/dL (ref 8–23)
CO2: 23 mmol/L (ref 22–32)
Calcium: 8.3 mg/dL — ABNORMAL LOW (ref 8.9–10.3)
Chloride: 98 mmol/L (ref 98–111)
Creatinine, Ser: 0.83 mg/dL (ref 0.44–1.00)
GFR, Estimated: >60 mL/min (ref >60)
Glucose, Bld: 101 mg/dL — ABNORMAL HIGH (ref 70–99)
Potassium: 5.2 mmol/L — ABNORMAL HIGH (ref 3.5–5.1)
Sodium: 132 mmol/L — ABNORMAL LOW (ref 135–145)
Total Bilirubin: 0.5 mg/dL (ref 0.0–1.2)
Total Protein: 6.1 g/dL — ABNORMAL LOW (ref 6.5–8.1)

## 2023-06-08 LAB — CBC WITH DIFFERENTIAL/PLATELET
Abs Immature Granulocytes: 0.04 10*3/uL (ref 0.00–0.07)
Basophils Absolute: 0 10*3/uL (ref 0.0–0.1)
Basophils Relative: 0 %
Eosinophils Absolute: 0.3 10*3/uL (ref 0.0–0.5)
Eosinophils Relative: 4 %
HCT: 28.2 % — ABNORMAL LOW (ref 36.0–46.0)
Hemoglobin: 8.6 g/dL — ABNORMAL LOW (ref 12.0–15.0)
Immature Granulocytes: 1 %
Lymphocytes Relative: 11 %
Lymphs Abs: 0.8 10*3/uL (ref 0.7–4.0)
MCH: 26.5 pg (ref 26.0–34.0)
MCHC: 30.5 g/dL (ref 30.0–36.0)
MCV: 87 fL (ref 80.0–100.0)
Monocytes Absolute: 0.9 10*3/uL (ref 0.1–1.0)
Monocytes Relative: 12 %
Neutro Abs: 5.4 10*3/uL (ref 1.7–7.7)
Neutrophils Relative %: 72 %
Platelets: 219 10*3/uL (ref 150–400)
RBC: 3.24 MIL/uL — ABNORMAL LOW (ref 3.87–5.11)
RDW: 23.5 % — ABNORMAL HIGH (ref 11.5–15.5)
Smear Review: NORMAL
WBC: 7.4 10*3/uL (ref 4.0–10.5)
nRBC: 0 % (ref 0.0–0.2)

## 2023-06-08 LAB — RESP PANEL BY RT-PCR (RSV, FLU A&B, COVID)  RVPGX2
Influenza A by PCR: NEGATIVE
Influenza B by PCR: NEGATIVE
Resp Syncytial Virus by PCR: NEGATIVE
SARS Coronavirus 2 by RT PCR: NEGATIVE

## 2023-06-08 LAB — APTT: aPTT: 31 s (ref 24–36)

## 2023-06-08 LAB — PROTIME-INR
INR: 1 (ref 0.8–1.2)
Prothrombin Time: 13.4 s (ref 11.4–15.2)

## 2023-06-08 LAB — LACTIC ACID, PLASMA
Lactic Acid, Venous: 0.7 mmol/L (ref 0.5–1.9)
Lactic Acid, Venous: 0.9 mmol/L (ref 0.5–1.9)

## 2023-06-08 LAB — TROPONIN I (HIGH SENSITIVITY): Troponin I (High Sensitivity): 4 ng/L (ref <18)

## 2023-06-08 LAB — BRAIN NATRIURETIC PEPTIDE: B Natriuretic Peptide: 158.2 pg/mL — ABNORMAL HIGH (ref 0.0–100.0)

## 2023-06-08 MED ORDER — METHYLPREDNISOLONE SODIUM SUCC 125 MG IJ SOLR
125.0000 mg | INTRAMUSCULAR | Status: AC
  Administered 2023-06-08: 125 mg via INTRAVENOUS
  Filled 2023-06-08: qty 2

## 2023-06-08 MED ORDER — IPRATROPIUM-ALBUTEROL 0.5-2.5 (3) MG/3ML IN SOLN
3.0000 mL | Freq: Once | RESPIRATORY_TRACT | Status: AC
  Administered 2023-06-08: 3 mL via RESPIRATORY_TRACT
  Filled 2023-06-08: qty 3

## 2023-06-08 MED ORDER — ONDANSETRON HCL 4 MG/2ML IJ SOLN: 4.0000 mg | Freq: Four times a day (QID) | INTRAMUSCULAR | Status: DC | PRN

## 2023-06-08 MED ORDER — TRAZODONE HCL 50 MG PO TABS: 25.0000 mg | ORAL_TABLET | Freq: Every evening | ORAL | Status: DC | PRN

## 2023-06-08 MED ORDER — IOHEXOL 350 MG/ML SOLN
75.0000 mL | Freq: Once | INTRAVENOUS | Status: AC | PRN
  Administered 2023-06-08: 75 mL via INTRAVENOUS

## 2023-06-08 MED ORDER — ASPIRIN 81 MG PO TBEC
81.0000 mg | DELAYED_RELEASE_TABLET | Freq: Every day | ORAL | Status: DC
  Administered 2023-06-09 – 2023-06-20 (×12): 81 mg via ORAL
  Filled 2023-06-08 (×13): qty 1

## 2023-06-08 MED ORDER — AZITHROMYCIN 500 MG PO TABS
500.0000 mg | ORAL_TABLET | Freq: Once | ORAL | Status: AC
  Administered 2023-06-08: 500 mg via ORAL
  Filled 2023-06-08: qty 1

## 2023-06-08 MED ORDER — METOPROLOL SUCCINATE ER 25 MG PO TB24
25.0000 mg | ORAL_TABLET | Freq: Every day | ORAL | Status: DC
  Administered 2023-06-10 – 2023-06-20 (×10): 25 mg via ORAL
  Filled 2023-06-08 (×13): qty 1

## 2023-06-08 MED ORDER — FERROUS SULFATE 325 (65 FE) MG PO TABS
325.0000 mg | ORAL_TABLET | Freq: Every day | ORAL | Status: DC
  Administered 2023-06-09 – 2023-06-20 (×12): 325 mg via ORAL
  Filled 2023-06-08 (×12): qty 1

## 2023-06-08 MED ORDER — LOSARTAN POTASSIUM 50 MG PO TABS
100.0000 mg | ORAL_TABLET | Freq: Every day | ORAL | Status: DC
  Administered 2023-06-09 – 2023-06-20 (×11): 100 mg via ORAL
  Filled 2023-06-08 (×12): qty 2

## 2023-06-08 MED ORDER — ONDANSETRON HCL 4 MG PO TABS: 4.0000 mg | ORAL_TABLET | Freq: Four times a day (QID) | ORAL | Status: DC | PRN

## 2023-06-08 MED ORDER — HYDROCODONE-ACETAMINOPHEN 5-325 MG PO TABS
1.0000 | ORAL_TABLET | Freq: Three times a day (TID) | ORAL | Status: DC | PRN
  Administered 2023-06-08 – 2023-06-11 (×6): 1 via ORAL
  Filled 2023-06-08 (×6): qty 1

## 2023-06-08 MED ORDER — SODIUM CHLORIDE 0.9 % IV SOLN: INTRAVENOUS | Status: DC

## 2023-06-08 MED ORDER — ACETAMINOPHEN 650 MG RE SUPP: 650.0000 mg | Freq: Four times a day (QID) | RECTAL | Status: DC | PRN

## 2023-06-08 MED ORDER — IPRATROPIUM-ALBUTEROL 0.5-2.5 (3) MG/3ML IN SOLN
3.0000 mL | Freq: Four times a day (QID) | RESPIRATORY_TRACT | Status: DC
  Administered 2023-06-08 – 2023-06-11 (×10): 3 mL via RESPIRATORY_TRACT
  Filled 2023-06-08 (×10): qty 3

## 2023-06-08 MED ORDER — VITAMIN B-12 1000 MCG PO TABS
1000.0000 ug | ORAL_TABLET | Freq: Every day | ORAL | Status: DC
  Administered 2023-06-09 – 2023-06-20 (×12): 1000 ug via ORAL
  Filled 2023-06-08 (×4): qty 1
  Filled 2023-06-08: qty 2
  Filled 2023-06-08 (×7): qty 1

## 2023-06-08 MED ORDER — ACETAMINOPHEN 325 MG PO TABS
650.0000 mg | ORAL_TABLET | Freq: Four times a day (QID) | ORAL | Status: DC | PRN
  Administered 2023-06-10 (×2): 650 mg via ORAL
  Filled 2023-06-08 (×2): qty 2

## 2023-06-08 MED ORDER — METHYLPREDNISOLONE SODIUM SUCC 40 MG IJ SOLR
40.0000 mg | Freq: Two times a day (BID) | INTRAMUSCULAR | Status: DC
  Administered 2023-06-08: 40 mg via INTRAVENOUS
  Filled 2023-06-08 (×2): qty 1

## 2023-06-08 MED ORDER — HYDROCOD POLI-CHLORPHE POLI ER 10-8 MG/5ML PO SUER
5.0000 mL | Freq: Two times a day (BID) | ORAL | Status: DC | PRN
  Administered 2023-06-09 – 2023-06-14 (×4): 5 mL via ORAL
  Filled 2023-06-08 (×4): qty 5

## 2023-06-08 MED ORDER — SODIUM CHLORIDE 0.9 % IV SOLN
1.0000 g | INTRAVENOUS | Status: DC
  Administered 2023-06-08: 1 g via INTRAVENOUS
  Filled 2023-06-08: qty 10

## 2023-06-08 MED ORDER — SODIUM CHLORIDE 0.9 % IV BOLUS
500.0000 mL | Freq: Once | INTRAVENOUS | Status: AC
  Administered 2023-06-08: 500 mL via INTRAVENOUS

## 2023-06-08 MED ORDER — GUAIFENESIN ER 600 MG PO TB12
600.0000 mg | ORAL_TABLET | Freq: Two times a day (BID) | ORAL | Status: DC
  Administered 2023-06-08 – 2023-06-09 (×2): 600 mg via ORAL
  Filled 2023-06-08 (×3): qty 1

## 2023-06-08 MED ORDER — ENOXAPARIN SODIUM 40 MG/0.4ML IJ SOSY
40.0000 mg | PREFILLED_SYRINGE | INTRAMUSCULAR | Status: DC
  Administered 2023-06-08 – 2023-06-19 (×12): 40 mg via SUBCUTANEOUS
  Filled 2023-06-08 (×12): qty 0.4

## 2023-06-08 MED ORDER — AMLODIPINE BESYLATE 10 MG PO TABS
10.0000 mg | ORAL_TABLET | Freq: Every day | ORAL | Status: DC
  Administered 2023-06-09 – 2023-06-20 (×12): 10 mg via ORAL
  Filled 2023-06-08 (×4): qty 1
  Filled 2023-06-08: qty 2
  Filled 2023-06-08 (×7): qty 1

## 2023-06-08 MED ORDER — TICAGRELOR 90 MG PO TABS
90.0000 mg | ORAL_TABLET | Freq: Two times a day (BID) | ORAL | Status: DC
  Administered 2023-06-08 – 2023-06-20 (×24): 90 mg via ORAL
  Filled 2023-06-08 (×24): qty 1

## 2023-06-08 MED ORDER — CITALOPRAM HYDROBROMIDE 20 MG PO TABS
20.0000 mg | ORAL_TABLET | Freq: Every day | ORAL | Status: DC
  Administered 2023-06-10 – 2023-06-20 (×11): 20 mg via ORAL
  Filled 2023-06-08 (×12): qty 1

## 2023-06-08 MED ORDER — ISOSORBIDE MONONITRATE ER 30 MG PO TB24
15.0000 mg | ORAL_TABLET | Freq: Every day | ORAL | Status: DC
  Administered 2023-06-09 – 2023-06-20 (×11): 15 mg via ORAL
  Filled 2023-06-08 (×13): qty 1

## 2023-06-08 MED ORDER — ATORVASTATIN CALCIUM 20 MG PO TABS
80.0000 mg | ORAL_TABLET | Freq: Every day | ORAL | Status: DC
  Administered 2023-06-10 – 2023-06-20 (×11): 80 mg via ORAL
  Filled 2023-06-08 (×12): qty 4

## 2023-06-08 MED ORDER — PREDNISONE 20 MG PO TABS: 40.0000 mg | ORAL_TABLET | Freq: Every day | ORAL | Status: DC

## 2023-06-08 MED ORDER — MAGNESIUM HYDROXIDE 400 MG/5ML PO SUSP: 30.0000 mL | Freq: Every day | ORAL | Status: DC | PRN

## 2023-06-08 NOTE — ED Provider Notes (Signed)
 [facility] Provider Note    Event Date/Time   First MD Initiated Contact with Patient 06/08/23 1755     (approximate)   History   Shortness of Breath   HPI  SHANIQUA GUILLOT is a 73 y.o. female   history of COPD pneumonia streptococcal pneumonia    Patient reports increasing shortness of breath the last couple days worse tonight.  Used her albuterol  at home with no improvement  Reports shortness of breath, also noticed some swelling in her lower legs.  No fevers or chills.  Reports wheezing.  Occasional cough.  No pain  Physical Exam   Triage Vital Signs: ED Triage Vitals  Encounter Vitals Group     BP 06/08/23 1758 113/60     Systolic BP Percentile --      Diastolic BP Percentile --      Pulse Rate 06/08/23 1757 77     Resp 06/08/23 1757 18     Temp 06/08/23 1800 98.2 F (36.8 C)     Temp Source 06/08/23 1800 Oral     SpO2 06/08/23 1757 100 %     Weight 06/08/23 1801 124 lb (56.2 kg)     Height 06/08/23 1801 4' 10 (1.473 m)     Head Circumference --      Peak Flow --      Pain Score 06/08/23 1801 0     Pain Loc --      Pain Education --      Exclude from Growth Chart --     Most recent vital signs: Vitals:   06/08/23 2100 06/08/23 2230  BP: 99/85   Pulse: 81   Resp: 11   Temp:  97.9 F (36.6 C)  SpO2: 100%      General: Awake, no distress.  Tolerating 3 L nasal cannula well with reassuring work of breathing at this time CV:  Good peripheral perfusion.  Normal tones and rate Resp:  Normal effort.  Moderate dry crackles and some expiratory wheezing primarily heard in the lower lobes.  No frank Rales or JVD Abd:  No distention.  Soft nontender Other:  Moderate bilateral lower extremity pitting edema about 2+   ED Results / Procedures / Treatments   Labs (all labs ordered are listed, but only abnormal results are displayed) Labs Reviewed  CBC WITH DIFFERENTIAL/PLATELET - Abnormal; Notable for the following components:       Result Value   RBC 3.24 (*)    Hemoglobin 8.6 (*)    HCT 28.2 (*)    RDW 23.5 (*)    All other components within normal limits  BRAIN NATRIURETIC PEPTIDE - Abnormal; Notable for the following components:   B Natriuretic Peptide 158.2 (*)    All other components within normal limits  COMPREHENSIVE METABOLIC PANEL - Abnormal; Notable for the following components:   Sodium 132 (*)    Potassium 5.2 (*)    Glucose, Bld 101 (*)    Calcium  8.3 (*)    Total Protein 6.1 (*)    Albumin 3.4 (*)    All other components within normal limits  RESP PANEL BY RT-PCR (RSV, FLU A&B, COVID)  [id number]  CULTURE, BLOOD (ROUTINE X 2)  CULTURE, BLOOD (ROUTINE X 2)  LACTIC ACID, PLASMA  LACTIC ACID, PLASMA  PROTIME-INR  APTT  BASIC METABOLIC PANEL  CBC  TROPONIN I (HIGH SENSITIVITY)     EKG  And interpreted by me at 1810 heart rate 70 QRS 90 QTc  440 Normal sinus rhythm no evidence of acute ischemia   RADIOLOGY Chest x-ray interpreted by me.  Await final read by radiology, but appears to have chronic scarring no obvious infiltrative process or vascular congestion or edema is noted by me.  Again final read is pending, but due to radiology delay    PROCEDURES:  Critical Care performed: Yes, see critical care procedure note(s)  CRITICAL CARE Performed by: Oneil Budge   Total critical care time: 30 minutes  Critical care time was exclusive of separately billable procedures and treating other patients.  Critical care was necessary to treat or prevent imminent or life-threatening deterioration.  Critical care was time spent personally by me on the following activities: development of treatment plan with patient and/or surrogate as well as nursing, discussions with consultants, evaluation of patient's response to treatment, examination of patient, obtaining history from patient or surrogate, ordering and performing treatments and interventions, ordering and review of laboratory studies, ordering  and review of radiographic studies, pulse oximetry and re-evaluation of patient's condition.  Procedures   MEDICATIONS ORDERED IN ED: Medications  aspirin  EC tablet 81 mg (has no administration in time range)  HYDROcodone -acetaminophen  (NORCO/VICODIN) 5-325 MG per tablet 1 tablet (1 tablet Oral Given 06/08/23 2226)  atorvastatin  (LIPITOR ) tablet 80 mg (has no administration in time range)  amLODipine  (NORVASC ) tablet 10 mg (has no administration in time range)  isosorbide  mononitrate (IMDUR ) 24 hr tablet 15 mg (has no administration in time range)  losartan  (COZAAR ) tablet 100 mg (has no administration in time range)  metoprolol  succinate (TOPROL -XL) 24 hr tablet 25 mg (has no administration in time range)  citalopram  (CELEXA ) tablet 20 mg (has no administration in time range)  ticagrelor  (BRILINTA ) tablet 90 mg (90 mg Oral Given 06/08/23 2224)  cyanocobalamin  (VITAMIN B12) tablet 1,000 mcg (has no administration in time range)  ferrous sulfate  tablet 325 mg (has no administration in time range)  ipratropium-albuterol  (DUONEB) 0.5-2.5 (3) MG/3ML nebulizer solution 3 mL (3 mLs Nebulization Given 06/08/23 2056)  guaiFENesin  (MUCINEX ) 12 hr tablet 600 mg (600 mg Oral Given 06/08/23 2114)  chlorpheniramine-HYDROcodone  (TUSSIONEX) 10-8 MG/5ML suspension 5 mL (has no administration in time range)  enoxaparin  (LOVENOX ) injection 40 mg (40 mg Subcutaneous Given 06/08/23 2056)  0.9 %  sodium chloride  infusion ( Intravenous New Bag/Given 06/08/23 2112)  acetaminophen  (TYLENOL ) tablet 650 mg (has no administration in time range)    Or  acetaminophen  (TYLENOL ) suppository 650 mg (has no administration in time range)  traZODone  (DESYREL ) tablet 25 mg (has no administration in time range)  magnesium  hydroxide (MILK OF MAGNESIA) suspension 30 mL (has no administration in time range)  ondansetron  (ZOFRAN ) tablet 4 mg (has no administration in time range)    Or  ondansetron  (ZOFRAN ) injection 4 mg (has no  administration in time range)  cefTRIAXone  (ROCEPHIN ) 1 g in sodium chloride  0.9 % 100 mL IVPB (0 g Intravenous Stopped 06/08/23 2219)  methylPREDNISolone  sodium succinate (SOLU-MEDROL ) 40 mg/mL injection 40 mg (40 mg Intravenous Given 06/08/23 2055)    Followed by  predniSONE  (DELTASONE ) tablet 40 mg (has no administration in time range)  methylPREDNISolone  sodium succinate (SOLU-MEDROL ) 125 mg/2 mL injection 125 mg (125 mg Intravenous Given 06/08/23 1829)  ipratropium-albuterol  (DUONEB) 0.5-2.5 (3) MG/3ML nebulizer solution 3 mL (3 mLs Nebulization Given 06/08/23 1833)  ipratropium-albuterol  (DUONEB) 0.5-2.5 (3) MG/3ML nebulizer solution 3 mL (3 mLs Nebulization Given 06/08/23 1833)  ipratropium-albuterol  (DUONEB) 0.5-2.5 (3) MG/3ML nebulizer solution 3 mL (3 mLs Nebulization Given 06/08/23 1833)  sodium chloride  0.9 % bolus 500 mL (0 mLs Intravenous Stopped 06/08/23 2004)  iohexol  (OMNIPAQUE ) 350 MG/ML injection 75 mL (75 mLs Intravenous Contrast Given 06/08/23 1923)  azithromycin  (ZITHROMAX ) tablet 500 mg (500 mg Oral Given 06/08/23 2057)     IMPRESSION / MDM / ASSESSMENT AND PLAN / ED COURSE  I reviewed the triage vital signs and the nursing notes.                              Differential diagnosis includes, but is not limited to, acute hypoxic respiratory failure, COPD exacerbation, volume overload specially given her new dyspnea VS/lower extremity edema, however also she appears to have some wheezing cough and long history of COPD.  Check for viral infection no obvious infiltrate or pneumonia noted by me not febrile at this time.  Tolerating 3 L nasal cannula typically on room air.  Giving him nebulizer therapy steroids.  Will also check CT to evaluate for infiltrate or pulmonary embolism given recent hospitalization clear chest x-ray and new onset hypoxia.  Patient's presentation is most consistent with acute presentation with potential threat to life or bodily function.   The patient is on the  cardiac monitor to evaluate for evidence of arrhythmia and/or significant heart rate changes.     Labs revealing BNP elevated at 150 somewhat equivocal.  Chronic anemia and normal white count arguing against obvious acute infection  Consulted with patient accepted to care of the hospitalist by Dr. Lawence, patient is markedly improving after treatment still with mild oxygen requirement but overall work of breathing much improved at time of admission decision.   CT Angio Chest PE W and/or Wo Contrast Result Date: 06/08/2023 CLINICAL DATA:  Pulmonary embolism (PE) suspected, low to intermediate prob, neg D-dimer EXAM: CT ANGIOGRAPHY CHEST WITH CONTRAST TECHNIQUE: Multidetector CT imaging of the chest was performed using the standard protocol during bolus administration of intravenous contrast. Multiplanar CT image reconstructions and MIPs were obtained to evaluate the vascular anatomy. RADIATION DOSE REDUCTION: This exam was performed according to the departmental dose-optimization program which includes automated exposure control, adjustment of the mA and/or kV according to patient size and/or use of iterative reconstruction technique. CONTRAST:  75mL OMNIPAQUE  IOHEXOL  350 MG/ML SOLN COMPARISON:  CT angio chest 05/15/2023, chest x-ray 06/08/2023 FINDINGS: Cardiovascular: Satisfactory opacification of the pulmonary arteries to the segmental level. No evidence of pulmonary embolism. Pulmonary artery measures at the upper limits of normal. Normal heart size. No significant pericardial effusion. The thoracic aorta is normal in caliber. Moderate atherosclerotic plaque of the thoracic aorta. Four-vessel coronary artery calcifications. Mediastinum/Nodes: No enlarged mediastinal, hilar, or axillary lymph nodes. Thyroid gland, trachea, and esophagus demonstrate no significant findings. Lungs/Pleura: Centrilobular at least moderate emphysematous changes. Mild to moderate paraseptal and stones changes. Diffuse bronchial  wall thickening. Right lower lobe 0.8 x 0.5 cm pulmonary nodule with surrounding ground-glass airspace opacity. Interval decrease in cluster of nodule and airspace opacity of right upper lobe measuring 1.1 x 0.4 cm (from 1.9 x 1.6 cm) (5:33). Redemonstration of lingular and left lower lobe subpleural nodular like airspace opacities that are chronic and likely represent scarring. Interval increase in patchy airspace opacities of the dependent right lower lobe with interlobular septal wall thickening. No pulmonary mass. No pleural effusion. No pneumothorax. Upper Abdomen: No acute abnormality. Splenule noted. Severe atherosclerotic plaque. Musculoskeletal: No chest wall abnormality. No suspicious lytic or blastic osseous lesions. No acute displaced fracture. Cervical surgical hardware.  Exaggerated kyphotic curvature of the thoracic spine. Review of the MIP images confirms the above findings. IMPRESSION: 1. No pulmonary nodule. 2. The main pulmonary artery measures at the upper limits of normal-correlate for pulmonary hypertension. 3. Right lower lobe 0.8 x 0.5 cm pulmonary nodule with surrounding ground-glass airspace opacity may be residual infection from CT angio chest 05/14/2023. Interval decrease in cluster of nodule and airspace opacity of right upper lobe measuring 1.1 x 0.4 cm likely representing resolving infection. Recommend follow-up CT in 3 months to evaluate for complete resolution. 4. Interval increase in patchy airspace opacities of the dependent right lower lobe with interlobular septal wall thickening suggestive of infection/inflammation. 5. Small airway disease in the setting of emphysema. 6. Aortic Atherosclerosis (ICD10-I70.0) and Emphysema (ICD10-J43.9). Electronically Signed   By: Morgane  Naveau M.D.   On: 06/08/2023 20:55   DG Chest Port 1 View Result Date: 06/08/2023 CLINICAL DATA:  Questionable sepsis - evaluate for abnormality EXAM: PORTABLE CHEST 1 VIEW COMPARISON:  CT angio chest  06/08/2023, chest x-ray 05/19/2023, CT angio chest 05/15/2023 FINDINGS: The heart and mediastinal contours are within normal limits. Atherosclerotic plaque. Chronic coarsened interstitial markings. No focal consolidation. No pulmonary edema. No pleural effusion. No pneumothorax. No acute osseous abnormality. IMPRESSION: 1. No active disease. 2. Aortic Atherosclerosis (ICD10-I70.0) and Emphysema (ICD10-J43.9). Electronically Signed   By: Morgane  Naveau M.D.   On: 06/08/2023 20:32     FINAL CLINICAL IMPRESSION(S) / ED DIAGNOSES   Final diagnoses:  Acute hypoxic respiratory failure (HCC)     Rx / DC Orders   ED Discharge Orders     None        Note:  This document was prepared using Dragon voice recognition software and may include unintentional dictation errors.   Dicky Anes, MD 06/08/23 2351

## 2023-06-08 NOTE — ED Notes (Signed)
 This NT assisted pt to the restroom and got her back in bed. Pt is now resting comfortably in bed.

## 2023-06-08 NOTE — ED Triage Notes (Signed)
 BIBEMS, coming from home. Dx with PNA x1 week. Increased dyspnea with exertion, sats 805 on RA, placed on CPAP and nitro paste- 100%. 20G L hand. Bilateral pitting edema +2-3, new onset. 150/70, after nitro 120/70. GCS 15. PMH: COPD

## 2023-06-08 NOTE — H&P (Signed)
 [place]   PATIENT NAME: Terri Burns    MR#:  [id number]  DATE OF BIRTH:  1951/05/28  DATE OF ADMISSION:  06/08/2023  PRIMARY CARE PHYSICIAN: Entzminger, Ethridge LABOR, MD   Patient is coming from: Home  REQUESTING/REFERRING PHYSICIAN: Dicky Anes, MD  CHIEF COMPLAINT:   Chief Complaint  Patient presents with  . Shortness of Breath    HISTORY OF PRESENT ILLNESS:  Terri Burns is a 73 y.o. female with medical history significant for COPD, GERD, hypertension, dyslipidemia and depression, who presented to the emergency room with a Kalisetti of worsening dyspnea with associated cough productive of creamy sputum as well as wheezing since yesterday with no fever or chills.  He denied any chest pain or palpitations.  No dysuria, oliguria or hematuria or flank pain.  No nausea or vomiting or abdominal pain.  The patient was initially in significant respiratory distress she was placed on CPAP and was later on tapered to 3 L of O2 by nasal cannula.  ED Course: When the patient came to the ER, vital signs were within normal.  Labs revealed hyponatremia 132 and hyperkalemia 5.2 with calcium  of 8.3 and albumin 3.4 with total bili 6.1.  BNP was 158.2 and high sensitive troponin I was 4.  Lactic acid was 0.7 and later 0.9.  CBC showed anemia with hemoglobin 8.6 and hematocrit 28.2 close to previous levels.  Respiratory panel came back negative.  Coag profile was normal.  Blood cultures were negative. EKG as reviewed by me :  EKG showed normal sinus rhythm with a rate of 74 with no acute findings. Imaging: Portable chest x-ray showed no acute cardiopulmonary disease.  It showed aortic atherosclerosis and emphysema. CTA of the chest revealed the following: 1. No pulmonary nodule. 2. The main pulmonary artery measures at the upper limits of normal-correlate for pulmonary hypertension. 3. Right lower lobe 0.8 x 0.5 cm pulmonary nodule with surrounding ground-glass airspace opacity may be  residual infection from CT angio chest 05/14/2023. Interval decrease in cluster of nodule and airspace opacity of right upper lobe measuring 1.1 x 0.4 cm likely representing resolving infection. Recommend follow-up CT in 3 months to evaluate for complete resolution. 4. Interval increase in patchy airspace opacities of the dependent right lower lobe with interlobular septal wall thickening suggestive of infection/inflammation. 5. Small airway disease in the setting of emphysema. 6. Aortic Atherosclerosis (ICD10-I70.0) and Emphysema   The patient was given 3 DuoNebs, 125 mg of IV Solu-Medrol  and 500 mL IV normal saline bolus.  The patient will be admitted to a medical telemetry observation bed for further evaluation and management PAST MEDICAL HISTORY:   Past Medical History:  Diagnosis Date  . COPD (chronic obstructive pulmonary disease) (HCC)   . Depression   . GERD (gastroesophageal reflux disease)   . 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 Hyperlipidemia   . Hypertension   . 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    PAST SURGICAL HISTORY:   Past Surgical History:  Procedure Laterality Date  . CORONARY STENT INTERVENTION N/A 02/22/2021   Procedure: CORONARY STENT INTERVENTION;  Surgeon: Darron Deatrice LABOR, MD;  Location: [facility] INVASIVE [facility];  Service: Cardiovascular;  Laterality: N/A;  . LEFT HEART CATH AND CORONARY ANGIOGRAPHY N/A 02/22/2021   Procedure: LEFT HEART CATH AND CORONARY ANGIOGRAPHY;  Surgeon: Darron Deatrice LABOR, MD;  Location: [facility] INVAS[facility];  Service: Cardiovascular;  Laterality: N/A;  . NECK SURGERY N/A 10/2003  . SHOULDER SURGERY Left 2004    SOCIAL HISTORY:   Social History   Tobacco Use  . Smoking status: Every Day    Current packs/day: 0.25    Average packs/day: 0.3 packs/day for 50.0 years (12.5 ttl pk-yrs)    Types: Cigarettes    Start date: 12/10/1973  . Smokeless  tobacco: Never  . Tobacco comments:    1-2 ciggs per day - 04/13/2021; has a hx of 2 PPD x 10 years   Substance Use Topics  . Alcohol use: Yes    Alcohol/week: 0.0 standard drinks of alcohol    Comment: occasionally    FAMILY HISTORY:   Family History  Problem Relation Age of Onset  . Hypertension Mother   . Stroke Mother   . Heart disease Father   . Diabetes Father   . Heart disease Daughter   . Pulmonary embolism Daughter   . Hypertension Son   . Breast cancer Neg Hx     DRUG ALLERGIES:   Allergies  Allergen Reactions  . Levofloxacin Shortness Of Breath  . Penicillins Hives    itching  . Meloxicam      dizziness  . Nsaids Other (See Comments)    Patient states she can tolerate up to three doses per day without incident    REVIEW OF SYSTEMS:   ROS As per history of present illness. All pertinent systems were reviewed above. Constitutional, HEENT, cardiovascular, respiratory, GI, GU, musculoskeletal, neuro, psychiatric, endocrine, integumentary and hematologic systems were reviewed and are otherwise negative/unremarkable except for positive findings mentioned above in the HPI.   MEDICATIONS AT HOME:   Prior to Admission medications   Medication Sig Start Date End Date Taking? Authorizing Provider  ACCU-CHEK GUIDE test strip USE AS DIRECTED TO Check blood glucose 03/25/22   [provider]  Accu-Chek Softclix Lancets lancets as directed. 03/25/22   [provider]  albuterol  (PROVENTIL ) (2.5 MG/3ML) 0.083% nebulizer solution Take 3 mLs (2.5 mg total) by nebulization 4 (four) times daily. 01/07/22   Rizwan, Saima, MD  albuterol  (VENTOLIN  HFA) 108 (90 Base) MCG/ACT inhaler Inhale 2 puffs into the lungs every 4 (four) hours as needed for wheezing or shortness of breath. 05/20/23   Shalhoub, Zachary PARAS, MD  amLODipine  (NORVASC ) 10 MG tablet Take 1 tablet (10 mg total) by mouth daily. 01/07/22 05/15/23  Rizwan, Saima, MD  aspirin  81 MG EC tablet TAKE 1 TABLET BY  MOUTH EVERY DAY 04/22/19   Sowles, Krichna, MD  atorvastatin  (LIPITOR ) 40 MG tablet Take 2 tablets (80 mg total) by mouth daily. 09/07/21   Sowles, Krichna, MD  BRILINTA  90 MG TABS tablet TAKE ONE TABLET BY MOUTH EVERY MORNING and TAKE ONE TABLET BY MOUTH EVERYDAY AT BEDTIME 02/15/22   Darron Deatrice LABOR, MD  citalopram  (CELEXA ) 20 MG tablet TAKE 1 AND 1/2 TABLETS BY MOUTH EVERY EVENING 12/08/21   Sowles, Krichna, MD  cyanocobalamin  1000 MCG tablet Take 1 tablet (1,000 mcg total) by mouth daily. 05/21/23   Shalhoub, Zachary PARAS, MD  ferrous sulfate  325 (65 FE) MG tablet Take 1 tablet (325 mg total) by mouth daily before breakfast. Take on an empty stomach with one cup of juice, preferably orange juice 05/20/23   Shalhoub, Zachary PARAS, MD  fluticasone  (FLONASE ) 50 MCG/ACT nasal spray PLACE TWO SPRAYS into BOTH nostrils DAILY Patient taking differently: Place 2 sprays into both nostrils daily as needed. PLACE TWO SPRAYS into BOTH nostrils  DAILY 12/03/21   Sowles, Krichna, MD  Fluticasone -Umeclidin-Vilant (TRELEGY ELLIPTA ) 100-62.5-25 MCG/ACT AEPB Inhale 1 puff into the lungs daily. 05/20/23   Shalhoub, Zachary PARAS, MD  HYDROcodone -acetaminophen  (NORCO/VICODIN) 5-325 MG tablet Take 1 tablet by mouth 3 (three) times daily as needed. 05/06/23   [provider]  ipratropium-albuterol  (DUONEB) 0.5-2.5 (3) MG/3ML SOLN Take 3 mLs by nebulization every 6 (six) hours as needed. 03/11/22   [provider]  isosorbide  mononitrate (IMDUR ) 30 MG 24 hr tablet TAKE 1/2 TABLET BY MOUTH ONCE DAILY 12/06/21   Sowles, Krichna, MD  loratadine  (ALLERGY RELIEF) 10 MG tablet Take 1 tablet (10 mg total) by mouth daily. 09/07/21   Sowles, Krichna, MD  losartan  (COZAAR ) 100 MG tablet Take 1 tablet (100 mg total) by mouth every evening. Patient taking differently: Take 100 mg by mouth daily. 10/05/21   Sowles, Krichna, MD  metoprolol  succinate (TOPROL -XL) 25 MG 24 hr tablet Take 1 tablet (25 mg total) by mouth every evening. Patient  taking differently: Take 25 mg by mouth daily. 10/05/21   Sowles, Krichna, MD  NARCAN  4 MG/0.1ML LIQD nasal spray kit 1 spray as needed. As needed in case of overdose 04/13/21   [provider]  pantoprazole  (PROTONIX ) 40 MG tablet Take 1 tablet (40 mg total) by mouth every evening. Patient taking differently: Take 40 mg by mouth daily. 10/05/21   Sowles, Krichna, MD  traMADol  (ULTRAM ) 50 MG tablet Take 1 tablet (50 mg total) by mouth 3 (three) times daily. Patient not taking: Reported on 05/15/2023 10/05/21   Sowles, Krichna, MD      VITAL SIGNS:  Blood pressure (!) 96/59, pulse 68, temperature 97.9 F (36.6 C), temperature source Axillary, resp. rate 10, height 4' 10 (1.473 m), weight 56.2 kg, SpO2 100%.  PHYSICAL EXAMINATION:  Physical Exam  GENERAL:  73 y.o.-year-old patient lying in the bed with mild respiratory distress with conversational dyspnea. EYES: Pupils equal, round, reactive to light and accommodation. No scleral icterus. Extraocular muscles intact.  HEENT: Head atraumatic, normocephalic. Oropharynx and nasopharynx clear.  NECK:  Supple, no jugular venous distention. No thyroid enlargement, no tenderness.  LUNGS: Diffuse expiratory wheezes with tight expiratory airflow and harsh vesicular breathing with diminished breath basal breath sounds in right basal crackles.  No use of accessory muscles of respiration.  CARDIOVASCULAR: Regular rate and rhythm, S1, S2 normal. No murmurs, rubs, or gallops.  ABDOMEN: Soft, nondistended, nontender. Bowel sounds present. No organomegaly or mass.  EXTREMITIES: No pedal edema, cyanosis, or clubbing.  NEUROLOGIC: Cranial nerves II through XII are intact. Muscle strength 5/5 in all extremities. Sensation intact. Gait not checked.  PSYCHIATRIC: The patient is alert and oriented x 3.  Normal affect and good eye contact. SKIN: No obvious rash, lesion, or ulcer.   LABORATORY PANEL:   CBC Recent Labs  Lab 06/08/23 1820  WBC 7.4  HGB 8.6*   HCT 28.2*  PLT 219   ------------------------------------------------------------------------------------------------------------------  Chemistries  Recent Labs  Lab 06/08/23 1820  NA 132*  K 5.2*  CL 98  CO2 23  GLUCOSE 101*  BUN 19  CREATININE 0.83  CALCIUM  8.3*  AST 20  ALT 22  ALKPHOS 50  BILITOT 0.5   ------------------------------------------------------------------------------------------------------------------  Cardiac Enzymes No results for input(s): TROPONINI in the last 168 hours. ------------------------------------------------------------------------------------------------------------------  RADIOLOGY:  CT Angio Chest PE W and/or Wo Contrast Result Date: 06/08/2023 CLINICAL DATA:  Pulmonary embolism (PE) suspected, low to intermediate prob, neg D-dimer EXAM: CT ANGIOGRAPHY CHEST WITH CONTRAST TECHNIQUE: Multidetector  CT imaging of the chest was performed using the standard protocol during bolus administration of intravenous contrast. Multiplanar CT image reconstructions and MIPs were obtained to evaluate the vascular anatomy. RADIATION DOSE REDUCTION: This exam was performed according to the departmental dose-optimization program which includes automated exposure control, adjustment of the mA and/or kV according to patient size and/or use of iterative reconstruction technique. CONTRAST:  75mL OMNIPAQUE  IOHEXOL  350 MG/ML SOLN COMPARISON:  CT angio chest 05/15/2023, chest x-ray 06/08/2023 FINDINGS: Cardiovascular: Satisfactory opacification of the pulmonary arteries to the segmental level. No evidence of pulmonary embolism. Pulmonary artery measures at the upper limits of normal. Normal heart size. No significant pericardial effusion. The thoracic aorta is normal in caliber. Moderate atherosclerotic plaque of the thoracic aorta. Four-vessel coronary artery calcifications. Mediastinum/Nodes: No enlarged mediastinal, hilar, or axillary lymph nodes. Thyroid gland, trachea,  and esophagus demonstrate no significant findings. Lungs/Pleura: Centrilobular at least moderate emphysematous changes. Mild to moderate paraseptal and stones changes. Diffuse bronchial wall thickening. Right lower lobe 0.8 x 0.5 cm pulmonary nodule with surrounding ground-glass airspace opacity. Interval decrease in cluster of nodule and airspace opacity of right upper lobe measuring 1.1 x 0.4 cm (from 1.9 x 1.6 cm) (5:33). Redemonstration of lingular and left lower lobe subpleural nodular like airspace opacities that are chronic and likely represent scarring. Interval increase in patchy airspace opacities of the dependent right lower lobe with interlobular septal wall thickening. No pulmonary mass. No pleural effusion. No pneumothorax. Upper Abdomen: No acute abnormality. Splenule noted. Severe atherosclerotic plaque. Musculoskeletal: No chest wall abnormality. No suspicious lytic or blastic osseous lesions. No acute displaced fracture. Cervical surgical hardware. Exaggerated kyphotic curvature of the thoracic spine. Review of the MIP images confirms the above findings. IMPRESSION: 1. No pulmonary nodule. 2. The main pulmonary artery measures at the upper limits of normal-correlate for pulmonary hypertension. 3. Right lower lobe 0.8 x 0.5 cm pulmonary nodule with surrounding ground-glass airspace opacity may be residual infection from CT angio chest 05/14/2023. Interval decrease in cluster of nodule and airspace opacity of right upper lobe measuring 1.1 x 0.4 cm likely representing resolving infection. Recommend follow-up CT in 3 months to evaluate for complete resolution. 4. Interval increase in patchy airspace opacities of the dependent right lower lobe with interlobular septal wall thickening suggestive of infection/inflammation. 5. Small airway disease in the setting of emphysema. 6. Aortic Atherosclerosis (ICD10-I70.0) and Emphysema (ICD10-J43.9). Electronically Signed   By: Morgane  Naveau M.D.   On:  06/08/2023 20:55   DG Chest Port 1 View Result Date: 06/08/2023 CLINICAL DATA:  Questionable sepsis - evaluate for abnormality EXAM: PORTABLE CHEST 1 VIEW COMPARISON:  CT angio chest 06/08/2023, chest x-ray 05/19/2023, CT angio chest 05/15/2023 FINDINGS: The heart and mediastinal contours are within normal limits. Atherosclerotic plaque. Chronic coarsened interstitial markings. No focal consolidation. No pulmonary edema. No pleural effusion. No pneumothorax. No acute osseous abnormality. IMPRESSION: 1. No active disease. 2. Aortic Atherosclerosis (ICD10-I70.0) and Emphysema (ICD10-J43.9). Electronically Signed   By: Morgane  Naveau M.D.   On: 06/08/2023 20:32      IMPRESSION AND PLAN:  Assessment and Plan: COPD exacerbation (HCC)  - The patient will be admitted to a medically monitored observation bed. - We will place the patient IV steroid therapy with IV Solu-Medrol  as well as nebulized bronchodilator therapy with duonebs q.i.d. and q.4 hours p.r.n.SABRA - Mucolytic therapy will be provided with Mucinex  and antibiotic therapy with IV Rocephin  and Zithromax . - O2 protocol will be followed. -Will hold long-acting beta agonist.  Right lower lobe pneumonia - This is likely community-acquired and bacterial. - Management as above. - We will follow blood cultures.  Acute respiratory failure with hypoxia (HCC) - This is likely secondary to #1 and #2. - Management as above. - O2 protocol will be followed.  Pulmonary nodule This will need follow-up CT  in 3 months.  GERD without esophagitis - We will continue PPI therapy.  Coronary artery disease - We will continue aspirin  and Brilinta . - We will continue Toprol -XL and Imdur .  Dyslipidemia - We will continue statin therapy.  Depression - We will continue Celexa .  Benign essential HTN - We will continue antihypertensive therapy   DVT prophylaxis: Lovenox .  Advanced Care Planning:  Code Status: full code.  Family Communication:  The  plan of care was discussed in details with the patient (and family). I answered all questions. The patient agreed to proceed with the above mentioned plan. Further management will depend upon hospital course. Disposition Plan: Back to previous home environment Consults called: none.  All the records are reviewed and case discussed with ED provider.  Status is: Observation  I certify that at the time of admission, it is my clinical judgment that the patient will require hospital care extending less than 2 midnights.                            Dispo: The patient is from: Home              Anticipated d/c is to: Home              Patient currently is not medically stable to d/c.              Difficult to place patient: No  [place]ison DELENA Peaches M.D on 06/09/2023 at 2:52 AM  [facility]   From 7 PM-7 AM, contact night-coverage [email or web address]  CC: Primary care physician; Entzminger, Ethridge DELENA, MD

## 2023-06-08 NOTE — ED Notes (Addendum)
 Titrated to 2L [place], sating @100 %

## 2023-06-09 DIAGNOSIS — Z833 Family history of diabetes mellitus: Secondary | ICD-10-CM | POA: Diagnosis not present

## 2023-06-09 DIAGNOSIS — J189 Pneumonia, unspecified organism: Secondary | ICD-10-CM | POA: Diagnosis present

## 2023-06-09 DIAGNOSIS — D509 Iron deficiency anemia, unspecified: Secondary | ICD-10-CM | POA: Diagnosis present

## 2023-06-09 DIAGNOSIS — J9601 Acute respiratory failure with hypoxia: Principal | ICD-10-CM

## 2023-06-09 DIAGNOSIS — Z8701 Personal history of pneumonia (recurrent): Secondary | ICD-10-CM | POA: Diagnosis not present

## 2023-06-09 DIAGNOSIS — F32A Depression, unspecified: Secondary | ICD-10-CM | POA: Diagnosis present

## 2023-06-09 DIAGNOSIS — E785 Hyperlipidemia, unspecified: Secondary | ICD-10-CM | POA: Diagnosis present

## 2023-06-09 DIAGNOSIS — Z8249 Family history of ischemic heart disease and other diseases of the circulatory system: Secondary | ICD-10-CM | POA: Diagnosis not present

## 2023-06-09 DIAGNOSIS — F17210 Nicotine dependence, cigarettes, uncomplicated: Secondary | ICD-10-CM | POA: Diagnosis present

## 2023-06-09 DIAGNOSIS — R0602 Shortness of breath: Secondary | ICD-10-CM | POA: Diagnosis present

## 2023-06-09 DIAGNOSIS — R911 Solitary pulmonary nodule: Secondary | ICD-10-CM | POA: Diagnosis present

## 2023-06-09 DIAGNOSIS — E559 Vitamin D deficiency, unspecified: Secondary | ICD-10-CM | POA: Diagnosis present

## 2023-06-09 DIAGNOSIS — I10 Essential (primary) hypertension: Secondary | ICD-10-CM | POA: Diagnosis present

## 2023-06-09 DIAGNOSIS — Z85828 Personal history of other malignant neoplasm of skin: Secondary | ICD-10-CM | POA: Diagnosis not present

## 2023-06-09 DIAGNOSIS — I2510 Atherosclerotic heart disease of native coronary artery without angina pectoris: Secondary | ICD-10-CM | POA: Diagnosis present

## 2023-06-09 DIAGNOSIS — E875 Hyperkalemia: Secondary | ICD-10-CM | POA: Diagnosis present

## 2023-06-09 DIAGNOSIS — J159 Unspecified bacterial pneumonia: Secondary | ICD-10-CM | POA: Diagnosis present

## 2023-06-09 DIAGNOSIS — J441 Chronic obstructive pulmonary disease with (acute) exacerbation: Secondary | ICD-10-CM | POA: Diagnosis present

## 2023-06-09 DIAGNOSIS — Z955 Presence of coronary angioplasty implant and graft: Secondary | ICD-10-CM | POA: Diagnosis not present

## 2023-06-09 DIAGNOSIS — R5381 Other malaise: Secondary | ICD-10-CM | POA: Diagnosis present

## 2023-06-09 DIAGNOSIS — E871 Hypo-osmolality and hyponatremia: Secondary | ICD-10-CM | POA: Diagnosis present

## 2023-06-09 DIAGNOSIS — K219 Gastro-esophageal reflux disease without esophagitis: Secondary | ICD-10-CM | POA: Diagnosis present

## 2023-06-09 DIAGNOSIS — Z1152 Encounter for screening for COVID-19: Secondary | ICD-10-CM | POA: Diagnosis not present

## 2023-06-09 DIAGNOSIS — Z823 Family history of stroke: Secondary | ICD-10-CM | POA: Diagnosis not present

## 2023-06-09 DIAGNOSIS — J440 Chronic obstructive pulmonary disease with acute lower respiratory infection: Secondary | ICD-10-CM | POA: Diagnosis present

## 2023-06-09 DIAGNOSIS — Z886 Allergy status to analgesic agent status: Secondary | ICD-10-CM | POA: Diagnosis not present

## 2023-06-09 LAB — HIV ANTIBODY (ROUTINE TESTING W REFLEX): HIV Screen 4th Generation wRfx: NONREACTIVE

## 2023-06-09 LAB — CBC
HCT: 27.5 % — ABNORMAL LOW (ref 36.0–46.0)
Hemoglobin: 8.4 g/dL — ABNORMAL LOW (ref 12.0–15.0)
MCH: 26.5 pg (ref 26.0–34.0)
MCHC: 30.5 g/dL (ref 30.0–36.0)
MCV: 86.8 fL (ref 80.0–100.0)
Platelets: 210 10*3/uL (ref 150–400)
RBC: 3.17 MIL/uL — ABNORMAL LOW (ref 3.87–5.11)
RDW: 23 % — ABNORMAL HIGH (ref 11.5–15.5)
WBC: 4.3 10*3/uL (ref 4.0–10.5)
nRBC: 0 % (ref 0.0–0.2)

## 2023-06-09 LAB — BASIC METABOLIC PANEL
Anion gap: 8 (ref 5–15)
BUN: 14 mg/dL (ref 8–23)
CO2: 22 mmol/L (ref 22–32)
Calcium: 7.9 mg/dL — ABNORMAL LOW (ref 8.9–10.3)
Chloride: 103 mmol/L (ref 98–111)
Creatinine, Ser: 0.64 mg/dL (ref 0.44–1.00)
GFR, Estimated: >60 mL/min (ref >60)
Glucose, Bld: 191 mg/dL — ABNORMAL HIGH (ref 70–99)
Potassium: 4.8 mmol/L (ref 3.5–5.1)
Sodium: 133 mmol/L — ABNORMAL LOW (ref 135–145)

## 2023-06-09 MED ORDER — METHYLPREDNISOLONE SODIUM SUCC 40 MG IJ SOLR
40.0000 mg | Freq: Two times a day (BID) | INTRAMUSCULAR | Status: DC
  Administered 2023-06-09 – 2023-06-11 (×5): 40 mg via INTRAVENOUS
  Filled 2023-06-09 (×5): qty 1

## 2023-06-09 MED ORDER — ADULT MULTIVITAMIN W/MINERALS CH
1.0000 | ORAL_TABLET | Freq: Every day | ORAL | Status: DC
Start: 2023-06-09 — End: 2023-06-20
  Administered 2023-06-09 – 2023-06-20 (×12): 1 via ORAL
  Filled 2023-06-09 (×12): qty 1

## 2023-06-09 MED ORDER — METHOCARBAMOL 500 MG PO TABS
500.0000 mg | ORAL_TABLET | Freq: Three times a day (TID) | ORAL | Status: DC | PRN
  Administered 2023-06-09 – 2023-06-19 (×5): 500 mg via ORAL
  Filled 2023-06-09 (×6): qty 1

## 2023-06-09 MED ORDER — ARFORMOTEROL TARTRATE 15 MCG/2ML IN NEBU
15.0000 ug | INHALATION_SOLUTION | Freq: Two times a day (BID) | RESPIRATORY_TRACT | Status: DC
Start: 2023-06-09 — End: 2023-06-13
  Administered 2023-06-09 – 2023-06-13 (×9): 15 ug via RESPIRATORY_TRACT
  Filled 2023-06-09 (×10): qty 2

## 2023-06-09 MED ORDER — CEFTRIAXONE SODIUM 1 G IJ SOLR
1.0000 g | Freq: Once | INTRAMUSCULAR | Status: AC
  Administered 2023-06-09: 1 g via INTRAVENOUS
  Filled 2023-06-09: qty 10

## 2023-06-09 MED ORDER — ENSURE ENLIVE PO LIQD
237.0000 mL | Freq: Two times a day (BID) | ORAL | Status: DC
  Administered 2023-06-10 – 2023-06-19 (×16): 237 mL via ORAL

## 2023-06-09 MED ORDER — SODIUM CHLORIDE 0.9 % IV SOLN
500.0000 mg | INTRAVENOUS | Status: DC
  Administered 2023-06-10: 500 mg via INTRAVENOUS
  Filled 2023-06-09 (×2): qty 5

## 2023-06-09 MED ORDER — GUAIFENESIN 100 MG/5ML PO LIQD
10.0000 mL | ORAL | Status: DC
  Administered 2023-06-09 – 2023-06-12 (×14): 10 mL via ORAL
  Filled 2023-06-09 (×14): qty 10

## 2023-06-09 MED ORDER — SODIUM CHLORIDE 0.9 % IV SOLN
2.0000 g | INTRAVENOUS | Status: AC
  Administered 2023-06-09 – 2023-06-12 (×4): 2 g via INTRAVENOUS
  Filled 2023-06-09 (×4): qty 20

## 2023-06-09 NOTE — Progress Notes (Signed)
 Initial Nutrition Assessment  DOCUMENTATION CODES:   Not applicable  INTERVENTION:   -Liberalize diet to regular for widest variety of meal selections -Ensure Enlive po BID, each supplement provides 350 kcal and 20 grams of protein.  -MVI with minerals daily   NUTRITION DIAGNOSIS:   Increased nutrient needs related to chronic illness (COPD) as evidenced by estimated needs.  GOAL:   Patient will meet greater than or equal to 90% of their needs  MONITOR:   PO intake, Supplement acceptance  REASON FOR ASSESSMENT:   Consult Assessment of nutrition requirement/status  ASSESSMENT:   Pt with medical history significant for COPD, GERD, hypertension, dyslipidemia and depression, who presented with a Kalisetti of worsening dyspnea with associated Pt cough productive of creamy sputum as well as wheezing since yesterday with no fever or chills.  Pt admitted with CAP and COPD exacerbation.   Reviewed I/O's: +700 ml x 24 hours  Pt unavailable at time of visit. RD unable to obtain further nutrition-related history or complete nutrition-focused physical exam at this time.    Per RN notes, pt titrated from CPAP to 2 L [place] on 06/07/22.  Pt currently on a heart healthy diet. No meal completion data available to assess at this time.   Reviewed wt hx; wt has been stable over the past year.   Medications reviewed and include celexa , vitamin B-12, ferrous sulfate , and solu-medrol .   Labs reviewed: Na: 133, CBGS: 100 (inpatient orders for glycemic control are none).    Diet Order:   Diet Order             Diet Heart Room service appropriate? Yes; Fluid consistency: Thin  Diet effective now                   EDUCATION NEEDS:   No education needs have been identified at this time  Skin:  Skin Assessment: Reviewed RN Assessment  Last BM:  Unknown  Height:   Ht Readings from Last 1 Encounters:  06/08/23 4' 10 (1.473 m)    Weight:   Wt Readings from Last 1 Encounters:   06/08/23 56.2 kg    Ideal Body Weight:  43.9 kg  BMI:  Body mass index is 25.92 kg/m.  Estimated Nutritional Needs:   Kcal:  1650-1850  Protein:  85-100 grams  Fluid:  > 1.6 L    Margery ORN, RD, LDN, CDCES Registered Dietitian III Certified Diabetes Care and Education Specialist If unable to reach this RD, please use RD Inpatient group chat on secure chat between hours of 8am-4 pm daily

## 2023-06-09 NOTE — Evaluation (Signed)
 Physical Therapy Evaluation Patient Details Name: Terri Burns MRN: [id number] DOB: [birth year] Today's Date: 06/09/2023  History of Present Illness  Pt is a 73 y/o F admitted on 06/08/23 after presenting with c/o worsening dyspnea with cough, sputum production, & wheezing. Pt is being treated for COPD exacerbation, RLL PNA. PMH: COPD, GERD, HTN, dyslipidemia, depression  Clinical Impression  Pt seen for PT evaluation with pt agreeable, son present. Pt reports prior to admission she was ambulatory without AD in the home but has rollator she can use. Pt recently started receiving HHPT & HHOT since last hospital admission. On this date, pt is able to complete bed mobility with supervision, transfers with supervision<>CGA and take a few steps without AD with supervision. Pt is limited by breathing with SpO2 dropping after transfer but not with stepping attempts. PT provides education re: pursed lip breathing & relaxation to help with O2 levels. Pt is hopeful to d/c home vs rehab. Will continue to follow pt acutely to address gait with LRAD, endurance, balance.        If plan is discharge home, recommend the following: A little help with walking and/or transfers;A little help with bathing/dressing/bathroom;Assist for transportation;Help with stairs or ramp for entrance;Assistance with cooking/housework   Can travel by private vehicle        Equipment Recommendations None recommended by PT  Recommendations for Other Services       Functional Status Assessment Patient has had a recent decline in their functional status and demonstrates the ability to make significant improvements in function in a reasonable and predictable amount of time.     Precautions / Restrictions Precautions Precautions: Fall Restrictions Weight Bearing Restrictions Per Provider Order: No      Mobility  Bed Mobility Overal bed mobility: Needs Assistance Bed Mobility: Supine to Sit     Supine to sit: Supervision,  Used rails, HOB elevated          Transfers Overall transfer level: Needs assistance Equipment used: None Transfers: Sit to/from Stand, Bed to chair/wheelchair/BSC Sit to Stand: Supervision   Step pivot transfers: Contact guard assist (bed>recliner on L)       General transfer comment: slight LOB but pt able to correct with CGA    Ambulation/Gait Ambulation/Gait assistance: Supervision Gait Distance (Feet): 2 Feet   Gait Pattern/deviations: Wide base of support, Decreased step length - right, Decreased step length - left, Decreased stride length, Trunk flexed Gait velocity: decreased     General Gait Details: Pt ambulates ~2 ft forwards + backwards without AD without LOB. Pt reports tight BLE heel cords, notes gait is not at baseline. Gait distance limited by pt's breathing/anxiety re: breathing.  Stairs            Wheelchair Mobility     Tilt Bed    Modified Rankin (Stroke Patients Only)       Balance Overall balance assessment: Needs assistance Sitting-balance support: Feet supported Sitting balance-Leahy Scale: Good     Standing balance support: During functional activity, No upper extremity supported Standing balance-Leahy Scale: Fair                               Pertinent Vitals/Pain Pain Assessment Pain Assessment: No/denies pain    Home Living Family/patient expects to be discharged to:: Private residence Living Arrangements: Alone Available Help at Discharge: Family;Available PRN/intermittently;Friend(s) Type of Home: Apartment Home Access: Ramped entrance       Home  Layout: One level Home Equipment: Shower seat;Toilet riser;Rollator (4 wheels)      Prior Function Prior Level of Function : Independent/Modified Independent             Mobility Comments: Has a rollator she uses on occasion for community mobility. ADLs Comments: IND with ADLs, friends/family assist with driving. Otherwise independent for ADL  management. Recently started [facility] OT/PT.     Extremity/Trunk Assessment   Upper Extremity Assessment Upper Extremity Assessment: Overall WFL for tasks assessed    Lower Extremity Assessment Lower Extremity Assessment: Generalized weakness (reports tight heel cords, educated pt on BLE ankle pumps)    Cervical / Trunk Assessment Cervical / Trunk Assessment: Kyphotic Cervical / Trunk Exceptions: baseline R Lateral head Tilt while seated; Hx of Cervical Fx and scoliosis  Communication   Communication Communication: No apparent difficulties Cueing Techniques: Verbal cues  Cognition Arousal: Alert Behavior During Therapy: Anxious Overall Cognitive Status: Within Functional Limits for tasks assessed                                 General Comments: anxious re: breathing        General Comments General comments (skin integrity, edema, etc.): Pt on 2L/min via nasal cannula, SpO2 dropped to 85% with transfer to recliner but maintained >/=90% throughout remainder of session. PT educated pt on pursed lip breathing & importance of breathing technique even when becoming anxious.    Exercises     Assessment/Plan    PT Assessment Patient needs continued PT services  PT Problem List Decreased strength;Cardiopulmonary status limiting activity;Decreased range of motion;Decreased activity tolerance;Decreased knowledge of use of DME;Decreased safety awareness;Decreased balance;Decreased mobility;Decreased knowledge of precautions       PT Treatment Interventions DME instruction;Therapeutic exercise;Gait training;Balance training;Stair training;Neuromuscular re-education;Functional mobility training;Therapeutic activities;Patient/family education;Modalities    PT Goals (Current goals can be found in the Care Plan section)  Acute Rehab PT Goals Patient Stated Goal: get better PT Goal Formulation: With patient Time For Goal Achievement: 06/23/23 Potential to Achieve Goals:  Good    Frequency Min 1X/week     Co-evaluation               AM-PAC PT 6 Clicks Mobility  Outcome Measure Help needed turning from your back to your side while in a flat bed without using bedrails?: None Help needed moving from lying on your back to sitting on the side of a flat bed without using bedrails?: A Little Help needed moving to and from a bed to a chair (including a wheelchair)?: A Little Help needed standing up from a chair using your arms (e.g., wheelchair or bedside chair)?: A Little Help needed to walk in hospital room?: A Little Help needed climbing 3-5 steps with a railing? : A Little 6 Click Score: 19    End of Session Equipment Utilized During Treatment: Oxygen Activity Tolerance: Patient tolerated treatment well;Patient limited by fatigue Patient left: in chair;with family/visitor present;with call bell/phone within reach Nurse Communication: Mobility status PT Visit Diagnosis: Muscle weakness (generalized) (M62.81);Unsteadiness on feet (R26.81)    Time: 9073-9053 PT Time Calculation (min) (ACUTE ONLY): 20 min   Charges:   PT Evaluation $PT Eval Moderate Complexity: 1 Mod   PT General Charges $$ ACUTE PT VISIT: 1 Visit         Richerd Pinal, PT, DPT 06/09/23, 12:47 PM   Richerd CHRISTELLA Pinal 06/09/2023, 12:45 PM

## 2023-06-09 NOTE — Assessment & Plan Note (Addendum)
-   We will continue aspirin and Brilinta. - We will continue Toprol-XL and Imdur.

## 2023-06-09 NOTE — Assessment & Plan Note (Signed)
-   We will continue antihypertensive therapy.

## 2023-06-09 NOTE — Evaluation (Signed)
 Occupational Therapy Evaluation Patient Details Name: Terri Burns MRN: [id number] DOB: December 24, 1950 Today's Date: 06/09/2023   History of Present Illness Terri Burns is a 73 y.o. female with medical history significant for COPD, GERD, hypertension, dyslipidemia and depression, who presented to the emergency room with worsening dyspnea with associated cough productive of creamy sputum as well as wheezing since yesterday with no fever or chills.   Clinical Impression   Terri Burns was seen for OT evaluation this date. Prior to hospital admission, pt was generally independent with ADL management. Pt lives alone in a 1 level apartment home with family/friends available to assist PRN. Pt presents to acute OT demonstrating impaired ADL performance and functional mobility 2/2 generalized weakness, deceased activity tolerance, and limited cardiopulmonary status (See OT problem list for additional functional deficits). Pt currently requires CGA for safety with SPT to BSC, peri-hygiene, and bed mobility. Pt fatigues quickly during session. and would benefit from skilled OT services to address noted impairments and functional limitations (see below for any additional details) in order to maximize safety and independence while minimizing falls risk and caregiver burden. Anticipate the need for follow up OT services in th home setting upon acute hospital DC.        If plan is discharge home, recommend the following: A little help with walking and/or transfers;A little help with bathing/dressing/bathroom;Assistance with cooking/housework;Assist for transportation;Help with stairs or ramp for entrance    Functional Status Assessment  Patient has had a recent decline in their functional status and demonstrates the ability to make significant improvements in function in a reasonable and predictable amount of time.  Equipment Recommendations       Recommendations for Other Services       Precautions /  Restrictions Precautions Precautions: Fall Restrictions Weight Bearing Restrictions Per Provider Order: No      Mobility Bed Mobility Overal bed mobility: Needs Assistance Bed Mobility: Supine to Sit, Sit to Supine     Supine to sit: Supervision, HOB elevated Sit to supine: Supervision, HOB elevated        Transfers Overall transfer level: Needs assistance Equipment used: 1 person hand held assist Transfers: Sit to/from Stand, Bed to chair/wheelchair/BSC Sit to Stand: Contact guard assist     Step pivot transfers: Contact guard assist     General transfer comment: no physical assistance to stand or sit at EOB Pt fatigues quickly endorses fear of falling/feeling unstable with transfers compared to baseline.      Balance Overall balance assessment: Needs assistance Sitting-balance support: Feet supported, Single extremity supported, Bilateral upper extremity supported Sitting balance-Leahy Scale: Fair Sitting balance - Comments: generally maintains at least 1UE support on seated surface.   Standing balance support: During functional activity, Single extremity supported Standing balance-Leahy Scale: Fair Standing balance comment: Quick to fatique and DOE causing increased anxiety                           ADL either performed or assessed with clinical judgement   ADL Overall ADL's : Needs assistance/impaired Eating/Feeding: Set up;Sitting                       Toilet Transfer: Contact guard assist;BSC/3in1;Stand-pivot Toilet Transfer Details (indicate cue type and reason): Increased time/effort to perform transfer to [facility] placed near EOB. Toileting- Clothing Manipulation and Hygiene: Contact guard assist;Sitting/lateral lean;Sit to/from stand Toileting - Clothing Manipulation Details (indicate cue type and reason): Performs peri-care  while seated with SET UP assist. Able to pull up pants and underwear with CGA and assist for mgt of lines/leads.      Functional mobility during ADLs: Contact guard assist General ADL Comments: CGA for safety for brief functional transfers this date.     Vision Patient Visual Report: No change from baseline       Perception         Praxis         Pertinent Vitals/Pain Pain Assessment Pain Assessment: No/denies pain     Extremity/Trunk Assessment Upper Extremity Assessment Upper Extremity Assessment: Generalized weakness   Lower Extremity Assessment Lower Extremity Assessment: Generalized weakness   Cervical / Trunk Assessment Cervical / Trunk Exceptions: baseline R Lateral head Tilt while seated; Hx of Cervical Fx and scoliosis   Communication Communication Communication: No apparent difficulties Cueing Techniques: Verbal cues   Cognition Arousal: Alert Behavior During Therapy: WFL for tasks assessed/performed, Flat affect Overall Cognitive Status: Within Functional Limits for tasks assessed                                 General Comments: A&O x4. Very fatigued but willing to participate in session. Follows all VCs consistently.     General Comments  VSS on 2L [place]    Exercises Other Exercises Other Exercises: Pt educated on role of OT in acute setting, falls prevention strategies, safe toilet transfer, and DC recs.   Shoulder Instructions      Home Living Family/patient expects to be discharged to:: Private residence Living Arrangements: Alone Available Help at Discharge: Family;Available PRN/intermittently;Friend(s) Type of Home: Apartment Home Access: Ramped entrance     Home Layout: One level     Bathroom Shower/Tub: Tub/shower unit (cut out tub)     Bathroom Accessibility: Yes   Home Equipment: Shower seat;Toilet riser;Rollator (4 wheels)          Prior Functioning/Environment Prior Level of Function : Independent/Modified Independent             Mobility Comments: Has a rollator she uses on occasion for community mobility. ADLs  Comments: IND with ADLs, friends/family assist with driving. Otherwise independent for ADL management. Recently started [facility] OT/PT.        OT Problem List: Decreased strength;Pain;Cardiopulmonary status limiting activity;Decreased range of motion;Decreased activity tolerance;Impaired balance (sitting and/or standing);Decreased knowledge of use of DME or AE      OT Treatment/Interventions: Self-care/ADL training;Therapeutic exercise;Therapeutic activities;Energy conservation;DME and/or AE instruction;Patient/family education;Balance training    OT Goals(Current goals can be found in the care plan section) Acute Rehab OT Goals Patient Stated Goal: to go home OT Goal Formulation: With patient Time For Goal Achievement: 06/23/23 Potential to Achieve Goals: Good ADL Goals Pt Will Perform Grooming: sitting;with modified independence Pt Will Perform Lower Body Dressing: sit to/from stand;with modified independence;with adaptive equipment Pt Will Transfer to Toilet: with modified independence;ambulating (c LRAD) Pt Will Perform Toileting - Clothing Manipulation and hygiene: sit to/from stand;sitting/lateral leans;with modified independence (c LRAD)  OT Frequency: Min 1X/week    Co-evaluation              AM-PAC OT 6 Clicks Daily Activity     Outcome Measure Help from another person eating meals?: None Help from another person taking care of personal grooming?: A Little Help from another person toileting, which includes using toliet, bedpan, or urinal?: A Little Help from another person bathing (including washing, rinsing, drying)?: A  Little Help from another person to put on and taking off regular upper body clothing?: A Little Help from another person to put on and taking off regular lower body clothing?: A Little 6 Click Score: 19   End of Session Equipment Utilized During Treatment: Oxygen Nurse Communication: Mobility status  Activity Tolerance: Patient tolerated treatment  well Patient left: in bed;with call bell/phone within reach;with bed alarm set  OT Visit Diagnosis: Other abnormalities of gait and mobility (R26.89);Muscle weakness (generalized) (M62.81);Pain                Time: 9170-9142 OT Time Calculation (min): 28 min Charges:  OT General Charges $OT Visit: 1 Visit OT Evaluation $OT Eval Moderate Complexity: 1 Mod OT Treatments $Self Care/Home Management : 8-22 mins  Jhonny Pelton, M.S., OTR/L 06/09/23, 9:39 AM

## 2023-06-09 NOTE — Progress Notes (Signed)
 PROGRESS NOTE    Terri Burns  FMW:[id number] DOB: 20-May-1951 DOA: 06/08/2023 PCP: Sampson Ethridge LABOR, MD    Brief Narrative:  73 y.o. female with medical history significant for COPD, GERD, hypertension, dyslipidemia and depression, who presented to the emergency room with a [place] of worsening dyspnea with associated cough productive of creamy sputum as well as wheezing since yesterday with no fever or chills.  He denied any chest pain or palpitations.  No dysuria, oliguria or hematuria or flank pain.  No nausea or vomiting or abdominal pain.  The patient was initially in significant respiratory distress she was placed on CPAP and was later on tapered to 3 L of O2 by nasal cannula.    Assessment & Plan:   Principal Problem:   CAP (community acquired pneumonia) Active Problems:   COPD exacerbation (HCC)   Right lower lobe pneumonia   Acute respiratory failure with hypoxia (HCC)   Pulmonary nodule   GERD without esophagitis   Benign essential HTN   Depression   Dyslipidemia   Coronary artery disease   Acute hypoxic respiratory failure (HCC)   Community acquired pneumonia of right lower lobe of lung  COPD exacerbation Community-acquired pneumonia Likely that infectious process exacerbated COPD.  Patient is hemodynamically stable and appears to be slowly improving. Plan: Admit inpatient IV steroids Rocephin  and azithromycin  Scheduled and as needed bronchodilators Wean oxygen as tolerated  Acute hypoxic respiratory failure Secondary to above Management as above Wean oxygen as tolerated  Pulmonary nodules Outpatient pulmonary follow-up  GERD PPI  CAD PTA aspirin  and Brilinta  PTA Toprol  and Imdur   Hyperlipidemia PTA statin  Depression PTA Celexa   Hypertension BP controlled  DVT prophylaxis: SQ Lovenox  Code Status: Full Family Communication: None today Disposition Plan: Status is: Inpatient Remains inpatient appropriate because: COPD  exacerbation   Level of care: Telemetry Medical  Consultants:  None  Procedures:  None  Antimicrobials: Rocephin  Azithromycin    Subjective: Seen and examined.  Sitting up in bed.  Appears winded and fatigued.  No pain complaints  Objective: Vitals:   06/09/23 0500 06/09/23 0741 06/09/23 1030 06/09/23 1231  BP: 98/60 118/68 130/69   Pulse: 69 81 75   Resp: 13 (!) 22 19   Temp:  (!) 97.4 F (36.3 C)  97.8 F (36.6 C)  TempSrc:  Oral    SpO2: 100% 100% 100%   Weight:      Height:        Intake/Output Summary (Last 24 hours) at 06/09/2023 1455 Last data filed at 06/09/2023 0504 Gross per 24 hour  Intake 700 ml  Output --  Net 700 ml   Filed Weights   06/08/23 1801  Weight: 56.2 kg    Examination:  General exam: Appears fatigued and chronically ill Respiratory system: Markedly diminished air entry.  No wheeze.  Normal work of breathing.  3 L Cardiovascular system: S1-S2, RRR, no murmurs, no pedal edema Gastrointestinal system: Soft, NT/ND, normal bowel sounds Central nervous system: Alert and oriented. No focal neurological deficits. Extremities: Symmetrically decreased power Skin: No rashes, lesions or ulcers Psychiatry: Judgement and insight appear normal. Mood & affect appropriate.     Data Reviewed: I have personally reviewed following labs and imaging studies  CBC: Recent Labs  Lab 06/08/23 1820 06/09/23 0350  WBC 7.4 4.3  NEUTROABS 5.4  --   HGB 8.6* 8.4*  HCT 28.2* 27.5*  MCV 87.0 86.8  PLT 219 210   Basic Metabolic Panel: Recent Labs  Lab 06/08/23 1820 06/09/23  0350  NA 132* 133*  K 5.2* 4.8  CL 98 103  CO2 23 22  GLUCOSE 101* 191*  BUN 19 14  CREATININE 0.83 0.64  CALCIUM  8.3* 7.9*   GFR: Estimated Creatinine Clearance: 47.9 mL/min (by C-G formula based on SCr of 0.64 mg/dL). Liver Function Tests: Recent Labs  Lab 06/08/23 1820  AST 20  ALT 22  ALKPHOS 50  BILITOT 0.5  PROT 6.1*  ALBUMIN 3.4*   No results for  input(s): LIPASE, AMYLASE in the last 168 hours. No results for input(s): AMMONIA in the last 168 hours. Coagulation Profile: Recent Labs  Lab 06/08/23 1820  INR 1.0   Cardiac Enzymes: No results for input(s): CKTOTAL, CKMB, CKMBINDEX, TROPONINI in the last 168 hours. BNP (last 3 results) No results for input(s): PROBNP in the last 8760 hours. HbA1C: No results for input(s): HGBA1C in the last 72 hours. CBG: No results for input(s): GLUCAP in the last 168 hours. Lipid Profile: No results for input(s): CHOL, HDL, LDLCALC, TRIG, CHOLHDL, LDLDIRECT in the last 72 hours. Thyroid Function Tests: No results for input(s): TSH, T4TOTAL, FREET4, T3FREE, THYROIDAB in the last 72 hours. Anemia Panel: No results for input(s): VITAMINB12, FOLATE, FERRITIN, TIBC, IRON , RETICCTPCT in the last 72 hours. Sepsis Labs: Recent Labs  Lab 06/08/23 1821 06/08/23 2053  LATICACIDVEN 0.7 0.9    Recent Results (from the past 240 hours)  Resp panel by RT-PCR (RSV, Flu A&B, Covid) Anterior Nasal Swab     Status: None   Collection Time: 06/08/23  6:28 PM   Specimen: Anterior Nasal Swab  Result Value Ref Range Status   SARS Coronavirus 2 by RT PCR NEGATIVE NEGATIVE Final    Comment: (NOTE) SARS-CoV-2 target nucleic acids are NOT DETECTED.  The SARS-CoV-2 RNA is generally detectable in upper respiratory specimens during the acute phase of infection. The lowest concentration of SARS-CoV-2 viral copies this assay can detect is 138 copies/mL. A negative result does not preclude SARS-Cov-2 infection and should not be used as the sole basis for treatment or other patient management decisions. A negative result may occur with  improper specimen collection/handling, submission of specimen other than nasopharyngeal swab, presence of viral mutation(s) within the areas targeted by this assay, and inadequate number of viral copies(<138 copies/mL). A negative  result must be combined with clinical observations, patient history, and epidemiological information. The expected result is Negative.  Fact Sheet for Patients:  bloggercourse.com  Fact Sheet for Healthcare Providers:  seriousbroker.it  This test is no t yet approved or cleared by the [place]  FDA and  has been authorized for detection and/or diagnosis of SARS-CoV-2 by FDA under an Emergency Use Authorization (EUA). This EUA will remain  in effect (meaning this test can be used) for the duration of the COVID-19 declaration under Section 564(b)(1) of the Act, 21 U.S.C.section 360bbb-3(b)(1), unless the authorization is terminated  or revoked sooner.       Influenza A by PCR NEGATIVE NEGATIVE Final   Influenza B by PCR NEGATIVE NEGATIVE Final    Comment: (NOTE) The Xpert Xpress SARS-CoV-2/FLU/RSV plus assay is intended as an aid in the diagnosis of influenza from Nasopharyngeal swab specimens and should not be used as a sole basis for treatment. Nasal washings and aspirates are unacceptable for Xpert Xpress SARS-CoV-2/FLU/RSV testing.  Fact Sheet for Patients: bloggercourse.com  Fact Sheet for Healthcare Providers: seriousbroker.it  This test is not yet approved or cleared by the [place]  FDA and has been authorized for detection  and/or diagnosis of SARS-CoV-2 by FDA under an Emergency Use Authorization (EUA). This EUA will remain in effect (meaning this test can be used) for the duration of the COVID-19 declaration under Section 564(b)(1) of the Act, 21 U.S.C. section 360bbb-3(b)(1), unless the authorization is terminated or revoked.     Resp Syncytial Virus by PCR NEGATIVE NEGATIVE Final    Comment: (NOTE) Fact Sheet for Patients: bloggercourse.com  Fact Sheet for Healthcare Providers: seriousbroker.it  This  test is not yet approved or cleared by the [place]  FDA and has been authorized for detection and/or diagnosis of SARS-CoV-2 by FDA under an Emergency Use Authorization (EUA). This EUA will remain in effect (meaning this test can be used) for the duration of the COVID-19 declaration under Section 564(b)(1) of the Act, 21 U.S.C. section 360bbb-3(b)(1), unless the authorization is terminated or revoked.  Performed at [facility], [address] [place], [place] [address]   Blood Culture (routine x 2)     Status: None (Preliminary result)   Collection Time: 06/08/23  6:30 PM   Specimen: BLOOD  Result Value Ref Range Status   Specimen Description BLOOD LEFT ASSIST CONTROL  Final   Special Requests   Final    BOTTLES DRAWN AEROBIC AND ANAEROBIC Blood Culture adequate volume   Culture   Final    NO GROWTH < 12 HOURS Performed at [facility], [address] [place], [place] [address]    Report Status PENDING  Incomplete  Blood Culture (routine x 2)     Status: None (Preliminary result)   Collection Time: 06/08/23  6:36 PM   Specimen: BLOOD  Result Value Ref Range Status   Specimen Description BLOOD RIGHT ANTECUBITAL  Final   Special Requests BOTTLES DRAWN AEROBIC AND ANAEROBIC BCAV  Final   Culture   Final    NO GROWTH < 12 HOURS Performed at [facility], [address] [place], [place] [address]    Report Status PENDING  Incomplete         Radiology Studies: CT Angio Chest PE W and/or Wo Contrast Result Date: 06/08/2023 CLINICAL DATA:  Pulmonary embolism (PE) suspected, low to intermediate prob, neg D-dimer EXAM: CT ANGIOGRAPHY CHEST WITH CONTRAST TECHNIQUE: Multidetector CT imaging of the chest was performed using the standard protocol during bolus administration of intravenous contrast. Multiplanar CT image reconstructions and MIPs were obtained to evaluate the vascular anatomy. RADIATION DOSE REDUCTION: This exam was performed according  to the departmental dose-optimization program which includes automated exposure control, adjustment of the mA and/or kV according to patient size and/or use of iterative reconstruction technique. CONTRAST:  75mL OMNIPAQUE  IOHEXOL  350 MG/ML SOLN COMPARISON:  CT angio chest 05/15/2023, chest x-ray 06/08/2023 FINDINGS: Cardiovascular: Satisfactory opacification of the pulmonary arteries to the segmental level. No evidence of pulmonary embolism. Pulmonary artery measures at the upper limits of normal. Normal heart size. No significant pericardial effusion. The thoracic aorta is normal in caliber. Moderate atherosclerotic plaque of the thoracic aorta. Four-vessel coronary artery calcifications. Mediastinum/Nodes: No enlarged mediastinal, hilar, or axillary lymph nodes. Thyroid gland, trachea, and esophagus demonstrate no significant findings. Lungs/Pleura: Centrilobular at least moderate emphysematous changes. Mild to moderate paraseptal and stones changes. Diffuse bronchial wall thickening. Right lower lobe 0.8 x 0.5 cm pulmonary nodule with surrounding ground-glass airspace opacity. Interval decrease in cluster of nodule and airspace opacity of right upper lobe measuring 1.1 x 0.4 cm (from 1.9 x 1.6 cm) (5:33). Redemonstration of lingular and left lower lobe subpleural nodular  like airspace opacities that are chronic and likely represent scarring. Interval increase in patchy airspace opacities of the dependent right lower lobe with interlobular septal wall thickening. No pulmonary mass. No pleural effusion. No pneumothorax. Upper Abdomen: No acute abnormality. Splenule noted. Severe atherosclerotic plaque. Musculoskeletal: No chest wall abnormality. No suspicious lytic or blastic osseous lesions. No acute displaced fracture. Cervical surgical hardware. Exaggerated kyphotic curvature of the thoracic spine. Review of the MIP images confirms the above findings. IMPRESSION: 1. No pulmonary nodule. 2. The main pulmonary  artery measures at the upper limits of normal-correlate for pulmonary hypertension. 3. Right lower lobe 0.8 x 0.5 cm pulmonary nodule with surrounding ground-glass airspace opacity may be residual infection from CT angio chest 05/14/2023. Interval decrease in cluster of nodule and airspace opacity of right upper lobe measuring 1.1 x 0.4 cm likely representing resolving infection. Recommend follow-up CT in 3 months to evaluate for complete resolution. 4. Interval increase in patchy airspace opacities of the dependent right lower lobe with interlobular septal wall thickening suggestive of infection/inflammation. 5. Small airway disease in the setting of emphysema. 6. Aortic Atherosclerosis (ICD10-I70.0) and Emphysema (ICD10-J43.9). Electronically Signed   By: Morgane  Naveau M.D.   On: 06/08/2023 20:55   DG Chest Port 1 View Result Date: 06/08/2023 CLINICAL DATA:  Questionable sepsis - evaluate for abnormality EXAM: PORTABLE CHEST 1 VIEW COMPARISON:  CT angio chest 06/08/2023, chest x-ray 05/19/2023, CT angio chest 05/15/2023 FINDINGS: The heart and mediastinal contours are within normal limits. Atherosclerotic plaque. Chronic coarsened interstitial markings. No focal consolidation. No pulmonary edema. No pleural effusion. No pneumothorax. No acute osseous abnormality. IMPRESSION: 1. No active disease. 2. Aortic Atherosclerosis (ICD10-I70.0) and Emphysema (ICD10-J43.9). Electronically Signed   By: Morgane  Naveau M.D.   On: 06/08/2023 20:32        Scheduled Meds:  amLODipine   10 mg Oral Daily   arformoterol   15 mcg Nebulization BID   aspirin  EC  81 mg Oral Daily   atorvastatin   80 mg Oral Daily   citalopram   20 mg Oral Daily   cyanocobalamin   1,000 mcg Oral Daily   enoxaparin  (LOVENOX ) injection  40 mg Subcutaneous Q24H   ferrous sulfate   325 mg Oral QAC breakfast   guaiFENesin   600 mg Oral BID   ipratropium-albuterol   3 mL Nebulization QID   isosorbide  mononitrate  15 mg Oral Daily   losartan    100 mg Oral Daily   methylPREDNISolone  (SOLU-MEDROL ) injection  40 mg Intravenous Q12H   metoprolol  succinate  25 mg Oral Daily   ticagrelor   90 mg Oral BID   Continuous Infusions:  azithromycin      cefTRIAXone  (ROCEPHIN )  IV       LOS: 0 days      Terri KATHEE Robson, MD [facility]   If 7PM-7AM, please contact night-coverage  06/09/2023, 2:55 PM

## 2023-06-09 NOTE — Assessment & Plan Note (Signed)
 -  We will continue statin therapy.

## 2023-06-09 NOTE — Assessment & Plan Note (Signed)
 This will need follow-up CT  in 3 months.

## 2023-06-09 NOTE — Assessment & Plan Note (Signed)
 -  We will continue PPI therapy

## 2023-06-09 NOTE — Assessment & Plan Note (Signed)
-   This is likely secondary to #1 and #2. - Management as above. - O2 protocol will be followed.

## 2023-06-09 NOTE — Assessment & Plan Note (Signed)
-  We will continue Celexa. 

## 2023-06-09 NOTE — Assessment & Plan Note (Addendum)
-   The patient will be admitted to a medically monitored observation bed. - We will place the patient IV steroid therapy with IV Solu-Medrol  as well as nebulized bronchodilator therapy with duonebs q.i.d. and q.4 hours p.r.n.SABRA - Mucolytic therapy will be provided with Mucinex  and antibiotic therapy with IV Rocephin  and Zithromax . - O2 protocol will be followed. -Will hold long-acting beta agonist.

## 2023-06-09 NOTE — Assessment & Plan Note (Addendum)
-   This is likely community-acquired and bacterial. - Management as above. - We will follow blood cultures.

## 2023-06-09 NOTE — ED Notes (Signed)
 Patient heard screaming help me from room.  RN x 2 entered room and patient appeared frozen.  No changes in color or vital signs noted.  Patient responsive to RN and appears disoriented.  After further questioning patient states she was scared and did not know where she was at first.  Stated I think I might have been asleep and I felt like I had to pee.  Able to answer orientation questions.  No weakness noted in all extremities, grips and pedal pulses equal.   Patient was assisted to stand and use bedside commode with standby assist x 2.  Vital signs remained stable.  Will continue to monitor.

## 2023-06-10 ENCOUNTER — Encounter: Payer: Self-pay | Admitting: Internal Medicine

## 2023-06-10 DIAGNOSIS — J189 Pneumonia, unspecified organism: Secondary | ICD-10-CM | POA: Diagnosis not present

## 2023-06-10 MED ORDER — KETOROLAC TROMETHAMINE 15 MG/ML IJ SOLN
15.0000 mg | Freq: Three times a day (TID) | INTRAMUSCULAR | Status: AC
Start: 2023-06-10 — End: 2023-06-15
  Administered 2023-06-10 – 2023-06-15 (×15): 15 mg via INTRAVENOUS
  Filled 2023-06-10 (×15): qty 1

## 2023-06-10 MED ORDER — LIDOCAINE 5 % EX PTCH
1.0000 | MEDICATED_PATCH | CUTANEOUS | Status: DC
  Administered 2023-06-10 – 2023-06-20 (×11): 1 via TRANSDERMAL
  Filled 2023-06-10 (×12): qty 1

## 2023-06-10 MED ORDER — KETOROLAC TROMETHAMINE 15 MG/ML IJ SOLN: 15.0000 mg | Freq: Three times a day (TID) | INTRAMUSCULAR | Status: DC

## 2023-06-10 MED ORDER — AZITHROMYCIN 250 MG PO TABS
500.0000 mg | ORAL_TABLET | Freq: Every day | ORAL | Status: AC
  Administered 2023-06-11 – 2023-06-12 (×2): 500 mg via ORAL
  Filled 2023-06-10 (×2): qty 2

## 2023-06-10 NOTE — Plan of Care (Signed)

## 2023-06-10 NOTE — Progress Notes (Signed)
 PROGRESS NOTE    Terri Burns  FMW:[id number] DOB: [birth year] DOA: 06/08/2023 PCP: Sampson Ethridge LABOR, MD    Brief Narrative:  73 y.o. female with medical history significant for COPD, GERD, hypertension, dyslipidemia and depression, who presented to the emergency room with a [place] of worsening dyspnea with associated cough productive of creamy sputum as well as wheezing since yesterday with no fever or chills.  He denied any chest pain or palpitations.  No dysuria, oliguria or hematuria or flank pain.  No nausea or vomiting or abdominal pain.  The patient was initially in significant respiratory distress she was placed on CPAP and was later on tapered to 3 L of O2 by nasal cannula.    Assessment & Plan:   Principal Problem:   CAP (community acquired pneumonia) Active Problems:   COPD exacerbation (HCC)   Right lower lobe pneumonia   Acute respiratory failure with hypoxia (HCC)   Pulmonary nodule   GERD without esophagitis   Benign essential HTN   Depression   Dyslipidemia   Coronary artery disease   Acute hypoxic respiratory failure (HCC)   Community acquired pneumonia of right lower lobe of lung  COPD exacerbation Community-acquired pneumonia Likely that infectious process exacerbated COPD.  Patient is hemodynamically stable and appears to be slowly improving. Plan: Continue IV Solu-Medrol  Continue Rocephin  and azithromycin  Scheduled and as needed bronchodilators Wean oxygen as tolerated  Acute hypoxic respiratory failure Secondary to above Management as above Wean oxygen as tolerated  Pulmonary nodules Outpatient pulmonary follow-up  GERD PPI  CAD PTA aspirin  and Brilinta  PTA Toprol  and Imdur   Hyperlipidemia PTA statin  Depression PTA Celexa   Hypertension BP controlled  Functional decline Therapy evaluations  DVT prophylaxis: SQ Lovenox  Code Status: Full Family Communication: Left VM for son Riva [phone] on 1/4 Disposition Plan:  Status is: Inpatient Remains inpatient appropriate because: COPD exacerbation   Level of care: Telemetry Medical  Consultants:  None  Procedures:  None  Antimicrobials: Rocephin  Azithromycin    Subjective: Seen and examined.  Somewhat withdrawn this morning.  Objective: Vitals:   06/09/23 2100 06/09/23 2123 06/10/23 0410 06/10/23 0840  BP:  104/62 117/67 126/78  Pulse:  94 93 92  Resp:  18 20 18   Temp:  98.5 F (36.9 C) 98.1 F (36.7 C) 98.3 F (36.8 C)  TempSrc:   Oral Oral  SpO2:  100% 100% 100%  Weight: 54.7 kg     Height: 4' 10 (1.473 m)       Intake/Output Summary (Last 24 hours) at 06/10/2023 1220 Last data filed at 06/10/2023 0919 Gross per 24 hour  Intake 100 ml  Output --  Net 100 ml   Filed Weights   06/08/23 1801 06/09/23 2100  Weight: 56.2 kg 54.7 kg    Examination:  General exam: Appears fatigued.  Chronically ill-appearing Respiratory system: Diminished air entry bilaterally.  No wheeze.  Normal work of breathing.  3 L Cardiovascular system: S1-S2, RRR, no murmurs, no pedal edema Gastrointestinal system: Soft, NT/ND, normal bowel sounds Central nervous system: Alert and oriented. No focal neurological deficits. Extremities: Symmetrically decreased power Skin: No rashes, lesions or ulcers Psychiatry: Judgement and insight appear normal. Mood & affect appropriate.     Data Reviewed: I have personally reviewed following labs and imaging studies  CBC: Recent Labs  Lab 06/08/23 1820 06/09/23 0350  WBC 7.4 4.3  NEUTROABS 5.4  --   HGB 8.6* 8.4*  HCT 28.2* 27.5*  MCV 87.0 86.8  PLT 219 210  Basic Metabolic Panel: Recent Labs  Lab 06/08/23 1820 06/09/23 0350  NA 132* 133*  K 5.2* 4.8  CL 98 103  CO2 23 22  GLUCOSE 101* 191*  BUN 19 14  CREATININE 0.83 0.64  CALCIUM  8.3* 7.9*   GFR: Estimated Creatinine Clearance: 47.2 mL/min (by C-G formula based on SCr of 0.64 mg/dL). Liver Function Tests: Recent Labs  Lab 06/08/23 1820   AST 20  ALT 22  ALKPHOS 50  BILITOT 0.5  PROT 6.1*  ALBUMIN 3.4*   No results for input(s): LIPASE, AMYLASE in the last 168 hours. No results for input(s): AMMONIA in the last 168 hours. Coagulation Profile: Recent Labs  Lab 06/08/23 1820  INR 1.0   Cardiac Enzymes: No results for input(s): CKTOTAL, CKMB, CKMBINDEX, TROPONINI in the last 168 hours. BNP (last 3 results) No results for input(s): PROBNP in the last 8760 hours. HbA1C: No results for input(s): HGBA1C in the last 72 hours. CBG: No results for input(s): GLUCAP in the last 168 hours. Lipid Profile: No results for input(s): CHOL, HDL, LDLCALC, TRIG, CHOLHDL, LDLDIRECT in the last 72 hours. Thyroid Function Tests: No results for input(s): TSH, T4TOTAL, FREET4, T3FREE, THYROIDAB in the last 72 hours. Anemia Panel: No results for input(s): VITAMINB12, FOLATE, FERRITIN, TIBC, IRON , RETICCTPCT in the last 72 hours. Sepsis Labs: Recent Labs  Lab 06/08/23 1821 06/08/23 2053  LATICACIDVEN 0.7 0.9    Recent Results (from the past 240 hours)  Resp panel by RT-PCR (RSV, Flu A&B, Covid) Anterior Nasal Swab     Status: None   Collection Time: 06/08/23  6:28 PM   Specimen: Anterior Nasal Swab  Result Value Ref Range Status   SARS Coronavirus 2 by RT PCR NEGATIVE NEGATIVE Final    Comment: (NOTE) SARS-CoV-2 target nucleic acids are NOT DETECTED.  The SARS-CoV-2 RNA is generally detectable in upper respiratory specimens during the acute phase of infection. The lowest concentration of SARS-CoV-2 viral copies this assay can detect is 138 copies/mL. A negative result does not preclude SARS-Cov-2 infection and should not be used as the sole basis for treatment or other patient management decisions. A negative result may occur with  improper specimen collection/handling, submission of specimen other than nasopharyngeal swab, presence of viral mutation(s) within the areas  targeted by this assay, and inadequate number of viral copies(<138 copies/mL). A negative result must be combined with clinical observations, patient history, and epidemiological information. The expected result is Negative.  Fact Sheet for Patients:  bloggercourse.com  Fact Sheet for Healthcare Providers:  seriousbroker.it  This test is no t yet approved or cleared by the [place]  FDA and  has been authorized for detection and/or diagnosis of SARS-CoV-2 by FDA under an Emergency Use Authorization (EUA). This EUA will remain  in effect (meaning this test can be used) for the duration of the COVID-19 declaration under Section 564(b)(1) of the Act, 21 U.S.C.section 360bbb-3(b)(1), unless the authorization is terminated  or revoked sooner.       Influenza A by PCR NEGATIVE NEGATIVE Final   Influenza B by PCR NEGATIVE NEGATIVE Final    Comment: (NOTE) The Xpert Xpress SARS-CoV-2/FLU/RSV plus assay is intended as an aid in the diagnosis of influenza from Nasopharyngeal swab specimens and should not be used as a sole basis for treatment. Nasal washings and aspirates are unacceptable for Xpert Xpress SARS-CoV-2/FLU/RSV testing.  Fact Sheet for Patients: bloggercourse.com  Fact Sheet for Healthcare Providers: seriousbroker.it  This test is not yet approved or cleared by  the [place]  FDA and has been authorized for detection and/or diagnosis of SARS-CoV-2 by FDA under an Emergency Use Authorization (EUA). This EUA will remain in effect (meaning this test can be used) for the duration of the COVID-19 declaration under Section 564(b)(1) of the Act, 21 U.S.C. section 360bbb-3(b)(1), unless the authorization is terminated or revoked.     Resp Syncytial Virus by PCR NEGATIVE NEGATIVE Final    Comment: (NOTE) Fact Sheet for  Patients: bloggercourse.com  Fact Sheet for Healthcare Providers: seriousbroker.it  This test is not yet approved or cleared by the [place]  FDA and has been authorized for detection and/or diagnosis of SARS-CoV-2 by FDA under an Emergency Use Authorization (EUA). This EUA will remain in effect (meaning this test can be used) for the duration of the COVID-19 declaration under Section 564(b)(1) of the Act, 21 U.S.C. section 360bbb-3(b)(1), unless the authorization is terminated or revoked.  Performed at [facility], [address] [place], [place] [address]   Blood Culture (routine x 2)     Status: None (Preliminary result)   Collection Time: 06/08/23  6:30 PM   Specimen: BLOOD  Result Value Ref Range Status   Specimen Description BLOOD LEFT ASSIST CONTROL  Final   Special Requests   Final    BOTTLES DRAWN AEROBIC AND ANAEROBIC Blood Culture adequate volume   Culture   Final    NO GROWTH 2 DAYS Performed at [facility], [address], [place], [place] [address]    Report Status PENDING  Incomplete  Blood Culture (routine x 2)     Status: None (Preliminary result)   Collection Time: 06/08/23  6:36 PM   Specimen: BLOOD  Result Value Ref Range Status   Specimen Description BLOOD RIGHT ANTECUBITAL  Final   Special Requests BOTTLES DRAWN AEROBIC AND ANAEROBIC BCAV  Final   Culture   Final    NO GROWTH 2 DAYS Performed at [facility], [address] [place], [place] [address]    Report Status PENDING  Incomplete         Radiology Studies: CT Angio Chest PE W and/or Wo Contrast Result Date: 06/08/2023 CLINICAL DATA:  Pulmonary embolism (PE) suspected, low to intermediate prob, neg D-dimer EXAM: CT ANGIOGRAPHY CHEST WITH CONTRAST TECHNIQUE: Multidetector CT imaging of the chest was performed using the standard protocol during bolus administration of intravenous contrast. Multiplanar CT  image reconstructions and MIPs were obtained to evaluate the vascular anatomy. RADIATION DOSE REDUCTION: This exam was performed according to the departmental dose-optimization program which includes automated exposure control, adjustment of the mA and/or kV according to patient size and/or use of iterative reconstruction technique. CONTRAST:  75mL OMNIPAQUE  IOHEXOL  350 MG/ML SOLN COMPARISON:  CT angio chest 05/15/2023, chest x-ray 06/08/2023 FINDINGS: Cardiovascular: Satisfactory opacification of the pulmonary arteries to the segmental level. No evidence of pulmonary embolism. Pulmonary artery measures at the upper limits of normal. Normal heart size. No significant pericardial effusion. The thoracic aorta is normal in caliber. Moderate atherosclerotic plaque of the thoracic aorta. Four-vessel coronary artery calcifications. Mediastinum/Nodes: No enlarged mediastinal, hilar, or axillary lymph nodes. Thyroid gland, trachea, and esophagus demonstrate no significant findings. Lungs/Pleura: Centrilobular at least moderate emphysematous changes. Mild to moderate paraseptal and stones changes. Diffuse bronchial wall thickening. Right lower lobe 0.8 x 0.5 cm pulmonary nodule with surrounding ground-glass airspace opacity. Interval decrease in cluster of nodule and airspace opacity of right upper lobe measuring 1.1 x 0.4 cm (from 1.9 x 1.6 cm) (5:33). Redemonstration  of lingular and left lower lobe subpleural nodular like airspace opacities that are chronic and likely represent scarring. Interval increase in patchy airspace opacities of the dependent right lower lobe with interlobular septal wall thickening. No pulmonary mass. No pleural effusion. No pneumothorax. Upper Abdomen: No acute abnormality. Splenule noted. Severe atherosclerotic plaque. Musculoskeletal: No chest wall abnormality. No suspicious lytic or blastic osseous lesions. No acute displaced fracture. Cervical surgical hardware. Exaggerated kyphotic curvature  of the thoracic spine. Review of the MIP images confirms the above findings. IMPRESSION: 1. No pulmonary nodule. 2. The main pulmonary artery measures at the upper limits of normal-correlate for pulmonary hypertension. 3. Right lower lobe 0.8 x 0.5 cm pulmonary nodule with surrounding ground-glass airspace opacity may be residual infection from CT angio chest 05/14/2023. Interval decrease in cluster of nodule and airspace opacity of right upper lobe measuring 1.1 x 0.4 cm likely representing resolving infection. Recommend follow-up CT in 3 months to evaluate for complete resolution. 4. Interval increase in patchy airspace opacities of the dependent right lower lobe with interlobular septal wall thickening suggestive of infection/inflammation. 5. Small airway disease in the setting of emphysema. 6. Aortic Atherosclerosis (ICD10-I70.0) and Emphysema (ICD10-J43.9). Electronically Signed   By: Morgane  Naveau M.D.   On: 06/08/2023 20:55   DG Chest Port 1 View Result Date: 06/08/2023 CLINICAL DATA:  Questionable sepsis - evaluate for abnormality EXAM: PORTABLE CHEST 1 VIEW COMPARISON:  CT angio chest 06/08/2023, chest x-ray 05/19/2023, CT angio chest 05/15/2023 FINDINGS: The heart and mediastinal contours are within normal limits. Atherosclerotic plaque. Chronic coarsened interstitial markings. No focal consolidation. No pulmonary edema. No pleural effusion. No pneumothorax. No acute osseous abnormality. IMPRESSION: 1. No active disease. 2. Aortic Atherosclerosis (ICD10-I70.0) and Emphysema (ICD10-J43.9). Electronically Signed   By: Morgane  Naveau M.D.   On: 06/08/2023 20:32        Scheduled Meds:  amLODipine   10 mg Oral Daily   arformoterol   15 mcg Nebulization BID   aspirin  EC  81 mg Oral Daily   atorvastatin   80 mg Oral Daily   [START ON 06/11/2023] azithromycin   500 mg Oral Daily   citalopram   20 mg Oral Daily   cyanocobalamin   1,000 mcg Oral Daily   enoxaparin  (LOVENOX ) injection  40 mg Subcutaneous  Q24H   feeding supplement  237 mL Oral BID BM   ferrous sulfate   325 mg Oral QAC breakfast   guaiFENesin   10 mL Oral Q4H   ipratropium-albuterol   3 mL Nebulization QID   isosorbide  mononitrate  15 mg Oral Daily   losartan   100 mg Oral Daily   methylPREDNISolone  (SOLU-MEDROL ) injection  40 mg Intravenous Q12H   metoprolol  succinate  25 mg Oral Daily   multivitamin with minerals  1 tablet Oral Daily   ticagrelor   90 mg Oral BID   Continuous Infusions:  cefTRIAXone  (ROCEPHIN )  IV Stopped (06/10/23 0015)     LOS: 1 day      Calvin KATHEE Robson, MD [facility]   If 7PM-7AM, please contact night-coverage  06/10/2023, 12:20 PM

## 2023-06-10 NOTE — Progress Notes (Signed)
 Mobility Specialist - Progress Note  Post-mobility: HR 105, SPO2 94   06/10/23 1126  Mobility  Activity Transferred to/from BSC;Stood at bedside  Level of Assistance Standby assist, set-up cues, supervision of patient - no hands on  Assistive Device BSC  Distance Ambulated (ft) 4 ft  Activity Response Tolerated well  Mobility visit 1 Mobility  Mobility Specialist Start Time (ACUTE ONLY) 1024  Mobility Specialist Stop Time (ACUTE ONLY) 1038  Mobility Specialist Time Calculation (min) (ACUTE ONLY) 14 min   Pt simi fowler upon entry, utilizing [facility]. Pt completed bed indep, STS and transferred to/from the [facility] via SPT with SBA. Pt notable SOB upon return to bed, O2 <90% HR 105. Pt returned simi fowler, left with alarm set and needs within reach.  America Silvan Mobility Specialist 06/10/23 11:34 AM

## 2023-06-10 NOTE — Consult Note (Signed)
 PHARMACIST - PHYSICIAN COMMUNICATION  DR: Calvin Robson, MD  CONCERNING: IV to Oral Route Change Policy  RECOMMENDATION: This patient is receiving azithromycin  by the intravenous route.  Based on criteria approved by the Pharmacy and Therapeutics Committee, the antibiotic(s) is/are being converted to the equivalent oral dose form(s).   DESCRIPTION: These criteria include: Patient being treated for a respiratory tract infection, urinary tract infection, cellulitis or clostridium difficile associated diarrhea if on metronidazole The patient is not neutropenic and does not exhibit a GI malabsorption state The patient is eating (either orally or via tube) and/or has been taking other orally administered medications for a least 24 hours The patient is improving clinically and has a Tmax < 100.5  If you have questions about this conversion, please contact the Pharmacy Department  []   [phone] )  Terri Burns [x]   [phone] )  [facility] []   [phone] )  Terri Burns []   [phone] )  [facility] []   [phone] )  Terri Burns   Terri Burns, PharmD Clinical Pharmacist 06/10/2023 10:53 AM

## 2023-06-10 NOTE — TOC Initial Note (Signed)
 Transition of Care [facility]) - Initial/Assessment Note    Patient Details  Name: Terri Burns MRN: [id number] Date of Birth: 08/24/1950  Transition of Care [facility]) CM/SW Contact:    Norabelle Kondo E Donte Kary, LCSW Phone Number: 06/10/2023, 3:01 PM  Clinical Narrative:                 Patient is known to [facility] for recent admission and discharge home with [facility]. CSW called and spoke to Lauraine, [facility] Representative. Patient is active with [facility] for PT and OT and they can resume services at time of DC.  Expected Discharge Plan: Home w Home Health Services Barriers to Discharge: Continued Medical Work up   Patient Goals and CMS Choice            Expected Discharge Plan and Services       Living arrangements for the past 2 months: Single Family Home                           HH Arranged: PT, OT HH Agency: [facility] Date [facility] Agency Contacted: 06/10/23   Representative spoke with at [facility] Agency: [name]  Prior Living Arrangements/Services Living arrangements for the past 2 months: Single Family Home Lives with:: Self Patient language and need for interpreter reviewed:: Yes        Need for Family Participation in Patient Care: Yes (Comment) Care giver support system in place?: Yes (comment) Current home services: DME, Home PT Criminal Activity/Legal Involvement Pertinent to Current Situation/Hospitalization: No - Comment as needed  Activities of Daily Living   ADL Screening (condition at time of admission) Independently performs ADLs?: Yes (appropriate for developmental age) Does the patient have a NEW difficulty with bathing/dressing/toileting/self-feeding that is expected to last >3 days?: No Does the patient have a NEW difficulty with getting in/out of bed, walking, or climbing stairs that is expected to last >3 days?: No Does the patient have a NEW difficulty with communication that is expected to last >3 days?: No Is the patient deaf or have  difficulty hearing?: No Does the patient have difficulty seeing, even when wearing glasses/contacts?: No Does the patient have difficulty concentrating, remembering, or making decisions?: No  Permission Sought/Granted                  Emotional Assessment         Alcohol / Substance Use: Not Applicable Psych Involvement: No (comment)  Admission diagnosis:  Pulmonary nodule [R91.1] COPD exacerbation (HCC) [J44.1] CAP (community acquired pneumonia) [J18.9] COPD with acute exacerbation (HCC) [J44.1] Acute hypoxic respiratory failure (HCC) [J96.01] Patient Active Problem List   Diagnosis Date Noted   Right lower lobe pneumonia 06/09/2023   Acute respiratory failure with hypoxia (HCC) 06/09/2023   Coronary artery disease 06/09/2023   Pulmonary nodule 06/09/2023   Acute hypoxic respiratory failure (HCC) 06/09/2023   Community acquired pneumonia of right lower lobe of lung 06/09/2023   CAP (community acquired pneumonia) 06/09/2023   COPD exacerbation (HCC) 06/08/2023   Low vitamin B12 level 05/20/2023   Microcytic anemia 05/20/2023   Iron  deficiency anemia 05/19/2023   Acute exacerbation of bronchiectasis (HCC) 05/17/2023   Pneumonia of right upper lobe due to Streptococcus pneumoniae (HCC) 05/17/2023   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) 01/05/2022   Anxiety and depression 01/05/2022   Dyslipidemia 01/05/2022   Coronary artery disease involving native coronary artery of native heart without angina pectoris  01/05/2022   GERD without esophagitis 01/05/2022   Acute hypoxemic respiratory failure (HCC) 01/05/2022   Centrilobular emphysema (HCC) 07/13/2021   Nicotine  dependence, cigarettes, uncomplicated 04/13/2021   History of non-ST elevation myocardial infarction (NSTEMI)    Depression 09/13/2020   HLD (hyperlipidemia) 09/13/2020   Atherosclerosis of aorta (HCC) 03/04/2018   Coronary artery calcification of native artery 03/04/2018   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 [place] [address] Phone: [phone] Fax: [phone]     Social Drivers of Health (SDOH) Social History: SDOH Screenings   Food Insecurity: No Food Insecurity (06/09/2023)  Housing: Low Risk  (06/09/2023)  Transportation Needs: No Transportation Needs (06/09/2023)  Utilities: Not At Risk (06/09/2023)  Alcohol Screen: Low Risk  (07/13/2021)  Depression (PHQ2-9): Low Risk  (05/11/2022)  Financial Resource Strain: Low Risk  (07/13/2021)  Physical Activity: Insufficiently Active (07/13/2021)  Social Connections: Socially Isolated (06/09/2023)  Stress: No Stress Concern Present (07/13/2021)  Tobacco Use: High Risk (06/10/2023)   SDOH Interventions:     Readmission Risk Interventions     No data to display

## 2023-06-11 DIAGNOSIS — J189 Pneumonia, unspecified organism: Secondary | ICD-10-CM | POA: Diagnosis not present

## 2023-06-11 LAB — RESPIRATORY PANEL BY PCR

## 2023-06-11 MED ORDER — HYDROCODONE-ACETAMINOPHEN 10-325 MG PO TABS
1.0000 | ORAL_TABLET | ORAL | Status: DC | PRN
  Administered 2023-06-11 – 2023-06-20 (×46): 1 via ORAL
  Filled 2023-06-11 (×47): qty 1

## 2023-06-11 MED ORDER — METHYLPREDNISOLONE SODIUM SUCC 40 MG IJ SOLR
40.0000 mg | INTRAMUSCULAR | Status: DC
  Administered 2023-06-12: 40 mg via INTRAVENOUS
  Filled 2023-06-11: qty 1

## 2023-06-11 MED ORDER — GUAIFENESIN ER 600 MG PO TB12
600.0000 mg | ORAL_TABLET | Freq: Two times a day (BID) | ORAL | Status: DC
  Filled 2023-06-11 (×2): qty 1

## 2023-06-11 MED ORDER — IPRATROPIUM-ALBUTEROL 0.5-2.5 (3) MG/3ML IN SOLN
3.0000 mL | Freq: Three times a day (TID) | RESPIRATORY_TRACT | Status: DC
  Administered 2023-06-11 – 2023-06-16 (×15): 3 mL via RESPIRATORY_TRACT
  Filled 2023-06-11 (×16): qty 3

## 2023-06-11 NOTE — Plan of Care (Signed)

## 2023-06-11 NOTE — Progress Notes (Signed)
 PROGRESS NOTE    Terri Burns  FMW:[id number] DOB: 21-Mar-1951 DOA: 06/08/2023 PCP: Sampson Ethridge LABOR, MD    Brief Narrative:  73 y.o. female with medical history significant for COPD, GERD, hypertension, dyslipidemia and depression, who presented to the emergency room with a [place] of worsening dyspnea with associated cough productive of creamy sputum as well as wheezing since yesterday with no fever or chills.  He denied any chest pain or palpitations.  No dysuria, oliguria or hematuria or flank pain.  No nausea or vomiting or abdominal pain.  The patient was initially in significant respiratory distress she was placed on CPAP and was later on tapered to 3 L of O2 by nasal cannula.    Assessment & Plan:   Principal Problem:   CAP (community acquired pneumonia) Active Problems:   COPD exacerbation (HCC)   Right lower lobe pneumonia   Acute respiratory failure with hypoxia (HCC)   Pulmonary nodule   GERD without esophagitis   Benign essential HTN   Depression   Dyslipidemia   Coronary artery disease   Acute hypoxic respiratory failure (HCC)   Community acquired pneumonia of right lower lobe of lung  COPD exacerbation Community-acquired pneumonia Likely that infectious process exacerbated COPD.  Patient is hemodynamically stable and appears to be slowly improving. Plan: Continue IV Solu-Medrol , dose decreased to 40 mg once daily Continue Rocephin  and azithromycin  Scheduled and as needed bronchodilators Wean oxygen as tolerated  Acute hypoxic respiratory failure Secondary to above Management as above Wean oxygen as tolerated  Pulmonary nodules Outpatient pulmonary follow-up  GERD PPI  CAD PTA aspirin  and Brilinta  PTA Toprol  and Imdur   Hyperlipidemia PTA statin  Depression PTA Celexa   Hypertension BP controlled  Functional decline Therapy evaluations  DVT prophylaxis: SQ Lovenox  Code Status: Full Family Communication: Left VM for son Riva  [phone] on 1/4 Disposition Plan: Status is: Inpatient Remains inpatient appropriate because: COPD exacerbation   Level of care: Telemetry Medical  Consultants:  None  Procedures:  None  Antimicrobials: Rocephin  Azithromycin    Subjective: Seen and examined.  Appears to be slowly improving.  Main complaint is pain  Objective: Vitals:   06/11/23 0747 06/11/23 0809 06/11/23 1056 06/11/23 1421  BP:  102/82 116/70   Pulse:  88 92   Resp:  18    Temp:  98.3 F (36.8 C)    TempSrc:  Oral    SpO2: 99% 100%  97%  Weight:      Height:        Intake/Output Summary (Last 24 hours) at 06/11/2023 1506 Last data filed at 06/10/2023 1800 Gross per 24 hour  Intake 360 ml  Output --  Net 360 ml   Filed Weights   06/08/23 1801 06/09/23 2100  Weight: 56.2 kg 54.7 kg    Examination:  General exam: Appears fatigued and chronically ill Respiratory system: Diminished breath sounds bilaterally.  No wheeze.  Normal work of breathing.  3 L Cardiovascular system: S1-S2, RRR, no murmurs, no pedal edema Gastrointestinal system: Soft, NT/ND, normal bowel sounds Central nervous system: Alert and oriented. No focal neurological deficits. Extremities: Symmetrically decreased power Skin: No rashes, lesions or ulcers Psychiatry: Judgement and insight appear normal. Mood & affect appropriate.     Data Reviewed: I have personally reviewed following labs and imaging studies  CBC: Recent Labs  Lab 06/08/23 1820 06/09/23 0350  WBC 7.4 4.3  NEUTROABS 5.4  --   HGB 8.6* 8.4*  HCT 28.2* 27.5*  MCV 87.0 86.8  PLT  219 210   Basic Metabolic Panel: Recent Labs  Lab 06/08/23 1820 06/09/23 0350  NA 132* 133*  K 5.2* 4.8  CL 98 103  CO2 23 22  GLUCOSE 101* 191*  BUN 19 14  CREATININE 0.83 0.64  CALCIUM  8.3* 7.9*   GFR: Estimated Creatinine Clearance: 47.2 mL/min (by C-G formula based on SCr of 0.64 mg/dL). Liver Function Tests: Recent Labs  Lab 06/08/23 1820  AST 20  ALT 22   ALKPHOS 50  BILITOT 0.5  PROT 6.1*  ALBUMIN 3.4*   No results for input(s): LIPASE, AMYLASE in the last 168 hours. No results for input(s): AMMONIA in the last 168 hours. Coagulation Profile: Recent Labs  Lab 06/08/23 1820  INR 1.0   Cardiac Enzymes: No results for input(s): CKTOTAL, CKMB, CKMBINDEX, TROPONINI in the last 168 hours. BNP (last 3 results) No results for input(s): PROBNP in the last 8760 hours. HbA1C: No results for input(s): HGBA1C in the last 72 hours. CBG: No results for input(s): GLUCAP in the last 168 hours. Lipid Profile: No results for input(s): CHOL, HDL, LDLCALC, TRIG, CHOLHDL, LDLDIRECT in the last 72 hours. Thyroid Function Tests: No results for input(s): TSH, T4TOTAL, FREET4, T3FREE, THYROIDAB in the last 72 hours. Anemia Panel: No results for input(s): VITAMINB12, FOLATE, FERRITIN, TIBC, IRON , RETICCTPCT in the last 72 hours. Sepsis Labs: Recent Labs  Lab 06/08/23 1821 06/08/23 2053  LATICACIDVEN 0.7 0.9    Recent Results (from the past 240 hours)  Resp panel by RT-PCR (RSV, Flu A&B, Covid) Anterior Nasal Swab     Status: None   Collection Time: 06/08/23  6:28 PM   Specimen: Anterior Nasal Swab  Result Value Ref Range Status   SARS Coronavirus 2 by RT PCR NEGATIVE NEGATIVE Final    Comment: (NOTE) SARS-CoV-2 target nucleic acids are NOT DETECTED.  The SARS-CoV-2 RNA is generally detectable in upper respiratory specimens during the acute phase of infection. The lowest concentration of SARS-CoV-2 viral copies this assay can detect is 138 copies/mL. A negative result does not preclude SARS-Cov-2 infection and should not be used as the sole basis for treatment or other patient management decisions. A negative result may occur with  improper specimen collection/handling, submission of specimen other than nasopharyngeal swab, presence of viral mutation(s) within the areas targeted by this  assay, and inadequate number of viral copies(<138 copies/mL). A negative result must be combined with clinical observations, patient history, and epidemiological information. The expected result is Negative.  Fact Sheet for Patients:  bloggercourse.com  Fact Sheet for Healthcare Providers:  seriousbroker.it  This test is no t yet approved or cleared by the [place]  FDA and  has been authorized for detection and/or diagnosis of SARS-CoV-2 by FDA under an Emergency Use Authorization (EUA). This EUA will remain  in effect (meaning this test can be used) for the duration of the COVID-19 declaration under Section 564(b)(1) of the Act, 21 U.S.C.section 360bbb-3(b)(1), unless the authorization is terminated  or revoked sooner.       Influenza A by PCR NEGATIVE NEGATIVE Final   Influenza B by PCR NEGATIVE NEGATIVE Final    Comment: (NOTE) The Xpert Xpress SARS-CoV-2/FLU/RSV plus assay is intended as an aid in the diagnosis of influenza from Nasopharyngeal swab specimens and should not be used as a sole basis for treatment. Nasal washings and aspirates are unacceptable for Xpert Xpress SARS-CoV-2/FLU/RSV testing.  Fact Sheet for Patients: bloggercourse.com  Fact Sheet for Healthcare Providers: seriousbroker.it  This test is not yet  approved or cleared by the [place]  FDA and has been authorized for detection and/or diagnosis of SARS-CoV-2 by FDA under an Emergency Use Authorization (EUA). This EUA will remain in effect (meaning this test can be used) for the duration of the COVID-19 declaration under Section 564(b)(1) of the Act, 21 U.S.C. section 360bbb-3(b)(1), unless the authorization is terminated or revoked.     Resp Syncytial Virus by PCR NEGATIVE NEGATIVE Final    Comment: (NOTE) Fact Sheet for Patients: bloggercourse.com  Fact Sheet for  Healthcare Providers: seriousbroker.it  This test is not yet approved or cleared by the [place]  FDA and has been authorized for detection and/or diagnosis of SARS-CoV-2 by FDA under an Emergency Use Authorization (EUA). This EUA will remain in effect (meaning this test can be used) for the duration of the COVID-19 declaration under Section 564(b)(1) of the Act, 21 U.S.C. section 360bbb-3(b)(1), unless the authorization is terminated or revoked.  Performed at [facility], [address] [place], [place] [address]   Blood Culture (routine x 2)     Status: None (Preliminary result)   Collection Time: 06/08/23  6:30 PM   Specimen: BLOOD  Result Value Ref Range Status   Specimen Description BLOOD LEFT ASSIST CONTROL  Final   Special Requests   Final    BOTTLES DRAWN AEROBIC AND ANAEROBIC Blood Culture adequate volume   Culture   Final    NO GROWTH 3 DAYS Performed at [facility], [address], [place], [place] [address]    Report Status PENDING  Incomplete  Blood Culture (routine x 2)     Status: None (Preliminary result)   Collection Time: 06/08/23  6:36 PM   Specimen: BLOOD  Result Value Ref Range Status   Specimen Description BLOOD RIGHT ANTECUBITAL  Final   Special Requests BOTTLES DRAWN AEROBIC AND ANAEROBIC BCAV  Final   Culture   Final    NO GROWTH 3 DAYS Performed at [facility], [address], [place], [place] [address]    Report Status PENDING  Incomplete  Respiratory (~20 pathogens) panel by PCR     Status: None   Collection Time: 06/10/23 10:30 PM   Specimen: Nasopharyngeal Swab; Respiratory  Result Value Ref Range Status   Adenovirus NOT DETECTED NOT DETECTED Final   Coronavirus 229E NOT DETECTED NOT DETECTED Final    Comment: (NOTE) The Coronavirus on the Respiratory Panel, DOES NOT test for the novel  Coronavirus (2019 nCoV)    Coronavirus HKU1 NOT DETECTED NOT DETECTED Final   Coronavirus  NL63 NOT DETECTED NOT DETECTED Final   Coronavirus OC43 NOT DETECTED NOT DETECTED Final   Metapneumovirus NOT DETECTED NOT DETECTED Final   Rhinovirus / Enterovirus NOT DETECTED NOT DETECTED Final   Influenza A NOT DETECTED NOT DETECTED Final   Influenza B NOT DETECTED NOT DETECTED Final   Parainfluenza Virus 1 NOT DETECTED NOT DETECTED Final   Parainfluenza Virus 2 NOT DETECTED NOT DETECTED Final   Parainfluenza Virus 3 NOT DETECTED NOT DETECTED Final   Parainfluenza Virus 4 NOT DETECTED NOT DETECTED Final   Respiratory Syncytial Virus NOT DETECTED NOT DETECTED Final   Bordetella pertussis NOT DETECTED NOT DETECTED Final   Bordetella Parapertussis NOT DETECTED NOT DETECTED Final   Chlamydophila pneumoniae NOT DETECTED NOT DETECTED Final   Mycoplasma pneumoniae NOT DETECTED NOT DETECTED Final    Comment: Performed at [facility], [address] [place], [place] [address]  Radiology Studies: No results found.       Scheduled Meds:  amLODipine   10 mg Oral Daily   arformoterol   15 mcg Nebulization BID   aspirin  EC  81 mg Oral Daily   atorvastatin   80 mg Oral Daily   azithromycin   500 mg Oral Daily   citalopram   20 mg Oral Daily   cyanocobalamin   1,000 mcg Oral Daily   enoxaparin  (LOVENOX ) injection  40 mg Subcutaneous Q24H   feeding supplement  237 mL Oral BID BM   ferrous sulfate   325 mg Oral QAC breakfast   guaiFENesin   600 mg Oral BID   guaiFENesin   10 mL Oral Q4H   ipratropium-albuterol   3 mL Nebulization TID   isosorbide  mononitrate  15 mg Oral Daily   ketorolac   15 mg Intravenous Q8H   lidocaine   1 patch Transdermal Q24H   losartan   100 mg Oral Daily   methylPREDNISolone  (SOLU-MEDROL ) injection  40 mg Intravenous Q12H   metoprolol  succinate  25 mg Oral Daily   multivitamin with minerals  1 tablet Oral Daily   ticagrelor   90 mg Oral BID   Continuous Infusions:  cefTRIAXone  (ROCEPHIN )  IV 2 g (06/10/23 2218)     LOS: 2 days      Calvin KATHEE Robson, MD [facility]   If 7PM-7AM, please contact night-coverage  06/11/2023, 3:06 PM

## 2023-06-12 DIAGNOSIS — J189 Pneumonia, unspecified organism: Secondary | ICD-10-CM | POA: Diagnosis not present

## 2023-06-12 LAB — BASIC METABOLIC PANEL
Anion gap: 10 (ref 5–15)
BUN: 31 mg/dL — ABNORMAL HIGH (ref 8–23)
CO2: 26 mmol/L (ref 22–32)
Calcium: 8.4 mg/dL — ABNORMAL LOW (ref 8.9–10.3)
Chloride: 99 mmol/L (ref 98–111)
Creatinine, Ser: 0.73 mg/dL (ref 0.44–1.00)
GFR, Estimated: >60 mL/min (ref >60)
Glucose, Bld: 96 mg/dL (ref 70–99)
Potassium: 4.7 mmol/L (ref 3.5–5.1)
Sodium: 135 mmol/L (ref 135–145)

## 2023-06-12 LAB — VITAMIN D 25 HYDROXY (VIT D DEFICIENCY, FRACTURES): Vit D, 25-Hydroxy: 19.14 ng/mL — ABNORMAL LOW (ref 30–100)

## 2023-06-12 LAB — GLUCOSE, CAPILLARY
Glucose-Capillary: 168 mg/dL — ABNORMAL HIGH (ref 70–99)
Glucose-Capillary: 168 mg/dL — ABNORMAL HIGH (ref 70–99)

## 2023-06-12 LAB — CBC
HCT: 28.1 % — ABNORMAL LOW (ref 36.0–46.0)
Hemoglobin: 8.7 g/dL — ABNORMAL LOW (ref 12.0–15.0)
MCH: 26.4 pg (ref 26.0–34.0)
MCHC: 31 g/dL (ref 30.0–36.0)
MCV: 85.2 fL (ref 80.0–100.0)
Platelets: 283 10*3/uL (ref 150–400)
RBC: 3.3 MIL/uL — ABNORMAL LOW (ref 3.87–5.11)
RDW: 23.8 % — ABNORMAL HIGH (ref 11.5–15.5)
WBC: 10.1 10*3/uL (ref 4.0–10.5)
nRBC: 0 % (ref 0.0–0.2)

## 2023-06-12 LAB — MAGNESIUM: Magnesium: 2.3 mg/dL (ref 1.7–2.4)

## 2023-06-12 LAB — PHOSPHORUS: Phosphorus: 3.7 mg/dL (ref 2.5–4.6)

## 2023-06-12 MED ORDER — PREDNISONE 20 MG PO TABS
40.0000 mg | ORAL_TABLET | Freq: Every day | ORAL | Status: AC
  Administered 2023-06-13 – 2023-06-15 (×3): 40 mg via ORAL
  Filled 2023-06-12 (×3): qty 2

## 2023-06-12 MED ORDER — PREDNISONE 20 MG PO TABS
30.0000 mg | ORAL_TABLET | Freq: Every day | ORAL | Status: DC
  Administered 2023-06-16: 30 mg via ORAL
  Filled 2023-06-12: qty 1

## 2023-06-12 MED ORDER — GUAIFENESIN 100 MG/5ML PO LIQD
20.0000 mL | Freq: Three times a day (TID) | ORAL | Status: DC
  Administered 2023-06-12 – 2023-06-20 (×25): 20 mL via ORAL
  Filled 2023-06-12 (×26): qty 20

## 2023-06-12 MED ORDER — PREDNISONE 10 MG PO TABS: 10.0000 mg | ORAL_TABLET | Freq: Every day | ORAL | Status: DC

## 2023-06-12 MED ORDER — PANTOPRAZOLE SODIUM 40 MG PO TBEC
40.0000 mg | DELAYED_RELEASE_TABLET | Freq: Every day | ORAL | Status: DC
  Administered 2023-06-12 – 2023-06-20 (×9): 40 mg via ORAL
  Filled 2023-06-12 (×9): qty 1

## 2023-06-12 MED ORDER — PREDNISONE 20 MG PO TABS: 20.0000 mg | ORAL_TABLET | Freq: Every day | ORAL | Status: DC

## 2023-06-12 NOTE — Progress Notes (Signed)
 Physical Therapy Treatment Patient Details Name: Terri Burns MRN: [id number] DOB: April 23, 1951 Today's Date: 06/12/2023   History of Present Illness Pt is a 73 y/o F admitted on 06/08/23 after presenting with c/o worsening dyspnea with cough, sputum production, & wheezing. Pt is being treated for COPD exacerbation, RLL PNA. PMH: COPD, GERD, HTN, dyslipidemia, depression    PT Comments  Pt was pleasant and motivated to participate during the session and put forth good effort throughout. Pt required substantial effort with all below functional tasks but no physical assist.  Pt required frequent therapeutic rest breaks to recover from SOB with exertion with cues given for PLB.  Pt found on 2LO2/min with SpO2 in the upper 90s and with HR WNL.  Pt's vitals measured frequently during the session with SpO2 and HR remaining WNL throughout including during ambulation where she was only able to amb a max of 4 feet during multiple bouts before needing to return to sitting to recover.  Pt will benefit from continued PT services upon discharge to safely address deficits listed in patient problem list for decreased caregiver assistance and eventual return to PLOF.      If plan is discharge home, recommend the following: A little help with walking and/or transfers;A little help with bathing/dressing/bathroom;Assist for transportation;Help with stairs or ramp for entrance;Assistance with cooking/housework   Can travel by private vehicle     Yes  Equipment Recommendations  None recommended by PT    Recommendations for Other Services       Precautions / Restrictions Precautions Precautions: Fall Restrictions Weight Bearing Restrictions Per Provider Order: No     Mobility  Bed Mobility Overal bed mobility: Needs Assistance Bed Mobility: Supine to Sit           General bed mobility comments: Mod extra time and effort and use of bed rail    Transfers Overall transfer level: Needs  assistance Equipment used: Rolling walker (2 wheels) Transfers: Sit to/from Stand Sit to Stand: Contact guard assist           General transfer comment: Min verbal and tactile cues for hand placement    Ambulation/Gait Ambulation/Gait assistance: Contact guard assist Gait Distance (Feet): 4 Feet x 3 Assistive device: Rolling walker (2 wheels) Gait Pattern/deviations: Decreased step length - right, Decreased step length - left, Trunk flexed, Step-through pattern Gait velocity: decreased     General Gait Details: Slow, effortful steps with short B step length but generally steady without overt LOB   Stairs             Wheelchair Mobility     Tilt Bed    Modified Rankin (Stroke Patients Only)       Balance Overall balance assessment: Needs assistance Sitting-balance support: Feet supported, Single extremity supported Sitting balance-Leahy Scale: Good     Standing balance support: Bilateral upper extremity supported, During functional activity Standing balance-Leahy Scale: Fair                              Cognition Arousal: Alert Behavior During Therapy: WFL for tasks assessed/performed Overall Cognitive Status: Within Functional Limits for tasks assessed                                          Exercises Other Exercises Other Exercises: Educated on pursed lip breathing for SOB  and general principles of energy conservation    General Comments        Pertinent Vitals/Pain Pain Assessment Pain Assessment: 0-10 Pain Score: 6  Pain Location: Chronic back pain per patient Pain Descriptors / Indicators: Sore Pain Intervention(s): Repositioned, Monitored during session, Other (comment) (Pt declined pain meds)    Home Living                          Prior Function            PT Goals (current goals can now be found in the care plan section) Progress towards PT goals: Progressing toward goals     Frequency    Min 1X/week      PT Plan      Co-evaluation              AM-PAC PT 6 Clicks Mobility   Outcome Measure  Help needed turning from your back to your side while in a flat bed without using bedrails?: None Help needed moving from lying on your back to sitting on the side of a flat bed without using bedrails?: None Help needed moving to and from a bed to a chair (including a wheelchair)?: A Little Help needed standing up from a chair using your arms (e.g., wheelchair or bedside chair)?: A Little Help needed to walk in hospital room?: A Lot Help needed climbing 3-5 steps with a railing? : A Lot 6 Click Score: 18    End of Session Equipment Utilized During Treatment: Oxygen;Gait belt Activity Tolerance: Patient tolerated treatment well Patient left: in chair;with call bell/phone within reach;with chair alarm set Nurse Communication: Mobility status PT Visit Diagnosis: Muscle weakness (generalized) (M62.81);Unsteadiness on feet (R26.81);Difficulty in walking, not elsewhere classified (R26.2)     Time: 8965-8896 PT Time Calculation (min) (ACUTE ONLY): 29 min  Charges:    $Therapeutic Activity: 23-37 mins PT General Charges $$ ACUTE PT VISIT: 1 Visit                     D. Scott Jayel Inks PT, DPT 06/12/23, 11:39 AM

## 2023-06-12 NOTE — Care Management Important Message (Signed)
 Important Message  Patient Details  Name: Terri Burns MRN: [id number] Date of Birth: Sep 28, 1950   Important Message Given:        Cristela Blue, CMA 06/12/2023, 11:31 AM

## 2023-06-12 NOTE — TOC Progression Note (Signed)
 Transition of Care [facility]) - Progression Note    Patient Details  Name: Terri Burns MRN: [id number] Date of Birth: 1951/02/17  [facility]) CM/SW Contact  Lauraine JAYSON Carpen, LCSW Phone Number: 06/12/2023, 3:43 PM  Clinical Narrative:  PT changed recommendation to SNF. Patient prefers to return home but wants to see how she does tomorrow. She gave CSW permission to send out SNF referral to see what her options are. SDOH flag for social isolation. CSW added resources to AVS.   Expected Discharge Plan: Home w Home Health Services Barriers to Discharge: Continued Medical Work up  Expected Discharge Plan and Services       Living arrangements for the past 2 months: Single Family Home                           HH Arranged: PT, OT [facility] Agency: [facility] Date [facility] Agency Contacted: 06/10/23   Representative spoke with at [facility] Agency: [name]   Social Determinants of Health (SDOH) Interventions SDOH Screenings   Food Insecurity: No Food Insecurity (06/09/2023)  Housing: Low Risk  (06/09/2023)  Transportation Needs: No Transportation Needs (06/09/2023)  Utilities: Not At Risk (06/09/2023)  Alcohol Screen: Low Risk  (07/13/2021)  Depression (PHQ2-9): Low Risk  (05/11/2022)  Financial Resource Strain: Low Risk  (07/13/2021)  Physical Activity: Insufficiently Active (07/13/2021)  Social Connections: Socially Isolated (06/09/2023)  Stress: No Stress Concern Present (07/13/2021)  Tobacco Use: High Risk (06/10/2023)    Readmission Risk Interventions     No data to display

## 2023-06-12 NOTE — Plan of Care (Signed)

## 2023-06-12 NOTE — Progress Notes (Signed)
 PROGRESS NOTE    Terri Burns  FMW:[id number] DOB: Sep 30, 1950 DOA: 06/08/2023 PCP: Sampson Ethridge LABOR, MD    Brief Narrative:  73 y.o. female with medical history significant for COPD, GERD, hypertension, dyslipidemia and depression, who presented to the emergency room with a Kalisetti of worsening dyspnea with associated cough productive of creamy sputum as well as wheezing since yesterday with no fever or chills.  He denied any chest pain or palpitations.  No dysuria, oliguria or hematuria or flank pain.  No nausea or vomiting or abdominal pain.  The patient was initially in significant respiratory distress she was placed on CPAP and was later on tapered to 3 L of O2 by nasal cannula.    Assessment & Plan:   Principal Problem:   CAP (community acquired pneumonia) Active Problems:   COPD exacerbation (HCC)   Right lower lobe pneumonia   Acute respiratory failure with hypoxia (HCC)   Pulmonary nodule   GERD without esophagitis   Benign essential HTN   Depression   Dyslipidemia   Coronary artery disease   Acute hypoxic respiratory failure (HCC)   Community acquired pneumonia of right lower lobe of lung  # COPD exacerbation # Community-acquired pneumonia Likely that infectious process exacerbated COPD.  Patient is hemodynamically stable and appears to be slowly improving. Plan: S/p IV Solu-Medrol  125 x 1 dose, BID x 2 days, daily x 2 days  Started prednisone  tapering dose from tomorrow a.m. 1/7 Continue Rocephin  and azithromycin  Scheduled and as needed bronchodilators Wean oxygen as tolerated  # Acute hypoxic respiratory failure Secondary to above Management as above Wean oxygen as tolerated  # Pulmonary nodules Outpatient pulmonary follow-up  # GERD PPI  # CAD PTA aspirin  and Brilinta  PTA Toprol  and Imdur   # Hyperlipidemia PTA statin  # Depression PTA Celexa   # Hypertension BP controlled  # Functional decline Therapy evaluations  # Iron  deficiency  anemia, transferrin saturation 6%.  Continue oral iron  supplement. # Vitamin B12 level 206, goal >400.  Continue B12 supplement.  Follow with PCP to repeat labs after 3 to 6 months.   DVT prophylaxis: SQ Lovenox  Code Status: Full Family Communication: Left VM for son Riva [phone] on 1/4 Disposition Plan: Status is: Inpatient Remains inpatient appropriate because: COPD exacerbation   Level of care: Telemetry Medical  Consultants:  None  Procedures:  None  Antimicrobials: Rocephin  Azithromycin    Subjective: No significant events overnight, patient wears oxygen 2 L at baseline Complaining of mild productive cough, overall feels improvement.  Patient is requesting for PPI home dose.   Objective: Vitals:   06/11/23 1937 06/11/23 2107 06/12/23 0356 06/12/23 0755  BP: (!) 157/104 (!) 150/94 133/80 118/81  Pulse: (!) 101 100 92 79  Resp: 20  20   Temp: 98.5 F (36.9 C)  98.8 F (37.1 C) 98.6 F (37 C)  TempSrc: Oral   Oral  SpO2: 100% 100% 100% 100%  Weight:      Height:       No intake or output data in the 24 hours ending 06/12/23 1624  Filed Weights   06/08/23 1801 06/09/23 2100  Weight: 56.2 kg 54.7 kg    Examination:  General exam: Appears fatigued and chronically ill Respiratory system: Diminished breath sounds bilaterally.  No wheeze.  Normal work of breathing.  3 L Cardiovascular system: S1-S2, RRR, no murmurs, no pedal edema Gastrointestinal system: Soft, NT/ND, normal bowel sounds Central nervous system: Alert and oriented. No focal neurological deficits. Extremities: Symmetrically decreased power  Skin: No rashes, lesions or ulcers Psychiatry: Judgement and insight appear normal. Mood & affect appropriate.     Data Reviewed: I have personally reviewed following labs and imaging studies  CBC: Recent Labs  Lab 06/08/23 1820 06/09/23 0350 06/12/23 1044  WBC 7.4 4.3 10.1  NEUTROABS 5.4  --   --   HGB 8.6* 8.4* 8.7*  HCT 28.2* 27.5* 28.1*   MCV 87.0 86.8 85.2  PLT 219 210 283   Basic Metabolic Panel: Recent Labs  Lab 06/08/23 1820 06/09/23 0350 06/12/23 1044  NA 132* 133* 135  K 5.2* 4.8 4.7  CL 98 103 99  CO2 23 22 26   GLUCOSE 101* 191* 96  BUN 19 14 31*  CREATININE 0.83 0.64 0.73  CALCIUM  8.3* 7.9* 8.4*  MG  --   --  2.3  PHOS  --   --  3.7   GFR: Estimated Creatinine Clearance: 47.2 mL/min (by C-G formula based on SCr of 0.73 mg/dL). Liver Function Tests: Recent Labs  Lab 06/08/23 1820  AST 20  ALT 22  ALKPHOS 50  BILITOT 0.5  PROT 6.1*  ALBUMIN 3.4*   No results for input(s): LIPASE, AMYLASE in the last 168 hours. No results for input(s): AMMONIA in the last 168 hours. Coagulation Profile: Recent Labs  Lab 06/08/23 1820  INR 1.0   Cardiac Enzymes: No results for input(s): CKTOTAL, CKMB, CKMBINDEX, TROPONINI in the last 168 hours. BNP (last 3 results) No results for input(s): PROBNP in the last 8760 hours. HbA1C: No results for input(s): HGBA1C in the last 72 hours. CBG: Recent Labs  Lab 06/10/23 1533 06/10/23 1704  GLUCAP 168* 168*   Lipid Profile: No results for input(s): CHOL, HDL, LDLCALC, TRIG, CHOLHDL, LDLDIRECT in the last 72 hours. Thyroid Function Tests: No results for input(s): TSH, T4TOTAL, FREET4, T3FREE, THYROIDAB in the last 72 hours. Anemia Panel: No results for input(s): VITAMINB12, FOLATE, FERRITIN, TIBC, IRON , RETICCTPCT in the last 72 hours. Sepsis Labs: Recent Labs  Lab 06/08/23 1821 06/08/23 2053  LATICACIDVEN 0.7 0.9    Recent Results (from the past 240 hours)  Resp panel by RT-PCR (RSV, Flu A&B, Covid) Anterior Nasal Swab     Status: None   Collection Time: 06/08/23  6:28 PM   Specimen: Anterior Nasal Swab  Result Value Ref Range Status   SARS Coronavirus 2 by RT PCR NEGATIVE NEGATIVE Final    Comment: (NOTE) SARS-CoV-2 target nucleic acids are NOT DETECTED.  The SARS-CoV-2 RNA is generally  detectable in upper respiratory specimens during the acute phase of infection. The lowest concentration of SARS-CoV-2 viral copies this assay can detect is 138 copies/mL. A negative result does not preclude SARS-Cov-2 infection and should not be used as the sole basis for treatment or other patient management decisions. A negative result may occur with  improper specimen collection/handling, submission of specimen other than nasopharyngeal swab, presence of viral mutation(s) within the areas targeted by this assay, and inadequate number of viral copies(<138 copies/mL). A negative result must be combined with clinical observations, patient history, and epidemiological information. The expected result is Negative.  Fact Sheet for Patients:  bloggercourse.com  Fact Sheet for Healthcare Providers:  seriousbroker.it  This test is no t yet approved or cleared by the [place]  FDA and  has been authorized for detection and/or diagnosis of SARS-CoV-2 by FDA under an Emergency Use Authorization (EUA). This EUA will remain  in effect (meaning this test can be used) for the duration of the COVID-19  declaration under Section 564(b)(1) of the Act, 21 U.S.C.section 360bbb-3(b)(1), unless the authorization is terminated  or revoked sooner.       Influenza A by PCR NEGATIVE NEGATIVE Final   Influenza B by PCR NEGATIVE NEGATIVE Final    Comment: (NOTE) The Xpert Xpress SARS-CoV-2/FLU/RSV plus assay is intended as an aid in the diagnosis of influenza from Nasopharyngeal swab specimens and should not be used as a sole basis for treatment. Nasal washings and aspirates are unacceptable for Xpert Xpress SARS-CoV-2/FLU/RSV testing.  Fact Sheet for Patients: bloggercourse.com  Fact Sheet for Healthcare Providers: seriousbroker.it  This test is not yet approved or cleared by the [place]  FDA  and has been authorized for detection and/or diagnosis of SARS-CoV-2 by FDA under an Emergency Use Authorization (EUA). This EUA will remain in effect (meaning this test can be used) for the duration of the COVID-19 declaration under Section 564(b)(1) of the Act, 21 U.S.C. section 360bbb-3(b)(1), unless the authorization is terminated or revoked.     Resp Syncytial Virus by PCR NEGATIVE NEGATIVE Final    Comment: (NOTE) Fact Sheet for Patients: bloggercourse.com  Fact Sheet for Healthcare Providers: seriousbroker.it  This test is not yet approved or cleared by the [place]  FDA and has been authorized for detection and/or diagnosis of SARS-CoV-2 by FDA under an Emergency Use Authorization (EUA). This EUA will remain in effect (meaning this test can be used) for the duration of the COVID-19 declaration under Section 564(b)(1) of the Act, 21 U.S.C. section 360bbb-3(b)(1), unless the authorization is terminated or revoked.  Performed at [facility], [address] [place], [place] [address]   Blood Culture (routine x 2)     Status: None (Preliminary result)   Collection Time: 06/08/23  6:30 PM   Specimen: BLOOD  Result Value Ref Range Status   Specimen Description BLOOD LEFT ASSIST CONTROL  Final   Special Requests   Final    BOTTLES DRAWN AEROBIC AND ANAEROBIC Blood Culture adequate volume   Culture   Final    NO GROWTH 4 DAYS Performed at [facility], [address] [place], [place] [address]    Report Status PENDING  Incomplete  Blood Culture (routine x 2)     Status: None (Preliminary result)   Collection Time: 06/08/23  6:36 PM   Specimen: BLOOD  Result Value Ref Range Status   Specimen Description BLOOD RIGHT ANTECUBITAL  Final   Special Requests BOTTLES DRAWN AEROBIC AND ANAEROBIC BCAV  Final   Culture   Final    NO GROWTH 4 DAYS Performed at [facility], [address]  [place], [place] [address]    Report Status PENDING  Incomplete  Respiratory (~20 pathogens) panel by PCR     Status: None   Collection Time: 06/10/23 10:30 PM   Specimen: Nasopharyngeal Swab; Respiratory  Result Value Ref Range Status   Adenovirus NOT DETECTED NOT DETECTED Final   Coronavirus 229E NOT DETECTED NOT DETECTED Final    Comment: (NOTE) The Coronavirus on the Respiratory Panel, DOES NOT test for the novel  Coronavirus (2019 nCoV)    Coronavirus HKU1 NOT DETECTED NOT DETECTED Final   Coronavirus NL63 NOT DETECTED NOT DETECTED Final   Coronavirus OC43 NOT DETECTED NOT DETECTED Final   Metapneumovirus NOT DETECTED NOT DETECTED Final   Rhinovirus / Enterovirus NOT DETECTED NOT DETECTED Final   Influenza A NOT DETECTED NOT DETECTED Final   Influenza B NOT DETECTED NOT DETECTED Final   Parainfluenza Virus  1 NOT DETECTED NOT DETECTED Final   Parainfluenza Virus 2 NOT DETECTED NOT DETECTED Final   Parainfluenza Virus 3 NOT DETECTED NOT DETECTED Final   Parainfluenza Virus 4 NOT DETECTED NOT DETECTED Final   Respiratory Syncytial Virus NOT DETECTED NOT DETECTED Final   Bordetella pertussis NOT DETECTED NOT DETECTED Final   Bordetella Parapertussis NOT DETECTED NOT DETECTED Final   Chlamydophila pneumoniae NOT DETECTED NOT DETECTED Final   Mycoplasma pneumoniae NOT DETECTED NOT DETECTED Final    Comment: Performed at [facility], [address] [place], [place] [address]         Radiology Studies: No results found.       Scheduled Meds:  amLODipine   10 mg Oral Daily   arformoterol   15 mcg Nebulization BID   aspirin  EC  81 mg Oral Daily   atorvastatin   80 mg Oral Daily   citalopram   20 mg Oral Daily   cyanocobalamin   1,000 mcg Oral Daily   enoxaparin  (LOVENOX ) injection  40 mg Subcutaneous Q24H   feeding supplement  237 mL Oral BID BM   ferrous sulfate   325 mg Oral QAC breakfast   guaiFENesin   20 mL Oral TID   ipratropium-albuterol   3 mL Nebulization TID    isosorbide  mononitrate  15 mg Oral Daily   ketorolac   15 mg Intravenous Q8H   lidocaine   1 patch Transdermal Q24H   losartan   100 mg Oral Daily   methylPREDNISolone  (SOLU-MEDROL ) injection  40 mg Intravenous Q24H   metoprolol  succinate  25 mg Oral Daily   multivitamin with minerals  1 tablet Oral Daily   ticagrelor   90 mg Oral BID   Continuous Infusions:  cefTRIAXone  (ROCEPHIN )  IV 2 g (06/11/23 2322)     LOS: 3 days      Elvan Sor, MD [facility]   If 7PM-7AM, please contact night-coverage  06/12/2023, 4:24 PM

## 2023-06-12 NOTE — TOC CM/SW Note (Signed)
 RE: Terri Burns Date of Birth: [birth year] Date: 06/12/2023   To Whom It May Concern:  Please be advised that the above-named patient will require a short-term nursing home stay - anticipated 30 days or less for rehabilitation and strengthening.  The plan is for return home.

## 2023-06-12 NOTE — NC FL2 (Signed)
 [place]  MEDICAID FL2 LEVEL OF CARE FORM     IDENTIFICATION  Patient Name: Terri Burns Birthdate: Dec 17, 1950 Sex: female Admission Date (Current Location): 06/08/2023  [facility] and [place] Number:  Chiropodist and Address:  [facility], [address] [place], [place] [address]      Provider Number: [id number]  Attending Physician Name and Address:  Von Bellis, MD  Relative Name and Phone Number:       Current Level of Care: Hospital Recommended Level of Care: Skilled Nursing Facility Prior Approval Number:    Date Approved/Denied:   PASRR Number: Manual review  Discharge Plan: SNF    Current Diagnoses: Patient Active Problem List   Diagnosis Date Noted   Right lower lobe pneumonia 06/09/2023   Acute respiratory failure with hypoxia (HCC) 06/09/2023   Coronary artery disease 06/09/2023   Pulmonary nodule 06/09/2023   Acute hypoxic respiratory failure (HCC) 06/09/2023   Community acquired pneumonia of right lower lobe of lung 06/09/2023   CAP (community acquired pneumonia) 06/09/2023   COPD exacerbation (HCC) 06/08/2023   Low vitamin B12 level 05/20/2023   Microcytic anemia 05/20/2023   Iron  deficiency anemia 05/19/2023   Acute exacerbation of bronchiectasis (HCC) 05/17/2023   Pneumonia of right upper lobe due to Streptococcus pneumoniae (HCC) 05/17/2023   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) 01/05/2022   Anxiety and depression 01/05/2022   Dyslipidemia 01/05/2022   Coronary artery disease involving native coronary artery of native heart without angina pectoris 01/05/2022   GERD without esophagitis 01/05/2022   Acute hypoxemic respiratory failure (HCC) 01/05/2022   Centrilobular emphysema (HCC) 07/13/2021   Nicotine  dependence, cigarettes, uncomplicated 04/13/2021   History of non-ST elevation myocardial infarction (NSTEMI)    Depression 09/13/2020   HLD (hyperlipidemia) 09/13/2020   Atherosclerosis of aorta  (HCC) 03/04/2018   Coronary artery calcification of native artery 03/04/2018   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   Chronic neck pain 01/01/2018   Benign essential HTN 01/01/2018   Senile purpura (HCC) 01/01/2018   Hyperglycemia 09/27/2016   Allergic rhinitis, seasonal 05/12/2015   Degeneration of intervertebral disc of cervical region 05/12/2015   GERD (gastroesophageal reflux disease) 05/12/2015   Major depression in remission (HCC) 05/12/2015    Orientation RESPIRATION BLADDER Height & Weight     Self, Time, Situation, Place  O2 (Nasal Cannula 2 L) Continent Weight: 120 lb 8 oz (54.7 kg) Height:  4' 10 (147.3 cm)  BEHAVIORAL SYMPTOMS/MOOD NEUROLOGICAL BOWEL NUTRITION STATUS   (None)  (None) Continent Diet (Regular)  AMBULATORY STATUS COMMUNICATION OF NEEDS Skin   Limited Assist Verbally Bruising, Other (Comment) (Erythema/redness.)                       Personal Care Assistance Level of Assistance  Bathing, Dressing, Feeding Bathing Assistance: Limited assistance Feeding assistance: Limited assistance Dressing Assistance: Limited assistance     Functional Limitations Info  Sight, Hearing, Speech Sight Info: Adequate Hearing Info: Adequate Speech Info: Adequate    SPECIAL CARE FACTORS FREQUENCY  PT (By licensed PT), OT (By licensed OT)     PT Frequency: 5 x week OT Frequency: 5 x week            Contractures Contractures Info: Not present    Additional Factors Info  Code Status, Allergies Code Status Info: Full code Allergies Info: Levofloxacin, Penicillins, Meloxicam , Nsaids           Current Medications (06/12/2023):  This is  the current hospital active medication list Current Facility-Administered Medications  Medication Dose Route Frequency Provider Last Rate Last Admin   acetaminophen  (TYLENOL ) tablet 650 mg  650 mg Oral Q6H PRN Mansy, Jan A, MD   650 mg at 06/10/23 1643   Or   acetaminophen  (TYLENOL ) suppository 650 mg  650 mg  Rectal Q6H PRN Mansy, Jan A, MD       amLODipine  (NORVASC ) tablet 10 mg  10 mg Oral Daily Mansy, Jan A, MD   10 mg at 06/12/23 1031   arformoterol  (BROVANA ) nebulizer solution 15 mcg  15 mcg Nebulization BID Sreenath, Sudheer B, MD   15 mcg at 06/12/23 0814   aspirin  EC tablet 81 mg  81 mg Oral Daily Mansy, Jan A, MD   81 mg at 06/12/23 1031   atorvastatin  (LIPITOR ) tablet 80 mg  80 mg Oral Daily Mansy, Jan A, MD   80 mg at 06/12/23 1029   cefTRIAXone  (ROCEPHIN ) 2 g in sodium chloride  0.9 % 100 mL IVPB  2 g Intravenous Q24H Mansy, Jan A, MD 200 mL/hr at 06/11/23 2322 2 g at 06/11/23 2322   chlorpheniramine-HYDROcodone  (TUSSIONEX) 10-8 MG/5ML suspension 5 mL  5 mL Oral Q12H PRN Mansy, Jan A, MD   5 mL at 06/11/23 2323   citalopram  (CELEXA ) tablet 20 mg  20 mg Oral Daily Mansy, Jan A, MD   20 mg at 06/12/23 1028   cyanocobalamin  (VITAMIN B12) tablet 1,000 mcg  1,000 mcg Oral Daily Mansy, Jan A, MD   1,000 mcg at 06/12/23 1028   enoxaparin  (LOVENOX ) injection 40 mg  40 mg Subcutaneous Q24H Mansy, Jan A, MD   40 mg at 06/11/23 2323   feeding supplement (ENSURE ENLIVE / ENSURE PLUS) liquid 237 mL  237 mL Oral BID BM Sreenath, Sudheer B, MD   237 mL at 06/12/23 1429   ferrous sulfate  tablet 325 mg  325 mg Oral QAC breakfast Mansy, Jan A, MD   325 mg at 06/12/23 1028   guaiFENesin  (ROBITUSSIN) 100 MG/5ML liquid 20 mL  20 mL Oral TID Von Bellis, MD   20 mL at 06/12/23 1427   HYDROcodone -acetaminophen  (NORCO) 10-325 MG per tablet 1 tablet  1 tablet Oral Q4H PRN Sreenath, Sudheer B, MD   1 tablet at 06/12/23 1142   ipratropium-albuterol  (DUONEB) 0.5-2.5 (3) MG/3ML nebulizer solution 3 mL  3 mL Nebulization TID Jhonny Sahara B, MD   3 mL at 06/12/23 1428   isosorbide  mononitrate (IMDUR ) 24 hr tablet 15 mg  15 mg Oral Daily Mansy, Jan A, MD   15 mg at 06/12/23 1028   ketorolac  (TORADOL ) 15 MG/ML injection 15 mg  15 mg Intravenous Q8H Niels Barrio F, RPH   15 mg at 06/12/23 1428   lidocaine   (LIDODERM ) 5 % 1 patch  1 patch Transdermal Q24H Sreenath, Sudheer B, MD   1 patch at 06/12/23 1429   losartan  (COZAAR ) tablet 100 mg  100 mg Oral Daily Mansy, Jan A, MD   100 mg at 06/12/23 1027   magnesium  hydroxide (MILK OF MAGNESIA) suspension 30 mL  30 mL Oral Daily PRN Mansy, Jan A, MD       methocarbamol  (ROBAXIN ) tablet 500 mg  500 mg Oral Q8H PRN Jhonny, Sudheer B, MD   500 mg at 06/11/23 2324   methylPREDNISolone  sodium succinate (SOLU-MEDROL ) 40 mg/mL injection 40 mg  40 mg Intravenous Q24H Sreenath, Sudheer B, MD   40 mg at 06/12/23 1030   metoprolol  succinate (TOPROL -XL)  24 hr tablet 25 mg  25 mg Oral Daily Mansy, Jan A, MD   25 mg at 06/12/23 1000   multivitamin with minerals tablet 1 tablet  1 tablet Oral Daily Sreenath, Sudheer B, MD   1 tablet at 06/12/23 1031   ondansetron  (ZOFRAN ) tablet 4 mg  4 mg Oral Q6H PRN Mansy, Jan A, MD       Or   ondansetron  (ZOFRAN ) injection 4 mg  4 mg Intravenous Q6H PRN Mansy, Jan A, MD       ticagrelor  (BRILINTA ) tablet 90 mg  90 mg Oral BID Mansy, Jan A, MD   90 mg at 06/12/23 1430   traZODone  (DESYREL ) tablet 25 mg  25 mg Oral QHS PRN Mansy, [place] LABOR, MD         Discharge Medications: Please see discharge summary for a list of discharge medications.  Relevant Imaging Results:  Relevant Lab Results:   Additional Information SS#: [id number]  Lauraine JAYSON Carpen, LCSW

## 2023-06-12 NOTE — Progress Notes (Signed)
 Nutrition Follow-up  DOCUMENTATION CODES:   Not applicable  INTERVENTION:   -Continue regular diet -Continue Ensure Enlive po BID, each supplement provides 350 kcal and 20 grams of protein.  -Continue MVI with minerals daily   NUTRITION DIAGNOSIS:   Increased nutrient needs related to chronic illness (COPD) as evidenced by estimated needs.  Ongoing  GOAL:   Patient will meet greater than or equal to 90% of their needs  Progressing   MONITOR:   PO intake, Supplement acceptance  REASON FOR ASSESSMENT:   Consult Assessment of nutrition requirement/status  ASSESSMENT:   Pt with medical history significant for COPD, GERD, hypertension, dyslipidemia and depression, who presented with a Kalisetti of worsening dyspnea with associated Pt cough productive of creamy sputum as well as wheezing since yesterday with no fever or chills.  Reviewed I/O's: +1.8 L since admission  Pt sitting up in bed, receiving breathing treatment at time of visit. Pt reports feeling a little better today. She shares she has a fair appetite, consuming 50% of her meals. Noted meal completions 50-75%. Pt reports she is drinking Ensure supplements and likes them.   Per pt, she intake was fair at baseline. She usually consumes 2 meals per day.   Pt admits to weight loss a few years ago, however, nothing recent. Reviewed wt hx; pt has experienced a 9.3% wt loss over the past 13 months. While this is not significant for time frame, it is concerning given multiple co-morbidities.   Discussed importance of good meal and supplement intake to promote healing. Pt amenable to continue Ensure.   Medications reviewed and include vitamin B-12, lovenox , ferrous sulfate , and solu-medrol .   Labs reviewed: CBGS: 168 (inpatient orders for glycemic control are none).    NUTRITION - FOCUSED PHYSICAL EXAM:  Flowsheet Row Most Recent Value  Orbital Region Mild depletion  Upper Arm Region No depletion  Thoracic and  Lumbar Region No depletion  Buccal Region No depletion  Temple Region Mild depletion  Clavicle Bone Region No depletion  Clavicle and Acromion Bone Region No depletion  Scapular Bone Region No depletion  Dorsal Hand Mild depletion  Patellar Region No depletion  Anterior Thigh Region No depletion  Posterior Calf Region No depletion  Edema (RD Assessment) None  Hair Reviewed  Eyes Reviewed  Mouth Reviewed  Skin Reviewed  Nails Reviewed       Diet Order:   Diet Order             Diet regular Fluid consistency: Thin  Diet effective now                   EDUCATION NEEDS:   Education needs have been addressed  Skin:  Skin Assessment: Reviewed RN Assessment  Last BM:  06/10/22 (type 2)  Height:   Ht Readings from Last 1 Encounters:  06/09/23 4' 10 (1.473 m)    Weight:   Wt Readings from Last 1 Encounters:  06/09/23 54.7 kg    Ideal Body Weight:  43.9 kg  BMI:  Body mass index is 25.18 kg/m.  Estimated Nutritional Needs:   Kcal:  1650-1850  Protein:  85-100 grams  Fluid:  > 1.6 L    Margery ORN, RD, LDN, CDCES Registered Dietitian III Certified Diabetes Care and Education Specialist If unable to reach this RD, please use RD Inpatient group chat on secure chat between hours of 8am-4 pm daily

## 2023-06-12 NOTE — Discharge Instructions (Signed)
 the [facility] offers a [employer] that anyone can call toll free at [phone]. The friendship line is available 24 hours a day  [facility] is a Program of All-inclusive Care for the Elderly (PACE). Their mission is to promote and sustain the independence of seniors wishing to remain in the community. They provide seniors with comprehensive long-term health, social, medical and dietary care. Their program is a safe alternative to nursing home care. [phone]  [facility] Physical Address [facility] [address] [place], [place] [address] Phone: [phone]. . Online zoom yoga class, connect with others without leaving your home [facility] offers [place] dance cardio sessions for individuals via Zoom. This program provides: - Dance fitness activities Please contact program for more information. Servinganyone in need adults 18+ hiv/aids individuals families Call [phone]  Email [email or web address] to get more info  [employer] offers an online [employer] to individuals where they can receive help to focus on their best health. Whether you're a [employer] member or not, the neighborhood center offers a... Main Serviceshealth education  exercise & fitness  community support services  recreation  virtual support Other Servicessupport groups Servinganyone in need adults young adults teens seniors individuals families [email or web address] to get more info  Schedule on their website  The [facility] offers an array of activities for adults age 27 and over. This program provides:- Fitness and health programs- Tech classes- Activity books Main Serviceshealth education  community support services  exercise & fitness  recreation  more education Servingseniors  Call [phone]    For more resources go online to [email or web address] and type in you zipcode

## 2023-06-13 DIAGNOSIS — J189 Pneumonia, unspecified organism: Secondary | ICD-10-CM | POA: Diagnosis not present

## 2023-06-13 LAB — BASIC METABOLIC PANEL
Anion gap: 7 (ref 5–15)
BUN: 28 mg/dL — ABNORMAL HIGH (ref 8–23)
CO2: 28 mmol/L (ref 22–32)
Calcium: 8.3 mg/dL — ABNORMAL LOW (ref 8.9–10.3)
Chloride: 100 mmol/L (ref 98–111)
Creatinine, Ser: 0.52 mg/dL (ref 0.44–1.00)
GFR, Estimated: >60 mL/min (ref >60)
Glucose, Bld: 73 mg/dL (ref 70–99)
Potassium: 4.8 mmol/L (ref 3.5–5.1)
Sodium: 135 mmol/L (ref 135–145)

## 2023-06-13 LAB — GLUCOSE, CAPILLARY
Glucose-Capillary: 58 mg/dL — ABNORMAL LOW (ref 70–99)
Glucose-Capillary: 101 mg/dL — ABNORMAL HIGH (ref 70–99)
Glucose-Capillary: 89 mg/dL (ref 70–99)

## 2023-06-13 LAB — CBC
HCT: 25.7 % — ABNORMAL LOW (ref 36.0–46.0)
Hemoglobin: 8.1 g/dL — ABNORMAL LOW (ref 12.0–15.0)
MCH: 26.5 pg (ref 26.0–34.0)
MCHC: 31.5 g/dL (ref 30.0–36.0)
MCV: 84 fL (ref 80.0–100.0)
Platelets: 268 10*3/uL (ref 150–400)
RBC: 3.06 MIL/uL — ABNORMAL LOW (ref 3.87–5.11)
RDW: 22.6 % — ABNORMAL HIGH (ref 11.5–15.5)
WBC: 7.2 10*3/uL (ref 4.0–10.5)
nRBC: 0 % (ref 0.0–0.2)

## 2023-06-13 LAB — CULTURE, BLOOD (ROUTINE X 2)
Culture: NO GROWTH
Culture: NO GROWTH
Special Requests: ADEQUATE

## 2023-06-13 LAB — PHOSPHORUS: Phosphorus: 4.6 mg/dL (ref 2.5–4.6)

## 2023-06-13 LAB — MAGNESIUM: Magnesium: 2.3 mg/dL (ref 1.7–2.4)

## 2023-06-13 MED ORDER — FLUTICASONE FUROATE-VILANTEROL 200-25 MCG/ACT IN AEPB
1.0000 | INHALATION_SPRAY | Freq: Every day | RESPIRATORY_TRACT | Status: DC
  Administered 2023-06-14 – 2023-06-16 (×3): 1 via RESPIRATORY_TRACT
  Filled 2023-06-13: qty 28

## 2023-06-13 MED ORDER — VITAMIN D (ERGOCALCIFEROL) 1.25 MG (50000 UNIT) PO CAPS
50000.0000 [IU] | ORAL_CAPSULE | ORAL | Status: DC
  Administered 2023-06-13 – 2023-06-20 (×2): 50000 [IU] via ORAL
  Filled 2023-06-13 (×2): qty 1

## 2023-06-13 MED ORDER — ARFORMOTEROL TARTRATE 15 MCG/2ML IN NEBU
15.0000 ug | INHALATION_SOLUTION | Freq: Two times a day (BID) | RESPIRATORY_TRACT | Status: AC
  Administered 2023-06-13: 15 ug via RESPIRATORY_TRACT
  Filled 2023-06-13: qty 2

## 2023-06-13 NOTE — Progress Notes (Signed)
 PROGRESS NOTE    Terri Burns  FMW:[id number] DOB: February 01, 1951 DOA: 06/08/2023 PCP: Sampson Ethridge LABOR, MD    Brief Narrative:  73 y.o. female with medical history significant for COPD, GERD, hypertension, dyslipidemia and depression, who presented to the emergency room with a [place] of worsening dyspnea with associated cough productive of creamy sputum as well as wheezing since yesterday with no fever or chills.  He denied any chest pain or palpitations.  No dysuria, oliguria or hematuria or flank pain.  No nausea or vomiting or abdominal pain.  The patient was initially in significant respiratory distress she was placed on CPAP and was later on tapered to 3 L of O2 by nasal cannula.    Assessment & Plan:   Principal Problem:   CAP (community acquired pneumonia) Active Problems:   COPD exacerbation (HCC)   Right lower lobe pneumonia   Acute respiratory failure with hypoxia (HCC)   Pulmonary nodule   GERD without esophagitis   Benign essential HTN   Depression   Dyslipidemia   Coronary artery disease   Acute hypoxic respiratory failure (HCC)   Community acquired pneumonia of right lower lobe of lung  # COPD exacerbation # Community-acquired pneumonia Likely that infectious process exacerbated COPD.  Patient is hemodynamically stable and appears to be slowly improving. Plan: S/p IV Solu-Medrol  125 x 1 dose, BID x 2 days, daily x 2 days  Started prednisone  tapering dose from 1/7 Continue Rocephin  and azithromycin  Continue Brovana  nebulizer twice daily and DuoNeb every 6 hourly scheduled Wean oxygen as tolerated  # Acute hypoxic respiratory failure Secondary to above Management as above Wean oxygen as tolerated  # Pulmonary nodules Outpatient pulmonary follow-up  # GERD PPI  # CAD PTA aspirin  and Brilinta  PTA Toprol  and Imdur   # Hyperlipidemia PTA statin  # Depression PTA Celexa   # Hypertension BP controlled  # Functional decline Therapy  evaluations  # Vitamin D  deficiency: started vitamin D  50,000 units p.o. weekly, follow with PCP to repeat vitamin D  level after 3 to 6 months.  # Iron  deficiency anemia, transferrin saturation 6%.  Continue oral iron  supplement. # Vitamin B12 level 206, goal >400.  Continue B12 supplement.  Follow with PCP to repeat labs after 3 to 6 months.   DVT prophylaxis: SQ Lovenox  Code Status: Full Family Communication: Left VM for son Riva [phone] on 1/4 Disposition Plan: Status is: Inpatient Remains inpatient appropriate because: COPD exacerbation   Level of care: Telemetry Medical  Consultants:  None  Procedures:  None  Antimicrobials: Rocephin  Azithromycin    Subjective: No significant events overnight, patient wears oxygen 2 L at baseline Complaining of mild productive cough, overall feels improvement.  Patient is requesting for PPI home dose.   Objective: Vitals:   06/13/23 0337 06/13/23 0810 06/13/23 1453 06/13/23 1613  BP: 123/82 134/85  101/67  Pulse: 70 72  66  Resp: 12 18  16   Temp: 97.9 F (36.6 C) 98.1 F (36.7 C)  98.1 F (36.7 C)  TempSrc: Oral Oral    SpO2: 100% 99% 98% 93%  Weight:      Height:        Intake/Output Summary (Last 24 hours) at 06/13/2023 1727 Last data filed at 06/13/2023 1541 Gross per 24 hour  Intake 480 ml  Output 400 ml  Net 80 ml    Filed Weights   06/08/23 1801 06/09/23 2100  Weight: 56.2 kg 54.7 kg    Examination:  General exam: Appears fatigued and chronically ill  Respiratory system: Equal air entry bilaterally, mild crackles and mild wheezing bilaterally Cardiovascular system: S1-S2, RRR, no murmurs, no pedal edema Gastrointestinal system: Soft, NT/ND, normal bowel sounds Central nervous system: Alert and oriented. No focal neurological deficits. Extremities: Symmetrically decreased power Skin: No rashes, lesions or ulcers Psychiatry: Judgement and insight appear normal. Mood & affect appropriate.     Data  Reviewed: I have personally reviewed following labs and imaging studies  CBC: Recent Labs  Lab 06/08/23 1820 06/09/23 0350 06/12/23 1044 06/13/23 0509  WBC 7.4 4.3 10.1 7.2  NEUTROABS 5.4  --   --   --   HGB 8.6* 8.4* 8.7* 8.1*  HCT 28.2* 27.5* 28.1* 25.7*  MCV 87.0 86.8 85.2 84.0  PLT 219 210 283 268   Basic Metabolic Panel: Recent Labs  Lab 06/08/23 1820 06/09/23 0350 06/12/23 1044 06/13/23 0509  NA 132* 133* 135 135  K 5.2* 4.8 4.7 4.8  CL 98 103 99 100  CO2 23 22 26 28   GLUCOSE 101* 191* 96 73  BUN 19 14 31* 28*  CREATININE 0.83 0.64 0.73 0.52  CALCIUM  8.3* 7.9* 8.4* 8.3*  MG  --   --  2.3 2.3  PHOS  --   --  3.7 4.6   GFR: Estimated Creatinine Clearance: 47.2 mL/min (by C-G formula based on SCr of 0.52 mg/dL). Liver Function Tests: Recent Labs  Lab 06/08/23 1820  AST 20  ALT 22  ALKPHOS 50  BILITOT 0.5  PROT 6.1*  ALBUMIN 3.4*   No results for input(s): LIPASE, AMYLASE in the last 168 hours. No results for input(s): AMMONIA in the last 168 hours. Coagulation Profile: Recent Labs  Lab 06/08/23 1820  INR 1.0   Cardiac Enzymes: No results for input(s): CKTOTAL, CKMB, CKMBINDEX, TROPONINI in the last 168 hours. BNP (last 3 results) No results for input(s): PROBNP in the last 8760 hours. HbA1C: No results for input(s): HGBA1C in the last 72 hours. CBG: Recent Labs  Lab 06/10/23 1533 06/10/23 1704 06/13/23 0813 06/13/23 0836 06/13/23 1103  GLUCAP 168* 168* 58* 101* 89   Lipid Profile: No results for input(s): CHOL, HDL, LDLCALC, TRIG, CHOLHDL, LDLDIRECT in the last 72 hours. Thyroid Function Tests: No results for input(s): TSH, T4TOTAL, FREET4, T3FREE, THYROIDAB in the last 72 hours. Anemia Panel: No results for input(s): VITAMINB12, FOLATE, FERRITIN, TIBC, IRON , RETICCTPCT in the last 72 hours. Sepsis Labs: Recent Labs  Lab 06/08/23 1821 06/08/23 2053  LATICACIDVEN 0.7 0.9     Recent Results (from the past 240 hours)  Resp panel by RT-PCR (RSV, Flu A&B, Covid) Anterior Nasal Swab     Status: None   Collection Time: 06/08/23  6:28 PM   Specimen: Anterior Nasal Swab  Result Value Ref Range Status   SARS Coronavirus 2 by RT PCR NEGATIVE NEGATIVE Final    Comment: (NOTE) SARS-CoV-2 target nucleic acids are NOT DETECTED.  The SARS-CoV-2 RNA is generally detectable in upper respiratory specimens during the acute phase of infection. The lowest concentration of SARS-CoV-2 viral copies this assay can detect is 138 copies/mL. A negative result does not preclude SARS-Cov-2 infection and should not be used as the sole basis for treatment or other patient management decisions. A negative result may occur with  improper specimen collection/handling, submission of specimen other than nasopharyngeal swab, presence of viral mutation(s) within the areas targeted by this assay, and inadequate number of viral copies(<138 copies/mL). A negative result must be combined with clinical observations, patient history, and epidemiological information.  The expected result is Negative.  Fact Sheet for Patients:  bloggercourse.com  Fact Sheet for Healthcare Providers:  seriousbroker.it  This test is no t yet approved or cleared by the [place]  FDA and  has been authorized for detection and/or diagnosis of SARS-CoV-2 by FDA under an Emergency Use Authorization (EUA). This EUA will remain  in effect (meaning this test can be used) for the duration of the COVID-19 declaration under Section 564(b)(1) of the Act, 21 U.S.C.section 360bbb-3(b)(1), unless the authorization is terminated  or revoked sooner.       Influenza A by PCR NEGATIVE NEGATIVE Final   Influenza B by PCR NEGATIVE NEGATIVE Final    Comment: (NOTE) The Xpert Xpress SARS-CoV-2/FLU/RSV plus assay is intended as an aid in the diagnosis of influenza from  Nasopharyngeal swab specimens and should not be used as a sole basis for treatment. Nasal washings and aspirates are unacceptable for Xpert Xpress SARS-CoV-2/FLU/RSV testing.  Fact Sheet for Patients: bloggercourse.com  Fact Sheet for Healthcare Providers: seriousbroker.it  This test is not yet approved or cleared by the [place]  FDA and has been authorized for detection and/or diagnosis of SARS-CoV-2 by FDA under an Emergency Use Authorization (EUA). This EUA will remain in effect (meaning this test can be used) for the duration of the COVID-19 declaration under Section 564(b)(1) of the Act, 21 U.S.C. section 360bbb-3(b)(1), unless the authorization is terminated or revoked.     Resp Syncytial Virus by PCR NEGATIVE NEGATIVE Final    Comment: (NOTE) Fact Sheet for Patients: bloggercourse.com  Fact Sheet for Healthcare Providers: seriousbroker.it  This test is not yet approved or cleared by the [place]  FDA and has been authorized for detection and/or diagnosis of SARS-CoV-2 by FDA under an Emergency Use Authorization (EUA). This EUA will remain in effect (meaning this test can be used) for the duration of the COVID-19 declaration under Section 564(b)(1) of the Act, 21 U.S.C. section 360bbb-3(b)(1), unless the authorization is terminated or revoked.  Performed at [facility], [address] [place], [place] [address]   Blood Culture (routine x 2)     Status: None   Collection Time: 06/08/23  6:30 PM   Specimen: BLOOD  Result Value Ref Range Status   Specimen Description BLOOD LEFT ASSIST CONTROL  Final   Special Requests   Final    BOTTLES DRAWN AEROBIC AND ANAEROBIC Blood Culture adequate volume   Culture   Final    NO GROWTH 5 DAYS Performed at [facility], [address] [place], [place] [address]    Report Status 06/13/2023 FINAL   Final  Blood Culture (routine x 2)     Status: None   Collection Time: 06/08/23  6:36 PM   Specimen: BLOOD  Result Value Ref Range Status   Specimen Description BLOOD RIGHT ANTECUBITAL  Final   Special Requests BOTTLES DRAWN AEROBIC AND ANAEROBIC BCAV  Final   Culture   Final    NO GROWTH 5 DAYS Performed at [facility], [address] [place], [place] [address]    Report Status 06/13/2023 FINAL  Final  Respiratory (~20 pathogens) panel by PCR     Status: None   Collection Time: 06/10/23 10:30 PM   Specimen: Nasopharyngeal Swab; Respiratory  Result Value Ref Range Status   Adenovirus NOT DETECTED NOT DETECTED Final   Coronavirus 229E NOT DETECTED NOT DETECTED Final    Comment: (NOTE) The Coronavirus on the Respiratory Panel, DOES NOT test for the novel  Coronavirus (2019 nCoV)    Coronavirus HKU1 NOT DETECTED NOT DETECTED Final   Coronavirus NL63 NOT DETECTED NOT DETECTED Final   Coronavirus OC43 NOT DETECTED NOT DETECTED Final   Metapneumovirus NOT DETECTED NOT DETECTED Final   Rhinovirus / Enterovirus NOT DETECTED NOT DETECTED Final   Influenza A NOT DETECTED NOT DETECTED Final   Influenza B NOT DETECTED NOT DETECTED Final   Parainfluenza Virus 1 NOT DETECTED NOT DETECTED Final   Parainfluenza Virus 2 NOT DETECTED NOT DETECTED Final   Parainfluenza Virus 3 NOT DETECTED NOT DETECTED Final   Parainfluenza Virus 4 NOT DETECTED NOT DETECTED Final   Respiratory Syncytial Virus NOT DETECTED NOT DETECTED Final   Bordetella pertussis NOT DETECTED NOT DETECTED Final   Bordetella Parapertussis NOT DETECTED NOT DETECTED Final   Chlamydophila pneumoniae NOT DETECTED NOT DETECTED Final   Mycoplasma pneumoniae NOT DETECTED NOT DETECTED Final    Comment: Performed at [facility], [address] [place], [place] [address]         Radiology Studies: No results found.       Scheduled Meds:  amLODipine   10 mg Oral Daily   arformoterol   15 mcg Nebulization BID    aspirin  EC  81 mg Oral Daily   atorvastatin   80 mg Oral Daily   citalopram   20 mg Oral Daily   cyanocobalamin   1,000 mcg Oral Daily   enoxaparin  (LOVENOX ) injection  40 mg Subcutaneous Q24H   feeding supplement  237 mL Oral BID BM   ferrous sulfate   325 mg Oral QAC breakfast   guaiFENesin   20 mL Oral TID   ipratropium-albuterol   3 mL Nebulization TID   isosorbide  mononitrate  15 mg Oral Daily   ketorolac   15 mg Intravenous Q8H   lidocaine   1 patch Transdermal Q24H   losartan   100 mg Oral Daily   metoprolol  succinate  25 mg Oral Daily   multivitamin with minerals  1 tablet Oral Daily   pantoprazole   40 mg Oral Daily   predniSONE   40 mg Oral Q breakfast   Followed by   NOREEN ON 06/16/2023] predniSONE   30 mg Oral Q breakfast   Followed by   NOREEN ON 06/19/2023] predniSONE   20 mg Oral Q breakfast   Followed by   NOREEN ON 06/22/2023] predniSONE   10 mg Oral Q breakfast   ticagrelor   90 mg Oral BID   Vitamin D  (Ergocalciferol )  50,000 Units Oral Q7 days   Continuous Infusions:     LOS: 4 days    Time spent: 35 minutes  Elvan Sor, MD [facility]   If 7PM-7AM, please contact night-coverage  06/13/2023, 5:27 PM

## 2023-06-13 NOTE — Progress Notes (Signed)
 Physical Therapy Treatment Patient Details Name: Terri Burns MRN: [id number] DOB: 07/01/1950 Today's Date: 06/13/2023   History of Present Illness Pt is a 73 y/o F admitted on 06/08/23 after presenting with c/o worsening dyspnea with cough, sputum production, & wheezing. Pt is being treated for COPD exacerbation, RLL PNA. PMH: COPD, GERD, HTN, dyslipidemia, depression    PT Comments  Pt was pleasant and motivated to participate during the session and put forth good effort throughout. Pt found on 1.5LO2/min with SpO2 98% and HR 64 bpm.  Pt required extra time and effort with bed mobility tasks and to come to standing from various height surfaces but no physical assistance.  Pt was able to amb 20 feet with slow cadence and cues for sequencing for general safety with the RW with only min SOB and with SpO2 97% and HR 79 bpm on 2L (no 1.5L option on tank).  After a therapeutic rest break pt then able to amb 40 feet with SpO2 95% and HR 74 bpm with again only min SOB that resolved quickly upon sitting.  Pt will benefit from continued PT services upon discharge to safely address deficits listed in patient problem list for decreased caregiver assistance and eventual return to PLOF.       If plan is discharge home, recommend the following: A little help with walking and/or transfers;A little help with bathing/dressing/bathroom;Assist for transportation;Help with stairs or ramp for entrance;Assistance with cooking/housework   Can travel by private vehicle     Yes  Equipment Recommendations  None recommended by PT    Recommendations for Other Services       Precautions / Restrictions Precautions Precautions: Fall Restrictions Weight Bearing Restrictions Per Provider Order: No     Mobility  Bed Mobility Overal bed mobility: Needs Assistance         Sit to supine: Supervision, HOB elevated   General bed mobility comments: Mod extra time and effort and use of bed rail but no physical  assistance needed    Transfers Overall transfer level: Needs assistance Equipment used: Rolling walker (2 wheels) Transfers: Sit to/from Stand Sit to Stand: Contact guard assist           General transfer comment: Min verbal cues for hand placement    Ambulation/Gait Ambulation/Gait assistance: Contact guard assist Gait Distance (Feet): 20 Feet Assistive device: Rolling walker (2 wheels) Gait Pattern/deviations: Decreased step length - right, Decreased step length - left, Trunk flexed, Step-through pattern Gait velocity: decreased     General Gait Details: Slow cadence but grossly improved compared to prior session with min verbal cues for sequencing with the RW; steady with no overt LOB   Stairs             Wheelchair Mobility     Tilt Bed    Modified Rankin (Stroke Patients Only)       Balance Overall balance assessment: Needs assistance Sitting-balance support: Feet supported, Single extremity supported, No upper extremity supported Sitting balance-Leahy Scale: Good     Standing balance support: During functional activity, Bilateral upper extremity supported Standing balance-Leahy Scale: Good                              Cognition Arousal: Alert Behavior During Therapy: WFL for tasks assessed/performed Overall Cognitive Status: Within Functional Limits for tasks assessed  Exercises Total Joint Exercises Ankle Circles/Pumps: AROM, Strengthening, Both, 10 reps Hip ABduction/ADduction: Strengthening, Both, 5 reps Long Arc Quad: Strengthening, Both, 10 reps Knee Flexion: Strengthening, Both, 10 reps Low amplitude mini squats x 4    General Comments        Pertinent Vitals/Pain Pain Assessment Pain Assessment: No/denies pain    Home Living                          Prior Function            PT Goals (current goals can now be found in the care plan section)  Progress towards PT goals: Progressing toward goals    Frequency    Min 1X/week      PT Plan      Co-evaluation              AM-PAC PT 6 Clicks Mobility   Outcome Measure  Help needed turning from your back to your side while in a flat bed without using bedrails?: None Help needed moving from lying on your back to sitting on the side of a flat bed without using bedrails?: None Help needed moving to and from a bed to a chair (including a wheelchair)?: A Little Help needed standing up from a chair using your arms (e.g., wheelchair or bedside chair)?: A Little Help needed to walk in hospital room?: A Little Help needed climbing 3-5 steps with a railing? : A Little 6 Click Score: 20    End of Session Equipment Utilized During Treatment: Oxygen;Gait belt Activity Tolerance: Patient tolerated treatment well Patient left: in chair;with call bell/phone within reach;with chair alarm set Nurse Communication: Mobility status PT Visit Diagnosis: Muscle weakness (generalized) (M62.81);Unsteadiness on feet (R26.81);Difficulty in walking, not elsewhere classified (R26.2)     Time: 8364-8293 PT Time Calculation (min) (ACUTE ONLY): 31 min  Charges:    $Gait Training: 8-22 mins $Therapeutic Activity: 8-22 mins PT General Charges $$ ACUTE PT VISIT: 1 Visit                    D. Scott Yvett Rossel PT, DPT 06/13/23, 5:41 PM

## 2023-06-13 NOTE — Progress Notes (Signed)
 Occupational Therapy Treatment Patient Details Name: ARTASIA THANG MRN: [id number] DOB: Oct 30, 1950 Today's Date: 06/13/2023   History of present illness Pt is a 73 y/o F admitted on 06/08/23 after presenting with c/o worsening dyspnea with cough, sputum production, & wheezing. Pt is being treated for COPD exacerbation, RLL PNA. PMH: COPD, GERD, HTN, dyslipidemia, depression   OT comments  Ms. Colasurdo was seen for OT treatment on this date. Upon arrival to room pt supine in bed, agreeable to OT Tx session. OT facilitated ADL management with education and assist as described below. See ADL section for additional details regarding occupational performance. Pt continues to be functionally limited by generalized weakness, decreased cardiopulmonary status, and decreased activity tolerance. She requires CGA for safety with brief functional transfers and MIN-MOD A for LB ADL management from STS. Pt return verbalizes understanding of education provided t/o session. Pt is progressing toward OT goals and continues to benefit from skilled OT services to maximize return to PLOF and minimize risk of future falls, injury, caregiver burden, and readmission. DC recommendation updated to STR to maximize pt safety, return to PLOF and minimize risk of re-admission. POC updated.        If plan is discharge home, recommend the following:  A little help with walking and/or transfers;Assistance with cooking/housework;Assist for transportation;Help with stairs or ramp for entrance;A lot of help with bathing/dressing/bathroom   Equipment Recommendations  None recommended by OT    Recommendations for Other Services      Precautions / Restrictions Precautions Precautions: Fall Restrictions Weight Bearing Restrictions Per Provider Order: No       Mobility Bed Mobility Overal bed mobility: Needs Assistance Bed Mobility: Supine to Sit     Supine to sit: Supervision, Used rails, HOB elevated Sit to supine:  Supervision, HOB elevated   General bed mobility comments: Mod extra time and effort and use of bed rail    Transfers Overall transfer level: Needs assistance Equipment used: 1 person hand held assist Transfers: Sit to/from Stand Sit to Stand: Contact guard assist     Step pivot transfers: Contact guard assist     General transfer comment: CGA to take a few steps to sink from EOB.     Balance Overall balance assessment: Needs assistance Sitting-balance support: Feet supported, Single extremity supported, No upper extremity supported Sitting balance-Leahy Scale: Good Sitting balance - Comments: generally maintains at least 1UE support on seated surface.   Standing balance support: No upper extremity supported, During functional activity, Single extremity supported Standing balance-Leahy Scale: Fair Standing balance comment: briefly able to stand without UE support during functional activity.                           ADL either performed or assessed with clinical judgement   ADL Overall ADL's : Needs assistance/impaired Eating/Feeding: Sitting;Supervision/ safety;Set up   Grooming: Standing;Wash/dry face;Contact guard assist Grooming Details (indicate cue type and reason): fatigues quickly, benefits from multiple therapeutic rest breaks during session.                             Functional mobility during ADLs: Contact guard assist;Cueing for safety General ADL Comments: Pt able to stand without UE support at sink for UB grooming tasks. Educated on energy conservation strategies with emphasis this session on PLB techniques and activity pacing. Pt return demos understanding t/o session. She requires CGA for safety with functional transfers  and SUPERVISION for brief standing grooming task at room sink. Anticipate increased assistance for more exertional ADL management including LB bathing/dressing.    Extremity/Trunk Assessment Upper Extremity  Assessment Upper Extremity Assessment: Generalized weakness   Lower Extremity Assessment Lower Extremity Assessment: Generalized weakness   Cervical / Trunk Assessment Cervical / Trunk Assessment: Kyphotic Cervical / Trunk Exceptions: baseline R Lateral head Tilt while seated; Hx of Cervical Fx and scoliosis    Vision Patient Visual Report: No change from baseline     Perception     Praxis      Cognition Arousal: Alert Behavior During Therapy: WFL for tasks assessed/performed Overall Cognitive Status: Within Functional Limits for tasks assessed                                          Exercises Other Exercises Other Exercises: OT facilitated problem solving to help incorporate learned ECS including activty pacing and work simplification strategies into daily ADL/IADL/mobility routines to improve safety/independence.    Shoulder Instructions       General Comments Pt recieved on 1.5 L/min [place] spO2 96% at rest in bed. Once seated EOB spO2 maintains at 97-98%. Pt decreased to 1 L [place] and sustains stable spO2 >/= 95% during functional activity. O2 sats at end of session 97-98% on 1L. Pt left on 1L [place].    Pertinent Vitals/ Pain       Pain Assessment Pain Assessment: 0-10 Pain Score: 5  Pain Location: Chronic back pain per patient Pain Descriptors / Indicators: Sore Pain Intervention(s): Limited activity within patient's tolerance, Monitored during session, Repositioned  Home Living                                          Prior Functioning/Environment              Frequency  Min 1X/week        Progress Toward Goals  OT Goals(current goals can now be found in the care plan section)  Progress towards OT goals: Progressing toward goals  Acute Rehab OT Goals Patient Stated Goal: to go home OT Goal Formulation: With patient Time For Goal Achievement: 06/23/23 Potential to Achieve Goals: Good  Plan      Co-evaluation                  AM-PAC OT 6 Clicks Daily Activity     Outcome Measure   Help from another person eating meals?: None Help from another person taking care of personal grooming?: A Little Help from another person toileting, which includes using toliet, bedpan, or urinal?: A Little Help from another person bathing (including washing, rinsing, drying)?: A Lot Help from another person to put on and taking off regular upper body clothing?: A Little Help from another person to put on and taking off regular lower body clothing?: A Lot 6 Click Score: 17    End of Session Equipment Utilized During Treatment: Oxygen  OT Visit Diagnosis: Other abnormalities of gait and mobility (R26.89);Muscle weakness (generalized) (M62.81);Pain Pain - Right/Left:  (back)   Activity Tolerance Patient tolerated treatment well   Patient Left in bed;with call bell/phone within reach;with bed alarm set   Nurse Communication          Time: 8982-8961 OT Time Calculation (min):  21 min  Charges: OT General Charges $OT Visit: 1 Visit OT Treatments $Self Care/Home Management : 8-22 mins  Jhonny Pelton, M.S., OTR/L 06/13/23, 12:13 PM

## 2023-06-13 NOTE — TOC Progression Note (Signed)
 Transition of Care [facility]) - Progression Note    Patient Details  Name: Terri Burns MRN: [id number] Date of Birth: Sep 10, 1950  Transition [facility]) CM/SW Contact  Corean ONEIDA Haddock, RN Phone Number: 06/13/2023, 12:06 PM  Clinical Narrative:      Updated clinical uploaded to [facility] Met with patient at bedside to present bed offer.  Patient declines SNF and plans to return home with home health services through [facility] Patient requiring Acute O2.  Patient states that her son will transport at discharge  If patient requires O2 at discharge will require qualifying sats   Expected Discharge Plan: Home w Home Health Services Barriers to Discharge: Continued Medical Work up  Expected Discharge Plan and Services       Living arrangements for the past 2 months: Single Family Home                           HH Arranged: PT, OT [facility] Agency: [facility] Date [facility] Agency Contacted: 06/10/23   Representative spoke with at [facility] Agency: [name]   Social Determinants of Health (SDOH) Interventions SDOH Screenings   Food Insecurity: No Food Insecurity (06/09/2023)  Housing: Low Risk  (06/09/2023)  Transportation Needs: No Transportation Needs (06/09/2023)  Utilities: Not At Risk (06/09/2023)  Alcohol Screen: Low Risk  (07/13/2021)  Depression (PHQ2-9): Low Risk  (05/11/2022)  Financial Resource Strain: Low Risk  (07/13/2021)  Physical Activity: Insufficiently Active (07/13/2021)  Social Connections: Socially Isolated (06/09/2023)  Stress: No Stress Concern Present (07/13/2021)  Tobacco Use: High Risk (06/10/2023)    Readmission Risk Interventions     No data to display

## 2023-06-13 NOTE — Care Management Important Message (Signed)
 Important Message  Patient Details  Name: Terri Burns MRN: [id number] Date of Birth: Jul 03, 1950   Important Message Given:  Yes - Medicare IM     Sherilyn Banker 06/13/2023, 2:51 PM

## 2023-06-14 DIAGNOSIS — J189 Pneumonia, unspecified organism: Secondary | ICD-10-CM | POA: Diagnosis not present

## 2023-06-14 LAB — GLUCOSE, CAPILLARY: Glucose-Capillary: 204 mg/dL — ABNORMAL HIGH (ref 70–99)

## 2023-06-14 NOTE — Progress Notes (Signed)
 Physical Therapy Treatment Patient Details Name: Terri Burns MRN: [id number] DOB: February 14, 1951 Today's Date: 06/14/2023   History of Present Illness Pt is a 73 y/o F admitted on 06/08/23 after presenting with c/o worsening dyspnea with cough, sputum production, & wheezing. Pt is being treated for COPD exacerbation, RLL PNA. PMH: COPD, GERD, HTN, dyslipidemia, depression    PT Comments  Pt was pleasant and motivated to participate during the session and put forth good effort throughout. Pt demonstrated grossly improved speed and effort with bed mobility tasks and was steady with transfers and gait.  Pt found on 2LO2/min with SpO2 98% and HR 70 bpm.  After amb 60' SpO2 dropped to a low of 96% with HR 72.  Nursing in room and requested amb trial on 1LO2/min.  Pt able to amb another 30' with one standing rest break near the end secondary to back pain and SOB with SpO2 93% and HR 78.  Pt returned to 2LO2/min at end of session per nursing request secondary to pt's reported SOB.  Pt will benefit from continued PT services upon discharge to safely address deficits listed in patient problem list for decreased caregiver assistance and eventual return to PLOF.      If plan is discharge home, recommend the following: A little help with walking and/or transfers;A little help with bathing/dressing/bathroom;Assist for transportation;Help with stairs or ramp for entrance;Assistance with cooking/housework   Can travel by private vehicle     Yes  Equipment Recommendations  None recommended by PT    Recommendations for Other Services       Precautions / Restrictions Precautions Precautions: Fall Restrictions Weight Bearing Restrictions Per Provider Order: No     Mobility  Bed Mobility Overal bed mobility: Modified Independent             General bed mobility comments: Min extra time and effort only    Transfers Overall transfer level: Needs assistance Equipment used: Rolling walker (2  wheels) Transfers: Sit to/from Stand Sit to Stand: Supervision           General transfer comment: Good control and stability with no cues needed for hand placement this session    Ambulation/Gait Ambulation/Gait assistance: Contact guard assist, Supervision Gait Distance (Feet): 60 Feet x 2 Assistive device: Rolling walker (2 wheels) Gait Pattern/deviations: Decreased step length - right, Decreased step length - left, Trunk flexed, Step-through pattern Gait velocity: decreased     General Gait Details: Slow cadence with min verbal cues for sequencing with the RW; steady with no overt LOB   Stairs             Wheelchair Mobility     Tilt Bed    Modified Rankin (Stroke Patients Only)       Balance Overall balance assessment: Needs assistance Sitting-balance support: Feet supported, No upper extremity supported Sitting balance-Leahy Scale: Good     Standing balance support: Bilateral upper extremity supported, During functional activity Standing balance-Leahy Scale: Good                              Cognition Arousal: Alert Behavior During Therapy: WFL for tasks assessed/performed Overall Cognitive Status: Within Functional Limits for tasks assessed                                          Exercises  Total Joint Exercises Ankle Circles/Pumps: AROM, Strengthening, Both, 10 reps Quad Sets: Strengthening, Both, 10 reps Long Arc Quad: Strengthening, Both, 10 reps Knee Flexion: Strengthening, Both, 10 reps    General Comments        Pertinent Vitals/Pain Pain Assessment Pain Assessment: 0-10 Pain Score: 7  Pain Location: Chronic back pain per patient Pain Descriptors / Indicators: Sore, Aching Pain Intervention(s): Repositioned, Premedicated before session, Monitored during session, Patient requesting pain meds-RN notified    Home Living                          Prior Function            PT Goals  (current goals can now be found in the care plan section) Progress towards PT goals: Progressing toward goals    Frequency    Min 1X/week      PT Plan      Co-evaluation              AM-PAC PT 6 Clicks Mobility   Outcome Measure  Help needed turning from your back to your side while in a flat bed without using bedrails?: None Help needed moving from lying on your back to sitting on the side of a flat bed without using bedrails?: None Help needed moving to and from a bed to a chair (including a wheelchair)?: A Little Help needed standing up from a chair using your arms (e.g., wheelchair or bedside chair)?: A Little Help needed to walk in hospital room?: A Little Help needed climbing 3-5 steps with a railing? : A Little 6 Click Score: 20    End of Session Equipment Utilized During Treatment: Oxygen;Gait belt Activity Tolerance: Patient tolerated treatment well Patient left: in chair;with call bell/phone within reach;with chair alarm set;with nursing/sitter in room Nurse Communication: Mobility status PT Visit Diagnosis: Muscle weakness (generalized) (M62.81);Unsteadiness on feet (R26.81);Difficulty in walking, not elsewhere classified (R26.2)     Time: 8597-8571 PT Time Calculation (min) (ACUTE ONLY): 26 min  Charges:    $Gait Training: 8-22 mins $Therapeutic Activity: 8-22 mins PT General Charges $$ ACUTE PT VISIT: 1 Visit                     D. Scott Lashauna Arpin PT, DPT 06/14/23, 2:47 PM

## 2023-06-14 NOTE — Plan of Care (Signed)

## 2023-06-14 NOTE — Progress Notes (Signed)
 Mobility Specialist - Progress Note   06/14/23 1201  Mobility  Activity Ambulated with assistance in room  Level of Assistance Contact guard assist, steadying assist  Assistive Device None  Distance Ambulated (ft) 8 ft  Activity Response Tolerated well  Mobility visit 1 Mobility  Mobility Specialist Start Time (ACUTE ONLY) 1133  Mobility Specialist Stop Time (ACUTE ONLY) 1140  Mobility Specialist Time Calculation (min) (ACUTE ONLY) 7 min   Pt sitting on the BSC upon entry, utilizing 2L. Pt STS and amb around the bed CGA, no AD--- constantly holding onto bed railing with LUE and HHA of the RUE. Pt expressed feeling of dyspnea upon return to bed, O2 >90%. Pt left simi fowler with alarm set and needs within reach.  America Silvan Mobility Specialist 06/14/23 12:09 PM

## 2023-06-14 NOTE — Progress Notes (Signed)
 PROGRESS NOTE    Terri Burns  FMW:[id number] DOB: Nov 23, 1950 DOA: 06/08/2023 PCP: Sampson Ethridge LABOR, MD    Brief Narrative:  73 y.o. female with medical history significant for COPD, GERD, hypertension, dyslipidemia and depression, who presented to the emergency room with a [place] of worsening dyspnea with associated cough productive of creamy sputum as well as wheezing since yesterday with no fever or chills.  He denied any chest pain or palpitations.  No dysuria, oliguria or hematuria or flank pain.  No nausea or vomiting or abdominal pain.  The patient was initially in significant respiratory distress she was placed on CPAP and was later on tapered to 3 L of O2 by nasal cannula.    Assessment & Plan:   Principal Problem:   CAP (community acquired pneumonia) Active Problems:   COPD exacerbation (HCC)   Right lower lobe pneumonia   Acute respiratory failure with hypoxia (HCC)   Pulmonary nodule   GERD without esophagitis   Benign essential HTN   Depression   Dyslipidemia   Coronary artery disease   Acute hypoxic respiratory failure (HCC)   Community acquired pneumonia of right lower lobe of lung  # COPD exacerbation # Community-acquired pneumonia Likely that infectious process exacerbated COPD.  Patient is hemodynamically stable and appears to be slowly improving. Plan: S/p IV Solu-Medrol  125 x 1 dose, BID x 2 days, daily x 2 days  Started prednisone  tapering dose from 1/7 Continue Rocephin  and azithromycin  Continue Brovana  nebulizer twice daily and DuoNeb every 6 hourly scheduled Wean oxygen as tolerated  # Acute hypoxic respiratory failure Secondary to above Management as above Wean oxygen as tolerated  # Pulmonary nodules Outpatient pulmonary follow-up  # GERD PPI  # CAD PTA aspirin  and Brilinta  PTA Toprol  and Imdur   # Hyperlipidemia PTA statin  # Depression PTA Celexa   # Hypertension BP controlled  # Functional decline Therapy  evaluations  # Vitamin D  deficiency: started vitamin D  50,000 units p.o. weekly, follow with PCP to repeat vitamin D  level after 3 to 6 months.  # Iron  deficiency anemia, transferrin saturation 6%.  Continue oral iron  supplement. # Vitamin B12 level 206, goal >400.  Continue B12 supplement.  Follow with PCP to repeat labs after 3 to 6 months.   DVT prophylaxis: SQ Lovenox  Code Status: Full Family Communication: Left VM for son Riva [phone] on 1/4 Disposition Plan: Status is: Inpatient Remains inpatient appropriate because: COPD exacerbation   Level of care: Telemetry Medical  Consultants:  None  Procedures:  None  Antimicrobials: Rocephin  Azithromycin    Subjective: No significant events overnight, still having mild shortness of breath with productive cough, feels congestion.  Patient is still not back to her baseline.   Objective: Vitals:   06/13/23 1918 06/13/23 2011 06/14/23 0507 06/14/23 0728  BP:  138/73 (!) 150/73 135/84  Pulse:  72 70 69  Resp:  20 20 16   Temp:  98.5 F (36.9 C) 98.2 F (36.8 C) 97.8 F (36.6 C)  TempSrc:  Oral Oral Oral  SpO2: 98% 100% 100% 99%  Weight:      Height:        Intake/Output Summary (Last 24 hours) at 06/14/2023 1429 Last data filed at 06/14/2023 1300 Gross per 24 hour  Intake 360 ml  Output --  Net 360 ml    Filed Weights   06/08/23 1801 06/09/23 2100  Weight: 56.2 kg 54.7 kg    Examination:  General exam: Appears fatigued and chronically ill Respiratory system: Equal  air entry bilaterally, bilateral crackles and mild wheezing bilaterally. Cardiovascular system: S1-S2, RRR, no murmurs, no pedal edema Gastrointestinal system: Soft, NT/ND, normal bowel sounds Central nervous system: Alert and oriented. No focal neurological deficits. Extremities: Symmetrically decreased power Skin: No rashes, lesions or ulcers Psychiatry: Judgement and insight appear normal. Mood & affect appropriate.     Data Reviewed: I  have personally reviewed following labs and imaging studies  CBC: Recent Labs  Lab 06/08/23 1820 06/09/23 0350 06/12/23 1044 06/13/23 0509  WBC 7.4 4.3 10.1 7.2  NEUTROABS 5.4  --   --   --   HGB 8.6* 8.4* 8.7* 8.1*  HCT 28.2* 27.5* 28.1* 25.7*  MCV 87.0 86.8 85.2 84.0  PLT 219 210 283 268   Basic Metabolic Panel: Recent Labs  Lab 06/08/23 1820 06/09/23 0350 06/12/23 1044 06/13/23 0509  NA 132* 133* 135 135  K 5.2* 4.8 4.7 4.8  CL 98 103 99 100  CO2 23 22 26 28   GLUCOSE 101* 191* 96 73  BUN 19 14 31* 28*  CREATININE 0.83 0.64 0.73 0.52  CALCIUM  8.3* 7.9* 8.4* 8.3*  MG  --   --  2.3 2.3  PHOS  --   --  3.7 4.6   GFR: Estimated Creatinine Clearance: 47.2 mL/min (by C-G formula based on SCr of 0.52 mg/dL). Liver Function Tests: Recent Labs  Lab 06/08/23 1820  AST 20  ALT 22  ALKPHOS 50  BILITOT 0.5  PROT 6.1*  ALBUMIN 3.4*   No results for input(s): LIPASE, AMYLASE in the last 168 hours. No results for input(s): AMMONIA in the last 168 hours. Coagulation Profile: Recent Labs  Lab 06/08/23 1820  INR 1.0   Cardiac Enzymes: No results for input(s): CKTOTAL, CKMB, CKMBINDEX, TROPONINI in the last 168 hours. BNP (last 3 results) No results for input(s): PROBNP in the last 8760 hours. HbA1C: No results for input(s): HGBA1C in the last 72 hours. CBG: Recent Labs  Lab 06/10/23 1533 06/10/23 1704 06/13/23 0813 06/13/23 0836 06/13/23 1103  GLUCAP 168* 168* 58* 101* 89   Lipid Profile: No results for input(s): CHOL, HDL, LDLCALC, TRIG, CHOLHDL, LDLDIRECT in the last 72 hours. Thyroid Function Tests: No results for input(s): TSH, T4TOTAL, FREET4, T3FREE, THYROIDAB in the last 72 hours. Anemia Panel: No results for input(s): VITAMINB12, FOLATE, FERRITIN, TIBC, IRON , RETICCTPCT in the last 72 hours. Sepsis Labs: Recent Labs  Lab 06/08/23 1821 06/08/23 2053  LATICACIDVEN 0.7 0.9    Recent Results  (from the past 240 hours)  Resp panel by RT-PCR (RSV, Flu A&B, Covid) Anterior Nasal Swab     Status: None   Collection Time: 06/08/23  6:28 PM   Specimen: Anterior Nasal Swab  Result Value Ref Range Status   SARS Coronavirus 2 by RT PCR NEGATIVE NEGATIVE Final    Comment: (NOTE) SARS-CoV-2 target nucleic acids are NOT DETECTED.  The SARS-CoV-2 RNA is generally detectable in upper respiratory specimens during the acute phase of infection. The lowest concentration of SARS-CoV-2 viral copies this assay can detect is 138 copies/mL. A negative result does not preclude SARS-Cov-2 infection and should not be used as the sole basis for treatment or other patient management decisions. A negative result may occur with  improper specimen collection/handling, submission of specimen other than nasopharyngeal swab, presence of viral mutation(s) within the areas targeted by this assay, and inadequate number of viral copies(<138 copies/mL). A negative result must be combined with clinical observations, patient history, and epidemiological information. The expected result  is Negative.  Fact Sheet for Patients:  bloggercourse.com  Fact Sheet for Healthcare Providers:  seriousbroker.it  This test is no t yet approved or cleared by the [place]  FDA and  has been authorized for detection and/or diagnosis of SARS-CoV-2 by FDA under an Emergency Use Authorization (EUA). This EUA will remain  in effect (meaning this test can be used) for the duration of the COVID-19 declaration under Section 564(b)(1) of the Act, 21 U.S.C.section 360bbb-3(b)(1), unless the authorization is terminated  or revoked sooner.       Influenza A by PCR NEGATIVE NEGATIVE Final   Influenza B by PCR NEGATIVE NEGATIVE Final    Comment: (NOTE) The Xpert Xpress SARS-CoV-2/FLU/RSV plus assay is intended as an aid in the diagnosis of influenza from Nasopharyngeal swab  specimens and should not be used as a sole basis for treatment. Nasal washings and aspirates are unacceptable for Xpert Xpress SARS-CoV-2/FLU/RSV testing.  Fact Sheet for Patients: bloggercourse.com  Fact Sheet for Healthcare Providers: seriousbroker.it  This test is not yet approved or cleared by the [place]  FDA and has been authorized for detection and/or diagnosis of SARS-CoV-2 by FDA under an Emergency Use Authorization (EUA). This EUA will remain in effect (meaning this test can be used) for the duration of the COVID-19 declaration under Section 564(b)(1) of the Act, 21 U.S.C. section 360bbb-3(b)(1), unless the authorization is terminated or revoked.     Resp Syncytial Virus by PCR NEGATIVE NEGATIVE Final    Comment: (NOTE) Fact Sheet for Patients: bloggercourse.com  Fact Sheet for Healthcare Providers: seriousbroker.it  This test is not yet approved or cleared by the [place]  FDA and has been authorized for detection and/or diagnosis of SARS-CoV-2 by FDA under an Emergency Use Authorization (EUA). This EUA will remain in effect (meaning this test can be used) for the duration of the COVID-19 declaration under Section 564(b)(1) of the Act, 21 U.S.C. section 360bbb-3(b)(1), unless the authorization is terminated or revoked.  Performed at [facility], [address] [place], [place] [address]   Blood Culture (routine x 2)     Status: None   Collection Time: 06/08/23  6:30 PM   Specimen: BLOOD  Result Value Ref Range Status   Specimen Description BLOOD LEFT ASSIST CONTROL  Final   Special Requests   Final    BOTTLES DRAWN AEROBIC AND ANAEROBIC Blood Culture adequate volume   Culture   Final    NO GROWTH 5 DAYS Performed at [facility], [address] [place], [place] [address]    Report Status 06/13/2023 FINAL  Final  Blood Culture  (routine x 2)     Status: None   Collection Time: 06/08/23  6:36 PM   Specimen: BLOOD  Result Value Ref Range Status   Specimen Description BLOOD RIGHT ANTECUBITAL  Final   Special Requests BOTTLES DRAWN AEROBIC AND ANAEROBIC BCAV  Final   Culture   Final    NO GROWTH 5 DAYS Performed at [facility], [address] [place], [place] [address]    Report Status 06/13/2023 FINAL  Final  Respiratory (~20 pathogens) panel by PCR     Status: None   Collection Time: 06/10/23 10:30 PM   Specimen: Nasopharyngeal Swab; Respiratory  Result Value Ref Range Status   Adenovirus NOT DETECTED NOT DETECTED Final   Coronavirus 229E NOT DETECTED NOT DETECTED Final    Comment: (NOTE) The Coronavirus on the Respiratory Panel, DOES NOT test for the novel  Coronavirus (2019 nCoV)  Coronavirus HKU1 NOT DETECTED NOT DETECTED Final   Coronavirus NL63 NOT DETECTED NOT DETECTED Final   Coronavirus OC43 NOT DETECTED NOT DETECTED Final   Metapneumovirus NOT DETECTED NOT DETECTED Final   Rhinovirus / Enterovirus NOT DETECTED NOT DETECTED Final   Influenza A NOT DETECTED NOT DETECTED Final   Influenza B NOT DETECTED NOT DETECTED Final   Parainfluenza Virus 1 NOT DETECTED NOT DETECTED Final   Parainfluenza Virus 2 NOT DETECTED NOT DETECTED Final   Parainfluenza Virus 3 NOT DETECTED NOT DETECTED Final   Parainfluenza Virus 4 NOT DETECTED NOT DETECTED Final   Respiratory Syncytial Virus NOT DETECTED NOT DETECTED Final   Bordetella pertussis NOT DETECTED NOT DETECTED Final   Bordetella Parapertussis NOT DETECTED NOT DETECTED Final   Chlamydophila pneumoniae NOT DETECTED NOT DETECTED Final   Mycoplasma pneumoniae NOT DETECTED NOT DETECTED Final    Comment: Performed at [facility], [address], [place], [place] [address]    Radiology Studies: No results found.  Scheduled Meds:  amLODipine   10 mg Oral Daily   aspirin  EC  81 mg Oral Daily   atorvastatin   80 mg Oral Daily   citalopram    20 mg Oral Daily   cyanocobalamin   1,000 mcg Oral Daily   enoxaparin  (LOVENOX ) injection  40 mg Subcutaneous Q24H   feeding supplement  237 mL Oral BID BM   ferrous sulfate   325 mg Oral QAC breakfast   fluticasone  furoate-vilanterol  1 puff Inhalation Daily   guaiFENesin   20 mL Oral TID   ipratropium-albuterol   3 mL Nebulization TID   isosorbide  mononitrate  15 mg Oral Daily   ketorolac   15 mg Intravenous Q8H   lidocaine   1 patch Transdermal Q24H   losartan   100 mg Oral Daily   metoprolol  succinate  25 mg Oral Daily   multivitamin with minerals  1 tablet Oral Daily   pantoprazole   40 mg Oral Daily   predniSONE   40 mg Oral Q breakfast   Followed by   NOREEN ON 06/16/2023] predniSONE   30 mg Oral Q breakfast   Followed by   NOREEN ON 06/19/2023] predniSONE   20 mg Oral Q breakfast   Followed by   NOREEN ON 06/22/2023] predniSONE   10 mg Oral Q breakfast   ticagrelor   90 mg Oral BID   Vitamin D  (Ergocalciferol )  50,000 Units Oral Q7 days   Continuous Infusions:    LOS: 5 days    Time spent: 35 minutes  Elvan Sor, MD [facility]   If 7PM-7AM, please contact night-coverage  06/14/2023, 2:29 PM

## 2023-06-15 DIAGNOSIS — J189 Pneumonia, unspecified organism: Secondary | ICD-10-CM | POA: Diagnosis not present

## 2023-06-15 LAB — GLUCOSE, CAPILLARY: Glucose-Capillary: 127 mg/dL — ABNORMAL HIGH (ref 70–99)

## 2023-06-15 NOTE — Plan of Care (Signed)

## 2023-06-15 NOTE — Progress Notes (Signed)
 PROGRESS NOTE    Terri Burns  FMW:[id number] DOB: 10/25/1950 DOA: 06/08/2023 PCP: Sampson Ethridge LABOR, MD    Brief Narrative:  73 y.o. female with medical history significant for COPD, GERD, hypertension, dyslipidemia and depression, who presented to the emergency room with a [place] of worsening dyspnea with associated cough productive of creamy sputum as well as wheezing since yesterday with no fever or chills.  He denied any chest pain or palpitations.  No dysuria, oliguria or hematuria or flank pain.  No nausea or vomiting or abdominal pain.  The patient was initially in significant respiratory distress she was placed on CPAP and was later on tapered to 3 L of O2 by nasal cannula.    Assessment & Plan:   Principal Problem:   CAP (community acquired pneumonia) Active Problems:   COPD exacerbation (HCC)   Right lower lobe pneumonia   Acute respiratory failure with hypoxia (HCC)   Pulmonary nodule   GERD without esophagitis   Benign essential HTN   Depression   Dyslipidemia   Coronary artery disease   Acute hypoxic respiratory failure (HCC)   Community acquired pneumonia of right lower lobe of lung  # COPD exacerbation # Community-acquired pneumonia Likely that infectious process exacerbated COPD.  Patient is hemodynamically stable and appears to be slowly improving. Plan: S/p IV Solu-Medrol  125 x 1 dose, BID x 2 days, daily x 2 days  Started prednisone  tapering dose from 1/7 S/p Rocephin  and azithromycin , completed course Continue Brovana  nebulizer twice daily and DuoNeb every 6 hourly scheduled Wean oxygen as tolerated  # Acute hypoxic respiratory failure due to PNA and copd Management as above Wean oxygen as tolerated  # Pulmonary nodules Outpatient pulmonary follow-up  # GERD: PPI  # CAD PTA aspirin  and Brilinta  PTA Toprol  and Imdur   # Hyperlipidemia PTA statin  # Depression PTA Celexa   # Hypertension BP controlled  # Functional decline Therapy  evaluations  # Vitamin D  deficiency: started vitamin D  50,000 units p.o. weekly, follow with PCP to repeat vitamin D  level after 3 to 6 months.  # Iron  deficiency anemia, transferrin saturation 6%.  Continue oral iron  supplement. # Vitamin B12 level 206, goal >400.  Continue B12 supplement.  Follow with PCP to repeat labs after 3 to 6 months.   DVT prophylaxis: SQ Lovenox  Code Status: Full Family Communication: Left VM for son Riva [phone] on 1/4 Disposition Plan: Status is: Inpatient Remains inpatient appropriate because: COPD exacerbation   Level of care: Telemetry Medical  Consultants:  None  Procedures:  None  Antimicrobials: Rocephin  Azithromycin    Subjective: No significant events overnight, still has chest congestion, mild productive cough.  Overall feels improvement, no any chest pain or palpitation, no any other active issues. Still patient is on 1 L oxygen, trying to gradually wean off  Objective: Vitals:   06/14/23 2039 06/15/23 0346 06/15/23 0728 06/15/23 0752  BP:  113/77 138/82   Pulse:  66 71   Resp:  18 16   Temp:  98.1 F (36.7 C) 98 F (36.7 C)   TempSrc:  Oral Oral   SpO2: 99% 100% 100% 98%  Weight:      Height:        Intake/Output Summary (Last 24 hours) at 06/15/2023 1427 Last data filed at 06/15/2023 1425 Gross per 24 hour  Intake 480 ml  Output --  Net 480 ml    Filed Weights   06/08/23 1801 06/09/23 2100  Weight: 56.2 kg 54.7 kg  Examination:  General exam: Appears fatigued and chronically ill Respiratory system: Equal air entry bilaterally, bilateral crackles and mild wheezing bilaterally. Cardiovascular system: S1-S2, RRR, no murmurs, no pedal edema Gastrointestinal system: Soft, NT/ND, normal bowel sounds Central nervous system: Alert and oriented. No focal neurological deficits. Extremities: Symmetrically decreased power Skin: No rashes, lesions or ulcers Psychiatry: Judgement and insight appear normal. Mood & affect  appropriate.     Data Reviewed: I have personally reviewed following labs and imaging studies  CBC: Recent Labs  Lab 06/08/23 1820 06/09/23 0350 06/12/23 1044 06/13/23 0509  WBC 7.4 4.3 10.1 7.2  NEUTROABS 5.4  --   --   --   HGB 8.6* 8.4* 8.7* 8.1*  HCT 28.2* 27.5* 28.1* 25.7*  MCV 87.0 86.8 85.2 84.0  PLT 219 210 283 268   Basic Metabolic Panel: Recent Labs  Lab 06/08/23 1820 06/09/23 0350 06/12/23 1044 06/13/23 0509  NA 132* 133* 135 135  K 5.2* 4.8 4.7 4.8  CL 98 103 99 100  CO2 23 22 26 28   GLUCOSE 101* 191* 96 73  BUN 19 14 31* 28*  CREATININE 0.83 0.64 0.73 0.52  CALCIUM  8.3* 7.9* 8.4* 8.3*  MG  --   --  2.3 2.3  PHOS  --   --  3.7 4.6   GFR: Estimated Creatinine Clearance: 47.2 mL/min (by C-G formula based on SCr of 0.52 mg/dL). Liver Function Tests: Recent Labs  Lab 06/08/23 1820  AST 20  ALT 22  ALKPHOS 50  BILITOT 0.5  PROT 6.1*  ALBUMIN 3.4*   No results for input(s): LIPASE, AMYLASE in the last 168 hours. No results for input(s): AMMONIA in the last 168 hours. Coagulation Profile: Recent Labs  Lab 06/08/23 1820  INR 1.0   Cardiac Enzymes: No results for input(s): CKTOTAL, CKMB, CKMBINDEX, TROPONINI in the last 168 hours. BNP (last 3 results) No results for input(s): PROBNP in the last 8760 hours. HbA1C: No results for input(s): HGBA1C in the last 72 hours. CBG: Recent Labs  Lab 06/10/23 1704 06/13/23 0813 06/13/23 0836 06/13/23 1103 06/14/23 2231  GLUCAP 168* 58* 101* 89 204*   Lipid Profile: No results for input(s): CHOL, HDL, LDLCALC, TRIG, CHOLHDL, LDLDIRECT in the last 72 hours. Thyroid Function Tests: No results for input(s): TSH, T4TOTAL, FREET4, T3FREE, THYROIDAB in the last 72 hours. Anemia Panel: No results for input(s): VITAMINB12, FOLATE, FERRITIN, TIBC, IRON , RETICCTPCT in the last 72 hours. Sepsis Labs: Recent Labs  Lab 06/08/23 1821 06/08/23 2053   LATICACIDVEN 0.7 0.9    Recent Results (from the past 240 hours)  Resp panel by RT-PCR (RSV, Flu A&B, Covid) Anterior Nasal Swab     Status: None   Collection Time: 06/08/23  6:28 PM   Specimen: Anterior Nasal Swab  Result Value Ref Range Status   SARS Coronavirus 2 by RT PCR NEGATIVE NEGATIVE Final    Comment: (NOTE) SARS-CoV-2 target nucleic acids are NOT DETECTED.  The SARS-CoV-2 RNA is generally detectable in upper respiratory specimens during the acute phase of infection. The lowest concentration of SARS-CoV-2 viral copies this assay can detect is 138 copies/mL. A negative result does not preclude SARS-Cov-2 infection and should not be used as the sole basis for treatment or other patient management decisions. A negative result may occur with  improper specimen collection/handling, submission of specimen other than nasopharyngeal swab, presence of viral mutation(s) within the areas targeted by this assay, and inadequate number of viral copies(<138 copies/mL). A negative result must be  combined with clinical observations, patient history, and epidemiological information. The expected result is Negative.  Fact Sheet for Patients:  bloggercourse.com  Fact Sheet for Healthcare Providers:  seriousbroker.it  This test is no t yet approved or cleared by the [place]  FDA and  has been authorized for detection and/or diagnosis of SARS-CoV-2 by FDA under an Emergency Use Authorization (EUA). This EUA will remain  in effect (meaning this test can be used) for the duration of the COVID-19 declaration under Section 564(b)(1) of the Act, 21 U.S.C.section 360bbb-3(b)(1), unless the authorization is terminated  or revoked sooner.       Influenza A by PCR NEGATIVE NEGATIVE Final   Influenza B by PCR NEGATIVE NEGATIVE Final    Comment: (NOTE) The Xpert Xpress SARS-CoV-2/FLU/RSV plus assay is intended as an aid in the diagnosis of  influenza from Nasopharyngeal swab specimens and should not be used as a sole basis for treatment. Nasal washings and aspirates are unacceptable for Xpert Xpress SARS-CoV-2/FLU/RSV testing.  Fact Sheet for Patients: bloggercourse.com  Fact Sheet for Healthcare Providers: seriousbroker.it  This test is not yet approved or cleared by the [place]  FDA and has been authorized for detection and/or diagnosis of SARS-CoV-2 by FDA under an Emergency Use Authorization (EUA). This EUA will remain in effect (meaning this test can be used) for the duration of the COVID-19 declaration under Section 564(b)(1) of the Act, 21 U.S.C. section 360bbb-3(b)(1), unless the authorization is terminated or revoked.     Resp Syncytial Virus by PCR NEGATIVE NEGATIVE Final    Comment: (NOTE) Fact Sheet for Patients: bloggercourse.com  Fact Sheet for Healthcare Providers: seriousbroker.it  This test is not yet approved or cleared by the [place]  FDA and has been authorized for detection and/or diagnosis of SARS-CoV-2 by FDA under an Emergency Use Authorization (EUA). This EUA will remain in effect (meaning this test can be used) for the duration of the COVID-19 declaration under Section 564(b)(1) of the Act, 21 U.S.C. section 360bbb-3(b)(1), unless the authorization is terminated or revoked.  Performed at [facility], [address] [place], [place] [address]   Blood Culture (routine x 2)     Status: None   Collection Time: 06/08/23  6:30 PM   Specimen: BLOOD  Result Value Ref Range Status   Specimen Description BLOOD LEFT ASSIST CONTROL  Final   Special Requests   Final    BOTTLES DRAWN AEROBIC AND ANAEROBIC Blood Culture adequate volume   Culture   Final    NO GROWTH 5 DAYS Performed at [facility], [address] [place], [place] [address]    Report Status  06/13/2023 FINAL  Final  Blood Culture (routine x 2)     Status: None   Collection Time: 06/08/23  6:36 PM   Specimen: BLOOD  Result Value Ref Range Status   Specimen Description BLOOD RIGHT ANTECUBITAL  Final   Special Requests BOTTLES DRAWN AEROBIC AND ANAEROBIC BCAV  Final   Culture   Final    NO GROWTH 5 DAYS Performed at [facility], [address] [place], [place] [address]    Report Status 06/13/2023 FINAL  Final  Respiratory (~20 pathogens) panel by PCR     Status: None   Collection Time: 06/10/23 10:30 PM   Specimen: Nasopharyngeal Swab; Respiratory  Result Value Ref Range Status   Adenovirus NOT DETECTED NOT DETECTED Final   Coronavirus 229E NOT DETECTED NOT DETECTED Final    Comment: (NOTE) The Coronavirus on the  Respiratory Panel, DOES NOT test for the novel  Coronavirus (2019 nCoV)    Coronavirus HKU1 NOT DETECTED NOT DETECTED Final   Coronavirus NL63 NOT DETECTED NOT DETECTED Final   Coronavirus OC43 NOT DETECTED NOT DETECTED Final   Metapneumovirus NOT DETECTED NOT DETECTED Final   Rhinovirus / Enterovirus NOT DETECTED NOT DETECTED Final   Influenza A NOT DETECTED NOT DETECTED Final   Influenza B NOT DETECTED NOT DETECTED Final   Parainfluenza Virus 1 NOT DETECTED NOT DETECTED Final   Parainfluenza Virus 2 NOT DETECTED NOT DETECTED Final   Parainfluenza Virus 3 NOT DETECTED NOT DETECTED Final   Parainfluenza Virus 4 NOT DETECTED NOT DETECTED Final   Respiratory Syncytial Virus NOT DETECTED NOT DETECTED Final   Bordetella pertussis NOT DETECTED NOT DETECTED Final   Bordetella Parapertussis NOT DETECTED NOT DETECTED Final   Chlamydophila pneumoniae NOT DETECTED NOT DETECTED Final   Mycoplasma pneumoniae NOT DETECTED NOT DETECTED Final    Comment: Performed at [facility], [address], [place], [place] [address]    Radiology Studies: No results found.  Scheduled Meds:  amLODipine   10 mg Oral Daily   aspirin  EC  81 mg Oral Daily    atorvastatin   80 mg Oral Daily   citalopram   20 mg Oral Daily   cyanocobalamin   1,000 mcg Oral Daily   enoxaparin  (LOVENOX ) injection  40 mg Subcutaneous Q24H   feeding supplement  237 mL Oral BID BM   ferrous sulfate   325 mg Oral QAC breakfast   fluticasone  furoate-vilanterol  1 puff Inhalation Daily   guaiFENesin   20 mL Oral TID   ipratropium-albuterol   3 mL Nebulization TID   isosorbide  mononitrate  15 mg Oral Daily   lidocaine   1 patch Transdermal Q24H   losartan   100 mg Oral Daily   metoprolol  succinate  25 mg Oral Daily   multivitamin with minerals  1 tablet Oral Daily   pantoprazole   40 mg Oral Daily   [START ON 06/16/2023] predniSONE   30 mg Oral Q breakfast   Followed by   NOREEN ON 06/19/2023] predniSONE   20 mg Oral Q breakfast   Followed by   NOREEN ON 06/22/2023] predniSONE   10 mg Oral Q breakfast   ticagrelor   90 mg Oral BID   Vitamin D  (Ergocalciferol )  50,000 Units Oral Q7 days   Continuous Infusions:    LOS: 6 days    Time spent: 35 minutes  Elvan Sor, MD [facility]   If 7PM-7AM, please contact night-coverage  06/15/2023, 2:27 PM

## 2023-06-16 ENCOUNTER — Inpatient Hospital Stay: Payer: 59

## 2023-06-16 DIAGNOSIS — J189 Pneumonia, unspecified organism: Secondary | ICD-10-CM | POA: Diagnosis not present

## 2023-06-16 LAB — MAGNESIUM: Magnesium: 2.3 mg/dL (ref 1.7–2.4)

## 2023-06-16 LAB — BASIC METABOLIC PANEL
Anion gap: 7 (ref 5–15)
BUN: 28 mg/dL — ABNORMAL HIGH (ref 8–23)
CO2: 23 mmol/L (ref 22–32)
Calcium: 8.1 mg/dL — ABNORMAL LOW (ref 8.9–10.3)
Chloride: 104 mmol/L (ref 98–111)
Creatinine, Ser: 0.71 mg/dL (ref 0.44–1.00)
GFR, Estimated: >60 mL/min (ref >60)
Glucose, Bld: 120 mg/dL — ABNORMAL HIGH (ref 70–99)
Potassium: 4.1 mmol/L (ref 3.5–5.1)
Sodium: 134 mmol/L — ABNORMAL LOW (ref 135–145)

## 2023-06-16 LAB — CBC
HCT: 26.6 % — ABNORMAL LOW (ref 36.0–46.0)
Hemoglobin: 8.6 g/dL — ABNORMAL LOW (ref 12.0–15.0)
MCH: 26.6 pg (ref 26.0–34.0)
MCHC: 32.3 g/dL (ref 30.0–36.0)
MCV: 82.4 fL (ref 80.0–100.0)
Platelets: 379 10*3/uL (ref 150–400)
RBC: 3.23 MIL/uL — ABNORMAL LOW (ref 3.87–5.11)
RDW: 23.1 % — ABNORMAL HIGH (ref 11.5–15.5)
WBC: 9.2 10*3/uL (ref 4.0–10.5)
nRBC: 0 % (ref 0.0–0.2)

## 2023-06-16 LAB — PHOSPHORUS: Phosphorus: 3.6 mg/dL (ref 2.5–4.6)

## 2023-06-16 MED ORDER — ARFORMOTEROL TARTRATE 15 MCG/2ML IN NEBU
15.0000 ug | INHALATION_SOLUTION | Freq: Two times a day (BID) | RESPIRATORY_TRACT | Status: DC
  Administered 2023-06-16 – 2023-06-20 (×9): 15 ug via RESPIRATORY_TRACT
  Filled 2023-06-16 (×10): qty 2

## 2023-06-16 MED ORDER — IPRATROPIUM-ALBUTEROL 0.5-2.5 (3) MG/3ML IN SOLN
3.0000 mL | Freq: Four times a day (QID) | RESPIRATORY_TRACT | Status: DC
  Administered 2023-06-16 – 2023-06-19 (×11): 3 mL via RESPIRATORY_TRACT
  Filled 2023-06-16 (×11): qty 3

## 2023-06-16 MED ORDER — BUDESONIDE 0.25 MG/2ML IN SUSP
0.2500 mg | Freq: Two times a day (BID) | RESPIRATORY_TRACT | Status: DC
  Administered 2023-06-16 – 2023-06-20 (×9): 0.25 mg via RESPIRATORY_TRACT
  Filled 2023-06-16 (×10): qty 2

## 2023-06-16 MED ORDER — PREDNISONE 20 MG PO TABS
40.0000 mg | ORAL_TABLET | Freq: Every day | ORAL | Status: DC
  Administered 2023-06-17 – 2023-06-20 (×4): 40 mg via ORAL
  Filled 2023-06-16 (×4): qty 2

## 2023-06-16 NOTE — Progress Notes (Signed)
 PROGRESS NOTE    Terri Burns  FMW:[id number] DOB: 1950/09/23 DOA: 06/08/2023 PCP: Sampson Ethridge LABOR, MD    Brief Narrative:   73 y.o. female with medical history significant for COPD, GERD, hypertension, dyslipidemia and depression, who presented to the emergency room with a chief complaint of worsening dyspnea with associated cough productive of creamy sputum as well as wheezing since yesterday with no fever or chills.  He denied any chest pain or palpitations.  No dysuria, oliguria or hematuria or flank pain.  No nausea or vomiting or abdominal pain.  The patient was initially in significant respiratory distress she was placed on CPAP and was later on tapered to 3 L of O2 by nasal cannula.      Assessment & Plan:   Principal Problem:   CAP (community acquired pneumonia) Active Problems:   COPD exacerbation (HCC)   Right lower lobe pneumonia   Acute respiratory failure with hypoxia (HCC)   Pulmonary nodule   GERD without esophagitis   Benign essential HTN   Depression   Dyslipidemia   Coronary artery disease   Acute hypoxic respiratory failure (HCC)   Community acquired pneumonia of right lower lobe of lung  # COPD exacerbation # Community-acquired pneumonia Patient was initially improving however on 1/10 respiratory status appears to have worsened.  More wheezing and rhonchorous breath sounds. Plan: Restart IV steroids.  Solu-Medrol  40 mg twice daily Escalate bronchodilator regimen Continue oxygen, wean as tolerated Recheck chest x-ray Consider MetaNeb if patient not responding over the next 24 hours   # Acute hypoxic respiratory failure due to PNA and copd Management as above Wean oxygen as tolerated   # Pulmonary nodules Outpatient pulmonary follow-up   # GERD: PPI   # CAD PTA aspirin  and Brilinta  PTA Toprol  and Imdur    # Hyperlipidemia PTA statin   # Depression PTA Celexa    # Hypertension BP controlled   # Functional decline Therapy  evaluations Rec SNF.  Patient declining   # Vitamin D  deficiency: started vitamin D  50,000 units p.o. weekly, follow with PCP to repeat vitamin D  level after 3 to 6 months.   # Iron  deficiency anemia, transferrin saturation 6%.  Continue oral iron  supplement.  # Vitamin B12 level 206, goal >400.  Continue B12 supplement.  Follow with PCP to repeat labs after 3 to 6 months.   DVT prophylaxis: SQ Lovenox  Code Status: Full Family Communication: None Disposition Plan: Status is: Inpatient Remains inpatient appropriate because: Persistent hypoxia and slow to improve COPD   Level of care: Telemetry Medical  Consultants:  None  Procedures:  None  Antimicrobials: None   Subjective: Seen and examined.  Sitting up in chair.  Continues to feel winded and short of breath.  States she feels worse today than she did yesterday.  Objective: Vitals:   06/15/23 2200 06/16/23 0403 06/16/23 0813 06/16/23 0822  BP:  131/82  (!) 149/80  Pulse:  76  72  Resp:  16  16  Temp:  98 F (36.7 C)  98.2 F (36.8 C)  TempSrc:  Oral  Oral  SpO2: 99% 100% 99% 100%  Weight:      Height:        Intake/Output Summary (Last 24 hours) at 06/16/2023 1409 Last data filed at 06/16/2023 0900 Gross per 24 hour  Intake 1080 ml  Output --  Net 1080 ml   Filed Weights   06/08/23 1801 06/09/23 2100  Weight: 56.2 kg 54.7 kg    Examination:  General exam: Appears fatigued and deconditioned Respiratory system: Scattered crackles and end expiratory wheeze in all lung fields.  1 L Cardiovascular system: S1-S2, RRR, no murmurs, no pedal edema Gastrointestinal system: Soft, NT/ND, normal bowel sounds Central nervous system: Alert and oriented. No focal neurological deficits. Extremities: Symmetric 5 x 5 power. Skin: No rashes, lesions or ulcers Psychiatry: Judgement and insight appear normal. Mood & affect appropriate.     Data Reviewed: I have personally reviewed following labs and imaging  studies  CBC: Recent Labs  Lab 06/12/23 1044 06/13/23 0509 06/16/23 0429  WBC 10.1 7.2 9.2  HGB 8.7* 8.1* 8.6*  HCT 28.1* 25.7* 26.6*  MCV 85.2 84.0 82.4  PLT 283 268 379   Basic Metabolic Panel: Recent Labs  Lab 06/12/23 1044 06/13/23 0509 06/16/23 0429  NA 135 135 134*  K 4.7 4.8 4.1  CL 99 100 104  CO2 26 28 23   GLUCOSE 96 73 120*  BUN 31* 28* 28*  CREATININE 0.73 0.52 0.71  CALCIUM  8.4* 8.3* 8.1*  MG 2.3 2.3 2.3  PHOS 3.7 4.6 3.6   GFR: Estimated Creatinine Clearance: 47.2 mL/min (by C-G formula based on SCr of 0.71 mg/dL). Liver Function Tests: No results for input(s): AST, ALT, ALKPHOS, BILITOT, PROT, ALBUMIN in the last 168 hours. No results for input(s): LIPASE, AMYLASE in the last 168 hours. No results for input(s): AMMONIA in the last 168 hours. Coagulation Profile: No results for input(s): INR, PROTIME in the last 168 hours. Cardiac Enzymes: No results for input(s): CKTOTAL, CKMB, CKMBINDEX, TROPONINI in the last 168 hours. BNP (last 3 results) No results for input(s): PROBNP in the last 8760 hours. HbA1C: No results for input(s): HGBA1C in the last 72 hours. CBG: Recent Labs  Lab 06/13/23 0813 06/13/23 0836 06/13/23 1103 06/14/23 2231 06/15/23 1741  GLUCAP 58* 101* 89 204* 127*   Lipid Profile: No results for input(s): CHOL, HDL, LDLCALC, TRIG, CHOLHDL, LDLDIRECT in the last 72 hours. Thyroid Function Tests: No results for input(s): TSH, T4TOTAL, FREET4, T3FREE, THYROIDAB in the last 72 hours. Anemia Panel: No results for input(s): VITAMINB12, FOLATE, FERRITIN, TIBC, IRON , RETICCTPCT in the last 72 hours. Sepsis Labs: No results for input(s): PROCALCITON, LATICACIDVEN in the last 168 hours.  Recent Results (from the past 240 hours)  Resp panel by RT-PCR (RSV, Flu A&B, Covid) Anterior Nasal Swab     Status: None   Collection Time: 06/08/23  6:28 PM   Specimen: Anterior  Nasal Swab  Result Value Ref Range Status   SARS Coronavirus 2 by RT PCR NEGATIVE NEGATIVE Final    Comment: (NOTE) SARS-CoV-2 target nucleic acids are NOT DETECTED.  The SARS-CoV-2 RNA is generally detectable in upper respiratory specimens during the acute phase of infection. The lowest concentration of SARS-CoV-2 viral copies this assay can detect is 138 copies/mL. A negative result does not preclude SARS-Cov-2 infection and should not be used as the sole basis for treatment or other patient management decisions. A negative result may occur with  improper specimen collection/handling, submission of specimen other than nasopharyngeal swab, presence of viral mutation(s) within the areas targeted by this assay, and inadequate number of viral copies(<138 copies/mL). A negative result must be combined with clinical observations, patient history, and epidemiological information. The expected result is Negative.  Fact Sheet for Patients:  bloggercourse.com  Fact Sheet for Healthcare Providers:  seriousbroker.it  This test is no t yet approved or cleared by the [place]  FDA and  has been authorized for detection  and/or diagnosis of SARS-CoV-2 by FDA under an Emergency Use Authorization (EUA). This EUA will remain  in effect (meaning this test can be used) for the duration of the COVID-19 declaration under Section 564(b)(1) of the Act, 21 U.S.C.section 360bbb-3(b)(1), unless the authorization is terminated  or revoked sooner.       Influenza A by PCR NEGATIVE NEGATIVE Final   Influenza B by PCR NEGATIVE NEGATIVE Final    Comment: (NOTE) The Xpert Xpress SARS-CoV-2/FLU/RSV plus assay is intended as an aid in the diagnosis of influenza from Nasopharyngeal swab specimens and should not be used as a sole basis for treatment. Nasal washings and aspirates are unacceptable for Xpert Xpress SARS-CoV-2/FLU/RSV testing.  Fact Sheet for  Patients: bloggercourse.com  Fact Sheet for Healthcare Providers: seriousbroker.it  This test is not yet approved or cleared by the [place]  FDA and has been authorized for detection and/or diagnosis of SARS-CoV-2 by FDA under an Emergency Use Authorization (EUA). This EUA will remain in effect (meaning this test can be used) for the duration of the COVID-19 declaration under Section 564(b)(1) of the Act, 21 U.S.C. section 360bbb-3(b)(1), unless the authorization is terminated or revoked.     Resp Syncytial Virus by PCR NEGATIVE NEGATIVE Final    Comment: (NOTE) Fact Sheet for Patients: bloggercourse.com  Fact Sheet for Healthcare Providers: seriousbroker.it  This test is not yet approved or cleared by the [place]  FDA and has been authorized for detection and/or diagnosis of SARS-CoV-2 by FDA under an Emergency Use Authorization (EUA). This EUA will remain in effect (meaning this test can be used) for the duration of the COVID-19 declaration under Section 564(b)(1) of the Act, 21 U.S.C. section 360bbb-3(b)(1), unless the authorization is terminated or revoked.  Performed at [facility], [address] [place], [place] [address]   Blood Culture (routine x 2)     Status: None   Collection Time: 06/08/23  6:30 PM   Specimen: BLOOD  Result Value Ref Range Status   Specimen Description BLOOD LEFT ASSIST CONTROL  Final   Special Requests   Final    BOTTLES DRAWN AEROBIC AND ANAEROBIC Blood Culture adequate volume   Culture   Final    NO GROWTH 5 DAYS Performed at [facility], [address] [place], [place] [address]    Report Status 06/13/2023 FINAL  Final  Blood Culture (routine x 2)     Status: None   Collection Time: 06/08/23  6:36 PM   Specimen: BLOOD  Result Value Ref Range Status   Specimen Description BLOOD RIGHT ANTECUBITAL  Final    Special Requests BOTTLES DRAWN AEROBIC AND ANAEROBIC BCAV  Final   Culture   Final    NO GROWTH 5 DAYS Performed at [facility], [address] [place], [place] [address]    Report Status 06/13/2023 FINAL  Final  Respiratory (~20 pathogens) panel by PCR     Status: None   Collection Time: 06/10/23 10:30 PM   Specimen: Nasopharyngeal Swab; Respiratory  Result Value Ref Range Status   Adenovirus NOT DETECTED NOT DETECTED Final   Coronavirus 229E NOT DETECTED NOT DETECTED Final    Comment: (NOTE) The Coronavirus on the Respiratory Panel, DOES NOT test for the novel  Coronavirus (2019 nCoV)    Coronavirus HKU1 NOT DETECTED NOT DETECTED Final   Coronavirus NL63 NOT DETECTED NOT DETECTED Final   Coronavirus OC43 NOT DETECTED NOT DETECTED Final   Metapneumovirus NOT DETECTED NOT DETECTED Final   Rhinovirus /  Enterovirus NOT DETECTED NOT DETECTED Final   Influenza A NOT DETECTED NOT DETECTED Final   Influenza B NOT DETECTED NOT DETECTED Final   Parainfluenza Virus 1 NOT DETECTED NOT DETECTED Final   Parainfluenza Virus 2 NOT DETECTED NOT DETECTED Final   Parainfluenza Virus 3 NOT DETECTED NOT DETECTED Final   Parainfluenza Virus 4 NOT DETECTED NOT DETECTED Final   Respiratory Syncytial Virus NOT DETECTED NOT DETECTED Final   Bordetella pertussis NOT DETECTED NOT DETECTED Final   Bordetella Parapertussis NOT DETECTED NOT DETECTED Final   Chlamydophila pneumoniae NOT DETECTED NOT DETECTED Final   Mycoplasma pneumoniae NOT DETECTED NOT DETECTED Final    Comment: Performed at [facility], [address] [place], [place] [address]         Radiology Studies: No results found.      Scheduled Meds:  amLODipine   10 mg Oral Daily   arformoterol   15 mcg Nebulization BID   aspirin  EC  81 mg Oral Daily   atorvastatin   80 mg Oral Daily   budesonide  (PULMICORT ) nebulizer solution  0.25 mg Nebulization BID   citalopram   20 mg Oral Daily   cyanocobalamin   1,000 mcg  Oral Daily   enoxaparin  (LOVENOX ) injection  40 mg Subcutaneous Q24H   feeding supplement  237 mL Oral BID BM   ferrous sulfate   325 mg Oral QAC breakfast   guaiFENesin   20 mL Oral TID   ipratropium-albuterol   3 mL Nebulization Q6H   isosorbide  mononitrate  15 mg Oral Daily   lidocaine   1 patch Transdermal Q24H   losartan   100 mg Oral Daily   metoprolol  succinate  25 mg Oral Daily   multivitamin with minerals  1 tablet Oral Daily   pantoprazole   40 mg Oral Daily   [START ON 06/17/2023] predniSONE   40 mg Oral Q breakfast   ticagrelor   90 mg Oral BID   Vitamin D  (Ergocalciferol )  50,000 Units Oral Q7 days   Continuous Infusions:   LOS: 7 days   Calvin KATHEE Robson, MD [facility]   If 7PM-7AM, please contact night-coverage  06/16/2023, 2:09 PM

## 2023-06-16 NOTE — Plan of Care (Signed)

## 2023-06-16 NOTE — Progress Notes (Signed)
 Physical Therapy Treatment Patient Details Name: Terri Burns MRN: [id number] DOB: 07/05/1950 Today's Date: 06/16/2023   History of Present Illness Pt is a 73 y/o F admitted on 06/08/23 after presenting with c/o worsening dyspnea with cough, sputum production, & wheezing. Pt is being treated for COPD exacerbation, RLL PNA. PMH: COPD, GERD, HTN, dyslipidemia, depression    PT Comments  Pt was pleasant and motivated to participate during the session and put forth good effort throughout. Pt able to amb 90 feet this session but required two static standing therapeutic rest breaks each around 60-90 sec.  Pt was generally steady with no overt LOB with transfers and gait but required BUE support on the RW for stability.  Pt's SpO2 and HR on 1LO2/min were both WNL throughout with SpO2 dropping to a low of 94-95% but typically >/= 96% during the session.  Pt will benefit from continued PT services upon discharge to safely address deficits listed in patient problem list for decreased caregiver assistance and eventual return to PLOF.      If plan is discharge home, recommend the following: A little help with walking and/or transfers;A little help with bathing/dressing/bathroom;Assist for transportation;Help with stairs or ramp for entrance;Assistance with cooking/housework   Can travel by private vehicle     Yes  Equipment Recommendations  None recommended by PT    Recommendations for Other Services       Precautions / Restrictions Precautions Precautions: Fall Restrictions Weight Bearing Restrictions Per Provider Order: No     Mobility  Bed Mobility Overal bed mobility: Modified Independent             General bed mobility comments: Min extra time and effort only    Transfers Overall transfer level: Needs assistance Equipment used: Rolling walker (2 wheels) Transfers: Sit to/from Stand Sit to Stand: Supervision           General transfer comment: Good control and stability  with no cues needed for hand placement this session    Ambulation/Gait Ambulation/Gait assistance: Supervision Gait Distance (Feet): 90 Feet Assistive device: Rolling walker (2 wheels) Gait Pattern/deviations: Decreased step length - right, Decreased step length - left, Trunk flexed, Step-through pattern Gait velocity: decreased     General Gait Details: Slow cadence with min verbal cues for sequencing with the RW; steady with no overt LOB   Stairs             Wheelchair Mobility     Tilt Bed    Modified Rankin (Stroke Patients Only)       Balance Overall balance assessment: Needs assistance Sitting-balance support: Feet supported, No upper extremity supported Sitting balance-Leahy Scale: Good     Standing balance support: Bilateral upper extremity supported, During functional activity Standing balance-Leahy Scale: Good                              Cognition Arousal: Alert Behavior During Therapy: WFL for tasks assessed/performed Overall Cognitive Status: Within Functional Limits for tasks assessed                                          Exercises      General Comments        Pertinent Vitals/Pain Pain Assessment Pain Assessment: 0-10 Pain Score: 6  Pain Location: Chronic back pain per patient Pain Descriptors /  Indicators: Sore, Aching Pain Intervention(s): Repositioned, Premedicated before session, Monitored during session    Home Living                          Prior Function            PT Goals (current goals can now be found in the care plan section) Progress towards PT goals: Progressing toward goals    Frequency    Min 1X/week      PT Plan      Co-evaluation              AM-PAC PT 6 Clicks Mobility   Outcome Measure  Help needed turning from your back to your side while in a flat bed without using bedrails?: None Help needed moving from lying on your back to sitting on the  side of a flat bed without using bedrails?: None Help needed moving to and from a bed to a chair (including a wheelchair)?: A Little Help needed standing up from a chair using your arms (e.g., wheelchair or bedside chair)?: A Little Help needed to walk in hospital room?: A Little Help needed climbing 3-5 steps with a railing? : A Little 6 Click Score: 20    End of Session Equipment Utilized During Treatment: Oxygen;Gait belt Activity Tolerance: Patient tolerated treatment well Patient left: with call bell/phone within reach;Other (comment) (Pt left on BSC for BM at end of session, nsg notified) Nurse Communication: Mobility status PT Visit Diagnosis: Muscle weakness (generalized) (M62.81);Unsteadiness on feet (R26.81);Difficulty in walking, not elsewhere classified (R26.2)     Time: 8977-8961 PT Time Calculation (min) (ACUTE ONLY): 16 min  Charges:    $Gait Training: 8-22 mins PT General Charges $$ ACUTE PT VISIT: 1 Visit                     D. Scott Arish Redner PT, DPT 06/16/23, 11:29 AM

## 2023-06-16 NOTE — TOC Progression Note (Signed)
 Transition of Care [facility]) - Progression Note    Patient Details  Name: Terri Burns MRN: [id number] Date of Birth: [birth year]  Transition of [facility]) CM/SW Contact  Corean ONEIDA Haddock, RN Phone Number: 06/16/2023, 2:24 PM  Clinical Narrative:     Patient still requiring acute O2 TOC to notify [facility] at discharge   Expected Discharge Plan: Home w Home Health Services Barriers to Discharge: Continued Medical Work up  Expected Discharge Plan and Services       Living arrangements for the past 2 months: Single Family Home                           HH Arranged: PT, OT [place] Agency: [facility] Date [facility] Agency Contacted: 06/10/23   Representative spoke with at [facility] Agency: [name]   Social Determinants of Health (SDOH) Interventions SDOH Screenings   Food Insecurity: No Food Insecurity (06/09/2023)  Housing: Low Risk  (06/09/2023)  Transportation Needs: No Transportation Needs (06/09/2023)  Utilities: Not At Risk (06/09/2023)  Alcohol Screen: Low Risk  (07/13/2021)  Depression (PHQ2-9): Low Risk  (05/11/2022)  Financial Resource Strain: Low Risk  (07/13/2021)  Physical Activity: Insufficiently Active (07/13/2021)  Social Connections: Socially Isolated (06/09/2023)  Stress: No Stress Concern Present (07/13/2021)  Tobacco Use: High Risk (06/10/2023)    Readmission Risk Interventions     No data to display

## 2023-06-16 NOTE — Progress Notes (Signed)
 Occupational Therapy Treatment Patient Details Name: Terri Burns MRN: [id number] DOB: November 05, 1950 Today's Date: 06/16/2023   History of present illness Pt is a 73 y/o F admitted on 06/08/23 after presenting with c/o worsening dyspnea with cough, sputum production, & wheezing. Pt is being treated for COPD exacerbation, RLL PNA. PMH: COPD, GERD, HTN, dyslipidemia, depression   OT comments  Terri Burns was seen for OT treatment on this date. Upon arrival to room pt supine in bed, agreeable to OT Tx session. OT facilitated ADL management with education and assist as described below. See ADL section for additional details regarding occupational performance. Pt continues to be functionally limited by generalized weakness, decreased cardiopulmonary status, and decreased activity tolerance. She requires CGA for safety with brief functional transfers and MIN-MOD A for LB ADL management from STS. Pt return verbalizes understanding of education provided t/o session. Pt is progressing toward OT goals and continues to benefit from skilled OT services to maximize return to PLOF and minimize risk of future falls, injury, caregiver burden, and readmission. Will continue to follow POC, DC recs remain appropriate.       If plan is discharge home, recommend the following:  A little help with walking and/or transfers;Assistance with cooking/housework;Assist for transportation;Help with stairs or ramp for entrance;A lot of help with bathing/dressing/bathroom   Equipment Recommendations  None recommended by OT    Recommendations for Other Services      Precautions / Restrictions Precautions Precautions: Fall Restrictions Weight Bearing Restrictions Per Provider Order: No       Mobility Bed Mobility               General bed mobility comments: NT pt in recliner at start/end of session    Transfers Overall transfer level: Needs assistance Equipment used: Rolling walker (2 wheels) Transfers: Sit  to/from Stand Sit to Stand: Supervision           General transfer comment: Supervision for marching in place and STS t/fs this date.     Balance Overall balance assessment: Needs assistance Sitting-balance support: Feet supported, No upper extremity supported Sitting balance-Leahy Scale: Good Sitting balance - Comments: generally maintains at least 1UE support on seated surface.   Standing balance support: During functional activity, No upper extremity supported Standing balance-Leahy Scale: Good Standing balance comment: briefly able to stand without UE support during functional activity.                           ADL either performed or assessed with clinical judgement   ADL Overall ADL's : Needs assistance/impaired Eating/Feeding: Sitting;Modified independent                   Lower Body Dressing: Sit to/from stand;Contact guard assist Lower Body Dressing Details (indicate cue type and reason): Simulated from recliner, able to STS x3. Static Stands for ~2 min without UE support.               General ADL Comments: Pt able to stand without UE support at sink for simulated UB grooming tasks. Provided with reinforcement of prior education on energy conservation strategies with emphasis this session on PLB techniques and activity pacing. Pt return demos understanding t/o session. Good recall/carryover noted from previous sessions. She requires SUPERVISION for static standing with and without UE support. Anticipate increased assistance for more exertional ADL management including LB bathing/dressing. Problem solved strategies to maximize safety and fxl independence with bathing upon DC home.  Discussed the 4 P's of energy conservation.    Extremity/Trunk Assessment Upper Extremity Assessment Upper Extremity Assessment: Generalized weakness   Lower Extremity Assessment Lower Extremity Assessment: Generalized weakness   Cervical / Trunk Assessment Cervical /  Trunk Assessment: Kyphotic Cervical / Trunk Exceptions: baseline R Lateral head Tilt while seated; Hx of Cervical Fx and scoliosis    Vision Patient Visual Report: No change from baseline     Perception     Praxis      Cognition Arousal: Alert Behavior During Therapy: WFL for tasks assessed/performed Overall Cognitive Status: Within Functional Limits for tasks assessed                                          Exercises Other Exercises Other Exercises: OT facilitated problem solving to help incorporate learned ECS including activty pacing and work simplification strategies into daily ADL/IADL/mobility routines to improve safety/independence.    Shoulder Instructions       General Comments Pt recieved on 1.5 L/min [place] spO2 96% at rest in recliner. spO2 maintains at 99-100%. Pt decreased to 1 L [place] and sustains stable spO2 >/= 98% during functional activity. O2 sats at end of session 97-98% on 1L. Pt left on 1L [place].    Pertinent Vitals/ Pain       Pain Assessment Pain Assessment: No/denies pain Pain Intervention(s): Premedicated before session, Monitored during session  Home Living                                          Prior Functioning/Environment              Frequency  Min 1X/week        Progress Toward Goals  OT Goals(current goals can now be found in the care plan section)  Progress towards OT goals: Progressing toward goals  Acute Rehab OT Goals Patient Stated Goal: To go home OT Goal Formulation: With patient Time For Goal Achievement: 06/23/23 Potential to Achieve Goals: Good  Plan      Co-evaluation                 AM-PAC OT 6 Clicks Daily Activity     Outcome Measure   Help from another person eating meals?: None Help from another person taking care of personal grooming?: A Little Help from another person toileting, which includes using toliet, bedpan, or urinal?: A Little Help from another person  bathing (including washing, rinsing, drying)?: A Little Help from another person to put on and taking off regular upper body clothing?: A Little Help from another person to put on and taking off regular lower body clothing?: A Little 6 Click Score: 19    End of Session Equipment Utilized During Treatment: Oxygen  OT Visit Diagnosis: Other abnormalities of gait and mobility (R26.89);Muscle weakness (generalized) (M62.81);Pain   Activity Tolerance Patient tolerated treatment well   Patient Left in bed;with call bell/phone within reach;with bed alarm set   Nurse Communication Mobility status        Time: 8692-8675 OT Time Calculation (min): 17 min  Charges: OT General Charges $OT Visit: 1 Visit OT Treatments $Self Care/Home Management : 8-22 mins  Jhonny Pelton, M.S., OTR/L 06/16/23, 2:14 PM

## 2023-06-17 DIAGNOSIS — J189 Pneumonia, unspecified organism: Secondary | ICD-10-CM | POA: Diagnosis not present

## 2023-06-17 NOTE — Plan of Care (Signed)

## 2023-06-17 NOTE — Progress Notes (Signed)
 PROGRESS NOTE    Terri Burns  FMW:[id number] DOB: Nov 18, 1950 DOA: 06/08/2023 PCP: Sampson Ethridge LABOR, MD    Brief Narrative:   73 y.o. female with medical history significant for COPD, GERD, hypertension, dyslipidemia and depression, who presented to the emergency room with a chief complaint of worsening dyspnea with associated cough productive of creamy sputum as well as wheezing since yesterday with no fever or chills.  He denied any chest pain or palpitations.  No dysuria, oliguria or hematuria or flank pain.  No nausea or vomiting or abdominal pain.  The patient was initially in significant respiratory distress she was placed on CPAP and was later on tapered to 3 L of O2 by nasal cannula.      Assessment & Plan:   Principal Problem:   CAP (community acquired pneumonia) Active Problems:   COPD exacerbation (HCC)   Right lower lobe pneumonia   Acute respiratory failure with hypoxia (HCC)   Pulmonary nodule   GERD without esophagitis   Benign essential HTN   Depression   Dyslipidemia   Coronary artery disease   Acute hypoxic respiratory failure (HCC)   Community acquired pneumonia of right lower lobe of lung  # COPD exacerbation # Community-acquired pneumonia Patient was initially improving however on 1/10 respiratory status appears to have worsened.  More wheezing and rhonchorous breath sounds.  Chest x-ray without acute findings Plan: Continue IV steroids Continue multimodal bronchodilator regimen Wean oxygen as tolerated If patient continues to be in respiratory distress will involve pulmonary for comment 1/13    # Acute hypoxic respiratory failure due to PNA and copd Management as above Wean oxygen as tolerated   # Pulmonary nodules Outpatient pulmonary follow-up   # GERD: PPI   # CAD PTA aspirin  and Brilinta  PTA Toprol  and Imdur    # Hyperlipidemia PTA statin   # Depression PTA Celexa    # Hypertension BP controlled   # Functional  decline Therapy evaluations Rec SNF.  Patient declining   # Vitamin D  deficiency: started vitamin D  50,000 units p.o. weekly, follow with PCP to repeat vitamin D  level after 3 to 6 months.   # Iron  deficiency anemia, transferrin saturation 6%.  Continue oral iron  supplement.  # Vitamin B12 level 206, goal >400.  Continue B12 supplement.  Follow with PCP to repeat labs after 3 to 6 months.   DVT prophylaxis: SQ Lovenox  Code Status: Full Family Communication: None Disposition Plan: Status is: Inpatient Remains inpatient appropriate because: Persistent hypoxia and slow to improve COPD   Level of care: Telemetry Medical  Consultants:  None  Procedures:  None  Antimicrobials: None   Subjective: Seen and examined.  Lying in bed.  Continues to feel very dyspneic with minimal exertion.  Objective: Vitals:   06/17/23 0353 06/17/23 0750 06/17/23 0802 06/17/23 1148  BP: 132/70 (!) 142/81  107/65  Pulse: 64 69 71 68  Resp: 16 18 16 18   Temp: 98.2 F (36.8 C) 97.8 F (36.6 C)  98.2 F (36.8 C)  TempSrc: Oral     SpO2: 100% 100% 99% 100%  Weight:      Height:        Intake/Output Summary (Last 24 hours) at 06/17/2023 1352 Last data filed at 06/17/2023 0500 Gross per 24 hour  Intake 790 ml  Output --  Net 790 ml   Filed Weights   06/08/23 1801 06/09/23 2100  Weight: 56.2 kg 54.7 kg    Examination:  General exam: Appears fatigued and deconditioned Respiratory  system: Diffuse scattered wheeze.  Bilateral crackles.  1 L Cardiovascular system: S1-S2, RRR, no murmurs, no pedal edema Gastrointestinal system: Soft, NT/ND, normal bowel sounds Central nervous system: Alert and oriented. No focal neurological deficits. Extremities: Symmetric 5 x 5 power. Skin: No rashes, lesions or ulcers Psychiatry: Judgement and insight appear normal. Mood & affect appropriate.     Data Reviewed: I have personally reviewed following labs and imaging studies  CBC: Recent Labs  Lab  06/12/23 1044 06/13/23 0509 06/16/23 0429  WBC 10.1 7.2 9.2  HGB 8.7* 8.1* 8.6*  HCT 28.1* 25.7* 26.6*  MCV 85.2 84.0 82.4  PLT 283 268 379   Basic Metabolic Panel: Recent Labs  Lab 06/12/23 1044 06/13/23 0509 06/16/23 0429  NA 135 135 134*  K 4.7 4.8 4.1  CL 99 100 104  CO2 26 28 23   GLUCOSE 96 73 120*  BUN 31* 28* 28*  CREATININE 0.73 0.52 0.71  CALCIUM  8.4* 8.3* 8.1*  MG 2.3 2.3 2.3  PHOS 3.7 4.6 3.6   GFR: Estimated Creatinine Clearance: 47.2 mL/min (by C-G formula based on SCr of 0.71 mg/dL). Liver Function Tests: No results for input(s): AST, ALT, ALKPHOS, BILITOT, PROT, ALBUMIN in the last 168 hours. No results for input(s): LIPASE, AMYLASE in the last 168 hours. No results for input(s): AMMONIA in the last 168 hours. Coagulation Profile: No results for input(s): INR, PROTIME in the last 168 hours. Cardiac Enzymes: No results for input(s): CKTOTAL, CKMB, CKMBINDEX, TROPONINI in the last 168 hours. BNP (last 3 results) No results for input(s): PROBNP in the last 8760 hours. HbA1C: No results for input(s): HGBA1C in the last 72 hours. CBG: Recent Labs  Lab 06/13/23 0813 06/13/23 0836 06/13/23 1103 06/14/23 2231 06/15/23 1741  GLUCAP 58* 101* 89 204* 127*   Lipid Profile: No results for input(s): CHOL, HDL, LDLCALC, TRIG, CHOLHDL, LDLDIRECT in the last 72 hours. Thyroid Function Tests: No results for input(s): TSH, T4TOTAL, FREET4, T3FREE, THYROIDAB in the last 72 hours. Anemia Panel: No results for input(s): VITAMINB12, FOLATE, FERRITIN, TIBC, IRON , RETICCTPCT in the last 72 hours. Sepsis Labs: No results for input(s): PROCALCITON, LATICACIDVEN in the last 168 hours.  Recent Results (from the past 240 hours)  Resp panel by RT-PCR (RSV, Flu A&B, Covid) Anterior Nasal Swab     Status: None   Collection Time: 06/08/23  6:28 PM   Specimen: Anterior Nasal Swab  Result Value Ref  Range Status   SARS Coronavirus 2 by RT PCR NEGATIVE NEGATIVE Final    Comment: (NOTE) SARS-CoV-2 target nucleic acids are NOT DETECTED.  The SARS-CoV-2 RNA is generally detectable in upper respiratory specimens during the acute phase of infection. The lowest concentration of SARS-CoV-2 viral copies this assay can detect is 138 copies/mL. A negative result does not preclude SARS-Cov-2 infection and should not be used as the sole basis for treatment or other patient management decisions. A negative result may occur with  improper specimen collection/handling, submission of specimen other than nasopharyngeal swab, presence of viral mutation(s) within the areas targeted by this assay, and inadequate number of viral copies(<138 copies/mL). A negative result must be combined with clinical observations, patient history, and epidemiological information. The expected result is Negative.  Fact Sheet for Patients:  bloggercourse.com  Fact Sheet for Healthcare Providers:  seriousbroker.it  This test is no t yet approved or cleared by the [place]  FDA and  has been authorized for detection and/or diagnosis of SARS-CoV-2 by FDA under an Emergency Use Authorization (  EUA). This EUA will remain  in effect (meaning this test can be used) for the duration of the COVID-19 declaration under Section 564(b)(1) of the Act, 21 U.S.C.section 360bbb-3(b)(1), unless the authorization is terminated  or revoked sooner.       Influenza A by PCR NEGATIVE NEGATIVE Final   Influenza B by PCR NEGATIVE NEGATIVE Final    Comment: (NOTE) The Xpert Xpress SARS-CoV-2/FLU/RSV plus assay is intended as an aid in the diagnosis of influenza from Nasopharyngeal swab specimens and should not be used as a sole basis for treatment. Nasal washings and aspirates are unacceptable for Xpert Xpress SARS-CoV-2/FLU/RSV testing.  Fact Sheet for  Patients: bloggercourse.com  Fact Sheet for Healthcare Providers: seriousbroker.it  This test is not yet approved or cleared by the [place]  FDA and has been authorized for detection and/or diagnosis of SARS-CoV-2 by FDA under an Emergency Use Authorization (EUA). This EUA will remain in effect (meaning this test can be used) for the duration of the COVID-19 declaration under Section 564(b)(1) of the Act, 21 U.S.C. section 360bbb-3(b)(1), unless the authorization is terminated or revoked.     Resp Syncytial Virus by PCR NEGATIVE NEGATIVE Final    Comment: (NOTE) Fact Sheet for Patients: bloggercourse.com  Fact Sheet for Healthcare Providers: seriousbroker.it  This test is not yet approved or cleared by the [place]  FDA and has been authorized for detection and/or diagnosis of SARS-CoV-2 by FDA under an Emergency Use Authorization (EUA). This EUA will remain in effect (meaning this test can be used) for the duration of the COVID-19 declaration under Section 564(b)(1) of the Act, 21 U.S.C. section 360bbb-3(b)(1), unless the authorization is terminated or revoked.  Performed at [facility], [address] [place], [place] [address]   Blood Culture (routine x 2)     Status: None   Collection Time: 06/08/23  6:30 PM   Specimen: BLOOD  Result Value Ref Range Status   Specimen Description BLOOD LEFT ASSIST CONTROL  Final   Special Requests   Final    BOTTLES DRAWN AEROBIC AND ANAEROBIC Blood Culture adequate volume   Culture   Final    NO GROWTH 5 DAYS Performed at [facility], [address] [place], [place] [address]    Report Status 06/13/2023 FINAL  Final  Blood Culture (routine x 2)     Status: None   Collection Time: 06/08/23  6:36 PM   Specimen: BLOOD  Result Value Ref Range Status   Specimen Description BLOOD RIGHT ANTECUBITAL  Final    Special Requests BOTTLES DRAWN AEROBIC AND ANAEROBIC BCAV  Final   Culture   Final    NO GROWTH 5 DAYS Performed at [facility] [address] [place], [place] [address]    Report Status 06/13/2023 FINAL  Final  Respiratory (~20 pathogens) panel by PCR     Status: None   Collection Time: 06/10/23 10:30 PM   Specimen: Nasopharyngeal Swab; Respiratory  Result Value Ref Range Status   Adenovirus NOT DETECTED NOT DETECTED Final   Coronavirus 229E NOT DETECTED NOT DETECTED Final    Comment: (NOTE) The Coronavirus on the Respiratory Panel, DOES NOT test for the novel  Coronavirus (2019 nCoV)    Coronavirus HKU1 NOT DETECTED NOT DETECTED Final   Coronavirus NL63 NOT DETECTED NOT DETECTED Final   Coronavirus OC43 NOT DETECTED NOT DETECTED Final   Metapneumovirus NOT DETECTED NOT DETECTED Final   Rhinovirus / Enterovirus NOT DETECTED NOT DETECTED Final   Influenza A  NOT DETECTED NOT DETECTED Final   Influenza B NOT DETECTED NOT DETECTED Final   Parainfluenza Virus 1 NOT DETECTED NOT DETECTED Final   Parainfluenza Virus 2 NOT DETECTED NOT DETECTED Final   Parainfluenza Virus 3 NOT DETECTED NOT DETECTED Final   Parainfluenza Virus 4 NOT DETECTED NOT DETECTED Final   Respiratory Syncytial Virus NOT DETECTED NOT DETECTED Final   Bordetella pertussis NOT DETECTED NOT DETECTED Final   Bordetella Parapertussis NOT DETECTED NOT DETECTED Final   Chlamydophila pneumoniae NOT DETECTED NOT DETECTED Final   Mycoplasma pneumoniae NOT DETECTED NOT DETECTED Final    Comment: Performed at [facility], [address], [place], [place] [address]         Radiology Studies: DG Chest Port 1 View Result Date: 06/16/2023 CLINICAL DATA:  Hypoxia.  Cough. EXAM: PORTABLE CHEST 1 VIEW COMPARISON:  Chest radiograph dated 06/08/2023 and CT dated 06/08/2023. FINDINGS: Background of emphysema. Faint scattered reticular densities in the left lung, likely scarring. No consolidative changes. There  is no pleural effusion or pneumothorax. The cardiac silhouette is within normal limits. Atherosclerotic calcification of the aorta. No acute osseous pathology. IMPRESSION: 1. No active disease. 2. Emphysema. Electronically Signed   By: Vanetta Chou M.D.   On: 06/16/2023 15:29        Scheduled Meds:  amLODipine   10 mg Oral Daily   arformoterol   15 mcg Nebulization BID   aspirin  EC  81 mg Oral Daily   atorvastatin   80 mg Oral Daily   budesonide  (PULMICORT ) nebulizer solution  0.25 mg Nebulization BID   citalopram   20 mg Oral Daily   cyanocobalamin   1,000 mcg Oral Daily   enoxaparin  (LOVENOX ) injection  40 mg Subcutaneous Q24H   feeding supplement  237 mL Oral BID BM   ferrous sulfate   325 mg Oral QAC breakfast   guaiFENesin   20 mL Oral TID   ipratropium-albuterol   3 mL Nebulization Q6H   isosorbide  mononitrate  15 mg Oral Daily   lidocaine   1 patch Transdermal Q24H   losartan   100 mg Oral Daily   metoprolol  succinate  25 mg Oral Daily   multivitamin with minerals  1 tablet Oral Daily   pantoprazole   40 mg Oral Daily   predniSONE   40 mg Oral Q breakfast   ticagrelor   90 mg Oral BID   Vitamin D  (Ergocalciferol )  50,000 Units Oral Q7 days   Continuous Infusions:   LOS: 8 days   Calvin KATHEE Robson, MD [facility]   If 7PM-7AM, please contact night-coverage  06/17/2023, 1:52 PM

## 2023-06-18 DIAGNOSIS — J189 Pneumonia, unspecified organism: Secondary | ICD-10-CM | POA: Diagnosis not present

## 2023-06-18 NOTE — Plan of Care (Signed)

## 2023-06-18 NOTE — Progress Notes (Signed)
 PROGRESS NOTE    Terri Burns  FMW:[id number] DOB: 28-Jun-1950 DOA: 06/08/2023 PCP: Sampson Ethridge LABOR, MD    Brief Narrative:   73 y.o. female with medical history significant for COPD, GERD, hypertension, dyslipidemia and depression, who presented to the emergency room with a chief complaint of worsening dyspnea with associated cough productive of creamy sputum as well as wheezing since yesterday with no fever or chills.  He denied any chest pain or palpitations.  No dysuria, oliguria or hematuria or flank pain.  No nausea or vomiting or abdominal pain.  The patient was initially in significant respiratory distress she was placed on CPAP and was later on tapered to 3 L of O2 by nasal cannula.      Assessment & Plan:   Principal Problem:   CAP (community acquired pneumonia) Active Problems:   COPD exacerbation (HCC)   Right lower lobe pneumonia   Acute respiratory failure with hypoxia (HCC)   Pulmonary nodule   GERD without esophagitis   Benign essential HTN   Depression   Dyslipidemia   Coronary artery disease   Acute hypoxic respiratory failure (HCC)   Community acquired pneumonia of right lower lobe of lung  # COPD exacerbation # Community-acquired pneumonia Patient was initially improving however on 1/10 respiratory status appears to have worsened.  More wheezing and rhonchorous breath sounds.  Chest x-ray without acute findings Plan: P.o. prednisone  40 mg daily Continue multimodal bronchodilator regimen Wean oxygen as tolerated If patient continues to be in respiratory distress will involve pulmonary for comment 1/13    # Acute hypoxic respiratory failure due to PNA and copd Management as above Wean oxygen as tolerated Unable to wean past 1 L at this time   # Pulmonary nodules Outpatient pulmonary follow-up   # GERD: PPI   # CAD PTA aspirin  and Brilinta  PTA Toprol  and Imdur    # Hyperlipidemia PTA statin   # Depression PTA Celexa    #  Hypertension BP controlled   # Functional decline Therapy evaluations Rec SNF.  Patient declining   # Vitamin D  deficiency: started vitamin D  50,000 units p.o. weekly, follow with PCP to repeat vitamin D  level after 3 to 6 months.   # Iron  deficiency anemia, transferrin saturation 6%.  Continue oral iron  supplement.  # Vitamin B12 level 206, goal >400.  Continue B12 supplement.  Follow with PCP to repeat labs after 3 to 6 months.   DVT prophylaxis: SQ Lovenox  Code Status: Full Family Communication: None Disposition Plan: Status is: Inpatient Remains inpatient appropriate because: Persistent hypoxia and slow to improve COPD   Level of care: Telemetry Medical  Consultants:  None  Procedures:  None  Antimicrobials: None   Subjective: Seen and examined.  Appears to be symptomatically improved over interval.  Objective: Vitals:   06/17/23 2156 06/18/23 0423 06/18/23 0751 06/18/23 0758  BP:  129/77  124/83  Pulse:  66 65 62  Resp:   16 16  Temp:  97.9 F (36.6 C)  98.5 F (36.9 C)  TempSrc:  Oral  Oral  SpO2: 100% 100% 99% 100%  Weight:      Height:        Intake/Output Summary (Last 24 hours) at 06/18/2023 1424 Last data filed at 06/18/2023 0500 Gross per 24 hour  Intake 600 ml  Output --  Net 600 ml   Filed Weights   06/08/23 1801 06/09/23 2100  Weight: 56.2 kg 54.7 kg    Examination:  General exam: No acute distress  Respiratory system: Bibasilar scattered wheeze.  Normal work of breathing.  1 L Cardiovascular system: S1-S2, RRR, no murmurs, no pedal edema Gastrointestinal system: Soft, NT/ND, normal bowel sounds Central nervous system: Alert and oriented. No focal neurological deficits. Extremities: Symmetric 5 x 5 power. Skin: No rashes, lesions or ulcers Psychiatry: Judgement and insight appear normal. Mood & affect appropriate.     Data Reviewed: I have personally reviewed following labs and imaging studies  CBC: Recent Labs  Lab  06/12/23 1044 06/13/23 0509 06/16/23 0429  WBC 10.1 7.2 9.2  HGB 8.7* 8.1* 8.6*  HCT 28.1* 25.7* 26.6*  MCV 85.2 84.0 82.4  PLT 283 268 379   Basic Metabolic Panel: Recent Labs  Lab 06/12/23 1044 06/13/23 0509 06/16/23 0429  NA 135 135 134*  K 4.7 4.8 4.1  CL 99 100 104  CO2 26 28 23   GLUCOSE 96 73 120*  BUN 31* 28* 28*  CREATININE 0.73 0.52 0.71  CALCIUM  8.4* 8.3* 8.1*  MG 2.3 2.3 2.3  PHOS 3.7 4.6 3.6   GFR: Estimated Creatinine Clearance: 47.2 mL/min (by C-G formula based on SCr of 0.71 mg/dL). Liver Function Tests: No results for input(s): AST, ALT, ALKPHOS, BILITOT, PROT, ALBUMIN in the last 168 hours. No results for input(s): LIPASE, AMYLASE in the last 168 hours. No results for input(s): AMMONIA in the last 168 hours. Coagulation Profile: No results for input(s): INR, PROTIME in the last 168 hours. Cardiac Enzymes: No results for input(s): CKTOTAL, CKMB, CKMBINDEX, TROPONINI in the last 168 hours. BNP (last 3 results) No results for input(s): PROBNP in the last 8760 hours. HbA1C: No results for input(s): HGBA1C in the last 72 hours. CBG: Recent Labs  Lab 06/13/23 0813 06/13/23 0836 06/13/23 1103 06/14/23 2231 06/15/23 1741  GLUCAP 58* 101* 89 204* 127*   Lipid Profile: No results for input(s): CHOL, HDL, LDLCALC, TRIG, CHOLHDL, LDLDIRECT in the last 72 hours. Thyroid Function Tests: No results for input(s): TSH, T4TOTAL, FREET4, T3FREE, THYROIDAB in the last 72 hours. Anemia Panel: No results for input(s): VITAMINB12, FOLATE, FERRITIN, TIBC, IRON , RETICCTPCT in the last 72 hours. Sepsis Labs: No results for input(s): PROCALCITON, LATICACIDVEN in the last 168 hours.  Recent Results (from the past 240 hours)  Resp panel by RT-PCR (RSV, Flu A&B, Covid) Anterior Nasal Swab     Status: None   Collection Time: 06/08/23  6:28 PM   Specimen: Anterior Nasal Swab  Result Value Ref  Range Status   SARS Coronavirus 2 by RT PCR NEGATIVE NEGATIVE Final    Comment: (NOTE) SARS-CoV-2 target nucleic acids are NOT DETECTED.  The SARS-CoV-2 RNA is generally detectable in upper respiratory specimens during the acute phase of infection. The lowest concentration of SARS-CoV-2 viral copies this assay can detect is 138 copies/mL. A negative result does not preclude SARS-Cov-2 infection and should not be used as the sole basis for treatment or other patient management decisions. A negative result may occur with  improper specimen collection/handling, submission of specimen other than nasopharyngeal swab, presence of viral mutation(s) within the areas targeted by this assay, and inadequate number of viral copies(<138 copies/mL). A negative result must be combined with clinical observations, patient history, and epidemiological information. The expected result is Negative.  Fact Sheet for Patients:  bloggercourse.com  Fact Sheet for Healthcare Providers:  seriousbroker.it  This test is no t yet approved or cleared by the [place]  FDA and  has been authorized for detection and/or diagnosis of SARS-CoV-2 by FDA under an  Emergency Use Authorization (EUA). This EUA will remain  in effect (meaning this test can be used) for the duration of the COVID-19 declaration under Section 564(b)(1) of the Act, 21 U.S.C.section 360bbb-3(b)(1), unless the authorization is terminated  or revoked sooner.       Influenza A by PCR NEGATIVE NEGATIVE Final   Influenza B by PCR NEGATIVE NEGATIVE Final    Comment: (NOTE) The Xpert Xpress SARS-CoV-2/FLU/RSV plus assay is intended as an aid in the diagnosis of influenza from Nasopharyngeal swab specimens and should not be used as a sole basis for treatment. Nasal washings and aspirates are unacceptable for Xpert Xpress SARS-CoV-2/FLU/RSV testing.  Fact Sheet for  Patients: bloggercourse.com  Fact Sheet for Healthcare Providers: seriousbroker.it  This test is not yet approved or cleared by the [place]  FDA and has been authorized for detection and/or diagnosis of SARS-CoV-2 by FDA under an Emergency Use Authorization (EUA). This EUA will remain in effect (meaning this test can be used) for the duration of the COVID-19 declaration under Section 564(b)(1) of the Act, 21 U.S.C. section 360bbb-3(b)(1), unless the authorization is terminated or revoked.     Resp Syncytial Virus by PCR NEGATIVE NEGATIVE Final    Comment: (NOTE) Fact Sheet for Patients: bloggercourse.com  Fact Sheet for Healthcare Providers: seriousbroker.it  This test is not yet approved or cleared by the [place]  FDA and has been authorized for detection and/or diagnosis of SARS-CoV-2 by FDA under an Emergency Use Authorization (EUA). This EUA will remain in effect (meaning this test can be used) for the duration of the COVID-19 declaration under Section 564(b)(1) of the Act, 21 U.S.C. section 360bbb-3(b)(1), unless the authorization is terminated or revoked.  Performed at [facility], [address] [place], [place] [address]   Blood Culture (routine x 2)     Status: None   Collection Time: 06/08/23  6:30 PM   Specimen: BLOOD  Result Value Ref Range Status   Specimen Description BLOOD LEFT ASSIST CONTROL  Final   Special Requests   Final    BOTTLES DRAWN AEROBIC AND ANAEROBIC Blood Culture adequate volume   Culture   Final    NO GROWTH 5 DAYS Performed at [facility], [address] [place], [place] [address]    Report Status 06/13/2023 FINAL  Final  Blood Culture (routine x 2)     Status: None   Collection Time: 06/08/23  6:36 PM   Specimen: BLOOD  Result Value Ref Range Status   Specimen Description BLOOD RIGHT ANTECUBITAL  Final    Special Requests BOTTLES DRAWN AEROBIC AND ANAEROBIC BCAV  Final   Culture   Final    NO GROWTH 5 DAYS Performed at [facility], [address] [place], [place] [address]    Report Status 06/13/2023 FINAL  Final  Respiratory (~20 pathogens) panel by PCR     Status: None   Collection Time: 06/10/23 10:30 PM   Specimen: Nasopharyngeal Swab; Respiratory  Result Value Ref Range Status   Adenovirus NOT DETECTED NOT DETECTED Final   Coronavirus 229E NOT DETECTED NOT DETECTED Final    Comment: (NOTE) The Coronavirus on the Respiratory Panel, DOES NOT test for the novel  Coronavirus (2019 nCoV)    Coronavirus HKU1 NOT DETECTED NOT DETECTED Final   Coronavirus NL63 NOT DETECTED NOT DETECTED Final   Coronavirus OC43 NOT DETECTED NOT DETECTED Final   Metapneumovirus NOT DETECTED NOT DETECTED Final   Rhinovirus / Enterovirus NOT DETECTED NOT DETECTED Final  Influenza A NOT DETECTED NOT DETECTED Final   Influenza B NOT DETECTED NOT DETECTED Final   Parainfluenza Virus 1 NOT DETECTED NOT DETECTED Final   Parainfluenza Virus 2 NOT DETECTED NOT DETECTED Final   Parainfluenza Virus 3 NOT DETECTED NOT DETECTED Final   Parainfluenza Virus 4 NOT DETECTED NOT DETECTED Final   Respiratory Syncytial Virus NOT DETECTED NOT DETECTED Final   Bordetella pertussis NOT DETECTED NOT DETECTED Final   Bordetella Parapertussis NOT DETECTED NOT DETECTED Final   Chlamydophila pneumoniae NOT DETECTED NOT DETECTED Final   Mycoplasma pneumoniae NOT DETECTED NOT DETECTED Final    Comment: Performed at [facility], [address], [place], [place] [address]         Radiology Studies: No results found.       Scheduled Meds:  amLODipine   10 mg Oral Daily   arformoterol   15 mcg Nebulization BID   aspirin  EC  81 mg Oral Daily   atorvastatin   80 mg Oral Daily   budesonide  (PULMICORT ) nebulizer solution  0.25 mg Nebulization BID   citalopram   20 mg Oral Daily   cyanocobalamin   1,000 mcg  Oral Daily   enoxaparin  (LOVENOX ) injection  40 mg Subcutaneous Q24H   feeding supplement  237 mL Oral BID BM   ferrous sulfate   325 mg Oral QAC breakfast   guaiFENesin   20 mL Oral TID   ipratropium-albuterol   3 mL Nebulization Q6H   isosorbide  mononitrate  15 mg Oral Daily   lidocaine   1 patch Transdermal Q24H   losartan   100 mg Oral Daily   metoprolol  succinate  25 mg Oral Daily   multivitamin with minerals  1 tablet Oral Daily   pantoprazole   40 mg Oral Daily   predniSONE   40 mg Oral Q breakfast   ticagrelor   90 mg Oral BID   Vitamin D  (Ergocalciferol )  50,000 Units Oral Q7 days   Continuous Infusions:   LOS: 9 days   Calvin KATHEE Robson, MD [facility]   If 7PM-7AM, please contact night-coverage  06/18/2023, 2:24 PM

## 2023-06-19 DIAGNOSIS — J189 Pneumonia, unspecified organism: Secondary | ICD-10-CM | POA: Diagnosis not present

## 2023-06-19 MED ORDER — IPRATROPIUM-ALBUTEROL 0.5-2.5 (3) MG/3ML IN SOLN
3.0000 mL | Freq: Three times a day (TID) | RESPIRATORY_TRACT | Status: DC
  Administered 2023-06-20: 3 mL via RESPIRATORY_TRACT
  Filled 2023-06-19: qty 3

## 2023-06-19 NOTE — Progress Notes (Signed)
 Mobility Specialist - Progress Note  Pre-mobility: HR 71 During mobility: HR 74 Post-mobility: HR 71    06/19/23 1421  Mobility  Activity Ambulated with assistance in room;Transferred to/from [facility];Transferred from chair to bed  Level of Assistance Contact guard assist, steadying assist  Assistive Device Front wheel walker  Distance Ambulated (ft) 20 ft  Activity Response Tolerated well  Mobility visit 1 Mobility  Mobility Specialist Start Time (ACUTE ONLY) 1400  Mobility Specialist Stop Time (ACUTE ONLY) 1412  Mobility Specialist Time Calculation (min) (ACUTE ONLY) 12 min   Nurse requested Mobility Specialist to perform oxygen saturation test with pt which includes removing pt from oxygen both at rest and while ambulating. Below are the results from that testing.     Patient Saturations on Room Air at Rest = spO2 94%   Patient Saturations on Room Air while Ambulating = spO2 95%  At end of testing pt left in room on 0 Liters of oxygen = spO2 93%  Reported results to nurse.   Pt sitting in the recliner upon entry, utilizing [place]. Pt STS to RW MinG and amb to the BSC ~12 ft from the recliner. Pt reported feeling better and stronger this date than previous dates. While seated on the BSC, Pt indep doffs soiled undergarments, completes peri care and dons new undergarments. O2 WNL-- min SOB noted throughout session. Pt amb to bed, left in fowler position with alarm set and needs within reach. RN notified.  America Silvan Mobility Specialist 06/19/23 2:54 PM

## 2023-06-19 NOTE — Progress Notes (Signed)
 Patient Saturations on Room Air at Rest = 95%  Patient Saturations on [employer] while Ambulating = 93%  Patient Saturations on 0 Liters of oxygen while Ambulating = 95%  Madie Reno, RN

## 2023-06-19 NOTE — Plan of Care (Signed)
  Problem: Health Behavior/Discharge Planning: Goal: Ability to manage health-related needs will improve Outcome: Progressing   Problem: Clinical Measurements: Goal: Respiratory complications will improve Outcome: Progressing   Problem: Nutrition: Goal: Adequate nutrition will be maintained Outcome: Progressing   Problem: Pain Management: Goal: General experience of comfort will improve Outcome: Progressing   Problem: Safety: Goal: Ability to remain free from injury will improve Outcome: Progressing

## 2023-06-19 NOTE — Care Management Important Message (Signed)
 Important Message  Patient Details  Name: Terri Burns MRN: [id number] Date of Birth: 10-05-1950   Important Message Given:  Yes - Medicare IM     Sherilyn Banker 06/19/2023, 4:10 PM

## 2023-06-19 NOTE — Progress Notes (Signed)
 Nutrition Follow-up  DOCUMENTATION CODES:   Not applicable  INTERVENTION:   Ensure Enlive po BID, each supplement provides 350 kcal and 20 grams of protein.  MVI po daily   NUTRITION DIAGNOSIS:   Increased nutrient needs related to chronic illness (COPD) as evidenced by estimated needs.  GOAL:   Patient will meet greater than or equal to 90% of their needs -progressing   MONITOR:   PO intake, Supplement acceptance, Labs, Weight trends, I & O's, Skin  ASSESSMENT:   73 y/o female with h/o CAD, HTN, depression, anxiety, HLD, GERD, IDA, COPD and B12 deficiency who is admitted with CAP.  Pt with good appetite and oral intake in hospital; pt documented to be eating 100% of meals and is drinking some Ensure. No new weight since admission; will request daily weights.   Medications reviewed and include: aspirin , celexa , B12, ferrous sulfate , MVI, protonix , prednisone , vitamin D   Labs reviewed: Na 134(L), K 4.1 wnl, P 3.6 wnl, Mg 2.3 wnl Hgb 8.6(L), Hct 26.6(L) Vitamin D  19.14(L)- 1/6 B12 206- 05/18/23  Diet Order:   Diet Order             Diet regular Fluid consistency: Thin  Diet effective now                  EDUCATION NEEDS:   Education needs have been addressed  Skin:  Skin Assessment: Reviewed RN Assessment  Last BM:  1/10- TYPE 4  Height:   Ht Readings from Last 1 Encounters:  06/09/23 4' 10 (1.473 m)    Weight:   Wt Readings from Last 1 Encounters:  06/09/23 54.7 kg    Ideal Body Weight:  43.9 kg  BMI:  Body mass index is 25.18 kg/m.  Estimated Nutritional Needs:   Kcal:  1400-1600kcal/day  Protein:  70-80g/day  Fluid:  1.2-1.4/day  Augustin Shams MS, RD, LDN If unable to be reached, please send secure chat to RD inpatient available from 8:00a-4:00p daily

## 2023-06-19 NOTE — Plan of Care (Signed)

## 2023-06-19 NOTE — Progress Notes (Signed)
 PROGRESS NOTE    Terri Burns  FMW:[id number] DOB: 12/28/1950 DOA: 06/08/2023 PCP: Sampson Ethridge LABOR, MD    Brief Narrative:   73 y.o. female with medical history significant for COPD, GERD, hypertension, dyslipidemia and depression, who presented to the emergency room with a chief complaint of worsening dyspnea with associated cough productive of creamy sputum as well as wheezing since yesterday with no fever or chills.  He denied any chest pain or palpitations.  No dysuria, oliguria or hematuria or flank pain.  No nausea or vomiting or abdominal pain.  The patient was initially in significant respiratory distress she was placed on CPAP and was later on tapered to 3 L of O2 by nasal cannula.      Assessment & Plan:   Principal Problem:   CAP (community acquired pneumonia) Active Problems:   COPD exacerbation (HCC)   Right lower lobe pneumonia   Acute respiratory failure with hypoxia (HCC)   Pulmonary nodule   GERD without esophagitis   Benign essential HTN   Depression   Dyslipidemia   Coronary artery disease   Acute hypoxic respiratory failure (HCC)   Community acquired pneumonia of right lower lobe of lung  # COPD exacerbation # Community-acquired pneumonia Patient was initially improving however on 1/10 respiratory status appears to have worsened.  More wheezing and rhonchorous breath sounds.  Chest x-ray without acute findings.  Subsequently improving Plan: P.o. prednisone  40 mg daily Continue multimodal bronchodilator regimen Wean oxygen as tolerated Patient seems to be clinically improving but may be requiring a prolonged period of recovery.  I had a lengthy discussion with patient at bedside.  Explained that our recommendation for skilled nursing facility.  She has declined stating she wishes to go home with home health services. If the patient continues to demonstrate clinical improvement anticipate discharge home with home health services on 1/14 Will perform  ambulatory desat test and assess for home oxygen need    # Acute hypoxic respiratory failure due to PNA and copd Management as above Wean oxygen as tolerated Unable to wean past 1 L at this time Ambulatory desaturation test   # Pulmonary nodules Outpatient pulmonary follow-up   # GERD: PPI   # CAD PTA aspirin  and Brilinta  PTA Toprol  and Imdur    # Hyperlipidemia PTA statin   # Depression PTA Celexa    # Hypertension BP controlled   # Functional decline Therapy evaluations Rec SNF.  Patient declining   # Vitamin D  deficiency: started vitamin D  50,000 units p.o. weekly, follow with PCP to repeat vitamin D  level after 3 to 6 months.   # Iron  deficiency anemia, transferrin saturation 6%.  Continue oral iron  supplement.  # Vitamin B12 level 206, goal >400.  Continue B12 supplement.  Follow with PCP to repeat labs after 3 to 6 months.   DVT prophylaxis: SQ Lovenox  Code Status: Full Family Communication: None Disposition Plan: Status is: Inpatient Remains inpatient appropriate because: Persistent hypoxia and slow to improve COPD   Level of care: Telemetry Medical  Consultants:  None  Procedures:  None  Antimicrobials: None   Subjective: Seen and examined.  Resting in bed.  Appears fatigued otherwise stable.  Objective: Vitals:   06/18/23 1934 06/18/23 2110 06/19/23 0348 06/19/23 0834  BP: 124/83  (!) 155/99 137/80  Pulse: 71  81 63  Resp: 20   20  Temp: 98.1 F (36.7 C)  (!) 97.5 F (36.4 C) 98.2 F (36.8 C)  TempSrc: Oral  Oral   SpO2:  100% 99% 100% 100%  Weight:      Height:        Intake/Output Summary (Last 24 hours) at 06/19/2023 1156 Last data filed at 06/19/2023 1000 Gross per 24 hour  Intake 1180 ml  Output --  Net 1180 ml   Filed Weights   06/08/23 1801 06/09/23 2100  Weight: 56.2 kg 54.7 kg    Examination:  General exam: NAD.  Appears fatigued Respiratory system: Bibasilar crackles.  No wheeze.  Normal work of breathing.  1  L Cardiovascular system: S1-S2, RRR, no murmurs, no pedal edema Gastrointestinal system: Soft, NT/ND, normal bowel sounds Central nervous system: Alert and oriented. No focal neurological deficits. Extremities: Symmetric 5 x 5 power. Skin: No rashes, lesions or ulcers Psychiatry: Judgement and insight appear normal. Mood & affect appropriate.     Data Reviewed: I have personally reviewed following labs and imaging studies  CBC: Recent Labs  Lab 06/13/23 0509 06/16/23 0429  WBC 7.2 9.2  HGB 8.1* 8.6*  HCT 25.7* 26.6*  MCV 84.0 82.4  PLT 268 379   Basic Metabolic Panel: Recent Labs  Lab 06/13/23 0509 06/16/23 0429  NA 135 134*  K 4.8 4.1  CL 100 104  CO2 28 23  GLUCOSE 73 120*  BUN 28* 28*  CREATININE 0.52 0.71  CALCIUM  8.3* 8.1*  MG 2.3 2.3  PHOS 4.6 3.6   GFR: Estimated Creatinine Clearance: 47.2 mL/min (by C-G formula based on SCr of 0.71 mg/dL). Liver Function Tests: No results for input(s): AST, ALT, ALKPHOS, BILITOT, PROT, ALBUMIN in the last 168 hours. No results for input(s): LIPASE, AMYLASE in the last 168 hours. No results for input(s): AMMONIA in the last 168 hours. Coagulation Profile: No results for input(s): INR, PROTIME in the last 168 hours. Cardiac Enzymes: No results for input(s): CKTOTAL, CKMB, CKMBINDEX, TROPONINI in the last 168 hours. BNP (last 3 results) No results for input(s): PROBNP in the last 8760 hours. HbA1C: No results for input(s): HGBA1C in the last 72 hours. CBG: Recent Labs  Lab 06/13/23 0813 06/13/23 0836 06/13/23 1103 06/14/23 2231 06/15/23 1741  GLUCAP 58* 101* 89 204* 127*   Lipid Profile: No results for input(s): CHOL, HDL, LDLCALC, TRIG, CHOLHDL, LDLDIRECT in the last 72 hours. Thyroid Function Tests: No results for input(s): TSH, T4TOTAL, FREET4, T3FREE, THYROIDAB in the last 72 hours. Anemia Panel: No results for input(s): VITAMINB12, FOLATE,  FERRITIN, TIBC, IRON , RETICCTPCT in the last 72 hours. Sepsis Labs: No results for input(s): PROCALCITON, LATICACIDVEN in the last 168 hours.  Recent Results (from the past 240 hours)  Respiratory (~20 pathogens) panel by PCR     Status: None   Collection Time: 06/10/23 10:30 PM   Specimen: Nasopharyngeal Swab; Respiratory  Result Value Ref Range Status   Adenovirus NOT DETECTED NOT DETECTED Final   Coronavirus 229E NOT DETECTED NOT DETECTED Final    Comment: (NOTE) The Coronavirus on the Respiratory Panel, DOES NOT test for the novel  Coronavirus (2019 nCoV)    Coronavirus HKU1 NOT DETECTED NOT DETECTED Final   Coronavirus NL63 NOT DETECTED NOT DETECTED Final   Coronavirus OC43 NOT DETECTED NOT DETECTED Final   Metapneumovirus NOT DETECTED NOT DETECTED Final   Rhinovirus / Enterovirus NOT DETECTED NOT DETECTED Final   Influenza A NOT DETECTED NOT DETECTED Final   Influenza B NOT DETECTED NOT DETECTED Final   Parainfluenza Virus 1 NOT DETECTED NOT DETECTED Final   Parainfluenza Virus 2 NOT DETECTED NOT DETECTED Final   Parainfluenza  Virus 3 NOT DETECTED NOT DETECTED Final   Parainfluenza Virus 4 NOT DETECTED NOT DETECTED Final   Respiratory Syncytial Virus NOT DETECTED NOT DETECTED Final   Bordetella pertussis NOT DETECTED NOT DETECTED Final   Bordetella Parapertussis NOT DETECTED NOT DETECTED Final   Chlamydophila pneumoniae NOT DETECTED NOT DETECTED Final   Mycoplasma pneumoniae NOT DETECTED NOT DETECTED Final    Comment: Performed at [facility], [address] [place], [place] [address]         Radiology Studies: No results found.       Scheduled Meds:  amLODipine   10 mg Oral Daily   arformoterol   15 mcg Nebulization BID   aspirin  EC  81 mg Oral Daily   atorvastatin   80 mg Oral Daily   budesonide  (PULMICORT ) nebulizer solution  0.25 mg Nebulization BID   citalopram   20 mg Oral Daily   cyanocobalamin   1,000 mcg Oral Daily   enoxaparin   (LOVENOX ) injection  40 mg Subcutaneous Q24H   feeding supplement  237 mL Oral BID BM   ferrous sulfate   325 mg Oral QAC breakfast   guaiFENesin   20 mL Oral TID   ipratropium-albuterol   3 mL Nebulization Q6H   isosorbide  mononitrate  15 mg Oral Daily   lidocaine   1 patch Transdermal Q24H   losartan   100 mg Oral Daily   metoprolol  succinate  25 mg Oral Daily   multivitamin with minerals  1 tablet Oral Daily   pantoprazole   40 mg Oral Daily   predniSONE   40 mg Oral Q breakfast   ticagrelor   90 mg Oral BID   Vitamin D  (Ergocalciferol )  50,000 Units Oral Q7 days   Continuous Infusions:   LOS: 10 days   Calvin KATHEE Robson, MD [facility]   If 7PM-7AM, please contact night-coverage  06/19/2023, 11:56 AM

## 2023-06-19 NOTE — Progress Notes (Signed)
 Physical Therapy Treatment Patient Details Name: Terri Burns MRN: [id number] DOB: [birth year] Today's Date: 06/19/2023   History of Present Illness Pt is a 73 y/o F admitted on 06/08/23 after presenting with c/o worsening dyspnea with cough, sputum production, & wheezing. Pt is being treated for COPD exacerbation, RLL PNA. PMH: COPD, GERD, HTN, dyslipidemia, depression    PT Comments  Pt was pleasant and motivated to participate during the session and put forth good effort throughout. Pt found on 1LO2/min with SpO2 remaining >/= 95% throughout the session measured frequently and with HR WNL.  Pt reported subjectively feeling better with less SOB and was able to amb further distances this session frequently while speaking with only minimal SOB.  Pt did take infrequent short standing therapeutic rest breaks while walking and was steady without LOB with all standing activities.  Pt stated her desire was to return home and that she has an adult grandson that can frequently assist her during the day with any needs.  Pt will benefit from continued PT services upon discharge to safely address deficits listed in patient problem list for decreased caregiver assistance and eventual return to PLOF.      If plan is discharge home, recommend the following: A little help with walking and/or transfers;A little help with bathing/dressing/bathroom;Assist for transportation;Help with stairs or ramp for entrance;Assistance with cooking/housework   Can travel by private vehicle     Yes  Equipment Recommendations  None recommended by PT    Recommendations for Other Services       Precautions / Restrictions Precautions Precautions: Fall Restrictions Weight Bearing Restrictions Per Provider Order: No     Mobility  Bed Mobility Overal bed mobility: Modified Independent             General bed mobility comments: Min extra time and use of rail only    Transfers Overall transfer level: Needs  assistance Equipment used: Rolling walker (2 wheels) Transfers: Sit to/from Stand Sit to Stand: Supervision           General transfer comment: Good control and stability from various height surfaces    Ambulation/Gait Ambulation/Gait assistance: Supervision Gait Distance (Feet): 125 Feet Assistive device: Rolling walker (2 wheels) Gait Pattern/deviations: Decreased step length - right, Decreased step length - left, Trunk flexed, Step-through pattern Gait velocity: decreased     General Gait Details: Slow cadence with min verbal cues for amb closer to the RW; pt steady throughout with no overt LOB   Stairs             Wheelchair Mobility     Tilt Bed    Modified Rankin (Stroke Patients Only)       Balance Overall balance assessment: Needs assistance Sitting-balance support: Feet supported, No upper extremity supported Sitting balance-Leahy Scale: Good     Standing balance support: During functional activity, Bilateral upper extremity supported Standing balance-Leahy Scale: Good                              Cognition Arousal: Alert Behavior During Therapy: WFL for tasks assessed/performed Overall Cognitive Status: Within Functional Limits for tasks assessed                                          Exercises      General Comments  Pertinent Vitals/Pain Pain Assessment Pain Assessment: 0-10 Pain Score: 6  Pain Location: Chronic back pain per patient Pain Descriptors / Indicators: Sore, Aching Pain Intervention(s): Repositioned, Premedicated before session, Monitored during session, Patient requesting pain meds-RN notified    Home Living                          Prior Function            PT Goals (current goals can now be found in the care plan section) Progress towards PT goals: Progressing toward goals    Frequency    Min 1X/week      PT Plan      Co-evaluation               AM-PAC PT 6 Clicks Mobility   Outcome Measure  Help needed turning from your back to your side while in a flat bed without using bedrails?: None Help needed moving from lying on your back to sitting on the side of a flat bed without using bedrails?: None Help needed moving to and from a bed to a chair (including a wheelchair)?: A Little Help needed standing up from a chair using your arms (e.g., wheelchair or bedside chair)?: A Little Help needed to walk in hospital room?: A Little Help needed climbing 3-5 steps with a railing? : A Little 6 Click Score: 20    End of Session Equipment Utilized During Treatment: Oxygen;Gait belt Activity Tolerance: Patient tolerated treatment well Patient left: with call bell/phone within reach;with chair alarm set;in chair Nurse Communication: Mobility status;Patient requests pain meds PT Visit Diagnosis: Muscle weakness (generalized) (M62.81);Unsteadiness on feet (R26.81);Difficulty in walking, not elsewhere classified (R26.2)     Time: 8860-8791 PT Time Calculation (min) (ACUTE ONLY): 29 min  Charges:    $Gait Training: 23-37 mins PT General Charges $$ ACUTE PT VISIT: 1 Visit                     D. Scott Ahliya Glatt PT, DPT 06/19/23, 1:54 PM

## 2023-06-20 DIAGNOSIS — J189 Pneumonia, unspecified organism: Secondary | ICD-10-CM | POA: Diagnosis not present

## 2023-06-20 MED ORDER — PREDNISONE 20 MG PO TABS: 40.0000 mg | ORAL_TABLET | Freq: Every day | ORAL | 0 refills | Status: AC

## 2023-06-20 NOTE — TOC Transition Note (Signed)
 Transition of Care [facility]) - Discharge Note   Patient Details  Name: Terri Burns MRN: [id number] Date of Birth: 1950-09-19  [facility] CM/SW Contact:  Corean ONEIDA Haddock, RN Phone Number: 06/20/2023, 12:07 PM   Clinical Narrative:     Patient to discharge today Notified that patient did not qualify for home O2 Larraine with [facility] notified of discharge    Barriers to Discharge: Continued Medical Work up   Patient Goals and CMS Choice            Discharge Placement                       Discharge Plan and Services Additional resources added to the After Visit Summary for                            [facility] Arranged: PT, OT [facility] Agency: [facility] Date [facility] Agency Contacted: 06/10/23   Representative spoke with at [facility] Agency: [name]  Social Drivers of Health (SDOH) Interventions SDOH Screenings   Food Insecurity: No Food Insecurity (06/09/2023)  Housing: Low Risk  (06/09/2023)  Transportation Needs: No Transportation Needs (06/09/2023)  Utilities: Not At Risk (06/09/2023)  Alcohol Screen: Low Risk  (07/13/2021)  Depression (PHQ2-9): Low Risk  (05/11/2022)  Financial Resource Strain: Low Risk  (07/13/2021)  Physical Activity: Insufficiently Active (07/13/2021)  Social Connections: Socially Isolated (06/09/2023)  Stress: No Stress Concern Present (07/13/2021)  Tobacco Use: High Risk (06/10/2023)     Readmission Risk Interventions     No data to display

## 2023-06-20 NOTE — Progress Notes (Signed)
 Physical Therapy Treatment Patient Details Name: Terri Burns MRN: [id number] DOB: December 09, 1950 Today's Date: 06/20/2023   History of Present Illness Pt is a 73 y/o F admitted on 06/08/23 after presenting with c/o worsening dyspnea with cough, sputum production, & wheezing. Pt is being treated for COPD exacerbation, RLL PNA. PMH: COPD, GERD, HTN, dyslipidemia, and depression.    PT Comments  Pt was pleasant and motivated to participate during the session and put forth good effort throughout. Pt found on room air with SpO2 97% and HR 71 bpm at rest.  Pt was eager to increase ambulation distance this session with cues and reinforcement given of using RPE to guide intensity.  Pt was able to amb 100 feet and after a therapeutic rest break was able to amb 150 feet with a RW with slow but steady cadence and no instability noted and with no cues needed for safe use of the RW.  Pt's SpO2 remained >/= 92% throughout the session with no adverse symptoms noted other than mild SOB after the longer walk that resolved quickly upon returning to sitting.  Pt education provided on general principles of activity progression with review of RPE.  Pt will benefit from continued PT services upon discharge to safely address deficits listed in patient problem list for decreased caregiver assistance and eventual return to PLOF.       If plan is discharge home, recommend the following: A little help with walking and/or transfers;A little help with bathing/dressing/bathroom;Assist for transportation;Help with stairs or ramp for entrance;Assistance with cooking/housework   Can travel by private vehicle     Yes  Equipment Recommendations  None recommended by PT    Recommendations for Other Services       Precautions / Restrictions Precautions Precautions: Fall Restrictions Weight Bearing Restrictions Per Provider Order: No     Mobility  Bed Mobility               General bed mobility comments: NT, pt in  recliner    Transfers Overall transfer level: Needs assistance Equipment used: Rolling walker (2 wheels) Transfers: Sit to/from Stand Sit to Stand: Supervision           General transfer comment: Good eccentric and concentric control and stability with no cues needed for hand placement    Ambulation/Gait Ambulation/Gait assistance: Supervision Gait Distance (Feet): 100 Feet x 1, 175 Feet x 1  Assistive device: Rolling walker (2 wheels) Gait Pattern/deviations: Decreased step length - right, Decreased step length - left, Trunk flexed, Step-through pattern Gait velocity: decreased     General Gait Details: Slow cadence with safe sequencing throughout and no overt LOB or instability noted   Stairs             Wheelchair Mobility     Tilt Bed    Modified Rankin (Stroke Patients Only)       Balance           Standing balance support: During functional activity, Bilateral upper extremity supported Standing balance-Leahy Scale: Good                              Cognition Arousal: Alert Behavior During Therapy: WFL for tasks assessed/performed Overall Cognitive Status: Within Functional Limits for tasks assessed  Exercises      General Comments        Pertinent Vitals/Pain Pain Assessment Pain Assessment: No/denies pain    Home Living                          Prior Function            PT Goals (current goals can now be found in the care plan section) Progress towards PT goals: Progressing toward goals    Frequency    Min 1X/week      PT Plan      Co-evaluation              AM-PAC PT 6 Clicks Mobility   Outcome Measure  Help needed turning from your back to your side while in a flat bed without using bedrails?: None Help needed moving from lying on your back to sitting on the side of a flat bed without using bedrails?: None Help needed  moving to and from a bed to a chair (including a wheelchair)?: A Little Help needed standing up from a chair using your arms (e.g., wheelchair or bedside chair)?: A Little Help needed to walk in hospital room?: A Little Help needed climbing 3-5 steps with a railing? : A Little 6 Click Score: 20    End of Session Equipment Utilized During Treatment: Gait belt Activity Tolerance: Patient tolerated treatment well Patient left: with call bell/phone within reach;with chair alarm set;in chair Nurse Communication: Mobility status PT Visit Diagnosis: Muscle weakness (generalized) (M62.81);Unsteadiness on feet (R26.81);Difficulty in walking, not elsewhere classified (R26.2)     Time: 8944-8880 PT Time Calculation (min) (ACUTE ONLY): 24 min  Charges:    $Gait Training: 23-37 mins PT General Charges $$ ACUTE PT VISIT: 1 Visit                     D. Scott Jeaneane Adamec PT, DPT 06/20/23, 11:55 AM

## 2023-06-20 NOTE — Plan of Care (Signed)

## 2023-06-20 NOTE — Discharge Summary (Signed)
 Physician Discharge Summary  Terri Burns FMW:[id number] DOB: [birth year] DOA: 06/08/2023  PCP: Sampson Ethridge LABOR, MD  Admit date: 06/08/2023 Discharge date: 06/20/2023  Admitted From: Home Disposition: Home with home health  Recommendations for Outpatient Follow-up:  Follow up with PCP in 1-2 weeks   Home Health: Yes PT OT RN aide Equipment/Devices: None  Discharge Condition: Stable CODE STATUS: Full Diet recommendation: Regular  Brief/Interim Summary:  73 y.o. female with medical history significant for COPD, GERD, hypertension, dyslipidemia and depression, who presented to the emergency room with a chief complaint of worsening dyspnea with associated cough productive of creamy sputum as well as wheezing since yesterday with no fever or chills.  He denied any chest pain or palpitations.  No dysuria, oliguria or hematuria or flank pain.  No nausea or vomiting or abdominal pain.  The patient was initially in significant respiratory distress she was placed on CPAP and was later on tapered to 3 L of O2 by nasal cannula.       Discharge Diagnoses:  Principal Problem:   CAP (community acquired pneumonia) Active Problems:   COPD exacerbation (HCC)   Right lower lobe pneumonia   Acute respiratory failure with hypoxia (HCC)   Pulmonary nodule   GERD without esophagitis   Benign essential HTN   Depression   Dyslipidemia   Coronary artery disease   Acute hypoxic respiratory failure (HCC)   Community acquired pneumonia of right lower lobe of lung  # COPD exacerbation # Community-acquired pneumonia Patient was initially improving however on 1/10 respiratory status appears to have worsened.  More wheezing and rhonchorous breath sounds.  Chest x-ray without acute findings.  Subsequently improving Plan: Patient on room air at time of discharge.  Antibiotics completed.  Stable for discharge home at this time.  Short course of p.o. prednisone  prescribed.  Patient is stable for DC home  and physical therapy recommendations have been updated.  Full scope of home health services ordered.  No home oxygen necessary on DC. P.o. prednisone  40 mg daily Continue multimodal bronchodilator regimen Wean oxygen as tolerated Patient seems to be clinically improving but may be requiring a prolonged period of recovery.  I had a lengthy discussion with patient at bedside.  Explained that our recommendation for skilled nursing facility.  She has declined stating she wishes to go home with home health services. If the patient continues to demonstrate clinical improvement anticipate discharge home with home health services on 1/14 Will perform ambulatory desat test and assess for home oxygen need     # Acute hypoxic respiratory failure due to PNA and copd Resolved.  On room air at time of discharge   Discharge Instructions  Discharge Instructions     Diet - low sodium heart healthy   Complete by: As directed    Increase activity slowly   Complete by: As directed       Allergies as of 06/20/2023       Reactions   Levofloxacin Shortness Of Breath   Penicillins Hives   itching   Meloxicam     dizziness   Nsaids Other (See Comments)   Patient states she can tolerate up to three doses per day without incident        Medication List     TAKE these medications    Accu-Chek Guide test strip Generic drug: glucose blood USE AS DIRECTED TO Check blood glucose   Accu-Chek Softclix Lancets lancets as directed.   albuterol  (2.5 MG/3ML) 0.083% nebulizer solution Commonly known  as: PROVENTIL  Take 3 mLs (2.5 mg total) by nebulization 4 (four) times daily.   albuterol  108 (90 Base) MCG/ACT inhaler Commonly known as: Ventolin  HFA Inhale 2 puffs into the lungs every 4 (four) hours as needed for wheezing or shortness of breath.   amLODipine  10 MG tablet Commonly known as: NORVASC  Take 1 tablet (10 mg total) by mouth daily.   aspirin  EC 81 MG tablet TAKE 1 TABLET BY MOUTH EVERY  DAY   atorvastatin  40 MG tablet Commonly known as: LIPITOR  Take 2 tablets (80 mg total) by mouth daily.   Brilinta  90 MG Tabs tablet Generic drug: ticagrelor  TAKE ONE TABLET BY MOUTH EVERY MORNING and TAKE ONE TABLET BY MOUTH EVERYDAY AT BEDTIME   citalopram  20 MG tablet Commonly known as: CELEXA  TAKE 1 AND 1/2 TABLETS BY MOUTH EVERY EVENING   cyanocobalamin  1000 MCG tablet Take 1 tablet (1,000 mcg total) by mouth daily.   ferrous sulfate  325 (65 FE) MG tablet Take 1 tablet (325 mg total) by mouth daily before breakfast. Take on an empty stomach with one cup of juice, preferably orange juice   fluticasone  50 MCG/ACT nasal spray Commonly known as: FLONASE  PLACE TWO SPRAYS into BOTH nostrils DAILY What changed:  how much to take how to take this when to take this reasons to take this   HYDROcodone -acetaminophen  10-325 MG tablet Commonly known as: NORCO Take 1 tablet by mouth 4 (four) times daily as needed. What changed: Another medication with the same name was removed. Continue taking this medication, and follow the directions you see here.   ipratropium-albuterol  0.5-2.5 (3) MG/3ML Soln Commonly known as: DUONEB Take 3 mLs by nebulization every 6 (six) hours as needed.   isosorbide  mononitrate 30 MG 24 hr tablet Commonly known as: IMDUR  TAKE 1/2 TABLET BY MOUTH ONCE DAILY   loratadine  10 MG tablet Commonly known as: Allergy Relief Take 1 tablet (10 mg total) by mouth daily.   losartan  100 MG tablet Commonly known as: COZAAR  Take 1 tablet (100 mg total) by mouth every evening. What changed: when to take this   metoprolol  succinate 25 MG 24 hr tablet Commonly known as: TOPROL -XL Take 1 tablet (25 mg total) by mouth every evening. What changed: when to take this   Narcan  4 MG/0.1ML Liqd nasal spray kit Generic drug: naloxone  1 spray as needed. As needed in case of overdose   pantoprazole  40 MG tablet Commonly known as: PROTONIX  Take 1 tablet (40 mg total) by  mouth every evening. What changed: when to take this   predniSONE  20 MG tablet Commonly known as: DELTASONE  Take 2 tablets (40 mg total) by mouth daily with breakfast for 4 days. Start taking on: June 21, 2023   Trelegy Ellipta  100-62.5-25 MCG/ACT Aepb Generic drug: Fluticasone -Umeclidin-Vilant Inhale 1 puff into the lungs daily.        Allergies  Allergen Reactions   Levofloxacin Shortness Of Breath   Penicillins Hives    itching   Meloxicam      dizziness   Nsaids Other (See Comments)    Patient states she can tolerate up to three doses per day without incident    Consultations: None   Procedures/Studies: DG Chest Port 1 View Result Date: 06/16/2023 CLINICAL DATA:  Hypoxia.  Cough. EXAM: PORTABLE CHEST 1 VIEW COMPARISON:  Chest radiograph dated 06/08/2023 and CT dated 06/08/2023. FINDINGS: Background of emphysema. Faint scattered reticular densities in the left lung, likely scarring. No consolidative changes. There is no pleural effusion or pneumothorax. The  cardiac silhouette is within normal limits. Atherosclerotic calcification of the aorta. No acute osseous pathology. IMPRESSION: 1. No active disease. 2. Emphysema. Electronically Signed   By: Vanetta Chou M.D.   On: 06/16/2023 15:29   CT Angio Chest PE W and/or Wo Contrast Result Date: 06/08/2023 CLINICAL DATA:  Pulmonary embolism (PE) suspected, low to intermediate prob, neg D-dimer EXAM: CT ANGIOGRAPHY CHEST WITH CONTRAST TECHNIQUE: Multidetector CT imaging of the chest was performed using the standard protocol during bolus administration of intravenous contrast. Multiplanar CT image reconstructions and MIPs were obtained to evaluate the vascular anatomy. RADIATION DOSE REDUCTION: This exam was performed according to the departmental dose-optimization program which includes automated exposure control, adjustment of the mA and/or kV according to patient size and/or use of iterative reconstruction technique. CONTRAST:   75mL OMNIPAQUE  IOHEXOL  350 MG/ML SOLN COMPARISON:  CT angio chest 05/15/2023, chest x-ray 06/08/2023 FINDINGS: Cardiovascular: Satisfactory opacification of the pulmonary arteries to the segmental level. No evidence of pulmonary embolism. Pulmonary artery measures at the upper limits of normal. Normal heart size. No significant pericardial effusion. The thoracic aorta is normal in caliber. Moderate atherosclerotic plaque of the thoracic aorta. Four-vessel coronary artery calcifications. Mediastinum/Nodes: No enlarged mediastinal, hilar, or axillary lymph nodes. Thyroid gland, trachea, and esophagus demonstrate no significant findings. Lungs/Pleura: Centrilobular at least moderate emphysematous changes. Mild to moderate paraseptal and stones changes. Diffuse bronchial wall thickening. Right lower lobe 0.8 x 0.5 cm pulmonary nodule with surrounding ground-glass airspace opacity. Interval decrease in cluster of nodule and airspace opacity of right upper lobe measuring 1.1 x 0.4 cm (from 1.9 x 1.6 cm) (5:33). Redemonstration of lingular and left lower lobe subpleural nodular like airspace opacities that are chronic and likely represent scarring. Interval increase in patchy airspace opacities of the dependent right lower lobe with interlobular septal wall thickening. No pulmonary mass. No pleural effusion. No pneumothorax. Upper Abdomen: No acute abnormality. Splenule noted. Severe atherosclerotic plaque. Musculoskeletal: No chest wall abnormality. No suspicious lytic or blastic osseous lesions. No acute displaced fracture. Cervical surgical hardware. Exaggerated kyphotic curvature of the thoracic spine. Review of the MIP images confirms the above findings. IMPRESSION: 1. No pulmonary nodule. 2. The main pulmonary artery measures at the upper limits of normal-correlate for pulmonary hypertension. 3. Right lower lobe 0.8 x 0.5 cm pulmonary nodule with surrounding ground-glass airspace opacity may be residual infection  from CT angio chest 05/14/2023. Interval decrease in cluster of nodule and airspace opacity of right upper lobe measuring 1.1 x 0.4 cm likely representing resolving infection. Recommend follow-up CT in 3 months to evaluate for complete resolution. 4. Interval increase in patchy airspace opacities of the dependent right lower lobe with interlobular septal wall thickening suggestive of infection/inflammation. 5. Small airway disease in the setting of emphysema. 6. Aortic Atherosclerosis (ICD10-I70.0) and Emphysema (ICD10-J43.9). Electronically Signed   By: Morgane  Naveau M.D.   On: 06/08/2023 20:55   DG Chest Port 1 View Result Date: 06/08/2023 CLINICAL DATA:  Questionable sepsis - evaluate for abnormality EXAM: PORTABLE CHEST 1 VIEW COMPARISON:  CT angio chest 06/08/2023, chest x-ray 05/19/2023, CT angio chest 05/15/2023 FINDINGS: The heart and mediastinal contours are within normal limits. Atherosclerotic plaque. Chronic coarsened interstitial markings. No focal consolidation. No pulmonary edema. No pleural effusion. No pneumothorax. No acute osseous abnormality. IMPRESSION: 1. No active disease. 2. Aortic Atherosclerosis (ICD10-I70.0) and Emphysema (ICD10-J43.9). Electronically Signed   By: Morgane  Naveau M.D.   On: 06/08/2023 20:32      Subjective: Seen and examined on the  day of discharge.  Stable no distress.  Appropriate discharge home.  Discharge Exam: Vitals:   06/20/23 0213 06/20/23 0804  BP: (!) 143/81 138/80  Pulse: 65 65  Resp: 17 18  Temp: 97.6 F (36.4 C) 98.2 F (36.8 C)  SpO2: 98% 98%   Vitals:   06/19/23 1942 06/20/23 0213 06/20/23 0500 06/20/23 0804  BP: 117/68 (!) 143/81  138/80  Pulse: 75 65  65  Resp: (!) 22 17  18   Temp: 98.1 F (36.7 C) 97.6 F (36.4 C)  98.2 F (36.8 C)  TempSrc: Oral Oral    SpO2: 98% 98%  98%  Weight:   54.9 kg   Height:        General: Pt is alert, awake, not in acute distress Cardiovascular: RRR, S1/S2 +, no rubs, no  gallops Respiratory: CTA bilaterally, no wheezing, no rhonchi Abdominal: Soft, NT, ND, bowel sounds + Extremities: no edema, no cyanosis    The results of significant diagnostics from this hospitalization (including imaging, microbiology, ancillary and laboratory) are listed below for reference.     Microbiology: Recent Results (from the past 240 hours)  Respiratory (~20 pathogens) panel by PCR     Status: None   Collection Time: 06/10/23 10:30 PM   Specimen: Nasopharyngeal Swab; Respiratory  Result Value Ref Range Status   Adenovirus NOT DETECTED NOT DETECTED Final   Coronavirus 229E NOT DETECTED NOT DETECTED Final    Comment: (NOTE) The Coronavirus on the Respiratory Panel, DOES NOT test for the novel  Coronavirus (2019 nCoV)    Coronavirus HKU1 NOT DETECTED NOT DETECTED Final   Coronavirus NL63 NOT DETECTED NOT DETECTED Final   Coronavirus OC43 NOT DETECTED NOT DETECTED Final   Metapneumovirus NOT DETECTED NOT DETECTED Final   Rhinovirus / Enterovirus NOT DETECTED NOT DETECTED Final   Influenza A NOT DETECTED NOT DETECTED Final   Influenza B NOT DETECTED NOT DETECTED Final   Parainfluenza Virus 1 NOT DETECTED NOT DETECTED Final   Parainfluenza Virus 2 NOT DETECTED NOT DETECTED Final   Parainfluenza Virus 3 NOT DETECTED NOT DETECTED Final   Parainfluenza Virus 4 NOT DETECTED NOT DETECTED Final   Respiratory Syncytial Virus NOT DETECTED NOT DETECTED Final   Bordetella pertussis NOT DETECTED NOT DETECTED Final   Bordetella Parapertussis NOT DETECTED NOT DETECTED Final   Chlamydophila pneumoniae NOT DETECTED NOT DETECTED Final   Mycoplasma pneumoniae NOT DETECTED NOT DETECTED Final    Comment: Performed at [facility], [address] [place], [place] [address]     Labs: BNP (last 3 results) Recent Labs    05/14/23 1903 06/08/23 1820  BNP 156.4* 158.2*   Basic Metabolic Panel: Recent Labs  Lab 06/16/23 0429  NA 134*  K 4.1  CL 104  CO2 23  GLUCOSE 120*   BUN 28*  CREATININE 0.71  CALCIUM  8.1*  MG 2.3  PHOS 3.6   Liver Function Tests: No results for input(s): AST, ALT, ALKPHOS, BILITOT, PROT, ALBUMIN in the last 168 hours. No results for input(s): LIPASE, AMYLASE in the last 168 hours. No results for input(s): AMMONIA in the last 168 hours. CBC: Recent Labs  Lab 06/16/23 0429  WBC 9.2  HGB 8.6*  HCT 26.6*  MCV 82.4  PLT 379   Cardiac Enzymes: No results for input(s): CKTOTAL, CKMB, CKMBINDEX, TROPONINI in the last 168 hours. BNP: Invalid input(s): POCBNP CBG: Recent Labs  Lab 06/14/23 2231 06/15/23 1741  GLUCAP 204* 127*   D-Dimer No results for input(s):  DDIMER in the last 72 hours. Hgb A1c No results for input(s): HGBA1C in the last 72 hours. Lipid Profile No results for input(s): CHOL, HDL, LDLCALC, TRIG, CHOLHDL, LDLDIRECT in the last 72 hours. Thyroid function studies No results for input(s): TSH, T4TOTAL, T3FREE, THYROIDAB in the last 72 hours.  Invalid input(s): FREET3 Anemia work up No results for input(s): VITAMINB12, FOLATE, FERRITIN, TIBC, IRON , RETICCTPCT in the last 72 hours. Urinalysis    Component Value Date/Time   COLORURINE STRAW (A) 02/02/2021 1507   APPEARANCEUR CLEAR (A) 02/02/2021 1507   LABSPEC 1.010 02/02/2021 1507   PHURINE 6.0 02/02/2021 1507   GLUCOSEU NEGATIVE 02/02/2021 1507   HGBUR SMALL (A) 02/02/2021 1507   BILIRUBINUR NEGATIVE 02/02/2021 1507   KETONESUR NEGATIVE 02/02/2021 1507   PROTEINUR NEGATIVE 02/02/2021 1507   NITRITE NEGATIVE 02/02/2021 1507   LEUKOCYTESUR NEGATIVE 02/02/2021 1507   Sepsis Labs Recent Labs  Lab 06/16/23 0429  WBC 9.2   Microbiology Recent Results (from the past 240 hours)  Respiratory (~20 pathogens) panel by PCR     Status: None   Collection Time: 06/10/23 10:30 PM   Specimen: Nasopharyngeal Swab; Respiratory  Result Value Ref Range Status   Adenovirus NOT DETECTED NOT  DETECTED Final   Coronavirus 229E NOT DETECTED NOT DETECTED Final    Comment: (NOTE) The Coronavirus on the Respiratory Panel, DOES NOT test for the novel  Coronavirus (2019 nCoV)    Coronavirus HKU1 NOT DETECTED NOT DETECTED Final   Coronavirus NL63 NOT DETECTED NOT DETECTED Final   Coronavirus OC43 NOT DETECTED NOT DETECTED Final   Metapneumovirus NOT DETECTED NOT DETECTED Final   Rhinovirus / Enterovirus NOT DETECTED NOT DETECTED Final   Influenza A NOT DETECTED NOT DETECTED Final   Influenza B NOT DETECTED NOT DETECTED Final   Parainfluenza Virus 1 NOT DETECTED NOT DETECTED Final   Parainfluenza Virus 2 NOT DETECTED NOT DETECTED Final   Parainfluenza Virus 3 NOT DETECTED NOT DETECTED Final   Parainfluenza Virus 4 NOT DETECTED NOT DETECTED Final   Respiratory Syncytial Virus NOT DETECTED NOT DETECTED Final   Bordetella pertussis NOT DETECTED NOT DETECTED Final   Bordetella Parapertussis NOT DETECTED NOT DETECTED Final   Chlamydophila pneumoniae NOT DETECTED NOT DETECTED Final   Mycoplasma pneumoniae NOT DETECTED NOT DETECTED Final    Comment: Performed at [facility], [address], [place], [place] [address]     Time coordinating discharge: Over 30 minutes  SIGNED:   Calvin KATHEE Robson, MD  [facility] 06/20/2023, 1:06 PM Pager   If 7PM-7AM, please contact night-coverage

## 2023-06-29 ENCOUNTER — Institutional Professional Consult (permissible substitution): Payer: 59 | Admitting: Student in an Organized Health Care Education/Training Program

## 2023-07-02 ENCOUNTER — Inpatient Hospital Stay
Admission: EM | Admit: 2023-07-02 | Discharge: 2023-07-10 | DRG: 377 | Disposition: A | Discharge status: Home Health | Payer: 59 | Attending: Internal Medicine | Admitting: Internal Medicine

## 2023-07-02 ENCOUNTER — Other Ambulatory Visit: Payer: Self-pay

## 2023-07-02 ENCOUNTER — Encounter: Payer: Self-pay | Admitting: Emergency Medicine

## 2023-07-02 ENCOUNTER — Emergency Department: Payer: 59

## 2023-07-02 ENCOUNTER — Inpatient Hospital Stay (HOSPITAL_COMMUNITY)
Admit: 2023-07-02 | Discharge: 2023-07-02 | Disposition: A | Discharge status: Home / Self Care | Payer: 59 | Attending: Internal Medicine | Admitting: Internal Medicine

## 2023-07-02 DIAGNOSIS — Z955 Presence of coronary angioplasty implant and graft: Secondary | ICD-10-CM | POA: Diagnosis not present

## 2023-07-02 DIAGNOSIS — K219 Gastro-esophageal reflux disease without esophagitis: Secondary | ICD-10-CM | POA: Diagnosis present

## 2023-07-02 DIAGNOSIS — Z8249 Family history of ischemic heart disease and other diseases of the circulatory system: Secondary | ICD-10-CM | POA: Diagnosis not present

## 2023-07-02 DIAGNOSIS — Z888 Allergy status to other drugs, medicaments and biological substances status: Secondary | ICD-10-CM

## 2023-07-02 DIAGNOSIS — Z833 Family history of diabetes mellitus: Secondary | ICD-10-CM

## 2023-07-02 DIAGNOSIS — Z7902 Long term (current) use of antithrombotics/antiplatelets: Secondary | ICD-10-CM

## 2023-07-02 DIAGNOSIS — E785 Hyperlipidemia, unspecified: Secondary | ICD-10-CM | POA: Diagnosis present

## 2023-07-02 DIAGNOSIS — I5031 Acute diastolic (congestive) heart failure: Secondary | ICD-10-CM

## 2023-07-02 DIAGNOSIS — K922 Gastrointestinal hemorrhage, unspecified: Secondary | ICD-10-CM | POA: Diagnosis not present

## 2023-07-02 DIAGNOSIS — K264 Chronic or unspecified duodenal ulcer with hemorrhage: Principal | ICD-10-CM | POA: Diagnosis present

## 2023-07-02 DIAGNOSIS — K269 Duodenal ulcer, unspecified as acute or chronic, without hemorrhage or perforation: Secondary | ICD-10-CM

## 2023-07-02 DIAGNOSIS — K254 Chronic or unspecified gastric ulcer with hemorrhage: Secondary | ICD-10-CM

## 2023-07-02 DIAGNOSIS — Z881 Allergy status to other antibiotic agents status: Secondary | ICD-10-CM | POA: Diagnosis not present

## 2023-07-02 DIAGNOSIS — Z85828 Personal history of other malignant neoplasm of skin: Secondary | ICD-10-CM | POA: Diagnosis not present

## 2023-07-02 DIAGNOSIS — K5731 Diverticulosis of large intestine without perforation or abscess with bleeding: Secondary | ICD-10-CM | POA: Diagnosis present

## 2023-07-02 DIAGNOSIS — K2970 Gastritis, unspecified, without bleeding: Secondary | ICD-10-CM | POA: Diagnosis not present

## 2023-07-02 DIAGNOSIS — I10 Essential (primary) hypertension: Secondary | ICD-10-CM | POA: Diagnosis not present

## 2023-07-02 DIAGNOSIS — K635 Polyp of colon: Secondary | ICD-10-CM

## 2023-07-02 DIAGNOSIS — I5033 Acute on chronic diastolic (congestive) heart failure: Secondary | ICD-10-CM | POA: Diagnosis present

## 2023-07-02 DIAGNOSIS — D649 Anemia, unspecified: Secondary | ICD-10-CM | POA: Diagnosis not present

## 2023-07-02 DIAGNOSIS — Z79899 Other long term (current) drug therapy: Secondary | ICD-10-CM

## 2023-07-02 DIAGNOSIS — D122 Benign neoplasm of ascending colon: Secondary | ICD-10-CM | POA: Diagnosis present

## 2023-07-02 DIAGNOSIS — Z7982 Long term (current) use of aspirin: Secondary | ICD-10-CM | POA: Diagnosis not present

## 2023-07-02 DIAGNOSIS — J441 Chronic obstructive pulmonary disease with (acute) exacerbation: Secondary | ICD-10-CM | POA: Diagnosis present

## 2023-07-02 DIAGNOSIS — F17210 Nicotine dependence, cigarettes, uncomplicated: Secondary | ICD-10-CM | POA: Diagnosis present

## 2023-07-02 DIAGNOSIS — K5730 Diverticulosis of large intestine without perforation or abscess without bleeding: Secondary | ICD-10-CM | POA: Diagnosis not present

## 2023-07-02 DIAGNOSIS — K640 First degree hemorrhoids: Secondary | ICD-10-CM | POA: Diagnosis present

## 2023-07-02 DIAGNOSIS — I2510 Atherosclerotic heart disease of native coronary artery without angina pectoris: Secondary | ICD-10-CM | POA: Diagnosis present

## 2023-07-02 DIAGNOSIS — D500 Iron deficiency anemia secondary to blood loss (chronic): Secondary | ICD-10-CM | POA: Diagnosis present

## 2023-07-02 DIAGNOSIS — I110 Hypertensive heart disease with heart failure: Secondary | ICD-10-CM | POA: Diagnosis present

## 2023-07-02 DIAGNOSIS — Z1152 Encounter for screening for COVID-19: Secondary | ICD-10-CM

## 2023-07-02 DIAGNOSIS — J9601 Acute respiratory failure with hypoxia: Secondary | ICD-10-CM | POA: Diagnosis present

## 2023-07-02 DIAGNOSIS — Z886 Allergy status to analgesic agent status: Secondary | ICD-10-CM

## 2023-07-02 DIAGNOSIS — Z880 Allergy status to penicillin: Secondary | ICD-10-CM

## 2023-07-02 LAB — ECHOCARDIOGRAM COMPLETE
AR max vel: 2.13 cm2
AV Area VTI: 2.86 cm2
AV Area mean vel: 1.82 cm2
AV Mean grad: 5 mm[Hg]
AV Peak grad: 8.8 mm[Hg]
Ao pk vel: 1.48 m/s
Area-P 1/2: 5.93 cm2
Height: 58 in
MV VTI: 3.06 cm2
S' Lateral: 2.4 cm
Weight: 1936.52 [oz_av]

## 2023-07-02 LAB — COMPREHENSIVE METABOLIC PANEL
ALT: 26 U/L (ref 0–44)
AST: 22 U/L (ref 15–41)
Albumin: 2.7 g/dL — ABNORMAL LOW (ref 3.5–5.0)
Alkaline Phosphatase: 32 U/L — ABNORMAL LOW (ref 38–126)
Anion gap: 8 (ref 5–15)
BUN: 52 mg/dL — ABNORMAL HIGH (ref 8–23)
CO2: 21 mmol/L — ABNORMAL LOW (ref 22–32)
Calcium: 8.5 mg/dL — ABNORMAL LOW (ref 8.9–10.3)
Chloride: 108 mmol/L (ref 98–111)
Creatinine, Ser: 0.55 mg/dL (ref 0.44–1.00)
GFR, Estimated: >60 mL/min (ref >60)
Glucose, Bld: 132 mg/dL — ABNORMAL HIGH (ref 70–99)
Potassium: 4.8 mmol/L (ref 3.5–5.1)
Sodium: 137 mmol/L (ref 135–145)
Total Bilirubin: 0.5 mg/dL (ref 0.0–1.2)
Total Protein: 4.9 g/dL — ABNORMAL LOW (ref 6.5–8.1)

## 2023-07-02 LAB — IRON AND TIBC
Iron: 71 ug/dL (ref 28–170)
Saturation Ratios: 26 % (ref 10.4–31.8)
TIBC: 276 ug/dL (ref 250–450)
UIBC: 205 ug/dL

## 2023-07-02 LAB — BRAIN NATRIURETIC PEPTIDE: B Natriuretic Peptide: 44.7 pg/mL (ref 0.0–100.0)

## 2023-07-02 LAB — TROPONIN I (HIGH SENSITIVITY)
Troponin I (High Sensitivity): 4 ng/L (ref <18)
Troponin I (High Sensitivity): 5 ng/L (ref <18)

## 2023-07-02 LAB — FERRITIN: Ferritin: 27 ng/mL (ref 11–307)

## 2023-07-02 LAB — CBC
HCT: 20.6 % — ABNORMAL LOW (ref 36.0–46.0)
Hemoglobin: 6 g/dL — ABNORMAL LOW (ref 12.0–15.0)
MCH: 27.9 pg (ref 26.0–34.0)
MCHC: 29.1 g/dL — ABNORMAL LOW (ref 30.0–36.0)
MCV: 95.8 fL (ref 80.0–100.0)
Platelets: 223 10*3/uL (ref 150–400)
RBC: 2.15 MIL/uL — ABNORMAL LOW (ref 3.87–5.11)
RDW: 25 % — ABNORMAL HIGH (ref 11.5–15.5)
WBC: 14.4 10*3/uL — ABNORMAL HIGH (ref 4.0–10.5)
nRBC: 0 % (ref 0.0–0.2)

## 2023-07-02 LAB — RESP PANEL BY RT-PCR (RSV, FLU A&B, COVID)  RVPGX2
Influenza A by PCR: NEGATIVE
Influenza B by PCR: NEGATIVE
Resp Syncytial Virus by PCR: NEGATIVE
SARS Coronavirus 2 by RT PCR: NEGATIVE

## 2023-07-02 LAB — PROCALCITONIN: Procalcitonin: 0.12 ng/mL

## 2023-07-02 LAB — PREPARE RBC (CROSSMATCH)

## 2023-07-02 MED ORDER — DOXYCYCLINE HYCLATE 100 MG PO TABS: 100.0000 mg | ORAL_TABLET | Freq: Two times a day (BID) | ORAL | Status: DC

## 2023-07-02 MED ORDER — ACETAMINOPHEN 325 MG PO TABS
650.0000 mg | ORAL_TABLET | ORAL | Status: DC | PRN
  Administered 2023-07-07 – 2023-07-09 (×2): 650 mg via ORAL
  Filled 2023-07-02 (×2): qty 2

## 2023-07-02 MED ORDER — HYDRALAZINE HCL 20 MG/ML IJ SOLN: 5.0000 mg | Freq: Four times a day (QID) | INTRAMUSCULAR | Status: DC | PRN

## 2023-07-02 MED ORDER — FERROUS SULFATE 325 (65 FE) MG PO TABS
325.0000 mg | ORAL_TABLET | Freq: Every day | ORAL | Status: DC
  Administered 2023-07-03 – 2023-07-10 (×7): 325 mg via ORAL
  Filled 2023-07-02 (×7): qty 1

## 2023-07-02 MED ORDER — UMECLIDINIUM BROMIDE 62.5 MCG/ACT IN AEPB
1.0000 | INHALATION_SPRAY | Freq: Every day | RESPIRATORY_TRACT | Status: DC
  Administered 2023-07-03 – 2023-07-10 (×7): 1 via RESPIRATORY_TRACT
  Filled 2023-07-02 (×2): qty 7

## 2023-07-02 MED ORDER — SODIUM CHLORIDE 0.9% FLUSH: 3.0000 mL | INTRAVENOUS | Status: DC | PRN

## 2023-07-02 MED ORDER — LORATADINE 10 MG PO TABS
10.0000 mg | ORAL_TABLET | Freq: Every day | ORAL | Status: AC
Start: 2023-07-03 — End: ?
  Administered 2023-07-03 – 2023-07-10 (×7): 10 mg via ORAL
  Filled 2023-07-02 (×7): qty 1

## 2023-07-02 MED ORDER — ASPIRIN 81 MG PO TBEC
81.0000 mg | DELAYED_RELEASE_TABLET | Freq: Every day | ORAL | Status: DC
  Administered 2023-07-02 – 2023-07-10 (×8): 81 mg via ORAL
  Filled 2023-07-02 (×8): qty 1

## 2023-07-02 MED ORDER — CITALOPRAM HYDROBROMIDE 10 MG PO TABS
10.0000 mg | ORAL_TABLET | Freq: Every day | ORAL | Status: DC
  Administered 2023-07-02 – 2023-07-09 (×8): 10 mg via ORAL
  Filled 2023-07-02 (×9): qty 1

## 2023-07-02 MED ORDER — FLUTICASONE PROPIONATE 50 MCG/ACT NA SUSP: 2.0000 | Freq: Every day | NASAL | Status: DC | PRN

## 2023-07-02 MED ORDER — ALBUTEROL SULFATE (2.5 MG/3ML) 0.083% IN NEBU: 2.5000 mg | INHALATION_SOLUTION | RESPIRATORY_TRACT | Status: DC | PRN

## 2023-07-02 MED ORDER — SODIUM CHLORIDE 0.9% FLUSH
3.0000 mL | Freq: Two times a day (BID) | INTRAVENOUS | Status: DC
  Administered 2023-07-02 – 2023-07-10 (×15): 3 mL via INTRAVENOUS

## 2023-07-02 MED ORDER — FLUTICASONE FUROATE-VILANTEROL 100-25 MCG/ACT IN AEPB
1.0000 | INHALATION_SPRAY | Freq: Every day | RESPIRATORY_TRACT | Status: DC
  Administered 2023-07-03 – 2023-07-10 (×7): 1 via RESPIRATORY_TRACT
  Filled 2023-07-02 (×2): qty 28

## 2023-07-02 MED ORDER — IPRATROPIUM-ALBUTEROL 0.5-2.5 (3) MG/3ML IN SOLN
3.0000 mL | Freq: Once | RESPIRATORY_TRACT | Status: AC
  Administered 2023-07-02: 3 mL via RESPIRATORY_TRACT
  Filled 2023-07-02: qty 3

## 2023-07-02 MED ORDER — PANTOPRAZOLE SODIUM 40 MG IV SOLR
40.0000 mg | INTRAVENOUS | Status: AC
  Administered 2023-07-02: 40 mg via INTRAVENOUS
  Filled 2023-07-02 (×2): qty 10

## 2023-07-02 MED ORDER — SODIUM CHLORIDE 0.9 % IV SOLN: 10.0000 mL/h | Freq: Once | INTRAVENOUS | Status: DC

## 2023-07-02 MED ORDER — IPRATROPIUM-ALBUTEROL 0.5-2.5 (3) MG/3ML IN SOLN
3.0000 mL | Freq: Four times a day (QID) | RESPIRATORY_TRACT | Status: DC
  Administered 2023-07-02 – 2023-07-04 (×10): 3 mL via RESPIRATORY_TRACT
  Filled 2023-07-02 (×10): qty 3

## 2023-07-02 MED ORDER — ATORVASTATIN CALCIUM 80 MG PO TABS
80.0000 mg | ORAL_TABLET | Freq: Every day | ORAL | Status: DC
  Administered 2023-07-03 – 2023-07-10 (×7): 80 mg via ORAL
  Filled 2023-07-02 (×6): qty 1
  Filled 2023-07-02: qty 4

## 2023-07-02 MED ORDER — HYDROCODONE-ACETAMINOPHEN 10-325 MG PO TABS
1.0000 | ORAL_TABLET | Freq: Four times a day (QID) | ORAL | Status: DC | PRN
  Administered 2023-07-03 – 2023-07-10 (×26): 1 via ORAL
  Filled 2023-07-02 (×27): qty 1

## 2023-07-02 MED ORDER — IRON SUCROSE 500 MG IVPB - SIMPLE MED: 500.0000 mg | Freq: Once | INTRAVENOUS | Status: DC

## 2023-07-02 MED ORDER — PANTOPRAZOLE SODIUM 40 MG IV SOLR
40.0000 mg | Freq: Two times a day (BID) | INTRAVENOUS | Status: DC
  Administered 2023-07-02 – 2023-07-08 (×12): 40 mg via INTRAVENOUS
  Filled 2023-07-02 (×12): qty 10

## 2023-07-02 MED ORDER — GUAIFENESIN 100 MG/5ML PO LIQD
5.0000 mL | Freq: Four times a day (QID) | ORAL | Status: DC
  Administered 2023-07-02 – 2023-07-10 (×29): 5 mL via ORAL
  Filled 2023-07-02 (×29): qty 10

## 2023-07-02 MED ORDER — SODIUM CHLORIDE 0.9 % IV SOLN: 250.0000 mL | INTRAVENOUS | Status: AC | PRN

## 2023-07-02 MED ORDER — ONDANSETRON HCL 4 MG/2ML IJ SOLN: 4.0000 mg | Freq: Four times a day (QID) | INTRAMUSCULAR | Status: DC | PRN

## 2023-07-02 NOTE — Consult Note (Signed)
Wyline Mood , MD [address], [place], [place], [address] 3940 [address], [place], [place], [address] Phone: [phone]  Fax: [phone]  Consultation  Referring Provider:   Dr. Chipper Herb Primary Care Physician:  Louis Matte, MD Primary Gastroenterologist: None Reason for Consultation: Anemia  Date of Admission:  07/02/2023 Date of Consultation:  07/02/2023         HPI:   Terri Burns is a 73 y.o. female with a history of COPD gastric reflux present to the emergency room with shortness of breath.  Developed earlier this morning.  Saturations were in the 70%.  I been consulted for stool occult that is positive and anemia.  2 weeks back hemoglobin 8.7 g with an MCV of 85 this morning hemoglobin 6 g with an MCV of 95.8.  CMP shows a BUN of 52 and a creatinine of 0.55.  LFTs are normal.  Iron studies in process. Ferritin of twelve 1 month back 06/08/2023 CT angiogram chest shows right lower lobe pulmonary nodule with surrounding groundglass airspace opacity possibly resolving infectionChest x-ray from admission shows patchy nodular ill-defined airspace opacities probable small bilateral pleural effusions.  Review of epic findingsNo recent endoscopic evaluation.   She was recently discharged from the hospital with community-acquired pneumonia COPD exacerbation respiratory failure   I went to see her in the emergency room.  She appeared very lethargic but able to have a conversation finds it hard to complete the conversation before getting short of breath.  On oxygen.  She denies any overt blood loss denies any blood in her stool denies any blood in the urine denies any nosebleeds denies any hematemesis denies any NSAID use  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  well  differentiated    Hyperlipidemia    Hypertension    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    Past Surgical History:  Procedure Laterality Date   CORONARY STENT INTERVENTION N/A 02/22/2021   Procedure: CORONARY STENT INTERVENTION;  Surgeon: Iran Ouch, MD;  Location: [facility] INVASIVE CV [facility];  Service: Cardiovascular;  Laterality: N/A;   LEFT HEART CATH AND CORONARY ANGIOGRAPHY N/A 02/22/2021   Procedure: LEFT HEART CATH AND CORONARY ANGIOGRAPHY;  Surgeon: Iran Ouch, MD;  Location: [facility] INVASIVE CV LAB;  Service: Cardiovascular;  Laterality: N/A;   NECK SURGERY N/A 10/2003   SHOULDER SURGERY Left 2004    Prior to Admission medications   Medication Sig Start Date End Date Taking? Authorizing Provider  ACCU-CHEK GUIDE test strip USE AS DIRECTED TO Check blood glucose 03/25/22   [provider]  Accu-Chek Softclix Lancets lancets as directed. 03/25/22   [provider]  albuterol (PROVENTIL) (2.5 MG/3ML) 0.083% nebulizer solution Take 3 mLs (2.5 mg total) by nebulization 4 (four) times daily. 01/07/22   Calvert Cantor, MD  albuterol (VENTOLIN HFA) 108 (90 Base) MCG/ACT inhaler Inhale 2 puffs into the lungs every 4 (four) hours as needed for wheezing or shortness of breath. 05/20/23   Shalhoub, Deno Lunger, MD  amLODipine (NORVASC) 10 MG tablet Take 1 tablet (10 mg total) by mouth daily. 01/07/22 06/08/23  Calvert Cantor, MD  aspirin 81 MG EC tablet TAKE 1 TABLET BY MOUTH EVERY DAY 04/22/19   Alba Cory, MD  atorvastatin (  LIPITOR) 40 MG tablet Take 2 tablets (80 mg total) by mouth daily. 09/07/21   Sowles, Danna Hefty, MD  BRILINTA 90 MG TABS tablet TAKE ONE TABLET BY MOUTH EVERY MORNING and TAKE ONE TABLET BY MOUTH EVERYDAY AT BEDTIME 02/15/22   Iran Ouch, MD  citalopram (CELEXA) 20 MG tablet TAKE 1 AND 1/2 TABLETS BY MOUTH EVERY EVENING 12/08/21   Alba Cory, MD  cyanocobalamin 1000 MCG tablet Take 1 tablet (1,000 mcg total) by mouth daily.  05/21/23   Shalhoub, Deno Lunger, MD  ferrous sulfate 325 (65 FE) MG tablet Take 1 tablet (325 mg total) by mouth daily before breakfast. Take on an empty stomach with one cup of juice, preferably orange juice 05/20/23   Shalhoub, Deno Lunger, MD  fluticasone (FLONASE) 50 MCG/ACT nasal spray PLACE TWO SPRAYS into BOTH nostrils DAILY Patient taking differently: Place 2 sprays into both nostrils daily as needed. PLACE TWO SPRAYS into BOTH nostrils DAILY 12/03/21   Alba Cory, MD  Fluticasone-Umeclidin-Vilant (TRELEGY ELLIPTA) 100-62.5-25 MCG/ACT AEPB Inhale 1 puff into the lungs daily. 05/20/23   Shalhoub, Deno Lunger, MD  HYDROcodone-acetaminophen (NORCO) 10-325 MG tablet Take 1 tablet by mouth 4 (four) times daily as needed. 06/01/23   [provider]  ipratropium-albuterol (DUONEB) 0.5-2.5 (3) MG/3ML SOLN Take 3 mLs by nebulization every 6 (six) hours as needed. 03/11/22   [provider]  isosorbide mononitrate (IMDUR) 30 MG 24 hr tablet TAKE 1/2 TABLET BY MOUTH ONCE DAILY 12/06/21   Alba Cory, MD  loratadine (ALLERGY RELIEF) 10 MG tablet Take 1 tablet (10 mg total) by mouth daily. 09/07/21   Alba Cory, MD  losartan (COZAAR) 100 MG tablet Take 1 tablet (100 mg total) by mouth every evening. Patient taking differently: Take 100 mg by mouth daily. 10/05/21   Alba Cory, MD  metoprolol succinate (TOPROL-XL) 25 MG 24 hr tablet Take 1 tablet (25 mg total) by mouth every evening. Patient taking differently: Take 25 mg by mouth daily. 10/05/21   Alba Cory, MD  NARCAN 4 MG/0.1ML LIQD nasal spray kit 1 spray as needed. As needed in case of overdose 04/13/21   [provider]  pantoprazole (PROTONIX) 40 MG tablet Take 1 tablet (40 mg total) by mouth every evening. Patient taking differently: Take 40 mg by mouth daily. 10/05/21   Alba Cory, MD    Family History  Problem Relation Age of Onset   Hypertension Mother    Stroke Mother    Heart disease Father     Diabetes Father    Heart disease Daughter    Pulmonary embolism Daughter    Hypertension Son    Breast cancer Neg Hx      Social History   Tobacco Use   Smoking status: Every Day    Current packs/day: 0.25    Average packs/day: 0.3 packs/day for 50.0 years (12.5 ttl pk-yrs)    Types: Cigarettes    Start date: 12/10/1973   Smokeless tobacco: Never   Tobacco comments:    1-2 ciggs per day - 04/13/2021; has a hx of 2 PPD x 10 years   Vaping Use   Vaping status: Never Used  Substance Use Topics   Alcohol use: Yes    Alcohol/week: 0.0 standard drinks of alcohol    Comment: occasionally   Drug use: No    Allergies as of 07/02/2023 - Review Complete 07/02/2023  Allergen Reaction Noted   Levofloxacin Shortness Of Breath 05/12/2015   Penicillins Hives 05/12/2015   Meloxicam  01/01/2018   Nsaids Other (See Comments) 08/20/2018    Review of Systems:    All systems reviewed and negative except where noted in HPI.   Physical Exam:  Vital signs in last 24 hours: Temp:  [98.3 F (36.8 C)] 98.3 F (36.8 C) (01/26 1009) Pulse Rate:  [76-100] 79 (01/26 1100) Resp:  [14-83] 14 (01/26 1100) BP: (93-100)/(63-65) 98/65 (01/26 1100) SpO2:  [70 %-100 %] 100 % (01/26 1100) FiO2 (%):  [50 %] 50 % (01/26 1043) Weight:  [54.9 kg] 54.9 kg (01/26 1012)   General: Appears tired but alert Head:  Normocephalic and atraumatic. Eyes:   No icterus.   Conjunctiva pink. PERRLA. Ears:  Normal auditory acuity. Neck:  Supple; no masses or thyroidomegaly Lungs: Decreased air entry bilateral no added breath sounds Heart:  Regular rate and rhythm;  Without murmur, clicks, rubs or gallops Abdomen:  Soft, nondistended, nontender. Normal bowel sounds. No appreciable masses or hepatomegaly.  No rebound or guarding.  Neurologic:  Alert and oriented x3;  grossly normal neurologically. Skin:  Intact without significant lesions or rashes. Cervical Nodes:  No significant cervical adenopathy. Psych:  Alert and  cooperative. Normal affect.  LAB RESULTS: Recent Labs    07/02/23 1010  WBC 14.4*  HGB 6.0*  HCT 20.6*  PLT 223   BMET Recent Labs    07/02/23 1010  NA 137  K 4.8  CL 108  CO2 21*  GLUCOSE 132*  BUN 52*  CREATININE 0.55  CALCIUM 8.5*   LFT Recent Labs    07/02/23 1010  PROT 4.9*  ALBUMIN 2.7*  AST 22  ALT 26  ALKPHOS 32*  BILITOT 0.5   PT/INR No results for input(s): "LABPROT", "INR" in the last 72 hours.  STUDIES: DG Chest Portable 1 View Result Date: 07/02/2023 CLINICAL DATA:  Shortness of breath. EXAM: PORTABLE CHEST 1 VIEW COMPARISON:  06/16/2023 FINDINGS: Cardiopericardial silhouette is at upper limits of normal for size. Interstitial markings are diffusely coarsened with chronic features. Patchy/nodular ill-defined airspace opacities described previously in the left lung persist. There is some minimal atelectasis at the right base. Probable small bilateral pleural effusions. Bones are diffusely demineralized. Telemetry leads overlie the chest. IMPRESSION: 1. Patchy/nodular ill-defined airspace opacities described previously in the left lung persist. 2. Probable small bilateral pleural effusions. Electronically Signed   By: Kennith Center M.D.   On: 07/02/2023 10:42      Impression / Plan:   Terri Burns is a 73 y.o. y/o female with a tree of COPD recent community-acquired pneumonia discharged on 14 January respiratory failure on oxygen admitted with shortness of breath found to have a hemoglobin of 6 g with normal MCV.  Iron studies 1 month back showed a ferritin of 12 suggesting iron deficiency.  No active bleeding.  Stool has been tested for blood which is inappropriate as it is a test for colon cancer screening and does not rule in or rule out active gastrointestinal bleeding in an acute setting.  Plan 1.  Consider IV iron, check B12 folate ,celiac serology consider ruling out blood in the urine as a source of blood loss  2.  With recent respiratory  failure community-acquired pneumonia abnormal chest x-ray would not rush into any endoscopy procedure.  We should see how she does after her blood transfusion reassess her respiratory status and determine timing of EGD and colonoscopy.  At present her respiratory status would not justify an elective endoscopy procedure due to the risks of anesthesia.  She gets short of breath before she can complete a sentence.  Dr. Servando Snare will be following the patient from tomorrow and will decide on timing of the procedure based on patient progress.    Thank you for involving me in the care of this patient.      LOS: 0 days   Wyline Mood, MD  07/02/2023, 11:58 AM

## 2023-07-02 NOTE — ED Provider Notes (Signed)
[facility] Provider Note    Event Date/Time   First MD Initiated Contact with Patient 07/02/23 1005     (approximate)  History   Chief Complaint: Respiratory Distress  HPI  Terri Burns is a 73 y.o. female with a past medical history of COPD, gastric reflux, hypertension, hyperlipidemia arrives to the emergency department via emergency traffic for shortness of breath.  Per patient and EMS report around 7:00 this morning she developed worsening shortness of breath, used her albuterol treatment without relief.  EMS states when they arrived patient's O2 sats were in the 70s patient was given 2 DuoNebs as well as 125 mg of Solu-Medrol and brought to the emergency department on CPAP.  EMS states shortly before arrival patient's blood pressure had dropped so they discontinued the CPAP.  Patient satting around 90% on a nonrebreather placed on 40 L high flow nasal cannula now satting 100%.  Patient is in no respiratory distress and states she is feeling better.  Physical Exam   Triage Vital Signs: ED Triage Vitals  Encounter Vitals Group     BP 07/02/23 1009 93/64     Systolic BP Percentile --      Diastolic BP Percentile --      Pulse Rate 07/02/23 1009 100     Resp 07/02/23 1009 (!) 83     Temp 07/02/23 1009 98.3 F (36.8 C)     Temp src --      SpO2 07/02/23 1004 (!) 70 %     Weight 07/02/23 1012 121 lb 0.5 oz (54.9 kg)     Height 07/02/23 1012 4\' 10"  (1.473 m)     Head Circumference --      Peak Flow --      Pain Score 07/02/23 1012 0     Pain Loc --      Pain Education --      Exclude from Growth Chart --     Most recent vital signs: Vitals:   07/02/23 1004 07/02/23 1009  BP:  93/64  Pulse:  100  Resp:  (!) 83  Temp:  98.3 F (36.8 C)  SpO2: (!) 70% 100%    General: Awake, no distress.  Frail-appearing. CV:  Good peripheral perfusion.  Regular rate and rhythm  Resp:  Mild tachypnea around 25-30 breaths/min.  Patient has minimal wheeze.  No  rales or rhonchi. Abd:  No distention.  Soft, nontender.  No rebound or guarding.   ED Results / Procedures / Treatments   EKG  EKG viewed and interpreted by myself shows a sinus rhythm 84 bpm with a narrow QRS, normal axis, normal intervals, nonspecific ST changes.  RADIOLOGY  I have reviewed and interpreted the chest x-ray images.  No obvious consolidation seen on my evaluation. Radiology has read the x-ray as patchy nodular air space opacities seen on prior chest x-ray as well.   MEDICATIONS ORDERED IN ED: Medications  ipratropium-albuterol (DUONEB) 0.5-2.5 (3) MG/3ML nebulizer solution 3 mL (3 mLs Nebulization Given 07/02/23 1040)  ipratropium-albuterol (DUONEB) 0.5-2.5 (3) MG/3ML nebulizer solution 3 mL (3 mLs Nebulization Given 07/02/23 1040)     IMPRESSION / MDM / ASSESSMENT AND PLAN / ED COURSE  I reviewed the triage vital signs and the nursing notes.  Patient's presentation is most consistent with acute presentation with potential threat to life or bodily function.  Patient presents to the emergency department via emergency traffic for worsening shortness of breath since 7:00 this morning.  Patient has  a history of COPD but no baseline O2 requirement.  Per EMS satting 70% on room air, she satting closer to 90% on nonrebreather placed on high flow nasal cannula 40 L/min patient satting 100% currently.  Patient is in no distress lying in bed she is calm and cooperative. Patient's workup has begun to result showing fairly significant anemia.  Hemoglobin of 6.0 today however her baseline appears to be around 8.5.  We will check the remainder of the labs including cardiac enzymes.  Chest x-ray and continue to closely monitor.  I have ordered a unit of packed red blood cells for the patient.  Rectal examination shows melena grossly positive on guaiac.  I have ordered 1 unit of packed red blood cells.  Patient's chemistry shows no significant findings.  Troponin reassuringly negative.   We will admit the patient to the hospital service for ongoing workup and treatment.  CRITICAL CARE Performed by: Minna Antis   Total critical care time: 30 minutes  Critical care time was exclusive of separately billable procedures and treating other patients.  Critical care was necessary to treat or prevent imminent or life-threatening deterioration.  Critical care was time spent personally by me on the following activities: development of treatment plan with patient and/or surrogate as well as nursing, discussions with consultants, evaluation of patient's response to treatment, examination of patient, obtaining history from patient or surrogate, ordering and performing treatments and interventions, ordering and review of laboratory studies, ordering and review of radiographic studies, pulse oximetry and re-evaluation of patient's condition.   FINAL CLINICAL IMPRESSION(S) / ED DIAGNOSES   Symptomatic anemia Dyspnea Hypoxia   Note:  This document was prepared using Dragon voice recognition software and may include unintentional dictation errors.   Minna Antis, MD 07/02/23 1059

## 2023-07-02 NOTE — H&P (Signed)
History and Physical    EVELIN CAKE YNW:[id number] DOB: 1950/09/07 DOA: 07/02/2023  PCP: Louis Matte, MD (Confirm with patient/family/NH records and if not entered, this has to be entered at [facility] point of entry) Patient coming from: Home  I have personally briefly reviewed patient's old medical records in [facility]  Chief Complaint: Cough wheezing shortness of breath leg swelling  HPI: Terri Burns is a 73 y.o. female with medical history significant of COPD, chronic HFpEF, CAD status post stenting on Brilinta, iron deficiency anemia, anxiety/depression, GERD, HTN, HLD, presented with worsening of cough wheezing shortness of breath and malaise.  Patient was recently hospitalized for COPD and pneumonia and discharged home about 2 weeks ago.  She said that she has been feeling fine until about 2 to 3 days ago, patient started develop malaise and lethargy with low energy and easily get tired.  And starting from yesterday, patient started develop new productive cough with " creamy" phlegm, denied any chest pain no fever chills denied any orthopnea.  She also noticed increasing bilateral ankle swelling.  This morning she called EMS.  EMS arrived and found the patient in profound hypoxia O2 saturation 70%, and patient was placed on NRB.  According to EMS patient also had wheezing and was given Solu-Medrol x 1 and DuoNebs x 2 en route to hospital.  Last month, patient was diagnosed with iron deficiency anemia and started on p.o. iron.  She said since then her stool color has been dark.  She denied any abdominal pain and she does not take any NSAIDs, she said none alcohol drinker.  She takes Brilinta chronically and last time she had stent was more than 10 years ago. ED Course: Blood pressure on the lower side 93/60 on arrival afebrile, nontachycardic, tachypneic and O2 saturation 70% on room air and stabilized on 4 L of high flow oxygen.  Chest x-ray showed patchy infiltrate bilaterally  left more than right.  Blood work showed WBC 14.4, hemoglobin 6.0 compared to baseline 8.0-9.0, BUN 53, creatinine 0.5, K4.8.  Patient was given breathing treatment and PPI infusion in the ED and 1 PRBC x 1 ordered.  Review of Systems: As per HPI otherwise 14 point review of systems negative.    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    Hyperlipidemia    Hypertension    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    Past Surgical History:  Procedure Laterality Date   CORONARY STENT INTERVENTION N/A 02/22/2021   Procedure: CORONARY STENT INTERVENTION;  Surgeon: Iran Ouch, MD;  Location: [facility] INVASIVE CV LAB;  Service: Cardiovascular;  Laterality: N/A;   LEFT HEART CATH AND CORONARY ANGIOGRAPHY N/A 02/22/2021   Procedure: LEFT HEART CATH AND CORONARY ANGIOGRAPHY;  Surgeon: Iran Ouch, MD;  Location: [facility] INVASIVE CV LAB;  Service: Cardiovascular;  Laterality: N/A;   NECK SURGERY N/A 10/2003   SHOULDER SURGERY Left 2004     reports that she has been smoking cigarettes. She started smoking about 49 years ago. She has a 12.5 pack-year smoking history. She has never used smokeless tobacco. She reports current alcohol use. She reports that she does not use drugs.  Allergies  Allergen Reactions   Levofloxacin Shortness Of Breath  Penicillins Hives    itching   Meloxicam     dizziness   Nsaids Other (See Comments)    Patient states she can tolerate up to three doses per day without incident    Family History  Problem Relation Age of Onset   Hypertension Mother    Stroke Mother    Heart disease Father    Diabetes Father    Heart disease Daughter    Pulmonary embolism Daughter    Hypertension Son    Breast cancer Neg Hx      Prior to  Admission medications   Medication Sig Start Date End Date Taking? Authorizing Provider  ACCU-CHEK GUIDE test strip USE AS DIRECTED TO Check blood glucose 03/25/22   [provider]  Accu-Chek Softclix Lancets lancets as directed. 03/25/22   [provider]  albuterol (PROVENTIL) (2.5 MG/3ML) 0.083% nebulizer solution Take 3 mLs (2.5 mg total) by nebulization 4 (four) times daily. 01/07/22   Calvert Cantor, MD  albuterol (VENTOLIN HFA) 108 (90 Base) MCG/ACT inhaler Inhale 2 puffs into the lungs every 4 (four) hours as needed for wheezing or shortness of breath. 05/20/23   Shalhoub, Deno Lunger, MD  amLODipine (NORVASC) 10 MG tablet Take 1 tablet (10 mg total) by mouth daily. 01/07/22 06/08/23  Calvert Cantor, MD  aspirin 81 MG EC tablet TAKE 1 TABLET BY MOUTH EVERY DAY 04/22/19   Alba Cory, MD  atorvastatin (LIPITOR) 40 MG tablet Take 2 tablets (80 mg total) by mouth daily. 09/07/21   Sowles, Danna Hefty, MD  BRILINTA 90 MG TABS tablet TAKE ONE TABLET BY MOUTH EVERY MORNING and TAKE ONE TABLET BY MOUTH EVERYDAY AT BEDTIME 02/15/22   Iran Ouch, MD  citalopram (CELEXA) 20 MG tablet TAKE 1 AND 1/2 TABLETS BY MOUTH EVERY EVENING 12/08/21   Alba Cory, MD  cyanocobalamin 1000 MCG tablet Take 1 tablet (1,000 mcg total) by mouth daily. 05/21/23   Shalhoub, Deno Lunger, MD  ferrous sulfate 325 (65 FE) MG tablet Take 1 tablet (325 mg total) by mouth daily before breakfast. Take on an empty stomach with one cup of juice, preferably orange juice 05/20/23   Shalhoub, Deno Lunger, MD  fluticasone (FLONASE) 50 MCG/ACT nasal spray PLACE TWO SPRAYS into BOTH nostrils DAILY Patient taking differently: Place 2 sprays into both nostrils daily as needed. PLACE TWO SPRAYS into BOTH nostrils DAILY 12/03/21   Alba Cory, MD  Fluticasone-Umeclidin-Vilant (TRELEGY ELLIPTA) 100-62.5-25 MCG/ACT AEPB Inhale 1 puff into the lungs daily. 05/20/23   Shalhoub, Deno Lunger, MD  HYDROcodone-acetaminophen (NORCO) 10-325 MG  tablet Take 1 tablet by mouth 4 (four) times daily as needed. 06/01/23   [provider]  ipratropium-albuterol (DUONEB) 0.5-2.5 (3) MG/3ML SOLN Take 3 mLs by nebulization every 6 (six) hours as needed. 03/11/22   [provider]  isosorbide mononitrate (IMDUR) 30 MG 24 hr tablet TAKE 1/2 TABLET BY MOUTH ONCE DAILY 12/06/21   Alba Cory, MD  loratadine (ALLERGY RELIEF) 10 MG tablet Take 1 tablet (10 mg total) by mouth daily. 09/07/21   Alba Cory, MD  losartan (COZAAR) 100 MG tablet Take 1 tablet (100 mg total) by mouth every evening. Patient taking differently: Take 100 mg by mouth daily. 10/05/21   Alba Cory, MD  metoprolol succinate (TOPROL-XL) 25 MG 24 hr tablet Take 1 tablet (25 mg total) by mouth every evening. Patient taking differently: Take 25 mg by mouth daily. 10/05/21   Alba Cory, MD  NARCAN 4 MG/0.1ML LIQD nasal spray kit 1  spray as needed. As needed in case of overdose 04/13/21   [provider]  pantoprazole (PROTONIX) 40 MG tablet Take 1 tablet (40 mg total) by mouth every evening. Patient taking differently: Take 40 mg by mouth daily. 10/05/21   Alba Cory, MD    Physical Exam: Vitals:   07/02/23 1015 07/02/23 1044 07/02/23 1100 07/02/23 1200  BP: 100/63  98/65 98/61  Pulse: 76  79 87  Resp: 17 (!) 23 14 (!) 21  Temp:      SpO2: 100%  100% 100%  Weight:      Height:        Constitutional: NAD, calm, comfortable Vitals:   07/02/23 1015 07/02/23 1044 07/02/23 1100 07/02/23 1200  BP: 100/63  98/65 98/61  Pulse: 76  79 87  Resp: 17 (!) 23 14 (!) 21  Temp:      SpO2: 100%  100% 100%  Weight:      Height:       Eyes: PERRL, lids and conjunctivae normal ENMT: Mucous membranes are moist. Posterior pharynx clear of any exudate or lesions.Normal dentition.  Neck: normal, supple, no masses, no thyromegaly Respiratory: clear to auscultation bilaterally, scattered wheezing, fine crackles on bilateral lower fields, increasing  respiratory effort. No accessory muscle use.  Cardiovascular: Regular rate and rhythm, no murmurs / rubs / gallops.  2+ extremity edema. 2+ pedal pulses. No carotid bruits.  Abdomen: no tenderness, no masses palpated. No hepatosplenomegaly. Bowel sounds positive.  Musculoskeletal: no clubbing / cyanosis. No joint deformity upper and lower extremities. Good ROM, no contractures. Normal muscle tone.  Skin: no rashes, lesions, ulcers. No induration Neurologic: CN 2-12 grossly intact. Sensation intact, DTR normal. Strength 5/5 in all 4.  Psychiatric: Normal judgment and insight. Alert and oriented x 3. Normal mood.     Labs on Admission: I have personally reviewed following labs and imaging studies  CBC: Recent Labs  Lab 07/02/23 1010  WBC 14.4*  HGB 6.0*  HCT 20.6*  MCV 95.8  PLT 223   Basic Metabolic Panel: Recent Labs  Lab 07/02/23 1010  NA 137  K 4.8  CL 108  CO2 21*  GLUCOSE 132*  BUN 52*  CREATININE 0.55  CALCIUM 8.5*   GFR: Estimated Creatinine Clearance: 47.3 mL/min (by C-G formula based on SCr of 0.55 mg/dL). Liver Function Tests: Recent Labs  Lab 07/02/23 1010  AST 22  ALT 26  ALKPHOS 32*  BILITOT 0.5  PROT 4.9*  ALBUMIN 2.7*   No results for input(s): "LIPASE", "AMYLASE" in the last 168 hours. No results for input(s): "AMMONIA" in the last 168 hours. Coagulation Profile: No results for input(s): "INR", "PROTIME" in the last 168 hours. Cardiac Enzymes: No results for input(s): "CKTOTAL", "CKMB", "CKMBINDEX", "TROPONINI" in the last 168 hours. BNP (last 3 results) No results for input(s): "PROBNP" in the last 8760 hours. HbA1C: No results for input(s): "HGBA1C" in the last 72 hours. CBG: No results for input(s): "GLUCAP" in the last 168 hours. Lipid Profile: No results for input(s): "CHOL", "HDL", "LDLCALC", "TRIG", "CHOLHDL", "LDLDIRECT" in the last 72 hours. Thyroid Function Tests: No results for input(s): "TSH", "T4TOTAL", "FREET4", "T3FREE",  "THYROIDAB" in the last 72 hours. Anemia Panel: Recent Labs    07/02/23 1010  FERRITIN 27  TIBC 276  IRON 71   Urine analysis:    Component Value Date/Time   COLORURINE STRAW (A) 02/02/2021 1507   APPEARANCEUR CLEAR (A) 02/02/2021 1507   LABSPEC 1.010 02/02/2021 1507   PHURINE 6.0  02/02/2021 1507   GLUCOSEU NEGATIVE 02/02/2021 1507   HGBUR SMALL (A) 02/02/2021 1507   BILIRUBINUR NEGATIVE 02/02/2021 1507   KETONESUR NEGATIVE 02/02/2021 1507   PROTEINUR NEGATIVE 02/02/2021 1507   NITRITE NEGATIVE 02/02/2021 1507   LEUKOCYTESUR NEGATIVE 02/02/2021 1507    Radiological Exams on Admission: DG Chest Portable 1 View Result Date: 07/02/2023 CLINICAL DATA:  Shortness of breath. EXAM: PORTABLE CHEST 1 VIEW COMPARISON:  06/16/2023 FINDINGS: Cardiopericardial silhouette is at upper limits of normal for size. Interstitial markings are diffusely coarsened with chronic features. Patchy/nodular ill-defined airspace opacities described previously in the left lung persist. There is some minimal atelectasis at the right base. Probable small bilateral pleural effusions. Bones are diffusely demineralized. Telemetry leads overlie the chest. IMPRESSION: 1. Patchy/nodular ill-defined airspace opacities described previously in the left lung persist. 2. Probable small bilateral pleural effusions. Electronically Signed   By: Kennith Center M.D.   On: 07/02/2023 10:42    EKG: Pending  Assessment/Plan Principal Problem:   GI bleed Active Problems:   Coronary artery calcification of native artery   Acute hypoxemic respiratory failure (HCC)   Acute on chronic diastolic CHF (congestive heart failure) (HCC)  (please populate well all problems here in Problem List. (For example, if patient is on BP meds at home and you resume or decide to hold them, it is a problem that needs to be her. Same for CAD, COPD, HLD and so on)  Acute hypoxic respiratory failure -Multifactorial, the main driving force appears to be  acute on chronic HFpEF decompensation/high-output CHF secondary to symptomatic anemia, in addition, might also have a early COPD exacerbation. -Currently, blood pressure too low to initiate IV diuresis.  Patient is to get PRBC x 1, and will recheck drainage this afternoon, if BP allows, will consider initiate IV diuresis. -PRBC x 1 and recheck H&H this afternoon and tonight.  Consider further transfusion according to H&H trending and symptoms and signs of further CHF decompensation such as further hypoxia or further hypotension.  Acute on chronic HFpEF decompensation -As above  COPD exacerbation -Given there is a persistent infiltrates on the chest x-ray, will initiate doxycycline.  Noted that the patient received a full course of ceftriaxone and azithromycin 2 weeks ago to treat similar CAP course. -Continue ICS and LABA -DuoNebs and as needed albuterol -Incentive symmetry and flutter valve  Acute on chronic symptomatic anemia, iron deficiency GI bleed, acute on chronic likely upper GI -Increase of BUN/creatinine implying upper GI bleed -Discussed with on-call GI Dr. Tobi Bastos. Agreed with correct anemia and CHF and improve hypoxia first before considering GI intervention. -PPI twice daily -N.p.o. until H&H stabilized.  CAD -Continue aspirin, hold off Brilinta -Continue statin and hold off home BP meds  HTN -Hold off home BP meds -Start as needed hydralazine  DVT prophylaxis: Hose Code Status: Full code Family Communication: None at bedside Disposition Plan: Patient is sick with GI bleed as well as CHF decompensation and symptomatic anemia requiring inpatient GI consult infiltration and close monitoring further GI bleed and requiring high flow oxygen with careful titration inpatient, expect more than 2 midnight hospital stay Consults called: GI Admission status: PCU admit   Emeline General MD [facility] Pager [phone]  07/02/2023, 12:38 PM

## 2023-07-02 NOTE — Progress Notes (Signed)
*  PRELIMINARY RESULTS* Echocardiogram 2D Echocardiogram has been performed.  Terri Burns 07/02/2023, 1:33 PM

## 2023-07-02 NOTE — Progress Notes (Signed)
Procal level low, will D/C ABX and further PNA workups.

## 2023-07-02 NOTE — ED Triage Notes (Signed)
Pt to ED via [facility] from home for shortness of breath that started this morning around 0700. Pt used her albuterol neb treatment without relief. Per EMS when they arrived pt's O2 sats were in the 70's on room air. Pt was given 2 duoneb treatments by EMS as well as 125 mg of Solu-medrol. Pt placed on CPAP with sats improving to 95-96%, however, pts BP starting dropping so she was taken off.   Pt is currently on High Flow [place] at 40 liters at 50% FiO2.

## 2023-07-03 DIAGNOSIS — K922 Gastrointestinal hemorrhage, unspecified: Secondary | ICD-10-CM

## 2023-07-03 DIAGNOSIS — J9601 Acute respiratory failure with hypoxia: Secondary | ICD-10-CM | POA: Diagnosis not present

## 2023-07-03 DIAGNOSIS — I5033 Acute on chronic diastolic (congestive) heart failure: Secondary | ICD-10-CM

## 2023-07-03 DIAGNOSIS — D649 Anemia, unspecified: Secondary | ICD-10-CM | POA: Diagnosis not present

## 2023-07-03 LAB — BASIC METABOLIC PANEL
Anion gap: 7 (ref 5–15)
BUN: 30 mg/dL — ABNORMAL HIGH (ref 8–23)
CO2: 24 mmol/L (ref 22–32)
Calcium: 8.2 mg/dL — ABNORMAL LOW (ref 8.9–10.3)
Chloride: 112 mmol/L — ABNORMAL HIGH (ref 98–111)
Creatinine, Ser: 0.42 mg/dL — ABNORMAL LOW (ref 0.44–1.00)
GFR, Estimated: >60 mL/min (ref >60)
Glucose, Bld: 117 mg/dL — ABNORMAL HIGH (ref 70–99)
Potassium: 4 mmol/L (ref 3.5–5.1)
Sodium: 143 mmol/L (ref 135–145)

## 2023-07-03 LAB — CBC
HCT: 18.1 % — ABNORMAL LOW (ref 36.0–46.0)
Hemoglobin: 5.8 g/dL — ABNORMAL LOW (ref 12.0–15.0)
MCH: 28.6 pg (ref 26.0–34.0)
MCHC: 32 g/dL (ref 30.0–36.0)
MCV: 89.2 fL (ref 80.0–100.0)
Platelets: 149 10*3/uL — ABNORMAL LOW (ref 150–400)
RBC: 2.03 MIL/uL — ABNORMAL LOW (ref 3.87–5.11)
RDW: 23.1 % — ABNORMAL HIGH (ref 11.5–15.5)
WBC: 7.8 10*3/uL (ref 4.0–10.5)
nRBC: 0 % (ref 0.0–0.2)

## 2023-07-03 LAB — RETICULOCYTES
Immature Retic Fract: 35 % — ABNORMAL HIGH (ref 2.3–15.9)
RBC.: 2.33 MIL/uL — ABNORMAL LOW (ref 3.87–5.11)
Retic Count, Absolute: 104.6 10*3/uL (ref 19.0–186.0)
Retic Ct Pct: 4.5 % — ABNORMAL HIGH (ref 0.4–3.1)

## 2023-07-03 LAB — HEMOGLOBIN AND HEMATOCRIT, BLOOD
HCT: 20.6 % — ABNORMAL LOW (ref 36.0–46.0)
HCT: 22.6 % — ABNORMAL LOW (ref 36.0–46.0)
HCT: 23.3 % — ABNORMAL LOW (ref 36.0–46.0)
Hemoglobin: 6.6 g/dL — ABNORMAL LOW (ref 12.0–15.0)
Hemoglobin: 7.5 g/dL — ABNORMAL LOW (ref 12.0–15.0)
Hemoglobin: 7.6 g/dL — ABNORMAL LOW (ref 12.0–15.0)

## 2023-07-03 LAB — EXPECTORATED SPUTUM ASSESSMENT W GRAM STAIN, RFLX TO RESP C

## 2023-07-03 LAB — PREPARE RBC (CROSSMATCH)

## 2023-07-03 NOTE — Progress Notes (Signed)
Midge Minium, MD [facility]   [address], [address] [place], [place] [address] Phone: [phone] Fax : [phone]   Subjective: The patient was seen this morning in the ED with continued need for oxygen but earlier this admission the patient had a saturation of 70%.  The patient has been found to have anemia with a hemoglobin of 8.7 three weeks ago.  On admission the hemoglobin was 6.0 and this morning it was 5.8.  The patient is presently receiving a unit of blood.  The patient was reported to be very lethargic yesterday when seen by Dr. Tobi Bastos but is much more awake this morning and is able to have a conversation.  The patient and her son report that the patient continues to smoke cigarettes and has had multiple admissions for shortness of breath and low pulse oximetry recently.  Although yesterday the patient denied NSAID use, today she admits to taking Motrin on a regular basis.  She also denies ever being seen by GI for an EGD or colonoscopy. The patient denies any abdominal pain nausea vomiting or bloody stools.  She does state that she has some dark stools but thought that was due to the iron she has been taking.   Objective: Vital signs in last 24 hours: Vitals:   07/03/23 0608 07/03/23 0743 07/03/23 0749 07/03/23 0804  BP:   129/82 124/85  Pulse:  82 82 92  Resp:  17 17 16   Temp: 98.3 F (36.8 C) 97.9 F (36.6 C) 97.9 F (36.6 C) 98 F (36.7 C)  TempSrc: Oral Oral Oral Oral  SpO2:  100%  100%  Weight:      Height:       Weight change:   Intake/Output Summary (Last 24 hours) at 07/03/2023 1013 Last data filed at 07/02/2023 1732 Gross per 24 hour  Intake 320 ml  Output --  Net 320 ml     Exam: Heart:: Regular rate and rhythm or without murmur or extra heart sounds Lungs: expiratory rhonchi Abdomen: soft, nontender, normal bowel sounds   Lab Results: @LABTEST2 @ Micro Results: Recent Results (from the past 240 hours)  Resp panel by RT-PCR (RSV, Flu A&B, Covid)  Anterior Nasal Swab     Status: None   Collection Time: 07/02/23 10:17 AM   Specimen: Anterior Nasal Swab  Result Value Ref Range Status   SARS Coronavirus 2 by RT PCR NEGATIVE NEGATIVE Final    Comment: (NOTE) SARS-CoV-2 target nucleic acids are NOT DETECTED.  The SARS-CoV-2 RNA is generally detectable in upper respiratory specimens during the acute phase of infection. The lowest concentration of SARS-CoV-2 viral copies this assay can detect is 138 copies/mL. A negative result does not preclude SARS-Cov-2 infection and should not be used as the sole basis for treatment or other patient management decisions. A negative result may occur with  improper specimen collection/handling, submission of specimen other than nasopharyngeal swab, presence of viral mutation(s) within the areas targeted by this assay, and inadequate number of viral copies(<138 copies/mL). A negative result must be combined with clinical observations, patient history, and epidemiological information. The expected result is Negative.  Fact Sheet for Patients:  BloggerCourse.com  Fact Sheet for Healthcare Providers:  SeriousBroker.it  This test is no t yet approved or cleared by the [place] FDA and  has been authorized for detection and/or diagnosis of SARS-CoV-2 by FDA under an Emergency Use Authorization (EUA). This EUA will remain  in effect (meaning this test can be used) for the  duration of the COVID-19 declaration under Section 564(b)(1) of the Act, 21 U.S.C.section 360bbb-3(b)(1), unless the authorization is terminated  or revoked sooner.       Influenza A by PCR NEGATIVE NEGATIVE Final   Influenza B by PCR NEGATIVE NEGATIVE Final    Comment: (NOTE) The Xpert Xpress SARS-CoV-2/FLU/RSV plus assay is intended as an aid in the diagnosis of influenza from Nasopharyngeal swab specimens and should not be used as a sole basis for treatment. Nasal washings  and aspirates are unacceptable for Xpert Xpress SARS-CoV-2/FLU/RSV testing.  Fact Sheet for Patients: BloggerCourse.com  Fact Sheet for Healthcare Providers: SeriousBroker.it  This test is not yet approved or cleared by the [place] FDA and has been authorized for detection and/or diagnosis of SARS-CoV-2 by FDA under an Emergency Use Authorization (EUA). This EUA will remain in effect (meaning this test can be used) for the duration of the COVID-19 declaration under Section 564(b)(1) of the Act, 21 U.S.C. section 360bbb-3(b)(1), unless the authorization is terminated or revoked.     Resp Syncytial Virus by PCR NEGATIVE NEGATIVE Final    Comment: (NOTE) Fact Sheet for Patients: BloggerCourse.com  Fact Sheet for Healthcare Providers: SeriousBroker.it  This test is not yet approved or cleared by the [place] FDA and has been authorized for detection and/or diagnosis of SARS-CoV-2 by FDA under an Emergency Use Authorization (EUA). This EUA will remain in effect (meaning this test can be used) for the duration of the COVID-19 declaration under Section 564(b)(1) of the Act, 21 U.S.C. section 360bbb-3(b)(1), unless the authorization is terminated or revoked.  Performed at [facility], [address] [place], [place] [address]   Expectorated Sputum Assessment w Gram Stain, Rflx to Resp Cult     Status: None   Collection Time: 07/02/23 11:55 PM   Specimen: Sputum  Result Value Ref Range Status   Specimen Description SPUTUM  Final   Special Requests NONE  Final   Sputum evaluation   Final    THIS SPECIMEN IS ACCEPTABLE FOR SPUTUM CULTURE Performed at [facility], [address], [place], [place] [address]    Report Status 07/03/2023 FINAL  Final  Culture, Respiratory w Gram Stain     Status: None (Preliminary result)   Collection Time: 07/02/23  11:55 PM   Specimen: SPU  Result Value Ref Range Status   Specimen Description   Final    SPUTUM Performed at [facility], [address] [place], [place] [address]    Special Requests   Final    NONE Reflexed from [id number] Performed at [facility], [address] [place], [place] [address]    Gram Stain   Final    RARE WBC PRESENT, PREDOMINANTLY PMN FEW GRAM POSITIVE COCCI Performed at [facility], [address] [place], [place] [address]    Culture PENDING  Incomplete   Report Status PENDING  Incomplete   Studies/Results: ECHOCARDIOGRAM COMPLETE Result Date: 07/02/2023    ECHOCARDIOGRAM REPORT   Patient Name:   RABECCA BIRGE Date of Exam: 07/02/2023 Medical Rec #:  [id number]      Height:       58.0 in Accession #:    [id number]     Weight:       121.0 lb Date of Birth:  Jan 12, 1951      BSA:          1.471 m Patient Age:    72 years       BP:  93/64 mmHg Patient Gender: F              HR:           97 bpm. Exam Location:  [facility] Procedure: 2D Echo, Cardiac Doppler and Color Doppler Indications:     CHF-Acute Diastolic I50.31  History:         Patient has prior history of Echocardiogram examinations. Risk                  Factors:Hypertension.  Sonographer:     Neysa Bonito Roar Referring Phys:  [id number] Emeline General Diagnosing Phys: Chilton Si MD  Sonographer Comments: Technically difficult study due to poor echo windows and echo performed with patient supine and on artificial respirator. IMPRESSIONS  1. Left ventricular ejection fraction, by estimation, is 60 to 65%. The left ventricle has normal function. The left ventricle has no regional wall motion abnormalities. Left ventricular diastolic parameters are consistent with Grade I diastolic dysfunction (impaired relaxation).  2. Right ventricular systolic function is normal. The right ventricular size is normal.  3. The mitral valve is normal in structure. No evidence of mitral valve regurgitation. No  evidence of mitral stenosis.  4. The aortic valve is normal in structure. Aortic valve regurgitation is not visualized. No aortic stenosis is present. FINDINGS  Left Ventricle: Left ventricular ejection fraction, by estimation, is 60 to 65%. The left ventricle has normal function. The left ventricle has no regional wall motion abnormalities. The left ventricular internal cavity size was normal in size. There is  no left ventricular hypertrophy. Left ventricular diastolic parameters are consistent with Grade I diastolic dysfunction (impaired relaxation). Right Ventricle: The right ventricular size is normal. No increase in right ventricular wall thickness. Right ventricular systolic function is normal. Left Atrium: Left atrial size was normal in size. Right Atrium: Right atrial size was normal in size. Pericardium: There is no evidence of pericardial effusion. Mitral Valve: The mitral valve is normal in structure. No evidence of mitral valve regurgitation. No evidence of mitral valve stenosis. MV peak gradient, 6.7 mmHg. The mean mitral valve gradient is 2.0 mmHg. Tricuspid Valve: The tricuspid valve is normal in structure. Tricuspid valve regurgitation is not demonstrated. No evidence of tricuspid stenosis. Aortic Valve: The aortic valve is normal in structure. Aortic valve regurgitation is not visualized. No aortic stenosis is present. Aortic valve mean gradient measures 5.0 mmHg. Aortic valve peak gradient measures 8.8 mmHg. Aortic valve area, by VTI measures 2.86 cm. Pulmonic Valve: The pulmonic valve was normal in structure. Pulmonic valve regurgitation is not visualized. No evidence of pulmonic stenosis. Aorta: The aortic root is normal in size and structure. Venous: The inferior vena cava was not well visualized. IAS/Shunts: No atrial level shunt detected by color flow Doppler.  LEFT VENTRICLE PLAX 2D LVIDd:         3.50 cm   Diastology LVIDs:         2.40 cm   LV e' medial:    9.25 cm/s LV PW:         0.80  cm   LV E/e' medial:  6.5 LV IVS:        0.90 cm   LV e' lateral:   7.51 cm/s LVOT diam:     1.70 cm   LV E/e' lateral: 8.0 LV SV:         65 LV SV Index:   44 LVOT Area:     2.27 cm  RIGHT VENTRICLE RV Basal diam:  3.00 cm RV Mid diam:    2.50 cm RV S prime:     16.90 cm/s TAPSE (M-mode): 1.9 cm LEFT ATRIUM             Index        RIGHT ATRIUM          Index LA diam:        3.20 cm 2.18 cm/m   RA Area:     9.79 cm LA Vol (A2C):   41.1 ml 27.94 ml/m  RA Volume:   18.60 ml 12.64 ml/m LA Vol (A4C):   28.1 ml 19.10 ml/m LA Biplane Vol: 37.2 ml 25.29 ml/m  AORTIC VALVE                     PULMONIC VALVE AV Area (Vmax):    2.13 cm      PV Vmax:        1.41 m/s AV Area (Vmean):   1.82 cm      PV Peak grad:   8.0 mmHg AV Area (VTI):     2.86 cm      RVOT Peak grad: 4 mmHg AV Vmax:           148.00 cm/s AV Vmean:          103.000 cm/s AV VTI:            0.227 m AV Peak Grad:      8.8 mmHg AV Mean Grad:      5.0 mmHg LVOT Vmax:         139.00 cm/s LVOT Vmean:        82.600 cm/s LVOT VTI:          0.286 m LVOT/AV VTI ratio: 1.26  AORTA Ao Root diam: 2.70 cm Ao Asc diam:  2.80 cm MITRAL VALVE MV Area (PHT): 5.93 cm     SHUNTS MV Area VTI:   3.06 cm     Systemic VTI:  0.29 m MV Peak grad:  6.7 mmHg     Systemic Diam: 1.70 cm MV Mean grad:  2.0 mmHg MV Vmax:       1.29 m/s MV Vmean:      69.2 cm/s MV Decel Time: 128 msec MV E velocity: 60.00 cm/s MV A velocity: 102.00 cm/s MV E/A ratio:  0.59 MV A Prime:    13.3 cm/s Chilton Si MD Electronically signed by Chilton Si MD Signature Date/Time: 07/02/2023/1:38:47 PM    Final    DG Chest Portable 1 View Result Date: 07/02/2023 CLINICAL DATA:  Shortness of breath. EXAM: PORTABLE CHEST 1 VIEW COMPARISON:  06/16/2023 FINDINGS: Cardiopericardial silhouette is at upper limits of normal for size. Interstitial markings are diffusely coarsened with chronic features. Patchy/nodular ill-defined airspace opacities described previously in the left lung persist. There is  some minimal atelectasis at the right base. Probable small bilateral pleural effusions. Bones are diffusely demineralized. Telemetry leads overlie the chest. IMPRESSION: 1. Patchy/nodular ill-defined airspace opacities described previously in the left lung persist. 2. Probable small bilateral pleural effusions. Electronically Signed   By: Kennith Center M.D.   On: 07/02/2023 10:42   Medications: I have reviewed the patient's current medications. Scheduled Meds:  aspirin EC  81 mg Oral Daily   atorvastatin  80 mg Oral Daily   citalopram  10 mg Oral QHS   ferrous sulfate  325 mg Oral QAC breakfast   fluticasone furoate-vilanterol  1 puff Inhalation Daily   And  umeclidinium bromide  1 puff Inhalation Daily   guaiFENesin  5 mL Oral Q6H   ipratropium-albuterol  3 mL Nebulization Q6H   loratadine  10 mg Oral Daily   pantoprazole (PROTONIX) IV  40 mg Intravenous Q12H   sodium chloride flush  3 mL Intravenous Q12H   Continuous Infusions:  sodium chloride     sodium chloride     PRN Meds:.sodium chloride, acetaminophen, albuterol, fluticasone, hydrALAZINE, HYDROcodone-acetaminophen, ondansetron (ZOFRAN) IV, sodium chloride flush   Assessment: Principal Problem:   GI bleed Active Problems:   Coronary artery calcification of native artery   Acute hypoxemic respiratory failure (HCC)   Acute on chronic diastolic CHF (congestive heart failure) (HCC)    Plan: This patient came in with profound anemia now getting a blood transfusion.  The patient's respiratory status appears much better than it was yesterday and she is pulse oxing now 100% on oxygen.  The patient is not on oxygen at home which seems to be a concern of the patient's son.  As for her GI issues the patient will need a workup for the cause of her anemia.  If the patient continues to improve after the blood transfusion and is looking better tomorrow then we can start a prep tomorrow for an EGD and colonoscopy the following day.  The  patient and her son have been explained our plan for proceeding with an EGD and colonoscopy when she is better and agree with it.   LOS: 1 day   Midge Minium, MD.FACG 07/03/2023, 10:13 AM Pager [phone] 7am-5pm  Check AMION for 5pm -7am coverage and on weekends

## 2023-07-03 NOTE — Progress Notes (Signed)
PT Cancellation Note  Patient Details Name: Terri Burns MRN: [id number] DOB: 1951-04-29   Cancelled Treatment:    Reason Eval/Treat Not Completed: Patient not medically ready. Chart reviewed. Pt hgb 5.8 this am, currently getting blood transfusion. Will attempt PT evaluation at later date/time pending improvement in Hgb within therapy parameters for participation.    Olga Coaster PT, DPT 10:47 AM,07/03/23

## 2023-07-03 NOTE — Progress Notes (Signed)
OT Cancellation Note  Patient Details Name: Terri Burns MRN: [id number] DOB: [birth year]   Cancelled Treatment:    Reason Eval/Treat Not Completed: Patient not medically ready. Chart reviewed. Pt hgb 5.8 this am, currently getting blood transfusion. Will attempt OT evaluation at later date/time pending improvement in Hgb within therapy parameters for participation.  Arman Filter., MPH, MS, OTR/L ascom [phone] 07/03/23, 10:19 AM

## 2023-07-03 NOTE — Progress Notes (Signed)
       CROSS COVER NOTE  NAME: Terri Burns MRN: [id number] DOB : [birth year]    Date of Service   07/03/2023   HPI/Events of Note   Hgb 5.8 this morning after unit transfused yesterday. No reports of rectal bleeding or hematemesis over night.   Interventions   Plan: 2 Units PRBCs ordered     Boubacar Lerette Lamin Geradine Girt, MSN, APRN, AGACNP-BC [facility] [place] Pager: [phone]. Check Amion for Availability

## 2023-07-03 NOTE — Progress Notes (Signed)
Progress Note   Patient: Terri Burns:[id number] DOB: 1951-05-04 DOA: 07/02/2023     1 DOS: the patient was seen and examined on 07/03/2023   Brief hospital course:  HPI on admission "LARINDA HERTER is a 73 y.o. female with medical history significant of COPD, chronic HFpEF, CAD status post stenting on Brilinta, iron deficiency anemia, anxiety/depression, GERD, HTN, HLD, presented with worsening of cough wheezing shortness of breath and malaise. "   See H&P for full HPI on admission & ED course.  Pt was admitted for further evaluation and management of mild COPD exacerbation and symptomatic acute on chronic iron deficiency anemia requiring blood transfusion.  GI consulted.  Further hospital course and management as outlined below.   Assessment and Plan:  Acute hypoxic respiratory failure Multifactorial, the main driving force appears to be acute on chronic HFpEF decompensation/high-output CHF secondary to symptomatic anemia, possibly concurrent mild COPD exacerbation. --No diuresis initiated on admission due to soft BP's --Monitor volume status close --Mgmt of undelying issues as outlined --Wean O2 as tolerated, target sats < 88% --Diuresis as needed and as BP tolerates  Acute on chronic HFpEF decompensation -As above   COPD with mild acute exacerbation Persistent infiltrates on the chest x-ray, but pt recently completed full course of ceftriaxone and azithromycin 2 weeks ago to treat similar CAP course. --No antibiotics indicated, procal negative --Continue ICS and LABA --DuoNebs and as needed albuterol --Incentive symmetry and flutter valve --Monitor closely and consider adding systemic steroid if progressing severity   Acute on chronic symptomatic anemia, iron deficiency GI bleed, acute on chronic likely upper GI Increase of BUN/creatinine implying upper GI bleed --GI is consulted --Endoscopic evaluation is deferred pending improved respiratory status --IV PPI twice  daily --Diet per GI   CAD --Continue aspirin, hold off Brilinta --Continue statin and hold off home BP meds   HTN --Hold off home BP meds --As needed hydralazine      Subjective: Pt seen in ED holding for a bed today.  She reports still feeling short of breath.  No other complaints.  Physical Exam: Vitals:   07/03/23 1300 07/03/23 1330 07/03/23 1400 07/03/23 1430  BP: 119/84 123/73 132/75 131/81  Pulse: 96 95 96 97  Resp: 16 (!) 24 (!) 22 14  Temp:      TempSrc:      SpO2: 100% 100% 100% 100%  Weight:      Height:       General exam: awake, alert, no acute distress, chronically ill appearing HEENT: moist mucus membranes, hearing grossly normal  Respiratory system: lungs diminished throughout with expiratory wheezes, norhonchi, normal respiratory effort at rest on 8 L/min [place] o2. Cardiovascular system: normal S1/S2, RRR, no JVD, murmurs, rubs, gallops, no pedal edema.   Gastrointestinal system: soft, NT, ND, no HSM felt, +bowel sounds. Central nervous system: A&O x 2+. no gross focal neurologic deficits, normal speech Skin: dry, intact, normal temperature Psychiatry: normal mood, flat affect   Data Reviewed:  Notable labs --  Cl 112 Glucose 117 BUN 30 Cr 0.42 Ca 8.2  Hbg trens 6.6 >> 5.8 >> 7.5  Family Communication: None present. Will attempt to call as time allows.  Disposition: Status is: Inpatient Remains inpatient appropriate because: ongoing GI evaluation and high oxygen requirements   Planned Discharge Destination: Home    Time spent: 52 minutes including time at bedside and in coordination of care with staff and consultants  Author: Pennie Banter, DO 07/03/2023 3:57 PM  For  on call review [email or web address].

## 2023-07-04 ENCOUNTER — Encounter: Payer: Self-pay | Admitting: Internal Medicine

## 2023-07-04 DIAGNOSIS — J9601 Acute respiratory failure with hypoxia: Secondary | ICD-10-CM | POA: Diagnosis not present

## 2023-07-04 DIAGNOSIS — K922 Gastrointestinal hemorrhage, unspecified: Secondary | ICD-10-CM | POA: Diagnosis not present

## 2023-07-04 DIAGNOSIS — I5033 Acute on chronic diastolic (congestive) heart failure: Secondary | ICD-10-CM | POA: Diagnosis not present

## 2023-07-04 DIAGNOSIS — D649 Anemia, unspecified: Secondary | ICD-10-CM | POA: Diagnosis not present

## 2023-07-04 LAB — BASIC METABOLIC PANEL
Anion gap: 4 — ABNORMAL LOW (ref 5–15)
BUN: 16 mg/dL (ref 8–23)
CO2: 25 mmol/L (ref 22–32)
Calcium: 7.9 mg/dL — ABNORMAL LOW (ref 8.9–10.3)
Chloride: 110 mmol/L (ref 98–111)
Creatinine, Ser: 0.56 mg/dL (ref 0.44–1.00)
GFR, Estimated: >60 mL/min (ref >60)
Glucose, Bld: 86 mg/dL (ref 70–99)
Potassium: 4.2 mmol/L (ref 3.5–5.1)
Sodium: 139 mmol/L (ref 135–145)

## 2023-07-04 LAB — HEMOGLOBIN AND HEMATOCRIT, BLOOD
HCT: 22.9 % — ABNORMAL LOW (ref 36.0–46.0)
HCT: 22 % — ABNORMAL LOW (ref 36.0–46.0)
HCT: 22.2 % — ABNORMAL LOW (ref 36.0–46.0)
Hemoglobin: 7.4 g/dL — ABNORMAL LOW (ref 12.0–15.0)
Hemoglobin: 7.2 g/dL — ABNORMAL LOW (ref 12.0–15.0)
Hemoglobin: 7.2 g/dL — ABNORMAL LOW (ref 12.0–15.0)

## 2023-07-04 LAB — CBC
HCT: 21.4 % — ABNORMAL LOW (ref 36.0–46.0)
Hemoglobin: 7.2 g/dL — ABNORMAL LOW (ref 12.0–15.0)
MCH: 29.6 pg (ref 26.0–34.0)
MCHC: 33.6 g/dL (ref 30.0–36.0)
MCV: 88.1 fL (ref 80.0–100.0)
Platelets: 119 10*3/uL — ABNORMAL LOW (ref 150–400)
RBC: 2.43 MIL/uL — ABNORMAL LOW (ref 3.87–5.11)
RDW: 21.4 % — ABNORMAL HIGH (ref 11.5–15.5)
WBC: 6.7 10*3/uL (ref 4.0–10.5)
nRBC: 0 % (ref 0.0–0.2)

## 2023-07-04 MED ORDER — IPRATROPIUM-ALBUTEROL 0.5-2.5 (3) MG/3ML IN SOLN
3.0000 mL | Freq: Three times a day (TID) | RESPIRATORY_TRACT | Status: DC
  Administered 2023-07-05 – 2023-07-10 (×16): 3 mL via RESPIRATORY_TRACT
  Filled 2023-07-04 (×17): qty 3

## 2023-07-04 NOTE — Progress Notes (Signed)
Midge Minium, MD [facility]   [address] [place], [place] [address] Phone: [phone] Fax : [phone]   Subjective: This patient was admitted with a significant GI bleed with a hemoglobin of 5.8.  The patient had also been admitted with COPD exacerbation and respiratory distress.  The patient is doing much better today.  Her blood count was 7.6 yesterday and this morning was 7.2 with a repeat late this morning of 7.4.  She denies seeing any bright red blood per rectum or black stools.   Objective: Vital signs in last 24 hours: Vitals:   07/04/23 0408 07/04/23 0728 07/04/23 0805 07/04/23 1454  BP: 138/75  109/72   Pulse: 85  79   Resp: 16  16   Temp: 97.6 F (36.4 C)  98.3 F (36.8 C)   TempSrc: Oral  Oral   SpO2: 100% 100% 97% 99%  Weight:      Height:       Weight change:   Intake/Output Summary (Last 24 hours) at 07/04/2023 1503 Last data filed at 07/04/2023 1211 Gross per 24 hour  Intake 1560 ml  Output 750 ml  Net 810 ml     Exam: Heart:: Regular rate and rhythm or without murmur or extra heart sounds Lungs: normal and clear to auscultation and percussion Abdomen: soft, nontender, normal bowel sounds   Lab Results: @LABTEST2 @ Micro Results: Recent Results (from the past 240 hours)  Resp panel by RT-PCR (RSV, Flu A&B, Covid) Anterior Nasal Swab     Status: None   Collection Time: 07/02/23 10:17 AM   Specimen: Anterior Nasal Swab  Result Value Ref Range Status   SARS Coronavirus 2 by RT PCR NEGATIVE NEGATIVE Final    Comment: (NOTE) SARS-CoV-2 target nucleic acids are NOT DETECTED.  The SARS-CoV-2 RNA is generally detectable in upper respiratory specimens during the acute phase of infection. The lowest concentration of SARS-CoV-2 viral copies this assay can detect is 138 copies/mL. A negative result does not preclude SARS-Cov-2 infection and should not be used as the sole basis for treatment or other patient management decisions. A negative  result may occur with  improper specimen collection/handling, submission of specimen other than nasopharyngeal swab, presence of viral mutation(s) within the areas targeted by this assay, and inadequate number of viral copies(<138 copies/mL). A negative result must be combined with clinical observations, patient history, and epidemiological information. The expected result is Negative.  Fact Sheet for Patients:  BloggerCourse.com  Fact Sheet for Healthcare Providers:  SeriousBroker.it  This test is no t yet approved or cleared by the [place] FDA and  has been authorized for detection and/or diagnosis of SARS-CoV-2 by FDA under an Emergency Use Authorization (EUA). This EUA will remain  in effect (meaning this test can be used) for the duration of the COVID-19 declaration under Section 564(b)(1) of the Act, 21 U.S.C.section 360bbb-3(b)(1), unless the authorization is terminated  or revoked sooner.       Influenza A by PCR NEGATIVE NEGATIVE Final   Influenza B by PCR NEGATIVE NEGATIVE Final    Comment: (NOTE) The Xpert Xpress SARS-CoV-2/FLU/RSV plus assay is intended as an aid in the diagnosis of influenza from Nasopharyngeal swab specimens and should not be used as a sole basis for treatment. Nasal washings and aspirates are unacceptable for Xpert Xpress SARS-CoV-2/FLU/RSV testing.  Fact Sheet for Patients: BloggerCourse.com  Fact Sheet for Healthcare Providers: SeriousBroker.it  This test is not yet approved or cleared by the [place] FDA  and has been authorized for detection and/or diagnosis of SARS-CoV-2 by FDA under an Emergency Use Authorization (EUA). This EUA will remain in effect (meaning this test can be used) for the duration of the COVID-19 declaration under Section 564(b)(1) of the Act, 21 U.S.C. section 360bbb-3(b)(1), unless the authorization is  terminated or revoked.     Resp Syncytial Virus by PCR NEGATIVE NEGATIVE Final    Comment: (NOTE) Fact Sheet for Patients: BloggerCourse.com  Fact Sheet for Healthcare Providers: SeriousBroker.it  This test is not yet approved or cleared by the [place] FDA and has been authorized for detection and/or diagnosis of SARS-CoV-2 by FDA under an Emergency Use Authorization (EUA). This EUA will remain in effect (meaning this test can be used) for the duration of the COVID-19 declaration under Section 564(b)(1) of the Act, 21 U.S.C. section 360bbb-3(b)(1), unless the authorization is terminated or revoked.  Performed at [facility], [address] [place], [place] [address]   Expectorated Sputum Assessment w Gram Stain, Rflx to Resp Cult     Status: None   Collection Time: 07/02/23 11:55 PM   Specimen: Sputum  Result Value Ref Range Status   Specimen Description SPUTUM  Final   Special Requests NONE  Final   Sputum evaluation   Final    THIS SPECIMEN IS ACCEPTABLE FOR SPUTUM CULTURE Performed at [facility], [address], [place], [place] [address]    Report Status 07/03/2023 FINAL  Final  Culture, Respiratory w Gram Stain     Status: None (Preliminary result)   Collection Time: 07/02/23 11:55 PM   Specimen: SPU  Result Value Ref Range Status   Specimen Description   Final    SPUTUM Performed at [facility], [address] [place], [place] [address]    Special Requests   Final    NONE Reflexed from [id number] Performed at [facility], [address] [place], [place] [address]    Gram Stain   Final    RARE WBC PRESENT, PREDOMINANTLY PMN FEW GRAM POSITIVE COCCI    Culture   Final    CULTURE REINCUBATED FOR BETTER GROWTH Performed at [facility], [address], [place], [place] [address]    Report Status PENDING  Incomplete   Studies/Results: No results  found. Medications: I have reviewed the patient's current medications. Scheduled Meds:  aspirin EC  81 mg Oral Daily   atorvastatin  80 mg Oral Daily   citalopram  10 mg Oral QHS   ferrous sulfate  325 mg Oral QAC breakfast   fluticasone furoate-vilanterol  1 puff Inhalation Daily   And   umeclidinium bromide  1 puff Inhalation Daily   guaiFENesin  5 mL Oral Q6H   ipratropium-albuterol  3 mL Nebulization Q6H   loratadine  10 mg Oral Daily   pantoprazole (PROTONIX) IV  40 mg Intravenous Q12H   sodium chloride flush  3 mL Intravenous Q12H   Continuous Infusions:  sodium chloride     PRN Meds:.acetaminophen, albuterol, fluticasone, hydrALAZINE, HYDROcodone-acetaminophen, ondansetron (ZOFRAN) IV, sodium chloride flush   Assessment: Principal Problem:   GI bleed Active Problems:   Coronary artery calcification of native artery   Acute hypoxemic respiratory failure (HCC)   Acute on chronic diastolic CHF (congestive heart failure) (HCC)    Plan: This patient has been doing well since admission and has been told that I would recommend an EGD and colonoscopy for her to look for the cause of her significant anemia.  The patient states that she is feeling better today but not strong enough to go through with this tomorrow or to take the prep today.  She asked that I give her another day before any further procedures.  We have agreed that I will see her tomorrow and we will discuss whether or not we pursue a EGD and colonoscopy.  The patient has been explained the plan and agrees with it.   LOS: 2 days   Midge Minium, MD.FACG 07/04/2023, 3:03 PM Pager [phone] 7am-5pm  Check AMION for 5pm -7am coverage and on weekends

## 2023-07-04 NOTE — Progress Notes (Signed)
Progress Note   Patient: Terri Burns ZOX:[id number] DOB: [birth year] DOA: 07/02/2023     2 DOS: the patient was seen and examined on 07/04/2023   Brief hospital course:  HPI on admission "Terri Burns is a 73 y.o. female with medical history significant of COPD, chronic HFpEF, CAD status post stenting on Brilinta, iron deficiency anemia, anxiety/depression, GERD, HTN, HLD, presented with worsening of cough wheezing shortness of breath and malaise. "   See H&P for full HPI on admission & ED course.  Pt was admitted for further evaluation and management of mild COPD exacerbation and symptomatic acute on chronic iron deficiency anemia requiring blood transfusion.  GI consulted.  Further hospital course and management as outlined below.   Assessment and Plan:  Acute hypoxic respiratory failure Multifactorial, the main driving force appears to be symptomatic anemia/highoutput HF, possibly concurrent mild COPD exacerbation. 1/28 - weaned from 8 L O2 yesterday >> 4 L today --No diuresis initiated on admission due to soft BP's --Monitor volume status closely --Mgmt of undelying issues as outlined --Wean O2 as tolerated, target sats < 88% --Diuresis as needed and as BP tolerates  Acute on chronic HFpEF decompensation -As above   COPD with mild acute exacerbation Persistent infiltrates on the chest x-ray, but pt recently completed full course of ceftriaxone and azithromycin 2 weeks ago to treat similar CAP course. --No antibiotics indicated, procal negative --Continue ICS and LABA --DuoNebs and as needed albuterol --Incentive symmetry and flutter valve --Monitor closely and consider adding systemic steroid if progressing severity   Acute on chronic symptomatic anemia, iron deficiency GI bleed, acute on chronic likely upper GI Increase of BUN/creatinine implying upper GI bleed --GI is consulted --Endoscopic evaluation is deferred pending improved respiratory status --IV PPI twice  daily --Diet per GI   CAD --Continue aspirin, hold off Brilinta --Continue statin and hold off home BP meds   HTN --Hold off home BP meds --As needed hydralazine      Subjective: Pt seen awake sitting up in bed this AM. Reports her breathing feels better today, still a little short when up to use bathroom.  Overall improved.  Denies bleeding.   Physical Exam: Vitals:   07/04/23 0728 07/04/23 0805 07/04/23 1454 07/04/23 1700  BP:  109/72  110/68  Pulse:  79  80  Resp:  16  18  Temp:  98.3 F (36.8 C)  98.2 F (36.8 C)  TempSrc:  Oral  Oral  SpO2: 100% 97% 99% 98%  Weight:      Height:       General exam: awake, alert, no acute distress, chronically ill appearing HEENT: moist mucus membranes, hearing grossly normal  Respiratory system: improved aeration, no active wheezing, no rhonchi, normal respiratory effort at rest on 2 L/min [place] o2. Cardiovascular system: normal S1/S2, RRR, no pedal edema.   Gastrointestinal system: soft, NT, ND Central nervous system: A&O x 3. no gross focal neurologic deficits, normal speech Skin: dry, intact, normal temperature Psychiatry: normal mood, flat affect   Data Reviewed:  Notable labs --   Hbg trend 5.8 >> 7.5 >> 7.6 >> 7.2 >> 7.4 >> 7.2  Platelets 149 >> 119k   Gap 4, Ca 7.9 otherwise normal BMP   Family Communication: None present. Will attempt to call as time allows.  Disposition: Status is: Inpatient Remains inpatient appropriate because: ongoing GI evaluation, monitoring anemia closely, still requiring oxygen     Planned Discharge Destination: Home    Time spent: 42 minutes  Author: Pennie Banter, DO 07/04/2023 7:28 PM  For on call review [email or web address].

## 2023-07-04 NOTE — Plan of Care (Signed)
  Problem: Education: Goal: Knowledge of General Education information will improve Description: Including pain rating scale, medication(s)/side effects and non-pharmacologic comfort measures Outcome: Progressing   Problem: Clinical Measurements: Goal: Will remain free from infection Outcome: Progressing Goal: Diagnostic test results will improve Outcome: Progressing Goal: Respiratory complications will improve Outcome: Progressing Goal: Cardiovascular complication will be avoided Outcome: Progressing   Problem: Nutrition: Goal: Adequate nutrition will be maintained Outcome: Progressing   Problem: Coping: Goal: Level of anxiety will decrease Outcome: Progressing   Problem: Elimination: Goal: Will not experience complications related to bowel motility Outcome: Progressing   Problem: Pain Managment: Goal: General experience of comfort will improve and/or be controlled Outcome: Progressing   Problem: Safety: Goal: Ability to remain free from injury will improve Outcome: Progressing   Problem: Skin Integrity: Goal: Risk for impaired skin integrity will decrease Outcome: Progressing

## 2023-07-04 NOTE — Progress Notes (Signed)
Heart Failure Navigator Progress Note  Assessed for Heart & Vascular TOC clinic readiness.  Patient does not meet criteria due to current admission related to anemia and acute hypoxemic respiratory failure rather than CHF.  I did use the opportunity to review CHF education with patient while I was interviewing her.  No HF TOC appointment scheduled as patient says she will schedule follow-up with [facility] after discharge.  Education Assessment and Provision:  Detailed education and instructions provided on heart failure disease management including the following:  Signs and symptoms of Heart Failure When to call the physician Importance of daily weights Low sodium diet Fluid restriction Medication management Anticipated future follow-up appointments-  Patient education given on each of the above topics.  Patient acknowledges understanding via teach back method and acceptance of all instructions.  Patient has scale at home: She is not currently doing daily weights. Patient has pill box at home: Yes    Navigator will sign off at this time.  Roxy Horseman, RN, BSN [facility] Heart Failure Navigator Secure Chat Only

## 2023-07-04 NOTE — Evaluation (Signed)
Physical Therapy Evaluation Patient Details Name: Terri Burns MRN: [id number] DOB: 10/14/1950 Today's Date: 07/04/2023  History of Present Illness  Pt is a 73 y.o. female presenting to [facility] ED with worsening of cough, wheezing, SOB and malaise. Found to have GI bleed, CHF, acute hypoxic respiratory failure, COPD exacerbation and chronic symptomatic anemia. PMH significant for chronic HFpEF, CAD status post stenting on Brilinta, iron deficiency anemia, anxiety/depression, GERD, HTN, & HLD.   Clinical Impression  Pt A&Ox4, reported 8/10 LBP, RN aware per patient and planning medication at 1000am. Pt stated at baseline she lives in a one floor apartment, will utilize her rollator outside the home intermittently, has a friend that assists with IADLs but is normally modI/I for ADLs and mobility.  She demonstrated supine to sit with supervision, extra time. Placed on room air and spO2 readings >90% throughout mobility but pt noticeable SOB/fatigue, so 2L donned, RN notified of pt status (initially on 4L). She was able to step pivot to [facility] and recliner, CGA but definite need of BUE support and fatigued quickly. Left with OT at bedside to progress to some ambulation.  Overall the patient demonstrated deficits (see "PT Problem List") that impede the patient's functional abilities, safety, and mobility and would benefit from skilled PT intervention.        If plan is discharge home, recommend the following: A little help with walking and/or transfers;A little help with bathing/dressing/bathroom;Assist for transportation;Help with stairs or ramp for entrance;Assistance with cooking/housework   Can travel by private vehicle   Yes    Equipment Recommendations None recommended by PT  Recommendations for Other Services       Functional Status Assessment Patient has had a recent decline in their functional status and demonstrates the ability to make significant improvements in function in a reasonable  and predictable amount of time.     Precautions / Restrictions Precautions Precautions: Fall Precaution Comments: watch O2 Restrictions Weight Bearing Restrictions Per Provider Order: No      Mobility  Bed Mobility Overal bed mobility: Needs Assistance Bed Mobility: Supine to Sit     Supine to sit: Supervision, Used rails, HOB elevated          Transfers Overall transfer level: Needs assistance Equipment used: Rolling walker (2 wheels), None Transfers: Sit to/from Stand Sit to Stand: Contact guard assist   Step pivot transfers: Contact guard assist       General transfer comment: sit <> stand from [facility] with RW, CGA, step pivot to [facility] without AD but required BUE support. able to step pivot to recliner in room with RW, CGA. decreased tolerance noted    Ambulation/Gait                  Stairs            Wheelchair Mobility     Tilt Bed    Modified Rankin (Stroke Patients Only)       Balance Overall balance assessment: Needs assistance Sitting-balance support: Feet supported, No upper extremity supported Sitting balance-Leahy Scale: Good       Standing balance-Leahy Scale: Good                               Pertinent Vitals/Pain Pain Assessment Pain Assessment: 0-10 Pain Score: 8  Pain Location: Chronic back pain per patient Pain Descriptors / Indicators: Sore, Aching Pain Intervention(s): Limited activity within patient's tolerance, Monitored during session, Repositioned  Home Living Family/patient expects to be discharged to:: Private residence Living Arrangements: Alone Available Help at Discharge: Family;Available PRN/intermittently;Friend(s) Type of Home: Apartment Home Access: Ramped entrance       Home Layout: One level Home Equipment: Shower seat;Toilet riser;Rollator (4 wheels)      Prior Function Prior Level of Function : Independent/Modified Independent             Mobility Comments: Has a  rollator she uses on occasion for community mobility. ADLs Comments: IND with ADLs, friends/family assist with driving. Otherwise independent for ADL management. Recently started [facility] OT/PT.     Extremity/Trunk Assessment   Upper Extremity Assessment Upper Extremity Assessment: Generalized weakness    Lower Extremity Assessment Lower Extremity Assessment: Generalized weakness       Communication   Communication Communication: (P) No apparent difficulties  Cognition Arousal: Alert Behavior During Therapy: WFL for tasks assessed/performed Overall Cognitive Status: Within Functional Limits for tasks assessed                                          General Comments      Exercises Other Exercises Other Exercises: able to perform pericare in sitting supervision, don mesh underwear in sitting   Assessment/Plan    PT Assessment Patient needs continued PT services  PT Problem List Decreased strength;Cardiopulmonary status limiting activity;Decreased range of motion;Decreased activity tolerance;Decreased knowledge of use of DME;Decreased safety awareness;Decreased balance;Decreased mobility;Decreased knowledge of precautions       PT Treatment Interventions DME instruction;Therapeutic exercise;Gait training;Balance training;Stair training;Neuromuscular re-education;Functional mobility training;Therapeutic activities;Patient/family education;Modalities    PT Goals (Current goals can be found in the Care Plan section)  Acute Rehab PT Goals Patient Stated Goal: to feel better PT Goal Formulation: With patient Time For Goal Achievement: 07/18/23 Potential to Achieve Goals: Good    Frequency Min 1X/week     Co-evaluation               AM-PAC PT "6 Clicks" Mobility  Outcome Measure Help needed turning from your back to your side while in a flat bed without using bedrails?: None Help needed moving from lying on your back to sitting on the side of a flat  bed without using bedrails?: None Help needed moving to and from a bed to a chair (including a wheelchair)?: None Help needed standing up from a chair using your arms (e.g., wheelchair or bedside chair)?: None Help needed to walk in hospital room?: A Little Help needed climbing 3-5 steps with a railing? : A Little 6 Click Score: 22    End of Session Equipment Utilized During Treatment: Oxygen (4L - room air to 2L) Activity Tolerance: Patient tolerated treatment well Patient left: with call bell/phone within reach;in chair;Other (comment) (with OT at bedsdie) Nurse Communication: Mobility status PT Visit Diagnosis: Muscle weakness (generalized) (M62.81);Unsteadiness on feet (R26.81);Difficulty in walking, not elsewhere classified (R26.2)    Time: 7846-9629 PT Time Calculation (min) (ACUTE ONLY): 14 min   Charges:   PT Evaluation $PT Eval Moderate Complexity: 1 Mod PT Treatments $Therapeutic Activity: 8-22 mins PT General Charges $$ ACUTE PT VISIT: 1 Visit        Olga Coaster PT, DPT 1:10 PM,07/04/23

## 2023-07-04 NOTE — Evaluation (Signed)
Occupational Therapy Evaluation Patient Details Name: Terri Burns MRN: [id number] DOB: [birth year] Today's Date: 07/04/2023   History of Present Illness Pt is a 73 y.o. female presenting to [facility] ED with worsening of cough, wheezing, SOB and malaise. Found to have GI bleed, CHF, acute hypoxic respiratory failure, COPD exacerbation and chronic symptomatic anemia. PMH significant for chronic HFpEF, CAD status post stenting on Brilinta, iron deficiency anemia, anxiety/depression, GERD, HTN, & HLD.   Clinical Impression   Pt up on BSC with PT upon OT arrival, 8/10 LBP with RN planning meds at 1000 per pt. PTA, pt lives alone in first floor apartment, independent with ADLs and mobility inside home but uses 4WW occasionally for community distances.  Requires CGA for functional transfers with RW, able to complete toileting tasks with light minA to pull underwear over hips. Required ~3 min recovery break seated in recliner before completing short bout of mobility in room with RW + CGA ~25 feet before fatiguing. Pt currently below functional baseline, presenting with generalized weakness, decreased tolerance to activity and need for continued EC education to maximize potential. Pt would benefit from skilled OT services to address noted impairments and functional limitations (see below for any additional details) in order to maximize safety and independence while minimizing falls risk and caregiver burden. Patient will benefit from continued inpatient follow up therapy, <3 hours/day.       If plan is discharge home, recommend the following: A little help with walking and/or transfers;Assistance with cooking/housework;Assist for transportation;Help with stairs or ramp for entrance;A lot of help with bathing/dressing/bathroom    Functional Status Assessment  Patient has had a recent decline in their functional status and demonstrates the ability to make significant improvements in function in a reasonable and  predictable amount of time.  Equipment Recommendations  None recommended by OT       Precautions / Restrictions Precautions Precautions: Fall Precaution Comments: watch O2 Restrictions Weight Bearing Restrictions Per Provider Order: No      Mobility Bed Mobility               General bed mobility comments: NT, pt on BSC with PT upon arrival and left in recliner    Transfers Overall transfer level: Needs assistance Equipment used: Rolling walker (2 wheels) Transfers: Sit to/from Stand Sit to Stand: Contact guard assist     Step pivot transfers: Contact guard assist            Balance Overall balance assessment: Needs assistance Sitting-balance support: Feet supported, No upper extremity supported Sitting balance-Leahy Scale: Good     Standing balance support: During functional activity Standing balance-Leahy Scale: Good                             ADL either performed or assessed with clinical judgement   ADL Overall ADL's : Needs assistance/impaired Eating/Feeding: Sitting;Modified independent   Grooming: Wash/dry hands;Wash/dry face;Sitting Grooming Details (indicate cue type and reason): recliner, fatigues quickly 2/2 SOB                 Toilet Transfer: Contact guard assist;BSC/3in1;Stand-pivot;Rolling walker (2 wheels) Toilet Transfer Details (indicate cue type and reason): Increased time/effort to perform transfer to [facility] placed near EOB. Toileting- Clothing Manipulation and Hygiene: Contact guard assist;Sitting/lateral lean;Sit to/from stand Toileting - Clothing Manipulation Details (indicate cue type and reason): Performs peri-care while seated with SET UP assist. Able to pull up underwear with minA  Functional mobility during ADLs: Contact guard assist;Cueing for safety;Rolling walker (2 wheels) General ADL Comments: Pt with poor carryover from education during last admit regarding energy conservation strategies. Able to  complete short bout of functional mobility in room with 2L O2 via [place] ~25 ft, OT facilitates rest breaks in standing as pt SOB upon exertion      Pertinent Vitals/Pain Pain Assessment Pain Assessment: 0-10 Pain Score: 8  Pain Location: chronic back pain Pain Descriptors / Indicators: Sore, Aching Pain Intervention(s): Limited activity within patient's tolerance, Monitored during session, Repositioned     Extremity/Trunk Assessment Upper Extremity Assessment Upper Extremity Assessment: Generalized weakness   Lower Extremity Assessment Lower Extremity Assessment: Generalized weakness   Cervical / Trunk Assessment Cervical / Trunk Assessment: Kyphotic Cervical / Trunk Exceptions: baseline R Lateral head Tilt while seated; Hx of Cervical Fx and scoliosis   Communication Communication Communication: No apparent difficulties   Cognition Arousal: Alert Behavior During Therapy: WFL for tasks assessed/performed Overall Cognitive Status: Within Functional Limits for tasks assessed                                       General Comments  On RA upon arrival, SpO2% at 90%> throughout, pt endorsing feeling SOB, 2L applied with sats 99-100%. Decreased down to 1L O2, sats maintaining 98%+ while seated in recliner. RN notified.    Exercises Other Exercises Other Exercises: Education on energy conservation strategies and PLB to decrease SOB and fatigue with mobility        Home Living Family/patient expects to be discharged to:: Private residence Living Arrangements: Alone Available Help at Discharge: Family;Available PRN/intermittently;Friend(s) Type of Home: Apartment Home Access: Ramped entrance     Home Layout: One level     Bathroom Shower/Tub: Chief Strategy Officer: Standard Bathroom Accessibility: Yes   Home Equipment: Shower seat;Toilet riser;Rollator (4 wheels)          Prior Functioning/Environment Prior Level of Function :  Independent/Modified Independent             Mobility Comments: Has a rollator she uses on occasion for community mobility. ADLs Comments: IND with ADLs, friends/family assist with driving. Otherwise independent for ADL management. Recently started [facility] OT/PT.        OT Problem List: Decreased strength;Pain;Cardiopulmonary status limiting activity;Decreased range of motion;Decreased activity tolerance;Impaired balance (sitting and/or standing);Decreased knowledge of use of DME or AE      OT Treatment/Interventions: Self-care/ADL training;Therapeutic exercise;Therapeutic activities;Energy conservation;DME and/or AE instruction;Patient/family education;Balance training    OT Goals(Current goals can be found in the care plan section) Acute Rehab OT Goals OT Goal Formulation: With patient Time For Goal Achievement: 07/18/23 Potential to Achieve Goals: Good  OT Frequency: Min 1X/week       AM-PAC OT "6 Clicks" Daily Activity     Outcome Measure Help from another person eating meals?: None Help from another person taking care of personal grooming?: A Little Help from another person toileting, which includes using toliet, bedpan, or urinal?: A Little Help from another person bathing (including washing, rinsing, drying)?: A Little Help from another person to put on and taking off regular upper body clothing?: A Little Help from another person to put on and taking off regular lower body clothing?: A Little 6 Click Score: 19   End of Session Equipment Utilized During Treatment: Oxygen;Rolling walker (2 wheels) Nurse Communication: Mobility status  Activity  Tolerance: Patient tolerated treatment well Patient left: in chair;with call bell/phone within reach;with chair alarm set  OT Visit Diagnosis: Other abnormalities of gait and mobility (R26.89);Muscle weakness (generalized) (M62.81);Pain Pain - part of body:  (back)                Time: 8119-1478 OT Time Calculation (min): 12  min Charges:  OT General Charges $OT Visit: 1 Visit OT Evaluation $OT Eval Low Complexity: 1 Low  Zasha Belleau L. Shereta Crothers, OTR/L  07/04/23, 1:30 PM

## 2023-07-05 DIAGNOSIS — K922 Gastrointestinal hemorrhage, unspecified: Secondary | ICD-10-CM | POA: Diagnosis not present

## 2023-07-05 DIAGNOSIS — D649 Anemia, unspecified: Secondary | ICD-10-CM | POA: Diagnosis not present

## 2023-07-05 LAB — CULTURE, RESPIRATORY W GRAM STAIN

## 2023-07-05 MED ORDER — PEG 3350-KCL-NA BICARB-NACL 420 G PO SOLR
4000.0000 mL | Freq: Once | ORAL | Status: AC
  Administered 2023-07-05: 4000 mL via ORAL
  Filled 2023-07-05: qty 4000

## 2023-07-05 NOTE — Progress Notes (Signed)
Progress Note   Patient: Terri Burns ZOX:[id number] DOB: 1951/03/23 DOA: 07/02/2023     3 DOS: the patient was seen and examined on 07/05/2023  Brief hospital course:   HPI on admission "Terri Burns is a 73 y.o. female with medical history significant of COPD, chronic HFpEF, CAD status post stenting on Brilinta, iron deficiency anemia, anxiety/depression, GERD, HTN, HLD, presented with worsening of cough wheezing shortness of breath and malaise. "   See H&P for full HPI on admission & ED course.   Pt was admitted for further evaluation and management of mild COPD exacerbation and symptomatic acute on chronic iron deficiency anemia requiring blood transfusion.  GI consulted.   Further hospital course and management as outlined below.     Assessment and Plan:   Acute hypoxic respiratory failure Multifactorial, the main driving force appears to be symptomatic anemia/highoutput HF, possibly concurrent mild COPD exacerbation. 1/28 - weaned from 8 L O2 yesterday >> 4 L today --No diuresis initiated on admission due to soft BP's --Monitor volume status closely --Mgmt of undelying issues as outlined --Wean O2 as tolerated, target sats < 88% --Diuresis as needed and as BP tolerates   Acute on chronic HFpEF decompensation -As above   COPD with mild acute exacerbation Persistent infiltrates on the chest x-ray, but pt recently completed full course of ceftriaxone and azithromycin 2 weeks ago to treat similar CAP course. --No antibiotics indicated, procal negative -- Continue ICS and LABA --DuoNebs and as needed albuterol --Incentive symmetry and flutter valve --Monitor closely and consider adding systemic steroid if progressing severity   Acute on chronic symptomatic anemia, iron deficiency GI bleed, acute on chronic likely upper GI Increase of BUN/creatinine implying upper GI bleed --GI is consulted --Endoscopic evaluation is deferred pending improved respiratory status Continue  PPI therapy GI on board and case discussed   CAD Continue aspirin, hold off Brilinta --Continue statin and hold off home BP meds   HTN Monitor blood pressure closely     Subjective:  Patient seen and examined at bedside this morning Denies nausea vomiting abdominal pain or chest pain Currently on 4 L of intranasal oxygen GI planning on EGD and colonoscopy tomorrow  Physical Exam:  General exam: awake, alert, no acute distress, chronically ill appearing HEENT: moist mucus membranes, hearing grossly normal  Respiratory system: improved aeration, no active wheezing, no rhonchi, normal respiratory effort at rest on 2 L/min [place] o2. Cardiovascular system: normal S1/S2, RRR, no pedal edema.   Gastrointestinal system: soft, NT, ND Central nervous system: A&O x 3. no gross focal neurologic deficits, normal speech Skin: dry, intact, normal temperature Psychiatry: normal mood, flat affect  Physical Exam: Vitals:   07/05/23 0830 07/05/23 1300 07/05/23 1421 07/05/23 1742  BP:  116/68  136/84  Pulse: 84 86 85 84  Resp: 16 15 16 16   Temp:  99.3 F (37.4 C)  98.7 F (37.1 C)  TempSrc:      SpO2: 99% 100% 99% 100%  Weight:      Height:        Data Reviewed:    Latest Ref Rng & Units 07/04/2023    5:34 AM 07/03/2023    5:31 AM 07/02/2023   10:10 AM  BMP  Glucose 70 - 99 mg/dL 86  811  914   BUN 8 - 23 mg/dL 16  30  52   Creatinine 0.44 - 1.00 mg/dL 7.82  9.56  2.13   Sodium 135 - 145 mmol/L 139  143  137   Potassium 3.5 - 5.1 mmol/L 4.2  4.0  4.8   Chloride 98 - 111 mmol/L 110  112  108   CO2 22 - 32 mmol/L 25  24  21    Calcium 8.9 - 10.3 mg/dL 7.9  8.2  8.5        Latest Ref Rng & Units 07/04/2023   10:40 PM 07/04/2023    5:53 PM 07/04/2023   10:50 AM  CBC  Hemoglobin 12.0 - 15.0 g/dL 7.2  7.2  7.4   Hematocrit 36.0 - 46.0 % 22.2  22.0  22.9       Author: Loyce Dys, MD 07/05/2023 6:53 PM  For on call review [email or web address].

## 2023-07-05 NOTE — Plan of Care (Signed)

## 2023-07-05 NOTE — Care Management Important Message (Signed)
Important Message  Patient Details  Name: Terri Burns MRN: [id number] Date of Birth: April 07, 1951   Important Message Given:  Yes - Medicare IM     Cristela Blue, CMA 07/05/2023, 10:28 AM

## 2023-07-05 NOTE — TOC Initial Note (Signed)
Transition of Care [facility]) - Initial/Assessment Note    Patient Details  Name: Terri Burns MRN: [id number] Date of Birth: 1950-09-26  Transition of Care [facility]) CM/SW Contact:    Truddie Hidden, RN Phone Number: 07/05/2023, 9:13 AM  Clinical Narrative:                 Spoke with patient regarding discharge plan and therapy's recommendation for STR. Patient is not interested in going to SNFat this time. She feels she will be strong enough to go home at discharge. Her sons and grandson are able to assist at home. Patient was active with [facility] PT/OT prior to admission. She would like to see how well she does with therapy again before making a final decision about going to SNF.        Patient Goals and CMS Choice            Expected Discharge Plan and Services                                              Prior Living Arrangements/Services                       Activities of Daily Living   ADL Screening (condition at time of admission) Independently performs ADLs?: Yes (appropriate for developmental age) Does the patient have a NEW difficulty with bathing/dressing/toileting/self-feeding that is expected to last >3 days?: No Does the patient have a NEW difficulty with getting in/out of bed, walking, or climbing stairs that is expected to last >3 days?: No Does the patient have a NEW difficulty with communication that is expected to last >3 days?: No Is the patient deaf or have difficulty hearing?: No Does the patient have difficulty seeing, even when wearing glasses/contacts?: No Does the patient have difficulty concentrating, remembering, or making decisions?: No  Permission Sought/Granted                  Emotional Assessment              Admission diagnosis:  GI bleed [K92.2] Patient Active Problem List   Diagnosis Date Noted   Lower GI bleed 07/04/2023   Acute on chronic diastolic CHF (congestive heart failure) (HCC) 07/02/2023    GI bleed 07/02/2023   Right lower lobe pneumonia 06/09/2023   Acute respiratory failure with hypoxia (HCC) 06/09/2023   Coronary artery disease 06/09/2023   Pulmonary nodule 06/09/2023   Acute hypoxic respiratory failure (HCC) 06/09/2023   Community acquired pneumonia of right lower lobe of lung 06/09/2023   CAP (community acquired pneumonia) 06/09/2023   COPD exacerbation (HCC) 06/08/2023   Low vitamin B12 level 05/20/2023   Microcytic anemia 05/20/2023   Iron deficiency anemia 05/19/2023   Acute exacerbation of bronchiectasis (HCC) 05/17/2023   Pneumonia of right upper lobe due to Streptococcus pneumoniae (HCC) 05/17/2023   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) 01/05/2022   Anxiety and depression 01/05/2022   Dyslipidemia 01/05/2022   Coronary artery disease involving native coronary artery of native heart without angina pectoris 01/05/2022   GERD without esophagitis 01/05/2022   Acute hypoxemic respiratory failure (HCC) 01/05/2022   Centrilobular emphysema (HCC) 07/13/2021   Nicotine dependence, cigarettes, uncomplicated 04/13/2021   History of non-ST elevation myocardial infarction (NSTEMI)    Depression 09/13/2020   HLD (hyperlipidemia) 09/13/2020  Atherosclerosis of aorta (HCC) 03/04/2018   Coronary artery calcification of native artery 03/04/2018   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 [place] [address] Phone: [phone] Fax: [phone]     Social Drivers of Health (SDOH) Social History: SDOH  Screenings   Food Insecurity: No Food Insecurity (07/03/2023)  Housing: Low Risk  (07/04/2023)  Transportation Needs: No Transportation Needs (07/04/2023)  Utilities: Not At Risk (07/03/2023)  Alcohol Screen: Low Risk  (07/13/2021)  Depression (PHQ2-9): Low Risk  (05/11/2022)  Financial Resource Strain: Medium Risk (07/04/2023)  Physical Activity: Insufficiently Active (07/13/2021)  Social Connections: Socially Isolated (07/03/2023)  Stress: No Stress Concern Present (07/13/2021)  Tobacco Use: High Risk (07/04/2023)   SDOH Interventions: Housing Interventions: Intervention Not Indicated Transportation Interventions: Intervention Not Indicated Financial Strain Interventions: Intervention Not Indicated   Readmission Risk Interventions     No data to display

## 2023-07-05 NOTE — Progress Notes (Signed)
Terri Minium, MD [facility]   [address], [address] [place], [place] [address] Phone: [phone] Fax : [phone]   Subjective: This patient was seen this morning and states that she is feeling much better.  The patient is not having any signs of further GI bleeding.  She had deferred doing the procedure today because she wanted to rest up yesterday.  She is willing to proceed with the luminal evaluation now.  The patient had a stable hemoglobin yesterday but no labs are back from today.   Objective: Vital signs in last 24 hours: Vitals:   07/04/23 2000 07/04/23 2334 07/05/23 0425 07/05/23 0457  BP:  112/66 109/67   Pulse:  89 84   Resp:  18 20   Temp:  98.1 F (36.7 C) 98.2 F (36.8 C)   TempSrc:  Oral Oral   SpO2: 100% 98% 100%   Weight:    53.5 kg  Height:       Weight change:   Intake/Output Summary (Last 24 hours) at 07/05/2023 0740 Last data filed at 07/04/2023 1645 Gross per 24 hour  Intake 1460 ml  Output 500 ml  Net 960 ml     Exam: Heart:: Regular rate and rhythm or without murmur or extra heart sounds Lungs: normal and clear to auscultation and percussion Abdomen: soft, nontender, normal bowel sounds   Lab Results: @LABTEST2 @ Micro Results: Recent Results (from the past 240 hours)  Resp panel by RT-PCR (RSV, Flu A&B, Covid) Anterior Nasal Swab     Status: None   Collection Time: 07/02/23 10:17 AM   Specimen: Anterior Nasal Swab  Result Value Ref Range Status   SARS Coronavirus 2 by RT PCR NEGATIVE NEGATIVE Final    Comment: (NOTE) SARS-CoV-2 target nucleic acids are NOT DETECTED.  The SARS-CoV-2 RNA is generally detectable in upper respiratory specimens during the acute phase of infection. The lowest concentration of SARS-CoV-2 viral copies this assay can detect is 138 copies/mL. A negative result does not preclude SARS-Cov-2 infection and should not be used as the sole basis for treatment or other patient management decisions. A negative result  may occur with  improper specimen collection/handling, submission of specimen other than nasopharyngeal swab, presence of viral mutation(s) within the areas targeted by this assay, and inadequate number of viral copies(<138 copies/mL). A negative result must be combined with clinical observations, patient history, and epidemiological information. The expected result is Negative.  Fact Sheet for Patients:  BloggerCourse.com  Fact Sheet for Healthcare Providers:  SeriousBroker.it  This test is no t yet approved or cleared by the [place] FDA and  has been authorized for detection and/or diagnosis of SARS-CoV-2 by FDA under an Emergency Use Authorization (EUA). This EUA will remain  in effect (meaning this test can be used) for the duration of the COVID-19 declaration under Section 564(b)(1) of the Act, 21 U.S.C.section 360bbb-3(b)(1), unless the authorization is terminated  or revoked sooner.       Influenza A by PCR NEGATIVE NEGATIVE Final   Influenza B by PCR NEGATIVE NEGATIVE Final    Comment: (NOTE) The Xpert Xpress SARS-CoV-2/FLU/RSV plus assay is intended as an aid in the diagnosis of influenza from Nasopharyngeal swab specimens and should not be used as a sole basis for treatment. Nasal washings and aspirates are unacceptable for Xpert Xpress SARS-CoV-2/FLU/RSV testing.  Fact Sheet for Patients: BloggerCourse.com  Fact Sheet for Healthcare Providers: SeriousBroker.it  This test is not yet approved or cleared by the [place] FDA  and has been authorized for detection and/or diagnosis of SARS-CoV-2 by FDA under an Emergency Use Authorization (EUA). This EUA will remain in effect (meaning this test can be used) for the duration of the COVID-19 declaration under Section 564(b)(1) of the Act, 21 U.S.C. section 360bbb-3(b)(1), unless the authorization is terminated  or revoked.     Resp Syncytial Virus by PCR NEGATIVE NEGATIVE Final    Comment: (NOTE) Fact Sheet for Patients: BloggerCourse.com  Fact Sheet for Healthcare Providers: SeriousBroker.it  This test is not yet approved or cleared by the [place] FDA and has been authorized for detection and/or diagnosis of SARS-CoV-2 by FDA under an Emergency Use Authorization (EUA). This EUA will remain in effect (meaning this test can be used) for the duration of the COVID-19 declaration under Section 564(b)(1) of the Act, 21 U.S.C. section 360bbb-3(b)(1), unless the authorization is terminated or revoked.  Performed at [facility], [address] [place], [place] [address]   Expectorated Sputum Assessment w Gram Stain, Rflx to Resp Cult     Status: None   Collection Time: 07/02/23 11:55 PM   Specimen: Sputum  Result Value Ref Range Status   Specimen Description SPUTUM  Final   Special Requests NONE  Final   Sputum evaluation   Final    THIS SPECIMEN IS ACCEPTABLE FOR SPUTUM CULTURE Performed at [facility], [address], [place], [place] [address]    Report Status 07/03/2023 FINAL  Final  Culture, Respiratory w Gram Stain     Status: None (Preliminary result)   Collection Time: 07/02/23 11:55 PM   Specimen: SPU  Result Value Ref Range Status   Specimen Description   Final    SPUTUM Performed at [facility], [address] [place], [place] [address]    Special Requests   Final    NONE Reflexed from [id number] Performed at [facility], [address] [place], [place] [address]    Gram Stain   Final    RARE WBC PRESENT, PREDOMINANTLY PMN FEW GRAM POSITIVE COCCI    Culture   Final    ABUNDANT PSEUDOMONAS AERUGINOSA SUSCEPTIBILITIES TO FOLLOW Performed at [facility], [address] [place], [place] [address]    Report Status PENDING  Incomplete   Studies/Results: No results  found. Medications: I have reviewed the patient's current medications. Scheduled Meds:  aspirin EC  81 mg Oral Daily   atorvastatin  80 mg Oral Daily   citalopram  10 mg Oral QHS   ferrous sulfate  325 mg Oral QAC breakfast   fluticasone furoate-vilanterol  1 puff Inhalation Daily   And   umeclidinium bromide  1 puff Inhalation Daily   guaiFENesin  5 mL Oral Q6H   ipratropium-albuterol  3 mL Nebulization TID   loratadine  10 mg Oral Daily   pantoprazole (PROTONIX) IV  40 mg Intravenous Q12H   sodium chloride flush  3 mL Intravenous Q12H   Continuous Infusions:  sodium chloride     PRN Meds:.acetaminophen, albuterol, fluticasone, hydrALAZINE, HYDROcodone-acetaminophen, ondansetron (ZOFRAN) IV, sodium chloride flush   Assessment: Principal Problem:   GI bleed Active Problems:   Coronary artery calcification of native artery   Acute hypoxemic respiratory failure (HCC)   Acute on chronic diastolic CHF (congestive heart failure) (HCC)   Lower GI bleed    Plan: This patient came in with COPD exacerbation with shortness of breath and a low pulse oximetry who has improved greatly from a respiratory standpoint but  was admitted with a significantly low hemoglobin down to 5.8.  The patient is stable at about 7.2.  There is no further sign of bleeding.  The patient will be set up for an EGD and colonoscopy for tomorrow.   LOS: 3 days   Terri Minium, MD.FACG 07/05/2023, 7:40 AM Pager [phone] 7am-5pm  Check AMION for 5pm -7am coverage and on weekends

## 2023-07-06 DIAGNOSIS — D649 Anemia, unspecified: Secondary | ICD-10-CM | POA: Diagnosis not present

## 2023-07-06 DIAGNOSIS — K922 Gastrointestinal hemorrhage, unspecified: Secondary | ICD-10-CM | POA: Diagnosis not present

## 2023-07-06 LAB — TYPE AND SCREEN
ABO/RH(D): A POS
Antibody Screen: NEGATIVE
Unit division: 0
Unit division: 0
Unit division: 0

## 2023-07-06 LAB — BPAM RBC
Blood Product Expiration Date: 202502182359
Blood Product Expiration Date: 202502232359
Blood Product Expiration Date: 202502182359
ISSUE DATE / TIME: 202501270739
ISSUE DATE / TIME: 202501261516
Unit Type and Rh: 6200
Unit Type and Rh: 6200
Unit Type and Rh: 6200

## 2023-07-06 LAB — CBC WITH DIFFERENTIAL/PLATELET
Abs Immature Granulocytes: 0.03 10*3/uL (ref 0.00–0.07)
Basophils Absolute: 0 10*3/uL (ref 0.0–0.1)
Basophils Relative: 0 %
Eosinophils Absolute: 0.2 10*3/uL (ref 0.0–0.5)
Eosinophils Relative: 6 %
HCT: 21.5 % — ABNORMAL LOW (ref 36.0–46.0)
Hemoglobin: 6.9 g/dL — ABNORMAL LOW (ref 12.0–15.0)
Immature Granulocytes: 1 %
Lymphocytes Relative: 19 %
Lymphs Abs: 0.8 10*3/uL (ref 0.7–4.0)
MCH: 29.2 pg (ref 26.0–34.0)
MCHC: 32.1 g/dL (ref 30.0–36.0)
MCV: 91.1 fL (ref 80.0–100.0)
Monocytes Absolute: 0.5 10*3/uL (ref 0.1–1.0)
Monocytes Relative: 12 %
Neutro Abs: 2.6 10*3/uL (ref 1.7–7.7)
Neutrophils Relative %: 62 %
Platelets: 113 10*3/uL — ABNORMAL LOW (ref 150–400)
RBC: 2.36 MIL/uL — ABNORMAL LOW (ref 3.87–5.11)
RDW: 20.7 % — ABNORMAL HIGH (ref 11.5–15.5)
WBC: 4.2 10*3/uL (ref 4.0–10.5)
nRBC: 0 % (ref 0.0–0.2)

## 2023-07-06 LAB — BASIC METABOLIC PANEL
Anion gap: 7 (ref 5–15)
BUN: <5 mg/dL — ABNORMAL LOW (ref 8–23)
CO2: 24 mmol/L (ref 22–32)
Calcium: 7.6 mg/dL — ABNORMAL LOW (ref 8.9–10.3)
Chloride: 108 mmol/L (ref 98–111)
Creatinine, Ser: 0.49 mg/dL (ref 0.44–1.00)
GFR, Estimated: >60 mL/min (ref >60)
Glucose, Bld: 77 mg/dL (ref 70–99)
Potassium: 4 mmol/L (ref 3.5–5.1)
Sodium: 139 mmol/L (ref 135–145)

## 2023-07-06 LAB — PREPARE RBC (CROSSMATCH)

## 2023-07-06 MED ORDER — SODIUM CHLORIDE 0.9% IV SOLUTION
Freq: Once | INTRAVENOUS | Status: AC
Start: 2023-07-06 — End: 2023-07-06

## 2023-07-06 NOTE — Progress Notes (Signed)
Progress Note   Patient: Terri Burns WUJ:[id number] DOB: 04/06/1951 DOA: 07/02/2023     4 DOS: the patient was seen and examined on 07/06/2023     Brief hospital course:   HPI on admission "RAYMOND AZURE is a 73 y.o. female with medical history significant of COPD, chronic HFpEF, CAD status post stenting on Brilinta, iron deficiency anemia, anxiety/depression, GERD, HTN, HLD, presented with worsening of cough wheezing shortness of breath and malaise. "   See H&P for full HPI on admission & ED course.   Pt was admitted for further evaluation and management of mild COPD exacerbation and symptomatic acute on chronic iron deficiency anemia requiring blood transfusion.  GI consulted.   Further hospital course and management as outlined below.     Assessment and Plan:   Acute hypoxic respiratory failure Multifactorial, the main driving force appears to be symptomatic anemia/highoutput HF, possibly concurrent mild COPD exacerbation. 1/28 - weaned from 8 L O2 yesterday >> 4 L today --No diuresis initiated on admission due to soft BP's --Monitor volume status closely --Mgmt of undelying issues as outlined --Wean O2 as tolerated, target sats < 88% --Diuresis as needed and as BP tolerates   Acute on chronic HFpEF decompensation -As above   COPD with mild acute exacerbation Persistent infiltrates on the chest x-ray, but pt recently completed full course of ceftriaxone and azithromycin 2 weeks ago to treat similar CAP course. --No antibiotics indicated, procal negative Continue ICS and LABA --DuoNebs and as needed albuterol --Incentive symmetry and flutter valve --Monitor closely and consider adding systemic steroid if progressing severity   Acute on chronic symptomatic anemia, iron deficiency GI bleed, acute on chronic likely upper GI Increase of BUN/creatinine implying upper GI bleed --GI is consulted --Endoscopic evaluation is deferred pending improved respiratory  status Continue PPI therapy Strength neurologist on board and case discussed To transfuse 1 unit of packed RBC today   CAD Continue aspirin, hold off Brilinta --Continue statin and hold off home BP meds   HTN Monitor blood pressure closely     Subjective:  Patient seen and examined at bedside this morning Patient being planned for EGD and colonoscopy today Denied any bleeding   Physical Exam:   General exam: awake, alert, no acute distress, chronically ill appearing HEENT: moist mucus membranes, hearing grossly normal  Respiratory system: improved aeration, no active wheezing, no rhonchi, normal respiratory effort at rest on 2 L/min [place] o2. Cardiovascular system: normal S1/S2, RRR, no pedal edema.   Gastrointestinal system: soft, NT, ND Central nervous system: A&O x 3. no gross focal neurologic deficits, normal speech Skin: dry, intact, normal temperature Psychiatry: normal mood, flat affect     Data Reviewed:     Latest Ref Rng & Units 07/06/2023    5:23 AM 07/04/2023   10:40 PM 07/04/2023    5:53 PM  CBC  WBC 4.0 - 10.5 K/uL 4.2     Hemoglobin 12.0 - 15.0 g/dL 6.9  7.2  7.2   Hematocrit 36.0 - 46.0 % 21.5  22.2  22.0   Platelets 150 - 400 K/uL 113          Latest Ref Rng & Units 07/06/2023    5:23 AM 07/04/2023    5:34 AM 07/03/2023    5:31 AM  BMP  Glucose 70 - 99 mg/dL 77  86  956   BUN 8 - 23 mg/dL 5  16  30    Creatinine 0.44 - 1.00 mg/dL 2.13  0.86  0.42   Sodium 135 - 145 mmol/L 139  139  143   Potassium 3.5 - 5.1 mmol/L 4.0  4.2  4.0   Chloride 98 - 111 mmol/L 108  110  112   CO2 22 - 32 mmol/L 24  25  24    Calcium 8.9 - 10.3 mg/dL 7.6  7.9  8.2      Vitals:   07/06/23 0814 07/06/23 0828 07/06/23 1126 07/06/23 1404  BP: 129/80  (!) 123/91   Pulse: 92 94 92 84  Resp: 18 20 18 16   Temp: 98.7 F (37.1 C)  99.7 F (37.6 C)   TempSrc: Oral  Oral   SpO2: 99% 99% 100% 99%  Weight:      Height:         Author: Loyce Dys, MD 07/06/2023 6:25  PM  For on call review [email or web address].

## 2023-07-06 NOTE — Progress Notes (Signed)
PT Cancellation Note  Patient Details Name: Terri Burns MRN: [id number] DOB: 1951-05-03   Cancelled Treatment:     Pt resting bed, attempting to prep for Colonoscopy Procedure today. Hgb 6.9 this am. Will re-attempt next available date/time per POC.    Jannet Askew 07/06/2023, 2:10 PM

## 2023-07-07 ENCOUNTER — Inpatient Hospital Stay: Payer: 59 | Admitting: Certified Registered"

## 2023-07-07 ENCOUNTER — Encounter
Admission: EM | Disposition: A | Discharge status: Home Health | Payer: Self-pay | Source: Home / Self Care | Attending: Internal Medicine

## 2023-07-07 ENCOUNTER — Encounter: Payer: Self-pay | Admitting: Internal Medicine

## 2023-07-07 DIAGNOSIS — K269 Duodenal ulcer, unspecified as acute or chronic, without hemorrhage or perforation: Secondary | ICD-10-CM | POA: Diagnosis not present

## 2023-07-07 DIAGNOSIS — K2970 Gastritis, unspecified, without bleeding: Secondary | ICD-10-CM | POA: Diagnosis not present

## 2023-07-07 DIAGNOSIS — K635 Polyp of colon: Secondary | ICD-10-CM

## 2023-07-07 DIAGNOSIS — K922 Gastrointestinal hemorrhage, unspecified: Secondary | ICD-10-CM | POA: Diagnosis not present

## 2023-07-07 DIAGNOSIS — D122 Benign neoplasm of ascending colon: Secondary | ICD-10-CM

## 2023-07-07 DIAGNOSIS — K5730 Diverticulosis of large intestine without perforation or abscess without bleeding: Secondary | ICD-10-CM | POA: Diagnosis not present

## 2023-07-07 DIAGNOSIS — K640 First degree hemorrhoids: Secondary | ICD-10-CM

## 2023-07-07 DIAGNOSIS — D649 Anemia, unspecified: Secondary | ICD-10-CM | POA: Diagnosis not present

## 2023-07-07 HISTORY — PX: ESOPHAGOGASTRODUODENOSCOPY (EGD) WITH PROPOFOL: SHX5813

## 2023-07-07 HISTORY — PX: BIOPSY: SHX5522

## 2023-07-07 HISTORY — PX: POLYPECTOMY: SHX5525

## 2023-07-07 HISTORY — PX: COLONOSCOPY WITH PROPOFOL: SHX5780

## 2023-07-07 LAB — CBC WITH DIFFERENTIAL/PLATELET
Abs Immature Granulocytes: 0.02 10*3/uL (ref 0.00–0.07)
Basophils Absolute: 0 10*3/uL (ref 0.0–0.1)
Basophils Relative: 0 %
Eosinophils Absolute: 0.3 10*3/uL (ref 0.0–0.5)
Eosinophils Relative: 6 %
HCT: 26.7 % — ABNORMAL LOW (ref 36.0–46.0)
Hemoglobin: 8.7 g/dL — ABNORMAL LOW (ref 12.0–15.0)
Immature Granulocytes: 0 %
Lymphocytes Relative: 18 %
Lymphs Abs: 0.9 10*3/uL (ref 0.7–4.0)
MCH: 29.4 pg (ref 26.0–34.0)
MCHC: 32.6 g/dL (ref 30.0–36.0)
MCV: 90.2 fL (ref 80.0–100.0)
Monocytes Absolute: 0.5 10*3/uL (ref 0.1–1.0)
Monocytes Relative: 11 %
Neutro Abs: 3.1 10*3/uL (ref 1.7–7.7)
Neutrophils Relative %: 65 %
Platelets: 121 10*3/uL — ABNORMAL LOW (ref 150–400)
RBC: 2.96 MIL/uL — ABNORMAL LOW (ref 3.87–5.11)
RDW: 18.7 % — ABNORMAL HIGH (ref 11.5–15.5)
WBC: 4.7 10*3/uL (ref 4.0–10.5)
nRBC: 0 % (ref 0.0–0.2)

## 2023-07-07 LAB — TYPE AND SCREEN
ABO/RH(D): A POS
Antibody Screen: NEGATIVE
Unit division: 0

## 2023-07-07 LAB — BASIC METABOLIC PANEL
Anion gap: 7 (ref 5–15)
BUN: <5 mg/dL — ABNORMAL LOW (ref 8–23)
CO2: 27 mmol/L (ref 22–32)
Calcium: 8 mg/dL — ABNORMAL LOW (ref 8.9–10.3)
Chloride: 105 mmol/L (ref 98–111)
Creatinine, Ser: 0.47 mg/dL (ref 0.44–1.00)
GFR, Estimated: >60 mL/min (ref >60)
Glucose, Bld: 79 mg/dL (ref 70–99)
Potassium: 4 mmol/L (ref 3.5–5.1)
Sodium: 139 mmol/L (ref 135–145)

## 2023-07-07 LAB — BPAM RBC
Blood Product Expiration Date: 202503032359
ISSUE DATE / TIME: 202501302137
Unit Type and Rh: 6200

## 2023-07-07 LAB — GLUCOSE, CAPILLARY: Glucose-Capillary: 88 mg/dL (ref 70–99)

## 2023-07-07 SURGERY — COLONOSCOPY WITH PROPOFOL
Anesthesia: General

## 2023-07-07 MED ORDER — LIDOCAINE 2% (20 MG/ML) 5 ML SYRINGE
INTRAMUSCULAR | Status: DC | PRN
  Administered 2023-07-07: 100 mg via INTRAVENOUS

## 2023-07-07 MED ORDER — PROPOFOL 10 MG/ML IV BOLUS
INTRAVENOUS | Status: DC | PRN
  Administered 2023-07-07: 100 ug/kg/min via INTRAVENOUS
  Administered 2023-07-07: 100 mg via INTRAVENOUS

## 2023-07-07 MED ORDER — PROPOFOL 10 MG/ML IV BOLUS
INTRAVENOUS | Status: AC
  Filled 2023-07-07: qty 40

## 2023-07-07 MED ORDER — FENTANYL CITRATE (PF) 100 MCG/2ML IJ SOLN
INTRAMUSCULAR | Status: AC
  Filled 2023-07-07: qty 2

## 2023-07-07 MED ORDER — LIDOCAINE HCL (PF) 2 % IJ SOLN
INTRAMUSCULAR | Status: AC
  Filled 2023-07-07: qty 5

## 2023-07-07 MED ORDER — SODIUM CHLORIDE 0.9 % IV SOLN: INTRAVENOUS | Status: DC

## 2023-07-07 MED ORDER — FENTANYL CITRATE (PF) 100 MCG/2ML IJ SOLN
INTRAMUSCULAR | Status: DC | PRN
  Administered 2023-07-07: 25 ug via INTRAVENOUS

## 2023-07-07 NOTE — Plan of Care (Signed)

## 2023-07-07 NOTE — Op Note (Signed)
[facility] Gastroenterology Patient Name: Terri Burns Procedure Date: 07/07/2023 10:26 AM MRN: [id number] Account #: [id number] Date of Birth: 16-Oct-1950 Admit Type: Inpatient Age: 73 Room: [facility] ENDO ROOM 4 Gender: Female Note Status: Finalized Instrument Name: Peds Colonoscope [id number] Procedure:             Colonoscopy Indications:           Iron deficiency anemia secondary to chronic blood loss Providers:             Midge Minium MD, MD Medicines:             Propofol per Anesthesia Complications:         No immediate complications. Procedure:             Pre-Anesthesia Assessment:                        - Prior to the procedure, a History and Physical was                         performed, and patient medications and allergies were                         reviewed. The patient's tolerance of previous                         anesthesia was also reviewed. The risks and benefits                         of the procedure and the sedation options and risks                         were discussed with the patient. All questions were                         answered, and informed consent was obtained. Prior                         Anticoagulants: The patient has taken Brilinta                         (ticagrelor), last dose was 4 days prior to procedure.                         ASA Grade Assessment: III - A patient with severe                         systemic disease. After reviewing the risks and                         benefits, the patient was deemed in satisfactory                         condition to undergo the procedure.                        After obtaining informed consent, the colonoscope was                         passed  under direct vision. Throughout the procedure,                         the patient's blood pressure, pulse, and oxygen                         saturations were monitored continuously. The                         Colonoscope was introduced  through the anus and                         advanced to the the cecum, identified by appendiceal                         orifice and ileocecal valve. The colonoscopy was                         performed without difficulty. The patient tolerated                         the procedure well. The quality of the bowel                         preparation was good. Findings:      The perianal and digital rectal examinations were normal.      A 4 mm polyp was found in the ascending colon. The polyp was sessile.       The polyp was removed with a cold snare. Resection and retrieval were       complete.      Multiple small-mouthed diverticula were found in the sigmoid colon and       descending colon.      Non-bleeding internal hemorrhoids were found during retroflexion. The       hemorrhoids were Grade I (internal hemorrhoids that do not prolapse). Impression:            - One 4 mm polyp in the ascending colon, removed with                         a cold snare. Resected and retrieved.                        - Diverticulosis in the sigmoid colon and in the                         descending colon.                        - Non-bleeding internal hemorrhoids. Recommendation:        - Return patient to hospital ward for ongoing care.                        - Resume previous diet.                        - Continue present medications.                        - Await pathology results. Procedure Code(s):     ---  Professional ---                        [phone], Colonoscopy, flexible; with removal of                         tumor(s), polyp(s), or other lesion(s) by snare                         technique Diagnosis Code(s):     --- Professional ---                        D50.0, Iron deficiency anemia secondary to blood loss                         (chronic)                        D12.2, Benign neoplasm of ascending colon CPT copyright 2022 American Medical Association. All rights reserved. The codes  documented in this report are preliminary and upon coder review may  be revised to meet current compliance requirements. Midge Minium MD, MD 07/07/2023 10:58:33 AM This report has been signed electronically. Number of Addenda: 0 Note Initiated On: 07/07/2023 10:26 AM Scope Withdrawal Time: 0 hours 6 minutes 57 seconds  Total Procedure Duration: 0 hours 10 minutes 34 seconds  Estimated Blood Loss:  Estimated blood loss: none.      [facility]

## 2023-07-07 NOTE — Addendum Note (Signed)
Addendum  created 07/07/23 1135 by Darrell Jewel I, CRNA   Attestation recorded in [place], Clinical Note Signed, Flowsheet accepted, Intraprocedure Attestations filed, Intraprocedure Event edited, Intraprocedure Flowsheets edited, Intraprocedure Meds edited, Intraprocedure Staff edited, Patient device added, Patient device removed

## 2023-07-07 NOTE — Progress Notes (Signed)
Occupational Therapy Treatment Patient Details Name: Terri Burns MRN: [id number] DOB: 09/11/1950 Today's Date: 07/07/2023   History of present illness Pt is a 73 y.o. female presenting to [facility] ED with worsening of cough, wheezing, SOB and malaise. Found to have GI bleed, CHF, acute hypoxic respiratory failure, COPD exacerbation and chronic symptomatic anemia. PMH significant for chronic HFpEF, CAD status post stenting on Brilinta, iron deficiency anemia, anxiety/depression, GERD, HTN, & HLD.   OT comments  Chart reviewed to date, pt recently back from procedure and requesting to get oob to chair to eat. Improvements noted throughout with pt performing supine>sit with supervision, STS with CGA-MIN A, short amb transfer to bedside chair with CGA-MIN. SET UP for feeding tasks. Pt is making progress towards goals, discharge remains appropriate.       If plan is discharge home, recommend the following:  A little help with walking and/or transfers;Assistance with cooking/housework;Assist for transportation;Help with stairs or ramp for entrance;A lot of help with bathing/dressing/bathroom   Equipment Recommendations  None recommended by OT    Recommendations for Other Services      Precautions / Restrictions Precautions Precautions: Fall Precaution Comments: watch O2 Restrictions Weight Bearing Restrictions Per Provider Order: No       Mobility Bed Mobility Overal bed mobility: Needs Assistance Bed Mobility: Supine to Sit     Supine to sit: Supervision, Used rails, HOB elevated          Transfers Overall transfer level: Needs assistance Equipment used: Rolling walker (2 wheels) Transfers: Sit to/from Stand Sit to Stand: Contact guard assist                 Balance Overall balance assessment: Needs assistance Sitting-balance support: Feet supported, No upper extremity supported       Standing balance support: During functional activity Standing balance-Leahy  Scale: Good                             ADL either performed or assessed with clinical judgement   ADL Overall ADL's : Needs assistance/impaired Eating/Feeding: Sitting;Set up                       Toilet Transfer: Contact guard assist;Rolling walker (2 wheels);Minimal assistance Toilet Transfer Details (indicate cue type and reason): simualted to bedside chair           General ADL Comments: pt recently back from colonoscopy    Extremity/Trunk Assessment              Vision       Perception     Praxis      Cognition Arousal: Alert Behavior During Therapy: WFL for tasks assessed/performed Overall Cognitive Status: Within Functional Limits for tasks assessed                                          Exercises Other Exercises Other Exercises: edu re: role of OT, role of rehab, discharge recommendations; discharged ecs techniques    Shoulder Instructions       General Comments spo2 >90% on 3L via [place] throughout, HR 92 bpm post mobility    Pertinent Vitals/ Pain       Pain Assessment Pain Assessment: No/denies pain  Home Living  Prior Functioning/Environment              Frequency  Min 1X/week        Progress Toward Goals  OT Goals(current goals can now be found in the care plan section)  Progress towards OT goals: Progressing toward goals  Acute Rehab OT Goals Time For Goal Achievement: 07/18/23  Plan      Co-evaluation                 AM-PAC OT "6 Clicks" Daily Activity     Outcome Measure   Help from another person eating meals?: None Help from another person taking care of personal grooming?: A Little Help from another person toileting, which includes using toliet, bedpan, or urinal?: A Little Help from another person bathing (including washing, rinsing, drying)?: A Little Help from another person to put on and taking off regular  upper body clothing?: A Little Help from another person to put on and taking off regular lower body clothing?: A Little 6 Click Score: 19    End of Session Equipment Utilized During Treatment: Oxygen;Rolling walker (2 wheels)  OT Visit Diagnosis: Other abnormalities of gait and mobility (R26.89);Muscle weakness (generalized) (M62.81);Pain   Activity Tolerance Patient tolerated treatment well   Patient Left in chair;with call bell/phone within reach;with chair alarm set   Nurse Communication Mobility status        Time: 1610-9604 OT Time Calculation (min): 11 min  Charges: OT General Charges $OT Visit: 1 Visit OT Treatments $Therapeutic Activity: 8-22 mins  Oleta Mouse, OTD OTR/L  07/07/23, 1:59 PM

## 2023-07-07 NOTE — Addendum Note (Signed)
Addendum  created 07/07/23 1101 by Darleene Cleaver, Gerrit Heck, MD   Clinical Note Signed, Review and Sign - Ready for Procedure, Review and Sign - Signed

## 2023-07-07 NOTE — Progress Notes (Addendum)
PT Cancellation Note  Patient Details Name: PHYLLICIA DUDEK MRN: [id number] DOB: 01/05/1951   Cancelled Treatment:   1344:  PT attempt. Pt currently eating lunch requesting Thereasa Parkin return in ~ 1 hour.  1630: Author return for PT session however pt endorsing severe fatigue and only willing to be assisted back to bed. Acute PT will continue to follow and progress as able per current POC.    Rushie Chestnut 07/07/2023, 1:58 PM

## 2023-07-07 NOTE — Op Note (Signed)
[facility] Gastroenterology Patient Name: Terri Burns Procedure Date: 07/07/2023 10:26 AM MRN: [id number] Account #: [id number] Date of Birth: 06/17/1950 Admit Type: Inpatient Age: 73 Room: [facility] ENDO ROOM 4 Gender: Female Note Status: Finalized Instrument Name: Patton Salles Endoscope [id number] Procedure:             Upper GI endoscopy Indications:           Iron deficiency anemia secondary to chronic blood loss Providers:             Midge Minium MD, MD Medicines:             Propofol per Anesthesia Complications:         No immediate complications. Procedure:             Pre-Anesthesia Assessment:                        - Prior to the procedure, a History and Physical was                         performed, and patient medications and allergies were                         reviewed. The patient's tolerance of previous                         anesthesia was also reviewed. The risks and benefits                         of the procedure and the sedation options and risks                         were discussed with the patient. All questions were                         answered, and informed consent was obtained. Prior                         Anticoagulants: The patient has taken no anticoagulant                         or antiplatelet agents. ASA Grade Assessment: III - A                         patient with severe systemic disease. After reviewing                         the risks and benefits, the patient was deemed in                         satisfactory condition to undergo the procedure.                        After obtaining informed consent, the endoscope was                         passed under direct vision. Throughout the procedure,  the patient's blood pressure, pulse, and oxygen                         saturations were monitored continuously. The Endoscope                         was introduced through the mouth, and advanced to the                          second part of duodenum. The upper GI endoscopy was                         accomplished without difficulty. The patient tolerated                         the procedure well. Findings:      The examined esophagus was normal.      The entire examined stomach was normal. Biopsies were taken with a cold       forceps for histology.      Two non-bleeding cratered duodenal ulcers with no stigmata of bleeding       were found in the duodenal bulb and in the second portion of the       duodenum. Impression:            - Normal esophagus.                        - Normal stomach. Biopsied.                        - Non-bleeding duodenal ulcers with no stigmata of                         bleeding. Recommendation:        - Return patient to hospital ward for ongoing care.                        - Resume previous diet.                        - Continue present medications.                        - Await pathology results.                        - Perform a colonoscopy today.                        - No aspirin, ibuprofen, naproxen, or other                         non-steroidal anti-inflammatory drugs.                        - Use a proton pump inhibitor PO daily. Procedure Code(s):     --- Professional ---                        [phone], Esophagogastroduodenoscopy, flexible,  transoral; with biopsy, single or multiple Diagnosis Code(s):     --- Professional ---                        D50.0, Iron deficiency anemia secondary to blood loss                         (chronic)                        K26.9, Duodenal ulcer, unspecified as acute or                         chronic, without hemorrhage or perforation CPT copyright 2022 American Medical Association. All rights reserved. The codes documented in this report are preliminary and upon coder review may  be revised to meet current compliance requirements. Midge Minium MD, MD 07/07/2023 10:44:54 AM This report  has been signed electronically. Number of Addenda: 0 Note Initiated On: 07/07/2023 10:26 AM Estimated Blood Loss:  Estimated blood loss: none.      [facility]

## 2023-07-07 NOTE — Progress Notes (Signed)
Progress Note   Patient: Terri Burns ZOX:[id number] DOB: 1950-12-15 DOA: 07/02/2023     5 DOS: the patient was seen and examined on 07/07/2023     Brief hospital course:   HPI on admission "Terri Burns is a 73 y.o. female with medical history significant of COPD, chronic HFpEF, CAD status post stenting on Brilinta, iron deficiency anemia, anxiety/depression, GERD, HTN, HLD, presented with worsening of cough wheezing shortness of breath and malaise. "   See H&P for full HPI on admission & ED course.   Pt was admitted for further evaluation and management of mild COPD exacerbation and symptomatic acute on chronic iron deficiency anemia requiring blood transfusion.  GI consulted.   Further hospital course and management as outlined below.     Assessment and Plan:   Acute hypoxic respiratory failure Multifactorial, the main driving force appears to be symptomatic anemia/highoutput HF, possibly concurrent mild COPD exacerbation. 1/28 - weaned from 8 L O2 yesterday >> 4 L today --No diuresis initiated on admission due to soft BP's --Monitor volume status closely --Mgmt of undelying issues as outlined --Wean O2 as tolerated, target sats < 88% --Diuresis as needed and as BP tolerates   Acute on chronic HFpEF decompensation Continue management as above   COPD with mild acute exacerbation Persistent infiltrates on the chest x-ray, but pt recently completed full course of ceftriaxone and azithromycin 2 weeks ago to treat similar CAP course. --No antibiotics indicated, procal negative Continue ICS and LABA --DuoNebs and as needed albuterol --Incentive symmetry and flutter valve --Monitor closely and consider adding systemic steroid if progressing severity   Acute on chronic symptomatic anemia, iron deficiency GI bleed, acute on chronic likely upper GI Continue PPI therapy Status post 1 unit blood transfusion on 07/06/2023 Case discussed with gastroenterologist EGD showed healing  duodenal ulcer Colonoscopy showed 1 polyp as well as diverticulosis  Patient counseled to avoid NSAID use   CAD Continue aspirin, hold off Brilinta Continue statin and hold off home BP meds   HTN Monitor blood pressure closely     Subjective:  Patient seen and examined at bedside this morning Patient being planned for EGD and colonoscopy today Denied any bleeding   Physical Exam:   General exam: awake, alert, no acute distress, chronically ill appearing HEENT: moist mucus membranes, hearing grossly normal  Respiratory system: improved aeration, no active wheezing, no rhonchi, normal respiratory effort at rest on 2 L/min [place] o2. Cardiovascular system: normal S1/S2, RRR, no pedal edema.   Gastrointestinal system: soft, NT, ND Central nervous system: A&O x 3. no gross focal neurologic deficits, normal speech Skin: dry, intact, normal temperature Psychiatry: normal mood, flat affect       Data Reviewed:      Latest Ref Rng & Units 07/07/2023    5:05 AM 07/06/2023    5:23 AM 07/04/2023    5:34 AM  BMP  Glucose 70 - 99 mg/dL 79  77  86   BUN 8 - 23 mg/dL <5  <5  16   Creatinine 0.44 - 1.00 mg/dL 8.11  9.14  7.82   Sodium 135 - 145 mmol/L 139  139  139   Potassium 3.5 - 5.1 mmol/L 4.0  4.0  4.2   Chloride 98 - 111 mmol/L 105  108  110   CO2 22 - 32 mmol/L 27  24  25    Calcium 8.9 - 10.3 mg/dL 8.0  7.6  7.9     Vitals:   07/07/23 9562  07/07/23 1018 07/07/23 1100 07/07/23 1159  BP: 133/70 (!) 162/81 124/78 (!) 143/76  Pulse: 70 79 74 72  Resp: 16 16 14 16   Temp: 98.1 F (36.7 C) (!) 96.8 F (36 C) (!) 97.4 F (36.3 C) 98.3 F (36.8 C)  TempSrc:  Temporal Temporal   SpO2: 100% 100% 100% 100%  Weight:      Height:          Latest Ref Rng & Units 07/07/2023    5:05 AM 07/06/2023    5:23 AM 07/04/2023   10:40 PM  CBC  WBC 4.0 - 10.5 K/uL 4.7  4.2    Hemoglobin 12.0 - 15.0 g/dL 8.7  6.9  7.2   Hematocrit 36.0 - 46.0 % 26.7  21.5  22.2   Platelets 150 - 400 K/uL 121   113       Author: Loyce Dys, MD 07/07/2023 2:19 PM  For on call review [email or web address].

## 2023-07-07 NOTE — Transfer of Care (Signed)
Immediate Anesthesia Transfer of Care Note  Patient: Terri Burns  Procedure(s) Performed: COLONOSCOPY WITH PROPOFOL ESOPHAGOGASTRODUODENOSCOPY (EGD) WITH PROPOFOL BIOPSY POLYPECTOMY  Patient Location: PACU  Anesthesia Type:MAC  Level of Consciousness: awake and alert   Airway & Oxygen Therapy: Patient Spontanous Breathing and Patient connected to nasal cannula oxygen  Post-op Assessment: Report given to RN and Post -op Vital signs reviewed and stable  Post vital signs: stable  Last Vitals:  Vitals Value Taken Time  BP 124/78 07/07/23 1101  Temp    Pulse 74 07/07/23 1102  Resp 14 07/07/23 1102  SpO2 100 % 07/07/23 1102  Vitals shown include unfiled device data.  Last Pain:  Vitals:   07/07/23 1018  TempSrc: Temporal  PainSc: 6       Patients Stated Pain Goal: 0 (07/07/23 0357)  Complications: No notable events documented.

## 2023-07-07 NOTE — Anesthesia Preprocedure Evaluation (Signed)
Anesthesia Evaluation  Patient identified by MRN, date of birth, ID band Patient awake    Reviewed: Allergy & Precautions, NPO status , Patient's Chart, lab work & pertinent test results  Airway Mallampati: III  TM Distance: >3 FB Neck ROM: full    Dental  (+) Poor Dentition, Missing, Upper Dentures, Dental Advisory Given   Pulmonary neg pulmonary ROS, asthma , COPD,  COPD inhaler, Current Smoker and Patient abstained from smoking.   Pulmonary exam normal  + decreased breath sounds      Cardiovascular Exercise Tolerance: Poor hypertension, Pt. on medications + CAD, + Cardiac Stents, + Peripheral Vascular Disease and +CHF  negative cardio ROS Normal cardiovascular exam Rhythm:Regular Rate:Normal     Neuro/Psych   Anxiety Depression    negative neurological ROS  negative psych ROS   GI/Hepatic negative GI ROS, Neg liver ROS,GERD  Medicated,,  Endo/Other  negative endocrine ROS    Renal/GU negative Renal ROS  negative genitourinary   Musculoskeletal   Abdominal   Peds negative pediatric ROS (+)  Hematology negative hematology ROS (+) Blood dyscrasia, anemia   Anesthesia Other Findings Past Medical History: No date: COPD (chronic obstructive pulmonary disease) (HCC) No date: Depression No date: GERD (gastroesophageal reflux disease) 05/12/2020: History of SCC (squamous cell carcinoma) of skin     Comment:  right temple  well differentiated  No date: Hyperlipidemia No date: Hypertension 06/12/2019: Squamous cell carcinoma of skin     Comment:  right temple  Past Surgical History: 02/22/2021: CORONARY STENT INTERVENTION; N/A     Comment:  Procedure: CORONARY STENT INTERVENTION;  Surgeon: Iran Ouch, MD;  Location: [facility] INVASIVE CV LAB;                Service: Cardiovascular;  Laterality: N/A; 02/22/2021: LEFT HEART CATH AND CORONARY ANGIOGRAPHY; N/A     Comment:  Procedure: LEFT HEART CATH AND  CORONARY ANGIOGRAPHY;                Surgeon: Iran Ouch, MD;  Location: [facility] INVASIVE               CV LAB;  Service: Cardiovascular;  Laterality: N/A; 10/2003: NECK SURGERY; N/A 2004: SHOULDER SURGERY; Left  BMI    Body Mass Index: 25.16 kg/m      Reproductive/Obstetrics negative OB ROS                             Anesthesia Physical Anesthesia Plan  ASA: 3  Anesthesia Plan: General   Post-op Pain Management:    Induction: Intravenous  PONV Risk Score and Plan: Propofol infusion and TIVA  Airway Management Planned: Natural Airway and Nasal Cannula  Additional Equipment:   Intra-op Plan:   Post-operative Plan:   Informed Consent: I have reviewed the patients History and Physical, chart, labs and discussed the procedure including the risks, benefits and alternatives for the proposed anesthesia with the patient or authorized representative who has indicated his/her understanding and acceptance.     Dental Advisory Given  Plan Discussed with: CRNA  Anesthesia Plan Comments:        Anesthesia Quick Evaluation

## 2023-07-08 DIAGNOSIS — K922 Gastrointestinal hemorrhage, unspecified: Secondary | ICD-10-CM | POA: Diagnosis not present

## 2023-07-08 DIAGNOSIS — D649 Anemia, unspecified: Secondary | ICD-10-CM | POA: Diagnosis not present

## 2023-07-08 LAB — CBC WITH DIFFERENTIAL/PLATELET
Abs Immature Granulocytes: 0.03 10*3/uL (ref 0.00–0.07)
Basophils Absolute: 0 10*3/uL (ref 0.0–0.1)
Basophils Relative: 0 %
Eosinophils Absolute: 0.3 10*3/uL (ref 0.0–0.5)
Eosinophils Relative: 6 %
HCT: 27.6 % — ABNORMAL LOW (ref 36.0–46.0)
Hemoglobin: 8.9 g/dL — ABNORMAL LOW (ref 12.0–15.0)
Immature Granulocytes: 1 %
Lymphocytes Relative: 18 %
Lymphs Abs: 1 10*3/uL (ref 0.7–4.0)
MCH: 29.4 pg (ref 26.0–34.0)
MCHC: 32.2 g/dL (ref 30.0–36.0)
MCV: 91.1 fL (ref 80.0–100.0)
Monocytes Absolute: 0.5 10*3/uL (ref 0.1–1.0)
Monocytes Relative: 8 %
Neutro Abs: 4.1 10*3/uL (ref 1.7–7.7)
Neutrophils Relative %: 67 %
Platelets: 138 10*3/uL — ABNORMAL LOW (ref 150–400)
RBC: 3.03 MIL/uL — ABNORMAL LOW (ref 3.87–5.11)
RDW: 18.5 % — ABNORMAL HIGH (ref 11.5–15.5)
WBC: 5.9 10*3/uL (ref 4.0–10.5)
nRBC: 0 % (ref 0.0–0.2)

## 2023-07-08 LAB — BASIC METABOLIC PANEL
Anion gap: 8 (ref 5–15)
BUN: 9 mg/dL (ref 8–23)
CO2: 27 mmol/L (ref 22–32)
Calcium: 8.1 mg/dL — ABNORMAL LOW (ref 8.9–10.3)
Chloride: 104 mmol/L (ref 98–111)
Creatinine, Ser: 0.52 mg/dL (ref 0.44–1.00)
GFR, Estimated: >60 mL/min (ref >60)
Glucose, Bld: 97 mg/dL (ref 70–99)
Potassium: 3.8 mmol/L (ref 3.5–5.1)
Sodium: 139 mmol/L (ref 135–145)

## 2023-07-08 MED ORDER — PANTOPRAZOLE SODIUM 40 MG PO TBEC
40.0000 mg | DELAYED_RELEASE_TABLET | Freq: Two times a day (BID) | ORAL | Status: DC
  Administered 2023-07-08 – 2023-07-10 (×4): 40 mg via ORAL
  Filled 2023-07-08 (×5): qty 1

## 2023-07-08 NOTE — Progress Notes (Signed)
Physical Therapy Treatment Patient Details Name: Terri Burns MRN: [id number] DOB: 1951-01-08 Today's Date: 07/08/2023   History of Present Illness Pt is a 73 y.o. female presenting to [facility] ED with worsening of cough, wheezing, SOB and malaise. Found to have GI bleed, CHF, acute hypoxic respiratory failure, COPD exacerbation and chronic symptomatic anemia. PMH significant for chronic HFpEF, CAD status post stenting on Brilinta, iron deficiency anemia, anxiety/depression, GERD, HTN, & HLD.    PT Comments  Pt tolerated treatment well today and was able to improve overall O2 dependence, activity tolerance, ambulation distance, balance, and assist levels since last session. Treatment session focused on activity tolerance and insight into deficits. Today, she was supervision for bed mobility, supervision for transfers with RW, and CGA to ambulate 41ft x1 and 19ft x1 with RW.   Decreased activity tolerance noted by frequent rest breaks of 1+ minute for recovery. Able to wean from 4 to 2L O2 via [place] with SpO2 >/= 92% throughout session and elevated HR to 104bpm at max during session. Able to perform x10 BLE therex after ambulation with full AROM and good eccentric control.   She will continue to benefit from skilled acute PT services to address deficits and improve overall safety with functional mobility. Will continue per POC.      If plan is discharge home, recommend the following: A little help with walking and/or transfers;A little help with bathing/dressing/bathroom;Assist for transportation;Help with stairs or ramp for entrance;Assistance with cooking/housework   Can travel by private vehicle     Yes  Equipment Recommendations  None recommended by PT       Precautions / Restrictions Precautions Precautions: Fall Precaution Comments: watch O2 Restrictions Weight Bearing Restrictions Per Provider Order: No     Mobility  Bed Mobility Overal bed mobility: Needs Assistance Bed Mobility:  Supine to Sit     Supine to sit: Supervision     General bed mobility comments: supervision for safety to sit EOB, HOB elevated, use of BUE for support. Increased time/effort    Transfers Overall transfer level: Needs assistance Equipment used: Rolling walker (2 wheels) Transfers: Sit to/from Stand Sit to Stand: Supervision           General transfer comment: supervision for safety to perform multiple STS tranfers to/from EOB and recliner with RW. VErbal cues for safety, sequencing, and hand placement. Demonstrates good eccentric lowering with proper hand placement.    Ambulation/Gait Ambulation/Gait assistance: Supervision Gait Distance (Feet): 36 Feet (28ft x1 (seated rest break); 42ft x1 (seated rest break)) Assistive device: Rolling walker (2 wheels)         General Gait Details: Supervision for safety to ambulate multiple bouts with RW. Demonstrates slowed cadence with narrow BOS and decreased step length/foot clearance bilaterally. Increased time/effort with 180deg turns.      Balance Overall balance assessment: Needs assistance Sitting-balance support: Feet supported, No upper extremity supported Sitting balance-Leahy Scale: Good     Standing balance support: During functional activity, Bilateral upper extremity supported Standing balance-Leahy Scale: Good Standing balance comment: standing balance in RW                            Cognition Arousal: Alert Behavior During Therapy: WFL for tasks assessed/performed Overall Cognitive Status: Within Functional Limits for tasks assessed  Exercises Other Exercises Other Exercises: Participates in bed mobility, multiple transfers with RW, and x2 gait trials (62ft x1 and 63ft x1 with seated rest break between). Also able to complete x10 BLE therex including: seated marching, LAQ, isometric hip ADD, and ankle pumps. Other Exercises: Educated re: PT  role/POC, DC recommendations, safety with functional mobility, appropriate progression of exercise.    General Comments General comments (skin integrity, edema, etc.): Able to wean O2 from 4 to 2L with SpO2 >/= 92% throughout session, elevated HR to 104bpm at max during mobility. DOE with RPE of 4-6/10 indicating "moderate activity"      Pertinent Vitals/Pain Pain Assessment Pain Assessment: 0-10 Pain Score: 5  Pain Location: Chronic back pain per patient Pain Descriptors / Indicators: Sore, Aching Pain Intervention(s): Monitored during session     PT Goals (current goals can now be found in the care plan section) Acute Rehab PT Goals Patient Stated Goal: to feel better PT Goal Formulation: With patient Time For Goal Achievement: 07/18/23 Potential to Achieve Goals: Good Progress towards PT goals: Progressing toward goals    Frequency    Min 1X/week       AM-PAC PT "6 Clicks" Mobility   Outcome Measure  Help needed turning from your back to your side while in a flat bed without using bedrails?: A Little Help needed moving from lying on your back to sitting on the side of a flat bed without using bedrails?: A Little Help needed moving to and from a bed to a chair (including a wheelchair)?: A Little Help needed standing up from a chair using your arms (e.g., wheelchair or bedside chair)?: A Little Help needed to walk in hospital room?: A Little Help needed climbing 3-5 steps with a railing? : A Little 6 Click Score: 18    End of Session Equipment Utilized During Treatment: Gait belt;Oxygen Activity Tolerance: Patient tolerated treatment well Patient left: with call bell/phone within reach;in chair;Other (comment) Nurse Communication: Mobility status PT Visit Diagnosis: Muscle weakness (generalized) (M62.81);Unsteadiness on feet (R26.81);Difficulty in walking, not elsewhere classified (R26.2)     Time: 4098-1191 PT Time Calculation (min) (ACUTE ONLY): 34  min  Charges:    $Therapeutic Exercise: 8-22 mins $Therapeutic Activity: 8-22 mins PT General Charges $$ ACUTE PT VISIT: 1 Visit                      Vira Blanco, PT, DPT 12:23 PM,07/08/23 Physical Therapist - [facility]

## 2023-07-08 NOTE — Plan of Care (Signed)

## 2023-07-08 NOTE — Progress Notes (Signed)
Progress Note   Patient: Terri Burns ZOX:[id number] DOB: 12/26/1950 DOA: 07/02/2023     6 DOS: the patient was seen and examined on 07/08/2023      Brief hospital course:   HPI on admission "Terri Burns is a 73 y.o. female with medical history significant of COPD, chronic HFpEF, CAD status post stenting on Brilinta, iron deficiency anemia, anxiety/depression, GERD, HTN, HLD, presented with worsening of cough wheezing shortness of breath and malaise. "   See H&P for full HPI on admission & ED course.   Pt was admitted for further evaluation and management of mild COPD exacerbation and symptomatic acute on chronic iron deficiency anemia requiring blood transfusion.  GI consulted.   Further hospital course and management as outlined below.     Assessment and Plan:   Acute hypoxic respiratory failure Multifactorial, the main driving force appears to be symptomatic anemia/highoutput HF, possibly concurrent mild COPD exacerbation. 1/28 - weaned from 8 L O2 yesterday >> 4 L today --No diuresis initiated on admission due to soft BP's --Monitor volume status closely --Mgmt of undelying issues as outlined --Wean O2 as tolerated, target sats < 88% --Diuresis as needed and as BP tolerates   Acute on chronic HFpEF decompensation Continue management as above   COPD with mild acute exacerbation Persistent infiltrates on the chest x-ray, but pt recently completed full course of ceftriaxone and azithromycin 2 weeks ago to treat similar CAP course. --No antibiotics indicated, procal negative Continue ICS and LABA --DuoNebs and as needed albuterol --Incentive symmetry and flutter valve --Monitor closely and consider adding systemic steroid if progressing severity   Acute on chronic symptomatic anemia, iron deficiency GI bleed, acute on chronic likely upper GI Continue PPI therapy Status post 1 unit blood transfusion on 07/06/2023 Case discussed with gastroenterologist EGD showed healing  duodenal ulcer Colonoscopy showed 1 polyp as well as diverticulosis  Patient counseled to avoid NSAID use   CAD Continue aspirin, hold off Brilinta Continue statin and hold off home BP meds   HTN Monitor blood pressure closely     Subjective:  Underwent EGD yesterday PT has recommended rehab however patient is looking at possibly going home if she improves Denied worsening nausea vomiting abdominal pain or chest pain   Physical Exam:   General exam: awake, alert, no acute distress, chronically ill appearing HEENT: moist mucus membranes, hearing grossly normal  Respiratory system: improved aeration, no active wheezing, no rhonchi, normal respiratory effort at rest on 2 L/min [place] o2. Cardiovascular system: normal S1/S2, RRR, no pedal edema.   Gastrointestinal system: soft, NT, ND Central nervous system: A&O x 3. no gross focal neurologic deficits, normal speech Skin: dry, intact, normal temperature Psychiatry: normal mood, flat affect       Data Reviewed:    Latest Ref Rng & Units 07/08/2023    4:14 AM 07/07/2023    5:05 AM 07/06/2023    5:23 AM  BMP  Glucose 70 - 99 mg/dL 97  79  77   BUN 8 - 23 mg/dL 9  <5  <5   Creatinine 0.44 - 1.00 mg/dL 8.11  9.14  7.82   Sodium 135 - 145 mmol/L 139  139  139   Potassium 3.5 - 5.1 mmol/L 3.8  4.0  4.0   Chloride 98 - 111 mmol/L 104  105  108   CO2 22 - 32 mmol/L 27  27  24    Calcium 8.9 - 10.3 mg/dL 8.1  8.0  7.6  Vitals:   07/08/23 0448 07/08/23 0831 07/08/23 1233 07/08/23 1346  BP:   112/76   Pulse:   80   Resp:   18   Temp:   (!) 97.4 F (36.3 C)   TempSrc:   Oral   SpO2:  98% 99% 98%  Weight: 54.8 kg     Height:          Latest Ref Rng & Units 07/08/2023    4:14 AM 07/07/2023    5:05 AM 07/06/2023    5:23 AM  CBC  WBC 4.0 - 10.5 K/uL 5.9  4.7  4.2   Hemoglobin 12.0 - 15.0 g/dL 8.9  8.7  6.9   Hematocrit 36.0 - 46.0 % 27.6  26.7  21.5   Platelets 150 - 400 K/uL 138  121  113      Author: Loyce Dys,  MD 07/08/2023 5:07 PM  For on call review [email or web address].

## 2023-07-08 NOTE — TOC Progression Note (Signed)
Transition of Care [facility]) - Progression Note    Patient Details  Name: Terri Burns MRN: [id number] Date of Birth: [birth year]  [facility] CM/SW Contact  Maree Krabbe, LCSW Phone Number: 07/08/2023, 1:14 PM  Clinical Narrative:   SW followed up with pt as she worked with PT this morning. Pt states she is going to discharge home with hh and not SNF. Pt states she has a walker at home. Pt states she is on 02 here but not at home. SW will follow along to order 02 if pt will dc home 02.         Expected Discharge Plan and Services                                               Social Determinants of Health (SDOH) Interventions SDOH Screenings   Food Insecurity: No Food Insecurity (07/03/2023)  Housing: Low Risk  (07/04/2023)  Transportation Needs: No Transportation Needs (07/04/2023)  Utilities: Not At Risk (07/03/2023)  Alcohol Screen: Low Risk  (07/13/2021)  Depression (PHQ2-9): Low Risk  (05/11/2022)  Financial Resource Strain: Medium Risk (07/04/2023)  Physical Activity: Insufficiently Active (07/13/2021)  Social Connections: Socially Isolated (07/03/2023)  Stress: No Stress Concern Present (07/13/2021)  Tobacco Use: High Risk (07/07/2023)    Readmission Risk Interventions     No data to display

## 2023-07-09 DIAGNOSIS — D649 Anemia, unspecified: Secondary | ICD-10-CM | POA: Diagnosis not present

## 2023-07-09 DIAGNOSIS — K922 Gastrointestinal hemorrhage, unspecified: Secondary | ICD-10-CM | POA: Diagnosis not present

## 2023-07-09 LAB — CBC WITH DIFFERENTIAL/PLATELET
Abs Immature Granulocytes: 0.03 10*3/uL (ref 0.00–0.07)
Basophils Absolute: 0 10*3/uL (ref 0.0–0.1)
Basophils Relative: 0 %
Eosinophils Absolute: 0.2 10*3/uL (ref 0.0–0.5)
Eosinophils Relative: 5 %
HCT: 28.1 % — ABNORMAL LOW (ref 36.0–46.0)
Hemoglobin: 9.1 g/dL — ABNORMAL LOW (ref 12.0–15.0)
Immature Granulocytes: 1 %
Lymphocytes Relative: 15 %
Lymphs Abs: 0.7 10*3/uL (ref 0.7–4.0)
MCH: 29.2 pg (ref 26.0–34.0)
MCHC: 32.4 g/dL (ref 30.0–36.0)
MCV: 90.1 fL (ref 80.0–100.0)
Monocytes Absolute: 0.4 10*3/uL (ref 0.1–1.0)
Monocytes Relative: 9 %
Neutro Abs: 3.1 10*3/uL (ref 1.7–7.7)
Neutrophils Relative %: 70 %
Platelets: 142 10*3/uL — ABNORMAL LOW (ref 150–400)
RBC: 3.12 MIL/uL — ABNORMAL LOW (ref 3.87–5.11)
RDW: 18.1 % — ABNORMAL HIGH (ref 11.5–15.5)
WBC: 4.4 10*3/uL (ref 4.0–10.5)
nRBC: 0 % (ref 0.0–0.2)

## 2023-07-09 LAB — BASIC METABOLIC PANEL
Anion gap: 10 (ref 5–15)
BUN: 8 mg/dL (ref 8–23)
CO2: 27 mmol/L (ref 22–32)
Calcium: 8 mg/dL — ABNORMAL LOW (ref 8.9–10.3)
Chloride: 103 mmol/L (ref 98–111)
Creatinine, Ser: 0.53 mg/dL (ref 0.44–1.00)
GFR, Estimated: >60 mL/min (ref >60)
Glucose, Bld: 86 mg/dL (ref 70–99)
Potassium: 3.9 mmol/L (ref 3.5–5.1)
Sodium: 140 mmol/L (ref 135–145)

## 2023-07-09 MED ORDER — ORAL CARE MOUTH RINSE: 15.0000 mL | OROMUCOSAL | Status: DC | PRN

## 2023-07-09 NOTE — Progress Notes (Signed)
Patient ambulated 4 laps in hall on room air. Oxygen saturation dropped to 88%

## 2023-07-09 NOTE — Plan of Care (Signed)

## 2023-07-09 NOTE — Plan of Care (Signed)
  Problem: Education: Goal: Knowledge of General Education information will improve Description: Including pain rating scale, medication(s)/side effects and non-pharmacologic comfort measures 07/09/2023 0448 by Martha Clan, RN Outcome: Progressing 07/09/2023 0447 by Martha Clan, RN Outcome: Progressing   Problem: Health Behavior/Discharge Planning: Goal: Ability to manage health-related needs will improve 07/09/2023 0448 by Martha Clan, RN Outcome: Progressing 07/09/2023 0447 by Martha Clan, RN Outcome: Progressing   Problem: Clinical Measurements: Goal: Ability to maintain clinical measurements within normal limits will improve 07/09/2023 0448 by Martha Clan, RN Outcome: Progressing 07/09/2023 0447 by Martha Clan, RN Outcome: Progressing Goal: Will remain free from infection 07/09/2023 0448 by Martha Clan, RN Outcome: Progressing 07/09/2023 0447 by Martha Clan, RN Outcome: Progressing Goal: Diagnostic test results will improve 07/09/2023 0448 by Martha Clan, RN Outcome: Progressing 07/09/2023 0447 by Martha Clan, RN Outcome: Progressing Goal: Respiratory complications will improve 07/09/2023 0448 by Martha Clan, RN Outcome: Progressing 07/09/2023 0447 by Martha Clan, RN Outcome: Progressing Goal: Cardiovascular complication will be avoided 07/09/2023 0448 by Martha Clan, RN Outcome: Progressing 07/09/2023 0447 by Martha Clan, RN Outcome: Progressing   Problem: Activity: Goal: Risk for activity intolerance will decrease 07/09/2023 0448 by Martha Clan, RN Outcome: Progressing 07/09/2023 0447 by Martha Clan, RN Outcome: Progressing   Problem: Nutrition: Goal: Adequate nutrition will be maintained 07/09/2023 0448 by Martha Clan, RN Outcome: Progressing 07/09/2023 0447 by Martha Clan, RN Outcome: Progressing   Problem: Coping: Goal: Level of anxiety will decrease 07/09/2023  0448 by Martha Clan, RN Outcome: Progressing 07/09/2023 0447 by Martha Clan, RN Outcome: Progressing   Problem: Elimination: Goal: Will not experience complications related to bowel motility 07/09/2023 0448 by Martha Clan, RN Outcome: Progressing 07/09/2023 0447 by Martha Clan, RN Outcome: Progressing Goal: Will not experience complications related to urinary retention 07/09/2023 0448 by Martha Clan, RN Outcome: Progressing 07/09/2023 0447 by Martha Clan, RN Outcome: Progressing   Problem: Pain Managment: Goal: General experience of comfort will improve and/or be controlled 07/09/2023 0448 by Martha Clan, RN Outcome: Progressing 07/09/2023 0447 by Martha Clan, RN Outcome: Progressing   Problem: Safety: Goal: Ability to remain free from injury will improve 07/09/2023 0448 by Martha Clan, RN Outcome: Progressing 07/09/2023 0447 by Martha Clan, RN Outcome: Progressing   Problem: Skin Integrity: Goal: Risk for impaired skin integrity will decrease 07/09/2023 0448 by Martha Clan, RN Outcome: Progressing 07/09/2023 0447 by Martha Clan, RN Outcome: Progressing

## 2023-07-09 NOTE — Progress Notes (Signed)
Progress Note   Patient: Terri Burns:[id number] DOB: Jun 19, 1950 DOA: 07/02/2023     7 DOS: the patient was seen and examined on 07/09/2023     Brief hospital course:   HPI on admission "Terri Burns is a 73 y.o. female with medical history significant of COPD, chronic HFpEF, CAD status post stenting on Brilinta, iron deficiency anemia, anxiety/depression, GERD, HTN, HLD, presented with worsening of cough wheezing shortness of breath and malaise. "   See H&P for full HPI on admission & ED course.   Pt was admitted for further evaluation and management of mild COPD exacerbation and symptomatic acute on chronic iron deficiency anemia requiring blood transfusion.  GI consulted.   Further hospital course and management as outlined below.     Assessment and Plan:   Acute hypoxic respiratory failure Multifactorial, the main driving force appears to be symptomatic anemia/highoutput HF, possibly concurrent mild COPD exacerbation. --No diuresis initiated on admission due to soft BP's --Monitor volume status closely --Mgmt of undelying issues as outlined --Wean O2 as tolerated, target sats < 88% --Diuresis as needed and as BP tolerates   Acute on chronic HFpEF decompensation Continue management as above   COPD with mild acute exacerbation Persistent infiltrates on the chest x-ray, but pt recently completed full course of ceftriaxone and azithromycin 2 weeks ago to treat similar CAP course. --No antibiotics indicated, procal negative Continue ICS and LABA --DuoNebs and as needed albuterol --Incentive symmetry and flutter valve --Monitor closely and consider adding systemic steroid if progressing severity   Acute on chronic symptomatic anemia, iron deficiency GI bleed, acute on chronic likely upper GI Continue PPI Status post 1 unit blood transfusion on 07/06/2023 Case discussed with gastroenterologist EGD showed healing duodenal ulcer Colonoscopy showed 1 polyp as well as  diverticulosis  Patient counseled to avoid NSAID use   CAD Continue aspirin, hold off Brilinta Continue statin and hold off home BP meds   HTN Continue to monitor blood pressure closely     Subjective:  Seen and examined at bedside this morning Patient is looking forward to going home with home health rather than rehab Patient underwent a walk test today that showed she does not meet criteria for home oxygen She does not want to be discharged home today but looking for discharge home tomorrow as she put her room in order   Physical Exam:   General exam: awake, alert, no acute distress, chronically ill appearing HEENT: moist mucus membranes, hearing grossly normal  Respiratory system: improved aeration, no active wheezing, no rhonchi, normal respiratory effort at rest on 2 L/min [place] o2. Cardiovascular system: normal S1/S2, RRR, no pedal edema.   Gastrointestinal system: soft, NT, ND Central nervous system: A&O x 3. no gross focal neurologic deficits, normal speech Skin: dry, intact, normal temperature Psychiatry: normal mood, flat affect       Data Reviewed:    Latest Ref Rng & Units 07/09/2023    5:23 AM 07/08/2023    4:14 AM 07/07/2023    5:05 AM  BMP  Glucose 70 - 99 mg/dL 86  97  79   BUN 8 - 23 mg/dL 8  9  <5   Creatinine 9.56 - 1.00 mg/dL 2.13  0.86  5.78   Sodium 135 - 145 mmol/L 140  139  139   Potassium 3.5 - 5.1 mmol/L 3.9  3.8  4.0   Chloride 98 - 111 mmol/L 103  104  105   CO2 22 - 32 mmol/L  27  27  27    Calcium 8.9 - 10.3 mg/dL 8.0  8.1  8.0        Latest Ref Rng & Units 07/09/2023    5:23 AM 07/08/2023    4:14 AM 07/07/2023    5:05 AM  CBC  WBC 4.0 - 10.5 K/uL 4.4  5.9  4.7   Hemoglobin 12.0 - 15.0 g/dL 9.1  8.9  8.7   Hematocrit 36.0 - 46.0 % 28.1  27.6  26.7   Platelets 150 - 400 K/uL 142  138  121      Vitals:   07/09/23 0901 07/09/23 1132 07/09/23 1338 07/09/23 1639  BP: 118/72 (!) 139/94  120/85  Pulse: 91 81  84  Resp:      Temp: 98.2 F (36.8  C) 98.9 F (37.2 C)  98.4 F (36.9 C)  TempSrc:    Oral  SpO2: 100% 99% 99% 100%  Weight:      Height:         Author: Loyce Dys, MD 07/09/2023 5:09 PM  For on call review [email or web address].

## 2023-07-10 ENCOUNTER — Encounter: Payer: Self-pay | Admitting: Gastroenterology

## 2023-07-10 DIAGNOSIS — K219 Gastro-esophageal reflux disease without esophagitis: Secondary | ICD-10-CM

## 2023-07-10 DIAGNOSIS — I10 Essential (primary) hypertension: Secondary | ICD-10-CM

## 2023-07-10 DIAGNOSIS — K922 Gastrointestinal hemorrhage, unspecified: Secondary | ICD-10-CM | POA: Diagnosis not present

## 2023-07-10 LAB — BASIC METABOLIC PANEL
Anion gap: 6 (ref 5–15)
BUN: 11 mg/dL (ref 8–23)
CO2: 28 mmol/L (ref 22–32)
Calcium: 8 mg/dL — ABNORMAL LOW (ref 8.9–10.3)
Chloride: 104 mmol/L (ref 98–111)
Creatinine, Ser: 0.68 mg/dL (ref 0.44–1.00)
GFR, Estimated: >60 mL/min (ref >60)
Glucose, Bld: 135 mg/dL — ABNORMAL HIGH (ref 70–99)
Potassium: 4 mmol/L (ref 3.5–5.1)
Sodium: 138 mmol/L (ref 135–145)

## 2023-07-10 LAB — CBC WITH DIFFERENTIAL/PLATELET
Abs Immature Granulocytes: 0.03 10*3/uL (ref 0.00–0.07)
Basophils Absolute: 0 10*3/uL (ref 0.0–0.1)
Basophils Relative: 1 %
Eosinophils Absolute: 0.3 10*3/uL (ref 0.0–0.5)
Eosinophils Relative: 6 %
HCT: 28.6 % — ABNORMAL LOW (ref 36.0–46.0)
Hemoglobin: 9.1 g/dL — ABNORMAL LOW (ref 12.0–15.0)
Immature Granulocytes: 1 %
Lymphocytes Relative: 27 %
Lymphs Abs: 1.1 10*3/uL (ref 0.7–4.0)
MCH: 29.2 pg (ref 26.0–34.0)
MCHC: 31.8 g/dL (ref 30.0–36.0)
MCV: 91.7 fL (ref 80.0–100.0)
Monocytes Absolute: 0.5 10*3/uL (ref 0.1–1.0)
Monocytes Relative: 12 %
Neutro Abs: 2.3 10*3/uL (ref 1.7–7.7)
Neutrophils Relative %: 53 %
Platelets: 168 10*3/uL (ref 150–400)
RBC: 3.12 MIL/uL — ABNORMAL LOW (ref 3.87–5.11)
RDW: 18 % — ABNORMAL HIGH (ref 11.5–15.5)
WBC: 4.2 10*3/uL (ref 4.0–10.5)
nRBC: 0 % (ref 0.0–0.2)

## 2023-07-10 LAB — SURGICAL PATHOLOGY

## 2023-07-10 MED ORDER — METOPROLOL SUCCINATE ER 25 MG PO TB24: 12.5000 mg | ORAL_TABLET | Freq: Every evening | ORAL | 1 refills | Status: DC

## 2023-07-10 MED ORDER — PANTOPRAZOLE SODIUM 40 MG PO TBEC: 40.0000 mg | DELAYED_RELEASE_TABLET | Freq: Two times a day (BID) | ORAL | 1 refills | Status: DC

## 2023-07-10 MED ORDER — LOSARTAN POTASSIUM 25 MG PO TABS: 25.0000 mg | ORAL_TABLET | Freq: Every evening | ORAL | 1 refills | Status: DC

## 2023-07-10 NOTE — Plan of Care (Signed)

## 2023-07-10 NOTE — Care Management Important Message (Signed)
Important Message  Patient Details  Name: Terri Burns MRN: [id number] Date of Birth: 1950/07/25   Important Message Given:  Yes - Medicare IM     Sherilyn Banker 07/10/2023, 2:22 PM

## 2023-07-10 NOTE — TOC Transition Note (Signed)
Transition of Care [facility]) - Discharge Note   Patient Details  Name: Terri Burns MRN: [id number] Date of Birth: 12/24/1950  Transition of Care [facility]) CM/SW Contact:  Truddie Hidden, RN Phone Number: 07/10/2023, 11:41 AM   Clinical Narrative:    Spoke with patient regarding home discharge for today. Her son will pick her up. She was advised [facility] will contact her to scheduled her resumption of care.   Contacted Sarah from [facility] to notify of discharge today  TOC signing off.           Patient Goals and CMS Choice            Discharge Placement                       Discharge Plan and Services Additional resources added to the After Visit Summary for                                       Social Drivers of Health (SDOH) Interventions SDOH Screenings   Food Insecurity: No Food Insecurity (07/03/2023)  Housing: Low Risk  (07/09/2023)  Transportation Needs: No Transportation Needs (07/04/2023)  Utilities: Not At Risk (07/03/2023)  Alcohol Screen: Low Risk  (07/13/2021)  Depression (PHQ2-9): Low Risk  (05/11/2022)  Financial Resource Strain: Medium Risk (07/04/2023)  Physical Activity: Insufficiently Active (07/13/2021)  Social Connections: Socially Isolated (07/09/2023)  Stress: No Stress Concern Present (07/13/2021)  Tobacco Use: High Risk (07/07/2023)     Readmission Risk Interventions     No data to display

## 2023-07-10 NOTE — Discharge Summary (Signed)
Physician Discharge Summary   Patient: Terri Burns MRN: [id number] DOB: 02-Aug-1950  Admit date:     07/02/2023  Discharge date: 07/10/23  Discharge Physician: Loyce Dys   PCP: Louis Matte, MD   Recommendations at discharge:  Follow-up with GI  Discharge Diagnoses: Acute hypoxic respiratory failure Acute on chronic HFpEF decompensation COPD with mild acute exacerbation Acute on chronic symptomatic anemia, iron deficiency CAD HTN  Hospital Course: [name] is a 73 y.o. female with medical history significant of COPD, chronic HFpEF, CAD status post stenting on Brilinta, iron deficiency anemia, anxiety/depression, GERD, HTN, HLD, presented with worsening of cough wheezing shortness of breath and malaise.  Patient was seen by gastroenterologist.  Underwent EGD that showed healing duodenal ulcer.  Also had colonoscopy with 1 polyp identified as well as diverticulosis with no active bleeding. She received 1 unit of blood transfusion and hemoglobin stabilized with no evidence of ongoing bleeding.    Consultants: Gastroenterologist Procedures performed: EGD/colonoscopy Disposition: Home health Diet recommendation:  Cardiac diet DISCHARGE MEDICATION: Allergies as of 07/10/2023       Reactions   Levofloxacin Shortness Of Breath   Penicillins Hives   itching   Meloxicam    dizziness   Nsaids Other (See Comments)   Patient states she can tolerate up to three doses per day without incident        Medication List     STOP taking these medications    amLODipine 10 MG tablet Commonly known as: NORVASC   isosorbide mononitrate 30 MG 24 hr tablet Commonly known as: IMDUR       TAKE these medications    Accu-Chek Guide test strip Generic drug: glucose blood USE AS DIRECTED TO Check blood glucose   Accu-Chek Softclix Lancets lancets as directed.   albuterol (2.5 MG/3ML) 0.083% nebulizer solution Commonly known as: PROVENTIL Take 3 mLs (2.5 mg  total) by nebulization 4 (four) times daily.   albuterol 108 (90 Base) MCG/ACT inhaler Commonly known as: Ventolin HFA Inhale 2 puffs into the lungs every 4 (four) hours as needed for wheezing or shortness of breath.   aspirin EC 81 MG tablet TAKE 1 TABLET BY MOUTH EVERY DAY   atorvastatin 40 MG tablet Commonly known as: LIPITOR Take 2 tablets (80 mg total) by mouth daily.   Brilinta 90 MG Tabs tablet Generic drug: ticagrelor TAKE ONE TABLET BY MOUTH EVERY MORNING and TAKE ONE TABLET BY MOUTH EVERYDAY AT BEDTIME   citalopram 20 MG tablet Commonly known as: CELEXA TAKE 1 AND 1/2 TABLETS BY MOUTH EVERY EVENING   cyanocobalamin 1000 MCG tablet Take 1 tablet (1,000 mcg total) by mouth daily.   ferrous sulfate 325 (65 FE) MG tablet Take 1 tablet (325 mg total) by mouth daily before breakfast. Take on an empty stomach with one cup of juice, preferably orange juice   fluticasone 50 MCG/ACT nasal spray Commonly known as: FLONASE PLACE TWO SPRAYS into BOTH nostrils DAILY What changed:  how much to take how to take this when to take this reasons to take this   HYDROcodone-acetaminophen 10-325 MG tablet Commonly known as: NORCO Take 1 tablet by mouth 4 (four) times daily as needed.   ipratropium-albuterol 0.5-2.5 (3) MG/3ML Soln Commonly known as: DUONEB Take 3 mLs by nebulization every 6 (six) hours as needed.   loratadine 10 MG tablet Commonly known as: Allergy Relief Take 1 tablet (10 mg total) by mouth daily.   losartan 25 MG tablet Commonly known as: COZAAR  Take 1 tablet (25 mg total) by mouth every evening. What changed:  medication strength how much to take   metoprolol succinate 25 MG 24 hr tablet Commonly known as: TOPROL-XL Take 0.5 tablets (12.5 mg total) by mouth every evening. What changed: how much to take   Narcan 4 MG/0.1ML Liqd nasal spray kit Generic drug: naloxone 1 spray as needed. As needed in case of overdose   pantoprazole 40 MG  tablet Commonly known as: PROTONIX Take 1 tablet (40 mg total) by mouth 2 (two) times daily. What changed: when to take this   Trelegy Ellipta 100-62.5-25 MCG/ACT Aepb Generic drug: Fluticasone-Umeclidin-Vilant Inhale 1 puff into the lungs daily.               Durable Medical Equipment  (From admission, onward)           Start     Ordered   07/10/23 1143  For home use only DME oxygen  Once       Comments: Patient underwent a walk test Oxygen saturation at rest greater than 92 After making 4 lapses of walking in the hallway patient's oxygen dropped to 88% She required 2 L to maintain it above 88% Patient meets criteria for home oxygen with exertion  Question Answer Comment  Length of Need Lifetime   Mode or (Route) Nasal cannula   Liters per Minute 2   Frequency Continuous (stationary and portable oxygen unit needed)   Oxygen conserving device No   Oxygen delivery system Gas      07/10/23 1146            Discharge Exam: Filed Weights   07/08/23 0448 07/09/23 0500 07/10/23 0500  Weight: 54.8 kg 54.6 kg 55.3 kg   General exam: awake, alert, no acute distress, chronically ill appearing HEENT: moist mucus membranes, hearing grossly normal  Respiratory system: improved aeration, no active wheezing, no rhonchi, normal respiratory effort at rest on 2 L/min [place] o2. Cardiovascular system: normal S1/S2, RRR, no pedal edema.   Gastrointestinal system: soft, NT, ND Central nervous system: A&O x 3. no gross focal neurologic deficits, normal speech Skin: dry, intact, normal temperature Psychiatry: normal mood, flat affect  Condition at discharge: good  The results of significant diagnostics from this hospitalization (including imaging, microbiology, ancillary and laboratory) are listed below for reference.   Imaging Studies: ECHOCARDIOGRAM COMPLETE Result Date: 07/02/2023    ECHOCARDIOGRAM REPORT   Patient Name:   Terri Burns Date of Exam: 07/02/2023 Medical Rec  #:  [id number]      Height:       58.0 in Accession #:    [id number]     Weight:       121.0 lb Date of Birth:  May 28, 1951      BSA:          1.471 m Patient Age:    72 years       BP:           93/64 mmHg Patient Gender: F              HR:           97 bpm. Exam Location:  [facility] Procedure: 2D Echo, Cardiac Doppler and Color Doppler Indications:     CHF-Acute Diastolic I50.31  History:         Patient has prior history of Echocardiogram examinations. Risk                  Factors:Hypertension.  Sonographer:     Neysa Bonito Roar Referring Phys:  [id number] Emeline General Diagnosing Phys: Chilton Si MD  Sonographer Comments: Technically difficult study due to poor echo windows and echo performed with patient supine and on artificial respirator. IMPRESSIONS  1. Left ventricular ejection fraction, by estimation, is 60 to 65%. The left ventricle has normal function. The left ventricle has no regional wall motion abnormalities. Left ventricular diastolic parameters are consistent with Grade I diastolic dysfunction (impaired relaxation).  2. Right ventricular systolic function is normal. The right ventricular size is normal.  3. The mitral valve is normal in structure. No evidence of mitral valve regurgitation. No evidence of mitral stenosis.  4. The aortic valve is normal in structure. Aortic valve regurgitation is not visualized. No aortic stenosis is present. FINDINGS  Left Ventricle: Left ventricular ejection fraction, by estimation, is 60 to 65%. The left ventricle has normal function. The left ventricle has no regional wall motion abnormalities. The left ventricular internal cavity size was normal in size. There is  no left ventricular hypertrophy. Left ventricular diastolic parameters are consistent with Grade I diastolic dysfunction (impaired relaxation). Right Ventricle: The right ventricular size is normal. No increase in right ventricular wall thickness. Right ventricular systolic function is normal. Left Atrium:  Left atrial size was normal in size. Right Atrium: Right atrial size was normal in size. Pericardium: There is no evidence of pericardial effusion. Mitral Valve: The mitral valve is normal in structure. No evidence of mitral valve regurgitation. No evidence of mitral valve stenosis. MV peak gradient, 6.7 mmHg. The mean mitral valve gradient is 2.0 mmHg. Tricuspid Valve: The tricuspid valve is normal in structure. Tricuspid valve regurgitation is not demonstrated. No evidence of tricuspid stenosis. Aortic Valve: The aortic valve is normal in structure. Aortic valve regurgitation is not visualized. No aortic stenosis is present. Aortic valve mean gradient measures 5.0 mmHg. Aortic valve peak gradient measures 8.8 mmHg. Aortic valve area, by VTI measures 2.86 cm. Pulmonic Valve: The pulmonic valve was normal in structure. Pulmonic valve regurgitation is not visualized. No evidence of pulmonic stenosis. Aorta: The aortic root is normal in size and structure. Venous: The inferior vena cava was not well visualized. IAS/Shunts: No atrial level shunt detected by color flow Doppler.  LEFT VENTRICLE PLAX 2D LVIDd:         3.50 cm   Diastology LVIDs:         2.40 cm   LV e' medial:    9.25 cm/s LV PW:         0.80 cm   LV E/e' medial:  6.5 LV IVS:        0.90 cm   LV e' lateral:   7.51 cm/s LVOT diam:     1.70 cm   LV E/e' lateral: 8.0 LV SV:         65 LV SV Index:   44 LVOT Area:     2.27 cm  RIGHT VENTRICLE RV Basal diam:  3.00 cm RV Mid diam:    2.50 cm RV S prime:     16.90 cm/s TAPSE (M-mode): 1.9 cm LEFT ATRIUM             Index        RIGHT ATRIUM          Index LA diam:        3.20 cm 2.18 cm/m   RA Area:     9.79 cm LA Vol (A2C):   41.1 ml 27.94  ml/m  RA Volume:   18.60 ml 12.64 ml/m LA Vol (A4C):   28.1 ml 19.10 ml/m LA Biplane Vol: 37.2 ml 25.29 ml/m  AORTIC VALVE                     PULMONIC VALVE AV Area (Vmax):    2.13 cm      PV Vmax:        1.41 m/s AV Area (Vmean):   1.82 cm      PV Peak grad:   8.0  mmHg AV Area (VTI):     2.86 cm      RVOT Peak grad: 4 mmHg AV Vmax:           148.00 cm/s AV Vmean:          103.000 cm/s AV VTI:            0.227 m AV Peak Grad:      8.8 mmHg AV Mean Grad:      5.0 mmHg LVOT Vmax:         139.00 cm/s LVOT Vmean:        82.600 cm/s LVOT VTI:          0.286 m LVOT/AV VTI ratio: 1.26  AORTA Ao Root diam: 2.70 cm Ao Asc diam:  2.80 cm MITRAL VALVE MV Area (PHT): 5.93 cm     SHUNTS MV Area VTI:   3.06 cm     Systemic VTI:  0.29 m MV Peak grad:  6.7 mmHg     Systemic Diam: 1.70 cm MV Mean grad:  2.0 mmHg MV Vmax:       1.29 m/s MV Vmean:      69.2 cm/s MV Decel Time: 128 msec MV E velocity: 60.00 cm/s MV A velocity: 102.00 cm/s MV E/A ratio:  0.59 MV A Prime:    13.3 cm/s Chilton Si MD Electronically signed by Chilton Si MD Signature Date/Time: 07/02/2023/1:38:47 PM    Final    DG Chest Portable 1 View Result Date: 07/02/2023 CLINICAL DATA:  Shortness of breath. EXAM: PORTABLE CHEST 1 VIEW COMPARISON:  06/16/2023 FINDINGS: Cardiopericardial silhouette is at upper limits of normal for size. Interstitial markings are diffusely coarsened with chronic features. Patchy/nodular ill-defined airspace opacities described previously in the left lung persist. There is some minimal atelectasis at the right base. Probable small bilateral pleural effusions. Bones are diffusely demineralized. Telemetry leads overlie the chest. IMPRESSION: 1. Patchy/nodular ill-defined airspace opacities described previously in the left lung persist. 2. Probable small bilateral pleural effusions. Electronically Signed   By: Kennith Center M.D.   On: 07/02/2023 10:42   DG Chest Port 1 View Result Date: 06/16/2023 CLINICAL DATA:  Hypoxia.  Cough. EXAM: PORTABLE CHEST 1 VIEW COMPARISON:  Chest radiograph dated 06/08/2023 and CT dated 06/08/2023. FINDINGS: Background of emphysema. Faint scattered reticular densities in the left lung, likely scarring. No consolidative changes. There is no pleural  effusion or pneumothorax. The cardiac silhouette is within normal limits. Atherosclerotic calcification of the aorta. No acute osseous pathology. IMPRESSION: 1. No active disease. 2. Emphysema. Electronically Signed   By: Elgie Collard M.D.   On: 06/16/2023 15:29    Microbiology: Results for orders placed or performed during the hospital encounter of 07/02/23  Resp panel by RT-PCR (RSV, Flu A&B, Covid) Anterior Nasal Swab     Status: None   Collection Time: 07/02/23 10:17 AM   Specimen: Anterior Nasal Swab  Result Value Ref Range Status  SARS Coronavirus 2 by RT PCR NEGATIVE NEGATIVE Final    Comment: (NOTE) SARS-CoV-2 target nucleic acids are NOT DETECTED.  The SARS-CoV-2 RNA is generally detectable in upper respiratory specimens during the acute phase of infection. The lowest concentration of SARS-CoV-2 viral copies this assay can detect is 138 copies/mL. A negative result does not preclude SARS-Cov-2 infection and should not be used as the sole basis for treatment or other patient management decisions. A negative result may occur with  improper specimen collection/handling, submission of specimen other than nasopharyngeal swab, presence of viral mutation(s) within the areas targeted by this assay, and inadequate number of viral copies(<138 copies/mL). A negative result must be combined with clinical observations, patient history, and epidemiological information. The expected result is Negative.  Fact Sheet for Patients:  BloggerCourse.com  Fact Sheet for Healthcare Providers:  SeriousBroker.it  This test is no t yet approved or cleared by the [place] FDA and  has been authorized for detection and/or diagnosis of SARS-CoV-2 by FDA under an Emergency Use Authorization (EUA). This EUA will remain  in effect (meaning this test can be used) for the duration of the COVID-19 declaration under Section 564(b)(1) of the Act,  21 U.S.C.section 360bbb-3(b)(1), unless the authorization is terminated  or revoked sooner.       Influenza A by PCR NEGATIVE NEGATIVE Final   Influenza B by PCR NEGATIVE NEGATIVE Final    Comment: (NOTE) The Xpert Xpress SARS-CoV-2/FLU/RSV plus assay is intended as an aid in the diagnosis of influenza from Nasopharyngeal swab specimens and should not be used as a sole basis for treatment. Nasal washings and aspirates are unacceptable for Xpert Xpress SARS-CoV-2/FLU/RSV testing.  Fact Sheet for Patients: BloggerCourse.com  Fact Sheet for Healthcare Providers: SeriousBroker.it  This test is not yet approved or cleared by the [place] FDA and has been authorized for detection and/or diagnosis of SARS-CoV-2 by FDA under an Emergency Use Authorization (EUA). This EUA will remain in effect (meaning this test can be used) for the duration of the COVID-19 declaration under Section 564(b)(1) of the Act, 21 U.S.C. section 360bbb-3(b)(1), unless the authorization is terminated or revoked.     Resp Syncytial Virus by PCR NEGATIVE NEGATIVE Final    Comment: (NOTE) Fact Sheet for Patients: BloggerCourse.com  Fact Sheet for Healthcare Providers: SeriousBroker.it  This test is not yet approved or cleared by the [place] FDA and has been authorized for detection and/or diagnosis of SARS-CoV-2 by FDA under an Emergency Use Authorization (EUA). This EUA will remain in effect (meaning this test can be used) for the duration of the COVID-19 declaration under Section 564(b)(1) of the Act, 21 U.S.C. section 360bbb-3(b)(1), unless the authorization is terminated or revoked.  Performed at [facility], [address] [place], [place] [address]   Expectorated Sputum Assessment w Gram Stain, Rflx to Resp Cult     Status: None   Collection Time: 07/02/23 11:55 PM   Specimen:  Sputum  Result Value Ref Range Status   Specimen Description SPUTUM  Final   Special Requests NONE  Final   Sputum evaluation   Final    THIS SPECIMEN IS ACCEPTABLE FOR SPUTUM CULTURE Performed at [facility], [address] [place], [place] [address]    Report Status 07/03/2023 FINAL  Final  Culture, Respiratory w Gram Stain     Status: None   Collection Time: 07/02/23 11:55 PM   Specimen: SPU  Result Value Ref Range Status   Specimen Description  Final    SPUTUM Performed at [facility], [address] [place], [place] [address]    Special Requests   Final    NONE Reflexed from [id number] Performed at [facility], [address] [place], [place] [address]    Gram Stain   Final    RARE WBC PRESENT, PREDOMINANTLY PMN FEW GRAM POSITIVE COCCI Performed at [facility], [address], [place], [place] [address]    Culture ABUNDANT PSEUDOMONAS AERUGINOSA  Final   Report Status 07/05/2023 FINAL  Final   Organism ID, Bacteria PSEUDOMONAS AERUGINOSA  Final      Susceptibility   Pseudomonas aeruginosa - MIC*    CEFTAZIDIME 2 SENSITIVE Sensitive     CIPROFLOXACIN <=0.25 SENSITIVE Sensitive     GENTAMICIN 8 INTERMEDIATE Intermediate     IMIPENEM <=0.25 SENSITIVE Sensitive     PIP/TAZO <=4 SENSITIVE Sensitive ug/mL    * ABUNDANT PSEUDOMONAS AERUGINOSA    Labs: CBC: Recent Labs  Lab 07/06/23 0523 07/07/23 0505 07/08/23 0414 07/09/23 0523 07/10/23 0258  WBC 4.2 4.7 5.9 4.4 4.2  NEUTROABS 2.6 3.1 4.1 3.1 2.3  HGB 6.9* 8.7* 8.9* 9.1* 9.1*  HCT 21.5* 26.7* 27.6* 28.1* 28.6*  MCV 91.1 90.2 91.1 90.1 91.7  PLT 113* 121* 138* 142* 168   Basic Metabolic Panel: Recent Labs  Lab 07/06/23 0523 07/07/23 0505 07/08/23 0414 07/09/23 0523 07/10/23 0258  NA 139 139 139 140 138  K 4.0 4.0 3.8 3.9 4.0  CL 108 105 104 103 104  CO2 24 27 27 27 28   GLUCOSE 77 79 97 86 135*  BUN <5* <5* 9 8 11   CREATININE 0.49 0.47 0.52 0.53 0.68  CALCIUM  7.6* 8.0* 8.1* 8.0* 8.0*   Liver Function Tests: No results for input(s): "AST", "ALT", "ALKPHOS", "BILITOT", "PROT", "ALBUMIN" in the last 168 hours. CBG: Recent Labs  Lab 07/07/23 0805  GLUCAP 88    Discharge time spent:  35 minutes.  Signed: Loyce Dys, MD [facility] 07/10/2023

## 2023-07-10 NOTE — Plan of Care (Signed)
  Problem: Education: Goal: Knowledge of General Education information will improve Description: Including pain rating scale, medication(s)/side effects and non-pharmacologic comfort measures Outcome: Progressing   Problem: Health Behavior/Discharge Planning: Goal: Ability to manage health-related needs will improve Outcome: Progressing   Problem: Activity: Goal: Risk for activity intolerance will decrease Outcome: Progressing   Problem: Coping: Goal: Level of anxiety will decrease Outcome: Progressing   Problem: Elimination: Goal: Will not experience complications related to bowel motility Outcome: Progressing   Problem: Pain Managment: Goal: General experience of comfort will improve and/or be controlled Outcome: Progressing   Problem: Safety: Goal: Ability to remain free from injury will improve Outcome: Progressing   Problem: Skin Integrity: Goal: Risk for impaired skin integrity will decrease Outcome: Progressing

## 2023-07-13 LAB — ABO/RH: ABO/RH(D): A POS

## 2023-07-15 ENCOUNTER — Other Ambulatory Visit: Payer: Self-pay

## 2023-07-15 ENCOUNTER — Emergency Department: Payer: 59

## 2023-07-15 ENCOUNTER — Inpatient Hospital Stay
Admission: EM | Admit: 2023-07-15 | Discharge: 2023-07-27 | DRG: 291 | Disposition: A | Discharge status: Skilled Nursing Facility | Payer: 59 | Attending: Internal Medicine | Admitting: Internal Medicine

## 2023-07-15 DIAGNOSIS — Z66 Do not resuscitate: Secondary | ICD-10-CM | POA: Diagnosis present

## 2023-07-15 DIAGNOSIS — Z7902 Long term (current) use of antithrombotics/antiplatelets: Secondary | ICD-10-CM

## 2023-07-15 DIAGNOSIS — Z79899 Other long term (current) drug therapy: Secondary | ICD-10-CM

## 2023-07-15 DIAGNOSIS — I700 Atherosclerosis of aorta: Secondary | ICD-10-CM | POA: Diagnosis present

## 2023-07-15 DIAGNOSIS — I2489 Other forms of acute ischemic heart disease: Secondary | ICD-10-CM | POA: Diagnosis not present

## 2023-07-15 DIAGNOSIS — I5033 Acute on chronic diastolic (congestive) heart failure: Secondary | ICD-10-CM | POA: Diagnosis present

## 2023-07-15 DIAGNOSIS — Z8249 Family history of ischemic heart disease and other diseases of the circulatory system: Secondary | ICD-10-CM

## 2023-07-15 DIAGNOSIS — Z823 Family history of stroke: Secondary | ICD-10-CM

## 2023-07-15 DIAGNOSIS — F419 Anxiety disorder, unspecified: Secondary | ICD-10-CM | POA: Diagnosis present

## 2023-07-15 DIAGNOSIS — K219 Gastro-esophageal reflux disease without esophagitis: Secondary | ICD-10-CM | POA: Diagnosis present

## 2023-07-15 DIAGNOSIS — J3089 Other allergic rhinitis: Secondary | ICD-10-CM

## 2023-07-15 DIAGNOSIS — R7989 Other specified abnormal findings of blood chemistry: Secondary | ICD-10-CM | POA: Insufficient documentation

## 2023-07-15 DIAGNOSIS — Z7951 Long term (current) use of inhaled steroids: Secondary | ICD-10-CM

## 2023-07-15 DIAGNOSIS — J9621 Acute and chronic respiratory failure with hypoxia: Secondary | ICD-10-CM | POA: Diagnosis present

## 2023-07-15 DIAGNOSIS — Z1152 Encounter for screening for COVID-19: Secondary | ICD-10-CM | POA: Diagnosis not present

## 2023-07-15 DIAGNOSIS — I252 Old myocardial infarction: Secondary | ICD-10-CM

## 2023-07-15 DIAGNOSIS — J432 Centrilobular emphysema: Secondary | ICD-10-CM | POA: Diagnosis present

## 2023-07-15 DIAGNOSIS — J441 Chronic obstructive pulmonary disease with (acute) exacerbation: Secondary | ICD-10-CM | POA: Diagnosis present

## 2023-07-15 DIAGNOSIS — J9601 Acute respiratory failure with hypoxia: Secondary | ICD-10-CM | POA: Diagnosis not present

## 2023-07-15 DIAGNOSIS — E785 Hyperlipidemia, unspecified: Secondary | ICD-10-CM | POA: Diagnosis present

## 2023-07-15 DIAGNOSIS — F17210 Nicotine dependence, cigarettes, uncomplicated: Secondary | ICD-10-CM | POA: Diagnosis present

## 2023-07-15 DIAGNOSIS — J9612 Chronic respiratory failure with hypercapnia: Secondary | ICD-10-CM | POA: Diagnosis not present

## 2023-07-15 DIAGNOSIS — F3342 Major depressive disorder, recurrent, in full remission: Secondary | ICD-10-CM

## 2023-07-15 DIAGNOSIS — Z751 Person awaiting admission to adequate facility elsewhere: Secondary | ICD-10-CM

## 2023-07-15 DIAGNOSIS — Z881 Allergy status to other antibiotic agents status: Secondary | ICD-10-CM

## 2023-07-15 DIAGNOSIS — I959 Hypotension, unspecified: Secondary | ICD-10-CM | POA: Diagnosis not present

## 2023-07-15 DIAGNOSIS — Z716 Tobacco abuse counseling: Secondary | ICD-10-CM

## 2023-07-15 DIAGNOSIS — I110 Hypertensive heart disease with heart failure: Secondary | ICD-10-CM | POA: Diagnosis present

## 2023-07-15 DIAGNOSIS — D692 Other nonthrombocytopenic purpura: Secondary | ICD-10-CM | POA: Diagnosis present

## 2023-07-15 DIAGNOSIS — Z833 Family history of diabetes mellitus: Secondary | ICD-10-CM

## 2023-07-15 DIAGNOSIS — I10 Essential (primary) hypertension: Secondary | ICD-10-CM | POA: Diagnosis present

## 2023-07-15 DIAGNOSIS — F32A Depression, unspecified: Secondary | ICD-10-CM | POA: Diagnosis present

## 2023-07-15 DIAGNOSIS — Z880 Allergy status to penicillin: Secondary | ICD-10-CM

## 2023-07-15 DIAGNOSIS — I2510 Atherosclerotic heart disease of native coronary artery without angina pectoris: Secondary | ICD-10-CM | POA: Diagnosis present

## 2023-07-15 DIAGNOSIS — J9602 Acute respiratory failure with hypercapnia: Secondary | ICD-10-CM | POA: Diagnosis not present

## 2023-07-15 DIAGNOSIS — J9622 Acute and chronic respiratory failure with hypercapnia: Secondary | ICD-10-CM | POA: Diagnosis present

## 2023-07-15 DIAGNOSIS — Z7982 Long term (current) use of aspirin: Secondary | ICD-10-CM

## 2023-07-15 DIAGNOSIS — Z886 Allergy status to analgesic agent status: Secondary | ICD-10-CM

## 2023-07-15 DIAGNOSIS — A419 Sepsis, unspecified organism: Secondary | ICD-10-CM

## 2023-07-15 DIAGNOSIS — R0603 Acute respiratory distress: Secondary | ICD-10-CM | POA: Diagnosis present

## 2023-07-15 DIAGNOSIS — Z85828 Personal history of other malignant neoplasm of skin: Secondary | ICD-10-CM

## 2023-07-15 DIAGNOSIS — Z888 Allergy status to other drugs, medicaments and biological substances status: Secondary | ICD-10-CM

## 2023-07-15 LAB — RESP PANEL BY RT-PCR (RSV, FLU A&B, COVID)  RVPGX2
Influenza A by PCR: NEGATIVE
Influenza B by PCR: NEGATIVE
Resp Syncytial Virus by PCR: NEGATIVE
SARS Coronavirus 2 by RT PCR: NEGATIVE

## 2023-07-15 LAB — COMPREHENSIVE METABOLIC PANEL
ALT: 30 U/L (ref 0–44)
AST: 34 U/L (ref 15–41)
Albumin: 3.1 g/dL — ABNORMAL LOW (ref 3.5–5.0)
Alkaline Phosphatase: 55 U/L (ref 38–126)
Anion gap: 9 (ref 5–15)
BUN: 13 mg/dL (ref 8–23)
CO2: 22 mmol/L (ref 22–32)
Calcium: 8.2 mg/dL — ABNORMAL LOW (ref 8.9–10.3)
Chloride: 110 mmol/L (ref 98–111)
Creatinine, Ser: 0.6 mg/dL (ref 0.44–1.00)
GFR, Estimated: >60 mL/min (ref >60)
Glucose, Bld: 121 mg/dL — ABNORMAL HIGH (ref 70–99)
Potassium: 4.2 mmol/L (ref 3.5–5.1)
Sodium: 141 mmol/L (ref 135–145)
Total Bilirubin: 0.5 mg/dL (ref 0.0–1.2)
Total Protein: 6.1 g/dL — ABNORMAL LOW (ref 6.5–8.1)

## 2023-07-15 LAB — CBC WITH DIFFERENTIAL/PLATELET
Abs Immature Granulocytes: 0.15 10*3/uL — ABNORMAL HIGH (ref 0.00–0.07)
Basophils Absolute: 0.1 10*3/uL (ref 0.0–0.1)
Basophils Relative: 1 %
Eosinophils Absolute: 0.1 10*3/uL (ref 0.0–0.5)
Eosinophils Relative: 1 %
HCT: 34.4 % — ABNORMAL LOW (ref 36.0–46.0)
Hemoglobin: 10.4 g/dL — ABNORMAL LOW (ref 12.0–15.0)
Immature Granulocytes: 1 %
Lymphocytes Relative: 14 %
Lymphs Abs: 2 10*3/uL (ref 0.7–4.0)
MCH: 28.7 pg (ref 26.0–34.0)
MCHC: 30.2 g/dL (ref 30.0–36.0)
MCV: 94.8 fL (ref 80.0–100.0)
Monocytes Absolute: 1 10*3/uL (ref 0.1–1.0)
Monocytes Relative: 7 %
Neutro Abs: 10.4 10*3/uL — ABNORMAL HIGH (ref 1.7–7.7)
Neutrophils Relative %: 76 %
Platelets: 460 10*3/uL — ABNORMAL HIGH (ref 150–400)
RBC: 3.63 MIL/uL — ABNORMAL LOW (ref 3.87–5.11)
RDW: 17.6 % — ABNORMAL HIGH (ref 11.5–15.5)
WBC: 13.7 10*3/uL — ABNORMAL HIGH (ref 4.0–10.5)
nRBC: 0 % (ref 0.0–0.2)

## 2023-07-15 LAB — BLOOD GAS, VENOUS
Acid-base deficit: 4.5 mmol/L — ABNORMAL HIGH (ref 0.0–2.0)
Bicarbonate: 23.9 mmol/L (ref 20.0–28.0)
O2 Saturation: 92.3 %
Patient temperature: 37
pCO2, Ven: 57 mm[Hg] (ref 44–60)
pH, Ven: 7.23 — ABNORMAL LOW (ref 7.25–7.43)
pO2, Ven: 65 mm[Hg] — ABNORMAL HIGH (ref 32–45)

## 2023-07-15 LAB — MRSA NEXT GEN BY PCR, NASAL: MRSA by PCR Next Gen: NOT DETECTED

## 2023-07-15 LAB — TROPONIN I (HIGH SENSITIVITY)
Troponin I (High Sensitivity): 23 ng/L — ABNORMAL HIGH (ref <18)
Troponin I (High Sensitivity): 48 ng/L — ABNORMAL HIGH (ref <18)

## 2023-07-15 LAB — LACTIC ACID, PLASMA: Lactic Acid, Venous: 1.4 mmol/L (ref 0.5–1.9)

## 2023-07-15 LAB — BRAIN NATRIURETIC PEPTIDE: B Natriuretic Peptide: 291.1 pg/mL — ABNORMAL HIGH (ref 0.0–100.0)

## 2023-07-15 LAB — PROCALCITONIN: Procalcitonin: <0.1 ng/mL

## 2023-07-15 MED ORDER — SODIUM CHLORIDE 3 % IN NEBU
4.0000 mL | INHALATION_SOLUTION | Freq: Two times a day (BID) | RESPIRATORY_TRACT | Status: AC
  Administered 2023-07-16 – 2023-07-18 (×5): 4 mL via RESPIRATORY_TRACT
  Filled 2023-07-15 (×6): qty 4

## 2023-07-15 MED ORDER — PREDNISONE 20 MG PO TABS
40.0000 mg | ORAL_TABLET | Freq: Every day | ORAL | Status: AC
  Administered 2023-07-16 – 2023-07-20 (×5): 40 mg via ORAL
  Filled 2023-07-15 (×6): qty 2

## 2023-07-15 MED ORDER — IPRATROPIUM-ALBUTEROL 0.5-2.5 (3) MG/3ML IN SOLN
3.0000 mL | Freq: Four times a day (QID) | RESPIRATORY_TRACT | Status: DC
  Administered 2023-07-15 – 2023-07-17 (×8): 3 mL via RESPIRATORY_TRACT
  Filled 2023-07-15 (×8): qty 3

## 2023-07-15 MED ORDER — FLUTICASONE PROPIONATE 50 MCG/ACT NA SUSP: 2.0000 | Freq: Every day | NASAL | Status: DC | PRN

## 2023-07-15 MED ORDER — ALBUTEROL SULFATE (2.5 MG/3ML) 0.083% IN NEBU
9.0000 mL | INHALATION_SOLUTION | RESPIRATORY_TRACT | Status: AC
  Administered 2023-07-15: 9 mL via RESPIRATORY_TRACT
  Filled 2023-07-15: qty 9

## 2023-07-15 MED ORDER — ALBUTEROL SULFATE (2.5 MG/3ML) 0.083% IN NEBU
2.5000 mg | INHALATION_SOLUTION | RESPIRATORY_TRACT | Status: DC | PRN
  Filled 2023-07-15: qty 3

## 2023-07-15 MED ORDER — HYDROCODONE-ACETAMINOPHEN 10-325 MG PO TABS
1.0000 | ORAL_TABLET | ORAL | Status: DC | PRN
  Administered 2023-07-15 – 2023-07-27 (×46): 1 via ORAL
  Filled 2023-07-15 (×46): qty 1

## 2023-07-15 MED ORDER — HEPARIN SODIUM (PORCINE) 5000 UNIT/ML IJ SOLN
5000.0000 [IU] | Freq: Three times a day (TID) | INTRAMUSCULAR | Status: DC
  Administered 2023-07-15 – 2023-07-27 (×35): 5000 [IU] via SUBCUTANEOUS
  Filled 2023-07-15 (×35): qty 1

## 2023-07-15 MED ORDER — LACTATED RINGERS IV BOLUS (SEPSIS)
1000.0000 mL | Freq: Once | INTRAVENOUS | Status: AC
  Administered 2023-07-15: 1000 mL via INTRAVENOUS

## 2023-07-15 MED ORDER — SODIUM CHLORIDE 0.9 % IV SOLN
2.0000 g | Freq: Two times a day (BID) | INTRAVENOUS | Status: DC
  Administered 2023-07-15 – 2023-07-20 (×10): 2 g via INTRAVENOUS
  Filled 2023-07-15 (×12): qty 12.5

## 2023-07-15 MED ORDER — FERROUS SULFATE 325 (65 FE) MG PO TABS
325.0000 mg | ORAL_TABLET | Freq: Every day | ORAL | Status: DC
  Administered 2023-07-16 – 2023-07-27 (×12): 325 mg via ORAL
  Filled 2023-07-15 (×12): qty 1

## 2023-07-15 MED ORDER — CEFTRIAXONE SODIUM 2 G IJ SOLR: 2.0000 g | INTRAMUSCULAR | Status: DC

## 2023-07-15 MED ORDER — METOPROLOL SUCCINATE ER 25 MG PO TB24
12.5000 mg | ORAL_TABLET | Freq: Every evening | ORAL | Status: DC
  Administered 2023-07-15 – 2023-07-26 (×11): 12.5 mg via ORAL
  Filled 2023-07-15 (×12): qty 1

## 2023-07-15 MED ORDER — ACETAMINOPHEN 325 MG PO TABS: 650.0000 mg | ORAL_TABLET | Freq: Four times a day (QID) | ORAL | Status: AC | PRN

## 2023-07-15 MED ORDER — SODIUM CHLORIDE 0.9 % IV SOLN
500.0000 mg | Freq: Once | INTRAVENOUS | Status: AC
  Administered 2023-07-15: 500 mg via INTRAVENOUS
  Filled 2023-07-15: qty 5

## 2023-07-15 MED ORDER — UMECLIDINIUM BROMIDE 62.5 MCG/ACT IN AEPB
1.0000 | INHALATION_SPRAY | Freq: Every day | RESPIRATORY_TRACT | Status: DC
  Administered 2023-07-15: 1 via RESPIRATORY_TRACT
  Filled 2023-07-15: qty 7

## 2023-07-15 MED ORDER — LOSARTAN POTASSIUM 25 MG PO TABS
25.0000 mg | ORAL_TABLET | Freq: Every evening | ORAL | Status: DC
  Administered 2023-07-15 – 2023-07-24 (×9): 25 mg via ORAL
  Filled 2023-07-15 (×10): qty 1

## 2023-07-15 MED ORDER — IOHEXOL 350 MG/ML SOLN
75.0000 mL | Freq: Once | INTRAVENOUS | Status: AC | PRN
  Administered 2023-07-15: 75 mL via INTRAVENOUS

## 2023-07-15 MED ORDER — SODIUM CHLORIDE 0.9 % IV SOLN
500.0000 mg | INTRAVENOUS | Status: AC
  Administered 2023-07-16 – 2023-07-19 (×4): 500 mg via INTRAVENOUS
  Filled 2023-07-15 (×4): qty 5

## 2023-07-15 MED ORDER — CITALOPRAM HYDROBROMIDE 10 MG PO TABS
30.0000 mg | ORAL_TABLET | Freq: Every day | ORAL | Status: DC
  Administered 2023-07-15 – 2023-07-27 (×13): 30 mg via ORAL
  Filled 2023-07-15 (×13): qty 3

## 2023-07-15 MED ORDER — ACETAMINOPHEN 650 MG RE SUPP: 650.0000 mg | Freq: Four times a day (QID) | RECTAL | Status: AC | PRN

## 2023-07-15 MED ORDER — FLUTICASONE FUROATE-VILANTEROL 100-25 MCG/ACT IN AEPB
1.0000 | INHALATION_SPRAY | Freq: Every day | RESPIRATORY_TRACT | Status: DC
  Administered 2023-07-16 – 2023-07-27 (×12): 1 via RESPIRATORY_TRACT
  Filled 2023-07-15: qty 28

## 2023-07-15 MED ORDER — NICOTINE 21 MG/24HR TD PT24: 21.0000 mg | MEDICATED_PATCH | Freq: Every day | TRANSDERMAL | Status: AC | PRN

## 2023-07-15 MED ORDER — HYDRALAZINE HCL 20 MG/ML IJ SOLN
5.0000 mg | Freq: Four times a day (QID) | INTRAMUSCULAR | Status: AC | PRN
  Administered 2023-07-17: 5 mg via INTRAVENOUS
  Filled 2023-07-15: qty 1

## 2023-07-15 MED ORDER — METHYLPREDNISOLONE SODIUM SUCC 125 MG IJ SOLR
125.0000 mg | Freq: Once | INTRAMUSCULAR | Status: AC
  Administered 2023-07-15: 125 mg via INTRAVENOUS
  Filled 2023-07-15: qty 2

## 2023-07-15 MED ORDER — PANTOPRAZOLE SODIUM 40 MG PO TBEC
40.0000 mg | DELAYED_RELEASE_TABLET | Freq: Two times a day (BID) | ORAL | Status: DC
  Administered 2023-07-15 – 2023-07-27 (×24): 40 mg via ORAL
  Filled 2023-07-15 (×24): qty 1

## 2023-07-15 MED ORDER — VITAMIN B-12 1000 MCG PO TABS
1000.0000 ug | ORAL_TABLET | Freq: Every day | ORAL | Status: DC
  Administered 2023-07-16 – 2023-07-27 (×12): 1000 ug via ORAL
  Filled 2023-07-15 (×12): qty 1

## 2023-07-15 MED ORDER — ONDANSETRON HCL 4 MG PO TABS: 4.0000 mg | ORAL_TABLET | Freq: Four times a day (QID) | ORAL | Status: AC | PRN

## 2023-07-15 MED ORDER — ATORVASTATIN CALCIUM 80 MG PO TABS
80.0000 mg | ORAL_TABLET | Freq: Every day | ORAL | Status: DC
  Administered 2023-07-15 – 2023-07-26 (×12): 80 mg via ORAL
  Filled 2023-07-15 (×12): qty 1

## 2023-07-15 MED ORDER — LACTATED RINGERS IV SOLN: INTRAVENOUS | Status: AC

## 2023-07-15 MED ORDER — FLUTICASONE FUROATE-VILANTEROL 100-25 MCG/ACT IN AEPB
1.0000 | INHALATION_SPRAY | Freq: Every day | RESPIRATORY_TRACT | Status: DC
  Administered 2023-07-15: 1 via RESPIRATORY_TRACT
  Filled 2023-07-15: qty 28

## 2023-07-15 MED ORDER — UMECLIDINIUM BROMIDE 62.5 MCG/ACT IN AEPB
1.0000 | INHALATION_SPRAY | Freq: Every day | RESPIRATORY_TRACT | Status: DC
  Administered 2023-07-16 – 2023-07-27 (×12): 1 via RESPIRATORY_TRACT
  Filled 2023-07-15 (×2): qty 7

## 2023-07-15 MED ORDER — TICAGRELOR 90 MG PO TABS
90.0000 mg | ORAL_TABLET | Freq: Two times a day (BID) | ORAL | Status: DC
  Administered 2023-07-15 – 2023-07-27 (×24): 90 mg via ORAL
  Filled 2023-07-15 (×26): qty 1

## 2023-07-15 MED ORDER — ASPIRIN 81 MG PO TBEC
81.0000 mg | DELAYED_RELEASE_TABLET | Freq: Every day | ORAL | Status: DC
  Administered 2023-07-16 – 2023-07-27 (×12): 81 mg via ORAL
  Filled 2023-07-15 (×12): qty 1

## 2023-07-15 MED ORDER — SENNOSIDES-DOCUSATE SODIUM 8.6-50 MG PO TABS
1.0000 | ORAL_TABLET | Freq: Every evening | ORAL | Status: DC | PRN
  Administered 2023-07-19 – 2023-07-23 (×2): 1 via ORAL
  Filled 2023-07-15 (×2): qty 1

## 2023-07-15 MED ORDER — ONDANSETRON HCL 4 MG/2ML IJ SOLN: 4.0000 mg | Freq: Four times a day (QID) | INTRAMUSCULAR | Status: AC | PRN

## 2023-07-15 MED ORDER — SODIUM CHLORIDE 0.9 % IV SOLN
2.0000 g | Freq: Once | INTRAVENOUS | Status: AC
  Administered 2023-07-15: 2 g via INTRAVENOUS
  Filled 2023-07-15: qty 20

## 2023-07-15 NOTE — ED Notes (Signed)
 Pt got food, assisted with her meals and gave pt fluids. Tolerating well

## 2023-07-15 NOTE — Hospital Course (Signed)
 [name] is a 73 year old female with history of severe COPD, hyperlipidemia, depression, anxiety, hypertension, GERD, who presents emergency department from home via EMS for chief concerns of shortness of breath.  Vitals in the ED showed temperature of 97.8, respiration rate of 28, heart rate 61, blood pressure 137/49, SpO2 with MS was reported as 82-84 percent on room air, patient was placed on CPAP therapy and arrived at to the ED with SpO2 of 100%.  On arrival to the ED, PT switch patient to BiPAP therapy.  Serum Na 141, K 4.2, chloride 110, bicarb 22, BUN 13, sCr 0.60, EGFR greater than 60, nonfasting blood glucose 121, WBC 13.7, hemoglobin 10.4, platelets of 420.  High since he troponin is 23, on repeat was 48.  BNP was 291.1.  Lactic acid was within normal limits at 1.4.  COVID/influenza A/influenza B/RSV PCR were negative.  Blood cultures x 2 have been collected and are in process.  ED treatment: BiPAP therapy, azithromycin  500 mg IV one-time dose, ceftriaxone  2 g IV once, ceftriaxone  500 mg IV one-time dose, albuterol  nebulizer one-time dose, LR 1 L bolus.

## 2023-07-15 NOTE — Assessment & Plan Note (Signed)
-   Treat per above 

## 2023-07-15 NOTE — Assessment & Plan Note (Signed)
 Resumed home aspirin  81 mg daily, home Brilinta  90 mg p.o. twice daily, atorvastatin  80 mg nightly

## 2023-07-15 NOTE — Progress Notes (Signed)
 Pharmacy Antibiotic Note  Terri Burns is a 73 y.o. female admitted on 07/15/2023 with pneumonia. Pt presenting with shortness of breath, wheezing, and hypoxia. PMH significant for COPD. In ED, patient is afebrile with WBC 13.7. Pharmacy has been consulted for cefepime  dosing.  Plan: Start cefepime  2 g IV every 12 hours Monitor renal function, clinical status, culture data, and LOT  Height: 4' 10 (147.3 cm) Weight: 55.3 kg (121 lb 14.6 oz) IBW/kg (Calculated) : 40.9  Temp (24hrs), Avg:98.2 F (36.8 C), Min:97.8 F (36.6 C), Max:98.6 F (37 C)  Recent Labs  Lab 07/09/23 0523 07/10/23 0258 07/15/23 0920 07/15/23 1037  WBC 4.4 4.2 13.7*  --   CREATININE 0.53 0.68 0.60  --   LATICACIDVEN  --   --   --  1.4    Estimated Creatinine Clearance: 47.6 mL/min (by C-G formula based on SCr of 0.6 mg/dL).    Allergies  Allergen Reactions   Levofloxacin Shortness Of Breath   Penicillins Hives    itching   Meloxicam      dizziness   Nsaids Other (See Comments)    Patient states she can tolerate up to three doses per day without incident   Antimicrobials this admission: ceftriaxone  2/8 x1 Cefepime  2/8 >> azithromycin  2/8 >>   Dose adjustments this admission: N/A  Microbiology results: 2/8 BCx: pending 2/8 Sputum: pending  2/8 MRSA PCR: pending  Thank you for involving pharmacy in this patient's care.   Damien Napoleon, PharmD Clinical Pharmacist 07/15/2023 5:41 PM

## 2023-07-15 NOTE — Assessment & Plan Note (Addendum)
 -  As needed nicotine patch ordered ?

## 2023-07-15 NOTE — Assessment & Plan Note (Signed)
 Home citalopram  30 mg daily resumed

## 2023-07-15 NOTE — H&P (Signed)
 History and Physical   Terri Burns FMW:[id number] DOB: [birth year] DOA: 07/15/2023  PCP: Sampson Ethridge LABOR, MD  Outpatient Specialists: Dr. Darron, cardiology Patient coming from: home via EMS  I have personally briefly reviewed patient's old medical records in [facility] EMR.  Chief Concern: shortness of breath  HPI: [name] is a 73 year old female with history of severe COPD, hyperlipidemia, depression, anxiety, hypertension, GERD, who presents emergency department from home via EMS for chief concerns of shortness of breath.  Vitals in the ED showed temperature of 97.8, respiration rate of 28, heart rate 61, blood pressure 137/49, SpO2 with MS was reported as 82-84 percent on room air, patient was placed on CPAP therapy and arrived at to the ED with SpO2 of 100%.  On arrival to the ED, PT switch patient to BiPAP therapy.  Serum Na 141, K 4.2, chloride 110, bicarb 22, BUN 13, sCr 0.60, EGFR greater than 60, nonfasting blood glucose 121, WBC 13.7, hemoglobin 10.4, platelets of 420.  High since he troponin is 23, on repeat was 48.  BNP was 291.1.  Lactic acid was within normal limits at 1.4.  COVID/influenza A/influenza B/RSV PCR were negative.  Blood cultures x 2 have been collected and are in process.  ED treatment: BiPAP therapy, azithromycin  500 mg IV one-time dose, ceftriaxone  2 g IV once, ceftriaxone  500 mg IV one-time dose, albuterol  nebulizer one-time dose, LR 1 L bolus. --------------------------------- At bedside, patient is able to tell me her name, age, current location, current calendar year.    Social history: She lives on her on her own. She currently smokes cigarettes and down to 3 cigarettes. She denies etoh, recreational drug use. She is retired and formerly was a production designer, theatre/television/film for [employer].  ROS: Constitutional: no weight change, no fever ENT/Mouth: no sore throat, no rhinorrhea Eyes: no eye pain, no vision changes Cardiovascular: no chest pain, no  dyspnea,  no edema, no palpitations Respiratory: + cough, + sputum, no wheezing Gastrointestinal: no nausea, no vomiting, no diarrhea, no constipation Genitourinary: no urinary incontinence, no dysuria, no hematuria Musculoskeletal: no arthralgias, no myalgias Skin: no skin lesions, no pruritus, Neuro: + weakness, no loss of consciousness, no syncope Psych: no anxiety, no depression, + decrease appetite Heme/Lymph: no bruising, no bleeding  ED Course: Discussed with EDP, patient requiring hospitalization for chief concerns of severe COPD exacerbation.  Assessment/Plan  Principal Problem:   Acute hypoxic on chronic hypercapnic respiratory failure (HCC) Active Problems:   COPD exacerbation (HCC)   Anxiety and depression   GERD (gastroesophageal reflux disease)   Benign essential HTN   Senile purpura (HCC)   Depression   HLD (hyperlipidemia)   Nicotine  dependence, cigarettes, uncomplicated   Centrilobular emphysema (HCC)   Coronary artery disease involving native coronary artery of native heart without angina pectoris   Elevated troponin   Assessment and Plan:  * Acute hypoxic on chronic hypercapnic respiratory failure (HCC) Suspect secondary to COPD exacerbation, complicated by possible mucous plugging When EMS arrived, patient was not on O2 supplementation TOC has been consulted as patient will need to be sent home with home oxygen supplementation at least during the day and continuously at night Consideration can be given to her status of severe COPD, she would benefit from trilogy/CPAP/NIPPV therapy nightly at home We will consult pulmonology inpatient for consideration of bronchoscopy in setting of mucous plugging and prescription to send patient home with trilogy/CPAP/NIPPV therapy Continue oxygen supplementation to maintain SpO2 greater than 92% Continuous pulse oximetry BiPAP  therapy nightly  COPD exacerbation (HCC) Treat per above  Anxiety and depression Home  citalopram  30 mg daily resumed  Elevated troponin Suspect secondary to acute on chronic hypoxic respiratory failure causing demand ischemia Patient denies chest pain and current shortness of breath at bedside Low clinical suspicion for ACS at this time  Coronary artery disease involving native coronary artery of native heart without angina pectoris Resumed home aspirin  81 mg daily, home Brilinta  90 mg p.o. twice daily, atorvastatin  80 mg nightly  Nicotine  dependence, cigarettes, uncomplicated As needed nicotine  patch ordered  Benign essential HTN Home losartan  25 mg every evening, metoprolol  succinate 12.5 mg every evening resumed on admission Hydralazine  5 mg IV every 6 hours as needed for SBP greater 165, 5 days ordered  Chart reviewed.   DVT prophylaxis: 5000 units subcutaneous every 8 hours Code Status: DNR/DNI, patient does not want chest compression, and does not want a tube stuck down her throat and/or a machine to breath for her. She wants to be let go naturally. Diet: N.p.o. 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    Hyperlipidemia    Hypertension    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   Past Surgical History:  Procedure Laterality Date   BIOPSY  07/07/2023   Procedure: BIOPSY;  Surgeon: Jinny Carmine, MD;  Location: [facility]OS[facility];  Service: Endoscopy;;   COLONOSCOPY WITH PROPOFOL  N/A 07/07/2023   Procedure: COLONOSCOPY WITH PROPOFOL ;  Surgeon: Jinny Carmine, MD;  Location: [facility] ENDOS[facility];  Service: Endoscopy;  Laterality: N/A;    CORONARY STENT INTERVENTION N/A 02/22/2021   Procedure: CORONARY STENT INTERVENTION;  Surgeon: Darron Deatrice LABOR, MD;  Location: [facility];  Service: Cardiovascular;  Laterality: N/A;   ESOPHAGOGASTRODUODENOSCOPY (EGD) WITH PROPOFOL  N/A 07/07/2023   Procedure: ESOPHAGOGASTRODUODENOSCOPY (EGD) WITH PROPOFOL ;  Surgeon: Jinny Carmine, MD;  Location: [facility] END[facility];  Service: Endoscopy;  Laterality: N/A;   LEFT HEART CATH AND CORONARY ANGIOGRAPHY N/A 02/22/2021   Procedure: LEFT HEART CATH AND CORONARY ANGIOGRAPHY;  Surgeon: Darron Deatrice LABOR, MD;  Location: [facility];  Service: Cardiovascular;  Laterality: N/A;   NECK SURGERY N/A 10/2003   POLYPECTOMY  07/07/2023   Procedure: POLYPECTOMY;  Surgeon: Jinny Carmine, MD;  Location: [facility] ENDOSCOPY;  Service: Endoscopy;;   SHOULDER SURGERY Left 2004   Social History:  reports that she has been smoking cigarettes. She started smoking about 49 years ago. She has a 12.5 pack-year smoking history. She has never used smokeless tobacco. She reports that she does not currently use alcohol. She reports that she does not use drugs.  Allergies  Allergen Reactions   Levofloxacin Shortness Of Breath   Penicillins Hives    itching   Meloxicam      dizziness   Nsaids Other (See Comments)    Patient states she can tolerate up to three doses per day without incident   Family History  Problem Relation Age of Onset   Hypertension Mother    Stroke Mother    Heart disease Father    Diabetes Father    Heart disease Daughter    Pulmonary embolism  Daughter    Hypertension Son    Breast cancer Neg Hx    Family history: Family history reviewed and not pertinent.  Prior to Admission medications   Medication Sig Start Date End Date Taking? Authorizing Provider  ACCU-CHEK GUIDE test strip USE AS DIRECTED TO Check blood glucose 03/25/22   [provider]  Accu-Chek Softclix Lancets lancets as directed. 03/25/22   [provider]   albuterol  (PROVENTIL ) (2.5 MG/3ML) 0.083% nebulizer solution Take 3 mLs (2.5 mg total) by nebulization 4 (four) times daily. 01/07/22   Rizwan, Saima, MD  albuterol  (VENTOLIN  HFA) 108 (90 Base) MCG/ACT inhaler Inhale 2 puffs into the lungs every 4 (four) hours as needed for wheezing or shortness of breath. 05/20/23   Shalhoub, Zachary PARAS, MD  aspirin  81 MG EC tablet TAKE 1 TABLET BY MOUTH EVERY DAY 04/22/19   Sowles, Krichna, MD  atorvastatin  (LIPITOR ) 40 MG tablet Take 2 tablets (80 mg total) by mouth daily. 09/07/21   Sowles, Krichna, MD  BRILINTA  90 MG TABS tablet TAKE ONE TABLET BY MOUTH EVERY MORNING and TAKE ONE TABLET BY MOUTH EVERYDAY AT BEDTIME 02/15/22   Darron Deatrice LABOR, MD  citalopram  (CELEXA ) 20 MG tablet TAKE 1 AND 1/2 TABLETS BY MOUTH EVERY EVENING 12/08/21   Sowles, Krichna, MD  cyanocobalamin  1000 MCG tablet Take 1 tablet (1,000 mcg total) by mouth daily. 05/21/23   Shalhoub, Zachary PARAS, MD  ferrous sulfate  325 (65 FE) MG tablet Take 1 tablet (325 mg total) by mouth daily before breakfast. Take on an empty stomach with one cup of juice, preferably orange juice 05/20/23   Shalhoub, Zachary PARAS, MD  fluticasone  (FLONASE ) 50 MCG/ACT nasal spray PLACE TWO SPRAYS into BOTH nostrils DAILY Patient taking differently: Place 2 sprays into both nostrils daily as needed. PLACE TWO SPRAYS into BOTH nostrils DAILY 12/03/21   Sowles, Krichna, MD  Fluticasone -Umeclidin-Vilant (TRELEGY ELLIPTA ) 100-62.5-25 MCG/ACT AEPB Inhale 1 puff into the lungs daily. 05/20/23   Shalhoub, Zachary PARAS, MD  HYDROcodone -acetaminophen  (NORCO) 10-325 MG tablet Take 1 tablet by mouth 4 (four) times daily as needed. 06/01/23   [provider]  ipratropium-albuterol  (DUONEB) 0.5-2.5 (3) MG/3ML SOLN Take 3 mLs by nebulization every 6 (six) hours as needed. 03/11/22   [provider]  loratadine  (ALLERGY RELIEF) 10 MG tablet Take 1 tablet (10 mg total) by mouth daily. 09/07/21   Sowles, Krichna, MD  losartan  (COZAAR ) 25 MG  tablet Take 1 tablet (25 mg total) by mouth every evening. 07/10/23   Dorinda Drue DASEN, MD  metoprolol  succinate (TOPROL -XL) 25 MG 24 hr tablet Take 0.5 tablets (12.5 mg total) by mouth every evening. 07/10/23   Dorinda Drue DASEN, MD  NARCAN  4 MG/0.1ML LIQD nasal spray kit 1 spray as needed. As needed in case of overdose 04/13/21   [provider]  pantoprazole  (PROTONIX ) 40 MG tablet Take 1 tablet (40 mg total) by mouth 2 (two) times daily. 07/10/23   Dorinda Drue DASEN, MD   Physical Exam: Vitals:   07/15/23 1559 07/15/23 1634 07/15/23 1730 07/15/23 1800  BP:  (!) 146/100  (!) 154/93  Pulse:  99 96 99  Resp:   18 (!) 22  Temp: 98.6 F (37 C)     TempSrc: Oral     SpO2:  100% 100% 99%  Weight:      Height:       Constitutional: appears frail, cachectic appearing Eyes: PERRL, lids and conjunctivae normal ENMT: Mucous membranes are moist. Posterior pharynx clear  of any exudate or lesions. Age-appropriate dentition. Hearing appropriate Neck: normal, supple, no masses, no thyromegaly Respiratory: Bilateral crackles, generalized end expiratory wheezing.  Increased respiratory effort. No accessory muscle use.  Cardiovascular: Regular rate and rhythm, no murmurs / rubs / gallops. No extremity edema. 2+ pedal pulses. No carotid bruits.  Abdomen: no tenderness, no masses palpated, no hepatosplenomegaly. Bowel sounds positive.  Musculoskeletal: no clubbing / cyanosis. No joint deformity upper and lower extremities. Good ROM, no contractures, no atrophy. Normal muscle tone.  Skin: no rashes, lesions, ulcers. No induration Neurologic: Sensation intact. Strength 5/5 in all 4.  Psychiatric: Normal judgment and insight. Alert and oriented x 3. Normal mood.   EKG: independently reviewed, showing sinus tachycardia with rate of 103, QTc 453  Chest x-ray on Admission: I personally reviewed and I agree with radiologist reading as below.  CT Angio Chest PE W/Cm &/Or Wo Cm Result Date: 07/15/2023 CLINICAL  DATA:  Shortness of breath. EXAM: CT ANGIOGRAPHY CHEST WITH CONTRAST TECHNIQUE: Multidetector CT imaging of the chest was performed using the standard protocol during bolus administration of intravenous contrast. Multiplanar CT image reconstructions and MIPs were obtained to evaluate the vascular anatomy. RADIATION DOSE REDUCTION: This exam was performed according to the departmental dose-optimization program which includes automated exposure control, adjustment of the mA and/or kV according to patient size and/or use of iterative reconstruction technique. CONTRAST:  75mL OMNIPAQUE  IOHEXOL  350 MG/ML SOLN COMPARISON:  CT chest dated June 08, 2023. FINDINGS: Cardiovascular: Satisfactory opacification of the pulmonary arteries to the segmental level. No evidence of pulmonary embolism. Unchanged borderline cardiomegaly. No pericardial effusion. 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 Similar mild dilatation of the main and left and right pulmonary arteries. Coronary, aortic arch, and branch vessel atherosclerotic vascular disease. Mediastinum/Nodes: Unchanged mildly enlarged right hilar lymph node measuring up to 1.2 cm in short axis. No enlarged mediastinal or axillary lymph nodes. Thyroid gland and esophagus demonstrate no significant findings. Lungs/Pleura: Increased secretions within the trachea and both mainstem bronchi. Similar paraseptal and centrilobular emphysema. Irregular nodule in the superior segment of the right lower lobe has slightly decreased in size, currently measuring 6 x 4 mm, previously 8 x 5 mm. New similar-appearing irregular nodule in the more medial superior segment of the right lower lobe, measuring 6 x 7 mm (series 5, image 62). Similar mucous airways impaction in both lower lobe distal bronchi. Improved peripheral subpleural atelectasis in the right lower lobe. Unchanged bronchiectasis and scarring with volume loss in  the superior segment of the left lower lobe. Similar pleuroparenchymal scarring in the remaining left lower lobe and the lingula. No new consolidation, pleural effusion, or pneumothorax. Upper Abdomen: No acute abnormality. Musculoskeletal: No chest wall abnormality. No acute or significant osseous findings. Review of the MIP images confirms the above findings. IMPRESSION: 1. No evidence of pulmonary embolism. 2. Increased secretions within the trachea and both mainstem bronchi. Similar bronchitic changes in both lower lobes with mucous airways impaction. 3. Irregular nodule in the superior segment of the right lower lobe has slightly decreased in size. New similar-appearing irregular nodule in the more medial superior segment of the right lower lobe, measuring 6 x 7 mm. Findings potentially reflect waxing and waning atypical infection. 4. Aortic Atherosclerosis (ICD10-I70.0) and Emphysema (ICD10-J43.9). Electronically Signed   By: Elsie ONEIDA Shoulder M.D.   On: 07/15/2023 14:37   DG Chest Port 1 View Result Date: 07/15/2023 CLINICAL DATA:  Shortness of breath. EXAM:  PORTABLE CHEST 1 VIEW COMPARISON:  07/02/2023 FINDINGS: The cardiopericardial silhouette is within normal limits for size. Scattered ill-defined parenchymal opacities bilaterally are similar to prior and remain compatible with areas of consolidative airspace disease and/or architectural distortion seen on CT 06/08/2023. No substantial pleural effusion. No pulmonary edema. No acute bony abnormality. Telemetry leads overlie the chest. IMPRESSION: Scattered ill-defined parenchymal opacities bilaterally are similar to prior and remain compatible with areas of consolidative airspace disease and/or architectural distortion seen on CT 06/08/2023. Electronically Signed   By: Camellia Candle M.D.   On: 07/15/2023 09:44   Labs on Admission: I have personally reviewed following labs  CBC: Recent Labs  Lab 07/09/23 0523 07/10/23 0258 07/15/23 0920  WBC 4.4  4.2 13.7*  NEUTROABS 3.1 2.3 10.4*  HGB 9.1* 9.1* 10.4*  HCT 28.1* 28.6* 34.4*  MCV 90.1 91.7 94.8  PLT 142* 168 460*   Basic Metabolic Panel: Recent Labs  Lab 07/09/23 0523 07/10/23 0258 07/15/23 0920  NA 140 138 141  K 3.9 4.0 4.2  CL 103 104 110  CO2 27 28 22   GLUCOSE 86 135* 121*  BUN 8 11 13   CREATININE 0.53 0.68 0.60  CALCIUM  8.0* 8.0* 8.2*   GFR: Estimated Creatinine Clearance: 47.6 mL/min (by C-G formula based on SCr of 0.6 mg/dL).  Liver Function Tests: Recent Labs  Lab 07/15/23 0920  AST 34  ALT 30  ALKPHOS 55  BILITOT 0.5  PROT 6.1*  ALBUMIN 3.1*   Urine analysis:    Component Value Date/Time   COLORURINE STRAW (A) 02/02/2021 1507   APPEARANCEUR CLEAR (A) 02/02/2021 1507   LABSPEC 1.010 02/02/2021 1507   PHURINE 6.0 02/02/2021 1507   GLUCOSEU NEGATIVE 02/02/2021 1507   HGBUR SMALL (A) 02/02/2021 1507   BILIRUBINUR NEGATIVE 02/02/2021 1507   KETONESUR NEGATIVE 02/02/2021 1507   PROTEINUR NEGATIVE 02/02/2021 1507   NITRITE NEGATIVE 02/02/2021 1507   LEUKOCYTESUR NEGATIVE 02/02/2021 1507   This document was prepared using Dragon Voice Recognition software and may include unintentional dictation errors.  Dr. Sherre [facility]  If 7PM-7AM, please contact overnight-coverage provider If 7AM-7PM, please contact day attending provider [email or web address]  07/15/2023, 7:10 PM

## 2023-07-15 NOTE — Assessment & Plan Note (Addendum)
 Suspect secondary to COPD exacerbation, complicated by possible mucous plugging When EMS arrived, patient was not on O2 supplementation TOC has been consulted as patient will need to be sent home with home oxygen supplementation at least during the day and continuously at night Consideration can be given to her status of severe COPD, she would benefit from trilogy/CPAP/NIPPV therapy nightly at home We will consult pulmonology inpatient for consideration of bronchoscopy in setting of mucous plugging and prescription to send patient home with trilogy/CPAP/NIPPV therapy Continue oxygen supplementation to maintain SpO2 greater than 92% Continuous pulse oximetry BiPAP therapy nightly

## 2023-07-15 NOTE — ED Notes (Signed)
 Oxygen was added to pt for comfort.  She is on 2L [place] now. She has a productive cough--Dr Cox has seen pt

## 2023-07-15 NOTE — Sepsis Progress Note (Signed)
 Elink following code sepsis

## 2023-07-15 NOTE — Progress Notes (Signed)
 Pt transported to and from CT on BiPAP with RN without incident.   BiPAP mask removed from pt post CT trip. Tolerating RA well. Inhalers given. Call light and remote for TV given to pt.

## 2023-07-15 NOTE — Assessment & Plan Note (Signed)
 Suspect secondary to acute on chronic hypoxic respiratory failure causing demand ischemia Patient denies chest pain and current shortness of breath at bedside Low clinical suspicion for ACS at this time

## 2023-07-15 NOTE — Consult Note (Signed)
 CODE SEPSIS - PHARMACY COMMUNICATION  **Broad Spectrum Antibiotics should be administered within 1 hour of Sepsis diagnosis**  Time Code Sepsis Called/Page Received: 1006  Antibiotics Ordered: azithromycin  and ceftriaxone   Time of 1st antibiotic administration: 1045  Additional action taken by pharmacy: none  If necessary, Name of Provider/Nurse Contacted: n/a   Terri Burns PharmD, BCPS 07/15/2023 10:49 AM

## 2023-07-15 NOTE — Assessment & Plan Note (Signed)
 Home losartan  25 mg every evening, metoprolol  succinate 12.5 mg every evening resumed on admission Hydralazine  5 mg IV every 6 hours as needed for SBP greater 165, 5 days ordered

## 2023-07-15 NOTE — Consult Note (Addendum)
 NAME:  Terri Burns, MRN:  [id number], DOB:  06/22/1950, LOS: 0 ADMISSION DATE:  07/15/2023, CONSULTATION DATE:  07/15/2023 REFERRING MD:  Greig Free, DO, CHIEF COMPLAINT: Shortness of Breath  History of Present Illness:   Patient is a pleasant 73 year old female with a past medical history of COPD previously followed by Dr. Darlean who presents to the hospital with increased shortness of breath and hypoxia.  Patient was recently admitted to the hospital on 07/02/2023 and discharged on 07/10/2023 for hypoxic respiratory failure secondary to COPD exacerbation.  She had presented with cough, wheeze, shortness of breath, and productive sputum.  While in the hospital, she was noted to have an upper GI bleed with EGD showing a duodenal ulcer.  Symptoms felt to be secondary to concomitant decompensated heart failure as well as mild COPD exacerbation.  She was to be discharged on oxygen but reports that this was not delivered.  Review of the medical record is also notable for admission for COPD exacerbation early December 2024 and early January 2025.  Upon discharge home, patient's symptoms worsened and she felt extremely short of breath with any movement.  She was having cough productive of sputum but this is not increased compared to prior.  Cough is productive of cream-colored sputum. Patient denies hemoptysis.  She reports a wheeze as well as chest tightness.  She reports history of COPD for many years now and was previously seen by Dr. Darlean, last seen in 2022.  No PFTs noted in the chart.  She was previously maintained on Trelegy which was last dispensed on 06/22/2023 but she does not appear to have been taking it prior to that.  She reports a longstanding history of smoking, having started at the age of 60 and quit a couple years ago.  She has around 50 to 55 pack years of smoking history.  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  Denies any work in set designer or other  occupational exposures.  Pertinent  Medical History   -CAD (NSTEMI 2022 with PCI to proximal LAD) -HFpEF -HTN -HLD -Presumed COPD   Objective   Blood pressure (!) 146/100, pulse 99, temperature 98.6 F (37 C), temperature source Oral, resp. rate (!) 22, height 4' 10 (1.473 m), weight 55.3 kg, SpO2 100%.    FiO2 (%):  [21 %] 21 %  No intake or output data in the 24 hours ending 07/15/23 1636 Filed Weights   07/15/23 0912  Weight: 55.3 kg    Examination: Physical Exam Constitutional:      General: She is not in acute distress.    Appearance: She is ill-appearing.  Cardiovascular:     Rate and Rhythm: Normal rate and regular rhythm.  Pulmonary:     Breath sounds: Wheezing present.     Comments: Decreased air entry bilaterally. Wheeze noted on anterior auscultation Musculoskeletal:     Right lower leg: No edema.     Left lower leg: No edema.  Neurological:     General: No focal deficit present.     Mental Status: She is alert. Mental status is at baseline.     Assessment & Plan:   #Acute hypoxic and hypercapnic respiratory failure #Presumed COPD exacerbation #Chronic bronchitis #CAD #HFpEF  Presents with increased shortness of breath, cough productive of sputum, and wheeze with concomitant hypoxia and hypercapnia consistent with acute hypoxic and hypercapnic respiratory failure secondary to presumed COPD exacerbation. Imaging was independently reviewed by me and notable for severe  emphysema as well as airway thickening with secretions pooling in the airways. She likely has a chronic bronchitis phenotype given her reported symptoms. That said, she's not had PFT's to confirm this diagnosis.  She was previously prescribed triple therapy with Trelegy but was lost to follow-up in clinic.  She does come in with hypoxic and hypercapnic respiratory failure and was initiated on NIPPV. Given recurrence, and previous blood gas showing hypercapnia, I would recommend continuing her  on NIPPV nocturnally on discharge. She might require an ABG to qualify as she's only had venous blood gases. She will likely also require oxygen on discharge (at rest and with exertion) given significant hypoxia.  For inpatient management, I would recommend initiating the patient on broad-spectrum antibiotics given findings of patchy opacities on imaging as well as copious secretions. Would add Cefepime  to Azithromycin  given recurrent admission, previous pseudomonas isolation in sputum, and high risk for resistant organisms (cultures, MRSA screen, viral panel ordered). Given she's had recurrent symptoms over the past few months, with 3 admissions and hospitalizations, I would recommend discharge on thrice weekly Azithromycin  (250 mg Monday/Wednesday/Friday).   While inpatient, would initiate short course of prednisone  (40 mg daily x 5 days) in addition to standing SABA/SAMA nebulizers (duo-nebs). She will benefit from aggressive pulmonary toilet to help clear her secretions (noted on imaging and by symptoms). I will add a flutter device, incentive spirometer, manual chest percussion, and hypertonic saline nebs. Finally, would recommend discharge on standing triple therapy inhalers (would prefer HFA device such as Breztri + spacer device, Stiolto Respimat+ Flovent , or budesonide  plus formoterol  plus revefenacin  nebulized).  Recommendations:  -duonebs every 4 hours -prednisone  40 mg daily for 5 days -hypertonic saline nebs twice daily -flutter device, incentive spirometer, pulmonary toilet -Cefepime /Azithromycin  for pneumonia coverage -respiratory cultures and viral panel ordered, MRSA screen -consider thrice weekly azithromycin  on discharge -triple therapy inhaler on discharge, recommend HFA or respimat device vs nebulizer -oxygen and NIPPV on discharge -outpatient follow up with pulmonary  Belva November, MD [facility] 07/15/2023 5:28 PM   Labs   CBC: Recent Labs  Lab  07/09/23 0523 07/10/23 0258 07/15/23 0920  WBC 4.4 4.2 13.7*  NEUTROABS 3.1 2.3 10.4*  HGB 9.1* 9.1* 10.4*  HCT 28.1* 28.6* 34.4*  MCV 90.1 91.7 94.8  PLT 142* 168 460*    Basic Metabolic Panel: Recent Labs  Lab 07/09/23 0523 07/10/23 0258 07/15/23 0920  NA 140 138 141  K 3.9 4.0 4.2  CL 103 104 110  CO2 27 28 22   GLUCOSE 86 135* 121*  BUN 8 11 13   CREATININE 0.53 0.68 0.60  CALCIUM  8.0* 8.0* 8.2*   GFR: Estimated Creatinine Clearance: 47.6 mL/min (by C-G formula based on SCr of 0.6 mg/dL). Recent Labs  Lab 07/09/23 0523 07/10/23 0258 07/15/23 0920 07/15/23 1037  WBC 4.4 4.2 13.7*  --   LATICACIDVEN  --   --   --  1.4    Liver Function Tests: Recent Labs  Lab 07/15/23 0920  AST 34  ALT 30  ALKPHOS 55  BILITOT 0.5  PROT 6.1*  ALBUMIN 3.1*   No results for input(s): LIPASE, AMYLASE in the last 168 hours. No results for input(s): AMMONIA in the last 168 hours.  ABG    Component Value Date/Time   PHART 7.36 02/02/2021 0731   PCO2ART 44 02/02/2021 0731   PO2ART 101 02/02/2021 0731   HCO3 23.9 07/15/2023 0911   ACIDBASEDEF 4.5 (H) 07/15/2023 0911   O2SAT 92.3 07/15/2023 0911  Coagulation Profile: No results for input(s): INR, PROTIME in the last 168 hours.  Cardiac Enzymes: No results for input(s): CKTOTAL, CKMB, CKMBINDEX, TROPONINI in the last 168 hours.  HbA1C: No results found for: HGBA1C  CBG: No results for input(s): GLUCAP in the last 168 hours.  Review of Systems:   Review of Systems  Constitutional:  Negative for chills, fever and weight loss.  Respiratory:  Positive for cough, sputum production, shortness of breath and wheezing. Negative for hemoptysis.   Cardiovascular:  Negative for chest pain.     Past Medical History:  She,  has a past medical history of COPD (chronic obstructive pulmonary disease) (HCC), Depression, GERD (gastroesophageal reflux disease), History of SCC (squamous cell carcinoma) of  skin (05/12/2020), Hyperlipidemia, Hypertension, and Squamous cell carcinoma of skin (06/12/2019).   Surgical History:   Past Surgical History:  Procedure Laterality Date   BIOPSY  07/07/2023   Procedure: BIOPSY;  Surgeon: Jinny Carmine, MD;  Location: [facility] ENDOSCOPY;  Service: Endoscopy;;   COLONOSCOPY WITH PROPOFOL  N/A 07/07/2023   Procedure: COLONOSCOPY WITH PROPOFOL ;  Surgeon: Jinny Carmine, MD;  Location: [facility] ENDOSCOPY;  Service: Endoscopy;  Laterality: N/A;   CORONARY STENT INTERVENTION N/A 02/22/2021   Procedure: CORONARY STENT INTERVENTION;  Surgeon: Darron Deatrice LABOR, MD;  Location: [facility] INVASIVE CV LAB;  Service: Cardiovascular;  Laterality: N/A;   ESOPHAGOGASTRODUODENOSCOPY (EGD) WITH PROPOFOL  N/A 07/07/2023   Procedure: ESOPHAGOGASTRODUODENOSCOPY (EGD) WITH PROPOFOL ;  Surgeon: Jinny Carmine, MD;  Location: [facility] ENDOSCOPY;  Service: Endoscopy;  Laterality: N/A;   LEFT HEART CATH AND CORONARY ANGIOGRAPHY N/A 02/22/2021   Procedure: LEFT HEART CATH AND CORONARY ANGIOGRAPHY;  Surgeon: Darron Deatrice LABOR, MD;  Location: [facility] INVASIVE CV LAB;  Service: Cardiovascular;  Laterality: N/A;   NECK SURGERY N/A 10/2003   POLYPECTOMY  07/07/2023   Procedure: POLYPECTOMY;  Surgeon: Jinny Carmine, MD;  Location: [facility] ENDOSCOPY;  Service: Endoscopy;;   SHOULDER SURGERY Left 2004     Social History:   reports that she has been smoking cigarettes. She started smoking about 49 years ago. She has a 12.5 pack-year smoking history. She has never used smokeless tobacco. She reports that she does not currently use alcohol. She reports that she does not use drugs.   Family History:  Her family history includes Diabetes in her father; Heart disease in her daughter and father; Hypertension in her mother and son; Pulmonary embolism in her daughter; Stroke in her mother. There is no history of Breast cancer.   Allergies Allergies  Allergen Reactions   Levofloxacin Shortness Of Breath   Penicillins Hives     itching   Meloxicam      dizziness   Nsaids Other (See Comments)    Patient states she can tolerate up to three doses per day without incident     Home Medications  Prior to Admission medications   Medication Sig Start Date End Date Taking? Authorizing Provider  ACCU-CHEK GUIDE test strip USE AS DIRECTED TO Check blood glucose 03/25/22   [provider]  Accu-Chek Softclix Lancets lancets as directed. 03/25/22   [provider]  albuterol  (PROVENTIL ) (2.5 MG/3ML) 0.083% nebulizer solution Take 3 mLs (2.5 mg total) by nebulization 4 (four) times daily. 01/07/22   Rizwan, Saima, MD  albuterol  (VENTOLIN  HFA) 108 [phone] Base) MCG/ACT inhaler Inhale 2 puffs into the lungs every 4 (four) hours as needed for wheezing or shortness of breath. 05/20/23   Shalhoub, Zachary PARAS, MD  aspirin  81 MG EC tablet TAKE 1 TABLET BY MOUTH  EVERY DAY 04/22/19   Sowles, Krichna, MD  atorvastatin  (LIPITOR ) 40 MG tablet Take 2 tablets (80 mg total) by mouth daily. 09/07/21   Sowles, Krichna, MD  BRILINTA  90 MG TABS tablet TAKE ONE TABLET BY MOUTH EVERY MORNING and TAKE ONE TABLET BY MOUTH EVERYDAY AT BEDTIME 02/15/22   Darron Deatrice LABOR, MD  citalopram  (CELEXA ) 20 MG tablet TAKE 1 AND 1/2 TABLETS BY MOUTH EVERY EVENING 12/08/21   Sowles, Krichna, MD  cyanocobalamin  1000 MCG tablet Take 1 tablet (1,000 mcg total) by mouth daily. 05/21/23   Shalhoub, Zachary PARAS, MD  ferrous sulfate  325 (65 FE) MG tablet Take 1 tablet (325 mg total) by mouth daily before breakfast. Take on an empty stomach with one cup of juice, preferably orange juice 05/20/23   Shalhoub, Zachary PARAS, MD  fluticasone  (FLONASE ) 50 MCG/ACT nasal spray PLACE TWO SPRAYS into BOTH nostrils DAILY Patient taking differently: Place 2 sprays into both nostrils daily as needed. PLACE TWO SPRAYS into BOTH nostrils DAILY 12/03/21   Sowles, Krichna, MD  Fluticasone -Umeclidin-Vilant (TRELEGY ELLIPTA ) 100-62.5-25 MCG/ACT AEPB Inhale 1 puff into the lungs daily. 05/20/23    Shalhoub, Zachary PARAS, MD  HYDROcodone -acetaminophen  (NORCO) 10-325 MG tablet Take 1 tablet by mouth 4 (four) times daily as needed. 06/01/23   [provider]  ipratropium-albuterol  (DUONEB) 0.5-2.5 (3) MG/3ML SOLN Take 3 mLs by nebulization every 6 (six) hours as needed. 03/11/22   [provider]  loratadine  (ALLERGY RELIEF) 10 MG tablet Take 1 tablet (10 mg total) by mouth daily. 09/07/21   Sowles, Krichna, MD  losartan  (COZAAR ) 25 MG tablet Take 1 tablet (25 mg total) by mouth every evening. 07/10/23   Dorinda Drue DASEN, MD  metoprolol  succinate (TOPROL -XL) 25 MG 24 hr tablet Take 0.5 tablets (12.5 mg total) by mouth every evening. 07/10/23   Dorinda Drue DASEN, MD  NARCAN  4 MG/0.1ML LIQD nasal spray kit 1 spray as needed. As needed in case of overdose 04/13/21   [provider]  pantoprazole  (PROTONIX ) 40 MG tablet Take 1 tablet (40 mg total) by mouth 2 (two) times daily. 07/10/23   Dorinda Drue DASEN, MD      I spent 80 minutes caring for this patient today, including preparing to see the patient, obtaining a medical history , reviewing a separately obtained history, performing a medically appropriate examination and/or evaluation, counseling and educating the patient/family/caregiver, ordering medications, tests, or procedures, referring and communicating with other health care professionals (not separately reported), documenting clinical information in the electronic health record, and independently interpreting results (not separately reported/billed) and communicating results to the patient/family/caregiver    Belva November, MD [facility] 07/15/2023 5:28 PM

## 2023-07-15 NOTE — ED Provider Notes (Signed)
 [facility] Provider Note   Event Date/Time   First MD Initiated Contact with Patient 07/15/23 [phone]     (approximate) History  Respiratory Distress  HPI Terri Burns is a 73 y.o. female with past medical history of severe COPD who presents via EMS from home for shortness of breath, wheezing, and hypoxia.  Upon EMS arrival, she was found to be in the low 80s prior to CPAP being placed with improvement in patient's respiratory status.  She was given 3 DuoNebs and 3 albuterol  inhaler treatments with only mild improvement in her respiratory rate.  They also did note patient had some 1+ pitting edema to bilateral lower extremities with a history of CHF.  Patient also notes productive cough over the last 3 days as well as chills and subjective fevers. ROS: Patient currently denies any vision changes, tinnitus, difficulty speaking, facial droop, sore throat, abdominal pain, nausea/vomiting/diarrhea, dysuria, or weakness/numbness/paresthesias in any extremity   Physical Exam  Triage Vital Signs: ED Triage Vitals  Encounter Vitals Group     BP      Systolic BP Percentile      Diastolic BP Percentile      Pulse      Resp      Temp      Temp src      SpO2      Weight      Height      Head Circumference      Peak Flow      Pain Score      Pain Loc      Pain Education      Exclude from Growth Chart    Most recent vital signs: Vitals:   07/15/23 1330 07/15/23 1424  BP: (!) 144/89   Pulse: 96   Resp: 17   Temp:    SpO2: 95% 96%   General: Awake, oriented x4. CV:  Good peripheral perfusion.  Resp:  Increased effort.  Expiratory wheezing over bilateral lung fields.  BiPAP in place Abd:  No distention.  Other:  Elderly overweight Caucasian female resting comfortably in no moderate distress ED Results / Procedures / Treatments  Labs (all labs ordered are listed, but only abnormal results are displayed) Labs Reviewed  BRAIN NATRIURETIC PEPTIDE - Abnormal;  Notable for the following components:      Result Value   B Natriuretic Peptide 291.1 (*)    All other components within normal limits  COMPREHENSIVE METABOLIC PANEL - Abnormal; Notable for the following components:   Glucose, Bld 121 (*)    Calcium  8.2 (*)    Total Protein 6.1 (*)    Albumin 3.1 (*)    All other components within normal limits  CBC WITH DIFFERENTIAL/PLATELET - Abnormal; Notable for the following components:   WBC 13.7 (*)    RBC 3.63 (*)    Hemoglobin 10.4 (*)    HCT 34.4 (*)    RDW 17.6 (*)    Platelets 460 (*)    Neutro Abs 10.4 (*)    Abs Immature Granulocytes 0.15 (*)    All other components within normal limits  BLOOD GAS, VENOUS - Abnormal; Notable for the following components:   pH, Ven 7.23 (*)    pO2, Ven 65 (*)    Acid-base deficit 4.5 (*)    All other components within normal limits  TROPONIN I (HIGH SENSITIVITY) - Abnormal; Notable for the following components:   Troponin I (High Sensitivity) 23 (*)  All other components within normal limits  TROPONIN I (HIGH SENSITIVITY) - Abnormal; Notable for the following components:   Troponin I (High Sensitivity) 48 (*)    All other components within normal limits  RESP PANEL BY RT-PCR (RSV, FLU A&B, COVID)  [id number]  CULTURE, BLOOD (ROUTINE X 2)  CULTURE, BLOOD (ROUTINE X 2)  LACTIC ACID, PLASMA  TYPE AND SCREEN   EKG ED ECG REPORT I, Artist MARLA Kerns, the attending physician, personally viewed and interpreted this ECG. Date: 07/15/2023 EKG Time: 0911 Rate: 103 Rhythm: Tachycardic sinus rhythm QRS Axis: normal Intervals: normal ST/T Wave abnormalities: normal Narrative Interpretation: Tachycardic sinus rhythm.  No evidence of acute ischemia RADIOLOGY ED MD interpretation: CT angiography of the chest shows no evidence of pulmonary embolism with increased accretions from the trachea and both mainstem bronchi similar bronchitic changes in both lower lobes with mucous airway impaction.  There is also  irregular nodule in the superior segment of the right lower lobe with slightly decreased in size compared to previous imaging.  Single view portable chest x-ray shows scattered ill-defined parenchymal opacities bilaterally similar and remain compatible with areas of consolidated airspace disease -Agree with radiology assessment Official radiology report(s): CT Angio Chest PE W/Cm &/Or Wo Cm Result Date: 07/15/2023 CLINICAL DATA:  Shortness of breath. EXAM: CT ANGIOGRAPHY CHEST WITH CONTRAST TECHNIQUE: Multidetector CT imaging of the chest was performed using the standard protocol during bolus administration of intravenous contrast. Multiplanar CT image reconstructions and MIPs were obtained to evaluate the vascular anatomy. RADIATION DOSE REDUCTION: This exam was performed according to the departmental dose-optimization program which includes automated exposure control, adjustment of the mA and/or kV according to patient size and/or use of iterative reconstruction technique. CONTRAST:  75mL OMNIPAQUE  IOHEXOL  350 MG/ML SOLN COMPARISON:  CT chest dated June 08, 2023. FINDINGS: Cardiovascular: Satisfactory opacification of the pulmonary arteries to the segmental level. No evidence of pulmonary embolism. Unchanged borderline cardiomegaly. No pericardial effusion. 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 Similar mild dilatation of the main and left and right pulmonary arteries. Coronary, aortic arch, and branch vessel atherosclerotic vascular disease. Mediastinum/Nodes: Unchanged mildly enlarged right hilar lymph node measuring up to 1.2 cm in short axis. No enlarged mediastinal or axillary lymph nodes. Thyroid gland and esophagus demonstrate no significant findings. Lungs/Pleura: Increased secretions within the trachea and both mainstem bronchi. Similar paraseptal and centrilobular emphysema. Irregular nodule in the superior segment of the  right lower lobe has slightly decreased in size, currently measuring 6 x 4 mm, previously 8 x 5 mm. New similar-appearing irregular nodule in the more medial superior segment of the right lower lobe, measuring 6 x 7 mm (series 5, image 62). Similar mucous airways impaction in both lower lobe distal bronchi. Improved peripheral subpleural atelectasis in the right lower lobe. Unchanged bronchiectasis and scarring with volume loss in the superior segment of the left lower lobe. Similar pleuroparenchymal scarring in the remaining left lower lobe and the lingula. No new consolidation, pleural effusion, or pneumothorax. Upper Abdomen: No acute abnormality. Musculoskeletal: No chest wall abnormality. No acute or significant osseous findings. Review of the MIP images confirms the above findings. IMPRESSION: 1. No evidence of pulmonary embolism. 2. Increased secretions within the trachea and both mainstem bronchi. Similar bronchitic changes in both lower lobes with mucous airways impaction. 3. Irregular nodule in the superior segment of the right lower lobe has slightly decreased in size. New similar-appearing irregular nodule in the more  medial superior segment of the right lower lobe, measuring 6 x 7 mm. Findings potentially reflect waxing and waning atypical infection. 4. Aortic Atherosclerosis (ICD10-I70.0) and Emphysema (ICD10-J43.9). Electronically Signed   By: Elsie ONEIDA Shoulder M.D.   On: 07/15/2023 14:37   DG Chest Port 1 View Result Date: 07/15/2023 CLINICAL DATA:  Shortness of breath. EXAM: PORTABLE CHEST 1 VIEW COMPARISON:  07/02/2023 FINDINGS: The cardiopericardial silhouette is within normal limits for size. Scattered ill-defined parenchymal opacities bilaterally are similar to prior and remain compatible with areas of consolidative airspace disease and/or architectural distortion seen on CT 06/08/2023. No substantial pleural effusion. No pulmonary edema. No acute bony abnormality. Telemetry leads overlie the  chest. IMPRESSION: Scattered ill-defined parenchymal opacities bilaterally are similar to prior and remain compatible with areas of consolidative airspace disease and/or architectural distortion seen on CT 06/08/2023. Electronically Signed   By: Camellia Candle M.D.   On: 07/15/2023 09:44   PROCEDURES: Critical Care performed: Yes, see critical care procedure note(s) .1-3 Lead EKG Interpretation  Performed by: Jossie Artist POUR, MD Authorized by: Jossie Artist POUR, MD     Interpretation: abnormal     ECG rate:  111   ECG rate assessment: tachycardic     Rhythm: sinus tachycardia     Ectopy: none     Conduction: normal   CRITICAL CARE Performed by: Jere Vanburen K Lesleyann Fichter  Total critical care time: 51 minutes  Critical care time was exclusive of separately billable procedures and treating other patients.  Critical care was necessary to treat or prevent imminent or life-threatening deterioration.  Critical care was time spent personally by me on the following activities: development of treatment plan with patient and/or surrogate as well as nursing, discussions with consultants, evaluation of patient's response to treatment, examination of patient, obtaining history from patient or surrogate, ordering and performing treatments and interventions, ordering and review of laboratory studies, ordering and review of radiographic studies, pulse oximetry and re-evaluation of patient's condition.  MEDICATIONS ORDERED IN ED: Medications  albuterol  (PROVENTIL ) (2.5 MG/3ML) 0.083% nebulizer solution 9 mL (0 mLs Nebulization Stopped 07/15/23 1230)  lactated ringers  infusion ( Intravenous New Bag/Given 07/15/23 1052)  ipratropium-albuterol  (DUONEB) 0.5-2.5 (3) MG/3ML nebulizer solution 3 mL (3 mLs Nebulization Given 07/15/23 1439)  albuterol  (PROVENTIL ) (2.5 MG/3ML) 0.083% nebulizer solution 2.5 mg (has no administration in time range)  fluticasone  furoate-vilanterol (BREO ELLIPTA ) 100-25 MCG/ACT 1 puff (1 puff Inhalation  Given 07/15/23 1423)    And  umeclidinium bromide  (INCRUSE ELLIPTA ) 62.5 MCG/ACT 1 puff (1 puff Inhalation Given 07/15/23 1423)  HYDROcodone -acetaminophen  (NORCO) 10-325 MG per tablet 1 tablet (1 tablet Oral Given 07/15/23 1509)  acetaminophen  (TYLENOL ) tablet 650 mg (has no administration in time range)    Or  acetaminophen  (TYLENOL ) suppository 650 mg (has no administration in time range)  ondansetron  (ZOFRAN ) tablet 4 mg (has no administration in time range)    Or  ondansetron  (ZOFRAN ) injection 4 mg (has no administration in time range)  heparin  injection 5,000 Units (has no administration in time range)  senna-docusate (Senokot-S) tablet 1 tablet (has no administration in time range)  azithromycin  (ZITHROMAX ) 500 mg in sodium chloride  0.9 % 250 mL IVPB (has no administration in time range)  methylPREDNISolone  sodium succinate (SOLU-MEDROL ) 125 mg/2 mL injection 125 mg (125 mg Intravenous Given 07/15/23 0929)  lactated ringers  bolus 1,000 mL (0 mLs Intravenous Stopped 07/15/23 1206)  cefTRIAXone  (ROCEPHIN ) 2 g in sodium chloride  0.9 % 100 mL IVPB (0 g Intravenous Stopped 07/15/23 1206)  azithromycin  (ZITHROMAX )  500 mg in sodium chloride  0.9 % 250 mL IVPB (0 mg Intravenous Stopped 07/15/23 1206)  iohexol  (OMNIPAQUE ) 350 MG/ML injection 75 mL (75 mLs Intravenous Contrast Given 07/15/23 1402)   IMPRESSION / MDM / ASSESSMENT AND PLAN / ED COURSE  I reviewed the triage vital signs and the nursing notes.                             The patient is on the cardiac monitor to evaluate for evidence of arrhythmia and/or significant heart rate changes. Patient's presentation is most consistent with acute presentation with potential threat to life or bodily function. The patient appears to be suffering from a moderate/severe exacerbation of COPD.  Based on the history, exam, CXR/EKG reviewed by me, and further workup I don't suspect any other emergent cause of this presentation, such as pneumonia, acute coronary  syndrome, congestive heart failure, pulmonary embolism, or pneumothorax.  ED Interventions: bronchodilators, steroids, antibiotics, reassess  Reassessment: After treatment, the patient's shortness of breath is improving but patient is still requiring supplemental oxygenation with BiPAP  Disposition: Admit   FINAL CLINICAL IMPRESSION(S) / ED DIAGNOSES   Final diagnoses:  Acute respiratory failure with hypoxia (HCC)  Sepsis with acute hypoxic respiratory failure without septic shock, due to unspecified organism (HCC)  COPD exacerbation (HCC)   Rx / DC Orders   ED Discharge Orders     None      Note:  This document was prepared using Dragon voice recognition software and may include unintentional dictation errors.   Jossie Artist POUR, MD 07/15/23 [phone]

## 2023-07-15 NOTE — ED Triage Notes (Signed)
 Pt brought in from home by EMS for shortness of breath. Pt states SOB started at about 5am today. Pt satting 84% on RA upon EMS arrival. EMS administered 3 duoneb treatments and 3 albuterol  treatments; per EMS patient's oxygenation improved but her respiratory effort did not so she was placed on CPAP. Upon arrival to ED, pt is on CPAP and satting 100%; respirations are even and unlabored. PT switched to BiPap by respiratory.

## 2023-07-16 DIAGNOSIS — I2510 Atherosclerotic heart disease of native coronary artery without angina pectoris: Secondary | ICD-10-CM

## 2023-07-16 DIAGNOSIS — F17210 Nicotine dependence, cigarettes, uncomplicated: Secondary | ICD-10-CM

## 2023-07-16 DIAGNOSIS — R7989 Other specified abnormal findings of blood chemistry: Secondary | ICD-10-CM

## 2023-07-16 DIAGNOSIS — J441 Chronic obstructive pulmonary disease with (acute) exacerbation: Secondary | ICD-10-CM | POA: Diagnosis not present

## 2023-07-16 DIAGNOSIS — I10 Essential (primary) hypertension: Secondary | ICD-10-CM | POA: Diagnosis not present

## 2023-07-16 DIAGNOSIS — J9601 Acute respiratory failure with hypoxia: Secondary | ICD-10-CM | POA: Diagnosis not present

## 2023-07-16 DIAGNOSIS — J9602 Acute respiratory failure with hypercapnia: Secondary | ICD-10-CM | POA: Diagnosis not present

## 2023-07-16 DIAGNOSIS — I5033 Acute on chronic diastolic (congestive) heart failure: Secondary | ICD-10-CM

## 2023-07-16 LAB — RESPIRATORY PANEL BY PCR

## 2023-07-16 LAB — BASIC METABOLIC PANEL
Anion gap: 8 (ref 5–15)
BUN: 11 mg/dL (ref 8–23)
CO2: 23 mmol/L (ref 22–32)
Calcium: 8.1 mg/dL — ABNORMAL LOW (ref 8.9–10.3)
Chloride: 106 mmol/L (ref 98–111)
Creatinine, Ser: 0.48 mg/dL (ref 0.44–1.00)
GFR, Estimated: >60 mL/min (ref >60)
Glucose, Bld: 113 mg/dL — ABNORMAL HIGH (ref 70–99)
Potassium: 3.8 mmol/L (ref 3.5–5.1)
Sodium: 137 mmol/L (ref 135–145)

## 2023-07-16 LAB — CBC
HCT: 27 % — ABNORMAL LOW (ref 36.0–46.0)
Hemoglobin: 8.6 g/dL — ABNORMAL LOW (ref 12.0–15.0)
MCH: 28.5 pg (ref 26.0–34.0)
MCHC: 31.9 g/dL (ref 30.0–36.0)
MCV: 89.4 fL (ref 80.0–100.0)
Platelets: 343 10*3/uL (ref 150–400)
RBC: 3.02 MIL/uL — ABNORMAL LOW (ref 3.87–5.11)
RDW: 17.3 % — ABNORMAL HIGH (ref 11.5–15.5)
WBC: 9.4 10*3/uL (ref 4.0–10.5)
nRBC: 0 % (ref 0.0–0.2)

## 2023-07-16 MED ORDER — FUROSEMIDE 10 MG/ML IJ SOLN
20.0000 mg | Freq: Every day | INTRAMUSCULAR | Status: DC
  Administered 2023-07-16 – 2023-07-23 (×8): 20 mg via INTRAVENOUS
  Filled 2023-07-16 (×8): qty 4

## 2023-07-16 NOTE — Progress Notes (Signed)
 RN called pharmacy to verify that Lactated Ringers  is compatible with Cefepime . Pharmacist said that it is.

## 2023-07-16 NOTE — Plan of Care (Signed)

## 2023-07-16 NOTE — Progress Notes (Signed)
 Progress Note   Patient: Terri Burns FMW:[id number] DOB: December 09, 1950 DOA: 07/15/2023     1 DOS: the patient was seen and examined on 07/16/2023   Brief hospital course: Terri Burns is a 73 year old female with history of severe COPD, hyperlipidemia, depression, anxiety, hypertension, GERD, who presents emergency department from home via EMS for chief concerns of shortness of breath.   Patient was initially on BiPAP therapy later oxygen titrated to 2 to 3 L.  She is admitted to the hospitalist service for further management evaluation of acute on chronic hypoxic hypercapnic respiratory failure, COPD exacerbation.  Pulmonology consulted for further management and PAP device at night.  Assessment and Plan: * Acute hypoxic on chronic hypercapnic respiratory failure (HCC) Acute COPD exacerbation. Acute on chronic diastolic CHF exacerbation. Due to COPD exacerbation and possible CHF exacerbation. Patient will be continued on supplemental oxygen to maintain saturation greater than 92%. Continue DuoNebs Q4 hourly. Taper prednisone  to 40 mg daily for 5 days. Continue cefepime  and azithromycin  therapy. Pulmonology evaluation for trilogy/CPAP/NIPPV therapy nightly at home Prior echocardiogram reviewed, recent admission records reviewed.  I will start her on IV Lasix  20 mg daily. Monitor daily weights, strict input and output. Continue oxygen supplementation to maintain SpO2 greater than 92%  Anxiety and depression Home citalopram  30 mg daily resumed  Elevated troponin Possibly due to demand ischemia Patient denies chest pain and current shortness of breath at bedside Low clinical suspicion for ACS at this time.  Coronary artery disease involving native coronary artery of native heart without angina pectoris Continue aspirin  81 mg daily, home Brilinta  90 mg p.o. twice daily, atorvastatin  80 mg nightly  Nicotine  dependence, cigarettes, uncomplicated Discussed ill effects of tobacco. Counseled  smoking cessation. Nicotine  patches ordered.  Benign essential HTN Continue losartan  25 mg, Metoprolol  succinate 12.5 mg.   Out of bed to chair. Incentive spirometry. Nursing supportive care. Fall, aspiration precautions. DVT prophylaxis   Code Status: Limited: Do not attempt resuscitation (DNR) -DNR-LIMITED -Do Not Intubate/DNI   Subjective: Patient is seen and examined today morning. She has respiratory distress, currently on 3 L supplemental oxygen.  She asked about oxygen therapy upon discharge.  Physical Exam: Vitals:   07/16/23 0015 07/16/23 0125 07/16/23 0719 07/16/23 0819  BP: (!) 161/95   (!) 151/90  Pulse: 88  88 78  Resp: 20  (!) 24 18  Temp: 98.3 F (36.8 C)   98.5 F (36.9 C)  TempSrc: Oral     SpO2: 99% 99% 98% 100%  Weight:      Height:        General - Elderly ill looking Caucasian female, moderate respiratory distress HEENT - PERRLA, EOMI, atraumatic head, non tender sinuses. Lung - Decreased breath sounds, basal rhonchi, diffuse wheezes. Heart - S1, S2 heard, no murmurs, rubs, trace pedal edema. Abdomen - Soft, non tender, non distended, bowel sounds good Neuro - Alert, awake and oriented x 3, non focal exam. Skin - Warm and dry.  Data Reviewed:      Latest Ref Rng & Units 07/16/2023    4:45 AM 07/15/2023    9:20 AM 07/10/2023    2:58 AM  CBC  WBC 4.0 - 10.5 K/uL 9.4  13.7  4.2   Hemoglobin 12.0 - 15.0 g/dL 8.6  89.5  9.1   Hematocrit 36.0 - 46.0 % 27.0  34.4  28.6   Platelets 150 - 400 K/uL 343  460  168       Latest Ref Rng & Units  07/16/2023    4:45 AM 07/15/2023    9:20 AM 07/10/2023    2:58 AM  BMP  Glucose 70 - 99 mg/dL 886  878  864   BUN 8 - 23 mg/dL 11  13  11    Creatinine 0.44 - 1.00 mg/dL 9.51  9.39  9.31   Sodium 135 - 145 mmol/L 137  141  138   Potassium 3.5 - 5.1 mmol/L 3.8  4.2  4.0   Chloride 98 - 111 mmol/L 106  110  104   CO2 22 - 32 mmol/L 23  22  28    Calcium  8.9 - 10.3 mg/dL 8.1  8.2  8.0    CT Angio Chest PE W/Cm &/Or Wo  Cm Result Date: 07/15/2023 CLINICAL DATA:  Shortness of breath. EXAM: CT ANGIOGRAPHY CHEST WITH CONTRAST TECHNIQUE: Multidetector CT imaging of the chest was performed using the standard protocol during bolus administration of intravenous contrast. Multiplanar CT image reconstructions and MIPs were obtained to evaluate the vascular anatomy. RADIATION DOSE REDUCTION: This exam was performed according to the departmental dose-optimization program which includes automated exposure control, adjustment of the mA and/or kV according to patient size and/or use of iterative reconstruction technique. CONTRAST:  75mL OMNIPAQUE  IOHEXOL  350 MG/ML SOLN COMPARISON:  CT chest dated June 08, 2023. FINDINGS: Cardiovascular: Satisfactory opacification of the pulmonary arteries to the segmental level. No evidence of pulmonary embolism. Unchanged borderline cardiomegaly. No pericardial effusion. 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 Similar mild dilatation of the main and left and right pulmonary arteries. Coronary, aortic arch, and branch vessel atherosclerotic vascular disease. Mediastinum/Nodes: Unchanged mildly enlarged right hilar lymph node measuring up to 1.2 cm in short axis. No enlarged mediastinal or axillary lymph nodes. Thyroid gland and esophagus demonstrate no significant findings. Lungs/Pleura: Increased secretions within the trachea and both mainstem bronchi. Similar paraseptal and centrilobular emphysema. Irregular nodule in the superior segment of the right lower lobe has slightly decreased in size, currently measuring 6 x 4 mm, previously 8 x 5 mm. New similar-appearing irregular nodule in the more medial superior segment of the right lower lobe, measuring 6 x 7 mm (series 5, image 62). Similar mucous airways impaction in both lower lobe distal bronchi. Improved peripheral subpleural atelectasis in the right lower lobe. Unchanged  bronchiectasis and scarring with volume loss in the superior segment of the left lower lobe. Similar pleuroparenchymal scarring in the remaining left lower lobe and the lingula. No new consolidation, pleural effusion, or pneumothorax. Upper Abdomen: No acute abnormality. Musculoskeletal: No chest wall abnormality. No acute or significant osseous findings. Review of the MIP images confirms the above findings. IMPRESSION: 1. No evidence of pulmonary embolism. 2. Increased secretions within the trachea and both mainstem bronchi. Similar bronchitic changes in both lower lobes with mucous airways impaction. 3. Irregular nodule in the superior segment of the right lower lobe has slightly decreased in size. New similar-appearing irregular nodule in the more medial superior segment of the right lower lobe, measuring 6 x 7 mm. Findings potentially reflect waxing and waning atypical infection. 4. Aortic Atherosclerosis (ICD10-I70.0) and Emphysema (ICD10-J43.9). Electronically Signed   By: Elsie ONEIDA Shoulder M.D.   On: 07/15/2023 14:37   DG Chest Port 1 View Result Date: 07/15/2023 CLINICAL DATA:  Shortness of breath. EXAM: PORTABLE CHEST 1 VIEW COMPARISON:  07/02/2023 FINDINGS: The cardiopericardial silhouette is within normal limits for size. Scattered ill-defined parenchymal opacities bilaterally are similar to prior and remain  compatible with areas of consolidative airspace disease and/or architectural distortion seen on CT 06/08/2023. No substantial pleural effusion. No pulmonary edema. No acute bony abnormality. Telemetry leads overlie the chest. IMPRESSION: Scattered ill-defined parenchymal opacities bilaterally are similar to prior and remain compatible with areas of consolidative airspace disease and/or architectural distortion seen on CT 06/08/2023. Electronically Signed   By: Camellia Candle M.D.   On: 07/15/2023 09:44   Family Communication: Discussed with patient, she understand and agree. All questions  answereed.  Disposition: Status is: Inpatient Remains inpatient appropriate because: Respiratory failure, COPD  Planned Discharge Destination: Home with Home Health     Time spent: 40 minutes  Author: Concepcion Riser, MD 07/16/2023 11:59 AM Secure chat 7am to 7pm For on call review [email or web address].

## 2023-07-16 NOTE — Progress Notes (Signed)
 NAME:  [facility]ri [facility], MRN:  [id number], DOB:  1950-11-18, LOS: 1 ADMISSION DATE:  07/15/2023, CHIEF COMPLAINT:  Respiratory Failure   History of Present Illness:   Patient is a pleasant 73 year old female with a past medical history of COPD previously followed by [facility] who presents to the hospital with increased shortness of breath and hypoxia.   Patient was recently admitted to the hospital on 07/02/2023 and discharged on 07/10/2023 for hypoxic respiratory failure secondary to COPD exacerbation.  She had presented with cough, wheeze, shortness of breath, and productive sputum.  While in the hospital, she was noted to have an upper GI bleed with EGD showing a duodenal ulcer.  Symptoms felt to be secondary to concomitant decompensated heart failure as well as mild COPD exacerbation.  She was to be discharged on oxygen but reports that this was not delivered.  Review of the medical record is also notable for admission for COPD exacerbation early December 2024 and early January 2025.   Upon discharge home, patient's symptoms worsened and she felt extremely short of breath with any movement.  She was having cough productive of sputum but this is not increased compared to prior.  Cough is productive of cream-colored sputum. Patient denies hemoptysis.  She reports a wheeze as well as chest tightness.   She reports history of COPD for many years now and was previously seen by [facility], last seen in 2022.  No PFTs noted in the chart.  She was previously maintained on Trelegy which was last dispensed on 06/22/2023 but she does not appear to have been taking it prior to that.   She reports a longstanding history of smoking, having started at the age of 81 and quit a couple years ago.  She has around 50 to 55 pack years of smoking history.  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  Denies any work in set designer or other occupational exposures.  Pertinent  Medical History  -CAD  (NSTEMI 2022 with PCI to proximal LAD) -HFpEF -HTN -HLD -Presumed COPD  Interim History / Subjective:   2/8: admit to [facility], reports shortness of breath and cough 2/9: feels better this AM but continues to be short of breath. Cough a little improved. Tolerated NIPPV overnight. Working with IS.  Objective   Blood pressure (!) 151/90, pulse 78, temperature 98.5 F (36.9 C), resp. rate 18, height 4' 10 (1.473 m), weight 55.3 kg, SpO2 100%.        Intake/Output Summary (Last 24 hours) at 07/16/2023 0924 Last data filed at 07/16/2023 9491 Gross per 24 hour  Intake 340 ml  Output --  Net 340 ml   Filed Weights   07/15/23 0912  Weight: 55.3 kg    Examination: Physical Exam Constitutional:      General: She is not in acute distress.    Appearance: She is ill-appearing.  Cardiovascular:     Rate and Rhythm: Normal rate and regular rhythm.     Pulses: Normal pulses.     Heart sounds: Normal heart sounds.  Pulmonary:     Effort: Pulmonary effort is normal.     Breath sounds: No wheezing, rhonchi or rales.     Comments: Decreased breath sounds bilaterally Neurological:     General: No focal deficit present.     Mental Status: She is oriented to person, place, and time. Mental status is at baseline.       Assessment & Plan:   #Acute  hypoxic and hypercapnic respiratory failure #Presumed COPD exacerbation #Chronic bronchitis #CAD #HFpEF   Presents with increased shortness of breath, cough productive of sputum, and wheeze with concomitant hypoxia and hypercapnia consistent with acute hypoxic and hypercapnic respiratory failure secondary to presumed COPD exacerbation. Imaging was independently reviewed by me and notable for severe emphysema as well as airway thickening with secretions pooling in the airways. She likely has a chronic bronchitis phenotype given her reported symptoms. That said, she's not had PFT's to confirm this diagnosis.  She was previously prescribed triple  therapy with Trelegy but was lost to follow-up in clinic.   She does come in with hypoxic and hypercapnic respiratory failure and was initiated on NIPPV. Given recurrence, and previous blood gas showing hypercapnia, I would recommend continuing her on NIPPV nocturnally on discharge. She might require an ABG to qualify as she's only had venous blood gases. She will require oxygen on discharge (at rest and with exertion) given significant hypoxia.   For inpatient management, I would recommend initiating the patient on broad-spectrum antibiotics given findings of patchy opacities on imaging as well as copious secretions. Would add Cefepime  to Azithromycin  given recurrent admission, previous pseudomonas isolation in sputum, and high risk for resistant organisms (cultures, MRSA screen, viral panel ordered). Given she's had recurrent symptoms over the past few months, with 3 admissions and hospitalizations, I would recommend discharge on thrice weekly Azithromycin  (250 mg Monday/Wednesday/Friday).    While inpatient, would initiate short course of prednisone  (40 mg daily x 5 days) in addition to standing SABA/SAMA nebulizers (duo-nebs). She will benefit from aggressive pulmonary toilet to help clear her secretions (noted on imaging and by symptoms). We have started a flutter device, incentive spirometer, manual chest percussion, and hypertonic saline nebs. Finally, would recommend discharge on standing triple therapy inhalers (would prefer HFA device such as Breztri + spacer device, Stiolto Respimat+ Flovent , or budesonide  plus formoterol  plus revefenacin  nebulized).   Recommendations:   -duonebs every 4 hours -prednisone  40 mg daily for 5 days -hypertonic saline nebs twice daily -flutter device, incentive spirometer, pulmonary toilet -Cefepime /Azithromycin  for pneumonia coverage -respiratory cultures and viral panel ordered, MRSA screen -consider thrice weekly azithromycin  on discharge -triple therapy  inhaler on discharge, recommend HFA or respimat device vs nebulizer -oxygen and NIPPV on discharge -outpatient follow up with pulmonary  Belva November, MD [facility] 07/16/2023 11:07 AM    Labs   CBC: Recent Labs  Lab 07/10/23 0258 07/15/23 0920 07/16/23 0445  WBC 4.2 13.7* 9.4  NEUTROABS 2.3 10.4*  --   HGB 9.1* 10.4* 8.6*  HCT 28.6* 34.4* 27.0*  MCV 91.7 94.8 89.4  PLT 168 460* 343    Basic Metabolic Panel: Recent Labs  Lab 07/10/23 0258 07/15/23 0920 07/16/23 0445  NA 138 141 137  K 4.0 4.2 3.8  CL 104 110 106  CO2 28 22 23   GLUCOSE 135* 121* 113*  BUN 11 13 11   CREATININE 0.68 0.60 0.48  CALCIUM  8.0* 8.2* 8.1*   GFR: Estimated Creatinine Clearance: 47.6 mL/min (by C-G formula based on SCr of 0.48 mg/dL). Recent Labs  Lab 07/10/23 0258 07/15/23 0920 07/15/23 1037 07/15/23 1207 07/16/23 0445  PROCALCITON  --   --   --  <0.10  --   WBC 4.2 13.7*  --   --  9.4  LATICACIDVEN  --   --  1.4  --   --     Liver Function Tests: Recent Labs  Lab 07/15/23 0920  AST 34  ALT 30  ALKPHOS 55  BILITOT 0.5  PROT 6.1*  ALBUMIN 3.1*   No results for input(s): LIPASE, AMYLASE in the last 168 hours. No results for input(s): AMMONIA in the last 168 hours.  ABG    Component Value Date/Time   PHART 7.36 02/02/2021 0731   PCO2ART 44 02/02/2021 0731   PO2ART 101 02/02/2021 0731   HCO3 23.9 07/15/2023 0911   ACIDBASEDEF 4.5 (H) 07/15/2023 0911   O2SAT 92.3 07/15/2023 0911     Coagulation Profile: No results for input(s): INR, PROTIME in the last 168 hours.  Cardiac Enzymes: No results for input(s): CKTOTAL, CKMB, CKMBINDEX, TROPONINI in the last 168 hours.  HbA1C: No results found for: HGBA1C  CBG: No results for input(s): GLUCAP in the last 168 hours.  Review of Systems:   Review of Systems  Constitutional:  Negative for chills, fever and weight loss.  Respiratory:  Positive for cough, sputum production and  shortness of breath. Negative for hemoptysis and wheezing.   Cardiovascular:  Negative for chest pain.     Past Medical History:  She,  has a past medical history of COPD (chronic obstructive pulmonary disease) (HCC), Depression, GERD (gastroesophageal reflux disease), History of SCC (squamous cell carcinoma) of skin (05/12/2020), Hyperlipidemia, Hypertension, and Squamous cell carcinoma of skin (06/12/2019).   Surgical History:   Past Surgical History:  Procedure Laterality Date   BIOPSY  07/07/2023   Procedure: BIOPSY;  Surgeon: Jinny Carmine, MD;  Location: [facility] ENDOSCOPY;  Service: Endoscopy;;   COLONOSCOPY WITH PROPOFOL  N/A 07/07/2023   Procedure: COLONOSCOPY WITH PROPOFOL ;  Surgeon: Jinny Carmine, MD;  Location: [facility] ENDOSCOPY;  Service: Endoscopy;  Laterality: N/A;   CORONARY STENT INTERVENTION N/A 02/22/2021   Procedure: CORONARY STENT INTERVENTION;  Surgeon: Darron Deatrice LABOR, MD;  Location: [facility] INVASIVE CV LAB;  Service: Cardiovascular;  Laterality: N/A;   ESOPHAGOGASTRODUODENOSCOPY (EGD) WITH PROPOFOL  N/A 07/07/2023   Procedure: ESOPHAGOGASTRODUODENOSCOPY (EGD) WITH PROPOFOL ;  Surgeon: Jinny Carmine, MD;  Location: [facility] ENDOSCOPY;  Service: Endoscopy;  Laterality: N/A;   LEFT HEART CATH AND CORONARY ANGIOGRAPHY N/A 02/22/2021   Procedure: LEFT HEART CATH AND CORONARY ANGIOGRAPHY;  Surgeon: Darron Deatrice LABOR, MD;  Location: [facility] INVASIVE CV LAB;  Service: Cardiovascular;  Laterality: N/A;   NECK SURGERY N/A 10/2003   POLYPECTOMY  07/07/2023   Procedure: POLYPECTOMY;  Surgeon: Jinny Carmine, MD;  Location: [facility] ENDOSCOPY;  Service: Endoscopy;;   SHOULDER SURGERY Left 2004     Social History:   reports that she has been smoking cigarettes. She started smoking about 49 years ago. She has a 12.5 pack-year smoking history. She has never used smokeless tobacco. She reports that she does not currently use alcohol. She reports that she does not use drugs.   Family History:  Her family  history includes Diabetes in her father; Heart disease in her daughter and father; Hypertension in her mother and son; Pulmonary embolism in her daughter; Stroke in her mother. There is no history of Breast cancer.   Allergies Allergies  Allergen Reactions   Levofloxacin Shortness Of Breath   Penicillins Hives    itching   Meloxicam      dizziness   Nsaids Other (See Comments)    Patient states she can tolerate up to three doses per day without incident     Home Medications  Prior to Admission medications   Medication Sig Start Date End Date Taking? Authorizing Provider  albuterol  (PROVENTIL ) (2.5 MG/3ML) 0.083% nebulizer solution Take 3 mLs (2.5 mg total) by  nebulization 4 (four) times daily. 01/07/22  Yes Rizwan, Saima, MD  albuterol  (VENTOLIN  HFA) 108 (90 Base) MCG/ACT inhaler Inhale 2 puffs into the lungs every 4 (four) hours as needed for wheezing or shortness of breath. 05/20/23  Yes Shalhoub, Zachary PARAS, MD  aspirin  81 MG EC tablet TAKE 1 TABLET BY MOUTH EVERY DAY 04/22/19  Yes Sowles, Krichna, MD  atorvastatin  (LIPITOR ) 40 MG tablet Take 2 tablets (80 mg total) by mouth daily. 09/07/21  Yes Sowles, Krichna, MD  BRILINTA  90 MG TABS tablet TAKE ONE TABLET BY MOUTH EVERY MORNING and TAKE ONE TABLET BY MOUTH EVERYDAY AT BEDTIME 02/15/22  Yes Arida, Deatrice LABOR, MD  citalopram  (CELEXA ) 20 MG tablet TAKE 1 AND 1/2 TABLETS BY MOUTH EVERY EVENING 12/08/21  Yes Sowles, Krichna, MD  cyanocobalamin  1000 MCG tablet Take 1 tablet (1,000 mcg total) by mouth daily. 05/21/23  Yes Shalhoub, Zachary PARAS, MD  ferrous sulfate  325 (65 FE) MG tablet Take 1 tablet (325 mg total) by mouth daily before breakfast. Take on an empty stomach with one cup of juice, preferably orange juice 05/20/23  Yes Shalhoub, Zachary PARAS, MD  fluticasone  (FLONASE ) 50 MCG/ACT nasal spray PLACE TWO SPRAYS into BOTH nostrils DAILY Patient taking differently: Place 2 sprays into both nostrils daily as needed. PLACE TWO SPRAYS into BOTH nostrils  DAILY 12/03/21  Yes Sowles, Krichna, MD  Fluticasone -Umeclidin-Vilant (TRELEGY ELLIPTA ) 100-62.5-25 MCG/ACT AEPB Inhale 1 puff into the lungs daily. 05/20/23  Yes Shalhoub, Zachary PARAS, MD  HYDROcodone -acetaminophen  (NORCO) 10-325 MG tablet Take 1 tablet by mouth 4 (four) times daily as needed. 06/01/23  Yes [provider]  hydrOXYzine  (ATARAX ) 25 MG tablet Take 25 mg by mouth daily. 06/29/23  Yes [provider]  ipratropium-albuterol  (DUONEB) 0.5-2.5 (3) MG/3ML SOLN Take 3 mLs by nebulization every 6 (six) hours as needed. 03/11/22  Yes [provider]  loratadine  (ALLERGY RELIEF) 10 MG tablet Take 1 tablet (10 mg total) by mouth daily. 09/07/21  Yes Sowles, Krichna, MD  losartan  (COZAAR ) 25 MG tablet Take 1 tablet (25 mg total) by mouth every evening. 07/10/23  Yes Dorinda Drue DASEN, MD  metoprolol  succinate (TOPROL -XL) 25 MG 24 hr tablet Take 0.5 tablets (12.5 mg total) by mouth every evening. 07/10/23  Yes Dorinda Drue DASEN, MD  NARCAN  4 MG/0.1ML LIQD nasal spray kit 1 spray as needed. As needed in case of overdose 04/13/21  Yes [provider]  pantoprazole  (PROTONIX ) 40 MG tablet Take 1 tablet (40 mg total) by mouth 2 (two) times daily. 07/10/23  Yes Dorinda Drue DASEN, MD  ACCU-CHEK GUIDE test strip USE AS DIRECTED TO Check blood glucose 03/25/22   [provider]  Accu-Chek Softclix Lancets lancets as directed. 03/25/22   [provider]      I spent 36 minutes caring for this patient today, including preparing to see the patient, obtaining a medical history , reviewing a separately obtained history, performing a medically appropriate examination and/or evaluation, counseling and educating the patient/family/caregiver, ordering medications, tests, or procedures, and documenting clinical information in the electronic health record      Belva November, MD [place] [facility] 07/16/2023 11:07 AM

## 2023-07-16 NOTE — Progress Notes (Signed)
 Pt's respiratory panel came back negative. NP notified.

## 2023-07-16 NOTE — Progress Notes (Signed)
 TELE called and said that pt was off leads. RN went to patient room. Nurse Aides were in the room and said that they were moving the patient and that they fixed the leads.

## 2023-07-17 ENCOUNTER — Encounter: Payer: Self-pay | Admitting: Gastroenterology

## 2023-07-17 DIAGNOSIS — J441 Chronic obstructive pulmonary disease with (acute) exacerbation: Secondary | ICD-10-CM | POA: Diagnosis not present

## 2023-07-17 DIAGNOSIS — J9601 Acute respiratory failure with hypoxia: Secondary | ICD-10-CM | POA: Diagnosis not present

## 2023-07-17 DIAGNOSIS — I10 Essential (primary) hypertension: Secondary | ICD-10-CM | POA: Diagnosis not present

## 2023-07-17 DIAGNOSIS — I2510 Atherosclerotic heart disease of native coronary artery without angina pectoris: Secondary | ICD-10-CM | POA: Diagnosis not present

## 2023-07-17 LAB — BASIC METABOLIC PANEL
Anion gap: 6 (ref 5–15)
BUN: 17 mg/dL (ref 8–23)
CO2: 28 mmol/L (ref 22–32)
Calcium: 8.1 mg/dL — ABNORMAL LOW (ref 8.9–10.3)
Chloride: 105 mmol/L (ref 98–111)
Creatinine, Ser: 0.55 mg/dL (ref 0.44–1.00)
GFR, Estimated: >60 mL/min (ref >60)
Glucose, Bld: 86 mg/dL (ref 70–99)
Potassium: 3.8 mmol/L (ref 3.5–5.1)
Sodium: 139 mmol/L (ref 135–145)

## 2023-07-17 LAB — CBC
HCT: 28 % — ABNORMAL LOW (ref 36.0–46.0)
Hemoglobin: 8.8 g/dL — ABNORMAL LOW (ref 12.0–15.0)
MCH: 28.4 pg (ref 26.0–34.0)
MCHC: 31.4 g/dL (ref 30.0–36.0)
MCV: 90.3 fL (ref 80.0–100.0)
Platelets: 384 10*3/uL (ref 150–400)
RBC: 3.1 MIL/uL — ABNORMAL LOW (ref 3.87–5.11)
RDW: 17.2 % — ABNORMAL HIGH (ref 11.5–15.5)
WBC: 10 10*3/uL (ref 4.0–10.5)
nRBC: 0 % (ref 0.0–0.2)

## 2023-07-17 MED ORDER — IPRATROPIUM-ALBUTEROL 0.5-2.5 (3) MG/3ML IN SOLN
3.0000 mL | Freq: Three times a day (TID) | RESPIRATORY_TRACT | Status: DC
  Administered 2023-07-17 – 2023-07-27 (×27): 3 mL via RESPIRATORY_TRACT
  Filled 2023-07-17 (×28): qty 3

## 2023-07-17 NOTE — Evaluation (Signed)
 Physical Therapy Evaluation Patient Details Name: [facility] MRN: [id number] DOB: Feb 20, 1951 Today's Date: 07/17/2023  History of Present Illness  Pt is a 73 y.o. female presenting to [facility] ED with increased SOB, workup for COPD exacerbation. PMH significant for chronic HFpEF, CAD status post stenting on Brilinta , iron  deficiency anemia, anxiety/depression, GERD, HTN, & HLD, COPD.   Clinical Impression  Pt alert, agreeable to mobilize to commode, denied pain. At baseline she had been home alone for only a few days, but had been walking previous to other hospital admission, independent in ADLs. She was able to perform supine <> sit with supervision, sit <> Stand with CGA/handheld assist and step pivot to commode. modI for pericare and RW utilized to step pivot back to EOB with improved steadiness noted. Pt SOB with mobility and speaking but spO2 >90% throughout on 2L.  Overall the patient demonstrated deficits (see "PT Problem List") that impede the patient's functional abilities, safety, and mobility and would benefit from skilled PT intervention.          If plan is discharge home, recommend the following: A little help with walking and/or transfers;A little help with bathing/dressing/bathroom;Assist for transportation;Help with stairs or ramp for entrance;Assistance with cooking/housework   Can travel by private vehicle   No    Equipment Recommendations None recommended by PT  Recommendations for Other Services       Functional Status Assessment Patient has had a recent decline in their functional status and demonstrates the ability to make significant improvements in function in a reasonable and predictable amount of time.     Precautions / Restrictions Precautions Precautions: Fall Precaution Comments: watch O2 Restrictions Weight Bearing Restrictions Per Provider Order: No      Mobility  Bed Mobility Overal bed mobility: Needs Assistance Bed Mobility: Supine to Sit, Sit  to Supine     Supine to sit: Supervision Sit to supine: Supervision        Transfers Overall transfer level: Needs assistance Equipment used: Rolling walker (2 wheels), None Transfers: Sit to/from Stand, Bed to chair/wheelchair/BSC Sit to Stand: Supervision   Step pivot transfers: Contact guard assist       General transfer comment: handheld assist CGA step pivot to commode, RW to step pivot back to bed. pt declined any ambulation due to SOb, fatigue    Ambulation/Gait                  Stairs            Wheelchair Mobility     Tilt Bed    Modified Rankin (Stroke Patients Only)       Balance Overall balance assessment: Needs assistance Sitting-balance support: Feet supported, No upper extremity supported Sitting balance-Leahy Scale: Good     Standing balance support: During functional activity, Bilateral upper extremity supported Standing balance-Leahy Scale: Poor                               Pertinent Vitals/Pain Pain Assessment Pain Assessment: No/denies pain    Home Living Family/patient expects to be discharged to:: Private residence Living Arrangements: Alone Available Help at Discharge: Family;Available PRN/intermittently;Friend(s) Type of Home: Apartment Home Access: Ramped entrance       Home Layout: One level Home Equipment: Shower seat;Toilet riser;Rollator (4 wheels) Additional Comments: has a safety pull in her bathroom that alerts outside to let someone know she needs help    Prior Function Prior Level  of Function : Independent/Modified Independent             Mobility Comments: Has a rollator she uses on occasion for community mobility. ADLs Comments: IND with ADLs, friends/family assist with driving. Otherwise independent for ADL management. Recently started [facility] OT/PT.     Extremity/Trunk Assessment   Upper Extremity Assessment Upper Extremity Assessment: Generalized weakness    Lower Extremity  Assessment Lower Extremity Assessment: Generalized weakness       Communication      Cognition Arousal: Alert Behavior During Therapy: WFL for tasks assessed/performed Overall Cognitive Status: Within Functional Limits for tasks assessed                                          General Comments      Exercises     Assessment/Plan    PT Assessment Patient needs continued PT services  PT Problem List Decreased strength;Cardiopulmonary status limiting activity;Decreased range of motion;Decreased activity tolerance;Decreased knowledge of use of DME;Decreased safety awareness;Decreased balance;Decreased mobility;Decreased knowledge of precautions       PT Treatment Interventions DME instruction;Therapeutic exercise;Gait training;Balance training;Stair training;Neuromuscular re-education;Functional mobility training;Therapeutic activities;Patient/family education;Modalities    PT Goals (Current goals can be found in the Care Plan section)  Acute Rehab PT Goals Patient Stated Goal: to breathe better PT Goal Formulation: With patient Time For Goal Achievement: 07/31/23 Potential to Achieve Goals: Good    Frequency Min 1X/week     Co-evaluation               AM-PAC PT "6 Clicks" Mobility  Outcome Measure Help needed turning from your back to your side while in a flat bed without using bedrails?: A Little Help needed moving from lying on your back to sitting on the side of a flat bed without using bedrails?: A Little Help needed moving to and from a bed to a chair (including a wheelchair)?: A Little Help needed standing up from a chair using your arms (e.g., wheelchair or bedside chair)?: A Little Help needed to walk in hospital room?: A Lot Help needed climbing 3-5 steps with a railing? : A Lot 6 Click Score: 16    End of Session Equipment Utilized During Treatment: Oxygen Activity Tolerance: Patient tolerated treatment well Patient left: in bed;with  call bell/phone within reach;with bed alarm set Nurse Communication: Mobility status PT Visit Diagnosis: Muscle weakness (generalized) (M62.81);Unsteadiness on feet (R26.81);Difficulty in walking, not elsewhere classified (R26.2)    Time: 1451-1501 PT Time Calculation (min) (ACUTE ONLY): 10 min   Charges:   PT Evaluation $PT Eval Low Complexity: 1 Low   PT General Charges $$ ACUTE PT VISIT: 1 Visit         Darien Eden PT, DPT 3:48 PM,07/17/23

## 2023-07-17 NOTE — Progress Notes (Signed)
 Progress Note   Patient: Terri Burns WUJ:[id number] DOB: 10-Jan-1951 DOA: 07/15/2023     2 DOS: the patient was seen and examined on 07/17/2023   Brief hospital course: Terri Burns is a 73 year old female with history of severe COPD, hyperlipidemia, depression, anxiety, hypertension, GERD, who presents emergency department from home via EMS for chief concerns of shortness of breath.   Patient was initially on BiPAP therapy later oxygen titrated to 2 to 3 L.  She is admitted to the hospitalist service for further management evaluation of acute on chronic hypoxic hypercapnic respiratory failure, COPD exacerbation.  Pulmonology consulted for further management and PAP device at night.  Assessment and Plan: * Acute hypoxic on chronic hypercapnic respiratory failure (HCC) Acute COPD exacerbation. Acute on chronic diastolic CHF exacerbation. Patient will be continued on supplemental oxygen to maintain saturation greater than 92%. Continue DuoNebs Q4 hourly and as needed. Taper prednisone  to 40 mg daily for 5 days. Continue cefepime  and azithromycin  therapy. Pulmonology evaluation for trilogy/CPAP/NIPPV therapy nightly at home Prior echocardiogram reviewed, recent admission records reviewed.   Continue IV Lasix  20 mg daily for 2 days. Monitor daily weights, strict input and output. Encourage incentive spirometry, out of bed to chair. PT OT follow-up.  Anxiety and depression Continue citalopram  30 mg daily.  Elevated troponin Continue to demand ischemia Patient denies chest pain. Low clinical suspicion for ACS at this time.  Coronary artery disease involving native coronary artery of native heart without angina pectoris Continue aspirin  81 mg daily, home Brilinta  90 mg p.o. twice daily, atorvastatin  80 mg nightly  Nicotine  dependence, cigarettes, uncomplicated Discussed ill effects of tobacco. Counseled smoking cessation. Nicotine  patch as needed.  Benign essential HTN Continue  losartan  25 mg, Metoprolol  succinate 12.5 mg.   Out of bed to chair. Incentive spirometry. Nursing supportive care. Fall, aspiration precautions. DVT prophylaxis   Code Status: Limited: Do not attempt resuscitation (DNR) -DNR-LIMITED -Do Not Intubate/DNI   Subjective: Patient is seen and examined today morning. She worked with OT is very short of breath. currently on 2-3 L supplemental oxygen.   Physical Exam: Vitals:   07/17/23 0303 07/17/23 0600 07/17/23 0755 07/17/23 0845  BP:  (!) 140/90  131/89  Pulse:  72  83  Resp:  20  16  Temp:  98.4 F (36.9 C)  98.9 F (37.2 C)  TempSrc:  Oral    SpO2: 100%  98% 100%  Weight:      Height:        General - Elderly ill looking Caucasian female, moderate respiratory distress HEENT - PERRLA, EOMI, atraumatic head, non tender sinuses. Lung - Decreased breath sounds, basal rhonchi, diffuse wheezes. Heart - S1, S2 heard, no murmurs, rubs, trace pedal edema. Abdomen - Soft, non tender, non distended, bowel sounds good Neuro - Alert, awake and oriented x 3, non focal exam. Skin - Warm and dry.  Data Reviewed:      Latest Ref Rng & Units 07/17/2023    4:57 AM 07/16/2023    4:45 AM 07/15/2023    9:20 AM  CBC  WBC 4.0 - 10.5 K/uL 10.0  9.4  13.7   Hemoglobin 12.0 - 15.0 g/dL 8.8  8.6  95.6   Hematocrit 36.0 - 46.0 % 28.0  27.0  34.4   Platelets 150 - 400 K/uL 384  343  460       Latest Ref Rng & Units 07/17/2023    4:57 AM 07/16/2023    4:45 AM 07/15/2023  9:20 AM  BMP  Glucose 70 - 99 mg/dL 86  161  096   BUN 8 - 23 mg/dL 17  11  13    Creatinine 0.44 - 1.00 mg/dL 0.45  4.09  8.11   Sodium 135 - 145 mmol/L 139  137  141   Potassium 3.5 - 5.1 mmol/L 3.8  3.8  4.2   Chloride 98 - 111 mmol/L 105  106  110   CO2 22 - 32 mmol/L 28  23  22    Calcium  8.9 - 10.3 mg/dL 8.1  8.1  8.2    CT Angio Chest PE W/Cm &/Or Wo Cm Result Date: 07/15/2023 CLINICAL DATA:  Shortness of breath. EXAM: CT ANGIOGRAPHY CHEST WITH CONTRAST TECHNIQUE:  Multidetector CT imaging of the chest was performed using the standard protocol during bolus administration of intravenous contrast. Multiplanar CT image reconstructions and MIPs were obtained to evaluate the vascular anatomy. RADIATION DOSE REDUCTION: This exam was performed according to the departmental dose-optimization program which includes automated exposure control, adjustment of the mA and/or kV according to patient size and/or use of iterative reconstruction technique. CONTRAST:  75mL OMNIPAQUE  IOHEXOL  350 MG/ML SOLN COMPARISON:  CT chest dated June 08, 2023. FINDINGS: Cardiovascular: Satisfactory opacification of the pulmonary arteries to the segmental level. No evidence of pulmonary embolism. Unchanged borderline cardiomegaly. No pericardial effusion. 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 Similar mild dilatation of the main and left and right pulmonary arteries. Coronary, aortic arch, and branch vessel atherosclerotic vascular disease. Mediastinum/Nodes: Unchanged mildly enlarged right hilar lymph node measuring up to 1.2 cm in short axis. No enlarged mediastinal or axillary lymph nodes. Thyroid gland and esophagus demonstrate no significant findings. Lungs/Pleura: Increased secretions within the trachea and both mainstem bronchi. Similar paraseptal and centrilobular emphysema. Irregular nodule in the superior segment of the right lower lobe has slightly decreased in size, currently measuring 6 x 4 mm, previously 8 x 5 mm. New similar-appearing irregular nodule in the more medial superior segment of the right lower lobe, measuring 6 x 7 mm (series 5, image 62). Similar mucous airways impaction in both lower lobe distal bronchi. Improved peripheral subpleural atelectasis in the right lower lobe. Unchanged bronchiectasis and scarring with volume loss in the superior segment of the left lower lobe. Similar pleuroparenchymal scarring  in the remaining left lower lobe and the lingula. No new consolidation, pleural effusion, or pneumothorax. Upper Abdomen: No acute abnormality. Musculoskeletal: No chest wall abnormality. No acute or significant osseous findings. Review of the MIP images confirms the above findings. IMPRESSION: 1. No evidence of pulmonary embolism. 2. Increased secretions within the trachea and both mainstem bronchi. Similar bronchitic changes in both lower lobes with mucous airways impaction. 3. Irregular nodule in the superior segment of the right lower lobe has slightly decreased in size. New similar-appearing irregular nodule in the more medial superior segment of the right lower lobe, measuring 6 x 7 mm. Findings potentially reflect waxing and waning atypical infection. 4. Aortic Atherosclerosis (ICD10-I70.0) and Emphysema (ICD10-J43.9). Electronically Signed   By: Aleta Anda M.D.   On: 07/15/2023 14:37   Family Communication: Discussed with patient, she understand and agree. All questions answereed.  Disposition: Status is: Inpatient Remains inpatient appropriate because: Respiratory failure, COPD  Planned Discharge Destination: Home with Home Health vs Rehab     Time spent: 38 minutes  Author: Aisha Hove, MD 07/17/2023 10:27 AM Secure chat 7am to 7pm For on call review  [email or web address]

## 2023-07-17 NOTE — Evaluation (Signed)
 Occupational Therapy Evaluation Patient Details Name: Terri Burns MRN: [id number] DOB: 09/06/1950 Today's Date: 07/17/2023   History of Present Illness Pt is a 73 y.o. female presenting to [facility] ED with increased SOB and hypoxia.  of cough, wheezing, SOB and malaise. Found to have GI bleed, CHF, acute hypoxic respiratory failure, COPD exacerbation and chronic symptomatic anemia. PMH significant for chronic HFpEF, CAD status post stenting on Brilinta , iron  deficiency anemia, anxiety/depression, GERD, HTN, & HLD.   Clinical Impression   Pt was seen for OT evaluation this date. Prior to hospital admission, pt was home for short time from previous recent hospital admission and endorses her O2 was never delivered to home and became too SOB. Pt presents to acute OT demonstrating impaired ADL performance and functional mobility 2/2 impaired activity tolerance, balance, and strength (See OT problem list for additional functional deficits). Pt currently requires SBA-CGA for ADL mobility/transfers, increased assist for LB ADL 2/2 impairments. Requires rest breaks with minimal exertion. SpO2 >97% on 2L throughout but visibly SOB and unable to carry simple conversation. HR up to 90's. Pt would benefit from skilled OT services to address noted impairments and functional limitations (see below for any additional details) in order to maximize safety and independence while minimizing falls risk and caregiver burden.     If plan is discharge home, recommend the following: A little help with walking and/or transfers;Assistance with cooking/housework;Assist for transportation;Help with stairs or ramp for entrance;A lot of help with bathing/dressing/bathroom    Functional Status Assessment  Patient has had a recent decline in their functional status and demonstrates the ability to make significant improvements in function in a reasonable and predictable amount of time.  Equipment Recommendations  None recommended by  OT    Recommendations for Other Services       Precautions / Restrictions Precautions Precautions: Fall Precaution Comments: watch O2 Restrictions Weight Bearing Restrictions Per Provider Order: No      Mobility Bed Mobility Overal bed mobility: Needs Assistance Bed Mobility: Supine to Sit, Sit to Supine     Supine to sit: Supervision Sit to supine: Supervision   General bed mobility comments: need for rest break once EOB to recover 2/2 SOB    Transfers Overall transfer level: Needs assistance Equipment used: Rolling walker (2 wheels) Transfers: Sit to/from Stand, Bed to chair/wheelchair/BSC Sit to Stand: Supervision     Step pivot transfers: Contact guard assist            Balance Overall balance assessment: Needs assistance Sitting-balance support: Feet supported, No upper extremity supported Sitting balance-Leahy Scale: Good     Standing balance support: During functional activity, Bilateral upper extremity supported Standing balance-Leahy Scale: Fair                             ADL either performed or assessed with clinical judgement   ADL Overall ADL's : Needs assistance/impaired                         Toilet Transfer: Contact guard assist;Rolling walker (2 wheels);BSC/3in1   Toileting- Clothing Manipulation and Hygiene: Set up;Contact guard assist;Sit to/from stand       Functional mobility during ADLs: Contact guard assist;Rolling walker (2 wheels)       Vision         Perception         Praxis         Pertinent  Vitals/Pain Pain Assessment Pain Assessment: No/denies pain     Extremity/Trunk Assessment Upper Extremity Assessment Upper Extremity Assessment: Generalized weakness   Lower Extremity Assessment Lower Extremity Assessment: Generalized weakness   Cervical / Trunk Assessment Cervical / Trunk Assessment: Kyphotic Cervical / Trunk Exceptions: baseline R Lateral head Tilt while seated; Hx of  Cervical Fx and scoliosis   Communication Communication Communication: No apparent difficulties   Cognition Arousal: Alert Behavior During Therapy: WFL for tasks assessed/performed Overall Cognitive Status: Within Functional Limits for tasks assessed                                       General Comments       Exercises Other Exercises Other Exercises: Pt encouraged to utilize PLB to support recover and SOB, encouraged to use pulse oximeter   Shoulder Instructions      Home Living Family/patient expects to be discharged to:: Private residence Living Arrangements: Alone Available Help at Discharge: Family;Available PRN/intermittently;Friend(s) Type of Home: Apartment Home Access: Ramped entrance     Home Layout: One level     Bathroom Shower/Tub: Tub/shower unit         Home Equipment: Shower seat;Toilet riser;Rollator (4 wheels)          Prior Functioning/Environment Prior Level of Function : Independent/Modified Independent             Mobility Comments: Has a rollator she uses on occasion for community mobility. ADLs Comments: IND with ADLs, friends/family assist with driving. Otherwise independent for ADL management. Recently started [facility] OT/PT.        OT Problem List: Decreased strength;Cardiopulmonary status limiting activity;Decreased range of motion;Decreased activity tolerance;Impaired balance (sitting and/or standing);Decreased knowledge of use of DME or AE      OT Treatment/Interventions: Self-care/ADL training;Therapeutic exercise;Therapeutic activities;Energy conservation;DME and/or AE instruction;Patient/family education;Balance training    OT Goals(Current goals can be found in the care plan section) Acute Rehab OT Goals Patient Stated Goal: be less SOB OT Goal Formulation: With patient Time For Goal Achievement: 07/31/23 Potential to Achieve Goals: Good ADL Goals Additional ADL Goal #1: Pt will complete morning ADL routine  including toileting, grooming, and dressing with modified independence utilizing learned ECS to minimize SOB, 2/2 opportunities. Additional ADL Goal #2: Pt will verbalize plan to implement at least 1 learned falls prevention strategy to maximize safety/indep with ADL/mobility.  OT Frequency: Min 1X/week    Co-evaluation              AM-PAC OT "6 Clicks" Daily Activity     Outcome Measure Help from another person eating meals?: None Help from another person taking care of personal grooming?: A Little Help from another person toileting, which includes using toliet, bedpan, or urinal?: A Little Help from another person bathing (including washing, rinsing, drying)?: A Little Help from another person to put on and taking off regular upper body clothing?: A Little Help from another person to put on and taking off regular lower body clothing?: A Little 6 Click Score: 19   End of Session Equipment Utilized During Treatment: Oxygen;Rolling walker (2 wheels) Nurse Communication: Mobility status  Activity Tolerance: Patient tolerated treatment well Patient left: in bed;with call bell/phone within reach;with bed alarm set;Other (comment);with nursing/sitter in room (MD, RN in room)  OT Visit Diagnosis: Other abnormalities of gait and mobility (R26.89);Muscle weakness (generalized) (M62.81)  Time: 1002-1015 OT Time Calculation (min): 13 min Charges:  OT General Charges $OT Visit: 1 Visit OT Evaluation $OT Eval Low Complexity: 1 Low  Berenda Breaker., MPH, MS, OTR/L ascom [phone] 07/17/23, 10:54 AM

## 2023-07-18 DIAGNOSIS — I10 Essential (primary) hypertension: Secondary | ICD-10-CM | POA: Diagnosis not present

## 2023-07-18 DIAGNOSIS — J441 Chronic obstructive pulmonary disease with (acute) exacerbation: Secondary | ICD-10-CM | POA: Diagnosis not present

## 2023-07-18 DIAGNOSIS — I2510 Atherosclerotic heart disease of native coronary artery without angina pectoris: Secondary | ICD-10-CM | POA: Diagnosis not present

## 2023-07-18 DIAGNOSIS — J9601 Acute respiratory failure with hypoxia: Secondary | ICD-10-CM | POA: Diagnosis not present

## 2023-07-18 NOTE — Plan of Care (Signed)

## 2023-07-18 NOTE — Plan of Care (Signed)
Problem: Education: Goal: Knowledge of General Education information will improve Description: Including pain rating scale, medication(s)/side effects and non-pharmacologic comfort measures Outcome: Progressing   Problem: Activity: Goal: Risk for activity intolerance will decrease Outcome: Progressing   Problem: Nutrition: Goal: Adequate nutrition will be maintained Outcome: Progressing   Problem: Coping: Goal: Level of anxiety will decrease Outcome: Progressing   Problem: Skin Integrity: Goal: Risk for impaired skin integrity will decrease Outcome: Progressing

## 2023-07-18 NOTE — Progress Notes (Signed)
Progress Note   Patient: Terri Burns ZOX:[id number] DOB: 20-Feb-1951 DOA: 07/15/2023     3 DOS: the patient was seen and examined on 07/18/2023   Brief hospital course: Terri Burns is a 73 year old female with history of severe COPD, hyperlipidemia, depression, anxiety, hypertension, GERD, who presents emergency department from home via EMS for chief concerns of shortness of breath.   Patient was initially on BiPAP therapy later oxygen titrated to 2 to 3 L.  She is admitted to the hospitalist service for further management evaluation of acute on chronic hypoxic hypercapnic respiratory failure, COPD exacerbation.  Pulmonology consulted for further management and PAP device at night.  Assessment and Plan: * Acute hypoxic on chronic hypercapnic respiratory failure (HCC) Acute COPD exacerbation. Acute on chronic diastolic CHF exacerbation. Patient will be continued on supplemental oxygen to maintain saturation greater than 92%. Continue DuoNebs Q4 hourly and as needed. Taper prednisone to 40 mg daily for 5 days. Continue cefepime and azithromycin therapy. Pulmonology consult appreciated - consider Azithro 3 times weekly, triple therapy inhaler, oxygen, NIPPV on dc. Prior echocardiogram from recent admission records reviewed.   Changed IV Lasix 20 mg to oral daily. Monitor daily weights, strict input and output. Encourage incentive spirometry, out of bed to chair. PT OT suggested SNF. She wishes to go home.  Anxiety and depression Continue citalopram 30 mg daily.  Elevated troponin Continue to demand ischemia Patient denies chest pain. Low clinical suspicion for ACS at this time.  Coronary artery disease involving native coronary artery of native heart without angina pectoris Continue aspirin 81 mg daily, home Brilinta 90 mg p.o. twice daily, atorvastatin 80 mg nightly  Nicotine dependence, cigarettes, uncomplicated Discussed ill effects of tobacco. Counseled smoking  cessation. Nicotine patch as needed.  Benign essential HTN Continue losartan 25 mg, Metoprolol succinate 12.5 mg.   Out of bed to chair. Incentive spirometry. Nursing supportive care. Fall, aspiration precautions. DVT prophylaxis   Code Status: Limited: Do not attempt resuscitation (DNR) -DNR-LIMITED -Do Not Intubate/DNI   Subjective: Patient is seen and examined today morning. States her short of breath better today. Continue to be on on 2L supplemental oxygen. Eating better. Discussed about rehab she wishes to continue to work with PT and plan to go home.  Physical Exam: Vitals:   07/17/23 2111 07/17/23 2334 07/18/23 0751 07/18/23 0759  BP:  (!) 153/87 (!) 144/98   Pulse:  84 75   Resp:  18 16   Temp:  98.5 F (36.9 C) 98.1 F (36.7 C)   TempSrc:   Oral   SpO2: 98% 99% 100% 100%  Weight:      Height:        General - Elderly ill looking Caucasian female, mild respiratory distress HEENT - PERRLA, EOMI, atraumatic head, non tender sinuses. Lung - Decreased breath sounds, basal rhonchi, diffuse wheezes. Heart - S1, S2 heard, no murmurs, rubs, trace pedal edema. Abdomen - Soft, non tender, non distended, bowel sounds good Neuro - Alert, awake and oriented x 3, non focal exam. Skin - Warm and dry.  Data Reviewed:      Latest Ref Rng & Units 07/17/2023    4:57 AM 07/16/2023    4:45 AM 07/15/2023    9:20 AM  CBC  WBC 4.0 - 10.5 K/uL 10.0  9.4  13.7   Hemoglobin 12.0 - 15.0 g/dL 8.8  8.6  81.1   Hematocrit 36.0 - 46.0 % 28.0  27.0  34.4   Platelets 150 - 400 K/uL  384  343  460       Latest Ref Rng & Units 07/17/2023    4:57 AM 07/16/2023    4:45 AM 07/15/2023    9:20 AM  BMP  Glucose 70 - 99 mg/dL 86  409  811   BUN 8 - 23 mg/dL 17  11  13    Creatinine 0.44 - 1.00 mg/dL 9.14  7.82  9.56   Sodium 135 - 145 mmol/L 139  137  141   Potassium 3.5 - 5.1 mmol/L 3.8  3.8  4.2   Chloride 98 - 111 mmol/L 105  106  110   CO2 22 - 32 mmol/L 28  23  22    Calcium 8.9 - 10.3 mg/dL  8.1  8.1  8.2    No results found.  Family Communication: Discussed with patient, she understand and agree. All questions answereed.  Disposition: Status is: Inpatient Remains inpatient appropriate because: Respiratory failure, COPD/ CHF  Planned Discharge Destination: Home with Home Health vs Rehab     Time spent: 37 minutes  Author: Marcelino Duster, MD 07/18/2023 3:19 PM Secure chat 7am to 7pm For on call review [email or web address].

## 2023-07-19 DIAGNOSIS — J9612 Chronic respiratory failure with hypercapnia: Secondary | ICD-10-CM | POA: Diagnosis not present

## 2023-07-19 DIAGNOSIS — J9601 Acute respiratory failure with hypoxia: Secondary | ICD-10-CM | POA: Diagnosis not present

## 2023-07-19 LAB — BASIC METABOLIC PANEL
Anion gap: 7 (ref 5–15)
BUN: 24 mg/dL — ABNORMAL HIGH (ref 8–23)
CO2: 26 mmol/L (ref 22–32)
Calcium: 8.1 mg/dL — ABNORMAL LOW (ref 8.9–10.3)
Chloride: 105 mmol/L (ref 98–111)
Creatinine, Ser: 0.63 mg/dL (ref 0.44–1.00)
GFR, Estimated: >60 mL/min (ref >60)
Glucose, Bld: 76 mg/dL (ref 70–99)
Potassium: 3.8 mmol/L (ref 3.5–5.1)
Sodium: 138 mmol/L (ref 135–145)

## 2023-07-19 LAB — BLOOD GAS, ARTERIAL
Acid-Base Excess: 1.5 mmol/L (ref 0.0–2.0)
Bicarbonate: 26.6 mmol/L (ref 20.0–28.0)
O2 Content: 2 L/min
O2 Saturation: 98.4 %
Patient temperature: 37
pCO2 arterial: 43 mm[Hg] (ref 32–48)
pH, Arterial: 7.4 (ref 7.35–7.45)
pO2, Arterial: 111 mm[Hg] — ABNORMAL HIGH (ref 83–108)

## 2023-07-19 LAB — CBC
HCT: 27.1 % — ABNORMAL LOW (ref 36.0–46.0)
Hemoglobin: 8.7 g/dL — ABNORMAL LOW (ref 12.0–15.0)
MCH: 28.5 pg (ref 26.0–34.0)
MCHC: 32.1 g/dL (ref 30.0–36.0)
MCV: 88.9 fL (ref 80.0–100.0)
Platelets: 367 10*3/uL (ref 150–400)
RBC: 3.05 MIL/uL — ABNORMAL LOW (ref 3.87–5.11)
RDW: 16.9 % — ABNORMAL HIGH (ref 11.5–15.5)
WBC: 8.6 10*3/uL (ref 4.0–10.5)
nRBC: 0 % (ref 0.0–0.2)

## 2023-07-19 NOTE — Plan of Care (Signed)
?  Problem: Clinical Measurements: ?Goal: Will remain free from infection ?Outcome: Progressing ?  ?

## 2023-07-19 NOTE — Progress Notes (Signed)
Progress Note    EVERETT RICCIARDELLI  ZOX:[id number] DOB: 04/03/1951  DOA: 07/15/2023 PCP: Louis Matte, MD      Brief Narrative:    Medical records reviewed and are as summarized below:  Terri Burns is a 73 y.o. female with history of severe COPD, hyperlipidemia, depression, anxiety, hypertension, GERD, presented to the hospital with shortness of breath.    Assessment/Plan:   Principal Problem:   Acute hypoxic on chronic hypercapnic respiratory failure (HCC) Active Problems:   COPD exacerbation (HCC)   Acute on chronic diastolic CHF (congestive heart failure) (HCC)   Anxiety and depression   GERD (gastroesophageal reflux disease)   Benign essential HTN   Senile purpura (HCC)   Depression   HLD (hyperlipidemia)   Nicotine dependence, cigarettes, uncomplicated   Centrilobular emphysema (HCC)   Coronary artery disease involving native coronary artery of native heart without angina pectoris   Elevated troponin    Body mass index is 25.48 kg/m.   Acute exacerbation of severe COPD: Continue prednisone and bronchodilators.  Plan to complete IV azithromycin and IV cefepime today. Consider azithromycin 3 times a week in the outpatient setting Outpatient follow-up with Dr. Belia Heman, pulmonologist, on 08/09/2023   Acute on chronic hypoxemic respiratory failure: Pulmonologist recommended NIPPV at night.  Obtain ABG. She will need home oxygen and BiPAP at night Lucendia Herrlich, case manager, has been notified.   Elevated troponin: This is likely from demand ischemia. CAD: Continue aspirin, Brilinta and atorvastatin   Tobacco use disorder: Counseled to quit smoking cigarettes   Comorbidities include anxiety, depression, hypertension   Diet Order             Diet heart healthy/carb modified Fluid consistency: Thin  Diet effective now                            Consultants: Pulmonologist  Procedures: None    Medications:    aspirin EC  81 mg  Oral Daily   atorvastatin  80 mg Oral QHS   citalopram  30 mg Oral Daily   cyanocobalamin  1,000 mcg Oral Daily   ferrous sulfate  325 mg Oral QAC breakfast   fluticasone furoate-vilanterol  1 puff Inhalation Daily   And   umeclidinium bromide  1 puff Inhalation Daily   furosemide  20 mg Intravenous Daily   heparin  5,000 Units Subcutaneous Q8H   ipratropium-albuterol  3 mL Nebulization TID   losartan  25 mg Oral QPM   metoprolol succinate  12.5 mg Oral QPM   pantoprazole  40 mg Oral BID   predniSONE  40 mg Oral Q breakfast   ticagrelor  90 mg Oral BID   Continuous Infusions:  ceFEPime (MAXIPIME) IV 2 g (07/19/23 0418)     Anti-infectives (From admission, onward)    Start     Dose/Rate Route Frequency Ordered Stop   07/16/23 1000  azithromycin (ZITHROMAX) 500 mg in sodium chloride 0.9 % 250 mL IVPB        500 mg 250 mL/hr over 60 Minutes Intravenous Every 24 hours 07/15/23 1521 07/19/23 1050   07/16/23 1000  cefTRIAXone (ROCEPHIN) 2 g in sodium chloride 0.9 % 100 mL IVPB  Status:  Discontinued        2 g 200 mL/hr over 30 Minutes Intravenous Every 24 hours 07/15/23 1711 07/15/23 1726   07/15/23 1800  ceFEPIme (MAXIPIME) 2 g in sodium chloride 0.9 % 100 mL  IVPB        2 g 200 mL/hr over 30 Minutes Intravenous Every 12 hours 07/15/23 1738     07/15/23 1015  cefTRIAXone (ROCEPHIN) 2 g in sodium chloride 0.9 % 100 mL IVPB        2 g 200 mL/hr over 30 Minutes Intravenous Once 07/15/23 1002 07/15/23 1206   07/15/23 1015  azithromycin (ZITHROMAX) 500 mg in sodium chloride 0.9 % 250 mL IVPB        500 mg 250 mL/hr over 60 Minutes Intravenous  Once 07/15/23 1002 07/15/23 1206              Family Communication/Anticipated D/C date and plan/Code Status   DVT prophylaxis: heparin injection 5,000 Units Start: 07/15/23 2200 Place TED hose Start: 07/15/23 1520     Code Status: Limited: Do not attempt resuscitation (DNR) -DNR-LIMITED -Do Not Intubate/DNI   Family  Communication: None Disposition Plan: Plan to discharge to SNF   Status is: Inpatient Remains inpatient appropriate because: Awaiting placement to SNF       Subjective:   Interval events noted.  She feels short of breath with exertion but overall she is feeling better.  Objective:    Vitals:   07/18/23 2049 07/18/23 2105 07/19/23 0802 07/19/23 0816  BP:  (!) 156/92  (!) 147/82  Pulse:  80  72  Resp:  18  18  Temp:  98 F (36.7 C)  98.8 F (37.1 C)  TempSrc:      SpO2: 99% 100% 99% 98%  Weight:      Height:       No data found.   Intake/Output Summary (Last 24 hours) at 07/19/2023 1050 Last data filed at 07/18/2023 1549 Gross per 24 hour  Intake 120 ml  Output --  Net 120 ml   Filed Weights   07/15/23 0912  Weight: 55.3 kg    Exam:  GEN: NAD SKIN: Warm and dry EYES: No pallor or icterus ENT: MMM CV: RRR PULM: Decreased air entry bilaterally, bilateral expiratory wheezing ABD: soft, ND, NT, +BS CNS: AAO x 3, non focal EXT: No edema or tenderness        Data Reviewed:   I have personally reviewed following labs and imaging studies:  Labs: Labs show the following:   Basic Metabolic Panel: Recent Labs  Lab 07/15/23 0920 07/16/23 0445 07/17/23 0457 07/19/23 0535  NA 141 137 139 138  K 4.2 3.8 3.8 3.8  CL 110 106 105 105  CO2 22 23 28 26   GLUCOSE 121* 113* 86 76  BUN 13 11 17  24*  CREATININE 0.60 0.48 0.55 0.63  CALCIUM 8.2* 8.1* 8.1* 8.1*   GFR Estimated Creatinine Clearance: 47.6 mL/min (by C-G formula based on SCr of 0.63 mg/dL). Liver Function Tests: Recent Labs  Lab 07/15/23 0920  AST 34  ALT 30  ALKPHOS 55  BILITOT 0.5  PROT 6.1*  ALBUMIN 3.1*   No results for input(s): "LIPASE", "AMYLASE" in the last 168 hours. No results for input(s): "AMMONIA" in the last 168 hours. Coagulation profile No results for input(s): "INR", "PROTIME" in the last 168 hours.  CBC: Recent Labs  Lab 07/15/23 0920 07/16/23 0445  07/17/23 0457 07/19/23 0535  WBC 13.7* 9.4 10.0 8.6  NEUTROABS 10.4*  --   --   --   HGB 10.4* 8.6* 8.8* 8.7*  HCT 34.4* 27.0* 28.0* 27.1*  MCV 94.8 89.4 90.3 88.9  PLT 460* 343 384 367   Cardiac Enzymes: No  results for input(s): "CKTOTAL", "CKMB", "CKMBINDEX", "TROPONINI" in the last 168 hours. BNP (last 3 results) No results for input(s): "PROBNP" in the last 8760 hours. CBG: No results for input(s): "GLUCAP" in the last 168 hours. D-Dimer: No results for input(s): "DDIMER" in the last 72 hours. Hgb A1c: No results for input(s): "HGBA1C" in the last 72 hours. Lipid Profile: No results for input(s): "CHOL", "HDL", "LDLCALC", "TRIG", "CHOLHDL", "LDLDIRECT" in the last 72 hours. Thyroid function studies: No results for input(s): "TSH", "T4TOTAL", "T3FREE", "THYROIDAB" in the last 72 hours.  Invalid input(s): "FREET3" Anemia work up: No results for input(s): "VITAMINB12", "FOLATE", "FERRITIN", "TIBC", "IRON", "RETICCTPCT" in the last 72 hours. Sepsis Labs: Recent Labs  Lab 07/15/23 0920 07/15/23 1037 07/15/23 1207 07/16/23 0445 07/17/23 0457 07/19/23 0535  PROCALCITON  --   --  <0.10  --   --   --   WBC 13.7*  --   --  9.4 10.0 8.6  LATICACIDVEN  --  1.4  --   --   --   --     Microbiology Recent Results (from the past 240 hours)  Resp panel by RT-PCR (RSV, Flu A&B, Covid) Anterior Nasal Swab     Status: None   Collection Time: 07/15/23  9:20 AM   Specimen: Anterior Nasal Swab  Result Value Ref Range Status   SARS Coronavirus 2 by RT PCR NEGATIVE NEGATIVE Final    Comment: (NOTE) SARS-CoV-2 target nucleic acids are NOT DETECTED.  The SARS-CoV-2 RNA is generally detectable in upper respiratory specimens during the acute phase of infection. The lowest concentration of SARS-CoV-2 viral copies this assay can detect is 138 copies/mL. A negative result does not preclude SARS-Cov-2 infection and should not be used as the sole basis for treatment or other patient  management decisions. A negative result may occur with  improper specimen collection/handling, submission of specimen other than nasopharyngeal swab, presence of viral mutation(s) within the areas targeted by this assay, and inadequate number of viral copies(<138 copies/mL). A negative result must be combined with clinical observations, patient history, and epidemiological information. The expected result is Negative.  Fact Sheet for Patients:  BloggerCourse.com  Fact Sheet for Healthcare Providers:  SeriousBroker.it  This test is no t yet approved or cleared by the [place] FDA and  has been authorized for detection and/or diagnosis of SARS-CoV-2 by FDA under an Emergency Use Authorization (EUA). This EUA will remain  in effect (meaning this test can be used) for the duration of the COVID-19 declaration under Section 564(b)(1) of the Act, 21 U.S.C.section 360bbb-3(b)(1), unless the authorization is terminated  or revoked sooner.       Influenza A by PCR NEGATIVE NEGATIVE Final   Influenza B by PCR NEGATIVE NEGATIVE Final    Comment: (NOTE) The Xpert Xpress SARS-CoV-2/FLU/RSV plus assay is intended as an aid in the diagnosis of influenza from Nasopharyngeal swab specimens and should not be used as a sole basis for treatment. Nasal washings and aspirates are unacceptable for Xpert Xpress SARS-CoV-2/FLU/RSV testing.  Fact Sheet for Patients: BloggerCourse.com  Fact Sheet for Healthcare Providers: SeriousBroker.it  This test is not yet approved or cleared by the [place] FDA and has been authorized for detection and/or diagnosis of SARS-CoV-2 by FDA under an Emergency Use Authorization (EUA). This EUA will remain in effect (meaning this test can be used) for the duration of the COVID-19 declaration under Section 564(b)(1) of the Act, 21 U.S.C. section 360bbb-3(b)(1),  unless the authorization is terminated or  revoked.     Resp Syncytial Virus by PCR NEGATIVE NEGATIVE Final    Comment: (NOTE) Fact Sheet for Patients: BloggerCourse.com  Fact Sheet for Healthcare Providers: SeriousBroker.it  This test is not yet approved or cleared by the [place] FDA and has been authorized for detection and/or diagnosis of SARS-CoV-2 by FDA under an Emergency Use Authorization (EUA). This EUA will remain in effect (meaning this test can be used) for the duration of the COVID-19 declaration under Section 564(b)(1) of the Act, 21 U.S.C. section 360bbb-3(b)(1), unless the authorization is terminated or revoked.  Performed at [facility], [address] [place], [place] [address]   Blood culture (routine x 2)     Status: None (Preliminary result)   Collection Time: 07/15/23 10:37 AM   Specimen: BLOOD  Result Value Ref Range Status   Specimen Description BLOOD LEFT ANTECUBITAL  Final   Special Requests   Final    BOTTLES DRAWN AEROBIC AND ANAEROBIC Blood Culture results may not be optimal due to an inadequate volume of blood received in culture bottles   Culture   Final    NO GROWTH 4 DAYS Performed at [facility], [address], [place], [place] [address]    Report Status PENDING  Incomplete  Blood culture (routine x 2)     Status: None (Preliminary result)   Collection Time: 07/15/23 10:38 AM   Specimen: BLOOD  Result Value Ref Range Status   Specimen Description BLOOD BLOOD RIGHT WRIST  Final   Special Requests   Final    BOTTLES DRAWN AEROBIC AND ANAEROBIC Blood Culture results may not be optimal due to an inadequate volume of blood received in culture bottles   Culture   Final    NO GROWTH 4 DAYS Performed at [facility], [address] [place], [place] [address]    Report Status PENDING  Incomplete  Respiratory (~20 pathogens) panel by PCR     Status: None    Collection Time: 07/15/23  5:39 PM   Specimen: Nasopharyngeal Swab; Respiratory  Result Value Ref Range Status   Adenovirus NOT DETECTED NOT DETECTED Final   Coronavirus 229E NOT DETECTED NOT DETECTED Final    Comment: (NOTE) The Coronavirus on the Respiratory Panel, DOES NOT test for the novel  Coronavirus (2019 nCoV)    Coronavirus HKU1 NOT DETECTED NOT DETECTED Final   Coronavirus NL63 NOT DETECTED NOT DETECTED Final   Coronavirus OC43 NOT DETECTED NOT DETECTED Final   Metapneumovirus NOT DETECTED NOT DETECTED Final   Rhinovirus / Enterovirus NOT DETECTED NOT DETECTED Final   Influenza A NOT DETECTED NOT DETECTED Final   Influenza B NOT DETECTED NOT DETECTED Final   Parainfluenza Virus 1 NOT DETECTED NOT DETECTED Final   Parainfluenza Virus 2 NOT DETECTED NOT DETECTED Final   Parainfluenza Virus 3 NOT DETECTED NOT DETECTED Final   Parainfluenza Virus 4 NOT DETECTED NOT DETECTED Final   Respiratory Syncytial Virus NOT DETECTED NOT DETECTED Final   Bordetella pertussis NOT DETECTED NOT DETECTED Final   Bordetella Parapertussis NOT DETECTED NOT DETECTED Final   Chlamydophila pneumoniae NOT DETECTED NOT DETECTED Final   Mycoplasma pneumoniae NOT DETECTED NOT DETECTED Final    Comment: Performed at [facility], [address] [place], [place] [address]  MRSA Next Gen by PCR, Nasal     Status: None   Collection Time: 07/15/23  5:39 PM   Specimen: Nasopharyngeal Swab; Nasal Swab  Result Value Ref Range Status  MRSA by PCR Next Gen NOT DETECTED NOT DETECTED Final    Comment: (NOTE) The GeneXpert MRSA Assay (FDA approved for NASAL specimens only), is one component of a comprehensive MRSA colonization surveillance program. It is not intended to diagnose MRSA infection nor to guide or monitor treatment for MRSA infections. Test performance is not FDA approved in patients less than 42 years old. Performed at [facility], [address] [place], [place] [address]      Procedures and diagnostic studies:  No results found.             LOS: 4 days   Paiden Cavell  [facility]   Pager on [email or web address]. If 7PM-7AM, please contact night-coverage at [email or web address]     07/19/2023, 10:50 AM

## 2023-07-19 NOTE — Care Management Important Message (Signed)
Important Message  Patient Details  Name: Terri Burns MRN: [id number] Date of Birth: 1950/10/21   Important Message Given:  Yes - Medicare IM     Cristela Blue, CMA 07/19/2023, 11:06 AM

## 2023-07-19 NOTE — Progress Notes (Signed)
Physical Therapy Treatment Patient Details Name: Terri Burns MRN: [id number] DOB: Feb 27, 1951 Today's Date: 07/19/2023   History of Present Illness Pt is a 73 y.o. female presenting to [facility] ED with increased SOB, workup for COPD exacerbation. PMH significant for chronic HFpEF, CAD status post stenting on Brilinta, iron deficiency anemia, anxiety/depression, GERD, HTN, & HLD, COPD.    PT Comments  Patient supine in bed upon arrival, agreeable to PT tx session. Patient requesting to use bathroom. Patient able to complete bed mobility MOD I with HOB slightly elevated and use of rail. Patient able to complete STS from EOB with supervision, and stand step transfer from bed > BSC with CGA/Handheld assist. Pt was Mod I with pericare. Able to ambulate from [facility] to recliner, but limited ambulation distance this date due to SOB/fatigue. Pt SOB improved with breathing, and sp02 maintain >90% on 2L throughout session. Patient will continue to benefit from skilled acute PT services while admitted. Will continue to follow acutely.    If plan is discharge home, recommend the following: A little help with walking and/or transfers;A little help with bathing/dressing/bathroom;Assist for transportation;Help with stairs or ramp for entrance;Assistance with cooking/housework   Can travel by private vehicle     No  Equipment Recommendations  None recommended by PT    Recommendations for Other Services       Precautions / Restrictions Precautions Precautions: Fall Restrictions Weight Bearing Restrictions Per Provider Order: No     Mobility  Bed Mobility Overal bed mobility: Needs Assistance Bed Mobility: Supine to Sit     Supine to sit: Supervision, HOB elevated, Used rails     General bed mobility comments: able to complete supine > sit with supervision, use of rails and HOB slightly elevated.    Transfers Overall transfer level: Needs assistance Equipment used: None Transfers: Sit to/from  Stand, Bed to chair/wheelchair/BSC Sit to Stand: Supervision   Step pivot transfers: Contact guard assist       General transfer comment: Patient able to stand without RW, supv. CGA for stand step transfer from bed > BSC. Patient MOD I with toilet hygiene.    Ambulation/Gait Ambulation/Gait assistance: Supervision, Contact guard assist Gait Distance (Feet): 5 Feet Assistive device: Rolling walker (2 wheels)   Gait velocity: Decreased     General Gait Details: steady with RW; ambulation distance limited due to fatigue after using bathroom. ABle to ambulate from [facility] to recliner with supv/CGA.   Stairs             Wheelchair Mobility     Tilt Bed    Modified Rankin (Stroke Patients Only)       Balance Overall balance assessment: Needs assistance Sitting-balance support: Feet supported, No upper extremity supported Sitting balance-Leahy Scale: Good     Standing balance support: Bilateral upper extremity supported, During functional activity, Reliant on assistive device for balance Standing balance-Leahy Scale: Fair Standing balance comment: improved steadiness with use of RW                            Communication Communication Communication: No apparent difficulties  Cognition Arousal: Alert Behavior During Therapy: WFL for tasks assessed/performed   PT - Cognitive impairments: No family/caregiver present to determine baseline                                Cueing    Exercises Other Exercises  Other Exercises: Pt educated/encouraged to utilize PLB to recover from SOB    General Comments General comments (skin integrity, edema, etc.): Pt on 2L supplemental oxygen, sp02 maintain > 90% with mobility      Pertinent Vitals/Pain Pain Assessment Pain Assessment: No/denies pain    Home Living                          Prior Function            PT Goals (current goals can now be found in the care plan section) Acute  Rehab PT Goals Patient Stated Goal: to breathe better PT Goal Formulation: With patient Time For Goal Achievement: 07/31/23 Potential to Achieve Goals: Good Progress towards PT goals: Progressing toward goals    Frequency    Min 1X/week      PT Plan      Co-evaluation              AM-PAC PT "6 Clicks" Mobility   Outcome Measure  Help needed turning from your back to your side while in a flat bed without using bedrails?: A Little Help needed moving from lying on your back to sitting on the side of a flat bed without using bedrails?: A Little Help needed moving to and from a bed to a chair (including a wheelchair)?: A Little Help needed standing up from a chair using your arms (e.g., wheelchair or bedside chair)?: A Little Help needed to walk in hospital room?: A Little Help needed climbing 3-5 steps with a railing? : A Lot 6 Click Score: 17    End of Session Equipment Utilized During Treatment: Oxygen Activity Tolerance: Patient tolerated treatment well Patient left: with call bell/phone within reach;in chair;with chair alarm set Nurse Communication: Mobility status PT Visit Diagnosis: Muscle weakness (generalized) (M62.81);Unsteadiness on feet (R26.81);Difficulty in walking, not elsewhere classified (R26.2)     Time: 6045-4098 PT Time Calculation (min) (ACUTE ONLY): 14 min  Charges:    $Therapeutic Activity: 8-22 mins PT General Charges $$ ACUTE PT VISIT: 1 Visit                     Creed Copper Fairly, PT, DPT 07/19/23 2:29 PM

## 2023-07-19 NOTE — [place] FL2 (Signed)
[place] MEDICAID FL2 LEVEL OF CARE FORM     IDENTIFICATION  Patient Name: Terri Burns Birthdate: 08-24-1950 Sex: female Admission Date (Current Location): 07/15/2023  [place] and [place] Number:  [address] [id number] [facility] Facility and Address:  [facility], [address], [place], [place] [address]      Provider Number: [id number]  Attending Physician Name and Address:  Lurene Shadow, MD  Relative Name and Phone Number:  Ramond Craver [phone]    Current Level of Care: Hospital Recommended Level of Care: Skilled Nursing Facility Prior Approval Number:    Date Approved/Denied:   PASRR Number: [id number]  Discharge Plan: SNF    Current Diagnoses: Patient Active Problem List   Diagnosis Date Noted   Acute hypoxic on chronic hypercapnic respiratory failure (HCC) 07/15/2023   Elevated troponin 07/15/2023   Polyp of ascending colon 07/07/2023   Duodenal ulcer 07/07/2023   Lower GI bleed 07/04/2023   Acute on chronic diastolic CHF (congestive heart failure) (HCC) 07/02/2023   GI bleed 07/02/2023   Right lower lobe pneumonia 06/09/2023   Acute respiratory failure with hypoxia (HCC) 06/09/2023   Coronary artery disease 06/09/2023   Pulmonary nodule 06/09/2023   Acute hypoxic respiratory failure (HCC) 06/09/2023   Community acquired pneumonia of right lower lobe of lung 06/09/2023   CAP (community acquired pneumonia) 06/09/2023   COPD exacerbation (HCC) 06/08/2023   Low vitamin B12 level 05/20/2023   Microcytic anemia 05/20/2023   Iron deficiency anemia 05/19/2023   Acute exacerbation of bronchiectasis (HCC) 05/17/2023   Pneumonia of right upper lobe due to Streptococcus pneumoniae (HCC) 05/17/2023   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) 01/05/2022   Anxiety and depression 01/05/2022   Dyslipidemia 01/05/2022   Coronary artery disease involving native coronary artery of native heart without angina pectoris 01/05/2022   GERD without  esophagitis 01/05/2022   Acute hypoxemic respiratory failure (HCC) 01/05/2022   Centrilobular emphysema (HCC) 07/13/2021   Nicotine dependence, cigarettes, uncomplicated 04/13/2021   History of non-ST elevation myocardial infarction (NSTEMI)    Depression 09/13/2020   HLD (hyperlipidemia) 09/13/2020   Atherosclerosis of aorta (HCC) 03/04/2018   Coronary artery calcification of native artery 03/04/2018   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   Chronic neck pain 01/01/2018   Benign essential HTN 01/01/2018   Senile purpura (HCC) 01/01/2018   Hyperglycemia 09/27/2016   Allergic rhinitis, seasonal 05/12/2015   Degeneration of intervertebral disc of cervical region 05/12/2015   GERD (gastroesophageal reflux disease) 05/12/2015   Major depression in remission (HCC) 05/12/2015    Orientation RESPIRATION BLADDER Height & Weight     Self, Time, Situation, Place  O2 (2L per [place]) Continent Weight: 55.3 kg Height:  4\' 10"  (147.3 cm)  BEHAVIORAL SYMPTOMS/MOOD NEUROLOGICAL BOWEL NUTRITION STATUS      Continent  (See Discharge Summary)  AMBULATORY STATUS COMMUNICATION OF NEEDS Skin   Extensive Assist Verbally Normal                       Personal Care Assistance Level of Assistance  Bathing, Feeding, Dressing Bathing Assistance: Maximum assistance Feeding assistance: Limited assistance Dressing Assistance: Maximum assistance     Functional Limitations Info  Sight, Hearing, Speech Sight Info: Adequate Hearing Info: Adequate Speech Info: Adequate    SPECIAL CARE FACTORS FREQUENCY  PT (By licensed PT), OT (By licensed OT)     PT Frequency: 5x weekly OT Frequency: 5x weekly  Contractures Contractures Info: Not present    Additional Factors Info  Code Status, Allergies Code Status Info: DNR-Limited Allergies Info: Levofloxacin, Penicillins, Meloxicam, Nsaids           Current Medications (07/19/2023):  This is the current hospital active  medication list Current Facility-Administered Medications  Medication Dose Route Frequency Provider Last Rate Last Admin   acetaminophen (TYLENOL) tablet 650 mg  650 mg Oral Q6H PRN Cox, Amy N, DO       Or   acetaminophen (TYLENOL) suppository 650 mg  650 mg Rectal Q6H PRN Cox, Amy N, DO       albuterol (PROVENTIL) (2.5 MG/3ML) 0.083% nebulizer solution 2.5 mg  2.5 mg Nebulization Q4H PRN Cox, Amy N, DO       aspirin EC tablet 81 mg  81 mg Oral Daily Cox, Amy N, DO   81 mg at 07/18/23 0938   atorvastatin (LIPITOR) tablet 80 mg  80 mg Oral QHS Cox, Amy N, DO   80 mg at 07/18/23 2214   azithromycin (ZITHROMAX) 500 mg in sodium chloride 0.9 % 250 mL IVPB  500 mg Intravenous Q24H Cox, Amy N, DO 250 mL/hr at 07/18/23 0957 500 mg at 07/18/23 0957   ceFEPIme (MAXIPIME) 2 g in sodium chloride 0.9 % 100 mL IVPB  2 g Intravenous Q12H Cox, Amy N, DO 200 mL/hr at 07/19/23 0418 2 g at 07/19/23 0418   citalopram (CELEXA) tablet 30 mg  30 mg Oral Daily Cox, Amy N, DO   30 mg at 07/18/23 1610   cyanocobalamin (VITAMIN B12) tablet 1,000 mcg  1,000 mcg Oral Daily Cox, Amy N, DO   1,000 mcg at 07/18/23 9604   ferrous sulfate tablet 325 mg  325 mg Oral QAC breakfast Cox, Amy N, DO   325 mg at 07/18/23 0938   fluticasone (FLONASE) 50 MCG/ACT nasal spray 2 spray  2 spray Each Nare Daily PRN Cox, Amy N, DO       fluticasone furoate-vilanterol (BREO ELLIPTA) 100-25 MCG/ACT 1 puff  1 puff Inhalation Daily Cox, Amy N, DO   1 puff at 07/18/23 5409   And   umeclidinium bromide (INCRUSE ELLIPTA) 62.5 MCG/ACT 1 puff  1 puff Inhalation Daily Cox, Amy N, DO   1 puff at 07/18/23 0937   furosemide (LASIX) injection 20 mg  20 mg Intravenous Daily Marcelino Duster, MD   20 mg at 07/18/23 0939   heparin injection 5,000 Units  5,000 Units Subcutaneous Q8H Cox, Amy N, DO   5,000 Units at 07/19/23 0418   hydrALAZINE (APRESOLINE) injection 5 mg  5 mg Intravenous Q6H PRN Cox, Amy N, DO   5 mg at 07/17/23 0028    HYDROcodone-acetaminophen (NORCO) 10-325 MG per tablet 1 tablet  1 tablet Oral Q4H PRN Cox, Amy N, DO   1 tablet at 07/19/23 0413   ipratropium-albuterol (DUONEB) 0.5-2.5 (3) MG/3ML nebulizer solution 3 mL  3 mL Nebulization TID Marcelino Duster, MD   3 mL at 07/19/23 0801   losartan (COZAAR) tablet 25 mg  25 mg Oral QPM Cox, Amy N, DO   25 mg at 07/18/23 1743   metoprolol succinate (TOPROL-XL) 24 hr tablet 12.5 mg  12.5 mg Oral QPM Cox, Amy N, DO   12.5 mg at 07/18/23 1743   nicotine (NICODERM CQ - dosed in mg/24 hours) patch 21 mg  21 mg Transdermal Daily PRN Cox, Amy N, DO       ondansetron (ZOFRAN) tablet 4 mg  4 mg Oral Q6H PRN Cox, Amy N, DO       Or   ondansetron (ZOFRAN) injection 4 mg  4 mg Intravenous Q6H PRN Cox, Amy N, DO       pantoprazole (PROTONIX) EC tablet 40 mg  40 mg Oral BID Cox, Amy N, DO   40 mg at 07/18/23 2214   predniSONE (DELTASONE) tablet 40 mg  40 mg Oral Q breakfast Cox, Amy N, DO   40 mg at 07/18/23 1610   senna-docusate (Senokot-S) tablet 1 tablet  1 tablet Oral QHS PRN Cox, Amy N, DO   1 tablet at 07/19/23 0413   ticagrelor (BRILINTA) tablet 90 mg  90 mg Oral BID Cox, Amy N, DO   90 mg at 07/18/23 2214     Discharge Medications: Please see discharge summary for a list of discharge medications.  Relevant Imaging Results:  Relevant Lab Results:   Additional Information [id number]  Garret Reddish, RN

## 2023-07-20 ENCOUNTER — Institutional Professional Consult (permissible substitution): Payer: 59 | Admitting: Internal Medicine

## 2023-07-20 DIAGNOSIS — J9601 Acute respiratory failure with hypoxia: Secondary | ICD-10-CM | POA: Diagnosis not present

## 2023-07-20 DIAGNOSIS — J9612 Chronic respiratory failure with hypercapnia: Secondary | ICD-10-CM | POA: Diagnosis not present

## 2023-07-20 LAB — CULTURE, BLOOD (ROUTINE X 2)
Culture: NO GROWTH
Culture: NO GROWTH

## 2023-07-20 NOTE — Progress Notes (Signed)
Progress Note    Terri PERCIVAL  Burns:[id number] DOB: 26-Mar-1951  DOA: 07/15/2023 PCP: Louis Matte, MD      Brief Narrative:    Medical records reviewed and are as summarized below:  Terri Burns is a 73 y.o. female with history of severe COPD, hyperlipidemia, depression, anxiety, hypertension, GERD, presented to the hospital with shortness of breath.    Assessment/Plan:   Principal Problem:   Acute hypoxic on chronic hypercapnic respiratory failure (HCC) Active Problems:   COPD exacerbation (HCC)   Acute on chronic diastolic CHF (congestive heart failure) (HCC)   Anxiety and depression   GERD (gastroesophageal reflux disease)   Benign essential HTN   Senile purpura (HCC)   Depression   HLD (hyperlipidemia)   Nicotine dependence, cigarettes, uncomplicated   Centrilobular emphysema (HCC)   Coronary artery disease involving native coronary artery of native heart without angina pectoris   Elevated troponin    Body mass index is 25.48 kg/m.   Acute exacerbation of severe COPD: Continue bronchodilators.   Completed 5 days of steroids, IV azithromycin and IV cefepime on 07/19/2023.   Outpatient follow-up with Dr. Belia Heman, pulmonologist, on 08/09/2023   Acute on chronic hypoxemic respiratory failure:  ABG showed pH of 7.4, pCO2 43, pO2 111, bicarb of 26.6, O2 sat 98.4% on 2 L of oxygen. Unfortunately ABG results will not qualify her for home BiPAP. Dr. Aundria Rud, pulmonologist, was updated   Elevated troponin: This is likely from demand ischemia. CAD: Continue aspirin, Brilinta and atorvastatin   Tobacco use disorder: Continue nicotine patch.  Counseled to quit smoking cigarettes   Comorbidities include anxiety, depression, hypertension   Diet Order             Diet heart healthy/carb modified Fluid consistency: Thin  Diet effective now                            Consultants: Pulmonologist  Procedures: None    Medications:     aspirin EC  81 mg Oral Daily   atorvastatin  80 mg Oral QHS   citalopram  30 mg Oral Daily   cyanocobalamin  1,000 mcg Oral Daily   ferrous sulfate  325 mg Oral QAC breakfast   fluticasone furoate-vilanterol  1 puff Inhalation Daily   And   umeclidinium bromide  1 puff Inhalation Daily   furosemide  20 mg Intravenous Daily   heparin  5,000 Units Subcutaneous Q8H   ipratropium-albuterol  3 mL Nebulization TID   losartan  25 mg Oral QPM   metoprolol succinate  12.5 mg Oral QPM   pantoprazole  40 mg Oral BID   ticagrelor  90 mg Oral BID   Continuous Infusions:     Anti-infectives (From admission, onward)    Start     Dose/Rate Route Frequency Ordered Stop   07/16/23 1000  azithromycin (ZITHROMAX) 500 mg in sodium chloride 0.9 % 250 mL IVPB        500 mg 250 mL/hr over 60 Minutes Intravenous Every 24 hours 07/15/23 1521 07/19/23 1150   07/16/23 1000  cefTRIAXone (ROCEPHIN) 2 g in sodium chloride 0.9 % 100 mL IVPB  Status:  Discontinued        2 g 200 mL/hr over 30 Minutes Intravenous Every 24 hours 07/15/23 1711 07/15/23 1726   07/15/23 1800  ceFEPIme (MAXIPIME) 2 g in sodium chloride 0.9 % 100 mL IVPB  Status:  Discontinued  2 g 200 mL/hr over 30 Minutes Intravenous Every 12 hours 07/15/23 1738 07/20/23 1554   07/15/23 1015  cefTRIAXone (ROCEPHIN) 2 g in sodium chloride 0.9 % 100 mL IVPB        2 g 200 mL/hr over 30 Minutes Intravenous Once 07/15/23 1002 07/15/23 1206   07/15/23 1015  azithromycin (ZITHROMAX) 500 mg in sodium chloride 0.9 % 250 mL IVPB        500 mg 250 mL/hr over 60 Minutes Intravenous  Once 07/15/23 1002 07/15/23 1206              Family Communication/Anticipated D/C date and plan/Code Status   DVT prophylaxis: heparin injection 5,000 Units Start: 07/15/23 2200 Place TED hose Start: 07/15/23 1520     Code Status: Limited: Do not attempt resuscitation (DNR) -DNR-LIMITED -Do Not Intubate/DNI   Family Communication: None Disposition  Plan: Plan to discharge to SNF   Status is: Inpatient Remains inpatient appropriate because: Awaiting placement to SNF       Subjective:   No acute events noted overnight.  No new complaints.  Breathing is a little better.  Objective:    Vitals:   07/20/23 0748 07/20/23 0751 07/20/23 1441 07/20/23 1542  BP: (!) 149/77   122/79  Pulse: 63   65  Resp: 18   17  Temp: 98.1 F (36.7 C)   98.4 F (36.9 C)  TempSrc:      SpO2: 99% 98% 100% 100%  Weight:      Height:       No data found.   Intake/Output Summary (Last 24 hours) at 07/20/2023 1554 Last data filed at 07/20/2023 1438 Gross per 24 hour  Intake 720 ml  Output --  Net 720 ml   Filed Weights   07/15/23 0912  Weight: 55.3 kg    Exam:  GEN: NAD SKIN: Warm and dry EYES: No pallor or icterus ENT: MMM CV: RRR PULM: Bilateral expiratory wheezing ABD: soft, ND, NT, +BS CNS: AAO x 3, non focal EXT: No edema or tenderness      Data Reviewed:   I have personally reviewed following labs and imaging studies:  Labs: Labs show the following:   Basic Metabolic Panel: Recent Labs  Lab 07/15/23 0920 07/16/23 0445 07/17/23 0457 07/19/23 0535  NA 141 137 139 138  K 4.2 3.8 3.8 3.8  CL 110 106 105 105  CO2 22 23 28 26   GLUCOSE 121* 113* 86 76  BUN 13 11 17  24*  CREATININE 0.60 0.48 0.55 0.63  CALCIUM 8.2* 8.1* 8.1* 8.1*   GFR Estimated Creatinine Clearance: 47.6 mL/min (by C-G formula based on SCr of 0.63 mg/dL). Liver Function Tests: Recent Labs  Lab 07/15/23 0920  AST 34  ALT 30  ALKPHOS 55  BILITOT 0.5  PROT 6.1*  ALBUMIN 3.1*   No results for input(s): "LIPASE", "AMYLASE" in the last 168 hours. No results for input(s): "AMMONIA" in the last 168 hours. Coagulation profile No results for input(s): "INR", "PROTIME" in the last 168 hours.  CBC: Recent Labs  Lab 07/15/23 0920 07/16/23 0445 07/17/23 0457 07/19/23 0535  WBC 13.7* 9.4 10.0 8.6  NEUTROABS 10.4*  --   --   --   HGB  10.4* 8.6* 8.8* 8.7*  HCT 34.4* 27.0* 28.0* 27.1*  MCV 94.8 89.4 90.3 88.9  PLT 460* 343 384 367   Cardiac Enzymes: No results for input(s): "CKTOTAL", "CKMB", "CKMBINDEX", "TROPONINI" in the last 168 hours. BNP (last 3 results) No  results for input(s): "PROBNP" in the last 8760 hours. CBG: No results for input(s): "GLUCAP" in the last 168 hours. D-Dimer: No results for input(s): "DDIMER" in the last 72 hours. Hgb A1c: No results for input(s): "HGBA1C" in the last 72 hours. Lipid Profile: No results for input(s): "CHOL", "HDL", "LDLCALC", "TRIG", "CHOLHDL", "LDLDIRECT" in the last 72 hours. Thyroid function studies: No results for input(s): "TSH", "T4TOTAL", "T3FREE", "THYROIDAB" in the last 72 hours.  Invalid input(s): "FREET3" Anemia work up: No results for input(s): "VITAMINB12", "FOLATE", "FERRITIN", "TIBC", "IRON", "RETICCTPCT" in the last 72 hours. Sepsis Labs: Recent Labs  Lab 07/15/23 0920 07/15/23 1037 07/15/23 1207 07/16/23 0445 07/17/23 0457 07/19/23 0535  PROCALCITON  --   --  <0.10  --   --   --   WBC 13.7*  --   --  9.4 10.0 8.6  LATICACIDVEN  --  1.4  --   --   --   --     Microbiology Recent Results (from the past 240 hours)  Resp panel by RT-PCR (RSV, Flu A&B, Covid) Anterior Nasal Swab     Status: None   Collection Time: 07/15/23  9:20 AM   Specimen: Anterior Nasal Swab  Result Value Ref Range Status   SARS Coronavirus 2 by RT PCR NEGATIVE NEGATIVE Final    Comment: (NOTE) SARS-CoV-2 target nucleic acids are NOT DETECTED.  The SARS-CoV-2 RNA is generally detectable in upper respiratory specimens during the acute phase of infection. The lowest concentration of SARS-CoV-2 viral copies this assay can detect is 138 copies/mL. A negative result does not preclude SARS-Cov-2 infection and should not be used as the sole basis for treatment or other patient management decisions. A negative result may occur with  improper specimen collection/handling,  submission of specimen other than nasopharyngeal swab, presence of viral mutation(s) within the areas targeted by this assay, and inadequate number of viral copies(<138 copies/mL). A negative result must be combined with clinical observations, patient history, and epidemiological information. The expected result is Negative.  Fact Sheet for Patients:  BloggerCourse.com  Fact Sheet for Healthcare Providers:  SeriousBroker.it  This test is no t yet approved or cleared by the [place] FDA and  has been authorized for detection and/or diagnosis of SARS-CoV-2 by FDA under an Emergency Use Authorization (EUA). This EUA will remain  in effect (meaning this test can be used) for the duration of the COVID-19 declaration under Section 564(b)(1) of the Act, 21 U.S.C.section 360bbb-3(b)(1), unless the authorization is terminated  or revoked sooner.       Influenza A by PCR NEGATIVE NEGATIVE Final   Influenza B by PCR NEGATIVE NEGATIVE Final    Comment: (NOTE) The Xpert Xpress SARS-CoV-2/FLU/RSV plus assay is intended as an aid in the diagnosis of influenza from Nasopharyngeal swab specimens and should not be used as a sole basis for treatment. Nasal washings and aspirates are unacceptable for Xpert Xpress SARS-CoV-2/FLU/RSV testing.  Fact Sheet for Patients: BloggerCourse.com  Fact Sheet for Healthcare Providers: SeriousBroker.it  This test is not yet approved or cleared by the [place] FDA and has been authorized for detection and/or diagnosis of SARS-CoV-2 by FDA under an Emergency Use Authorization (EUA). This EUA will remain in effect (meaning this test can be used) for the duration of the COVID-19 declaration under Section 564(b)(1) of the Act, 21 U.S.C. section 360bbb-3(b)(1), unless the authorization is terminated or revoked.     Resp Syncytial Virus by PCR NEGATIVE  NEGATIVE Final    Comment: (  NOTE) Fact Sheet for Patients: BloggerCourse.com  Fact Sheet for Healthcare Providers: SeriousBroker.it  This test is not yet approved or cleared by the [place] FDA and has been authorized for detection and/or diagnosis of SARS-CoV-2 by FDA under an Emergency Use Authorization (EUA). This EUA will remain in effect (meaning this test can be used) for the duration of the COVID-19 declaration under Section 564(b)(1) of the Act, 21 U.S.C. section 360bbb-3(b)(1), unless the authorization is terminated or revoked.  Performed at [facility], [address] [place], [place] [address]   Blood culture (routine x 2)     Status: None   Collection Time: 07/15/23 10:37 AM   Specimen: BLOOD  Result Value Ref Range Status   Specimen Description BLOOD LEFT ANTECUBITAL  Final   Special Requests   Final    BOTTLES DRAWN AEROBIC AND ANAEROBIC Blood Culture results may not be optimal due to an inadequate volume of blood received in culture bottles   Culture   Final    NO GROWTH 5 DAYS Performed at [facility], [address] [place], [place] [address]    Report Status 07/20/2023 FINAL  Final  Blood culture (routine x 2)     Status: None   Collection Time: 07/15/23 10:38 AM   Specimen: BLOOD  Result Value Ref Range Status   Specimen Description BLOOD BLOOD RIGHT WRIST  Final   Special Requests   Final    BOTTLES DRAWN AEROBIC AND ANAEROBIC Blood Culture results may not be optimal due to an inadequate volume of blood received in culture bottles   Culture   Final    NO GROWTH 5 DAYS Performed at [facility], [address] [place], [place] [address]    Report Status 07/20/2023 FINAL  Final  Respiratory (~20 pathogens) panel by PCR     Status: None   Collection Time: 07/15/23  5:39 PM   Specimen: Nasopharyngeal Swab; Respiratory  Result Value Ref Range Status   Adenovirus NOT  DETECTED NOT DETECTED Final   Coronavirus 229E NOT DETECTED NOT DETECTED Final    Comment: (NOTE) The Coronavirus on the Respiratory Panel, DOES NOT test for the novel  Coronavirus (2019 nCoV)    Coronavirus HKU1 NOT DETECTED NOT DETECTED Final   Coronavirus NL63 NOT DETECTED NOT DETECTED Final   Coronavirus OC43 NOT DETECTED NOT DETECTED Final   Metapneumovirus NOT DETECTED NOT DETECTED Final   Rhinovirus / Enterovirus NOT DETECTED NOT DETECTED Final   Influenza A NOT DETECTED NOT DETECTED Final   Influenza B NOT DETECTED NOT DETECTED Final   Parainfluenza Virus 1 NOT DETECTED NOT DETECTED Final   Parainfluenza Virus 2 NOT DETECTED NOT DETECTED Final   Parainfluenza Virus 3 NOT DETECTED NOT DETECTED Final   Parainfluenza Virus 4 NOT DETECTED NOT DETECTED Final   Respiratory Syncytial Virus NOT DETECTED NOT DETECTED Final   Bordetella pertussis NOT DETECTED NOT DETECTED Final   Bordetella Parapertussis NOT DETECTED NOT DETECTED Final   Chlamydophila pneumoniae NOT DETECTED NOT DETECTED Final   Mycoplasma pneumoniae NOT DETECTED NOT DETECTED Final    Comment: Performed at [facility], [address] [place], [place] [address]  MRSA Next Gen by PCR, Nasal     Status: None   Collection Time: 07/15/23  5:39 PM   Specimen: Nasopharyngeal Swab; Nasal Swab  Result Value Ref Range Status   MRSA by PCR Next Gen NOT DETECTED NOT DETECTED Final    Comment: (NOTE) The GeneXpert MRSA Assay (  FDA approved for NASAL specimens only), is one component of a comprehensive MRSA colonization surveillance program. It is not intended to diagnose MRSA infection nor to guide or monitor treatment for MRSA infections. Test performance is not FDA approved in patients less than 2 years old. Performed at [facility], [address] [place], [place] [address]     Procedures and diagnostic studies:  No results found.             LOS: 5 days   Temitope Griffing  [facility]   Pager on [email or web address]. If 7PM-7AM, please contact night-coverage at [email or web address]     07/20/2023, 3:54 PM

## 2023-07-20 NOTE — Plan of Care (Signed)
  Problem: Education: Goal: Knowledge of General Education information will improve Description: Including pain rating scale, medication(s)/side effects and non-pharmacologic comfort measures Outcome: Progressing   Problem: Clinical Measurements: Goal: Will remain free from infection Outcome: Progressing Goal: Cardiovascular complication will be avoided Outcome: Progressing   Problem: Activity: Goal: Risk for activity intolerance will decrease Outcome: Progressing   Problem: Elimination: Goal: Will not experience complications related to bowel motility Outcome: Progressing Goal: Will not experience complications related to urinary retention Outcome: Progressing   Problem: Clinical Measurements: Goal: Respiratory complications will improve Outcome: Not Progressing   Problem: Pain Managment: Goal: General experience of comfort will improve and/or be controlled Outcome: Not Progressing

## 2023-07-20 NOTE — Progress Notes (Signed)
Pt continues to tolerate short distance gait in room, appears safer with support of RW which also helps with energy conservation. Despite DOE, SpO2 remain in upper 90's on baseline 2L O2. Upon returning to chair pt given an Acapella to assist with airway clearance. Good demonstration of use after providing education. Will continue to progress acutely.   07/18/23 1600  PT Visit Information  Assistance Needed +1  History of Present Illness Pt is a 73 y.o. female presenting to [facility] ED with increased SOB, workup for COPD exacerbation. PMH significant for chronic HFpEF, CAD status post stenting on Brilinta, iron deficiency anemia, anxiety/depression, GERD, HTN, & HLD, COPD.  Subjective Data  Patient Stated Goal to breathe better  Precautions  Precautions Fall  Precaution/Restrictions Comments watch O2  Restrictions  Weight Bearing Restrictions Per Provider Order No  Pain Assessment  Pain Assessment No/denies pain  Cognition  Arousal Alert  Behavior During Therapy WFL for tasks assessed/performed  PT - Cognitive impairments No family/caregiver present to determine baseline  PT - Cognition Comments Pt able to follow multi-task commands consistently  Following Commands  Following commands Intact  Cueing  Cueing Techniques Verbal cues  Communication  Communication No apparent difficulties  Bed Mobility  General bed mobility comments Pt up in chair pre/post session  Transfers  Overall transfer level Needs assistance  Equipment used Rolling walker (2 wheels);None  Transfers Sit to/from Stand;Bed to chair/wheelchair/BSC  Sit to Stand Supervision  Step pivot transfers Contact guard assist  General transfer comment RW used for balance upon initial transfer  Ambulation/Gait  Ambulation/Gait assistance Supervision  Gait Distance (Feet) 30 Feet  Assistive device Rolling walker (2 wheels)  Gait Pattern/deviations Step-through pattern;Decreased step length - right;Decreased step length - left   General Gait Details Steady with use of RW, quick to fatigue  Gait velocity decr  Balance  Overall balance assessment Needs assistance  Sitting-balance support Feet supported;No upper extremity supported  Sitting balance-Leahy Scale Good  Standing balance support Bilateral upper extremity supported;During functional activity;Reliant on assistive device for balance  Standing balance-Leahy Scale Fair  Standing balance comment standing balance in RW  General Comments  General comments (skin integrity, edema, etc.) Pt educated on role of PT and benefits of increasing functional mobility. Also issued and educated pt on proper use of Acapella for pulmonary toileting.  PT - End of Session  Equipment Utilized During Treatment Oxygen  Activity Tolerance Patient tolerated treatment well  Patient left with call bell/phone within reach;in chair;with chair alarm set  Nurse Communication Mobility status   PT - Assessment/Plan  PT Visit Diagnosis Muscle weakness (generalized) (M62.81);Unsteadiness on feet (R26.81);Difficulty in walking, not elsewhere classified (R26.2)  PT Frequency (ACUTE ONLY) Min 1X/week  Follow Up Recommendations Skilled nursing-short term rehab (<3 hours/day)  Can patient physically be transported by private vehicle No  Patient can return home with the following A little help with walking and/or transfers;A little help with bathing/dressing/bathroom;Assist for transportation;Help with stairs or ramp for entrance;Assistance with cooking/housework  PT equipment None recommended by PT  AM-PAC PT "6 Clicks" Mobility Outcome Measure (Version 2)  Help needed turning from your back to your side while in a flat bed without using bedrails? 3  Help needed moving from lying on your back to sitting on the side of a flat bed without using bedrails? 3  Help needed moving to and from a bed to a chair (including a wheelchair)? 3  Help needed standing up from a chair using your arms (e.g.,  wheelchair  or bedside chair)? 3  Help needed to walk in hospital room? 2  Help needed climbing 3-5 steps with a railing?  2  6 Click Score 16  Consider Recommendation of Discharge To: [facility]  Progressive Mobility  What is the highest level of mobility based on the progressive mobility assessment? Level 4 (Walks with assist in room) - Balance while marching in place and cannot step forward and back - Complete  Activity Ambulated with assistance in room  PT Goal Progression  Progress towards PT goals Progressing toward goals  PT Time Calculation  PT Start Time (ACUTE ONLY) 1540  PT Stop Time (ACUTE ONLY) 1555  PT Time Calculation (min) (ACUTE ONLY) 15 min  PT Treatments  $Therapeutic Activity 8-22 mins   Zadie Cleverly, PTA

## 2023-07-20 NOTE — Plan of Care (Signed)

## 2023-07-20 NOTE — Progress Notes (Addendum)
Physical Therapy Treatment Patient Details Name: SALONI LABLANC MRN: [id number] DOB: 1950/07/30 Today's Date: 07/20/2023   History of Present Illness Pt is a 73 y.o. female presenting to [facility] ED with increased SOB, workup for COPD exacerbation. PMH significant for chronic HFpEF, CAD status post stenting on Brilinta, iron deficiency anemia, anxiety/depression, GERD, HTN, & HLD, COPD.    PT Comments  Pt continues to participate well during skilled PT sessions despite limited tolerance due to fatigue and SOB. O2 at 2L throughout session with SpO2 remaining in upper 90's. Pt appears functionally close to baseline and states she has intermittent help if needed. She has a handicapped accessible apartment. Continue to  recommend HHPT at d/c. Continue PT acutely.   If plan is discharge home, recommend the following: A little help with walking and/or transfers;A little help with bathing/dressing/bathroom;Assist for transportation;Help with stairs or ramp for entrance;Assistance with cooking/housework   Can travel by private vehicle     No  Equipment Recommendations  None recommended by PT    Recommendations for Other Services       Precautions / Restrictions Precautions Precautions: Fall Precaution/Restrictions Comments: watch O2 Restrictions Weight Bearing Restrictions Per Provider Order: No     Mobility  Bed Mobility Overal bed mobility: Needs Assistance Bed Mobility: Supine to Sit     Supine to sit: Supervision, HOB elevated, Used rails     General bed mobility comments: able to complete supine > sit with supervision, use of rails and HOB slightly elevated.    Transfers Overall transfer level: Needs assistance Equipment used: None Transfers: Sit to/from Stand, Bed to chair/wheelchair/BSC Sit to Stand: Supervision   Step pivot transfers: Contact guard assist       General transfer comment: Patient able to stand without RW, supv. CGA for stand step transfer from bed > BSC.  Patient MOD I with toilet hygiene.    Ambulation/Gait Ambulation/Gait assistance: Supervision, Contact guard assist Gait Distance (Feet): 20 Feet Assistive device: Rolling walker (2 wheels) Gait Pattern/deviations: Step-through pattern, Decreased step length - right, Decreased step length - left Gait velocity: Decreased     General Gait Details: steady with RW; ambulation distance limited due to fatigue after using bathroom. ABle to ambulate from [facility] to recliner with supv/CGA.   Stairs             Wheelchair Mobility     Tilt Bed    Modified Rankin (Stroke Patients Only)       Balance Overall balance assessment: Needs assistance Sitting-balance support: Feet supported, No upper extremity supported Sitting balance-Leahy Scale: Good     Standing balance support: Bilateral upper extremity supported, During functional activity, Reliant on assistive device for balance Standing balance-Leahy Scale: Fair Standing balance comment: improved steadiness with use of RW                            Communication Communication Communication: No apparent difficulties  Cognition Arousal: Alert Behavior During Therapy: WFL for tasks assessed/performed   PT - Cognitive impairments: No family/caregiver present to determine baseline                       PT - Cognition Comments: Pt able to follow multi-task commands consistently Following commands: Intact      Cueing Cueing Techniques: Verbal cues  Exercises      General Comments General comments (skin integrity, edema, etc.): Pt with chronic back pain limiting tolerance for  further mobility, nursing in to administer pain meds      Pertinent Vitals/Pain Pain Assessment Pain Assessment: 0-10 Pain Score: 7  Pain Location: Chronic back pain per patient Pain Descriptors / Indicators: Sore, Aching Pain Intervention(s): Patient requesting pain meds-RN notified    Home Living                           Prior Function            PT Goals (current goals can now be found in the care plan section) Acute Rehab PT Goals Patient Stated Goal: to breathe better Progress towards PT goals: Progressing toward goals    Frequency    Min 1X/week      PT Plan      Co-evaluation              AM-PAC PT "6 Clicks" Mobility   Outcome Measure  Help needed turning from your back to your side while in a flat bed without using bedrails?: A Little Help needed moving from lying on your back to sitting on the side of a flat bed without using bedrails?: A Little Help needed moving to and from a bed to a chair (including a wheelchair)?: A Little Help needed standing up from a chair using your arms (e.g., wheelchair or bedside chair)?: A Little Help needed to walk in hospital room?: A Little Help needed climbing 3-5 steps with a railing? : A Lot 6 Click Score: 17    End of Session Equipment Utilized During Treatment: Oxygen Activity Tolerance: Patient tolerated treatment well Patient left: with call bell/phone within reach;in chair;with chair alarm set Nurse Communication: Mobility status PT Visit Diagnosis: Muscle weakness (generalized) (M62.81);Unsteadiness on feet (R26.81);Difficulty in walking, not elsewhere classified (R26.2)     Time: 1610-9604 PT Time Calculation (min) (ACUTE ONLY): 12 min  Charges:    $Therapeutic Activity: 8-22 mins PT General Charges $$ ACUTE PT VISIT: 1 Visit                    Zadie Cleverly, PTA  Jannet Askew 07/20/2023, 3:45 PM

## 2023-07-21 DIAGNOSIS — J9612 Chronic respiratory failure with hypercapnia: Secondary | ICD-10-CM | POA: Diagnosis not present

## 2023-07-21 DIAGNOSIS — J9601 Acute respiratory failure with hypoxia: Secondary | ICD-10-CM | POA: Diagnosis not present

## 2023-07-21 NOTE — Progress Notes (Signed)
Occupational Therapy Treatment Patient Details Name: Terri Burns MRN: [id number] DOB: [birth year] Today's Date: 07/21/2023   History of present illness Pt is a 73 y.o. female presenting to [facility] ED with increased SOB, workup for COPD exacerbation. PMH significant for chronic HFpEF, CAD status post stenting on Brilinta, iron deficiency anemia, anxiety/depression, GERD, HTN, & HLD, COPD.   OT comments  Terri Burns was seen for OT treatment on this date. Upon arrival to room pt reclined in bed reporting 10/10 pain (premedicated) and defers mobility but agreeable to bed level activity. Pt requires SUPERVISION rolling bed level to adjust heating pad. IND bed level grooming tasks. Educated on ECS including PLB, diaphragm breathing exercises, pain mgmt strategies, IS frequency/use, and bed level HEP. Pt making progress toward goals, will continue to follow POC. Discharge recommendation remains appropriate.        If plan is discharge home, recommend the following:  A little help with walking and/or transfers;Assistance with cooking/housework;Assist for transportation;Help with stairs or ramp for entrance;A lot of help with bathing/dressing/bathroom   Equipment Recommendations  None recommended by OT    Recommendations for Other Services      Precautions / Restrictions Precautions Precautions: Fall Restrictions Weight Bearing Restrictions Per Provider Order: No       Mobility Bed Mobility Overal bed mobility: Needs Assistance Bed Mobility: Rolling Rolling: Supervision              Transfers                   General transfer comment: pt defered citing 10/10 pain         ADL either performed or assessed with clinical judgement   ADL Overall ADL's : Needs assistance/impaired                                       General ADL Comments: SUPERVISION rolling bed level to adjust heating pad. IND bed level grooming tasks     Communication  Communication Communication: No apparent difficulties   Cognition Arousal: Alert Behavior During Therapy: WFL for tasks assessed/performed Cognition: No apparent impairments                               Following commands: Intact        Cueing   Cueing Techniques: Verbal cues             Pertinent Vitals/ Pain       Pain Assessment Pain Assessment: 0-10 Pain Score: 10-Worst pain ever Pain Location: Chronic back pain per patient Pain Descriptors / Indicators: Sore, Aching Pain Intervention(s): Premedicated before session   Frequency  Min 1X/week        Progress Toward Goals  OT Goals(current goals can now be found in the care plan section)  Progress towards OT goals: Progressing toward goals  Acute Rehab OT Goals OT Goal Formulation: With patient Time For Goal Achievement: 07/31/23 Potential to Achieve Goals: Good ADL Goals Additional ADL Goal #1: Pt will complete morning ADL routine including toileting, grooming, and dressing with modified independence utilizing learned ECS to minimize SOB, 2/2 opportunities. Additional ADL Goal #2: Pt will verbalize plan to implement at least 1 learned falls prevention strategy to maximize safety/indep with ADL/mobility.  Plan      Co-evaluation  AM-PAC OT "6 Clicks" Daily Activity     Outcome Measure   Help from another person eating meals?: None Help from another person taking care of personal grooming?: A Little Help from another person toileting, which includes using toliet, bedpan, or urinal?: A Little Help from another person bathing (including washing, rinsing, drying)?: A Little Help from another person to put on and taking off regular upper body clothing?: A Little Help from another person to put on and taking off regular lower body clothing?: A Little 6 Click Score: 19    End of Session    OT Visit Diagnosis: Other abnormalities of gait and mobility (R26.89);Muscle  weakness (generalized) (M62.81)   Activity Tolerance Patient limited by pain   Patient Left in bed;with call bell/phone within reach;with bed alarm set   Nurse Communication          Time: 1610-9604 OT Time Calculation (min): 8 min  Charges: OT General Charges $OT Visit: 1 Visit OT Treatments $Self Care/Home Management : 8-22 mins  Kathie Dike, M.S. OTR/L  07/21/23, 4:19 PM  ascom [phone]

## 2023-07-21 NOTE — Progress Notes (Signed)
Progress Note    Terri Burns  ZOX:[id number] DOB: [birth year]  DOA: 07/15/2023 PCP: Louis Matte, MD      Brief Narrative:    Medical records reviewed and are as summarized below:  Terri Burns is a 73 y.o. female with history of severe COPD, hyperlipidemia, depression, anxiety, hypertension, GERD, presented to the hospital with shortness of breath.    Assessment/Plan:   Principal Problem:   Acute hypoxic on chronic hypercapnic respiratory failure (HCC) Active Problems:   COPD exacerbation (HCC)   Acute on chronic diastolic CHF (congestive heart failure) (HCC)   Anxiety and depression   GERD (gastroesophageal reflux disease)   Benign essential HTN   Senile purpura (HCC)   Depression   HLD (hyperlipidemia)   Nicotine dependence, cigarettes, uncomplicated   Centrilobular emphysema (HCC)   Coronary artery disease involving native coronary artery of native heart without angina pectoris   Elevated troponin    Body mass index is 25.48 kg/m.   Acute exacerbation of severe COPD: Continue bronchodilators.  Completed 5 days of steroids, IV azithromycin and IV cefepime on 07/19/2023.   Outpatient follow-up with Dr. Belia Heman, pulmonologist, on 08/09/2023   Acute on chronic hypoxemic respiratory failure:  ABG on 07/19/2023 showed pH of 7.4, pCO2 43, pO2 111, bicarb of 26.6, O2 sat 98.4% on 2 L of oxygen. Unfortunately ABG results will not qualify her for home BiPAP. Continue 2 L/min oxygen via nasal cannula.   Elevated troponin: This is likely from demand ischemia. CAD: Continue aspirin, Brilinta and atorvastatin   Tobacco use disorder: Continue nicotine patch.  Counseled to quit smoking cigarettes   Comorbidities include anxiety, depression, hypertension   Diet Order             Diet heart healthy/carb modified Fluid consistency: Thin  Diet effective now                             Consultants: Pulmonologist  Procedures: None    Medications:    aspirin EC  81 mg Oral Daily   atorvastatin  80 mg Oral QHS   citalopram  30 mg Oral Daily   cyanocobalamin  1,000 mcg Oral Daily   ferrous sulfate  325 mg Oral QAC breakfast   fluticasone furoate-vilanterol  1 puff Inhalation Daily   And   umeclidinium bromide  1 puff Inhalation Daily   furosemide  20 mg Intravenous Daily   heparin  5,000 Units Subcutaneous Q8H   ipratropium-albuterol  3 mL Nebulization TID   losartan  25 mg Oral QPM   metoprolol succinate  12.5 mg Oral QPM   pantoprazole  40 mg Oral BID   ticagrelor  90 mg Oral BID   Continuous Infusions:     Anti-infectives (From admission, onward)    Start     Dose/Rate Route Frequency Ordered Stop   07/16/23 1000  azithromycin (ZITHROMAX) 500 mg in sodium chloride 0.9 % 250 mL IVPB        500 mg 250 mL/hr over 60 Minutes Intravenous Every 24 hours 07/15/23 1521 07/19/23 1150   07/16/23 1000  cefTRIAXone (ROCEPHIN) 2 g in sodium chloride 0.9 % 100 mL IVPB  Status:  Discontinued        2 g 200 mL/hr over 30 Minutes Intravenous Every 24 hours 07/15/23 1711 07/15/23 1726   07/15/23 1800  ceFEPIme (MAXIPIME) 2 g in sodium chloride 0.9 % 100 mL IVPB  Status:  Discontinued        2 g 200 mL/hr over 30 Minutes Intravenous Every 12 hours 07/15/23 1738 07/20/23 1554   07/15/23 1015  cefTRIAXone (ROCEPHIN) 2 g in sodium chloride 0.9 % 100 mL IVPB        2 g 200 mL/hr over 30 Minutes Intravenous Once 07/15/23 1002 07/15/23 1206   07/15/23 1015  azithromycin (ZITHROMAX) 500 mg in sodium chloride 0.9 % 250 mL IVPB        500 mg 250 mL/hr over 60 Minutes Intravenous  Once 07/15/23 1002 07/15/23 1206              Family Communication/Anticipated D/C date and plan/Code Status   DVT prophylaxis: heparin injection 5,000 Units Start: 07/15/23 2200 Place TED hose Start: 07/15/23 1520     Code Status: Limited: Do not attempt  resuscitation (DNR) -DNR-LIMITED -Do Not Intubate/DNI   Family Communication: None Disposition Plan: Plan to discharge to SNF   Status is: Inpatient Remains inpatient appropriate because: Awaiting placement to SNF       Subjective:   Interval events noted.  She complains of general weakness.  No shortness of breath or chest pain.  Objective:    Vitals:   07/20/23 1953 07/20/23 2235 07/21/23 0745 07/21/23 1542  BP:  (!) 152/98 (!) 157/76 115/79  Pulse:  63 67 65  Resp:  18 19 16   Temp:  (!) 97.5 F (36.4 C) 98.2 F (36.8 C) 97.6 F (36.4 C)  TempSrc:  Oral Oral Oral  SpO2: 100% 100% 100% 100%  Weight:      Height:       No data found.   Intake/Output Summary (Last 24 hours) at 07/21/2023 1607 Last data filed at 07/21/2023 1500 Gross per 24 hour  Intake 240 ml  Output --  Net 240 ml   Filed Weights   07/15/23 0912  Weight: 55.3 kg    Exam:    GEN: NAD SKIN: Warm and dry EYES: No pallor or icterus ENT: MMM CV: RRR PULM: Bilateral rhonchi ABD: soft, ND, NT, +BS CNS: AAO x 3, non focal EXT: No edema or tenderness    Data Reviewed:   I have personally reviewed following labs and imaging studies:  Labs: Labs show the following:   Basic Metabolic Panel: Recent Labs  Lab 07/15/23 0920 07/16/23 0445 07/17/23 0457 07/19/23 0535  NA 141 137 139 138  K 4.2 3.8 3.8 3.8  CL 110 106 105 105  CO2 22 23 28 26   GLUCOSE 121* 113* 86 76  BUN 13 11 17  24*  CREATININE 0.60 0.48 0.55 0.63  CALCIUM 8.2* 8.1* 8.1* 8.1*   GFR Estimated Creatinine Clearance: 47.6 mL/min (by C-G formula based on SCr of 0.63 mg/dL). Liver Function Tests: Recent Labs  Lab 07/15/23 0920  AST 34  ALT 30  ALKPHOS 55  BILITOT 0.5  PROT 6.1*  ALBUMIN 3.1*   No results for input(s): "LIPASE", "AMYLASE" in the last 168 hours. No results for input(s): "AMMONIA" in the last 168 hours. Coagulation profile No results for input(s): "INR", "PROTIME" in the last 168  hours.  CBC: Recent Labs  Lab 07/15/23 0920 07/16/23 0445 07/17/23 0457 07/19/23 0535  WBC 13.7* 9.4 10.0 8.6  NEUTROABS 10.4*  --   --   --   HGB 10.4* 8.6* 8.8* 8.7*  HCT 34.4* 27.0* 28.0* 27.1*  MCV 94.8 89.4 90.3 88.9  PLT 460* 343 384 367   Cardiac Enzymes: No results for input(s): "  CKTOTAL", "CKMB", "CKMBINDEX", "TROPONINI" in the last 168 hours. BNP (last 3 results) No results for input(s): "PROBNP" in the last 8760 hours. CBG: No results for input(s): "GLUCAP" in the last 168 hours. D-Dimer: No results for input(s): "DDIMER" in the last 72 hours. Hgb A1c: No results for input(s): "HGBA1C" in the last 72 hours. Lipid Profile: No results for input(s): "CHOL", "HDL", "LDLCALC", "TRIG", "CHOLHDL", "LDLDIRECT" in the last 72 hours. Thyroid function studies: No results for input(s): "TSH", "T4TOTAL", "T3FREE", "THYROIDAB" in the last 72 hours.  Invalid input(s): "FREET3" Anemia work up: No results for input(s): "VITAMINB12", "FOLATE", "FERRITIN", "TIBC", "IRON", "RETICCTPCT" in the last 72 hours. Sepsis Labs: Recent Labs  Lab 07/15/23 0920 07/15/23 1037 07/15/23 1207 07/16/23 0445 07/17/23 0457 07/19/23 0535  PROCALCITON  --   --  <0.10  --   --   --   WBC 13.7*  --   --  9.4 10.0 8.6  LATICACIDVEN  --  1.4  --   --   --   --     Microbiology Recent Results (from the past 240 hours)  Resp panel by RT-PCR (RSV, Flu A&B, Covid) Anterior Nasal Swab     Status: None   Collection Time: 07/15/23  9:20 AM   Specimen: Anterior Nasal Swab  Result Value Ref Range Status   SARS Coronavirus 2 by RT PCR NEGATIVE NEGATIVE Final    Comment: (NOTE) SARS-CoV-2 target nucleic acids are NOT DETECTED.  The SARS-CoV-2 RNA is generally detectable in upper respiratory specimens during the acute phase of infection. The lowest concentration of SARS-CoV-2 viral copies this assay can detect is 138 copies/mL. A negative result does not preclude SARS-Cov-2 infection and should not  be used as the sole basis for treatment or other patient management decisions. A negative result may occur with  improper specimen collection/handling, submission of specimen other than nasopharyngeal swab, presence of viral mutation(s) within the areas targeted by this assay, and inadequate number of viral copies(<138 copies/mL). A negative result must be combined with clinical observations, patient history, and epidemiological information. The expected result is Negative.  Fact Sheet for Patients:  BloggerCourse.com  Fact Sheet for Healthcare Providers:  SeriousBroker.it  This test is no t yet approved or cleared by the [place] FDA and  has been authorized for detection and/or diagnosis of SARS-CoV-2 by FDA under an Emergency Use Authorization (EUA). This EUA will remain  in effect (meaning this test can be used) for the duration of the COVID-19 declaration under Section 564(b)(1) of the Act, 21 U.S.C.section 360bbb-3(b)(1), unless the authorization is terminated  or revoked sooner.       Influenza A by PCR NEGATIVE NEGATIVE Final   Influenza B by PCR NEGATIVE NEGATIVE Final    Comment: (NOTE) The Xpert Xpress SARS-CoV-2/FLU/RSV plus assay is intended as an aid in the diagnosis of influenza from Nasopharyngeal swab specimens and should not be used as a sole basis for treatment. Nasal washings and aspirates are unacceptable for Xpert Xpress SARS-CoV-2/FLU/RSV testing.  Fact Sheet for Patients: BloggerCourse.com  Fact Sheet for Healthcare Providers: SeriousBroker.it  This test is not yet approved or cleared by the [place] FDA and has been authorized for detection and/or diagnosis of SARS-CoV-2 by FDA under an Emergency Use Authorization (EUA). This EUA will remain in effect (meaning this test can be used) for the duration of the COVID-19 declaration under Section  564(b)(1) of the Act, 21 U.S.C. section 360bbb-3(b)(1), unless the authorization is terminated or revoked.  Resp Syncytial Virus by PCR NEGATIVE NEGATIVE Final    Comment: (NOTE) Fact Sheet for Patients: BloggerCourse.com  Fact Sheet for Healthcare Providers: SeriousBroker.it  This test is not yet approved or cleared by the [place] FDA and has been authorized for detection and/or diagnosis of SARS-CoV-2 by FDA under an Emergency Use Authorization (EUA). This EUA will remain in effect (meaning this test can be used) for the duration of the COVID-19 declaration under Section 564(b)(1) of the Act, 21 U.S.C. section 360bbb-3(b)(1), unless the authorization is terminated or revoked.  Performed at [facility], [address] [place], [place] [address]   Blood culture (routine x 2)     Status: None   Collection Time: 07/15/23 10:37 AM   Specimen: BLOOD  Result Value Ref Range Status   Specimen Description BLOOD LEFT ANTECUBITAL  Final   Special Requests   Final    BOTTLES DRAWN AEROBIC AND ANAEROBIC Blood Culture results may not be optimal due to an inadequate volume of blood received in culture bottles   Culture   Final    NO GROWTH 5 DAYS Performed at [facility], [address] [place], [place] [address]    Report Status 07/20/2023 FINAL  Final  Blood culture (routine x 2)     Status: None   Collection Time: 07/15/23 10:38 AM   Specimen: BLOOD  Result Value Ref Range Status   Specimen Description BLOOD BLOOD RIGHT WRIST  Final   Special Requests   Final    BOTTLES DRAWN AEROBIC AND ANAEROBIC Blood Culture results may not be optimal due to an inadequate volume of blood received in culture bottles   Culture   Final    NO GROWTH 5 DAYS Performed at [facility], [address] [place], [place] [address]    Report Status 07/20/2023 FINAL  Final  Respiratory (~20 pathogens) panel by PCR      Status: None   Collection Time: 07/15/23  5:39 PM   Specimen: Nasopharyngeal Swab; Respiratory  Result Value Ref Range Status   Adenovirus NOT DETECTED NOT DETECTED Final   Coronavirus 229E NOT DETECTED NOT DETECTED Final    Comment: (NOTE) The Coronavirus on the Respiratory Panel, DOES NOT test for the novel  Coronavirus (2019 nCoV)    Coronavirus HKU1 NOT DETECTED NOT DETECTED Final   Coronavirus NL63 NOT DETECTED NOT DETECTED Final   Coronavirus OC43 NOT DETECTED NOT DETECTED Final   Metapneumovirus NOT DETECTED NOT DETECTED Final   Rhinovirus / Enterovirus NOT DETECTED NOT DETECTED Final   Influenza A NOT DETECTED NOT DETECTED Final   Influenza B NOT DETECTED NOT DETECTED Final   Parainfluenza Virus 1 NOT DETECTED NOT DETECTED Final   Parainfluenza Virus 2 NOT DETECTED NOT DETECTED Final   Parainfluenza Virus 3 NOT DETECTED NOT DETECTED Final   Parainfluenza Virus 4 NOT DETECTED NOT DETECTED Final   Respiratory Syncytial Virus NOT DETECTED NOT DETECTED Final   Bordetella pertussis NOT DETECTED NOT DETECTED Final   Bordetella Parapertussis NOT DETECTED NOT DETECTED Final   Chlamydophila pneumoniae NOT DETECTED NOT DETECTED Final   Mycoplasma pneumoniae NOT DETECTED NOT DETECTED Final    Comment: Performed at [facility], [address] [place], [place] [address]  MRSA Next Gen by PCR, Nasal     Status: None   Collection Time: 07/15/23  5:39 PM   Specimen: Nasopharyngeal Swab; Nasal Swab  Result Value Ref Range Status   MRSA by PCR Next Gen NOT DETECTED  NOT DETECTED Final    Comment: (NOTE) The GeneXpert MRSA Assay (FDA approved for NASAL specimens only), is one component of a comprehensive MRSA colonization surveillance program. It is not intended to diagnose MRSA infection nor to guide or monitor treatment for MRSA infections. Test performance is not FDA approved in patients less than 56 years old. Performed at [facility], [address]  [place], [place] [address]     Procedures and diagnostic studies:  No results found.             LOS: 6 days   Alleene Stoy  [facility]   Pager on [email or web address]. If 7PM-7AM, please contact night-coverage at [email or web address]     07/21/2023, 4:07 PM

## 2023-07-21 NOTE — Plan of Care (Signed)
  Problem: Health Behavior/Discharge Planning: Goal: Ability to manage health-related needs will improve Outcome: Progressing   Problem: Clinical Measurements: Goal: Diagnostic test results will improve Outcome: Progressing   Problem: Activity: Goal: Risk for activity intolerance will decrease Outcome: Progressing   Problem: Coping: Goal: Level of anxiety will decrease Outcome: Progressing   Problem: Safety: Goal: Ability to remain free from injury will improve Outcome: Progressing   Problem: Respiratory: Goal: Levels of oxygenation will improve Outcome: Progressing

## 2023-07-22 DIAGNOSIS — J9612 Chronic respiratory failure with hypercapnia: Secondary | ICD-10-CM | POA: Diagnosis not present

## 2023-07-22 DIAGNOSIS — J9601 Acute respiratory failure with hypoxia: Secondary | ICD-10-CM | POA: Diagnosis not present

## 2023-07-22 NOTE — Progress Notes (Signed)
Progress Note    Terri Burns  ZOX:[id number] DOB: 1951-04-29  DOA: 07/15/2023 PCP: Louis Matte, MD      Brief Narrative:    Medical records reviewed and are as summarized below:  Terri Burns is a 73 y.o. female with history of severe COPD, hyperlipidemia, depression, anxiety, hypertension, GERD, presented to the hospital with shortness of breath.    Assessment/Plan:   Principal Problem:   Acute hypoxic on chronic hypercapnic respiratory failure (HCC) Active Problems:   COPD exacerbation (HCC)   Acute on chronic diastolic CHF (congestive heart failure) (HCC)   Anxiety and depression   GERD (gastroesophageal reflux disease)   Benign essential HTN   Senile purpura (HCC)   Depression   HLD (hyperlipidemia)   Nicotine dependence, cigarettes, uncomplicated   Centrilobular emphysema (HCC)   Coronary artery disease involving native coronary artery of native heart without angina pectoris   Elevated troponin    Body mass index is 25.48 kg/m.   Acute exacerbation of severe COPD: Improved.  Continue bronchodilators.  Completed 5 days of steroids, IV azithromycin and IV cefepime on 07/19/2023.   Outpatient follow-up with Dr. Belia Heman, pulmonologist, on 08/09/2023   Acute on chronic hypoxemic respiratory failure:  ABG on 07/19/2023 showed pH of 7.4, pCO2 43, pO2 111, bicarb of 26.6, O2 sat 98.4% on 2 L of oxygen. Unfortunately ABG results will not qualify her for home BiPAP. Continue 2 L/min oxygen via nasal cannula.   Elevated troponin: This is likely from demand ischemia. CAD: Continue aspirin, Brilinta and atorvastatin   Tobacco use disorder: Continue nicotine patch.  Counseled to quit smoking cigarettes   Comorbidities include anxiety, depression, hypertension  Medically stable for discharge but awaiting placement to SNF  Diet Order             Diet heart healthy/carb modified Fluid consistency: Thin  Diet effective now                             Consultants: Pulmonologist  Procedures: None    Medications:    aspirin EC  81 mg Oral Daily   atorvastatin  80 mg Oral QHS   citalopram  30 mg Oral Daily   cyanocobalamin  1,000 mcg Oral Daily   ferrous sulfate  325 mg Oral QAC breakfast   fluticasone furoate-vilanterol  1 puff Inhalation Daily   And   umeclidinium bromide  1 puff Inhalation Daily   furosemide  20 mg Intravenous Daily   heparin  5,000 Units Subcutaneous Q8H   ipratropium-albuterol  3 mL Nebulization TID   losartan  25 mg Oral QPM   metoprolol succinate  12.5 mg Oral QPM   pantoprazole  40 mg Oral BID   ticagrelor  90 mg Oral BID   Continuous Infusions:     Anti-infectives (From admission, onward)    Start     Dose/Rate Route Frequency Ordered Stop   07/16/23 1000  azithromycin (ZITHROMAX) 500 mg in sodium chloride 0.9 % 250 mL IVPB        500 mg 250 mL/hr over 60 Minutes Intravenous Every 24 hours 07/15/23 1521 07/19/23 1150   07/16/23 1000  cefTRIAXone (ROCEPHIN) 2 g in sodium chloride 0.9 % 100 mL IVPB  Status:  Discontinued        2 g 200 mL/hr over 30 Minutes Intravenous Every 24 hours 07/15/23 1711 07/15/23 1726   07/15/23 1800  ceFEPIme (MAXIPIME) 2  g in sodium chloride 0.9 % 100 mL IVPB  Status:  Discontinued        2 g 200 mL/hr over 30 Minutes Intravenous Every 12 hours 07/15/23 1738 07/20/23 1554   07/15/23 1015  cefTRIAXone (ROCEPHIN) 2 g in sodium chloride 0.9 % 100 mL IVPB        2 g 200 mL/hr over 30 Minutes Intravenous Once 07/15/23 1002 07/15/23 1206   07/15/23 1015  azithromycin (ZITHROMAX) 500 mg in sodium chloride 0.9 % 250 mL IVPB        500 mg 250 mL/hr over 60 Minutes Intravenous  Once 07/15/23 1002 07/15/23 1206              Family Communication/Anticipated D/C date and plan/Code Status   DVT prophylaxis: heparin injection 5,000 Units Start: 07/15/23 2200 Place TED hose Start: 07/15/23 1520     Code Status: Limited: Do not attempt  resuscitation (DNR) -DNR-LIMITED -Do Not Intubate/DNI   Family Communication: None Disposition Plan: Plan to discharge to SNF   Status is: Inpatient Remains inpatient appropriate because: Awaiting placement to SNF       Subjective:   No acute events overnight.  She complains of general weakness.  No other complaints.  No shortness of breath or chest pain.  Objective:    Vitals:   07/21/23 0745 07/21/23 1542 07/21/23 2327 07/22/23 0811  BP: (!) 157/76 115/79 133/76 135/80  Pulse: 67 65 63 60  Resp: 19 16 18 18   Temp: 98.2 F (36.8 C) 97.6 F (36.4 C) 98 F (36.7 C) 98.1 F (36.7 C)  TempSrc: Oral Oral  Oral  SpO2: 100% 100% 100% 100%  Weight:      Height:       No data found.   Intake/Output Summary (Last 24 hours) at 07/22/2023 1517 Last data filed at 07/21/2023 1929 Gross per 24 hour  Intake 240 ml  Output --  Net 240 ml   Filed Weights   07/15/23 0912  Weight: 55.3 kg    Exam:    GEN: NAD SKIN: Warm and dry EYES: No pallor or icterus ENT: MMM CV: RRR PULM: Bilateral wheezing ABD: soft, ND, NT, +BS CNS: AAO x 3, non focal EXT: No edema or tenderness     Data Reviewed:   I have personally reviewed following labs and imaging studies:  Labs: Labs show the following:   Basic Metabolic Panel: Recent Labs  Lab 07/16/23 0445 07/17/23 0457 07/19/23 0535  NA 137 139 138  K 3.8 3.8 3.8  CL 106 105 105  CO2 23 28 26   GLUCOSE 113* 86 76  BUN 11 17 24*  CREATININE 0.48 0.55 0.63  CALCIUM 8.1* 8.1* 8.1*   GFR Estimated Creatinine Clearance: 47.6 mL/min (by C-G formula based on SCr of 0.63 mg/dL). Liver Function Tests: No results for input(s): "AST", "ALT", "ALKPHOS", "BILITOT", "PROT", "ALBUMIN" in the last 168 hours.  No results for input(s): "LIPASE", "AMYLASE" in the last 168 hours. No results for input(s): "AMMONIA" in the last 168 hours. Coagulation profile No results for input(s): "INR", "PROTIME" in the last 168  hours.  CBC: Recent Labs  Lab 07/16/23 0445 07/17/23 0457 07/19/23 0535  WBC 9.4 10.0 8.6  HGB 8.6* 8.8* 8.7*  HCT 27.0* 28.0* 27.1*  MCV 89.4 90.3 88.9  PLT 343 384 367   Cardiac Enzymes: No results for input(s): "CKTOTAL", "CKMB", "CKMBINDEX", "TROPONINI" in the last 168 hours. BNP (last 3 results) No results for input(s): "PROBNP" in the  last 8760 hours. CBG: No results for input(s): "GLUCAP" in the last 168 hours. D-Dimer: No results for input(s): "DDIMER" in the last 72 hours. Hgb A1c: No results for input(s): "HGBA1C" in the last 72 hours. Lipid Profile: No results for input(s): "CHOL", "HDL", "LDLCALC", "TRIG", "CHOLHDL", "LDLDIRECT" in the last 72 hours. Thyroid function studies: No results for input(s): "TSH", "T4TOTAL", "T3FREE", "THYROIDAB" in the last 72 hours.  Invalid input(s): "FREET3" Anemia work up: No results for input(s): "VITAMINB12", "FOLATE", "FERRITIN", "TIBC", "IRON", "RETICCTPCT" in the last 72 hours. Sepsis Labs: Recent Labs  Lab 07/16/23 0445 07/17/23 0457 07/19/23 0535  WBC 9.4 10.0 8.6    Microbiology Recent Results (from the past 240 hours)  Resp panel by RT-PCR (RSV, Flu A&B, Covid) Anterior Nasal Swab     Status: None   Collection Time: 07/15/23  9:20 AM   Specimen: Anterior Nasal Swab  Result Value Ref Range Status   SARS Coronavirus 2 by RT PCR NEGATIVE NEGATIVE Final    Comment: (NOTE) SARS-CoV-2 target nucleic acids are NOT DETECTED.  The SARS-CoV-2 RNA is generally detectable in upper respiratory specimens during the acute phase of infection. The lowest concentration of SARS-CoV-2 viral copies this assay can detect is 138 copies/mL. A negative result does not preclude SARS-Cov-2 infection and should not be used as the sole basis for treatment or other patient management decisions. A negative result may occur with  improper specimen collection/handling, submission of specimen other than nasopharyngeal swab, presence of  viral mutation(s) within the areas targeted by this assay, and inadequate number of viral copies(<138 copies/mL). A negative result must be combined with clinical observations, patient history, and epidemiological information. The expected result is Negative.  Fact Sheet for Patients:  BloggerCourse.com  Fact Sheet for Healthcare Providers:  SeriousBroker.it  This test is no t yet approved or cleared by the [place] FDA and  has been authorized for detection and/or diagnosis of SARS-CoV-2 by FDA under an Emergency Use Authorization (EUA). This EUA will remain  in effect (meaning this test can be used) for the duration of the COVID-19 declaration under Section 564(b)(1) of the Act, 21 U.S.C.section 360bbb-3(b)(1), unless the authorization is terminated  or revoked sooner.       Influenza A by PCR NEGATIVE NEGATIVE Final   Influenza B by PCR NEGATIVE NEGATIVE Final    Comment: (NOTE) The Xpert Xpress SARS-CoV-2/FLU/RSV plus assay is intended as an aid in the diagnosis of influenza from Nasopharyngeal swab specimens and should not be used as a sole basis for treatment. Nasal washings and aspirates are unacceptable for Xpert Xpress SARS-CoV-2/FLU/RSV testing.  Fact Sheet for Patients: BloggerCourse.com  Fact Sheet for Healthcare Providers: SeriousBroker.it  This test is not yet approved or cleared by the [place] FDA and has been authorized for detection and/or diagnosis of SARS-CoV-2 by FDA under an Emergency Use Authorization (EUA). This EUA will remain in effect (meaning this test can be used) for the duration of the COVID-19 declaration under Section 564(b)(1) of the Act, 21 U.S.C. section 360bbb-3(b)(1), unless the authorization is terminated or revoked.     Resp Syncytial Virus by PCR NEGATIVE NEGATIVE Final    Comment: (NOTE) Fact Sheet for  Patients: BloggerCourse.com  Fact Sheet for Healthcare Providers: SeriousBroker.it  This test is not yet approved or cleared by the [place] FDA and has been authorized for detection and/or diagnosis of SARS-CoV-2 by FDA under an Emergency Use Authorization (EUA). This EUA will remain in effect (meaning this test can  be used) for the duration of the COVID-19 declaration under Section 564(b)(1) of the Act, 21 U.S.C. section 360bbb-3(b)(1), unless the authorization is terminated or revoked.  Performed at [facility], [address] [place], [place] [address]   Blood culture (routine x 2)     Status: None   Collection Time: 07/15/23 10:37 AM   Specimen: BLOOD  Result Value Ref Range Status   Specimen Description BLOOD LEFT ANTECUBITAL  Final   Special Requests   Final    BOTTLES DRAWN AEROBIC AND ANAEROBIC Blood Culture results may not be optimal due to an inadequate volume of blood received in culture bottles   Culture   Final    NO GROWTH 5 DAYS Performed at [facility], [address] [place], [place] [address]    Report Status 07/20/2023 FINAL  Final  Blood culture (routine x 2)     Status: None   Collection Time: 07/15/23 10:38 AM   Specimen: BLOOD  Result Value Ref Range Status   Specimen Description BLOOD BLOOD RIGHT WRIST  Final   Special Requests   Final    BOTTLES DRAWN AEROBIC AND ANAEROBIC Blood Culture results may not be optimal due to an inadequate volume of blood received in culture bottles   Culture   Final    NO GROWTH 5 DAYS Performed at [facility], [address] [place], [place] [address]    Report Status 07/20/2023 FINAL  Final  Respiratory (~20 pathogens) panel by PCR     Status: None   Collection Time: 07/15/23  5:39 PM   Specimen: Nasopharyngeal Swab; Respiratory  Result Value Ref Range Status   Adenovirus NOT DETECTED NOT DETECTED Final   Coronavirus 229E NOT  DETECTED NOT DETECTED Final    Comment: (NOTE) The Coronavirus on the Respiratory Panel, DOES NOT test for the novel  Coronavirus (2019 nCoV)    Coronavirus HKU1 NOT DETECTED NOT DETECTED Final   Coronavirus NL63 NOT DETECTED NOT DETECTED Final   Coronavirus OC43 NOT DETECTED NOT DETECTED Final   Metapneumovirus NOT DETECTED NOT DETECTED Final   Rhinovirus / Enterovirus NOT DETECTED NOT DETECTED Final   Influenza A NOT DETECTED NOT DETECTED Final   Influenza B NOT DETECTED NOT DETECTED Final   Parainfluenza Virus 1 NOT DETECTED NOT DETECTED Final   Parainfluenza Virus 2 NOT DETECTED NOT DETECTED Final   Parainfluenza Virus 3 NOT DETECTED NOT DETECTED Final   Parainfluenza Virus 4 NOT DETECTED NOT DETECTED Final   Respiratory Syncytial Virus NOT DETECTED NOT DETECTED Final   Bordetella pertussis NOT DETECTED NOT DETECTED Final   Bordetella Parapertussis NOT DETECTED NOT DETECTED Final   Chlamydophila pneumoniae NOT DETECTED NOT DETECTED Final   Mycoplasma pneumoniae NOT DETECTED NOT DETECTED Final    Comment: Performed at [facility], [address], [place], [place] [address]  MRSA Next Gen by PCR, Nasal     Status: None   Collection Time: 07/15/23  5:39 PM   Specimen: Nasopharyngeal Swab; Nasal Swab  Result Value Ref Range Status   MRSA by PCR Next Gen NOT DETECTED NOT DETECTED Final    Comment: (NOTE) The GeneXpert MRSA Assay (FDA approved for NASAL specimens only), is one component of a comprehensive MRSA colonization surveillance program. It is not intended to diagnose MRSA infection nor to guide or monitor treatment for MRSA infections. Test performance is not FDA approved in patients less than 71 years old. Performed at [facility], [address]  [place], [place] [address]     Procedures and diagnostic studies:  No results found.             LOS: 7 days   Tyronica Truxillo  [facility]   Pager on [email or web address]. If 7PM-7AM, please  contact night-coverage at [email or web address]     07/22/2023, 3:18 PM

## 2023-07-22 NOTE — Progress Notes (Signed)
PT Cancellation Note  Patient Details Name: Terri Burns MRN: [id number] DOB: 1951-04-26   Cancelled Treatment:    Reason Eval/Treat Not Completed: Other (comment)  Offered session.  Stated she walked earlier and had just returned to bed and generally fatigued.  Will return at a later time/date.   Danielle Dess 07/22/2023, 2:32 PM

## 2023-07-22 NOTE — Progress Notes (Signed)
Mobility Specialist - Progress Note   07/22/23 1049  Mobility  Activity Ambulated with assistance in room;Stood at bedside;Dangled on edge of bed  Level of Assistance Standby assist, set-up cues, supervision of patient - no hands on  Assistive Device Front wheel walker  Distance Ambulated (ft) 30 ft  Activity Response Tolerated well  Mobility Referral Yes  Mobility visit 1 Mobility  Mobility Specialist Start Time (ACUTE ONLY) 1013  Mobility Specialist Stop Time (ACUTE ONLY) 1029  Mobility Specialist Time Calculation (min) (ACUTE ONLY) 16 min   Pt supine in bed on 2L upon arrival. Pt completes bed mobility indep. Pt STS and ambulates in room SBA. Pt left in recliner with needs in reach and chair alarm activated.   Terrilyn Saver  Mobility Specialist  07/22/23 10:55 AM

## 2023-07-22 NOTE — Plan of Care (Signed)
  Problem: Clinical Measurements: Goal: Diagnostic test results will improve Outcome: Progressing Goal: Respiratory complications will improve Outcome: Progressing   Problem: Activity: Goal: Risk for activity intolerance will decrease Outcome: Progressing   Problem: Nutrition: Goal: Adequate nutrition will be maintained Outcome: Progressing   Problem: Pain Managment: Goal: General experience of comfort will improve and/or be controlled Outcome: Progressing   Problem: Safety: Goal: Ability to remain free from injury will improve Outcome: Progressing   Problem: Activity: Goal: Ability to tolerate increased activity will improve Outcome: Progressing   Problem: Respiratory: Goal: Levels of oxygenation will improve Outcome: Progressing

## 2023-07-23 DIAGNOSIS — J9612 Chronic respiratory failure with hypercapnia: Secondary | ICD-10-CM | POA: Diagnosis not present

## 2023-07-23 DIAGNOSIS — J9601 Acute respiratory failure with hypoxia: Secondary | ICD-10-CM | POA: Diagnosis not present

## 2023-07-23 NOTE — Progress Notes (Signed)
Progress Note    Terri Burns  AOZ:[id number] DOB: 1951/03/16  DOA: 07/15/2023 PCP: Louis Matte, MD      Brief Narrative:    Medical records reviewed and are as summarized below:  Terri Burns is a 73 y.o. female with history of severe COPD, hyperlipidemia, depression, anxiety, hypertension, GERD, presented to the hospital with shortness of breath.    Assessment/Plan:   Principal Problem:   Acute hypoxic on chronic hypercapnic respiratory failure (HCC) Active Problems:   COPD exacerbation (HCC)   Acute on chronic diastolic CHF (congestive heart failure) (HCC)   Anxiety and depression   GERD (gastroesophageal reflux disease)   Benign essential HTN   Senile purpura (HCC)   Depression   HLD (hyperlipidemia)   Nicotine dependence, cigarettes, uncomplicated   Centrilobular emphysema (HCC)   Coronary artery disease involving native coronary artery of native heart without angina pectoris   Elevated troponin    Body mass index is 25.48 kg/m.   Acute exacerbation of severe COPD: Improved.  Use DuoNeb as needed.  Continue triple inhaler therapy.   Completed 5 days of steroids, IV azithromycin and IV cefepime on 07/19/2023.   Outpatient follow-up with Dr. Belia Heman, pulmonologist, on 08/09/2023   Acute on chronic hypoxemic respiratory failure:  ABG on 07/19/2023 showed pH of 7.4, pCO2 43, pO2 111, bicarb of 26.6, O2 sat 98.4% on 2 L of oxygen. Unfortunately ABG results will not qualify her for home BiPAP. Continue 2 L/min oxygen via nasal cannula.   Elevated troponin: This is likely from demand ischemia. CAD: Continue aspirin, Brilinta and atorvastatin   Tobacco use disorder: Continue nicotine patch.  Counseled to quit smoking cigarettes   Comorbidities include anxiety, depression, hypertension  Medically stable for discharge but awaiting placement to SNF  Diet Order             Diet heart healthy/carb modified Fluid consistency: Thin  Diet effective  now                            Consultants: Pulmonologist  Procedures: None    Medications:    aspirin EC  81 mg Oral Daily   atorvastatin  80 mg Oral QHS   citalopram  30 mg Oral Daily   cyanocobalamin  1,000 mcg Oral Daily   ferrous sulfate  325 mg Oral QAC breakfast   fluticasone furoate-vilanterol  1 puff Inhalation Daily   And   umeclidinium bromide  1 puff Inhalation Daily   furosemide  20 mg Intravenous Daily   heparin  5,000 Units Subcutaneous Q8H   ipratropium-albuterol  3 mL Nebulization TID   losartan  25 mg Oral QPM   metoprolol succinate  12.5 mg Oral QPM   pantoprazole  40 mg Oral BID   ticagrelor  90 mg Oral BID   Continuous Infusions:     Anti-infectives (From admission, onward)    Start     Dose/Rate Route Frequency Ordered Stop   07/16/23 1000  azithromycin (ZITHROMAX) 500 mg in sodium chloride 0.9 % 250 mL IVPB        500 mg 250 mL/hr over 60 Minutes Intravenous Every 24 hours 07/15/23 1521 07/19/23 1150   07/16/23 1000  cefTRIAXone (ROCEPHIN) 2 g in sodium chloride 0.9 % 100 mL IVPB  Status:  Discontinued        2 g 200 mL/hr over 30 Minutes Intravenous Every 24 hours 07/15/23 1711 07/15/23 1726  07/15/23 1800  ceFEPIme (MAXIPIME) 2 g in sodium chloride 0.9 % 100 mL IVPB  Status:  Discontinued        2 g 200 mL/hr over 30 Minutes Intravenous Every 12 hours 07/15/23 1738 07/20/23 1554   07/15/23 1015  cefTRIAXone (ROCEPHIN) 2 g in sodium chloride 0.9 % 100 mL IVPB        2 g 200 mL/hr over 30 Minutes Intravenous Once 07/15/23 1002 07/15/23 1206   07/15/23 1015  azithromycin (ZITHROMAX) 500 mg in sodium chloride 0.9 % 250 mL IVPB        500 mg 250 mL/hr over 60 Minutes Intravenous  Once 07/15/23 1002 07/15/23 1206              Family Communication/Anticipated D/C date and plan/Code Status   DVT prophylaxis: heparin injection 5,000 Units Start: 07/15/23 2200 Place TED hose Start: 07/15/23 1520     Code Status:  Limited: Do not attempt resuscitation (DNR) -DNR-LIMITED -Do Not Intubate/DNI   Family Communication: None Disposition Plan: Plan to discharge to SNF   Status is: Inpatient Remains inpatient appropriate because: Awaiting placement to SNF       Subjective:   Interval events noted.  No new complaints.  No shortness of breath or chest pain.  She still feels weak.  Objective:    Vitals:   07/22/23 2133 07/22/23 2345 07/23/23 0844 07/23/23 1439  BP:   127/76 98/71  Pulse:   67 77  Resp:   16 20  Temp:   98.1 F (36.7 C) 98 F (36.7 C)  TempSrc:   Oral Oral  SpO2: 99% 96% 100% 100%  Weight:      Height:       No data found.   Intake/Output Summary (Last 24 hours) at 07/23/2023 1510 Last data filed at 07/23/2023 0257 Gross per 24 hour  Intake --  Output 200 ml  Net -200 ml   Filed Weights   07/15/23 0912  Weight: 55.3 kg    Exam:    GEN: NAD, sitting up in the chair SKIN: Warm and dry EYES: No pallor or icterus ENT: MMM CV: RRR PULM: Mild bilateral wheezing ABD: soft, ND, NT, +BS CNS: AAO x 3, non focal EXT: No edema or tenderness     Data Reviewed:   I have personally reviewed following labs and imaging studies:  Labs: Labs show the following:   Basic Metabolic Panel: Recent Labs  Lab 07/17/23 0457 07/19/23 0535  NA 139 138  K 3.8 3.8  CL 105 105  CO2 28 26  GLUCOSE 86 76  BUN 17 24*  CREATININE 0.55 0.63  CALCIUM 8.1* 8.1*   GFR Estimated Creatinine Clearance: 47.6 mL/min (by C-G formula based on SCr of 0.63 mg/dL). Liver Function Tests: No results for input(s): "AST", "ALT", "ALKPHOS", "BILITOT", "PROT", "ALBUMIN" in the last 168 hours.  No results for input(s): "LIPASE", "AMYLASE" in the last 168 hours. No results for input(s): "AMMONIA" in the last 168 hours. Coagulation profile No results for input(s): "INR", "PROTIME" in the last 168 hours.  CBC: Recent Labs  Lab 07/17/23 0457 07/19/23 0535  WBC 10.0 8.6  HGB 8.8*  8.7*  HCT 28.0* 27.1*  MCV 90.3 88.9  PLT 384 367   Cardiac Enzymes: No results for input(s): "CKTOTAL", "CKMB", "CKMBINDEX", "TROPONINI" in the last 168 hours. BNP (last 3 results) No results for input(s): "PROBNP" in the last 8760 hours. CBG: No results for input(s): "GLUCAP" in the last 168 hours.  D-Dimer: No results for input(s): "DDIMER" in the last 72 hours. Hgb A1c: No results for input(s): "HGBA1C" in the last 72 hours. Lipid Profile: No results for input(s): "CHOL", "HDL", "LDLCALC", "TRIG", "CHOLHDL", "LDLDIRECT" in the last 72 hours. Thyroid function studies: No results for input(s): "TSH", "T4TOTAL", "T3FREE", "THYROIDAB" in the last 72 hours.  Invalid input(s): "FREET3" Anemia work up: No results for input(s): "VITAMINB12", "FOLATE", "FERRITIN", "TIBC", "IRON", "RETICCTPCT" in the last 72 hours. Sepsis Labs: Recent Labs  Lab 07/17/23 0457 07/19/23 0535  WBC 10.0 8.6    Microbiology Recent Results (from the past 240 hours)  Resp panel by RT-PCR (RSV, Flu A&B, Covid) Anterior Nasal Swab     Status: None   Collection Time: 07/15/23  9:20 AM   Specimen: Anterior Nasal Swab  Result Value Ref Range Status   SARS Coronavirus 2 by RT PCR NEGATIVE NEGATIVE Final    Comment: (NOTE) SARS-CoV-2 target nucleic acids are NOT DETECTED.  The SARS-CoV-2 RNA is generally detectable in upper respiratory specimens during the acute phase of infection. The lowest concentration of SARS-CoV-2 viral copies this assay can detect is 138 copies/mL. A negative result does not preclude SARS-Cov-2 infection and should not be used as the sole basis for treatment or other patient management decisions. A negative result may occur with  improper specimen collection/handling, submission of specimen other than nasopharyngeal swab, presence of viral mutation(s) within the areas targeted by this assay, and inadequate number of viral copies(<138 copies/mL). A negative result must be combined  with clinical observations, patient history, and epidemiological information. The expected result is Negative.  Fact Sheet for Patients:  BloggerCourse.com  Fact Sheet for Healthcare Providers:  SeriousBroker.it  This test is no t yet approved or cleared by the [place] FDA and  has been authorized for detection and/or diagnosis of SARS-CoV-2 by FDA under an Emergency Use Authorization (EUA). This EUA will remain  in effect (meaning this test can be used) for the duration of the COVID-19 declaration under Section 564(b)(1) of the Act, 21 U.S.C.section 360bbb-3(b)(1), unless the authorization is terminated  or revoked sooner.       Influenza A by PCR NEGATIVE NEGATIVE Final   Influenza B by PCR NEGATIVE NEGATIVE Final    Comment: (NOTE) The Xpert Xpress SARS-CoV-2/FLU/RSV plus assay is intended as an aid in the diagnosis of influenza from Nasopharyngeal swab specimens and should not be used as a sole basis for treatment. Nasal washings and aspirates are unacceptable for Xpert Xpress SARS-CoV-2/FLU/RSV testing.  Fact Sheet for Patients: BloggerCourse.com  Fact Sheet for Healthcare Providers: SeriousBroker.it  This test is not yet approved or cleared by the [place] FDA and has been authorized for detection and/or diagnosis of SARS-CoV-2 by FDA under an Emergency Use Authorization (EUA). This EUA will remain in effect (meaning this test can be used) for the duration of the COVID-19 declaration under Section 564(b)(1) of the Act, 21 U.S.C. section 360bbb-3(b)(1), unless the authorization is terminated or revoked.     Resp Syncytial Virus by PCR NEGATIVE NEGATIVE Final    Comment: (NOTE) Fact Sheet for Patients: BloggerCourse.com  Fact Sheet for Healthcare Providers: SeriousBroker.it  This test is not yet approved  or cleared by the [place] FDA and has been authorized for detection and/or diagnosis of SARS-CoV-2 by FDA under an Emergency Use Authorization (EUA). This EUA will remain in effect (meaning this test can be used) for the duration of the COVID-19 declaration under Section 564(b)(1) of the Act, 21 U.S.C.  section 360bbb-3(b)(1), unless the authorization is terminated or revoked.  Performed at [facility], [address] [place], [place] [address]   Blood culture (routine x 2)     Status: None   Collection Time: 07/15/23 10:37 AM   Specimen: BLOOD  Result Value Ref Range Status   Specimen Description BLOOD LEFT ANTECUBITAL  Final   Special Requests   Final    BOTTLES DRAWN AEROBIC AND ANAEROBIC Blood Culture results may not be optimal due to an inadequate volume of blood received in culture bottles   Culture   Final    NO GROWTH 5 DAYS Performed at [facility], [address] [place], [place] [address]    Report Status 07/20/2023 FINAL  Final  Blood culture (routine x 2)     Status: None   Collection Time: 07/15/23 10:38 AM   Specimen: BLOOD  Result Value Ref Range Status   Specimen Description BLOOD BLOOD RIGHT WRIST  Final   Special Requests   Final    BOTTLES DRAWN AEROBIC AND ANAEROBIC Blood Culture results may not be optimal due to an inadequate volume of blood received in culture bottles   Culture   Final    NO GROWTH 5 DAYS Performed at [facility], [address] [place], [place] [address]    Report Status 07/20/2023 FINAL  Final  Respiratory (~20 pathogens) panel by PCR     Status: None   Collection Time: 07/15/23  5:39 PM   Specimen: Nasopharyngeal Swab; Respiratory  Result Value Ref Range Status   Adenovirus NOT DETECTED NOT DETECTED Final   Coronavirus 229E NOT DETECTED NOT DETECTED Final    Comment: (NOTE) The Coronavirus on the Respiratory Panel, DOES NOT test for the novel  Coronavirus (2019 nCoV)    Coronavirus HKU1  NOT DETECTED NOT DETECTED Final   Coronavirus NL63 NOT DETECTED NOT DETECTED Final   Coronavirus OC43 NOT DETECTED NOT DETECTED Final   Metapneumovirus NOT DETECTED NOT DETECTED Final   Rhinovirus / Enterovirus NOT DETECTED NOT DETECTED Final   Influenza A NOT DETECTED NOT DETECTED Final   Influenza B NOT DETECTED NOT DETECTED Final   Parainfluenza Virus 1 NOT DETECTED NOT DETECTED Final   Parainfluenza Virus 2 NOT DETECTED NOT DETECTED Final   Parainfluenza Virus 3 NOT DETECTED NOT DETECTED Final   Parainfluenza Virus 4 NOT DETECTED NOT DETECTED Final   Respiratory Syncytial Virus NOT DETECTED NOT DETECTED Final   Bordetella pertussis NOT DETECTED NOT DETECTED Final   Bordetella Parapertussis NOT DETECTED NOT DETECTED Final   Chlamydophila pneumoniae NOT DETECTED NOT DETECTED Final   Mycoplasma pneumoniae NOT DETECTED NOT DETECTED Final    Comment: Performed at [facility], [address] [place], [place] [address]  MRSA Next Gen by PCR, Nasal     Status: None   Collection Time: 07/15/23  5:39 PM   Specimen: Nasopharyngeal Swab; Nasal Swab  Result Value Ref Range Status   MRSA by PCR Next Gen NOT DETECTED NOT DETECTED Final    Comment: (NOTE) The GeneXpert MRSA Assay (FDA approved for NASAL specimens only), is one component of a comprehensive MRSA colonization surveillance program. It is not intended to diagnose MRSA infection nor to guide or monitor treatment for MRSA infections. Test performance is not FDA approved in patients less than 72 years old. Performed at [facility], [address] [place], [place] [address]     Procedures and diagnostic studies:  No results found.  LOS: 8 days   Shenice Dolder  [facility]   Pager on [email or web address]. If 7PM-7AM, please contact night-coverage at [email or web address]     07/23/2023, 3:10 PM

## 2023-07-23 NOTE — Progress Notes (Addendum)
Physical Therapy Treatment Patient Details Name: Terri Burns MRN: [id number] DOB: [birth year] Today's Date: 07/23/2023   History of Present Illness Pt is a 73 y.o. female presenting to [facility] ED with increased SOB, workup for COPD exacerbation. PMH significant for chronic HFpEF, CAD status post stenting on Brilinta, iron deficiency anemia, anxiety/depression, GERD, HTN, & HLD, COPD.    PT Comments  Pt in recliner.  Voices some increased anxiety today over her medical condition/decline/weakness.  She is able to stand with cga/min a x 1 and complete x 2 large laps in room with RW and cga/min a x 1 and assit for O2 tank.  Gait remains generally weak and unsteady needing +1 assist at all times which is not her baseline. Voids on BSC before returning to recliner.  Son in to visit at end of session.     If plan is discharge home, recommend the following: A little help with walking and/or transfers;A little help with bathing/dressing/bathroom;Assist for transportation;Help with stairs or ramp for entrance;Assistance with cooking/housework   Can travel by private vehicle        Equipment Recommendations  None recommended by PT    Recommendations for Other Services       Precautions / Restrictions Precautions Precautions: Fall Precaution/Restrictions Comments: watch O2 Restrictions Weight Bearing Restrictions Per Provider Order: No     Mobility  Bed Mobility                    Transfers Overall transfer level: Needs assistance Equipment used: Rolling walker (2 wheels) Transfers: Sit to/from Stand Sit to Stand: Contact guard assist           General transfer comment: voices anxiety over movement today.    Ambulation/Gait Ambulation/Gait assistance: Contact guard assist Gait Distance (Feet): 60 Feet Assistive device: Rolling walker (2 wheels) Gait Pattern/deviations: Step-through pattern, Decreased step length - right, Decreased step length - left Gait velocity:  Decreased     General Gait Details: 2 large laps in room with RW and cga x 1 with assist for O2 tubing   Stairs             Wheelchair Mobility     Tilt Bed    Modified Rankin (Stroke Patients Only)       Balance Overall balance assessment: Needs assistance Sitting-balance support: Feet supported, No upper extremity supported Sitting balance-Leahy Scale: Good     Standing balance support: Bilateral upper extremity supported, During functional activity, Reliant on assistive device for balance Standing balance-Leahy Scale: Fair                              Communication    Cognition Arousal: Alert Behavior During Therapy: WFL for tasks assessed/performed   PT - Cognitive impairments: No apparent impairments                                Cueing    Exercises Other Exercises Other Exercises: to bSC to void    General Comments        Pertinent Vitals/Pain Pain Assessment Pain Assessment: No/denies pain Pain Descriptors / Indicators: Sore, Aching Pain Intervention(s): Repositioned, Monitored during session    Home Living                          Prior Function  PT Goals (current goals can now be found in the care plan section) Progress towards PT goals: Progressing toward goals    Frequency    Min 1X/week      PT Plan      Co-evaluation              AM-PAC PT "6 Clicks" Mobility   Outcome Measure  Help needed turning from your back to your side while in a flat bed without using bedrails?: A Little Help needed moving from lying on your back to sitting on the side of a flat bed without using bedrails?: A Little Help needed moving to and from a bed to a chair (including a wheelchair)?: A Little Help needed standing up from a chair using your arms (e.g., wheelchair or bedside chair)?: A Little Help needed to walk in hospital room?: A Little Help needed climbing 3-5 steps with a railing? : A  Lot 6 Click Score: 17    End of Session Equipment Utilized During Treatment: Gait belt;Oxygen Activity Tolerance: Patient tolerated treatment well Patient left: with call bell/phone within reach;in chair;with chair alarm set;with family/visitor present Nurse Communication: Mobility status PT Visit Diagnosis: Muscle weakness (generalized) (M62.81);Unsteadiness on feet (R26.81);Difficulty in walking, not elsewhere classified (R26.2)     Time: 1020-1031 PT Time Calculation (min) (ACUTE ONLY): 11 min  Charges:    $Gait Training: 8-22 mins PT General Charges $$ ACUTE PT VISIT: 1 Visit                   Danielle Dess, PTA 07/23/23, 10:40 AM

## 2023-07-23 NOTE — Plan of Care (Signed)
  Problem: Health Behavior/Discharge Planning: Goal: Ability to manage health-related needs will improve Outcome: Progressing   Problem: Clinical Measurements: Goal: Diagnostic test results will improve Outcome: Progressing Goal: Respiratory complications will improve Outcome: Progressing   Problem: Activity: Goal: Risk for activity intolerance will decrease Outcome: Progressing   Problem: Nutrition: Goal: Adequate nutrition will be maintained Outcome: Progressing   Problem: Coping: Goal: Level of anxiety will decrease Outcome: Progressing   Problem: Pain Managment: Goal: General experience of comfort will improve and/or be controlled Outcome: Progressing   Problem: Safety: Goal: Ability to remain free from injury will improve Outcome: Progressing   Problem: Activity: Goal: Ability to tolerate increased activity will improve Outcome: Progressing   Problem: Respiratory: Goal: Levels of oxygenation will improve Outcome: Progressing

## 2023-07-24 DIAGNOSIS — J9612 Chronic respiratory failure with hypercapnia: Secondary | ICD-10-CM | POA: Diagnosis not present

## 2023-07-24 DIAGNOSIS — J9601 Acute respiratory failure with hypoxia: Secondary | ICD-10-CM | POA: Diagnosis not present

## 2023-07-24 LAB — MAGNESIUM: Magnesium: 2 mg/dL (ref 1.7–2.4)

## 2023-07-24 LAB — BASIC METABOLIC PANEL
Anion gap: 7 (ref 5–15)
BUN: 18 mg/dL (ref 8–23)
CO2: 30 mmol/L (ref 22–32)
Calcium: 8.2 mg/dL — ABNORMAL LOW (ref 8.9–10.3)
Chloride: 98 mmol/L (ref 98–111)
Creatinine, Ser: 0.67 mg/dL (ref 0.44–1.00)
GFR, Estimated: >60 mL/min (ref >60)
Glucose, Bld: 115 mg/dL — ABNORMAL HIGH (ref 70–99)
Potassium: 3.9 mmol/L (ref 3.5–5.1)
Sodium: 135 mmol/L (ref 135–145)

## 2023-07-24 NOTE — Progress Notes (Signed)
Progress Note    Terri Burns  SAY:[id number] DOB: Nov 13, 1950  DOA: 07/15/2023 PCP: Louis Matte, MD      Brief Narrative:    Medical records reviewed and are as summarized below:  Terri Burns is a 73 y.o. female with history of severe COPD, hyperlipidemia, depression, anxiety, hypertension, GERD, presented to the hospital with shortness of breath.    Assessment/Plan:   Principal Problem:   Acute hypoxic on chronic hypercapnic respiratory failure (HCC) Active Problems:   COPD exacerbation (HCC)   Acute on chronic diastolic CHF (congestive heart failure) (HCC)   Anxiety and depression   GERD (gastroesophageal reflux disease)   Benign essential HTN   Senile purpura (HCC)   Depression   HLD (hyperlipidemia)   Nicotine dependence, cigarettes, uncomplicated   Centrilobular emphysema (HCC)   Coronary artery disease involving native coronary artery of native heart without angina pectoris   Elevated troponin    Body mass index is 25.48 kg/m.   Acute exacerbation of severe COPD: Improved.  Use DuoNeb as needed.  Continue triple inhaler therapy.   Completed 5 days of steroids, IV azithromycin and IV cefepime on 07/19/2023.   Outpatient follow-up with Dr. Belia Heman, pulmonologist, on 08/09/2023 IV Lasix has been discontinued. BMP on 07/24/2023 showed normal creatinine, sodium, potassium and magnesium levels.   Acute on chronic hypoxemic respiratory failure:  ABG on 07/19/2023 showed pH of 7.4, pCO2 43, pO2 111, bicarb of 26.6, O2 sat 98.4% on 2 L of oxygen. Unfortunately ABG results will not qualify her for home BiPAP. Continue 2 L/min oxygen via nasal cannula.   Elevated troponin: This is likely from demand ischemia. CAD: Continue aspirin, Brilinta and atorvastatin   Tobacco use disorder: Continue nicotine patch.  Counseled to quit smoking cigarettes   Comorbidities include anxiety, depression, hypertension  Medically stable for discharge but awaiting  placement to SNF  Diet Order             Diet heart healthy/carb modified Fluid consistency: Thin  Diet effective now                            Consultants: Pulmonologist  Procedures: None    Medications:    aspirin EC  81 mg Oral Daily   atorvastatin  80 mg Oral QHS   citalopram  30 mg Oral Daily   cyanocobalamin  1,000 mcg Oral Daily   ferrous sulfate  325 mg Oral QAC breakfast   fluticasone furoate-vilanterol  1 puff Inhalation Daily   And   umeclidinium bromide  1 puff Inhalation Daily   heparin  5,000 Units Subcutaneous Q8H   ipratropium-albuterol  3 mL Nebulization TID   losartan  25 mg Oral QPM   metoprolol succinate  12.5 mg Oral QPM   pantoprazole  40 mg Oral BID   ticagrelor  90 mg Oral BID   Continuous Infusions:     Anti-infectives (From admission, onward)    Start     Dose/Rate Route Frequency Ordered Stop   07/16/23 1000  azithromycin (ZITHROMAX) 500 mg in sodium chloride 0.9 % 250 mL IVPB        500 mg 250 mL/hr over 60 Minutes Intravenous Every 24 hours 07/15/23 1521 07/19/23 1150   07/16/23 1000  cefTRIAXone (ROCEPHIN) 2 g in sodium chloride 0.9 % 100 mL IVPB  Status:  Discontinued        2 g 200 mL/hr over 30 Minutes  Intravenous Every 24 hours 07/15/23 1711 07/15/23 1726   07/15/23 1800  ceFEPIme (MAXIPIME) 2 g in sodium chloride 0.9 % 100 mL IVPB  Status:  Discontinued        2 g 200 mL/hr over 30 Minutes Intravenous Every 12 hours 07/15/23 1738 07/20/23 1554   07/15/23 1015  cefTRIAXone (ROCEPHIN) 2 g in sodium chloride 0.9 % 100 mL IVPB        2 g 200 mL/hr over 30 Minutes Intravenous Once 07/15/23 1002 07/15/23 1206   07/15/23 1015  azithromycin (ZITHROMAX) 500 mg in sodium chloride 0.9 % 250 mL IVPB        500 mg 250 mL/hr over 60 Minutes Intravenous  Once 07/15/23 1002 07/15/23 1206              Family Communication/Anticipated D/C date and plan/Code Status   DVT prophylaxis: heparin injection 5,000 Units  Start: 07/15/23 2200 Place TED hose Start: 07/15/23 1520     Code Status: Limited: Do not attempt resuscitation (DNR) -DNR-LIMITED -Do Not Intubate/DNI   Family Communication: None Disposition Plan: Plan to discharge to SNF   Status is: Inpatient Remains inpatient appropriate because: Awaiting placement to SNF       Subjective:   No acute events overnight.  No complaints.  She said her son was here this morning to inquire about update on placement to SNF.  Objective:    Vitals:   07/23/23 1800 07/23/23 1938 07/23/23 2206 07/24/23 0802  BP: 105/72  106/84 (!) 111/90  Pulse: 76  79 70  Resp:   18 17  Temp:   98.3 F (36.8 C) 97.9 F (36.6 C)  TempSrc:   Oral Oral  SpO2:  98% 100% 100%  Weight:      Height:       No data found.  No intake or output data in the 24 hours ending 07/24/23 1546  Filed Weights   07/15/23 0912  Weight: 55.3 kg    Exam:  GEN: NAD SKIN: Warm and dry EYES: No pallor or icterus ENT: MMM CV: RRR PULM: Occasional rhonchi ABD: soft, ND, NT, +BS CNS: AAO x 3, non focal EXT: No edema or tenderness     Data Reviewed:   I have personally reviewed following labs and imaging studies:  Labs: Labs show the following:   Basic Metabolic Panel: Recent Labs  Lab 07/19/23 0535 07/24/23 0900  NA 138 135  K 3.8 3.9  CL 105 98  CO2 26 30  GLUCOSE 76 115*  BUN 24* 18  CREATININE 0.63 0.67  CALCIUM 8.1* 8.2*  MG  --  2.0   GFR Estimated Creatinine Clearance: 47.6 mL/min (by C-G formula based on SCr of 0.67 mg/dL). Liver Function Tests: No results for input(s): "AST", "ALT", "ALKPHOS", "BILITOT", "PROT", "ALBUMIN" in the last 168 hours.  No results for input(s): "LIPASE", "AMYLASE" in the last 168 hours. No results for input(s): "AMMONIA" in the last 168 hours. Coagulation profile No results for input(s): "INR", "PROTIME" in the last 168 hours.  CBC: Recent Labs  Lab 07/19/23 0535  WBC 8.6  HGB 8.7*  HCT 27.1*  MCV  88.9  PLT 367   Cardiac Enzymes: No results for input(s): "CKTOTAL", "CKMB", "CKMBINDEX", "TROPONINI" in the last 168 hours. BNP (last 3 results) No results for input(s): "PROBNP" in the last 8760 hours. CBG: No results for input(s): "GLUCAP" in the last 168 hours. D-Dimer: No results for input(s): "DDIMER" in the last 72 hours. Hgb A1c:  No results for input(s): "HGBA1C" in the last 72 hours. Lipid Profile: No results for input(s): "CHOL", "HDL", "LDLCALC", "TRIG", "CHOLHDL", "LDLDIRECT" in the last 72 hours. Thyroid function studies: No results for input(s): "TSH", "T4TOTAL", "T3FREE", "THYROIDAB" in the last 72 hours.  Invalid input(s): "FREET3" Anemia work up: No results for input(s): "VITAMINB12", "FOLATE", "FERRITIN", "TIBC", "IRON", "RETICCTPCT" in the last 72 hours. Sepsis Labs: Recent Labs  Lab 07/19/23 0535  WBC 8.6    Microbiology Recent Results (from the past 240 hours)  Resp panel by RT-PCR (RSV, Flu A&B, Covid) Anterior Nasal Swab     Status: None   Collection Time: 07/15/23  9:20 AM   Specimen: Anterior Nasal Swab  Result Value Ref Range Status   SARS Coronavirus 2 by RT PCR NEGATIVE NEGATIVE Final    Comment: (NOTE) SARS-CoV-2 target nucleic acids are NOT DETECTED.  The SARS-CoV-2 RNA is generally detectable in upper respiratory specimens during the acute phase of infection. The lowest concentration of SARS-CoV-2 viral copies this assay can detect is 138 copies/mL. A negative result does not preclude SARS-Cov-2 infection and should not be used as the sole basis for treatment or other patient management decisions. A negative result may occur with  improper specimen collection/handling, submission of specimen other than nasopharyngeal swab, presence of viral mutation(s) within the areas targeted by this assay, and inadequate number of viral copies(<138 copies/mL). A negative result must be combined with clinical observations, patient history, and  epidemiological information. The expected result is Negative.  Fact Sheet for Patients:  BloggerCourse.com  Fact Sheet for Healthcare Providers:  SeriousBroker.it  This test is no t yet approved or cleared by the [place] FDA and  has been authorized for detection and/or diagnosis of SARS-CoV-2 by FDA under an Emergency Use Authorization (EUA). This EUA will remain  in effect (meaning this test can be used) for the duration of the COVID-19 declaration under Section 564(b)(1) of the Act, 21 U.S.C.section 360bbb-3(b)(1), unless the authorization is terminated  or revoked sooner.       Influenza A by PCR NEGATIVE NEGATIVE Final   Influenza B by PCR NEGATIVE NEGATIVE Final    Comment: (NOTE) The Xpert Xpress SARS-CoV-2/FLU/RSV plus assay is intended as an aid in the diagnosis of influenza from Nasopharyngeal swab specimens and should not be used as a sole basis for treatment. Nasal washings and aspirates are unacceptable for Xpert Xpress SARS-CoV-2/FLU/RSV testing.  Fact Sheet for Patients: BloggerCourse.com  Fact Sheet for Healthcare Providers: SeriousBroker.it  This test is not yet approved or cleared by the [place] FDA and has been authorized for detection and/or diagnosis of SARS-CoV-2 by FDA under an Emergency Use Authorization (EUA). This EUA will remain in effect (meaning this test can be used) for the duration of the COVID-19 declaration under Section 564(b)(1) of the Act, 21 U.S.C. section 360bbb-3(b)(1), unless the authorization is terminated or revoked.     Resp Syncytial Virus by PCR NEGATIVE NEGATIVE Final    Comment: (NOTE) Fact Sheet for Patients: BloggerCourse.com  Fact Sheet for Healthcare Providers: SeriousBroker.it  This test is not yet approved or cleared by the [place] FDA and has been  authorized for detection and/or diagnosis of SARS-CoV-2 by FDA under an Emergency Use Authorization (EUA). This EUA will remain in effect (meaning this test can be used) for the duration of the COVID-19 declaration under Section 564(b)(1) of the Act, 21 U.S.C. section 360bbb-3(b)(1), unless the authorization is terminated or revoked.  Performed at [facility], [phone] [place], [place] [address]   Blood culture (routine x 2)     Status: None   Collection Time: 07/15/23 10:37 AM   Specimen: BLOOD  Result Value Ref Range Status   Specimen Description BLOOD LEFT ANTECUBITAL  Final   Special Requests   Final    BOTTLES DRAWN AEROBIC AND ANAEROBIC Blood Culture results may not be optimal due to an inadequate volume of blood received in culture bottles   Culture   Final    NO GROWTH 5 DAYS Performed at [facility], [address] [place], [place] [address]    Report Status 07/20/2023 FINAL  Final  Blood culture (routine x 2)     Status: None   Collection Time: 07/15/23 10:38 AM   Specimen: BLOOD  Result Value Ref Range Status   Specimen Description BLOOD BLOOD RIGHT WRIST  Final   Special Requests   Final    BOTTLES DRAWN AEROBIC AND ANAEROBIC Blood Culture results may not be optimal due to an inadequate volume of blood received in culture bottles   Culture   Final    NO GROWTH 5 DAYS Performed at [facility], [address] [place], [place] [address]    Report Status 07/20/2023 FINAL  Final  Respiratory (~20 pathogens) panel by PCR     Status: None   Collection Time: 07/15/23  5:39 PM   Specimen: Nasopharyngeal Swab; Respiratory  Result Value Ref Range Status   Adenovirus NOT DETECTED NOT DETECTED Final   Coronavirus 229E NOT DETECTED NOT DETECTED Final    Comment: (NOTE) The Coronavirus on the Respiratory Panel, DOES NOT test for the novel  Coronavirus (2019 nCoV)    Coronavirus HKU1 NOT DETECTED NOT DETECTED Final   Coronavirus NL63  NOT DETECTED NOT DETECTED Final   Coronavirus OC43 NOT DETECTED NOT DETECTED Final   Metapneumovirus NOT DETECTED NOT DETECTED Final   Rhinovirus / Enterovirus NOT DETECTED NOT DETECTED Final   Influenza A NOT DETECTED NOT DETECTED Final   Influenza B NOT DETECTED NOT DETECTED Final   Parainfluenza Virus 1 NOT DETECTED NOT DETECTED Final   Parainfluenza Virus 2 NOT DETECTED NOT DETECTED Final   Parainfluenza Virus 3 NOT DETECTED NOT DETECTED Final   Parainfluenza Virus 4 NOT DETECTED NOT DETECTED Final   Respiratory Syncytial Virus NOT DETECTED NOT DETECTED Final   Bordetella pertussis NOT DETECTED NOT DETECTED Final   Bordetella Parapertussis NOT DETECTED NOT DETECTED Final   Chlamydophila pneumoniae NOT DETECTED NOT DETECTED Final   Mycoplasma pneumoniae NOT DETECTED NOT DETECTED Final    Comment: Performed at [facility], [address] [place], [place] [address]  MRSA Next Gen by PCR, Nasal     Status: None   Collection Time: 07/15/23  5:39 PM   Specimen: Nasopharyngeal Swab; Nasal Swab  Result Value Ref Range Status   MRSA by PCR Next Gen NOT DETECTED NOT DETECTED Final    Comment: (NOTE) The GeneXpert MRSA Assay (FDA approved for NASAL specimens only), is one component of a comprehensive MRSA colonization surveillance program. It is not intended to diagnose MRSA infection nor to guide or monitor treatment for MRSA infections. Test performance is not FDA approved in patients less than 80 years old. Performed at [facility], [address] [place], [place] [address]     Procedures and diagnostic studies:  No results found.             LOS: 9 days   Ondrea Dow  Laylee Schooley  [employer] loss adjuster on [email or web address]. If 7PM-7AM, please contact night-coverage at [email or web address]     07/24/2023, 3:46 PM

## 2023-07-24 NOTE — Progress Notes (Signed)
Mobility Specialist - Progress Note   07/24/23 1603  Mobility  Activity Transferred from chair to bed  Level of Assistance Standby assist, set-up cues, supervision of patient - no hands on  Assistive Device Front wheel walker  Distance Ambulated (ft) 4 ft  Activity Response Tolerated well  Mobility visit 1 Mobility  Mobility Specialist Start Time (ACUTE ONLY) 1511  Mobility Specialist Stop Time (ACUTE ONLY) 1532  Mobility Specialist Time Calculation (min) (ACUTE ONLY) 21 min   Pt sitting in the recliner upon entry, utilizing 2L. Pt STS to RW and transferred to bed with SBA-- no hands on assist required. Pt left supine with alarm set and needs within reach.  Zetta Bills Mobility Specialist 07/24/23 4:06 PM

## 2023-07-24 NOTE — Plan of Care (Signed)
  Problem: Clinical Measurements: Goal: Diagnostic test results will improve Outcome: Progressing   Problem: Activity: Goal: Risk for activity intolerance will decrease Outcome: Progressing   Problem: Nutrition: Goal: Adequate nutrition will be maintained Outcome: Progressing   Problem: Coping: Goal: Level of anxiety will decrease Outcome: Progressing   Problem: Elimination: Goal: Will not experience complications related to bowel motility Outcome: Progressing   Problem: Pain Managment: Goal: General experience of comfort will improve and/or be controlled Outcome: Progressing   Problem: Activity: Goal: Ability to tolerate increased activity will improve Outcome: Progressing   Problem: Respiratory: Goal: Levels of oxygenation will improve Outcome: Progressing

## 2023-07-25 DIAGNOSIS — J9601 Acute respiratory failure with hypoxia: Secondary | ICD-10-CM | POA: Diagnosis not present

## 2023-07-25 DIAGNOSIS — J9612 Chronic respiratory failure with hypercapnia: Secondary | ICD-10-CM | POA: Diagnosis not present

## 2023-07-25 NOTE — Progress Notes (Signed)
Mobility Specialist - Progress Note  Pre-mobility: HR 63, BP, SpO2 99% During mobility: HR 70, BP, SpO2 97%   07/25/23 1538  Mobility  Activity Ambulated with assistance in room  Level of Assistance Contact guard assist, steadying assist  Assistive Device Front wheel walker  Distance Ambulated (ft) 40 ft  Activity Response Tolerated well  Mobility visit 1 Mobility  Mobility Specialist Start Time (ACUTE ONLY) 1455  Mobility Specialist Stop Time (ACUTE ONLY) 1515  Mobility Specialist Time Calculation (min) (ACUTE ONLY) 20 min   Pt sitting in the recliner, utilizing 2L [place]. Pt motivated and agreeable to amb within the room this date. Pt able to scoot towards the edge of the recliner indep, STS to RW MinG-- no physical assist required. Pt stood for >1 min while gathering self. Pt amb to the bathroom door x2 CGA-MinG, taking a seated rest break after each bout. During the first bout of amb Pt expressed 4/10 PRE, feeling slightly SOB-- cuing for PBL. During the second bout of amb, Pt reported feeling better with amb this round compared to the earlier round-- more confident. Pt returned to bed, left simi fowler with alarm set and needs within reach.  Zetta Bills Mobility Specialist 07/25/23 3:59 PM

## 2023-07-25 NOTE — Progress Notes (Signed)
Physical Therapy Treatment Patient Details Name: Terri Burns MRN: [id number] DOB: 04-27-1951 Today's Date: 07/25/2023   History of Present Illness Pt is a 73 y.o. female presenting to [facility] ED with increased SOB, workup for COPD exacerbation. PMH significant for chronic HFpEF, CAD status post stenting on Brilinta, iron deficiency anemia, anxiety/depression, GERD, HTN, & HLD, COPD.    PT Comments  Pt was pleasant and motivated to participate during the session and put forth good effort throughout. Pt required extra time and effort with bed mobility tasks and transfers but no physical assist. Pt was able to perform several bouts of ambulation per below with slow cadence and short bilateral step length but was steady with no overt LOB.  Pt's SpO2 on 2L remained >/= 97% throughout the session with HR WNL and with no adverse symptoms reported by patient.  Pt will benefit from continued PT services upon discharge to safely address deficits listed in patient problem list for decreased caregiver assistance and eventual return to PLOF.      If plan is discharge home, recommend the following: A little help with walking and/or transfers;A little help with bathing/dressing/bathroom;Assist for transportation;Help with stairs or ramp for entrance;Assistance with cooking/housework   Can travel by private vehicle     No  Equipment Recommendations  None recommended by PT    Recommendations for Other Services       Precautions / Restrictions Precautions Precautions: Fall Precaution/Restrictions Comments: watch O2 Restrictions Weight Bearing Restrictions Per Provider Order: No     Mobility  Bed Mobility Overal bed mobility: Modified Independent             General bed mobility comments: Min extra time and effort only    Transfers Overall transfer level: Needs assistance Equipment used: Rolling walker (2 wheels) Transfers: Sit to/from Stand Sit to Stand: Contact guard assist            General transfer comment: Pt able to stand from EOB and recliner with BUE assist and extra effort but no physical assist needed    Ambulation/Gait Ambulation/Gait assistance: Contact guard assist Gait Distance (Feet): 20 Feet x 1, 40 Feet x 2 Assistive device: Rolling walker (2 wheels) Gait Pattern/deviations: Step-through pattern, Decreased step length - right, Decreased step length - left, Trunk flexed Gait velocity: Decreased     General Gait Details: Slow cadence but steady without overt LOB   Stairs             Wheelchair Mobility     Tilt Bed    Modified Rankin (Stroke Patients Only)       Balance Overall balance assessment: Needs assistance Sitting-balance support: Feet supported, No upper extremity supported Sitting balance-Leahy Scale: Good     Standing balance support: Bilateral upper extremity supported, During functional activity, Reliant on assistive device for balance Standing balance-Leahy Scale: Good                              Communication Communication Communication: No apparent difficulties  Cognition Arousal: Alert Behavior During Therapy: WFL for tasks assessed/performed   PT - Cognitive impairments: No apparent impairments                         Following commands: Intact      Cueing Cueing Techniques: Verbal cues, Tactile cues, Visual cues  Exercises Other Exercises Other Exercises: Pt education/review provided on using RPE with goal of  4-5/10 to guide exertion and on general principles of activity progression    General Comments        Pertinent Vitals/Pain Pain Assessment Pain Assessment: 0-10 Pain Score: 6  Pain Location: Chronic back pain per patient Pain Descriptors / Indicators: Sore Pain Intervention(s): Monitored during session, Patient requesting pain meds-RN notified, RN gave pain meds during session    Home Living                          Prior Function             PT Goals (current goals can now be found in the care plan section) Progress towards PT goals: Progressing toward goals    Frequency    Min 1X/week      PT Plan      Co-evaluation              AM-PAC PT "6 Clicks" Mobility   Outcome Measure  Help needed turning from your back to your side while in a flat bed without using bedrails?: A Little Help needed moving from lying on your back to sitting on the side of a flat bed without using bedrails?: A Little Help needed moving to and from a bed to a chair (including a wheelchair)?: A Little Help needed standing up from a chair using your arms (e.g., wheelchair or bedside chair)?: A Little Help needed to walk in hospital room?: A Little Help needed climbing 3-5 steps with a railing? : A Lot 6 Click Score: 17    End of Session Equipment Utilized During Treatment: Gait belt;Oxygen Activity Tolerance: Patient tolerated treatment well Patient left: in chair;with call bell/phone within reach;with chair alarm set Nurse Communication: Mobility status PT Visit Diagnosis: Muscle weakness (generalized) (M62.81);Unsteadiness on feet (R26.81);Difficulty in walking, not elsewhere classified (R26.2)     Time: 1610-9604 PT Time Calculation (min) (ACUTE ONLY): 26 min  Charges:    $Gait Training: 8-22 mins $Therapeutic Activity: 8-22 mins PT General Charges $$ ACUTE PT VISIT: 1 Visit                    D. Scott Anayia Eugene PT, DPT 07/25/23, 11:11 AM

## 2023-07-25 NOTE — Plan of Care (Signed)

## 2023-07-25 NOTE — Progress Notes (Signed)
Progress Note    Terri Burns  ZOX:[id number] DOB: Aug 26, 1950  DOA: 07/15/2023 PCP: Louis Matte, MD      Brief Narrative:    Medical records reviewed and are as summarized below:  Terri Burns is a 73 y.o. female with history of severe COPD, hyperlipidemia, depression, anxiety, hypertension, GERD, presented to the hospital with shortness of breath.    Assessment/Plan:   Principal Problem:   Acute hypoxic on chronic hypercapnic respiratory failure (HCC) Active Problems:   COPD exacerbation (HCC)   Acute on chronic diastolic CHF (congestive heart failure) (HCC)   Anxiety and depression   GERD (gastroesophageal reflux disease)   Benign essential HTN   Senile purpura (HCC)   Depression   HLD (hyperlipidemia)   Nicotine dependence, cigarettes, uncomplicated   Centrilobular emphysema (HCC)   Coronary artery disease involving native coronary artery of native heart without angina pectoris   Elevated troponin    Body mass index is 25.48 kg/m.   Acute exacerbation of severe COPD: Improved.  Use DuoNeb as needed.  Continue triple inhaler therapy.   Completed 5 days of steroids, IV azithromycin and IV cefepime on 07/19/2023.   Outpatient follow-up with Dr. Belia Heman, pulmonologist, on 08/09/2023 Previously treated with IV Lasix. BMP on 07/24/2023 showed normal creatinine, sodium, potassium and magnesium levels.   Acute on chronic hypoxemic respiratory failure:  ABG on 07/19/2023 showed pH of 7.4, pCO2 43, pO2 111, bicarb of 26.6, O2 sat 98.4% on 2 L of oxygen. Unfortunately ABG results will not qualify her for home BiPAP. Continue 2 L/min oxygen via nasal cannula.   Elevated troponin: This is likely from demand ischemia. CAD: Continue aspirin, Brilinta and atorvastatin   Tobacco use disorder: Continue nicotine patch.  Counseled to quit smoking cigarettes   Hypotension, history of hypertension: Discontinue losartan because of dizziness and monitor BP.   Continue low-dose metoprolol.   Comorbidities include anxiety, depression, hypertension   Medically stable for discharge but awaiting placement to SNF  Diet Order             Diet heart healthy/carb modified Fluid consistency: Thin  Diet effective now                            Consultants: Pulmonologist  Procedures: None    Medications:    aspirin EC  81 mg Oral Daily   atorvastatin  80 mg Oral QHS   citalopram  30 mg Oral Daily   cyanocobalamin  1,000 mcg Oral Daily   ferrous sulfate  325 mg Oral QAC breakfast   fluticasone furoate-vilanterol  1 puff Inhalation Daily   And   umeclidinium bromide  1 puff Inhalation Daily   heparin  5,000 Units Subcutaneous Q8H   ipratropium-albuterol  3 mL Nebulization TID   losartan  25 mg Oral QPM   metoprolol succinate  12.5 mg Oral QPM   pantoprazole  40 mg Oral BID   ticagrelor  90 mg Oral BID   Continuous Infusions:     Anti-infectives (From admission, onward)    Start     Dose/Rate Route Frequency Ordered Stop   07/16/23 1000  azithromycin (ZITHROMAX) 500 mg in sodium chloride 0.9 % 250 mL IVPB        500 mg 250 mL/hr over 60 Minutes Intravenous Every 24 hours 07/15/23 1521 07/19/23 1150   07/16/23 1000  cefTRIAXone (ROCEPHIN) 2 g in sodium chloride 0.9 % 100 mL  IVPB  Status:  Discontinued        2 g 200 mL/hr over 30 Minutes Intravenous Every 24 hours 07/15/23 1711 07/15/23 1726   07/15/23 1800  ceFEPIme (MAXIPIME) 2 g in sodium chloride 0.9 % 100 mL IVPB  Status:  Discontinued        2 g 200 mL/hr over 30 Minutes Intravenous Every 12 hours 07/15/23 1738 07/20/23 1554   07/15/23 1015  cefTRIAXone (ROCEPHIN) 2 g in sodium chloride 0.9 % 100 mL IVPB        2 g 200 mL/hr over 30 Minutes Intravenous Once 07/15/23 1002 07/15/23 1206   07/15/23 1015  azithromycin (ZITHROMAX) 500 mg in sodium chloride 0.9 % 250 mL IVPB        500 mg 250 mL/hr over 60 Minutes Intravenous  Once 07/15/23 1002 07/15/23 1206               Family Communication/Anticipated D/C date and plan/Code Status   DVT prophylaxis: heparin injection 5,000 Units Start: 07/15/23 2200 Place TED hose Start: 07/15/23 1520     Code Status: Limited: Do not attempt resuscitation (DNR) -DNR-LIMITED -Do Not Intubate/DNI   Family Communication: None Disposition Plan: Plan to discharge to SNF   Status is: Inpatient Remains inpatient appropriate because: Awaiting placement to SNF       Subjective:   Interval events noted.  She complains of dizziness especially with standing. She said her BP was low this morning.  Elona, RN, was at the bedside   Objective:    Vitals:   07/24/23 1717 07/24/23 1935 07/24/23 2200 07/25/23 0700  BP: (!) 143/95  125/88 98/73  Pulse: 66  75 67  Resp: 18  16 16   Temp: 98.1 F (36.7 C)  97.8 F (36.6 C) 97.8 F (36.6 C)  TempSrc: Oral   Oral  SpO2: 100% 98% 100% 100%  Weight:      Height:       No data found.   Intake/Output Summary (Last 24 hours) at 07/25/2023 1503 Last data filed at 07/25/2023 0900 Gross per 24 hour  Intake 240 ml  Output 325 ml  Net -85 ml    Filed Weights   07/15/23 0912  Weight: 55.3 kg    Exam:  GEN: NAD SKIN: Warm and dry EYES: No pallor or icterus ENT: MMM CV: RRR PULM: CTA B ABD: soft, ND, NT, +BS CNS: AAO x 3, non focal EXT: No edema or tenderness       Data Reviewed:   I have personally reviewed following labs and imaging studies:  Labs: Labs show the following:   Basic Metabolic Panel: Recent Labs  Lab 07/19/23 0535 07/24/23 0900  NA 138 135  K 3.8 3.9  CL 105 98  CO2 26 30  GLUCOSE 76 115*  BUN 24* 18  CREATININE 0.63 0.67  CALCIUM 8.1* 8.2*  MG  --  2.0   GFR Estimated Creatinine Clearance: 47.6 mL/min (by C-G formula based on SCr of 0.67 mg/dL). Liver Function Tests: No results for input(s): "AST", "ALT", "ALKPHOS", "BILITOT", "PROT", "ALBUMIN" in the last 168 hours.  No results for input(s):  "LIPASE", "AMYLASE" in the last 168 hours. No results for input(s): "AMMONIA" in the last 168 hours. Coagulation profile No results for input(s): "INR", "PROTIME" in the last 168 hours.  CBC: Recent Labs  Lab 07/19/23 0535  WBC 8.6  HGB 8.7*  HCT 27.1*  MCV 88.9  PLT 367   Cardiac Enzymes: No results for  input(s): "CKTOTAL", "CKMB", "CKMBINDEX", "TROPONINI" in the last 168 hours. BNP (last 3 results) No results for input(s): "PROBNP" in the last 8760 hours. CBG: No results for input(s): "GLUCAP" in the last 168 hours. D-Dimer: No results for input(s): "DDIMER" in the last 72 hours. Hgb A1c: No results for input(s): "HGBA1C" in the last 72 hours. Lipid Profile: No results for input(s): "CHOL", "HDL", "LDLCALC", "TRIG", "CHOLHDL", "LDLDIRECT" in the last 72 hours. Thyroid function studies: No results for input(s): "TSH", "T4TOTAL", "T3FREE", "THYROIDAB" in the last 72 hours.  Invalid input(s): "FREET3" Anemia work up: No results for input(s): "VITAMINB12", "FOLATE", "FERRITIN", "TIBC", "IRON", "RETICCTPCT" in the last 72 hours. Sepsis Labs: Recent Labs  Lab 07/19/23 0535  WBC 8.6    Microbiology Recent Results (from the past 240 hours)  Respiratory (~20 pathogens) panel by PCR     Status: None   Collection Time: 07/15/23  5:39 PM   Specimen: Nasopharyngeal Swab; Respiratory  Result Value Ref Range Status   Adenovirus NOT DETECTED NOT DETECTED Final   Coronavirus 229E NOT DETECTED NOT DETECTED Final    Comment: (NOTE) The Coronavirus on the Respiratory Panel, DOES NOT test for the novel  Coronavirus (2019 nCoV)    Coronavirus HKU1 NOT DETECTED NOT DETECTED Final   Coronavirus NL63 NOT DETECTED NOT DETECTED Final   Coronavirus OC43 NOT DETECTED NOT DETECTED Final   Metapneumovirus NOT DETECTED NOT DETECTED Final   Rhinovirus / Enterovirus NOT DETECTED NOT DETECTED Final   Influenza A NOT DETECTED NOT DETECTED Final   Influenza B NOT DETECTED NOT DETECTED Final    Parainfluenza Virus 1 NOT DETECTED NOT DETECTED Final   Parainfluenza Virus 2 NOT DETECTED NOT DETECTED Final   Parainfluenza Virus 3 NOT DETECTED NOT DETECTED Final   Parainfluenza Virus 4 NOT DETECTED NOT DETECTED Final   Respiratory Syncytial Virus NOT DETECTED NOT DETECTED Final   Bordetella pertussis NOT DETECTED NOT DETECTED Final   Bordetella Parapertussis NOT DETECTED NOT DETECTED Final   Chlamydophila pneumoniae NOT DETECTED NOT DETECTED Final   Mycoplasma pneumoniae NOT DETECTED NOT DETECTED Final    Comment: Performed at [facility], [address], [place], [place] [address]  MRSA Next Gen by PCR, Nasal     Status: None   Collection Time: 07/15/23  5:39 PM   Specimen: Nasopharyngeal Swab; Nasal Swab  Result Value Ref Range Status   MRSA by PCR Next Gen NOT DETECTED NOT DETECTED Final    Comment: (NOTE) The GeneXpert MRSA Assay (FDA approved for NASAL specimens only), is one component of a comprehensive MRSA colonization surveillance program. It is not intended to diagnose MRSA infection nor to guide or monitor treatment for MRSA infections. Test performance is not FDA approved in patients less than 47 years old. Performed at [facility], [address] [place], [place] [address]     Procedures and diagnostic studies:  No results found.             LOS: 10 days   Khaleef Ruby  [facility]   Pager on [email or web address]. If 7PM-7AM, please contact night-coverage at [email or web address]     07/25/2023, 3:03 PM

## 2023-07-25 NOTE — TOC Progression Note (Signed)
Transition of Care [facility]) - Progression Note    Patient Details  Name: Terri Burns MRN: [id number] Date of Birth: 09/18/1950  [facility]) CM/SW Contact  Marlowe Sax, RN Phone Number: 07/25/2023, 12:41 PM  Clinical Narrative:    Reviewed the bed offers with the patient, she was thrilled that [facility] made an offer and accepted the offer, I notified Dabe at [facility] and Ins Auth is pending        Expected Discharge Plan and Services                                               Social Determinants of Health (SDOH) Interventions SDOH Screenings   Food Insecurity: Food Insecurity Present (07/15/2023)  Housing: Low Risk  (07/15/2023)  Transportation Needs: No Transportation Needs (07/15/2023)  Utilities: Not At Risk (07/15/2023)  Alcohol Screen: Low Risk  (07/13/2021)  Depression (PHQ2-9): Low Risk  (05/11/2022)  Financial Resource Strain: Medium Risk (07/04/2023)  Physical Activity: Insufficiently Active (07/13/2021)  Social Connections: Socially Isolated (07/15/2023)  Stress: No Stress Concern Present (07/13/2021)  Tobacco Use: High Risk (07/15/2023)    Readmission Risk Interventions     No data to display

## 2023-07-26 DIAGNOSIS — F419 Anxiety disorder, unspecified: Secondary | ICD-10-CM | POA: Diagnosis not present

## 2023-07-26 DIAGNOSIS — F32A Depression, unspecified: Secondary | ICD-10-CM

## 2023-07-26 DIAGNOSIS — J441 Chronic obstructive pulmonary disease with (acute) exacerbation: Secondary | ICD-10-CM | POA: Diagnosis not present

## 2023-07-26 DIAGNOSIS — I10 Essential (primary) hypertension: Secondary | ICD-10-CM | POA: Diagnosis not present

## 2023-07-26 DIAGNOSIS — J9601 Acute respiratory failure with hypoxia: Secondary | ICD-10-CM | POA: Diagnosis not present

## 2023-07-26 DIAGNOSIS — J432 Centrilobular emphysema: Secondary | ICD-10-CM

## 2023-07-26 MED ORDER — POLYETHYLENE GLYCOL 3350 17 G PO PACK
17.0000 g | PACK | Freq: Every day | ORAL | Status: DC
  Administered 2023-07-26 – 2023-07-27 (×2): 17 g via ORAL
  Filled 2023-07-26 (×2): qty 1

## 2023-07-26 MED ORDER — DOCUSATE SODIUM 100 MG PO CAPS
100.0000 mg | ORAL_CAPSULE | Freq: Two times a day (BID) | ORAL | Status: DC
  Administered 2023-07-26 – 2023-07-27 (×3): 100 mg via ORAL
  Filled 2023-07-26 (×3): qty 1

## 2023-07-26 MED ORDER — SENNOSIDES-DOCUSATE SODIUM 8.6-50 MG PO TABS
1.0000 | ORAL_TABLET | Freq: Every day | ORAL | Status: DC
  Administered 2023-07-26: 1 via ORAL
  Filled 2023-07-26: qty 1

## 2023-07-26 NOTE — Progress Notes (Signed)
Occupational Therapy Treatment Patient Details Name: Terri Burns MRN: [id number] DOB: 09-27-1950 Today's Date: 07/26/2023   History of present illness Pt is a 73 y.o. female presenting to [facility] ED with increased SOB, workup for COPD exacerbation. PMH significant for chronic HFpEF, CAD status post stenting on Brilinta, iron deficiency anemia, anxiety/depression, GERD, HTN, & HLD, COPD.   OT comments  Chart reviewed to date, pt greeted in bed, agreeable to OT tx session targeting improving functional activity tolerance for improved ADL performance. Overall improved activity tolerance with pt amb to bathroom with RW with MIN A, toilet transfer with MIN A, toileting with supervision. SET UP for grooming tasks. Spo2 >90% on 2 L throughout. Pt is making progress towards goals, discharge remains appropriate.       If plan is discharge home, recommend the following:  A little help with walking and/or transfers;Assistance with cooking/housework;Assist for transportation;Help with stairs or ramp for entrance;A lot of help with bathing/dressing/bathroom   Equipment Recommendations  Other (comment) (defer)    Recommendations for Other Services      Precautions / Restrictions Precautions Precautions: Fall Precaution/Restrictions Comments: watch O2       Mobility Bed Mobility Overal bed mobility: Modified Independent                  Transfers Overall transfer level: Needs assistance Equipment used: Rolling walker (2 wheels) Transfers: Sit to/from Stand Sit to Stand: Contact guard assist-MIN A                 Balance Overall balance assessment: Needs assistance Sitting-balance support: Feet supported, No upper extremity supported Sitting balance-Leahy Scale: Good     Standing balance support: Bilateral upper extremity supported, During functional activity, Reliant on assistive device for balance Standing balance-Leahy Scale: Good                              ADL either performed or assessed with clinical judgement   ADL Overall ADL's : Needs assistance/impaired Eating/Feeding: Set up;Sitting   Grooming: Wash/dry hands;Sitting;Set up                   Toilet Transfer: MIN A;Rolling walker (2 wheels);Ambulation;Regular Toilet   Toileting- Architect and Hygiene: Supervision/safety;Sitting/lateral lean Toileting - Clothing Manipulation Details (indicate cue type and reason): continent urine on toilet     Functional mobility during ADLs: MIN A;Rolling walker (2 wheels) (approx 20' in room)      Extremity/Trunk Assessment              Vision       Perception     Praxis     Communication Communication Communication: No apparent difficulties   Cognition Arousal: Alert Behavior During Therapy: WFL for tasks assessed/performed Cognition: No apparent impairments                               Following commands: Intact        Cueing   Cueing Techniques: Verbal cues, Tactile cues, Visual cues  Exercises Other Exercises Other Exercises: edu re: role of OT, role of rehab, energy conservation techniques, safe ADL participation    Shoulder Instructions       General Comments spo2 >90% on 2 L throughout    Pertinent Vitals/ Pain       Pain Assessment Pain Assessment: 0-10 Pain Score: 7  Pain Location: Chronic  back pain per patient Pain Descriptors / Indicators: Sore Pain Intervention(s): Limited activity within patient's tolerance, Monitored during session, RN gave pain meds during session, Repositioned  Home Living                                          Prior Functioning/Environment              Frequency  Min 1X/week        Progress Toward Goals  OT Goals(current goals can now be found in the care plan section)  Progress towards OT goals: Progressing toward goals  Acute Rehab OT Goals Time For Goal Achievement: 07/31/23  Plan       Co-evaluation                 AM-PAC OT "6 Clicks" Daily Activity     Outcome Measure   Help from another person eating meals?: None Help from another person taking care of personal grooming?: A Little Help from another person toileting, which includes using toliet, bedpan, or urinal?: A Little Help from another person bathing (including washing, rinsing, drying)?: A Little Help from another person to put on and taking off regular upper body clothing?: A Little Help from another person to put on and taking off regular lower body clothing?: A Little 6 Click Score: 19    End of Session Equipment Utilized During Treatment: Oxygen;Rolling walker (2 wheels)  OT Visit Diagnosis: Other abnormalities of gait and mobility (R26.89);Muscle weakness (generalized) (M62.81)   Activity Tolerance Patient tolerated treatment well   Patient Left in chair;with call bell/phone within reach;with chair alarm set   Nurse Communication Mobility status;Patient requests pain meds        Time: 0865-7846 OT Time Calculation (min): 17 min  Charges: OT General Charges $OT Visit: 1 Visit OT Treatments $Self Care/Home Management : 8-22 mins  Oleta Mouse, OTD OTR/L  07/26/23, 12:28 PM

## 2023-07-26 NOTE — Progress Notes (Signed)
Progress Note   Patient: Terri Burns ZOX:[id number] DOB: 04-30-1951 DOA: 07/15/2023     11 DOS: the patient was seen and examined on 07/26/2023   Brief hospital course: Terri Burns is a 73 year old female with history of severe COPD, hyperlipidemia, depression, anxiety, hypertension, GERD, who presents emergency department from home via EMS for chief concerns of shortness of breath.    Patient was initially on BiPAP therapy later oxygen titrated to 2 to 3 L.  She is admitted to the hospitalist service for further management evaluation of acute on chronic hypoxic hypercapnic respiratory failure, COPD exacerbation.  Pulmonology consulted for further management and evaluation of PAP device at night.  Assessment and Plan: Acute hypoxic on chronic hypercapnic respiratory failure (HCC) Acute COPD exacerbation. Continue 2L supplemental oxygen to maintain saturation greater than 92%. Continue DuoNebs as needed. Continue triple inhaler therapy.    Completed 5 days of steroids, IV azithromycin and IV cefepime on 07/19/2023.   Outpatient follow-up with Dr. Belia Heman, pulmonologist, on 08/09/2023. ABG reviewed, patient did not qualify for Bipap. Previously treated with lasix. Monitor daily weights, strict input and output. Encourage incentive spirometry, out of bed to chair. PT OT suggested SNF. TOC working on SNF approval.   Anxiety and depression Continue citalopram 30 mg daily.   Elevated troponin Likely due to demand ischemia Patient denies chest pain.  Low clinical suspicion for ACS at this time.   Coronary artery disease involving native coronary artery of native heart without angina pectoris Continue aspirin, home Brilinta and atorvastatin.   Nicotine dependence, cigarettes, uncomplicated Discussed ill effects of tobacco. Counseled smoking cessation. Nicotine patch as needed.   Benign essential HTN Continue Metoprolol succinate 12.5 mg. Losartan discontinued.       Out of bed to  chair. Incentive spirometry. Nursing supportive care. Fall, aspiration precautions. DVT prophylaxis   Code Status: Limited: Do not attempt resuscitation (DNR) -DNR-LIMITED -Do Not Intubate/DNI   Subjective: Patient is seen and examined today morning. She is lying in bed. Feels better, eating fair. Getting out of bed. Awaiting SNF placement.  Physical Exam: Vitals:   07/26/23 0803 07/26/23 0907 07/26/23 1413 07/26/23 1551  BP:  99/61  118/83  Pulse: 85 66 63 82  Resp: 16 16 16 16   Temp:    98 F (36.7 C)  TempSrc:      SpO2: 96% 100% 96% 100%  Weight:      Height:        General - Elderly Caucasian ill female, no apparent distress HEENT - PERRLA, EOMI, atraumatic head, non tender sinuses. Lung - Clear, diffuse rhonchi, wheezes. Heart - S1, S2 heard, no murmurs, rubs, trace pedal edema. Abdomen - Soft, non tender, bowel sounds good Neuro - Alert, awake and oriented x 3, non focal exam. Skin - Warm and dry.  Data Reviewed:      Latest Ref Rng & Units 07/19/2023    5:35 AM 07/17/2023    4:57 AM 07/16/2023    4:45 AM  CBC  WBC 4.0 - 10.5 K/uL 8.6  10.0  9.4   Hemoglobin 12.0 - 15.0 g/dL 8.7  8.8  8.6   Hematocrit 36.0 - 46.0 % 27.1  28.0  27.0   Platelets 150 - 400 K/uL 367  384  343       Latest Ref Rng & Units 07/24/2023    9:00 AM 07/19/2023    5:35 AM 07/17/2023    4:57 AM  BMP  Glucose 70 - 99 mg/dL 811  76  86   BUN 8 - 23 mg/dL 18  24  17    Creatinine 0.44 - 1.00 mg/dL 1.61  0.96  0.45   Sodium 135 - 145 mmol/L 135  138  139   Potassium 3.5 - 5.1 mmol/L 3.9  3.8  3.8   Chloride 98 - 111 mmol/L 98  105  105   CO2 22 - 32 mmol/L 30  26  28    Calcium 8.9 - 10.3 mg/dL 8.2  8.1  8.1    No results found.  Family Communication: Discussed with patient, she understand and agree. All questions answereed.  Disposition: Status is: Inpatient Remains inpatient appropriate because: SNF placement, insurance approval  Planned Discharge Destination: Skilled nursing  facility     Time spent: 38 minutes  Author: Marcelino Duster, MD 07/26/2023 4:42 PM Secure chat 7am to 7pm For on call review [email or web address].

## 2023-07-26 NOTE — TOC Progression Note (Signed)
Transition of Care [facility]) - Progression Note    Patient Details  Name: Terri Burns MRN: [id number] Date of Birth: 01-23-1951  Transition of Care [facility]) CM/SW Contact  Marlowe Sax, RN Phone Number: 07/26/2023, 1:50 PM  Clinical Narrative:     [facility] ID: [id number] approved will dc tomorrow        Expected Discharge Plan and Services                                               Social Determinants of Health (SDOH) Interventions SDOH Screenings   Food Insecurity: Food Insecurity Present (07/15/2023)  Housing: Low Risk  (07/15/2023)  Transportation Needs: No Transportation Needs (07/15/2023)  Utilities: Not At Risk (07/15/2023)  Alcohol Screen: Low Risk  (07/13/2021)  Depression (PHQ2-9): Low Risk  (05/11/2022)  Financial Resource Strain: Medium Risk (07/04/2023)  Physical Activity: Insufficiently Active (07/13/2021)  Social Connections: Socially Isolated (07/15/2023)  Stress: No Stress Concern Present (07/13/2021)  Tobacco Use: High Risk (07/15/2023)    Readmission Risk Interventions     No data to display

## 2023-07-26 NOTE — Plan of Care (Signed)

## 2023-07-27 ENCOUNTER — Other Ambulatory Visit: Payer: Self-pay

## 2023-07-27 DIAGNOSIS — J441 Chronic obstructive pulmonary disease with (acute) exacerbation: Secondary | ICD-10-CM | POA: Diagnosis not present

## 2023-07-27 DIAGNOSIS — F17210 Nicotine dependence, cigarettes, uncomplicated: Secondary | ICD-10-CM | POA: Diagnosis not present

## 2023-07-27 DIAGNOSIS — J432 Centrilobular emphysema: Secondary | ICD-10-CM | POA: Diagnosis not present

## 2023-07-27 DIAGNOSIS — J9601 Acute respiratory failure with hypoxia: Secondary | ICD-10-CM | POA: Diagnosis not present

## 2023-07-27 MED ORDER — CYANOCOBALAMIN 1000 MCG PO TABS
1000.0000 ug | ORAL_TABLET | Freq: Every day | ORAL | 2 refills | Status: AC
  Filled 2023-07-27: qty 7, 7d supply, fill #0

## 2023-07-27 MED ORDER — METOPROLOL SUCCINATE ER 25 MG PO TB24
12.5000 mg | ORAL_TABLET | Freq: Every evening | ORAL | 1 refills | Status: AC
  Filled 2023-07-27: qty 4, 8d supply, fill #0

## 2023-07-27 MED ORDER — IPRATROPIUM-ALBUTEROL 0.5-2.5 (3) MG/3ML IN SOLN
3.0000 mL | Freq: Four times a day (QID) | RESPIRATORY_TRACT | 0 refills | Status: AC | PRN
  Filled 2023-07-27: qty 90, 8d supply, fill #0

## 2023-07-27 MED ORDER — ATORVASTATIN CALCIUM 40 MG PO TABS
80.0000 mg | ORAL_TABLET | Freq: Every day | ORAL | 1 refills | Status: AC
  Filled 2023-07-27: qty 14, 7d supply, fill #0

## 2023-07-27 MED ORDER — DOCUSATE SODIUM 100 MG PO CAPS
100.0000 mg | ORAL_CAPSULE | Freq: Two times a day (BID) | ORAL | 0 refills | Status: AC
  Filled 2023-07-27: qty 10, 5d supply, fill #0

## 2023-07-27 MED ORDER — SENNOSIDES-DOCUSATE SODIUM 8.6-50 MG PO TABS: 1.0000 | ORAL_TABLET | Freq: Every day | ORAL | 0 refills | Status: DC

## 2023-07-27 MED ORDER — SENNOSIDES-DOCUSATE SODIUM 8.6-50 MG PO TABS
1.0000 | ORAL_TABLET | Freq: Every day | ORAL | 0 refills | Status: AC
  Filled 2023-07-27: qty 7, 7d supply, fill #0

## 2023-07-27 MED ORDER — HYDROCODONE-ACETAMINOPHEN 10-325 MG PO TABS: 1.0000 | ORAL_TABLET | Freq: Four times a day (QID) | ORAL | 0 refills | Status: AC | PRN

## 2023-07-27 MED ORDER — FLUTICASONE PROPIONATE 50 MCG/ACT NA SUSP
2.0000 | Freq: Every day | NASAL | 1 refills | Status: AC
  Filled 2023-07-27: qty 16, 30d supply, fill #0

## 2023-07-27 MED ORDER — PANTOPRAZOLE SODIUM 40 MG PO TBEC
40.0000 mg | DELAYED_RELEASE_TABLET | Freq: Two times a day (BID) | ORAL | 1 refills | Status: AC
Start: 2023-07-27 — End: ?
  Filled 2023-07-27: qty 14, 7d supply, fill #0
  Filled 2023-07-27: qty 90, 45d supply, fill #0

## 2023-07-27 MED ORDER — DOCUSATE SODIUM 100 MG PO CAPS: 100.0000 mg | ORAL_CAPSULE | Freq: Two times a day (BID) | ORAL | 0 refills | Status: DC

## 2023-07-27 MED ORDER — HYDROXYZINE HCL 25 MG PO TABS
25.0000 mg | ORAL_TABLET | Freq: Every day | ORAL | 0 refills | Status: AC
  Filled 2023-07-27: qty 7, 7d supply, fill #0
  Filled 2023-07-27: qty 30, 30d supply, fill #0

## 2023-07-27 MED ORDER — TICAGRELOR 90 MG PO TABS
90.0000 mg | ORAL_TABLET | Freq: Two times a day (BID) | ORAL | 0 refills | Status: AC
  Filled 2023-07-27: qty 14, 7d supply, fill #0

## 2023-07-27 MED ORDER — CITALOPRAM HYDROBROMIDE 20 MG PO TABS
30.0000 mg | ORAL_TABLET | Freq: Every day | ORAL | 0 refills | Status: AC
  Filled 2023-07-27: qty 11, 7d supply, fill #0

## 2023-07-27 MED ORDER — LORATADINE 10 MG PO TABS
10.0000 mg | ORAL_TABLET | Freq: Every day | ORAL | 1 refills | Status: AC
Start: 2023-07-27 — End: ?
  Filled 2023-07-27: qty 7, 7d supply, fill #0

## 2023-07-27 MED ORDER — FERROUS SULFATE 325 (65 FE) MG PO TABS
325.0000 mg | ORAL_TABLET | Freq: Every day | ORAL | 2 refills | Status: AC
  Filled 2023-07-27: qty 7, 7d supply, fill #0

## 2023-07-27 MED ORDER — ASPIRIN 81 MG PO TBEC
81.0000 mg | DELAYED_RELEASE_TABLET | Freq: Every day | ORAL | 0 refills | Status: AC
  Filled 2023-07-27: qty 7, 7d supply, fill #0

## 2023-07-27 MED ORDER — TRELEGY ELLIPTA 100-62.5-25 MCG/ACT IN AEPB
1.0000 | INHALATION_SPRAY | Freq: Every day | RESPIRATORY_TRACT | 2 refills | Status: AC
  Filled 2023-07-27 (×2): qty 60, 30d supply, fill #0

## 2023-07-27 NOTE — Discharge Summary (Addendum)
Physician Discharge Summary   Patient: Terri Burns MRN: [id number] DOB: [birth year]  Admit date:     07/15/2023  Discharge date: 07/27/23  Discharge Physician: Marcelino Duster   PCP: Louis Matte, MD   Recommendations at discharge:    PCP follow up in 1 week. Pulmonary follow up as scheduled.  Discharge Diagnoses: Principal Problem:   Acute hypoxic on chronic hypercapnic respiratory failure (HCC) Active Problems:   COPD exacerbation (HCC)   Acute on chronic diastolic CHF (congestive heart failure) (HCC)   Anxiety and depression   GERD (gastroesophageal reflux disease)   Benign essential HTN   Senile purpura (HCC)   Depression   HLD (hyperlipidemia)   Nicotine dependence, cigarettes, uncomplicated   Centrilobular emphysema (HCC)   Coronary artery disease involving native coronary artery of native heart without angina pectoris   Elevated troponin  Resolved Problems:   * No resolved hospital problems. [facility] Course: Terri Burns is a 73 year old female with history of severe COPD, hyperlipidemia, depression, anxiety, hypertension, GERD, who presents emergency department from home via EMS for chief concerns of shortness of breath.  Vitals in the ED showed temperature of 97.8, respiration rate of 28, heart rate 61, blood pressure 137/49, SpO2 with MS was reported as 82-84 percent on room air, patient was placed on CPAP therapy and arrived at to the ED with SpO2 of 100%.  On arrival to the ED, PT switch patient to BiPAP therapy.  Serum Na 141, K 4.2, chloride 110, bicarb 22, BUN 13, sCr 0.60, EGFR greater than 60, nonfasting blood glucose 121, WBC 13.7, hemoglobin 10.4, platelets of 420.  High since he troponin is 23, on repeat was 48.  BNP was 291.1.  Lactic acid was within normal limits at 1.4.  COVID/influenza A/influenza B/RSV PCR were negative.  Blood cultures x 2 have been collected.  ED treatment: BiPAP therapy, azithromycin 500 mg IV one-time  dose, ceftriaxone 2 g IV once, ceftriaxone 500 mg IV one-time dose, albuterol nebulizer one-time dose, LR 1 L bolus.  Assessment and Plan: Acute hypoxic on chronic hypercapnic respiratory failure (HCC) Acute COPD exacerbation. CTA chest showed No evidence of pulmonary embolism. Increased secretions within the trachea and both mainstem bronchi. Similar bronchitic changes in both lower lobes with mucous airways impaction. Irregular nodule in the superior segment of the right lower lobe has slightly decreased in size. New similar-appearing irregular nodule in the more medial superior segment of the right lower lobe, measuring 6 x 7 mm. Findings potentially reflect waxing and waning atypical infection. Aortic Atherosclerosis (ICD10-I70.0) and Emphysema Continue 2L supplemental oxygen to maintain saturation greater than 92%. Continue DuoNebs as needed. Continue triple inhaler therapy.    Completed 5 days of steroids, IV azithromycin and IV cefepime on 07/19/2023. Previously treated with IV lasix. Outpatient follow-up with Dr. Belia Heman, pulmonologist, on 08/09/2023. ABG reviewed, patient did not qualify for Bipap. Continue 2L supplemental O2. Encourage incentive spirometry, out of bed to chair. Stable to be discharged to SNF. Pulmonary and PCP follow up advised.   Anxiety and depression Continue citalopram 30 mg daily.   Elevated troponin Likely due to demand ischemia Patient denies chest pain.  Low clinical suspicion for ACS at this time.   Coronary artery disease involving native coronary artery of native heart without angina pectoris Continue aspirin, home Brilinta and atorvastatin.   Nicotine dependence, cigarettes, uncomplicated Discussed ill effects of tobacco. Counseled smoking cessation.   Benign essential HTN Continue Metoprolol succinate 12.5 mg. Losartan discontinued due  to BP lower side.         Consultants: Pulmonary Procedures performed: none  Disposition: Skilled  nursing facility Diet recommendation:  Discharge Diet Orders (From admission, onward)     Start     Ordered   07/27/23 0000  Diet - low sodium heart healthy        07/27/23 1056           Cardiac diet DISCHARGE MEDICATION: Allergies as of 07/27/2023       Reactions   Levofloxacin Shortness Of Breath   Penicillins Hives   itching   Meloxicam    dizziness   Nsaids Other (See Comments)   Patient states she can tolerate up to three doses per day without incident        Medication List     STOP taking these medications    losartan 25 MG tablet Commonly known as: COZAAR       TAKE these medications    Accu-Chek Guide test strip Generic drug: glucose blood USE AS DIRECTED TO Check blood glucose   Accu-Chek Softclix Lancets lancets as directed.   albuterol (2.5 MG/3ML) 0.083% nebulizer solution Commonly known as: PROVENTIL Take 3 mLs (2.5 mg total) by nebulization 4 (four) times daily.   albuterol 108 (90 Base) MCG/ACT inhaler Commonly known as: Ventolin HFA Inhale 2 puffs into the lungs every 4 (four) hours as needed for wheezing or shortness of breath.   aspirin EC 81 MG tablet TAKE 1 TABLET BY MOUTH EVERY DAY   atorvastatin 40 MG tablet Commonly known as: LIPITOR Take 2 tablets (80 mg total) by mouth daily.   Brilinta 90 MG Tabs tablet Generic drug: ticagrelor TAKE ONE TABLET BY MOUTH EVERY MORNING and TAKE ONE TABLET BY MOUTH EVERYDAY AT BEDTIME   citalopram 20 MG tablet Commonly known as: CELEXA TAKE 1 AND 1/2 TABLETS BY MOUTH EVERY EVENING   cyanocobalamin 1000 MCG tablet Take 1 tablet (1,000 mcg total) by mouth daily.   docusate sodium 100 MG capsule Commonly known as: COLACE Take 1 capsule (100 mg total) by mouth 2 (two) times daily.   ferrous sulfate 325 (65 FE) MG tablet Take 1 tablet (325 mg total) by mouth daily before breakfast. Take on an empty stomach with one cup of juice, preferably orange juice   fluticasone 50 MCG/ACT nasal  spray Commonly known as: FLONASE PLACE TWO SPRAYS into BOTH nostrils DAILY What changed:  how much to take how to take this when to take this reasons to take this   HYDROcodone-acetaminophen 10-325 MG tablet Commonly known as: NORCO Take 1 tablet by mouth 4 (four) times daily as needed.   hydrOXYzine 25 MG tablet Commonly known as: ATARAX Take 25 mg by mouth daily.   ipratropium-albuterol 0.5-2.5 (3) MG/3ML Soln Commonly known as: DUONEB Take 3 mLs by nebulization every 6 (six) hours as needed.   loratadine 10 MG tablet Commonly known as: Allergy Relief Take 1 tablet (10 mg total) by mouth daily.   metoprolol succinate 25 MG 24 hr tablet Commonly known as: TOPROL-XL Take 0.5 tablets (12.5 mg total) by mouth every evening.   Narcan 4 MG/0.1ML Liqd nasal spray kit Generic drug: naloxone 1 spray as needed. As needed in case of overdose   pantoprazole 40 MG tablet Commonly known as: PROTONIX Take 1 tablet (40 mg total) by mouth 2 (two) times daily.   senna-docusate 8.6-50 MG tablet Commonly known as: Senokot-S Take 1 tablet by mouth at bedtime.   Trelegy Ellipta  100-62.5-25 MCG/ACT Aepb Generic drug: Fluticasone-Umeclidin-Vilant Inhale 1 puff into the lungs daily.        Contact information for follow-up providers     Entzminger, Danton Clap, MD Follow up in 1 week(s).   Specialty: Internal Medicine Contact information: [address] [place] [address] [phone]         Erin Fulling, MD Follow up on 08/09/2023.   Specialties: Pulmonary Disease, Cardiology Contact information: [address] [place] [address] [phone]              Contact information for after-discharge care     Destination     [facility] Preferred SNF .   Service: Skilled Nursing Contact information: [address] [place] [address] [phone]                     Discharge Exam: Ceasar Mons Weights   07/15/23 0912  Weight: 55.3 kg      07/27/2023    7:43 AM 07/27/2023    7:16 AM 07/26/2023   10:18 PM  Vitals with BMI  Systolic 130  118  Diastolic 79  81  Pulse 64 68 68    General - Elderly Caucasian female, no apparent distress HEENT - PERRLA, EOMI, atraumatic head, non tender sinuses. Lung - Clear, diffuse wheezes improved. Heart - S1, S2 heard, no murmurs, rubs, trace pedal edema. Abdomen - Soft, non tender, bowel sounds good Neuro - Alert, awake and oriented x 3, non focal exam. Skin - Warm and dry.  Condition at discharge: stable  The results of significant diagnostics from this hospitalization (including imaging, microbiology, ancillary and laboratory) are listed below for reference.   Imaging Studies: CT Angio Chest PE W/Cm &/Or Wo Cm Result Date: 07/15/2023 CLINICAL DATA:  Shortness of breath. EXAM: CT ANGIOGRAPHY CHEST WITH CONTRAST TECHNIQUE: Multidetector CT imaging of the chest was performed using the standard protocol during bolus administration of intravenous contrast. Multiplanar CT image reconstructions and MIPs were obtained to evaluate the vascular anatomy. RADIATION DOSE REDUCTION: This exam was performed according to the departmental dose-optimization program which includes automated exposure control, adjustment of the mA and/or kV according to patient size and/or use of iterative reconstruction technique. CONTRAST:  75mL OMNIPAQUE IOHEXOL 350 MG/ML SOLN COMPARISON:  CT chest dated June 08, 2023. FINDINGS: Cardiovascular: Satisfactory opacification of the pulmonary arteries to the segmental level. No evidence of pulmonary embolism. Unchanged borderline cardiomegaly. No pericardial effusion. 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 Similar mild dilatation of the main and left and right pulmonary arteries. Coronary, aortic arch, and branch  vessel atherosclerotic vascular disease. Mediastinum/Nodes: Unchanged mildly enlarged right hilar lymph node measuring up to 1.2 cm in short axis. No enlarged mediastinal or axillary lymph nodes. Thyroid gland and esophagus demonstrate no significant findings. Lungs/Pleura: Increased secretions within the trachea and both mainstem bronchi. Similar paraseptal and centrilobular emphysema. Irregular nodule in the superior segment of the right lower lobe has slightly decreased in size, currently measuring 6 x 4 mm, previously 8 x 5 mm. New similar-appearing irregular nodule in the more medial superior segment of the right lower lobe, measuring 6 x 7 mm (series 5, image 62). Similar mucous airways impaction in both lower lobe distal bronchi. Improved peripheral subpleural atelectasis in the right lower lobe. Unchanged bronchiectasis and scarring with volume loss in the superior segment of the  left lower lobe. Similar pleuroparenchymal scarring in the remaining left lower lobe and the lingula. No new consolidation, pleural effusion, or pneumothorax. Upper Abdomen: No acute abnormality. Musculoskeletal: No chest wall abnormality. No acute or significant osseous findings. Review of the MIP images confirms the above findings. IMPRESSION: 1. No evidence of pulmonary embolism. 2. Increased secretions within the trachea and both mainstem bronchi. Similar bronchitic changes in both lower lobes with mucous airways impaction. 3. Irregular nodule in the superior segment of the right lower lobe has slightly decreased in size. New similar-appearing irregular nodule in the more medial superior segment of the right lower lobe, measuring 6 x 7 mm. Findings potentially reflect waxing and waning atypical infection. 4. Aortic Atherosclerosis (ICD10-I70.0) and Emphysema (ICD10-J43.9). Electronically Signed   By: Obie Dredge M.D.   On: 07/15/2023 14:37   DG Chest Port 1 View Result Date: 07/15/2023 CLINICAL DATA:  Shortness of breath.  EXAM: PORTABLE CHEST 1 VIEW COMPARISON:  07/02/2023 FINDINGS: The cardiopericardial silhouette is within normal limits for size. Scattered ill-defined parenchymal opacities bilaterally are similar to prior and remain compatible with areas of consolidative airspace disease and/or architectural distortion seen on CT 06/08/2023. No substantial pleural effusion. No pulmonary edema. No acute bony abnormality. Telemetry leads overlie the chest. IMPRESSION: Scattered ill-defined parenchymal opacities bilaterally are similar to prior and remain compatible with areas of consolidative airspace disease and/or architectural distortion seen on CT 06/08/2023. Electronically Signed   By: Kennith Center M.D.   On: 07/15/2023 09:44   ECHOCARDIOGRAM COMPLETE Result Date: 07/02/2023    ECHOCARDIOGRAM REPORT   Patient Name:   LANELL CARPENTER Date of Exam: 07/02/2023 Medical Rec #:  [id number]      Height:       58.0 in Accession #:    [id number]     Weight:       121.0 lb Date of Birth:  [birth year]      BSA:          1.471 m Patient Age:    72 years       BP:           93/64 mmHg Patient Gender: F              HR:           97 bpm. Exam Location:  [facility] Procedure: 2D Echo, Cardiac Doppler and Color Doppler Indications:     CHF-Acute Diastolic I50.31  History:         Patient has prior history of Echocardiogram examinations. Risk                  Factors:Hypertension.  Sonographer:     Neysa Bonito Roar Referring Phys:  [id number] Emeline General Diagnosing Phys: Chilton Si MD  Sonographer Comments: Technically difficult study due to poor echo windows and echo performed with patient supine and on artificial respirator. IMPRESSIONS  1. Left ventricular ejection fraction, by estimation, is 60 to 65%. The left ventricle has normal function. The left ventricle has no regional wall motion abnormalities. Left ventricular diastolic parameters are consistent with Grade I diastolic dysfunction (impaired relaxation).  2. Right ventricular systolic  function is normal. The right ventricular size is normal.  3. The mitral valve is normal in structure. No evidence of mitral valve regurgitation. No evidence of mitral stenosis.  4. The aortic valve is normal in structure. Aortic valve regurgitation is not visualized. No aortic stenosis is present. FINDINGS  Left Ventricle: Left ventricular ejection fraction, by estimation,  is 60 to 65%. The left ventricle has normal function. The left ventricle has no regional wall motion abnormalities. The left ventricular internal cavity size was normal in size. There is  no left ventricular hypertrophy. Left ventricular diastolic parameters are consistent with Grade I diastolic dysfunction (impaired relaxation). Right Ventricle: The right ventricular size is normal. No increase in right ventricular wall thickness. Right ventricular systolic function is normal. Left Atrium: Left atrial size was normal in size. Right Atrium: Right atrial size was normal in size. Pericardium: There is no evidence of pericardial effusion. Mitral Valve: The mitral valve is normal in structure. No evidence of mitral valve regurgitation. No evidence of mitral valve stenosis. MV peak gradient, 6.7 mmHg. The mean mitral valve gradient is 2.0 mmHg. Tricuspid Valve: The tricuspid valve is normal in structure. Tricuspid valve regurgitation is not demonstrated. No evidence of tricuspid stenosis. Aortic Valve: The aortic valve is normal in structure. Aortic valve regurgitation is not visualized. No aortic stenosis is present. Aortic valve mean gradient measures 5.0 mmHg. Aortic valve peak gradient measures 8.8 mmHg. Aortic valve area, by VTI measures 2.86 cm. Pulmonic Valve: The pulmonic valve was normal in structure. Pulmonic valve regurgitation is not visualized. No evidence of pulmonic stenosis. Aorta: The aortic root is normal in size and structure. Venous: The inferior vena cava was not well visualized. IAS/Shunts: No atrial level shunt detected by  color flow Doppler.  LEFT VENTRICLE PLAX 2D LVIDd:         3.50 cm   Diastology LVIDs:         2.40 cm   LV e' medial:    9.25 cm/s LV PW:         0.80 cm   LV E/e' medial:  6.5 LV IVS:        0.90 cm   LV e' lateral:   7.51 cm/s LVOT diam:     1.70 cm   LV E/e' lateral: 8.0 LV SV:         65 LV SV Index:   44 LVOT Area:     2.27 cm  RIGHT VENTRICLE RV Basal diam:  3.00 cm RV Mid diam:    2.50 cm RV S prime:     16.90 cm/s TAPSE (M-mode): 1.9 cm LEFT ATRIUM             Index        RIGHT ATRIUM          Index LA diam:        3.20 cm 2.18 cm/m   RA Area:     9.79 cm LA Vol (A2C):   41.1 ml 27.94 ml/m  RA Volume:   18.60 ml 12.64 ml/m LA Vol (A4C):   28.1 ml 19.10 ml/m LA Biplane Vol: 37.2 ml 25.29 ml/m  AORTIC VALVE                     PULMONIC VALVE AV Area (Vmax):    2.13 cm      PV Vmax:        1.41 m/s AV Area (Vmean):   1.82 cm      PV Peak grad:   8.0 mmHg AV Area (VTI):     2.86 cm      RVOT Peak grad: 4 mmHg AV Vmax:           148.00 cm/s AV Vmean:          103.000 cm/s AV VTI:  0.227 m AV Peak Grad:      8.8 mmHg AV Mean Grad:      5.0 mmHg LVOT Vmax:         139.00 cm/s LVOT Vmean:        82.600 cm/s LVOT VTI:          0.286 m LVOT/AV VTI ratio: 1.26  AORTA Ao Root diam: 2.70 cm Ao Asc diam:  2.80 cm MITRAL VALVE MV Area (PHT): 5.93 cm     SHUNTS MV Area VTI:   3.06 cm     Systemic VTI:  0.29 m MV Peak grad:  6.7 mmHg     Systemic Diam: 1.70 cm MV Mean grad:  2.0 mmHg MV Vmax:       1.29 m/s MV Vmean:      69.2 cm/s MV Decel Time: 128 msec MV E velocity: 60.00 cm/s MV A velocity: 102.00 cm/s MV E/A ratio:  0.59 MV A Prime:    13.3 cm/s Chilton Si MD Electronically signed by Chilton Si MD Signature Date/Time: 07/02/2023/1:38:47 PM    Final    DG Chest Portable 1 View Result Date: 07/02/2023 CLINICAL DATA:  Shortness of breath. EXAM: PORTABLE CHEST 1 VIEW COMPARISON:  06/16/2023 FINDINGS: Cardiopericardial silhouette is at upper limits of normal for size. Interstitial  markings are diffusely coarsened with chronic features. Patchy/nodular ill-defined airspace opacities described previously in the left lung persist. There is some minimal atelectasis at the right base. Probable small bilateral pleural effusions. Bones are diffusely demineralized. Telemetry leads overlie the chest. IMPRESSION: 1. Patchy/nodular ill-defined airspace opacities described previously in the left lung persist. 2. Probable small bilateral pleural effusions. Electronically Signed   By: Kennith Center M.D.   On: 07/02/2023 10:42    Microbiology: Results for orders placed or performed during the hospital encounter of 07/15/23  Resp panel by RT-PCR (RSV, Flu A&B, Covid) Anterior Nasal Swab     Status: None   Collection Time: 07/15/23  9:20 AM   Specimen: Anterior Nasal Swab  Result Value Ref Range Status   SARS Coronavirus 2 by RT PCR NEGATIVE NEGATIVE Final    Comment: (NOTE) SARS-CoV-2 target nucleic acids are NOT DETECTED.  The SARS-CoV-2 RNA is generally detectable in upper respiratory specimens during the acute phase of infection. The lowest concentration of SARS-CoV-2 viral copies this assay can detect is 138 copies/mL. A negative result does not preclude SARS-Cov-2 infection and should not be used as the sole basis for treatment or other patient management decisions. A negative result may occur with  improper specimen collection/handling, submission of specimen other than nasopharyngeal swab, presence of viral mutation(s) within the areas targeted by this assay, and inadequate number of viral copies(<138 copies/mL). A negative result must be combined with clinical observations, patient history, and epidemiological information. The expected result is Negative.  Fact Sheet for Patients:  BloggerCourse.com  Fact Sheet for Healthcare Providers:  SeriousBroker.it  This test is no t yet approved or cleared by the [place] FDA  and  has been authorized for detection and/or diagnosis of SARS-CoV-2 by FDA under an Emergency Use Authorization (EUA). This EUA will remain  in effect (meaning this test can be used) for the duration of the COVID-19 declaration under Section 564(b)(1) of the Act, 21 U.S.C.section 360bbb-3(b)(1), unless the authorization is terminated  or revoked sooner.       Influenza A by PCR NEGATIVE NEGATIVE Final   Influenza B by PCR NEGATIVE NEGATIVE Final    Comment: (NOTE)  The Xpert Xpress SARS-CoV-2/FLU/RSV plus assay is intended as an aid in the diagnosis of influenza from Nasopharyngeal swab specimens and should not be used as a sole basis for treatment. Nasal washings and aspirates are unacceptable for Xpert Xpress SARS-CoV-2/FLU/RSV testing.  Fact Sheet for Patients: BloggerCourse.com  Fact Sheet for Healthcare Providers: SeriousBroker.it  This test is not yet approved or cleared by the [place] FDA and has been authorized for detection and/or diagnosis of SARS-CoV-2 by FDA under an Emergency Use Authorization (EUA). This EUA will remain in effect (meaning this test can be used) for the duration of the COVID-19 declaration under Section 564(b)(1) of the Act, 21 U.S.C. section 360bbb-3(b)(1), unless the authorization is terminated or revoked.     Resp Syncytial Virus by PCR NEGATIVE NEGATIVE Final    Comment: (NOTE) Fact Sheet for Patients: BloggerCourse.com  Fact Sheet for Healthcare Providers: SeriousBroker.it  This test is not yet approved or cleared by the [place] FDA and has been authorized for detection and/or diagnosis of SARS-CoV-2 by FDA under an Emergency Use Authorization (EUA). This EUA will remain in effect (meaning this test can be used) for the duration of the COVID-19 declaration under Section 564(b)(1) of the Act, 21 U.S.C. section 360bbb-3(b)(1),  unless the authorization is terminated or revoked.  Performed at [facility], [address] [place], [place] [address]   Blood culture (routine x 2)     Status: None   Collection Time: 07/15/23 10:37 AM   Specimen: BLOOD  Result Value Ref Range Status   Specimen Description BLOOD LEFT ANTECUBITAL  Final   Special Requests   Final    BOTTLES DRAWN AEROBIC AND ANAEROBIC Blood Culture results may not be optimal due to an inadequate volume of blood received in culture bottles   Culture   Final    NO GROWTH 5 DAYS Performed at [facility], [address] [place], [place] [address]    Report Status 07/20/2023 FINAL  Final  Blood culture (routine x 2)     Status: None   Collection Time: 07/15/23 10:38 AM   Specimen: BLOOD  Result Value Ref Range Status   Specimen Description BLOOD BLOOD RIGHT WRIST  Final   Special Requests   Final    BOTTLES DRAWN AEROBIC AND ANAEROBIC Blood Culture results may not be optimal due to an inadequate volume of blood received in culture bottles   Culture   Final    NO GROWTH 5 DAYS Performed at [facility], [address] [place], [place] [address]    Report Status 07/20/2023 FINAL  Final  Respiratory (~20 pathogens) panel by PCR     Status: None   Collection Time: 07/15/23  5:39 PM   Specimen: Nasopharyngeal Swab; Respiratory  Result Value Ref Range Status   Adenovirus NOT DETECTED NOT DETECTED Final   Coronavirus 229E NOT DETECTED NOT DETECTED Final    Comment: (NOTE) The Coronavirus on the Respiratory Panel, DOES NOT test for the novel  Coronavirus (2019 nCoV)    Coronavirus HKU1 NOT DETECTED NOT DETECTED Final   Coronavirus NL63 NOT DETECTED NOT DETECTED Final   Coronavirus OC43 NOT DETECTED NOT DETECTED Final   Metapneumovirus NOT DETECTED NOT DETECTED Final   Rhinovirus / Enterovirus NOT DETECTED NOT DETECTED Final   Influenza A NOT DETECTED NOT DETECTED Final   Influenza B NOT DETECTED NOT DETECTED Final    Parainfluenza Virus 1 NOT DETECTED NOT DETECTED Final   Parainfluenza Virus 2 NOT DETECTED NOT  DETECTED Final   Parainfluenza Virus 3 NOT DETECTED NOT DETECTED Final   Parainfluenza Virus 4 NOT DETECTED NOT DETECTED Final   Respiratory Syncytial Virus NOT DETECTED NOT DETECTED Final   Bordetella pertussis NOT DETECTED NOT DETECTED Final   Bordetella Parapertussis NOT DETECTED NOT DETECTED Final   Chlamydophila pneumoniae NOT DETECTED NOT DETECTED Final   Mycoplasma pneumoniae NOT DETECTED NOT DETECTED Final    Comment: Performed at [facility], [address], [place], [place] [address]  MRSA Next Gen by PCR, Nasal     Status: None   Collection Time: 07/15/23  5:39 PM   Specimen: Nasopharyngeal Swab; Nasal Swab  Result Value Ref Range Status   MRSA by PCR Next Gen NOT DETECTED NOT DETECTED Final    Comment: (NOTE) The GeneXpert MRSA Assay (FDA approved for NASAL specimens only), is one component of a comprehensive MRSA colonization surveillance program. It is not intended to diagnose MRSA infection nor to guide or monitor treatment for MRSA infections. Test performance is not FDA approved in patients less than 68 years old. Performed at [facility], [address] [place], [place] [address]     Labs: CBC: No results for input(s): "WBC", "NEUTROABS", "HGB", "HCT", "MCV", "PLT" in the last 168 hours. Basic Metabolic Panel: Recent Labs  Lab 07/24/23 0900  NA 135  K 3.9  CL 98  CO2 30  GLUCOSE 115*  BUN 18  CREATININE 0.67  CALCIUM 8.2*  MG 2.0   Liver Function Tests: No results for input(s): "AST", "ALT", "ALKPHOS", "BILITOT", "PROT", "ALBUMIN" in the last 168 hours. CBG: No results for input(s): "GLUCAP" in the last 168 hours.  Discharge time spent: 35 minutes.  Signed: Marcelino Duster, MD [facility] 07/27/2023

## 2023-07-27 NOTE — Progress Notes (Addendum)
Attempted to call report to [employer] and phone was disconnected during call.  2nd attempt - Terri Burns took report via phone. Questions asked and answered. DNR and script on the D/c packet

## 2023-07-27 NOTE — Progress Notes (Signed)
Mobility Specialist - Progress Note  Post-mobility: HR 74, SPO2 97%   07/27/23 1041  Mobility  Activity Ambulated with assistance in room  Level of Assistance Standby assist, set-up cues, supervision of patient - no hands on  Assistive Device Front wheel walker  Distance Ambulated (ft) 40 ft  Activity Response Tolerated well  Mobility visit 1 Mobility  Mobility Specialist Start Time (ACUTE ONLY) 1006  Mobility Specialist Stop Time (ACUTE ONLY) 1020  Mobility Specialist Time Calculation (min) (ACUTE ONLY) 14 min   Pt sitting EOB upon entry, utilizing 2L. Pt motivated and agreeable to OOB amb within the room this date. Pt STS to RW and stood EOB for ~54min while lightheadedness subsided. Pt amb to the bathroom door X2 MinG-SBA, one standing rest break after first bout of amb d/t fatigue--- no physical assist required. Pt's RPE during the first bout of amb 4/10, progressing to 5/10 during the second bout. Pt returned EOB, left simi fowler with alarm set and needs within reach.   Zetta Bills Mobility Specialist 07/27/23 10:58 AM

## 2023-07-27 NOTE — TOC Progression Note (Signed)
Transition of Care [facility]) - Progression Note    Patient Details  Name: Terri Burns MRN: [id number] Date of Birth: 1950-08-15  Transition of Care [facility]) CM/SW Contact  Marlowe Sax, RN Phone Number: 07/27/2023, 11:00 AM  Clinical Narrative:     [facility] to see if they are going to accept the patient, they are accepting this patient but she will be the last today        Expected Discharge Plan and Services         Expected Discharge Date: 07/27/23                                     Social Determinants of Health (SDOH) Interventions SDOH Screenings   Food Insecurity: Food Insecurity Present (07/15/2023)  Housing: Low Risk  (07/15/2023)  Transportation Needs: No Transportation Needs (07/15/2023)  Utilities: Not At Risk (07/15/2023)  Alcohol Screen: Low Risk  (07/13/2021)  Depression (PHQ2-9): Low Risk  (05/11/2022)  Financial Resource Strain: Medium Risk (07/04/2023)  Physical Activity: Insufficiently Active (07/13/2021)  Social Connections: Socially Isolated (07/15/2023)  Stress: No Stress Concern Present (07/13/2021)  Tobacco Use: High Risk (07/15/2023)    Readmission Risk Interventions     No data to display

## 2023-07-27 NOTE — TOC Progression Note (Signed)
Transition of Care [facility]) - Progression Note    Patient Details  Name: Terri Burns MRN: [id number] Date of Birth: Apr 23, 1951  Transition of Care [facility]) CM/SW Contact  Marlowe Sax, RN Phone Number: 07/27/2023, 11:20 AM  Clinical Narrative:     Dabe called me back and stated they need 7 days supply of medications sent with her, I notified Pharmacy       Expected Discharge Plan and Services         Expected Discharge Date: 07/27/23                                     Social Determinants of Health (SDOH) Interventions SDOH Screenings   Food Insecurity: Food Insecurity Present (07/15/2023)  Housing: Low Risk  (07/15/2023)  Transportation Needs: No Transportation Needs (07/15/2023)  Utilities: Not At Risk (07/15/2023)  Alcohol Screen: Low Risk  (07/13/2021)  Depression (PHQ2-9): Low Risk  (05/11/2022)  Financial Resource Strain: Medium Risk (07/04/2023)  Physical Activity: Insufficiently Active (07/13/2021)  Social Connections: Socially Isolated (07/15/2023)  Stress: No Stress Concern Present (07/13/2021)  Tobacco Use: High Risk (07/15/2023)    Readmission Risk Interventions     No data to display

## 2023-07-27 NOTE — TOC Progression Note (Signed)
Transition of Care [facility]) - Progression Note    Patient Details  Name: Terri Burns MRN: [id number] Date of Birth: 02-09-1951  Transition of Care [facility]) CM/SW Contact  Marlowe Sax, RN Phone Number: 07/27/2023, 11:09 AM  Clinical Narrative:      Patient going [facility] She stated that she will tell her family herself I called EMS to transport       Expected Discharge Plan and Services         Expected Discharge Date: 07/27/23                                     Social Determinants of Health (SDOH) Interventions SDOH Screenings   Food Insecurity: Food Insecurity Present (07/15/2023)  Housing: Low Risk  (07/15/2023)  Transportation Needs: No Transportation Needs (07/15/2023)  Utilities: Not At Risk (07/15/2023)  Alcohol Screen: Low Risk  (07/13/2021)  Depression (PHQ2-9): Low Risk  (05/11/2022)  Financial Resource Strain: Medium Risk (07/04/2023)  Physical Activity: Insufficiently Active (07/13/2021)  Social Connections: Socially Isolated (07/15/2023)  Stress: No Stress Concern Present (07/13/2021)  Tobacco Use: High Risk (07/15/2023)    Readmission Risk Interventions     No data to display

## 2023-07-27 NOTE — Discharge Instructions (Signed)
 Follow up with PCP and pick up medications from pharmacy. Make sure to take all antibiotics. Do not double up on narcotic/pain medication or drink alcohol during this time. Do not drive while taking narcotic medication. Call 911 or return to ER for life threatening issues or other concerns.

## 2023-08-09 ENCOUNTER — Ambulatory Visit: Payer: 59 | Admitting: Internal Medicine

## 2023-08-09 ENCOUNTER — Encounter: Payer: Self-pay | Admitting: Internal Medicine

## 2023-08-09 VITALS — BP 126/76 | HR 79 | Temp 97.6°F | Ht <= 58 in | Wt 121.2 lb

## 2023-08-09 DIAGNOSIS — J441 Chronic obstructive pulmonary disease with (acute) exacerbation: Secondary | ICD-10-CM | POA: Diagnosis not present

## 2023-08-09 DIAGNOSIS — J9611 Chronic respiratory failure with hypoxia: Secondary | ICD-10-CM

## 2023-08-09 MED ORDER — METHYLPREDNISOLONE ACETATE 80 MG/ML IJ SUSP
80.0000 mg | Freq: Once | INTRAMUSCULAR | Status: AC
Start: 2023-08-09 — End: 2023-08-09
  Administered 2023-08-09: 80 mg via INTRAMUSCULAR

## 2023-08-09 MED ORDER — IPRATROPIUM-ALBUTEROL 0.5-2.5 (3) MG/3ML IN SOLN
3.0000 mL | Freq: Once | RESPIRATORY_TRACT | Status: AC
  Administered 2023-08-09: 3 mL via RESPIRATORY_TRACT

## 2023-08-09 MED ORDER — IPRATROPIUM-ALBUTEROL 0.5-2.5 (3) MG/3ML IN SOLN
3.0000 mL | Freq: Once | RESPIRATORY_TRACT | Status: AC
Start: 2023-08-09 — End: 2023-08-09
  Administered 2023-08-09: 3 mL via RESPIRATORY_TRACT

## 2023-08-09 MED ORDER — PREDNISONE 20 MG PO TABS
20.0000 mg | ORAL_TABLET | Freq: Every day | ORAL | 1 refills | Status: DC
Start: 2023-08-09 — End: 2023-10-08

## 2023-08-09 NOTE — Patient Instructions (Signed)
 Please stop smoking!!!!  We will provide nebulizer therapy and shot of steroids today  I have ordered prednisone 20 mg daily for the next 10 days  Please continue to use Trelegy inhaler Rinse mouth after every use  Please continue to use DuoNebs every 6 hours for the next 2 days  Please continue with oxygen therapy as prescribed  Avoid Allergens and Irritants Avoid secondhand smoke Avoid SICK contacts Recommend  Masking  when appropriate Recommend Keep up-to-date with vaccinations

## 2023-08-09 NOTE — Progress Notes (Unsigned)
 [facility] [place] Pulmonary Medicine Consultation      Date: 08/09/2023,   MRN# [id number] Terri Burns 30-Mar-1951     CHIEF COMPLAINT:   Assessment of COPD   HISTORY OF PRESENT ILLNESS   73 year old female with history of severe COPD, hyperlipidemia, depression, anxiety, hypertension, GERD, who has presented to ER from home via EMS for chief concerns of shortness of breath.   Vitals in the ED showed temperature of 97.8, respiration rate of 28, heart rate 61, blood pressure 137/49, SpO2 with MS was reported as 82-84 percent on room air, patient was placed on CPAP therapy and arrived at to the ED with SpO2 of 100%.   ED treatment: BiPAP therapy, azithromycin 500 mg IV one-time dose, ceftriaxone 2 g IV once, ceftriaxone 500 mg IV one-time dose, albuterol nebulizer one-time dose, LR 1 L bolus.   CTA chest showed No evidence of pulmonary embolism. Increased secretions within the trachea and both mainstem bronchi. Similar bronchitic changes in both lower lobes with mucous airways impaction. Irregular nodule in the superior segment of the right lower lobe has slightly decreased in size  Patient was treated for pneumonia and discharged Patient presents today with increased work of breathing increased wheezing shortness of breath Patient was discharged on oxygen therapy Patient will need oxygen therapy indefinitely at this time We will provide a nebulizer treatment and Depo shot in the office Patient has been noncompliant with inhaler therapy and has not taken her nebulizer therapy at home  + exacerbation at this time No evidence of heart failure at this time No evidence or signs of infection at this time No respiratory distress No fevers, chills, nausea, vomiting, diarrhea No evidence of lower extremity edema No evidence hemoptysis  Patient continues to smoke Patient with extensive smoking history  Past Medical History:  Diagnosis Date   COPD (chronic obstructive pulmonary disease)  (HCC)    Depression    GERD (gastroesophageal reflux disease)    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    Hyperlipidemia    Hypertension    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     SURGICAL HISTORY   Past Surgical History:  Procedure Laterality Date   BIOPSY  07/07/2023   Procedure: BIOPSY;  Surgeon: Midge Minium, MD;  Location: [facility] ENDOSCOPY;  Service: Endoscopy;;   COLONOSCOPY WITH PROPOFOL N/A 07/07/2023   Procedure: COLONOSCOPY WITH PROPOFOL;  Surgeon: Midge Minium, MD;  Location: [facility] ENDOSCOPY;  Service: Endoscopy;  Laterality: N/A;   CORONARY STENT INTERVENTION N/A 02/22/2021   Procedure: CORONARY STENT INTERVENTION;  Surgeon: Iran Ouch, MD;  Location: [facility] INVASIVE CV LAB;  Service: Cardiovascular;  Laterality: N/A;   ESOPHAGOGASTRODUODENOSCOPY (EGD) WITH PROPOFOL N/A 07/07/2023   Procedure: ESOPHAGOGASTRODUODENOSCOPY (EGD) WITH PROPOFOL;  Surgeon: Midge Minium, MD;  Location: [facility] ENDOSCOPY;  Service: Endoscopy;  Laterality: N/A;   LEFT HEART CATH AND CORONARY ANGIOGRAPHY N/A 02/22/2021   Procedure: LEFT HEART CATH AND CORONARY ANGIOGRAPHY;  Surgeon: Iran Ouch, MD;  Location: [facility] INVASIVE CV LAB;  Service: Cardiovascular;  Laterality: N/A;   NECK SURGERY N/A 10/2003   POLYPECTOMY  07/07/2023   Procedure: POLYPECTOMY;  Surgeon: Midge Minium, MD;  Location: [facility] ENDOSCOPY;  Service: Endoscopy;;   SHOULDER SURGERY Left 2004     FAMILY HISTORY   Family History  Problem Relation Age of Onset   Hypertension Mother    Stroke Mother    Heart disease Father  Diabetes Father    Heart disease Daughter    Pulmonary embolism Daughter    Hypertension Son    Breast cancer Neg Hx      SOCIAL HISTORY   Social History   Tobacco Use   Smoking status: Every Day    Current packs/day: 0.25    Average packs/day: 0.3 packs/day for 50.1 years (12.5 ttl pk-yrs)    Types: Cigarettes     Start date: 12/10/1973   Smokeless tobacco: Never   Tobacco comments:    1-2 ciggs per day - 04/13/2021; has a hx of 2 PPD x 10 years   Vaping Use   Vaping status: Never Used  Substance Use Topics   Alcohol use: Not Currently   Drug use: No     MEDICATIONS    Home Medication:  Current Outpatient Rx   Order #: [id number] Class: Historical Med   Order #: [id number] Class: Historical Med   Order #: [id number] Class: Normal   Order #: [id number] Class: Normal   Order #: [id number] Class: Normal   Order #: [id number] Class: Normal   Order #: [id number] Class: Normal   Order #: [id number] Class: Normal   Order #: [id number] Class: Normal   Order #: [id number] Class: Normal   Order #: [id number] Class: Normal   Order #: [id number] Class: Normal   Order #: [id number] Class: Print   Order #: [id number] Class: Normal   Order #: [id number] Class: Normal   Order #: [id number] Class: Normal   Order #: [id number] Class: Normal   Order #: [id number] Class: Historical Med   Order #: [id number] Class: Normal   Order #: [id number] Class: Normal   Order #: [id number] Class: Normal    Current Medication:  Current Outpatient Medications:    ACCU-CHEK GUIDE test strip, USE AS DIRECTED TO Check blood glucose, Disp: , Rfl:    Accu-Chek Softclix Lancets lancets, as directed., Disp: , Rfl:    albuterol (PROVENTIL) (2.5 MG/3ML) 0.083% nebulizer solution, Take 3 mLs (2.5 mg total) by nebulization 4 (four) times daily., Disp: 75 mL, Rfl: 12   albuterol (VENTOLIN HFA) 108 (90 Base) MCG/ACT inhaler, Inhale 2 puffs into the lungs every 4 (four) hours as needed for wheezing or shortness of breath., Disp: 18 g, Rfl: 2   aspirin EC 81 MG tablet, Take 1 tablet (81 mg total) by mouth daily. Swallow whole., Disp: 90 tablet, Rfl: 0   atorvastatin (LIPITOR) 40 MG tablet, Take 2 tablets (80 mg total) by mouth daily., Disp: 180 tablet, Rfl: 1   citalopram (CELEXA) 20 MG tablet, Take 1.5 tablets (30 mg total) by mouth daily., Disp: 90 tablet, Rfl: 0    cyanocobalamin 1000 MCG tablet, Take 1 tablet (1,000 mcg total) by mouth daily., Disp: 30 tablet, Rfl: 2   docusate sodium (COLACE) 100 MG capsule, Take 1 capsule (100 mg total) by mouth 2 (two) times daily., Disp: 10 capsule, Rfl: 0   ferrous sulfate 325 (65 FE) MG tablet, Take 1 tablet (325 mg total) by mouth daily before breakfast. Take on an empty stomach with one cup of juice, preferably orange juice, Disp: 30 tablet, Rfl: 2   fluticasone (FLONASE) 50 MCG/ACT nasal spray, Place 2 sprays into both nostrils daily., Disp: 48 g, Rfl: 1   Fluticasone-Umeclidin-Vilant (TRELEGY ELLIPTA) 100-62.5-25 MCG/ACT AEPB, Inhale 1 puff into the lungs daily., Disp: 60 each, Rfl: 2   HYDROcodone-acetaminophen (NORCO) 10-325 MG tablet, Take 1 tablet by mouth 4 (four) times daily as needed., Disp: 30 tablet, Rfl: 0   hydrOXYzine (ATARAX) 25 MG tablet, Take 1 tablet (25 mg total) by mouth daily., Disp:  30 tablet, Rfl: 0   ipratropium-albuterol (DUONEB) 0.5-2.5 (3) MG/3ML SOLN, Take 3 mLs by nebulization every 6 (six) hours as needed., Disp: 360 mL, Rfl: 0   loratadine (ALLERGY RELIEF) 10 MG tablet, Take 1 tablet (10 mg total) by mouth daily., Disp: 90 tablet, Rfl: 1   metoprolol succinate (TOPROL-XL) 25 MG 24 hr tablet, Take 0.5 tablets (12.5 mg total) by mouth every evening., Disp: 90 tablet, Rfl: 1   NARCAN 4 MG/0.1ML LIQD nasal spray kit, 1 spray as needed. As needed in case of overdose, Disp: , Rfl:    pantoprazole (PROTONIX) 40 MG tablet, Take 1 tablet (40 mg total) by mouth 2 (two) times daily., Disp: 90 tablet, Rfl: 1   senna-docusate (SENOKOT-S) 8.6-50 MG tablet, Take 1 tablet by mouth at bedtime., Disp: 30 tablet, Rfl: 0   ticagrelor (BRILINTA) 90 MG TABS tablet, Take 1 tablet (90 mg total) by mouth 2 (two) times daily., Disp: 60 tablet, Rfl: 0    ALLERGIES   Levofloxacin, Penicillins, Meloxicam, and Nsaids   BP 126/76 (BP Location: Left Arm, Patient Position: Sitting, Cuff Size: Normal)   Pulse 79    Temp 97.6 F (36.4 C) (Temporal)   Ht 4\' 10"  (1.473 m)   Wt 121 lb 3.2 oz (55 kg)   SpO2 99% Comment: 2L oxygen  BMI 25.33 kg/m      Review of Systems: Gen:  Denies  fever, sweats, chills weight loss  HEENT: Denies blurred vision, double vision, ear pain, eye pain, hearing loss, nose bleeds, sore throat Cardiac:  No dizziness, chest pain or heaviness, chest tightness,edema, No JVD Resp:   +cough, +sputum production, +shortness of breath,+wheezing, -hemoptysis,  Other:  All other systems negative   Physical Examination:   General Appearance: No distress  EYES PERRLA, EOM intact.   NECK Supple, No JVD Pulmonary: normal breath sounds, + wheezing.  CardiovascularNormal S1,S2.  No m/r/g.   Abdomen: Benign, Soft, non-tender. Neurology UE/LE 5/5 strength, no focal deficits Ext pulses intact, cap refill intact ALL OTHER ROS ARE NEGATIVE     IMAGING    CT Angio Chest PE W/Cm &/Or Wo Cm Result Date: 07/15/2023 CLINICAL DATA:  Shortness of breath. EXAM: CT ANGIOGRAPHY CHEST WITH CONTRAST TECHNIQUE: Multidetector CT imaging of the chest was performed using the standard protocol during bolus administration of intravenous contrast. Multiplanar CT image reconstructions and MIPs were obtained to evaluate the vascular anatomy. RADIATION DOSE REDUCTION: This exam was performed according to the departmental dose-optimization program which includes automated exposure control, adjustment of the mA and/or kV according to patient size and/or use of iterative reconstruction technique. CONTRAST:  75mL OMNIPAQUE IOHEXOL 350 MG/ML SOLN COMPARISON:  CT chest dated June 08, 2023. FINDINGS: Cardiovascular: Satisfactory opacification of the pulmonary arteries to the segmental level. No evidence of pulmonary embolism. Unchanged borderline cardiomegaly. No pericardial effusion. 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 Similar mild  dilatation of the main and left and right pulmonary arteries. Coronary, aortic arch, and branch vessel atherosclerotic vascular disease. Mediastinum/Nodes: Unchanged mildly enlarged right hilar lymph node measuring up to 1.2 cm in short axis. No enlarged mediastinal or axillary lymph nodes. Thyroid gland and esophagus demonstrate no significant findings. Lungs/Pleura: Increased secretions within the trachea and both mainstem bronchi. Similar paraseptal and centrilobular emphysema. Irregular nodule in the superior segment of the right lower lobe has slightly decreased in size, currently measuring 6 x 4 mm, previously 8 x 5 mm. New similar-appearing  irregular nodule in the more medial superior segment of the right lower lobe, measuring 6 x 7 mm (series 5, image 62). Similar mucous airways impaction in both lower lobe distal bronchi. Improved peripheral subpleural atelectasis in the right lower lobe. Unchanged bronchiectasis and scarring with volume loss in the superior segment of the left lower lobe. Similar pleuroparenchymal scarring in the remaining left lower lobe and the lingula. No new consolidation, pleural effusion, or pneumothorax. Upper Abdomen: No acute abnormality. Musculoskeletal: No chest wall abnormality. No acute or significant osseous findings. Review of the MIP images confirms the above findings. IMPRESSION: 1. No evidence of pulmonary embolism. 2. Increased secretions within the trachea and both mainstem bronchi. Similar bronchitic changes in both lower lobes with mucous airways impaction. 3. Irregular nodule in the superior segment of the right lower lobe has slightly decreased in size. New similar-appearing irregular nodule in the more medial superior segment of the right lower lobe, measuring 6 x 7 mm. Findings potentially reflect waxing and waning atypical infection. 4. Aortic Atherosclerosis (ICD10-I70.0) and Emphysema (ICD10-J43.9). Electronically Signed   By: Obie Dredge M.D.   On:  07/15/2023 14:37   DG Chest Port 1 View Result Date: 07/15/2023 CLINICAL DATA:  Shortness of breath. EXAM: PORTABLE CHEST 1 VIEW COMPARISON:  07/02/2023 FINDINGS: The cardiopericardial silhouette is within normal limits for size. Scattered ill-defined parenchymal opacities bilaterally are similar to prior and remain compatible with areas of consolidative airspace disease and/or architectural distortion seen on CT 06/08/2023. No substantial pleural effusion. No pulmonary edema. No acute bony abnormality. Telemetry leads overlie the chest. IMPRESSION: Scattered ill-defined parenchymal opacities bilaterally are similar to prior and remain compatible with areas of consolidative airspace disease and/or architectural distortion seen on CT 06/08/2023. Electronically Signed   By: Kennith Center M.D.   On: 07/15/2023 09:44      ASSESSMENT/PLAN   73 year old female with COPD with recent admission to the hospital for COPD exacerbation due to pneumonia With ongoing symptoms of extensive wheezing and increased work of breathing   Assessment of COPD Based on her symptoms patient has Gold stage D Patient with severe wheezing, slight increased work of breathing We will provide DOUNEB in office and DEPO steroid shot here in office Will provide prednisone 20 mg daily for the next 10 days I have advised patient to take her DuoNebs every 6 hours for the next several days Patient needs to be more compliant with her inhaler therapy with Trelegy She says the Trelegy helps her a lot Avoid Allergens and Irritants Avoid secondhand smoke Avoid SICK contacts Recommend  Masking  when appropriate Recommend Keep up-to-date with vaccinations   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 I also advised patient on behavioral therapy and can provide oral medication therapy in conjunction with the other therapies Follow up next Office visit  for assessment of smoking cessation Smoking cessation counseling advised for >10 minutes   Extensive smoking history/abnormal CT chest I will talk about enrolling in the lung cancer screening program at next office visit Patient went recent CT chest of  the lungs did not show any significant lung masses or nodules There was evidence of some left lower lobe opacity consistent with pneumonia   MEDICATION ADJUSTMENTS/LABS AND TESTS ORDERED: We will provide nebulizer therapy and shot of steroids today I have ordered prednisone 20 mg daily for the next 10 days Please continue to use Trelegy inhaler Rinse mouth after every use Please continue to use DuoNebs every 6 hours for the next 2 days Please continue with oxygen therapy as prescribed Avoid Allergens and Irritants Avoid secondhand smoke Avoid SICK contacts Recommend  Masking  when appropriate Recommend Keep up-to-date with vaccinations   CURRENT MEDICATIONS REVIEWED AT LENGTH WITH PATIENT TODAY   Patient  satisfied with Plan of action and management. All questions answered  Follow up  4 weeks  I spent a total of 43 minutes reviewing chart data, face-to-face evaluation with the patient, counseling and coordination of care as detailed above.     Lucie Leather, M.D.  [facility]  Medical Director [facility] Medical Director [facility] Cardio-Pulmonary Department

## 2023-08-18 ENCOUNTER — Other Ambulatory Visit: Payer: Self-pay

## 2023-09-07 ENCOUNTER — Ambulatory Visit: Admitting: Internal Medicine

## 2023-09-27 ENCOUNTER — Other Ambulatory Visit: Payer: Self-pay | Admitting: Internal Medicine

## 2023-09-27 DIAGNOSIS — Z1231 Encounter for screening mammogram for malignant neoplasm of breast: Secondary | ICD-10-CM

## 2023-09-27 DIAGNOSIS — Z780 Asymptomatic menopausal state: Secondary | ICD-10-CM

## 2023-10-04 ENCOUNTER — Inpatient Hospital Stay
Admission: EM | Admit: 2023-10-04 | Discharge: 2023-10-08 | DRG: 189 | Disposition: A | Discharge status: Home Health | Attending: Family Medicine | Admitting: Family Medicine

## 2023-10-04 ENCOUNTER — Other Ambulatory Visit: Payer: Self-pay

## 2023-10-04 ENCOUNTER — Emergency Department

## 2023-10-04 ENCOUNTER — Encounter: Payer: Self-pay | Admitting: Internal Medicine

## 2023-10-04 DIAGNOSIS — Z881 Allergy status to other antibiotic agents status: Secondary | ICD-10-CM

## 2023-10-04 DIAGNOSIS — J441 Chronic obstructive pulmonary disease with (acute) exacerbation: Principal | ICD-10-CM | POA: Diagnosis present

## 2023-10-04 DIAGNOSIS — D509 Iron deficiency anemia, unspecified: Secondary | ICD-10-CM | POA: Diagnosis present

## 2023-10-04 DIAGNOSIS — J449 Chronic obstructive pulmonary disease, unspecified: Secondary | ICD-10-CM | POA: Diagnosis not present

## 2023-10-04 DIAGNOSIS — I2510 Atherosclerotic heart disease of native coronary artery without angina pectoris: Secondary | ICD-10-CM | POA: Diagnosis present

## 2023-10-04 DIAGNOSIS — Z833 Family history of diabetes mellitus: Secondary | ICD-10-CM

## 2023-10-04 DIAGNOSIS — J9621 Acute and chronic respiratory failure with hypoxia: Secondary | ICD-10-CM | POA: Diagnosis not present

## 2023-10-04 DIAGNOSIS — Z7982 Long term (current) use of aspirin: Secondary | ICD-10-CM

## 2023-10-04 DIAGNOSIS — I110 Hypertensive heart disease with heart failure: Secondary | ICD-10-CM | POA: Diagnosis present

## 2023-10-04 DIAGNOSIS — J9622 Acute and chronic respiratory failure with hypercapnia: Secondary | ICD-10-CM | POA: Diagnosis present

## 2023-10-04 DIAGNOSIS — Z79899 Other long term (current) drug therapy: Secondary | ICD-10-CM

## 2023-10-04 DIAGNOSIS — Z8249 Family history of ischemic heart disease and other diseases of the circulatory system: Secondary | ICD-10-CM

## 2023-10-04 DIAGNOSIS — J471 Bronchiectasis with (acute) exacerbation: Secondary | ICD-10-CM | POA: Diagnosis present

## 2023-10-04 DIAGNOSIS — J9601 Acute respiratory failure with hypoxia: Secondary | ICD-10-CM

## 2023-10-04 DIAGNOSIS — K219 Gastro-esophageal reflux disease without esophagitis: Secondary | ICD-10-CM | POA: Diagnosis present

## 2023-10-04 DIAGNOSIS — Z85828 Personal history of other malignant neoplasm of skin: Secondary | ICD-10-CM

## 2023-10-04 DIAGNOSIS — I5032 Chronic diastolic (congestive) heart failure: Secondary | ICD-10-CM | POA: Diagnosis present

## 2023-10-04 DIAGNOSIS — Z886 Allergy status to analgesic agent status: Secondary | ICD-10-CM

## 2023-10-04 DIAGNOSIS — E785 Hyperlipidemia, unspecified: Secondary | ICD-10-CM | POA: Diagnosis present

## 2023-10-04 DIAGNOSIS — Z7902 Long term (current) use of antithrombotics/antiplatelets: Secondary | ICD-10-CM

## 2023-10-04 DIAGNOSIS — Z955 Presence of coronary angioplasty implant and graft: Secondary | ICD-10-CM

## 2023-10-04 DIAGNOSIS — Z66 Do not resuscitate: Secondary | ICD-10-CM | POA: Diagnosis present

## 2023-10-04 DIAGNOSIS — Z7951 Long term (current) use of inhaled steroids: Secondary | ICD-10-CM

## 2023-10-04 DIAGNOSIS — Z880 Allergy status to penicillin: Secondary | ICD-10-CM

## 2023-10-04 DIAGNOSIS — G8929 Other chronic pain: Secondary | ICD-10-CM | POA: Diagnosis present

## 2023-10-04 DIAGNOSIS — Z823 Family history of stroke: Secondary | ICD-10-CM

## 2023-10-04 DIAGNOSIS — F17210 Nicotine dependence, cigarettes, uncomplicated: Secondary | ICD-10-CM | POA: Diagnosis present

## 2023-10-04 DIAGNOSIS — H6990 Unspecified Eustachian tube disorder, unspecified ear: Secondary | ICD-10-CM | POA: Diagnosis present

## 2023-10-04 DIAGNOSIS — E8729 Other acidosis: Secondary | ICD-10-CM | POA: Diagnosis present

## 2023-10-04 LAB — CBC WITH DIFFERENTIAL/PLATELET
Abs Immature Granulocytes: 0.04 10*3/uL (ref 0.00–0.07)
Basophils Absolute: 0.1 10*3/uL (ref 0.0–0.1)
Basophils Relative: 1 %
Eosinophils Absolute: 0.4 10*3/uL (ref 0.0–0.5)
Eosinophils Relative: 4 %
HCT: 38.1 % (ref 36.0–46.0)
Hemoglobin: 11.8 g/dL — ABNORMAL LOW (ref 12.0–15.0)
Immature Granulocytes: 0 %
Lymphocytes Relative: 36 %
Lymphs Abs: 3.8 10*3/uL (ref 0.7–4.0)
MCH: 28.6 pg (ref 26.0–34.0)
MCHC: 31 g/dL (ref 30.0–36.0)
MCV: 92.5 fL (ref 80.0–100.0)
Monocytes Absolute: 0.9 10*3/uL (ref 0.1–1.0)
Monocytes Relative: 9 %
Neutro Abs: 5.4 10*3/uL (ref 1.7–7.7)
Neutrophils Relative %: 50 %
Platelets: 275 10*3/uL (ref 150–400)
RBC: 4.12 MIL/uL (ref 3.87–5.11)
RDW: 16 % — ABNORMAL HIGH (ref 11.5–15.5)
WBC: 10.7 10*3/uL — ABNORMAL HIGH (ref 4.0–10.5)
nRBC: 0 % (ref 0.0–0.2)

## 2023-10-04 LAB — COMPREHENSIVE METABOLIC PANEL WITH GFR
ALT: 31 U/L (ref 0–44)
AST: 42 U/L — ABNORMAL HIGH (ref 15–41)
Albumin: 4.1 g/dL (ref 3.5–5.0)
Alkaline Phosphatase: 58 U/L (ref 38–126)
Anion gap: 8 (ref 5–15)
BUN: 14 mg/dL (ref 8–23)
CO2: 27 mmol/L (ref 22–32)
Calcium: 8.8 mg/dL — ABNORMAL LOW (ref 8.9–10.3)
Chloride: 103 mmol/L (ref 98–111)
Creatinine, Ser: 0.56 mg/dL (ref 0.44–1.00)
GFR, Estimated: >60 mL/min (ref >60)
Glucose, Bld: 171 mg/dL — ABNORMAL HIGH (ref 70–99)
Potassium: 4 mmol/L (ref 3.5–5.1)
Sodium: 138 mmol/L (ref 135–145)
Total Bilirubin: 0.4 mg/dL (ref 0.0–1.2)
Total Protein: 7 g/dL (ref 6.5–8.1)

## 2023-10-04 LAB — BLOOD GAS, VENOUS
Acid-base deficit: 0.6 mmol/L (ref 0.0–2.0)
Bicarbonate: 28.1 mmol/L — ABNORMAL HIGH (ref 20.0–28.0)
O2 Saturation: 86.4 %
Patient temperature: 37
pCO2, Ven: 64 mmHg — ABNORMAL HIGH (ref 44–60)
pH, Ven: 7.25 (ref 7.25–7.43)
pO2, Ven: 58 mmHg — ABNORMAL HIGH (ref 32–45)

## 2023-10-04 LAB — TROPONIN I (HIGH SENSITIVITY)
Troponin I (High Sensitivity): 5 ng/L (ref <18)
Troponin I (High Sensitivity): 7 ng/L (ref <18)

## 2023-10-04 LAB — C DIFFICILE QUICK SCREEN W PCR REFLEX
C Diff antigen: NEGATIVE
C Diff interpretation: NOT DETECTED
C Diff toxin: NEGATIVE

## 2023-10-04 LAB — RESP PANEL BY RT-PCR (RSV, FLU A&B, COVID)  RVPGX2
Influenza A by PCR: NEGATIVE
Influenza B by PCR: NEGATIVE
Resp Syncytial Virus by PCR: NEGATIVE
SARS Coronavirus 2 by RT PCR: NEGATIVE

## 2023-10-04 LAB — LACTIC ACID, PLASMA: Lactic Acid, Venous: 1.7 mmol/L (ref 0.5–1.9)

## 2023-10-04 LAB — BRAIN NATRIURETIC PEPTIDE: B Natriuretic Peptide: 368.9 pg/mL — ABNORMAL HIGH (ref 0.0–100.0)

## 2023-10-04 MED ORDER — BUDESON-GLYCOPYRROL-FORMOTEROL 160-9-4.8 MCG/ACT IN AERO
2.0000 | INHALATION_SPRAY | Freq: Two times a day (BID) | RESPIRATORY_TRACT | Status: DC
  Administered 2023-10-04 – 2023-10-08 (×9): 2 via RESPIRATORY_TRACT
  Filled 2023-10-04: qty 5.9

## 2023-10-04 MED ORDER — AZITHROMYCIN 500 MG PO TABS
500.0000 mg | ORAL_TABLET | Freq: Every day | ORAL | Status: AC
  Administered 2023-10-05 – 2023-10-06 (×2): 500 mg via ORAL
  Filled 2023-10-04 (×2): qty 1

## 2023-10-04 MED ORDER — ENOXAPARIN SODIUM 40 MG/0.4ML IJ SOSY
40.0000 mg | PREFILLED_SYRINGE | INTRAMUSCULAR | Status: DC
  Administered 2023-10-04 – 2023-10-07 (×4): 40 mg via SUBCUTANEOUS
  Filled 2023-10-04 (×4): qty 0.4

## 2023-10-04 MED ORDER — METOPROLOL SUCCINATE ER 25 MG PO TB24
12.5000 mg | ORAL_TABLET | Freq: Every evening | ORAL | Status: DC
  Administered 2023-10-04 – 2023-10-07 (×4): 12.5 mg via ORAL
  Filled 2023-10-04 (×4): qty 1

## 2023-10-04 MED ORDER — PANTOPRAZOLE SODIUM 40 MG PO TBEC
40.0000 mg | DELAYED_RELEASE_TABLET | Freq: Two times a day (BID) | ORAL | Status: DC
  Administered 2023-10-04 – 2023-10-08 (×9): 40 mg via ORAL
  Filled 2023-10-04 (×9): qty 1

## 2023-10-04 MED ORDER — FLUTICASONE PROPIONATE 50 MCG/ACT NA SUSP
2.0000 | Freq: Every day | NASAL | Status: DC
  Administered 2023-10-04 – 2023-10-08 (×5): 2 via NASAL
  Filled 2023-10-04: qty 16

## 2023-10-04 MED ORDER — GUAIFENESIN ER 600 MG PO TB12
1200.0000 mg | ORAL_TABLET | Freq: Two times a day (BID) | ORAL | Status: DC
  Administered 2023-10-04 (×2): 1200 mg via ORAL
  Filled 2023-10-04 (×3): qty 2

## 2023-10-04 MED ORDER — ATORVASTATIN CALCIUM 20 MG PO TABS
80.0000 mg | ORAL_TABLET | Freq: Every day | ORAL | Status: DC
  Administered 2023-10-04 – 2023-10-08 (×5): 80 mg via ORAL
  Filled 2023-10-04 (×5): qty 4

## 2023-10-04 MED ORDER — HYDROCODONE-ACETAMINOPHEN 10-325 MG PO TABS
1.0000 | ORAL_TABLET | Freq: Four times a day (QID) | ORAL | Status: DC | PRN
  Administered 2023-10-04 – 2023-10-08 (×13): 1 via ORAL
  Filled 2023-10-04 (×13): qty 1

## 2023-10-04 MED ORDER — SODIUM CHLORIDE 0.9 % IV SOLN
500.0000 mg | INTRAVENOUS | Status: AC
  Administered 2023-10-04: 500 mg via INTRAVENOUS
  Filled 2023-10-04: qty 5

## 2023-10-04 MED ORDER — TICAGRELOR 90 MG PO TABS
90.0000 mg | ORAL_TABLET | Freq: Two times a day (BID) | ORAL | Status: DC
  Administered 2023-10-04 – 2023-10-08 (×9): 90 mg via ORAL
  Filled 2023-10-04 (×10): qty 1

## 2023-10-04 MED ORDER — DOCUSATE SODIUM 100 MG PO CAPS
100.0000 mg | ORAL_CAPSULE | Freq: Two times a day (BID) | ORAL | Status: DC
  Administered 2023-10-04 – 2023-10-07 (×4): 100 mg via ORAL
  Filled 2023-10-04 (×7): qty 1

## 2023-10-04 MED ORDER — IPRATROPIUM-ALBUTEROL 0.5-2.5 (3) MG/3ML IN SOLN
3.0000 mL | Freq: Four times a day (QID) | RESPIRATORY_TRACT | Status: DC
  Administered 2023-10-04 – 2023-10-05 (×5): 3 mL via RESPIRATORY_TRACT
  Filled 2023-10-04 (×5): qty 3

## 2023-10-04 MED ORDER — ASPIRIN 81 MG PO TBEC
81.0000 mg | DELAYED_RELEASE_TABLET | Freq: Every day | ORAL | Status: DC
  Administered 2023-10-04 – 2023-10-08 (×5): 81 mg via ORAL
  Filled 2023-10-04 (×5): qty 1

## 2023-10-04 MED ORDER — METHYLPREDNISOLONE SODIUM SUCC 40 MG IJ SOLR
40.0000 mg | Freq: Two times a day (BID) | INTRAMUSCULAR | Status: DC
  Administered 2023-10-04 – 2023-10-08 (×9): 40 mg via INTRAVENOUS
  Filled 2023-10-04 (×9): qty 1

## 2023-10-04 MED ORDER — ALBUTEROL SULFATE (2.5 MG/3ML) 0.083% IN NEBU
2.5000 mg | INHALATION_SOLUTION | Freq: Once | RESPIRATORY_TRACT | Status: AC
  Administered 2023-10-04: 2.5 mg via RESPIRATORY_TRACT
  Filled 2023-10-04: qty 3

## 2023-10-04 MED ORDER — LORATADINE 10 MG PO TABS
10.0000 mg | ORAL_TABLET | Freq: Every day | ORAL | Status: DC
  Administered 2023-10-04 – 2023-10-08 (×5): 10 mg via ORAL
  Filled 2023-10-04 (×5): qty 1

## 2023-10-04 MED ORDER — FUROSEMIDE 10 MG/ML IJ SOLN
40.0000 mg | Freq: Once | INTRAMUSCULAR | Status: AC
  Administered 2023-10-04: 40 mg via INTRAVENOUS
  Filled 2023-10-04: qty 4

## 2023-10-04 MED ORDER — SODIUM CHLORIDE 0.9 % IV BOLUS
500.0000 mL | Freq: Once | INTRAVENOUS | Status: AC
  Administered 2023-10-04: 500 mL via INTRAVENOUS

## 2023-10-04 MED ORDER — HYDROXYZINE HCL 50 MG PO TABS
25.0000 mg | ORAL_TABLET | Freq: Every day | ORAL | Status: DC
  Administered 2023-10-04 – 2023-10-08 (×5): 25 mg via ORAL
  Filled 2023-10-04 (×5): qty 1

## 2023-10-04 MED ORDER — SENNOSIDES-DOCUSATE SODIUM 8.6-50 MG PO TABS
1.0000 | ORAL_TABLET | Freq: Every day | ORAL | Status: DC
  Administered 2023-10-04: 1 via ORAL
  Filled 2023-10-04 (×3): qty 1

## 2023-10-04 MED ORDER — HYDRALAZINE HCL 20 MG/ML IJ SOLN
5.0000 mg | Freq: Four times a day (QID) | INTRAMUSCULAR | Status: DC | PRN
  Administered 2023-10-04 – 2023-10-07 (×3): 5 mg via INTRAVENOUS
  Filled 2023-10-04 (×4): qty 1

## 2023-10-04 MED ORDER — MORPHINE SULFATE (PF) 2 MG/ML IV SOLN
2.0000 mg | INTRAVENOUS | Status: DC | PRN
  Administered 2023-10-04 – 2023-10-07 (×5): 2 mg via INTRAVENOUS
  Filled 2023-10-04 (×6): qty 1

## 2023-10-04 MED ORDER — ALBUTEROL SULFATE (2.5 MG/3ML) 0.083% IN NEBU
2.5000 mg | INHALATION_SOLUTION | RESPIRATORY_TRACT | Status: DC | PRN
  Administered 2023-10-04: 2.5 mg via RESPIRATORY_TRACT
  Filled 2023-10-04: qty 3

## 2023-10-04 MED ORDER — CITALOPRAM HYDROBROMIDE 20 MG PO TABS
30.0000 mg | ORAL_TABLET | Freq: Every day | ORAL | Status: DC
  Administered 2023-10-04 – 2023-10-08 (×5): 30 mg via ORAL
  Filled 2023-10-04 (×5): qty 2

## 2023-10-04 MED ORDER — FERROUS SULFATE 325 (65 FE) MG PO TABS
325.0000 mg | ORAL_TABLET | Freq: Every day | ORAL | Status: DC
  Administered 2023-10-04 – 2023-10-08 (×5): 325 mg via ORAL
  Filled 2023-10-04 (×5): qty 1

## 2023-10-04 NOTE — ED Provider Notes (Signed)
 [facility] Provider Note    Event Date/Time   First MD Initiated Contact with Patient 10/04/23 [phone]     (approximate)   History   Respiratory Distress   HPI  Terri Burns is a 73 y.o. female with history of COPD, hypertension, hyperlipidemia, GERD, depression, and anxiety who presents with shortness of breath, acute onset this evening.  The patient is unable to give much history due to acute respiratory distress.  She is speaking only in short sentences.  She denies chest pain.  Per EMS, the patient had audible wheezing and was hypoxic on their arrival.  She received a DuoNeb, Solu-Medrol , and magnesium  by EMS.  I reviewed the past medical records.  The patient was most recently admitted to the hospitalist service in February with acute on chronic respiratory failure due to COPD exacerbation.   Physical Exam   Triage Vital Signs: ED Triage Vitals  Encounter Vitals Group     BP 10/04/23 0255 (!) 186/98     Systolic BP Percentile --      Diastolic BP Percentile --      Pulse Rate 10/04/23 0255 87     Resp 10/04/23 0255 (!) 28     Temp 10/04/23 0255 98 F (36.7 C)     Temp Source 10/04/23 0255 Oral     SpO2 10/04/23 0249 99 %     Weight 10/04/23 0257 121 lb 4.1 oz (55 kg)     Height 10/04/23 0257 4\' 10"  (1.473 m)     Head Circumference --      Peak Flow --      Pain Score 10/04/23 0256 0     Pain Loc --      Pain Education --      Exclude from Growth Chart --     Most recent vital signs: Vitals:   10/04/23 0255 10/04/23 0400  BP: (!) 186/98 (!) 131/91  Pulse: 87 89  Resp: (!) 28 (!) 23  Temp: 98 F (36.7 C)   SpO2: 93% 100%    General: Alert, uncomfortable appearing. CV:  Good peripheral perfusion.  Resp:  Increased respiratory effort.  Faint wheezing bilaterally.  Somewhat coarse breath sounds and audible secretions. Abd:  No distention.  Other:  No peripheral edema.   ED Results / Procedures / Treatments   Labs (all labs  ordered are listed, but only abnormal results are displayed) Labs Reviewed  BLOOD GAS, VENOUS - Abnormal; Notable for the following components:      Result Value   pCO2, Ven 64 (*)    pO2, Ven 58 (*)    Bicarbonate 28.1 (*)    All other components within normal limits  COMPREHENSIVE METABOLIC PANEL WITH GFR - Abnormal; Notable for the following components:   Glucose, Bld 171 (*)    Calcium  8.8 (*)    AST 42 (*)    All other components within normal limits  CBC WITH DIFFERENTIAL/PLATELET - Abnormal; Notable for the following components:   WBC 10.7 (*)    Hemoglobin 11.8 (*)    RDW 16.0 (*)    All other components within normal limits  BRAIN NATRIURETIC PEPTIDE - Abnormal; Notable for the following components:   B Natriuretic Peptide 368.9 (*)    All other components within normal limits  RESP PANEL BY RT-PCR (RSV, FLU A&B, COVID)  [id number]  LACTIC ACID, PLASMA  TROPONIN I (HIGH SENSITIVITY)  TROPONIN I (HIGH SENSITIVITY)     EKG  ED  ECG REPORT I, Lind Repine, the attending physician, personally viewed and interpreted this ECG.  Date: 10/04/2023 EKG Time: 0255 Rate: 90 Rhythm: normal sinus rhythm QRS Axis: normal Intervals: normal ST/T Wave abnormalities: Nonspecific ST abnormalities Narrative Interpretation: no evidence of acute ischemia    RADIOLOGY  Chest x-ray: I independently viewed and interpreted the images; there is no focal consolidation or edema  PROCEDURES:  Critical Care performed: Yes, see critical care procedure note(s)  .Critical Care  Performed by: Lind Repine, MD Authorized by: Lind Repine, MD   Critical care provider statement:    Critical care time (minutes):  30   Critical care time was exclusive of:  Separately billable procedures and treating other patients   Critical care was necessary to treat or prevent imminent or life-threatening deterioration of the following conditions:  Respiratory failure   Critical care was  time spent personally by me on the following activities:  Development of treatment plan with patient or surrogate, discussions with consultants, evaluation of patient's response to treatment, examination of patient, ordering and review of laboratory studies, ordering and review of radiographic studies, ordering and performing treatments and interventions, pulse oximetry, re-evaluation of patient's condition, review of old charts and obtaining history from patient or surrogate   Care discussed with: admitting provider      MEDICATIONS ORDERED IN ED: Medications  albuterol  (PROVENTIL ) (2.5 MG/3ML) 0.083% nebulizer solution 2.5 mg (2.5 mg Nebulization Given 10/04/23 0308)  albuterol  (PROVENTIL ) (2.5 MG/3ML) 0.083% nebulizer solution 2.5 mg (2.5 mg Nebulization Given 10/04/23 0307)  sodium chloride  0.9 % bolus 500 mL (0 mLs Intravenous Stopped 10/04/23 0400)     IMPRESSION / MDM / ASSESSMENT AND PLAN / ED COURSE  I reviewed the triage vital signs and the nursing notes.  73 year old female with PMH as noted above presents with acute onset of shortness of breath and respiratory distress.  On arrival to the ED the patient is tachypneic to around 30 breaths/min with increased work of breathing, speaking only in short sentences.  Lung exam reveals some wheezing but also somewhat coarse breath sounds and audible secretions.  There is no peripheral edema.  Differential diagnosis includes, but is not limited to, COPD exacerbation, acute bronchitis, pneumonia, influenza, or other viral syndrome, less likely new onset CHF or other cardiac etiology.  Due to the patient's work of breathing we have placed her on BiPAP.  We will obtain chest x-ray, lab workup, continue bronchodilators, and reassess.  Patient's presentation is most consistent with acute presentation with potential threat to life or bodily function.  The patient is on the cardiac monitor to evaluate for evidence of arrhythmia and/or significant heart  rate changes.  ----------------------------------------- 5:21 AM on 10/04/2023 -----------------------------------------  VBG shows mildly elevated CO2 at 64.  BNP is minimally elevated.  CMP shows no acute abnormalities.  CBC is also unremarkable.  Troponin is negative.  The patient is doing much better on BiPAP and her work of breathing has significantly improved.  Chest x-ray shows no acute findings.  Overall presentation is consistent with COPD exacerbation.  I consulted Dr. Vallarie Gauze from the hospitalist service; based on our discussion she agrees to evaluate the patient for admission.    FINAL CLINICAL IMPRESSION(S) / ED DIAGNOSES   Final diagnoses:  COPD exacerbation (HCC)  Acute respiratory failure with hypoxia (HCC)     Rx / DC Orders   ED Discharge Orders     None        Note:  This document was prepared  using Conservation officer, historic buildings and may include unintentional dictation errors.    Lind Repine, MD 10/04/23 [phone]

## 2023-10-04 NOTE — H&P (Signed)
 History and Physical    Terri Burns ION:[id number] DOB: 1950/10/01 DOA: 10/04/2023  PCP: Benuel Brazier, MD (Confirm with patient/family/NH records and if not entered, this has to be entered at [facility] point of entry) Patient coming from: Home  I have personally briefly reviewed patient's old medical records in [facility]  Chief Complaint: Cough wheezing shortness of breath  HPI: Terri Burns is a 73 y.o. female with medical history significant of COPD, chronic HFpEF, chronic hypoxic respiratory failure on 2 L as needed, HTN, anxiety/depression, chronic iron  deficiency anemia secondary to bleeding peptic ulcer, GERD, cigarette smoking, presented with new onset of cough wheezing shortness of breath.  Symptoms started yesterday evening, patient started to have wheezing, productive cough with light-colored sputum production, and shortness of breath, denied any chest pain no fever or chills.  She turned up her home oxygen with no significant improvement.  She still smokes 2 to 3 cigarettes a day.  ED Course: Afebrile, nontachycardic blood pressure 180/90, O2 saturation 93% on BiPAP.  Chest x-ray negative for acute infiltrates, blood work showed BUN 14 creatinine 0.5 VBG 7.2 5/64/58, WBC 10.7, hemoglobin 11.8.  Patient was given DuoNebs x 2 and continue on BiPAP support.  Review of Systems: As per HPI otherwise 14 point review of systems negative.    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    Hyperlipidemia    Hypertension    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    Past Surgical History:  Procedure Laterality Date   BIOPSY  07/07/2023   Procedure: BIOPSY;  Surgeon: Marnee Sink, MD;  Location: [facility] ENDOSCOPY;  Service: Endoscopy;;   COLONOSCOPY  WITH PROPOFOL  N/A 07/07/2023   Procedure: COLONOSCOPY WITH PROPOFOL ;  Surgeon: Marnee Sink, MD;  Location: [facility] ENDOSCOPY;  Service: Endoscopy;  Laterality: N/A;   CORONARY STENT INTERVENTION N/A 02/22/2021   Procedure: CORONARY STENT INTERVENTION;  Surgeon: Wenona Hamilton, MD;  Location: [facility] INVASIVE CV LAB;  Service: Cardiovascular;  Laterality: N/A;   ESOPHAGOGASTRODUODENOSCOPY (EGD) WITH PROPOFOL  N/A 07/07/2023   Procedure: ESOPHAGOGASTRODUODENOSCOPY (EGD) WITH PROPOFOL ;  Surgeon: Marnee Sink, MD;  Location: [facility] ENDOSCOPY;  Service: Endoscopy;  Laterality: N/A;   LEFT HEART CATH AND CORONARY ANGIOGRAPHY N/A 02/22/2021   Procedure: LEFT HEART CATH AND CORONARY ANGIOGRAPHY;  Surgeon: Wenona Hamilton, MD;  Location: [facility] INVASIVE CV LAB;  Service: Cardiovascular;  Laterality: N/A;   NECK SURGERY N/A 10/2003   POLYPECTOMY  07/07/2023   Procedure: POLYPECTOMY;  Surgeon: Marnee Sink, MD;  Location: [facility] ENDOSCOPY;  Service: Endoscopy;;   SHOULDER SURGERY Left 2004     reports that she has been smoking cigarettes. She started smoking about 55 years ago. She has a 110.7 pack-year smoking history. She has never used smokeless tobacco. She reports that she does not currently use alcohol. She reports that she does not use drugs.  Allergies  Allergen Reactions   Levofloxacin Shortness Of Breath   Penicillins Hives    itching   Meloxicam      dizziness   Nsaids Other (See Comments)    Patient states she can tolerate up to three doses per day without incident    Family History  Problem Relation Age of Onset   Hypertension Mother    Stroke Mother  Heart disease Father    Diabetes Father    Heart disease Daughter    Pulmonary embolism Daughter    Hypertension Son    Breast cancer Neg Hx      Prior to Admission medications   Medication Sig Start Date End Date Taking? Authorizing Provider  albuterol  (PROVENTIL ) (2.5 MG/3ML) 0.083% nebulizer solution Take 3 mLs (2.5 mg total) by  nebulization 4 (four) times daily. 01/07/22  Yes Rizwan, Saima, MD  isosorbide  mononitrate (IMDUR ) 30 MG 24 hr tablet Take 15 mg by mouth daily. 08/15/23  Yes [provider]  ACCU-CHEK GUIDE test strip USE AS DIRECTED TO Check blood glucose 03/25/22   [provider]  Accu-Chek Softclix Lancets lancets as directed. 03/25/22   [provider]  albuterol  (VENTOLIN  HFA) 108 (90 Base) MCG/ACT inhaler Inhale 2 puffs into the lungs every 4 (four) hours as needed for wheezing or shortness of breath. 05/20/23   Shalhoub, Merrill Abide, MD  aspirin  EC 81 MG tablet Take 1 tablet (81 mg total) by mouth daily. Swallow whole. 07/27/23   Aisha Hove, MD  atorvastatin  (LIPITOR ) 40 MG tablet Take 2 tablets (80 mg total) by mouth daily. 07/27/23   Aisha Hove, MD  citalopram  (CELEXA ) 20 MG tablet Take 1.5 tablets (30 mg total) by mouth daily. 07/27/23   Aisha Hove, MD  cyanocobalamin  1000 MCG tablet Take 1 tablet (1,000 mcg total) by mouth daily. 07/27/23   Aisha Hove, MD  docusate sodium  (COLACE) 100 MG capsule Take 1 capsule (100 mg total) by mouth 2 (two) times daily. 07/27/23   Aisha Hove, MD  ferrous sulfate  325 (65 FE) MG tablet Take 1 tablet (325 mg total) by mouth daily before breakfast. Take on an empty stomach with one cup of juice, preferably orange juice 07/27/23   Aisha Hove, MD  fluticasone  (FLONASE ) 50 MCG/ACT nasal spray Place 2 sprays into both nostrils daily. 07/27/23   Aisha Hove, MD  Fluticasone -Umeclidin-Vilant (TRELEGY ELLIPTA ) 100-62.5-25 MCG/ACT AEPB Inhale 1 puff into the lungs daily. 07/27/23   Aisha Hove, MD  HYDROcodone -acetaminophen  (NORCO) 10-325 MG tablet Take 1 tablet by mouth 4 (four) times daily as needed. 07/27/23   Aisha Hove, MD  hydrOXYzine  (ATARAX ) 25 MG tablet Take 1 tablet (25 mg total) by mouth daily. 07/27/23   Aisha Hove, MD  ipratropium-albuterol  (DUONEB)  0.5-2.5 (3) MG/3ML SOLN Take 3 mLs by nebulization every 6 (six) hours as needed. 07/27/23   Aisha Hove, MD  loratadine  (ALLERGY RELIEF) 10 MG tablet Take 1 tablet (10 mg total) by mouth daily. 07/27/23   Aisha Hove, MD  metoprolol  succinate (TOPROL -XL) 25 MG 24 hr tablet Take 0.5 tablets (12.5 mg total) by mouth every evening. 07/27/23   Aisha Hove, MD  NARCAN  4 MG/0.1ML LIQD nasal spray kit 1 spray as needed. As needed in case of overdose 04/13/21   [provider]  pantoprazole  (PROTONIX ) 40 MG tablet Take 1 tablet (40 mg total) by mouth 2 (two) times daily. 07/27/23   Aisha Hove, MD  predniSONE  (DELTASONE ) 20 MG tablet Take 1 tablet (20 mg total) by mouth daily with breakfast. 10 days 08/09/23   Cleve Dale, MD  senna-docusate (SENOKOT-S) 8.6-50 MG tablet Take 1 tablet by mouth at bedtime. 07/27/23   Aisha Hove, MD  ticagrelor  (BRILINTA ) 90 MG TABS tablet Take 1 tablet (90 mg total) by mouth 2 (two) times daily. 07/27/23   Aisha Hove, MD    Physical Exam: Vitals:   10/04/23 0255 10/04/23 0257 10/04/23 0400  10/04/23 0700  BP: (!) 186/98  (!) 131/91 (!) 148/85  Pulse: 87  89 71  Resp: (!) 28  (!) 23 13  Temp: 98 F (36.7 C)   98.2 F (36.8 C)  TempSrc: Oral   Oral  SpO2: 93%  100% 100%  Weight:  55 kg    Height:  4\' 10"  (1.473 m)      Constitutional: NAD, calm, comfortable Vitals:   10/04/23 0255 10/04/23 0257 10/04/23 0400 10/04/23 0700  BP: (!) 186/98  (!) 131/91 (!) 148/85  Pulse: 87  89 71  Resp: (!) 28  (!) 23 13  Temp: 98 F (36.7 C)   98.2 F (36.8 C)  TempSrc: Oral   Oral  SpO2: 93%  100% 100%  Weight:  55 kg    Height:  4\' 10"  (1.473 m)     Eyes: PERRL, lids and conjunctivae normal ENMT: Mucous membranes are moist. Posterior pharynx clear of any exudate or lesions.Normal dentition.  Neck: normal, supple, no masses, no thyromegaly Respiratory: Diminished breathing sound bilaterally, scattered  wheezing, no crackles.  Increasing respiratory effort. No accessory muscle use.  Cardiovascular: Regular rate and rhythm, no murmurs / rubs / gallops. No extremity edema. 2+ pedal pulses. No carotid bruits.  Abdomen: no tenderness, no masses palpated. No hepatosplenomegaly. Bowel sounds positive.  Musculoskeletal: no clubbing / cyanosis. No joint deformity upper and lower extremities. Good ROM, no contractures. Normal muscle tone.  Skin: no rashes, lesions, ulcers. No induration Neurologic: CN 2-12 grossly intact. Sensation intact, DTR normal. Strength 5/5 in all 4.  Psychiatric: Normal judgment and insight. Alert and oriented x 3. Normal mood.     Labs on Admission: I have personally reviewed following labs and imaging studies  CBC: Recent Labs  Lab 10/04/23 0303  WBC 10.7*  NEUTROABS 5.4  HGB 11.8*  HCT 38.1  MCV 92.5  PLT 275   Basic Metabolic Panel: Recent Labs  Lab 10/04/23 0303  NA 138  K 4.0  CL 103  CO2 27  GLUCOSE 171*  BUN 14  CREATININE 0.56  CALCIUM  8.8*   GFR: Estimated Creatinine Clearance: 47.3 mL/min (by C-G formula based on SCr of 0.56 mg/dL). Liver Function Tests: Recent Labs  Lab 10/04/23 0303  AST 42*  ALT 31  ALKPHOS 58  BILITOT 0.4  PROT 7.0  ALBUMIN 4.1   No results for input(s): "LIPASE", "AMYLASE" in the last 168 hours. No results for input(s): "AMMONIA" in the last 168 hours. Coagulation Profile: No results for input(s): "INR", "PROTIME" in the last 168 hours. Cardiac Enzymes: No results for input(s): "CKTOTAL", "CKMB", "CKMBINDEX", "TROPONINI" in the last 168 hours. BNP (last 3 results) No results for input(s): "PROBNP" in the last 8760 hours. HbA1C: No results for input(s): "HGBA1C" in the last 72 hours. CBG: No results for input(s): "GLUCAP" in the last 168 hours. Lipid Profile: No results for input(s): "CHOL", "HDL", "LDLCALC", "TRIG", "CHOLHDL", "LDLDIRECT" in the last 72 hours. Thyroid Function Tests: No results for  input(s): "TSH", "T4TOTAL", "FREET4", "T3FREE", "THYROIDAB" in the last 72 hours. Anemia Panel: No results for input(s): "VITAMINB12", "FOLATE", "FERRITIN", "TIBC", "IRON ", "RETICCTPCT" in the last 72 hours. Urine analysis:    Component Value Date/Time   COLORURINE STRAW (A) 02/02/2021 1507   APPEARANCEUR CLEAR (A) 02/02/2021 1507   LABSPEC 1.010 02/02/2021 1507   PHURINE 6.0 02/02/2021 1507   GLUCOSEU NEGATIVE 02/02/2021 1507   HGBUR SMALL (A) 02/02/2021 1507   BILIRUBINUR NEGATIVE 02/02/2021 1507   KETONESUR NEGATIVE 02/02/2021  1507   PROTEINUR NEGATIVE 02/02/2021 1507   NITRITE NEGATIVE 02/02/2021 1507   LEUKOCYTESUR NEGATIVE 02/02/2021 1507    Radiological Exams on Admission: DG Chest Port 1 View Result Date: 10/04/2023 CLINICAL DATA:  Dyspnea EXAM: PORTABLE CHEST 1 VIEW COMPARISON:  07/14/2013 FINDINGS: Stable bibasilar parenchymal scarring. No superimposed focal pulmonary infiltrate. Stable right basilar pleural thickening. No pneumothorax or pleural effusion. Cardiac size within normal limits. Pulmonary vascularity is normal. No acute bone abnormality. IMPRESSION: 1. No active disease. Stable bibasilar parenchymal scarring. Electronically Signed   By: Worthy Heads M.D.   On: 10/04/2023 03:21    EKG: Independently reviewed.  Sinus rhythm, no acute ST changes.  Assessment/Plan Principal Problem:   COPD (chronic obstructive pulmonary disease) (HCC) Active Problems:   Acute exacerbation of bronchiectasis (HCC)   COPD with acute exacerbation (HCC)  (please populate well all problems here in Problem List. (For example, if patient is on BP meds at home and you resume or decide to hold them, it is a problem that needs to be her. Same for CAD, COPD, HLD and so on)  Acute hypoxic and hypercapnic respiratory failure Acute COPD exacerbation Compensated combined respiratory acidosis  - IV Solu-Medrol  - Continue ICS and LABA - DuoNebs and as needed albuterol  - Incentive spirometry  and flutter valve - Short course azithromycin  - Likely BiPAP can be weaned off in the next few hours.  Chronic HFpEF - Euvolemic - BP controlled, continue losartan  metoprolol   Cigarette smoking - Patient education performed at bedside  History of CAD - No acute concern  Chronic iron  deficiency anemia - Denied any dark-colored stool - Hemoglobin improving as compared to 2 months ago - Continue iron  supplement  DVT prophylaxis: Lovenox  Code Status: DNR Family Communication: None at bedside Disposition Plan: Expect less than 2 midnight hospital stay Consults called: None Admission status: Telemetry observation   [place] MD [facility] Pager [phone]  10/04/2023, 9:06 AM

## 2023-10-04 NOTE — ED Triage Notes (Signed)
 Pt arrives via EMS from home for complaints of difficulty breathing - pt hx COPD - per EMS pt had audible wheezing with increased WOB, sats maintained >91% with home O2 of 2L [place]. Pt A/ox4 with labored breathing on arrival.

## 2023-10-04 NOTE — Progress Notes (Signed)
 Approximately 1000--Pt arrived to unit from ER. Upon arrival, VS obtained and assessment completed. All fall precautions in place. Telemetry connected and notified.

## 2023-10-04 NOTE — Evaluation (Signed)
 Occupational Therapy Evaluation Patient Details Name: Terri Burns MRN: [id number] DOB: April 18, 1951 Today's Date: 10/04/2023   History of Present Illness   Terri Burns is a 73 y.o. female with medical history significant of COPD, chronic HFpEF, chronic hypoxic respiratory failure on 2 L as needed, HTN, anxiety/depression, chronic iron  deficiency anemia secondary to bleeding peptic ulcer, GERD, cigarette smoking, presented with new onset of cough wheezing shortness of breath.     Clinical Impressions Patient presenting with decreased Ind in self care,balance, functional mobility/transfers, endurance, and safety awareness. Patient reports being Mod I with use of rollator and living at home alone. Pt has friends and family that assist with transportation. Pt is on 2Ls O2 chronically and currently on 3Ls during this session. Pt reports feeling SOB but O2 saturation is at 100% during session. Pt appears very anxious with mobility efforts today. CGA- Min A for bed mobility and pt tolerates sitting EOB for 1 minutes before reports need to return to supine.  Patient will benefit from acute OT to increase overall independence in the areas of ADLs, functional mobility, and safety awareness in order to safely discharge.     If plan is discharge home, recommend the following:   A lot of help with walking and/or transfers;A lot of help with bathing/dressing/bathroom;Assistance with cooking/housework;Assist for transportation;Help with stairs or ramp for entrance     Functional Status Assessment   Patient has had a recent decline in their functional status and demonstrates the ability to make significant improvements in function in a reasonable and predictable amount of time.     Equipment Recommendations   Other (comment) (defer to next venue of care)      Precautions/Restrictions   Precautions Precautions: Fall     Mobility Bed Mobility Overal bed mobility: Needs Assistance Bed  Mobility: Sit to Supine, Supine to Sit     Supine to sit: Contact guard Sit to supine: Contact guard assist        Transfers                              ADL either performed or assessed with clinical judgement   ADL Overall ADL's : Needs assistance/impaired     Grooming: Wash/dry hands;Wash/dry face;Set up;Sitting                                       Vision Patient Visual Report: No change from baseline              Pertinent Vitals/Pain Pain Assessment Pain Assessment: No/denies pain     Extremity/Trunk Assessment Upper Extremity Assessment Upper Extremity Assessment: Generalized weakness   Lower Extremity Assessment Lower Extremity Assessment: Generalized weakness       Communication Communication Communication: No apparent difficulties   Cognition Arousal: Alert Behavior During Therapy: WFL for tasks assessed/performed Cognition: No apparent impairments                               Following commands: Intact       Cueing  General Comments   Cueing Techniques: Verbal cues              Home Living Family/patient expects to be discharged to:: Private residence Living Arrangements: Alone Available Help at Discharge: Family;Available PRN/intermittently;Friend(s) Type of Home: Apartment Home  Access: Ramped entrance     Home Layout: One level     Bathroom Shower/Tub: Chief Strategy Officer: Standard     Home Equipment: Shower seat;Toilet riser;Rollator (4 wheels)   Additional Comments: has a safety pull in her bathroom that alerts outside to let someone know she needs help      Prior Functioning/Environment Prior Level of Function : Independent/Modified Independent               ADLs Comments: Pt is Ind with ADLs. Family/friends assist with driving. Pt reports use of rollator in home and community.    OT Problem List: Decreased strength;Decreased activity  tolerance;Decreased safety awareness;Impaired balance (sitting and/or standing);Decreased knowledge of use of DME or AE   OT Treatment/Interventions: Self-care/ADL training;Therapeutic exercise;Therapeutic activities;DME and/or AE instruction;Patient/family education;Balance training;Energy conservation      OT Goals(Current goals can be found in the care plan section)   Acute Rehab OT Goals Patient Stated Goal: to feel better OT Goal Formulation: With patient Time For Goal Achievement: 10/18/23 Potential to Achieve Goals: Fair ADL Goals Pt Will Perform Grooming: with modified independence;standing Pt Will Perform Lower Body Dressing: with supervision;sit to/from stand Pt Will Transfer to Toilet: with supervision;ambulating Pt Will Perform Toileting - Clothing Manipulation and hygiene: sit to/from stand;with supervision   OT Frequency:  Min 2X/week       AM-PAC OT "6 Clicks" Daily Activity     Outcome Measure Help from another person eating meals?: None Help from another person taking care of personal grooming?: None Help from another person toileting, which includes using toliet, bedpan, or urinal?: A Little Help from another person bathing (including washing, rinsing, drying)?: A Little Help from another person to put on and taking off regular upper body clothing?: None Help from another person to put on and taking off regular lower body clothing?: A Little 6 Click Score: 21   End of Session Equipment Utilized During Treatment: Oxygen (3Ls) Nurse Communication: Mobility status  Activity Tolerance: Patient tolerated treatment well Patient left: in bed;with call bell/phone within reach;with bed alarm set  OT Visit Diagnosis: Unsteadiness on feet (R26.81);Repeated falls (R29.6);Muscle weakness (generalized) (M62.81)                Time: 1350-1404 OT Time Calculation (min): 14 min Charges:  OT General Charges $OT Visit: 1 Visit OT Evaluation $OT Eval Low Complexity: 1  Low George Kinder, MS, OTR/L , CBIS ascom [phone]  10/04/23, 3:12 PM

## 2023-10-04 NOTE — Progress Notes (Addendum)
 Patient placed on nasal cannula to trial off of bipap.

## 2023-10-05 ENCOUNTER — Ambulatory Visit: Admitting: Internal Medicine

## 2023-10-05 ENCOUNTER — Observation Stay

## 2023-10-05 DIAGNOSIS — Z823 Family history of stroke: Secondary | ICD-10-CM | POA: Diagnosis not present

## 2023-10-05 DIAGNOSIS — I5032 Chronic diastolic (congestive) heart failure: Secondary | ICD-10-CM | POA: Diagnosis present

## 2023-10-05 DIAGNOSIS — J9621 Acute and chronic respiratory failure with hypoxia: Secondary | ICD-10-CM | POA: Diagnosis present

## 2023-10-05 DIAGNOSIS — Z881 Allergy status to other antibiotic agents status: Secondary | ICD-10-CM | POA: Diagnosis not present

## 2023-10-05 DIAGNOSIS — Z833 Family history of diabetes mellitus: Secondary | ICD-10-CM | POA: Diagnosis not present

## 2023-10-05 DIAGNOSIS — J471 Bronchiectasis with (acute) exacerbation: Secondary | ICD-10-CM

## 2023-10-05 DIAGNOSIS — Z7982 Long term (current) use of aspirin: Secondary | ICD-10-CM | POA: Diagnosis not present

## 2023-10-05 DIAGNOSIS — E8729 Other acidosis: Secondary | ICD-10-CM | POA: Diagnosis present

## 2023-10-05 DIAGNOSIS — Z955 Presence of coronary angioplasty implant and graft: Secondary | ICD-10-CM | POA: Diagnosis not present

## 2023-10-05 DIAGNOSIS — I110 Hypertensive heart disease with heart failure: Secondary | ICD-10-CM | POA: Diagnosis present

## 2023-10-05 DIAGNOSIS — Z8249 Family history of ischemic heart disease and other diseases of the circulatory system: Secondary | ICD-10-CM | POA: Diagnosis not present

## 2023-10-05 DIAGNOSIS — Z886 Allergy status to analgesic agent status: Secondary | ICD-10-CM | POA: Diagnosis not present

## 2023-10-05 DIAGNOSIS — Z66 Do not resuscitate: Secondary | ICD-10-CM | POA: Diagnosis present

## 2023-10-05 DIAGNOSIS — J441 Chronic obstructive pulmonary disease with (acute) exacerbation: Secondary | ICD-10-CM | POA: Diagnosis present

## 2023-10-05 DIAGNOSIS — Z7902 Long term (current) use of antithrombotics/antiplatelets: Secondary | ICD-10-CM | POA: Diagnosis not present

## 2023-10-05 DIAGNOSIS — J9622 Acute and chronic respiratory failure with hypercapnia: Secondary | ICD-10-CM | POA: Diagnosis present

## 2023-10-05 DIAGNOSIS — G8929 Other chronic pain: Secondary | ICD-10-CM | POA: Diagnosis present

## 2023-10-05 DIAGNOSIS — Z85828 Personal history of other malignant neoplasm of skin: Secondary | ICD-10-CM | POA: Diagnosis not present

## 2023-10-05 DIAGNOSIS — J449 Chronic obstructive pulmonary disease, unspecified: Secondary | ICD-10-CM | POA: Diagnosis present

## 2023-10-05 DIAGNOSIS — E785 Hyperlipidemia, unspecified: Secondary | ICD-10-CM | POA: Diagnosis present

## 2023-10-05 DIAGNOSIS — D509 Iron deficiency anemia, unspecified: Secondary | ICD-10-CM | POA: Diagnosis present

## 2023-10-05 DIAGNOSIS — K219 Gastro-esophageal reflux disease without esophagitis: Secondary | ICD-10-CM | POA: Diagnosis present

## 2023-10-05 DIAGNOSIS — H6990 Unspecified Eustachian tube disorder, unspecified ear: Secondary | ICD-10-CM | POA: Diagnosis present

## 2023-10-05 DIAGNOSIS — F17210 Nicotine dependence, cigarettes, uncomplicated: Secondary | ICD-10-CM | POA: Diagnosis present

## 2023-10-05 DIAGNOSIS — I2510 Atherosclerotic heart disease of native coronary artery without angina pectoris: Secondary | ICD-10-CM | POA: Diagnosis present

## 2023-10-05 MED ORDER — GUAIFENESIN 100 MG/5ML PO LIQD
10.0000 mL | Freq: Four times a day (QID) | ORAL | Status: DC | PRN
  Administered 2023-10-05 – 2023-10-07 (×4): 10 mL via ORAL
  Filled 2023-10-05 (×5): qty 10

## 2023-10-05 MED ORDER — IPRATROPIUM-ALBUTEROL 0.5-2.5 (3) MG/3ML IN SOLN
3.0000 mL | Freq: Three times a day (TID) | RESPIRATORY_TRACT | Status: DC
  Administered 2023-10-05 – 2023-10-08 (×8): 3 mL via RESPIRATORY_TRACT
  Filled 2023-10-05 (×9): qty 3

## 2023-10-05 MED ORDER — SALINE SPRAY 0.65 % NA SOLN
1.0000 | NASAL | Status: DC | PRN
  Administered 2023-10-07 – 2023-10-08 (×2): 1 via NASAL
  Filled 2023-10-05: qty 44

## 2023-10-05 NOTE — Progress Notes (Signed)
 PROGRESS NOTE    Terri Burns  ZOX:[id number] DOB: [birth year] DOA: 10/04/2023 PCP: Benuel Brazier, MD  Chief Complaint  Patient presents with   Respiratory Distress    Hospital Course:  Terri Burns is a 73 year old female with COPD, chronic heart failure with preserved EF, chronic hypoxic respiratory failure on 2 L as needed at home, hypertension, anxiety, depression, chronic iron  deficiency anemia secondary to bleeding peptic ulcer, GERD, cigarette smoking, who presented with new onset cough, wheeze, and shortness of breath.  Patient reports she turned up her home oxygen with minimal relief.  In the ED she was found to be afebrile, nontachycardic, blood pressure 180/90.  She required BiPAP to maintain O2 sats above 90%.  Chest x-ray was negative for acute infiltrates.  Patient was given DuoNebs.  She was able to quickly wean off of BiPAP.   Subjective: On evaluation this morning patient reports that she is feeling better than yesterday but still has significant airway sounds and is very dyspneic with minimal movement.  She is also endorsing chronic neck and back pain and would like her hydrocodone  at home dosing.  She also reports that her right ear is feeling full and she is having difficulty hearing.   Objective: Vitals:   10/05/23 0736 10/05/23 0841 10/05/23 1019 10/05/23 1408  BP:  (!) 155/94    Pulse:  88    Resp:  18    Temp:  99.3 F (37.4 C)    TempSrc:  Oral    SpO2: 100% 99% 95% 98%  Weight:      Height:        Intake/Output Summary (Last 24 hours) at 10/05/2023 1519 Last data filed at 10/04/2023 1900 Gross per 24 hour  Intake 370 ml  Output 975 ml  Net -605 ml   Filed Weights   10/04/23 0257  Weight: 55 kg    Examination: General exam: Appears calm and uncomfortable Respiratory system: Easily dyspneic when speaking, Rales bilaterally, no wheezing, on 3 L O2. Cardiovascular system: S1 & S2 heard, RRR.  Gastrointestinal system: Abdomen is  nondistended, soft and nontender.  Neuro: Alert and oriented. No focal neurological deficits. Extremities: Symmetric, expected ROM Skin: No rashes, lesions Psychiatry: Demonstrates appropriate judgement and insight. Mood & affect appropriate for situation.   Assessment & Plan:  Principal Problem:   COPD (chronic obstructive pulmonary disease) (HCC) Active Problems:   Acute exacerbation of bronchiectasis (HCC)   COPD with acute exacerbation (HCC)  Acute on chronic hypoxic and hypercapnic respiratory failure COPD exacerbation Compensated combined respiratory acidosis - Initially required BiPAP, has now been weaned to 3 L - Chronically requires 2 L O2 at home - Status post IV Solu-Medrol , continue prednisone  - Continue ICS and LABA - DuoNebs as needed - Encourage incentive spirometry and flutter valve - Continue with azithromycin   Chronic heart failure with preserved EF - Clinically euvolemic on exam - Blood pressure well-controlled - Resume home meds and GDMT as BP tolerates  Tobacco abuse - Counseled on cessation  Chronic iron  deficiency anemia - Hemoglobin stable from 2 months prior - Continue home dose iron  supplement  Chronic neck and back pain - Takes Norco daily, will resume home meds - PT/OT  Eustachian tube dysfunction - Proceed with saline nasal spray, Flonase , antihistamine  DVT prophylaxis: Lovenox    Code Status: Limited: Do not attempt resuscitation (DNR) -DNR-LIMITED -Do Not Intubate/DNI  Disposition:  Inpatient, pending clinical resolution.  PT/OT evals ordered. Suspect patient will require home health at discharge  Consultants:    Procedures:    Antimicrobials:  Anti-infectives (From admission, onward)    Start     Dose/Rate Route Frequency Ordered Stop   10/05/23 0845  azithromycin  (ZITHROMAX ) tablet 500 mg       Placed in "Followed by" Linked Group   500 mg Oral Daily 10/04/23 0843 10/07/23 0959   10/04/23 0845  azithromycin  (ZITHROMAX ) 500  mg in sodium chloride  0.9 % 250 mL IVPB       Placed in "Followed by" Linked Group   500 mg 250 mL/hr over 60 Minutes Intravenous Every 24 hours 10/04/23 0843 10/04/23 1456       Data Reviewed: I have personally reviewed following labs and imaging studies CBC: Recent Labs  Lab 10/04/23 0303  WBC 10.7*  NEUTROABS 5.4  HGB 11.8*  HCT 38.1  MCV 92.5  PLT 275   Basic Metabolic Panel: Recent Labs  Lab 10/04/23 0303  NA 138  K 4.0  CL 103  CO2 27  GLUCOSE 171*  BUN 14  CREATININE 0.56  CALCIUM  8.8*   GFR: Estimated Creatinine Clearance: 47.3 mL/min (by C-G formula based on SCr of 0.56 mg/dL). Liver Function Tests: Recent Labs  Lab 10/04/23 0303  AST 42*  ALT 31  ALKPHOS 58  BILITOT 0.4  PROT 7.0  ALBUMIN 4.1   CBG: No results for input(s): "GLUCAP" in the last 168 hours.  Recent Results (from the past 240 hours)  Resp panel by RT-PCR (RSV, Flu A&B, Covid) Anterior Nasal Swab     Status: None   Collection Time: 10/04/23  2:49 AM   Specimen: Anterior Nasal Swab  Result Value Ref Range Status   SARS Coronavirus 2 by RT PCR NEGATIVE NEGATIVE Final    Comment: (NOTE) SARS-CoV-2 target nucleic acids are NOT DETECTED.  The SARS-CoV-2 RNA is generally detectable in upper respiratory specimens during the acute phase of infection. The lowest concentration of SARS-CoV-2 viral copies this assay can detect is 138 copies/mL. A negative result does not preclude SARS-Cov-2 infection and should not be used as the sole basis for treatment or other patient management decisions. A negative result may occur with  improper specimen collection/handling, submission of specimen other than nasopharyngeal swab, presence of viral mutation(s) within the areas targeted by this assay, and inadequate number of viral copies(<138 copies/mL). A negative result must be combined with clinical observations, patient history, and epidemiological information. The expected result is  Negative.  Fact Sheet for Patients:  BloggerCourse.com  Fact Sheet for Healthcare Providers:  SeriousBroker.it  This test is no t yet approved or cleared by the [place]  FDA and  has been authorized for detection and/or diagnosis of SARS-CoV-2 by FDA under an Emergency Use Authorization (EUA). This EUA will remain  in effect (meaning this test can be used) for the duration of the COVID-19 declaration under Section 564(b)(1) of the Act, 21 U.S.C.section 360bbb-3(b)(1), unless the authorization is terminated  or revoked sooner.       Influenza A by PCR NEGATIVE NEGATIVE Final   Influenza B by PCR NEGATIVE NEGATIVE Final    Comment: (NOTE) The Xpert Xpress SARS-CoV-2/FLU/RSV plus assay is intended as an aid in the diagnosis of influenza from Nasopharyngeal swab specimens and should not be used as a sole basis for treatment. Nasal washings and aspirates are unacceptable for Xpert Xpress SARS-CoV-2/FLU/RSV testing.  Fact Sheet for Patients: BloggerCourse.com  Fact Sheet for Healthcare Providers: SeriousBroker.it  This test is not yet approved or cleared by the [place] FDA and has been authorized for detection and/or diagnosis of SARS-CoV-2 by FDA under an Emergency Use Authorization (EUA). This EUA will remain in effect (meaning this test can be used) for the duration of the COVID-19 declaration under Section 564(b)(1) of the Act, 21 U.S.C. section 360bbb-3(b)(1), unless the authorization is terminated or revoked.     Resp Syncytial Virus by PCR NEGATIVE NEGATIVE Final    Comment: (NOTE) Fact Sheet for Patients: BloggerCourse.com  Fact Sheet for Healthcare Providers: SeriousBroker.it  This test is not yet approved or cleared by the [place]  FDA and has been authorized for detection and/or diagnosis of  SARS-CoV-2 by FDA under an Emergency Use Authorization (EUA). This EUA will remain in effect (meaning this test can be used) for the duration of the COVID-19 declaration under Section 564(b)(1) of the Act, 21 U.S.C. section 360bbb-3(b)(1), unless the authorization is terminated or revoked.  Performed at [facility], [address] [place], [place] [address]   C Difficile Quick Screen w PCR reflex     Status: None   Collection Time: 10/04/23  4:00 PM   Specimen: STOOL  Result Value Ref Range Status   C Diff antigen NEGATIVE NEGATIVE Final   C Diff toxin NEGATIVE NEGATIVE Final   C Diff interpretation No C. difficile detected.  Final    Comment: Performed at [facility], [address], [place], [place] [address]     Radiology Studies: DG Chest 1 View Result Date: 10/05/2023 CLINICAL DATA:  COPD. EXAM: CHEST  1 VIEW COMPARISON:  10/04/2023 1 and 07/02/2023 FINDINGS: Chronic linear densities in the lower lungs bilaterally. These findings have not significantly changed. No new airspace disease or consolidation. Heart size is stable. Atherosclerotic calcifications at the aortic arch. IMPRESSION: Chronic linear densities in the lower lungs. No acute cardiopulmonary disease. Electronically Signed   By: Elene Griffes M.D.   On: 10/05/2023 09:35   DG Chest Port 1 View Result Date: 10/04/2023 CLINICAL DATA:  Dyspnea EXAM: PORTABLE CHEST 1 VIEW COMPARISON:  07/14/2013 FINDINGS: Stable bibasilar parenchymal scarring. No superimposed focal pulmonary infiltrate. Stable right basilar pleural thickening. No pneumothorax or pleural effusion. Cardiac size within normal limits. Pulmonary vascularity is normal. No acute bone abnormality. IMPRESSION: 1. No active disease. Stable bibasilar parenchymal scarring. Electronically Signed   By: Worthy Heads M.D.   On: 10/04/2023 03:21    Scheduled Meds:  aspirin  EC  81 mg Oral Daily   atorvastatin   80 mg Oral Daily   azithromycin   500 mg Oral  Daily   budeson-glycopyrrolate -formoterol   2 puff Inhalation BID   citalopram   30 mg Oral Daily   docusate sodium   100 mg Oral BID   enoxaparin  (LOVENOX ) injection  40 mg Subcutaneous Q24H   ferrous sulfate   325 mg Oral QAC breakfast   fluticasone   2 spray Each Nare Daily   hydrOXYzine   25 mg Oral Daily   ipratropium-albuterol   3 mL Nebulization Q6H   loratadine   10 mg Oral Daily   methylPREDNISolone  (SOLU-MEDROL ) injection  40 mg Intravenous Q12H   metoprolol  succinate  12.5 mg Oral QPM   pantoprazole   40 mg Oral BID   senna-docusate  1 tablet Oral QHS   ticagrelor   90 mg Oral BID   Continuous Infusions:   LOS: 0 days  MDM: Patient is high risk for one or more organ failure.  They necessitate ongoing hospitalization for continued IV therapies and subsequent lab monitoring. Total time spent interpreting labs and vitals, reviewing the  medical record, coordinating care amongst consultants and care team members, directly assessing and discussing care with the patient and/or family: 55 min Hiroto Saltzman, DO [facility]  To contact the attending physician between 7A-7P please use Epic Chat. To contact the covering physician during after hours 7P-7A, please review Amion.  10/05/2023, 3:19 PM   *This document has been created with the assistance of dictation software. Please excuse typographical errors. *

## 2023-10-05 NOTE — Hospital Course (Addendum)
 Terri Burns is a 73 year old female with COPD, chronic heart failure with preserved EF, chronic hypoxic respiratory failure on 2 L as needed at home, hypertension, anxiety, depression, chronic iron  deficiency anemia secondary to bleeding peptic ulcer, GERD, cigarette smoking, who presented with new onset cough, wheeze, and shortness of breath. Patient reports she turned up her home oxygen with minimal relief. In the ED she was found to be afebrile, nontachycardic, blood pressure 180/90. She required BiPAP to maintain O2 sats above 90%. Chest x-ray was negative for acute infiltrates. Patient was given DuoNebs. She was able to quickly wean off of BiPAP.  Gradually her oxygen requirement returned to baseline with the assistance of steroids, nebulizer therapies, pulmonary toilet, and one-time IV push of Lasix .  Evaluation of 5/4 patient endorsed feeling back to her baseline and ready for discharge home.  Home health arrangements were made prior to DC.  Acute on chronic hypoxic and hypercapnic respiratory failure COPD exacerbation Compensated combined respiratory acidosis - Initially required BiPAP, has now been weaned to 2L - Continue with ICS and LAMA - Continue with as needed DuoNebs - Encourage ongoing use of incentive spirometer and flutter valve   Chronic heart failure with preserved EF - s/p IV Lasix  push x 1 given concerns of pulmonary congestion, had improvement. - Blood pressure trending upward.  Have restarted lisinopril .  Continue home dose Imdur  and metoprolol  -Titrate GDMT as BP tolerates --Outpatient follow up for further med titration   Tobacco abuse - Counseled on cessation   Chronic iron  deficiency anemia - Hemoglobin stable from 2 months prior - Continue home dose iron  supplement   Chronic neck and back pain - Takes Norco daily, will resume home meds - Continue PT/OT   Eustachian tube dysfunction - Proceed with saline nasal spray, Flonase , antihistamine

## 2023-10-05 NOTE — Care Management Obs Status (Signed)
 MEDICARE OBSERVATION STATUS NOTIFICATION   Patient Details  Name: Terri Burns MRN: [id number] Date of Birth: 01-30-1951   Medicare Observation Status Notification Given:  Rudolph Cost, CMA 10/05/2023, 10:05 AM

## 2023-10-05 NOTE — Evaluation (Signed)
 Physical Therapy Evaluation Patient Details Name: Terri Burns MRN: [id number] DOB: [birth year] Today's Date: 10/05/2023  History of Present Illness  LESLIAN BATTAGLINO is a 73 y.o. female with medical history significant of COPD, chronic HFpEF, chronic hypoxic respiratory failure on 2 L as needed, HTN, anxiety/depression, chronic iron  deficiency anemia secondary to bleeding peptic ulcer, GERD, cigarette smoking, presented with new onset of cough wheezing shortness of breath.  Clinical Impression  Patient received in bed, RN in room giving medications. Patient is agreeable to PT assessment. Notable SOB at rest on supplemental O2. Patient is mod I with bed mobility, requiring increased time due to SOB. Transfers sit to stand and bed to recliner with cga. Bed and gown wet, therefore changed gown and assisted in cleaning her up. Good effort and strength, mainly limited by SOB. O2 sats >95% throughout session. She will continue to benefit from skilled PT to improve activity tolerance.          If plan is discharge home, recommend the following: A little help with bathing/dressing/bathroom;A little help with walking and/or transfers;Help with stairs or ramp for entrance;Assist for transportation   Can travel by private vehicle    yes    Equipment Recommendations None recommended by PT  Recommendations for Other Services       Functional Status Assessment Patient has had a recent decline in their functional status and demonstrates the ability to make significant improvements in function in a reasonable and predictable amount of time.     Precautions / Restrictions Precautions Precautions: Fall Recall of Precautions/Restrictions: Intact Restrictions Weight Bearing Restrictions Per Provider Order: No      Mobility  Bed Mobility Overal bed mobility: Modified Independent Bed Mobility: Supine to Sit     Supine to sit: Modified independent (Device/Increase time), Used rails, HOB elevated      General bed mobility comments: increased time due to SOB    Transfers Overall transfer level: Needs assistance Equipment used: Rolling walker (2 wheels) Transfers: Sit to/from Stand, Bed to chair/wheelchair/BSC Sit to Stand: Contact guard assist   Step pivot transfers: Contact guard assist       General transfer comment: patient with overall good mobility, mostly limited by SOB. O2 sats remained 95% or above, HR up to 102 with transfer    Ambulation/Gait               General Gait Details: unable due to SOB  Stairs            Wheelchair Mobility     Tilt Bed    Modified Rankin (Stroke Patients Only)       Balance Overall balance assessment: Modified Independent                                           Pertinent Vitals/Pain Pain Assessment Pain Assessment: Faces Faces Pain Scale: Hurts little more Pain Location: back Pain Descriptors / Indicators: Discomfort, Grimacing, Moaning Pain Intervention(s): Monitored during session, Repositioned    Home Living Family/patient expects to be discharged to:: Private residence Living Arrangements: Alone Available Help at Discharge: Family;Available PRN/intermittently;Friend(s) Type of Home: Apartment Home Access: Ramped entrance       Home Layout: One level Home Equipment: Shower seat;Toilet riser;Rollator (4 wheels) Additional Comments: has a safety pull in her bathroom that alerts outside to let someone know she needs help    Prior Function  Prior Level of Function : Independent/Modified Independent             Mobility Comments: Has a rollator she uses on occasion for community mobility. ADLs Comments: Pt is Ind with ADLs. Family/friends assist with driving. Pt reports use of rollator in home and community.     Extremity/Trunk Assessment        Lower Extremity Assessment Lower Extremity Assessment: Generalized weakness    Cervical / Trunk Assessment Cervical / Trunk  Assessment: Kyphotic  Communication   Communication Communication: Impaired Factors Affecting Communication: Hearing impaired    Cognition Arousal: Alert Behavior During Therapy: WFL for tasks assessed/performed   PT - Cognitive impairments: No apparent impairments                         Following commands: Intact       Cueing Cueing Techniques: Verbal cues     General Comments      Exercises     Assessment/Plan    PT Assessment Patient needs continued PT services  PT Problem List Decreased activity tolerance;Decreased mobility;Cardiopulmonary status limiting activity       PT Treatment Interventions Gait training;Functional mobility training;Therapeutic activities;Therapeutic exercise;Patient/family education    PT Goals (Current goals can be found in the Care Plan section)  Acute Rehab PT Goals Patient Stated Goal: improve PT Goal Formulation: With patient Time For Goal Achievement: 10/12/23 Potential to Achieve Goals: Good    Frequency Min 2X/week     Co-evaluation               AM-PAC PT "6 Clicks" Mobility  Outcome Measure Help needed turning from your back to your side while in a flat bed without using bedrails?: A Little Help needed moving from lying on your back to sitting on the side of a flat bed without using bedrails?: A Little Help needed moving to and from a bed to a chair (including a wheelchair)?: A Little Help needed standing up from a chair using your arms (e.g., wheelchair or bedside chair)?: A Little Help needed to walk in hospital room?: A Little Help needed climbing 3-5 steps with a railing? : A Lot 6 Click Score: 17    End of Session Equipment Utilized During Treatment: Oxygen Activity Tolerance: Other (comment) (limited by SOB) Patient left: in chair;with call bell/phone within reach;with chair alarm set Nurse Communication: Mobility status PT Visit Diagnosis: Other abnormalities of gait and mobility  (R26.89);Difficulty in walking, not elsewhere classified (R26.2);Pain Pain - part of body:  (back)    Time: 4403-4742 PT Time Calculation (min) (ACUTE ONLY): 33 min   Charges:   PT Evaluation $PT Eval Moderate Complexity: 1 Mod PT Treatments $Therapeutic Activity: 8-22 mins PT General Charges $$ ACUTE PT VISIT: 1 Visit         Jonisha Kindig, PT, GCS 10/05/23,10:26 AM

## 2023-10-05 NOTE — Plan of Care (Signed)

## 2023-10-06 DIAGNOSIS — J471 Bronchiectasis with (acute) exacerbation: Secondary | ICD-10-CM | POA: Diagnosis not present

## 2023-10-06 DIAGNOSIS — J441 Chronic obstructive pulmonary disease with (acute) exacerbation: Secondary | ICD-10-CM | POA: Diagnosis not present

## 2023-10-06 MED ORDER — ISOSORBIDE MONONITRATE ER 30 MG PO TB24
15.0000 mg | ORAL_TABLET | Freq: Every day | ORAL | Status: DC
  Administered 2023-10-06 – 2023-10-08 (×3): 15 mg via ORAL
  Filled 2023-10-06 (×3): qty 1

## 2023-10-06 NOTE — Progress Notes (Signed)
 PT Cancellation Note  Patient Details Name: Terri Burns MRN: [id number] DOB: 01-05-1951   Cancelled Treatment:    Reason Eval/Treat Not Completed: Fatigue/lethargy limiting ability to participate Attempted to see patient, she was sleeping. Will re-attempt at later time/date as appropriate.   Raja Liska 10/06/2023, 3:32 PM

## 2023-10-06 NOTE — Progress Notes (Signed)
 Occupational Therapy Treatment Patient Details Name: Terri Burns MRN: [id number] DOB: 07/28/1950 Today's Date: 10/06/2023   History of present illness Terri Burns is a 73 y.o. female with medical history significant of COPD, chronic HFpEF, chronic hypoxic respiratory failure on 2 L as needed, HTN, anxiety/depression, chronic iron  deficiency anemia secondary to bleeding peptic ulcer, GERD, cigarette smoking, presented with new onset of cough wheezing shortness of breath.   OT comments  Pt is seated in recliner on arrival. Pleasant and agreeable to OT session. She reports chronic back pain from scoliosis. Pt performed STS from recliner to RW, ambulated to [facility] in room to perform toileting tasks all with SBA. With rest break, pt able to ambulate x2 trials to the door and back ~40 feet using RW with SBA on 2L 02. Sp02 96% and higher throughout session with WOB noted. Edu on PLB, ECS, pacing and use of AE/AD for modification of tasks with pt verbalizing understanding and reporting she feels she may be ready to go home tomorrow. Pt returned to recliner with all needs in place and will cont to require skilled acute OT services to maximize her safety and IND to return to PLOF.       If plan is discharge home, recommend the following:  Assistance with cooking/housework;Assist for transportation;Help with stairs or ramp for entrance;A little help with walking and/or transfers;A little help with bathing/dressing/bathroom   Equipment Recommendations  None recommended by OT (has needed DME)    Recommendations for Other Services      Precautions / Restrictions Precautions Precautions: Fall Recall of Precautions/Restrictions: Intact Restrictions Weight Bearing Restrictions Per Provider Order: No       Mobility Bed Mobility               General bed mobility comments: NT up in recliner pre/post session    Transfers Overall transfer level: Needs assistance   Transfers: Sit to/from  Stand Sit to Stand: Supervision           General transfer comment: no assist needed for STS and mobility to [facility] and to door and back in room 2 trials on 2L 02 with sp02 96% and above     Balance Overall balance assessment: Modified Independent                                         ADL either performed or assessed with clinical judgement   ADL Overall ADL's : Needs assistance/impaired                         Toilet Transfer: Supervision/safety;Rolling walker (2 wheels);BSC/3in1   Toileting- Clothing Manipulation and Hygiene: Supervision/safety;Sit to/from stand              Extremity/Trunk Assessment              Vision       Perception     Praxis     Communication Communication Communication: Impaired Factors Affecting Communication: Hearing impaired   Cognition Arousal: Alert Behavior During Therapy: WFL for tasks assessed/performed                                 Following commands: Intact        Cueing   Cueing Techniques: Verbal cues  Exercises Other Exercises Other  Exercises: Edu on ECS, PLB, pacing, and use of AE/AD to modify activities to maximize ease and minimize overexertion.    Shoulder Instructions       General Comments rest breaks for energy conservation purposes and increased WOB with activity, but sp02 stable on 2L    Pertinent Vitals/ Pain       Pain Assessment Pain Assessment: Faces Faces Pain Scale: Hurts a little bit Pain Location: back Pain Descriptors / Indicators: Discomfort, Moaning Pain Intervention(s): Monitored during session, Limited activity within patient's tolerance, Repositioned  Home Living                                          Prior Functioning/Environment              Frequency  Min 2X/week        Progress Toward Goals  OT Goals(current goals can now be found in the care plan section)  Progress towards OT goals: Progressing  toward goals  Acute Rehab OT Goals Patient Stated Goal: improve breathing OT Goal Formulation: With patient Time For Goal Achievement: 10/18/23 Potential to Achieve Goals: Fair  Plan      Co-evaluation                 AM-PAC OT "6 Clicks" Daily Activity     Outcome Measure   Help from another person eating meals?: None Help from another person taking care of personal grooming?: None Help from another person toileting, which includes using toliet, bedpan, or urinal?: None Help from another person bathing (including washing, rinsing, drying)?: A Little Help from another person to put on and taking off regular upper body clothing?: None Help from another person to put on and taking off regular lower body clothing?: A Little 6 Click Score: 22    End of Session Equipment Utilized During Treatment: Oxygen;Rolling walker (2 wheels)  OT Visit Diagnosis: Unsteadiness on feet (R26.81);Repeated falls (R29.6);Muscle weakness (generalized) (M62.81)   Activity Tolerance Patient tolerated treatment well   Patient Left with call bell/phone within reach;in chair;with chair alarm set   Nurse Communication Mobility status        Time: 1191-4782 OT Time Calculation (min): 21 min  Charges: OT General Charges $OT Visit: 1 Visit OT Treatments $Self Care/Home Management : 8-22 mins  Jiovanni Heeter, OTR/L  10/06/23, 1:57 PM   Kekoa Fyock E Kimberlea Schlag 10/06/2023, 1:53 PM

## 2023-10-06 NOTE — Progress Notes (Signed)
 PROGRESS NOTE    Terri Burns  JXB:[id number] DOB: [birth year] DOA: 10/04/2023 PCP: Benuel Brazier, MD  Chief Complaint  Patient presents with   Respiratory Distress    Hospital Course:  Terri Burns is a 73 year old female with COPD, chronic heart failure with preserved EF, chronic hypoxic respiratory failure on 2 L as needed at home, hypertension, anxiety, depression, chronic iron  deficiency anemia secondary to bleeding peptic ulcer, GERD, cigarette smoking, who presented with new onset cough, wheeze, and shortness of breath.  Patient reports she turned up her home oxygen with minimal relief.  In the ED she was found to be afebrile, nontachycardic, blood pressure 180/90.  She required BiPAP to maintain O2 sats above 90%.  Chest x-ray was negative for acute infiltrates.  Patient was given DuoNebs.  She was able to quickly wean off of BiPAP.   Subjective: On evaluation today patient reports that she is feeling better but not quite back to baseline.  She does have significant wheezing on exam.  She is still requiring about 3 L of oxygen.  Still very dyspneic with minimal exertion   Objective: Vitals:   10/05/23 2041 10/06/23 0507 10/06/23 0758 10/06/23 0824  BP: (!) 143/88 (!) 165/92  (!) 167/90  Pulse: 82 72  72  Resp: 16 18  18   Temp: 98.3 F (36.8 C) 97.8 F (36.6 C)  98 F (36.7 C)  TempSrc: Oral     SpO2: 99% 100% 100% 100%  Weight:      Height:        Intake/Output Summary (Last 24 hours) at 10/06/2023 1532 Last data filed at 10/06/2023 0904 Gross per 24 hour  Intake --  Output 1550 ml  Net -1550 ml   Filed Weights   10/04/23 0257  Weight: 55 kg    Examination: General exam: Appears calm and uncomfortable Respiratory system: Easily dyspneic when speaking, diffuse end expiratory wheezing, on 3 L oxygen. Cardiovascular system: S1 & S2 heard, RRR.  Gastrointestinal system: Abdomen is nondistended, soft and nontender.  Neuro: Alert and oriented. No focal  neurological deficits. Extremities: Symmetric, expected ROM, no edema Skin: No rashes, lesions Psychiatry: Demonstrates appropriate judgement and insight. Mood & affect appropriate for situation.   Assessment & Plan:  Principal Problem:   COPD (chronic obstructive pulmonary disease) (HCC) Active Problems:   COPD exacerbation (HCC)   Acute exacerbation of bronchiectasis (HCC)   COPD with acute exacerbation (HCC)  Acute on chronic hypoxic and hypercapnic respiratory failure COPD exacerbation Compensated combined respiratory acidosis - Initially required BiPAP, has now been weaned to 3 L - Chronically requires 2 L at home, will continue to wean oxygen as tolerated - She is still very dyspneic with minimal exertion and with speaking.  Still diffusely wheezing on exam - Continue with prednisone , taper as tolerated - Continue ICS and LABA - Continue as needed DuoNebs - Have encouraged ongoing use of incentive spirometry and flutter valve -- Continue with azithromycin   Chronic heart failure with preserved EF - Clinically euvolemic on exam - Blood pressure well-controlled - Resume home meds and GDMT as BP tolerates  Tobacco abuse - Counseled on cessation  Chronic iron  deficiency anemia - Hemoglobin stable from 2 months prior - Continue home dose iron  supplement  Chronic neck and back pain - Takes Norco daily, will resume home meds - Continue PT/OT  Eustachian tube dysfunction - Proceed with saline nasal spray, Flonase , antihistamine  DVT prophylaxis: Lovenox    Code Status: Limited: Do not attempt resuscitation (  DNR) -DNR-LIMITED -Do Not Intubate/DNI  Disposition: Inpatient pending clinical resolution.  Still very wheezy, requiring supplemental oxygen, dyspneic with minimal exertion.  Home health PT/OT has been ordered.  Hopefully discharge home tomorrow if more stable.  Consultants:    Procedures:    Antimicrobials:  Anti-infectives (From admission, onward)    Start      Dose/Rate Route Frequency Ordered Stop   10/05/23 0845  azithromycin  (ZITHROMAX ) tablet 500 mg       Placed in "Followed by" Linked Group   500 mg Oral Daily 10/04/23 0843 10/06/23 0830   10/04/23 0845  azithromycin  (ZITHROMAX ) 500 mg in sodium chloride  0.9 % 250 mL IVPB       Placed in "Followed by" Linked Group   500 mg 250 mL/hr over 60 Minutes Intravenous Every 24 hours 10/04/23 0843 10/04/23 1456       Data Reviewed: I have personally reviewed following labs and imaging studies CBC: Recent Labs  Lab 10/04/23 0303  WBC 10.7*  NEUTROABS 5.4  HGB 11.8*  HCT 38.1  MCV 92.5  PLT 275   Basic Metabolic Panel: Recent Labs  Lab 10/04/23 0303  NA 138  K 4.0  CL 103  CO2 27  GLUCOSE 171*  BUN 14  CREATININE 0.56  CALCIUM  8.8*   GFR: Estimated Creatinine Clearance: 47.3 mL/min (by C-G formula based on SCr of 0.56 mg/dL). Liver Function Tests: Recent Labs  Lab 10/04/23 0303  AST 42*  ALT 31  ALKPHOS 58  BILITOT 0.4  PROT 7.0  ALBUMIN 4.1   CBG: No results for input(s): "GLUCAP" in the last 168 hours.  Recent Results (from the past 240 hours)  Resp panel by RT-PCR (RSV, Flu A&B, Covid) Anterior Nasal Swab     Status: None   Collection Time: 10/04/23  2:49 AM   Specimen: Anterior Nasal Swab  Result Value Ref Range Status   SARS Coronavirus 2 by RT PCR NEGATIVE NEGATIVE Final    Comment: (NOTE) SARS-CoV-2 target nucleic acids are NOT DETECTED.  The SARS-CoV-2 RNA is generally detectable in upper respiratory specimens during the acute phase of infection. The lowest concentration of SARS-CoV-2 viral copies this assay can detect is 138 copies/mL. A negative result does not preclude SARS-Cov-2 infection and should not be used as the sole basis for treatment or other patient management decisions. A negative result may occur with  improper specimen collection/handling, submission of specimen other than nasopharyngeal swab, presence of viral mutation(s) within  the areas targeted by this assay, and inadequate number of viral copies(<138 copies/mL). A negative result must be combined with clinical observations, patient history, and epidemiological information. The expected result is Negative.  Fact Sheet for Patients:  BloggerCourse.com  Fact Sheet for Healthcare Providers:  SeriousBroker.it  This test is no t yet approved or cleared by the [place]  FDA and  has been authorized for detection and/or diagnosis of SARS-CoV-2 by FDA under an Emergency Use Authorization (EUA). This EUA will remain  in effect (meaning this test can be used) for the duration of the COVID-19 declaration under Section 564(b)(1) of the Act, 21 U.S.C.section 360bbb-3(b)(1), unless the authorization is terminated  or revoked sooner.       Influenza A by PCR NEGATIVE NEGATIVE Final   Influenza B by PCR NEGATIVE NEGATIVE Final    Comment: (NOTE) The Xpert Xpress SARS-CoV-2/FLU/RSV plus assay is intended as an aid in the diagnosis of influenza from Nasopharyngeal swab specimens and should not be used as a sole basis  for treatment. Nasal washings and aspirates are unacceptable for Xpert Xpress SARS-CoV-2/FLU/RSV testing.  Fact Sheet for Patients: BloggerCourse.com  Fact Sheet for Healthcare Providers: SeriousBroker.it  This test is not yet approved or cleared by the [place]  FDA and has been authorized for detection and/or diagnosis of SARS-CoV-2 by FDA under an Emergency Use Authorization (EUA). This EUA will remain in effect (meaning this test can be used) for the duration of the COVID-19 declaration under Section 564(b)(1) of the Act, 21 U.S.C. section 360bbb-3(b)(1), unless the authorization is terminated or revoked.     Resp Syncytial Virus by PCR NEGATIVE NEGATIVE Final    Comment: (NOTE) Fact Sheet for  Patients: BloggerCourse.com  Fact Sheet for Healthcare Providers: SeriousBroker.it  This test is not yet approved or cleared by the [place]  FDA and has been authorized for detection and/or diagnosis of SARS-CoV-2 by FDA under an Emergency Use Authorization (EUA). This EUA will remain in effect (meaning this test can be used) for the duration of the COVID-19 declaration under Section 564(b)(1) of the Act, 21 U.S.C. section 360bbb-3(b)(1), unless the authorization is terminated or revoked.  Performed at [facility], [address] [place], [place] [address]   C Difficile Quick Screen w PCR reflex     Status: None   Collection Time: 10/04/23  4:00 PM   Specimen: STOOL  Result Value Ref Range Status   C Diff antigen NEGATIVE NEGATIVE Final   C Diff toxin NEGATIVE NEGATIVE Final   C Diff interpretation No C. difficile detected.  Final    Comment: Performed at [facility], [address], [place], [place] [address]     Radiology Studies: DG Chest 1 View Result Date: 10/05/2023 CLINICAL DATA:  COPD. EXAM: CHEST  1 VIEW COMPARISON:  10/04/2023 1 and 07/02/2023 FINDINGS: Chronic linear densities in the lower lungs bilaterally. These findings have not significantly changed. No new airspace disease or consolidation. Heart size is stable. Atherosclerotic calcifications at the aortic arch. IMPRESSION: Chronic linear densities in the lower lungs. No acute cardiopulmonary disease. Electronically Signed   By: Elene Griffes M.D.   On: 10/05/2023 09:35    Scheduled Meds:  aspirin  EC  81 mg Oral Daily   atorvastatin   80 mg Oral Daily   budeson-glycopyrrolate -formoterol   2 puff Inhalation BID   citalopram   30 mg Oral Daily   docusate sodium   100 mg Oral BID   enoxaparin  (LOVENOX ) injection  40 mg Subcutaneous Q24H   ferrous sulfate   325 mg Oral QAC breakfast   fluticasone   2 spray Each Nare Daily   hydrOXYzine   25 mg Oral  Daily   ipratropium-albuterol   3 mL Nebulization TID   isosorbide  mononitrate  15 mg Oral Daily   loratadine   10 mg Oral Daily   methylPREDNISolone  (SOLU-MEDROL ) injection  40 mg Intravenous Q12H   metoprolol  succinate  12.5 mg Oral QPM   pantoprazole   40 mg Oral BID   senna-docusate  1 tablet Oral QHS   ticagrelor   90 mg Oral BID   Continuous Infusions:   LOS: 1 day  MDM: Patient is high risk for one or more organ failure.  They necessitate ongoing hospitalization for continued IV therapies and subsequent lab monitoring. Total time spent interpreting labs and vitals, reviewing the medical record, coordinating care amongst consultants and care team members, directly assessing and discussing care with the patient and/or family: 55 min Thalia Turkington, DO [facility]  To contact the attending physician between 7A-7P please use Epic Chat.  To contact the covering physician during after hours 7P-7A, please review Amion.  10/06/2023, 3:32 PM   *This document has been created with the assistance of dictation software. Please excuse typographical errors. *

## 2023-10-06 NOTE — Plan of Care (Signed)

## 2023-10-06 NOTE — TOC Initial Note (Addendum)
 Transition of Care [facility]) - Initial/Assessment Note    Patient Details  Name: Terri Burns MRN: [id number] Date of Birth: May 30, 1951  Transition of Care [facility] CM/SW Contact:    Elsie Halo, RN Phone Number: 10/06/2023, 1:16 PM  Clinical Narrative:                 Patient with a recent hospital admission and discharge in February 2025. The patient transitioned to [employer] for [employer] The patient is currently being treated for COPD and is on 3 L [place]. Therapy is recommending the patient dc to home with home health PT/OT when medically clear.   Patient active with [facility]. TOC sent referral for [facility] PT/OT to [facility] [phone] with [facility] who accepted.  TOC will continue to follow.     Expected Discharge Plan: Home w Home Health Services Barriers to Discharge: Continued Medical Work up   Patient Goals and CMS Choice            Expected Discharge Plan and Services   Discharge Planning Services: CM Consult   Living arrangements for the past 2 months: Single Family Home                                      Prior Living Arrangements/Services Living arrangements for the past 2 months: Single Family Home Lives with:: Adult Children                   Activities of Daily Living   ADL Screening (condition at time of admission) Independently performs ADLs?: Yes (appropriate for developmental age) Is the patient deaf or have difficulty hearing?: No Does the patient have difficulty seeing, even when wearing glasses/contacts?: No Does the patient have difficulty concentrating, remembering, or making decisions?: No  Permission Sought/Granted                  Emotional Assessment           Psych Involvement: No (comment)  Admission diagnosis:  COPD (chronic obstructive pulmonary disease) (HCC) [J44.9] COPD exacerbation (HCC) [J44.1] Acute respiratory failure with hypoxia (HCC) [J96.01] COPD with acute exacerbation (HCC)  [J44.1] Patient Active Problem List   Diagnosis Date Noted   COPD with acute exacerbation (HCC) 10/04/2023   COPD (chronic obstructive pulmonary disease) (HCC) 10/04/2023   Acute hypoxic on chronic hypercapnic respiratory failure (HCC) 07/15/2023   Elevated troponin 07/15/2023   Polyp of ascending colon 07/07/2023   Duodenal ulcer 07/07/2023   Lower GI bleed 07/04/2023   Acute on chronic diastolic CHF (congestive heart failure) (HCC) 07/02/2023   GI bleed 07/02/2023   Right lower lobe pneumonia 06/09/2023   Acute respiratory failure with hypoxia (HCC) 06/09/2023   Coronary artery disease 06/09/2023   Pulmonary nodule 06/09/2023   Acute hypoxic respiratory failure (HCC) 06/09/2023   Community acquired pneumonia of right lower lobe of lung 06/09/2023   CAP (community acquired pneumonia) 06/09/2023   COPD exacerbation (HCC) 06/08/2023   Low vitamin B12 level 05/20/2023   Microcytic anemia 05/20/2023   Iron  deficiency anemia 05/19/2023   Acute exacerbation of bronchiectasis (HCC) 05/17/2023   Pneumonia of right upper lobe due to Streptococcus pneumoniae (HCC) 05/17/2023   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) 01/05/2022   Anxiety and depression 01/05/2022   Dyslipidemia 01/05/2022   Coronary artery disease involving native coronary artery of native heart without angina pectoris 01/05/2022  GERD without esophagitis 01/05/2022   Acute hypoxemic respiratory failure (HCC) 01/05/2022   Centrilobular emphysema (HCC) 07/13/2021   Nicotine  dependence, cigarettes, uncomplicated 04/13/2021   History of non-ST elevation myocardial infarction (NSTEMI)    Depression 09/13/2020   HLD (hyperlipidemia) 09/13/2020   Atherosclerosis of aorta (HCC) 03/04/2018   Coronary artery calcification of native artery 03/04/2018   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 [place] [address] Phone: [phone] Fax: [phone]  [facility] [address] [place] [address] Phone: [phone] Fax: [phone]     Social Drivers of Health (SDOH) Social History: SDOH Screenings   Food Insecurity: Food Insecurity Present (10/04/2023)  Housing: Low Risk  (10/04/2023)  Transportation Needs: No Transportation Needs (10/04/2023)  Utilities: Not At Risk (10/04/2023)  Alcohol Screen: Low Risk  (07/13/2021)  Depression (PHQ2-9): Low Risk  (05/11/2022)  Financial Resource Strain: Medium Risk (07/04/2023)  Physical Activity: Insufficiently Active (07/13/2021)  Social Connections: Socially Isolated (10/04/2023)  Stress: No Stress Concern Present (07/13/2021)  Tobacco Use: High Risk (10/04/2023)   SDOH Interventions:     Readmission Risk Interventions     No data to display

## 2023-10-07 DIAGNOSIS — J441 Chronic obstructive pulmonary disease with (acute) exacerbation: Secondary | ICD-10-CM | POA: Diagnosis not present

## 2023-10-07 DIAGNOSIS — J471 Bronchiectasis with (acute) exacerbation: Secondary | ICD-10-CM | POA: Diagnosis not present

## 2023-10-07 MED ORDER — LISINOPRIL 10 MG PO TABS
10.0000 mg | ORAL_TABLET | Freq: Every day | ORAL | Status: DC
  Administered 2023-10-07: 10 mg via ORAL
  Filled 2023-10-07: qty 1

## 2023-10-07 MED ORDER — FUROSEMIDE 10 MG/ML IJ SOLN
20.0000 mg | Freq: Once | INTRAMUSCULAR | Status: AC
  Administered 2023-10-07: 20 mg via INTRAVENOUS
  Filled 2023-10-07: qty 2

## 2023-10-07 MED ORDER — LISINOPRIL 20 MG PO TABS
20.0000 mg | ORAL_TABLET | Freq: Every day | ORAL | Status: DC
  Administered 2023-10-08: 20 mg via ORAL
  Filled 2023-10-07: qty 1

## 2023-10-07 NOTE — Plan of Care (Signed)
  Problem: Education: Goal: Knowledge of General Education information will improve Description: Including pain rating scale, medication(s)/side effects and non-pharmacologic comfort measures Outcome: Progressing   Problem: Health Behavior/Discharge Planning: Goal: Ability to manage health-related needs will improve Outcome: Progressing   Problem: Clinical Measurements: Goal: Ability to maintain clinical measurements within normal limits will improve Outcome: Progressing Goal: Will remain free from infection Outcome: Progressing Goal: Respiratory complications will improve Outcome: Progressing Goal: Cardiovascular complication will be avoided Outcome: Progressing   Problem: Activity: Goal: Risk for activity intolerance will decrease Outcome: Progressing   Problem: Nutrition: Goal: Adequate nutrition will be maintained Outcome: Progressing   Problem: Coping: Goal: Level of anxiety will decrease Outcome: Progressing   Problem: Elimination: Goal: Will not experience complications related to bowel motility Outcome: Progressing Goal: Will not experience complications related to urinary retention Outcome: Progressing   Problem: Pain Managment: Goal: General experience of comfort will improve and/or be controlled Outcome: Progressing   Problem: Safety: Goal: Ability to remain free from injury will improve Outcome: Progressing   Problem: Skin Integrity: Goal: Risk for impaired skin integrity will decrease Outcome: Progressing   Problem: Education: Goal: Knowledge of disease or condition will improve Outcome: Progressing Goal: Knowledge of the prescribed therapeutic regimen will improve Outcome: Progressing   Problem: Activity: Goal: Ability to tolerate increased activity will improve Outcome: Progressing Goal: Will verbalize the importance of balancing activity with adequate rest periods Outcome: Progressing   Problem: Respiratory: Goal: Ability to maintain a  clear airway will improve Outcome: Progressing Goal: Levels of oxygenation will improve Outcome: Progressing Goal: Ability to maintain adequate ventilation will improve Outcome: Progressing   Problem: Clinical Measurements: Goal: Diagnostic test results will improve Outcome: Not Applicable   Problem: Education: Goal: Individualized Educational Video(s) Outcome: Not Applicable

## 2023-10-07 NOTE — Plan of Care (Signed)
  Problem: Education: Goal: Knowledge of General Education information will improve Description: Including pain rating scale, medication(s)/side effects and non-pharmacologic comfort measures Outcome: Progressing   Problem: Health Behavior/Discharge Planning: Goal: Ability to manage health-related needs will improve Outcome: Progressing   Problem: Clinical Measurements: Goal: Ability to maintain clinical measurements within normal limits will improve Outcome: Progressing   Problem: Health Behavior/Discharge Planning: Goal: Ability to manage health-related needs will improve Outcome: Progressing   Problem: Activity: Goal: Risk for activity intolerance will decrease Outcome: Progressing   Problem: Coping: Goal: Level of anxiety will decrease Outcome: Progressing   Problem: Pain Managment: Goal: General experience of comfort will improve and/or be controlled Outcome: Not Progressing

## 2023-10-07 NOTE — Progress Notes (Signed)
 Mobility Specialist - Progress Note     10/07/23 1140  Therapy Vitals  Pulse Rate 88  BP (!) 169/89  Oxygen Therapy  SpO2 99 %  O2 Device Nasal Cannula  Mobility  Activity Stood at bedside;Ambulated with assistance in hallway  Level of Assistance Standby assist, set-up cues, supervision of patient - no hands on  Assistive Device Front wheel walker  Distance Ambulated (ft) 200 ft  Range of Motion/Exercises Active  Activity Response Tolerated well  Mobility Referral Yes  Mobility visit 1 Mobility  Mobility Specialist Start Time (ACUTE ONLY) 1121  Mobility Specialist Stop Time (ACUTE ONLY) 1142  Mobility Specialist Time Calculation (min) (ACUTE ONLY) 21 min   Pt resting in bed chair on 2L upon entry. Pt STS and ambulates to hallway around NS SBA with RW. Pt endorses pain in back and neck. Pt appears SOB (O2:99) and take x1 standing rest break during ambulation. Pt returned to bed and left with needs in reach.   Jerri Morale Mobility Specialist 10/07/23, 3:38 PM

## 2023-10-07 NOTE — Progress Notes (Signed)
 Physical Therapy Treatment Patient Details Name: Terri Burns MRN: [id number] DOB: [birth year] Today's Date: 10/07/2023   History of Present Illness Terri Burns is a 73 y.o. female with medical history significant of COPD, chronic HFpEF, chronic hypoxic respiratory failure on 2 L as needed, HTN, anxiety/depression, chronic iron  deficiency anemia secondary to bleeding peptic ulcer, GERD, cigarette smoking, presented with new onset of cough wheezing shortness of breath.    PT Comments  Pt tolerated treatment well today with session emphasis on activity tolerance and insight into deficits. Pt able to improve overall assist levels, ambulation distance, dyspnea, and activity tolerance from last session. Today, she is mod I for bed mobility, SBA for transfers with RW, and ambulates 160ft at SBA level with RW. 2.5-3L O2 via [place] utilized today with SpO2 remaining at therapeutic level during exercise and at rest despite audible wheezing throughout.   RPE of 4-6/10 indicating "moderate activity" after ambulation, indicating continued decreased activity tolerance. She verbalizes understanding of education for activity tolerance and pacing at DC. Pt will continue to benefit from skilled acute PT services to address deficits for return to baseline function. Will continue per POC.    If plan is discharge home, recommend the following: A little help with bathing/dressing/bathroom;A little help with walking and/or transfers;Help with stairs or ramp for entrance;Assist for transportation     Equipment Recommendations  None recommended by PT       Precautions / Restrictions Precautions Precautions: Fall Recall of Precautions/Restrictions: Intact Restrictions Weight Bearing Restrictions Per Provider Order: No     Mobility  Bed Mobility               General bed mobility comments: mod I to sit EOB, HOB elevated, use of BUE for support. C/o mild lightheadedness with positional change that subsided  with time.    Transfers   Equipment used: Rolling walker (2 wheels)   Sit to Stand: Supervision           General transfer comment: SBA for safety to stand from EOB with RW and sit in recliner with RW. Verbal cues for safety, sequencing, and hand placement. Demonstrates "good" eccentric lowering with proper hand placement.    Ambulation/Gait Ambulation/Gait assistance: Supervision Gait Distance (Feet): 180 Feet Assistive device: Rolling walker (2 wheels)         General Gait Details: SBA for safety and line management to ambulate in hallway. Demonstrates slowed cadence with decreased step length/foot clearance bilaterally. Audible wheezing noted throughout gait with conversation and at rest. RPE of 4-6/10 indicates "moderate activity." Ambulates on 3L with SpO2 at 97% and HR at 85bpm.     Balance Overall balance assessment: Modified Independent                                          Communication Communication Communication: Impaired Factors Affecting Communication: Hearing impaired  Cognition Arousal: Alert Behavior During Therapy: WFL for tasks assessed/performed                             Following commands: Intact      Cueing Cueing Techniques: Verbal cues  Exercises Other Exercises Other Exercises: Pt educated Re: PT role/POC, DC recommendations, DME needs at DC, energy conservation, insight into deficits, activity pacing, pursed lip breathing. She verbalized understanding.    General Comments General comments (skin  integrity, edema, etc.): SpO2 at 99% upon entry on 2.5L; titrated to 3L for gait as there is no 1/2 setting, maintains at 97% with HR in 80's. Returned to 2.5L at end of session with SpO2 at 100% and HR in 70's.      Pertinent Vitals/Pain Pain Assessment Pain Assessment: 0-10 Pain Score: 7  Pain Location: neck and back Pain Descriptors / Indicators: Discomfort Pain Intervention(s): Monitored during session,  Repositioned     PT Goals (current goals can now be found in the care plan section) Acute Rehab PT Goals Patient Stated Goal: improve PT Goal Formulation: With patient Time For Goal Achievement: 10/12/23 Potential to Achieve Goals: Good Progress towards PT goals: Progressing toward goals    Frequency    Min 2X/week       AM-PAC PT "6 Clicks" Mobility   Outcome Measure  Help needed turning from your back to your side while in a flat bed without using bedrails?: None Help needed moving from lying on your back to sitting on the side of a flat bed without using bedrails?: None Help needed moving to and from a bed to a chair (including a wheelchair)?: A Little Help needed standing up from a chair using your arms (e.g., wheelchair or bedside chair)?: A Little Help needed to walk in hospital room?: A Little Help needed climbing 3-5 steps with a railing? : A Little 6 Click Score: 20    End of Session Equipment Utilized During Treatment: Gait belt;Oxygen Activity Tolerance: Patient tolerated treatment well Patient left: in chair;with call bell/phone within reach;with chair alarm set Nurse Communication: Mobility status PT Visit Diagnosis: Other abnormalities of gait and mobility (R26.89);Difficulty in walking, not elsewhere classified (R26.2);Pain Pain - part of body:  (neck/back)     Time: 1610-9604 PT Time Calculation (min) (ACUTE ONLY): 23 min  Charges:    $Therapeutic Exercise: 23-37 mins PT General Charges $$ ACUTE PT VISIT: 1 Visit                       Branda Cain, PT, DPT 12:14 PM,10/07/23 Physical Therapist - [facility]

## 2023-10-07 NOTE — Progress Notes (Signed)
 PROGRESS NOTE    Terri Burns  ZOX:[id number] DOB: 03-Aug-1950 DOA: 10/04/2023 PCP: Benuel Brazier, MD  Chief Complaint  Patient presents with   Respiratory Distress    Hospital Course:  Terri Burns is a 73 year old female with COPD, chronic heart failure with preserved EF, chronic hypoxic respiratory failure on 2 L as needed at home, hypertension, anxiety, depression, chronic iron  deficiency anemia secondary to bleeding peptic ulcer, GERD, cigarette smoking, who presented with new onset cough, wheeze, and shortness of breath.  Patient reports she turned up her home oxygen with minimal relief.  In the ED she was found to be afebrile, nontachycardic, blood pressure 180/90.  She required BiPAP to maintain O2 sats above 90%.  Chest x-ray was negative for acute infiltrates.  Patient was given DuoNebs.  She was able to quickly wean off of BiPAP.   Subjective: On evaluation today patient still has significant rales bilaterally.  She does report she is feeling somewhat better.  She is very anxious about her blood pressure which she reports is higher than normal.  She reports in the past she was on multiple blood pressure medications but was weaned off of them due to low blood pressure during her last admission.   Objective: Vitals:   10/07/23 0737 10/07/23 1140 10/07/23 1400 10/07/23 1551  BP: (!) 174/106 (!) 169/89  123/72  Pulse: 98 88  82  Resp: 16   16  Temp: 98.2 F (36.8 C)   99 F (37.2 C)  TempSrc:    Oral  SpO2: 100% 99% 98% 99%  Weight:      Height:        Intake/Output Summary (Last 24 hours) at 10/07/2023 1607 Last data filed at 10/06/2023 1900 Gross per 24 hour  Intake 240 ml  Output --  Net 240 ml   Filed Weights   10/04/23 0257  Weight: 55 kg    Examination: General exam: Appears calm and uncomfortable Respiratory system: Easily dyspneic when speaking, for and expiratory phase, Rales and crackles bilaterally.  on 3 L oxygen. Ear: No significant erythema,  tympanic membrane intact, no bulging, no serous fluid. Cardiovascular system: S1 & S2 heard, RRR.  Gastrointestinal system: Abdomen is nondistended, soft and nontender.  Neuro: Alert and oriented. No focal neurological deficits. Extremities: Symmetric, expected ROM, no edema Skin: No rashes, lesions Psychiatry: Demonstrates appropriate judgement and insight. Mood & affect appropriate for situation.   Assessment & Plan:  Principal Problem:   COPD (chronic obstructive pulmonary disease) (HCC) Active Problems:   COPD exacerbation (HCC)   Acute exacerbation of bronchiectasis (HCC)   COPD with acute exacerbation (HCC)  Acute on chronic hypoxic and hypercapnic respiratory failure COPD exacerbation Compensated combined respiratory acidosis - Initially required BiPAP, has now been weaned to 3 L - Chronically requires 2 L at home, will continue to wean oxygen as tolerated - She is still very dyspneic with minimal exertion and with speaking.  Increasing rales and crackles on exam today.  Will proceed with 20 mg IV Lasix  push x 1 given concerns of pulmonary congestion - Continue with prednisone , taper as tolerated - Continue with ICS and LAMA - Continue with as needed DuoNebs - Encourage ongoing use of incentive Strauch and flutter valve  Chronic heart failure with preserved EF - Clinically euvolemic on exam - Pulmonary exam consistent with lung crackles, concern for congestion - 20 mg IV push Lasix  x 1.  Will monitor for output and improvement in breathing. - Blood pressure trending  upward.  Have restarted lisinopril .  Continue home dose Imdur  and metoprolol  -Titrate GDMT as BP tolerates  Tobacco abuse - Counseled on cessation  Chronic iron  deficiency anemia - Hemoglobin stable from 2 months prior - Continue home dose iron  supplement  Chronic neck and back pain - Takes Norco daily, will resume home meds - Continue PT/OT  Eustachian tube dysfunction - Proceed with saline nasal  spray, Flonase , antihistamine  DVT prophylaxis: Lovenox    Code Status: Limited: Do not attempt resuscitation (DNR) -DNR-LIMITED -Do Not Intubate/DNI  Disposition: Inpatient pending clinical resolution.  Home health PT has been ordered.  Anticipate patient will be able to discharge home tomorrow  Consultants:    Procedures:    Antimicrobials:  Anti-infectives (From admission, onward)    Start     Dose/Rate Route Frequency Ordered Stop   10/05/23 0845  azithromycin  (ZITHROMAX ) tablet 500 mg       Placed in "Followed by" Linked Group   500 mg Oral Daily 10/04/23 0843 10/06/23 0830   10/04/23 0845  azithromycin  (ZITHROMAX ) 500 mg in sodium chloride  0.9 % 250 mL IVPB       Placed in "Followed by" Linked Group   500 mg 250 mL/hr over 60 Minutes Intravenous Every 24 hours 10/04/23 0843 10/04/23 1456       Data Reviewed: I have personally reviewed following labs and imaging studies CBC: Recent Labs  Lab 10/04/23 0303  WBC 10.7*  NEUTROABS 5.4  HGB 11.8*  HCT 38.1  MCV 92.5  PLT 275   Basic Metabolic Panel: Recent Labs  Lab 10/04/23 0303  NA 138  K 4.0  CL 103  CO2 27  GLUCOSE 171*  BUN 14  CREATININE 0.56  CALCIUM  8.8*   GFR: Estimated Creatinine Clearance: 47.3 mL/min (by C-G formula based on SCr of 0.56 mg/dL). Liver Function Tests: Recent Labs  Lab 10/04/23 0303  AST 42*  ALT 31  ALKPHOS 58  BILITOT 0.4  PROT 7.0  ALBUMIN 4.1   CBG: No results for input(s): "GLUCAP" in the last 168 hours.  Recent Results (from the past 240 hours)  Resp panel by RT-PCR (RSV, Flu A&B, Covid) Anterior Nasal Swab     Status: None   Collection Time: 10/04/23  2:49 AM   Specimen: Anterior Nasal Swab  Result Value Ref Range Status   SARS Coronavirus 2 by RT PCR NEGATIVE NEGATIVE Final    Comment: (NOTE) SARS-CoV-2 target nucleic acids are NOT DETECTED.  The SARS-CoV-2 RNA is generally detectable in upper respiratory specimens during the acute phase of infection. The  lowest concentration of SARS-CoV-2 viral copies this assay can detect is 138 copies/mL. A negative result does not preclude SARS-Cov-2 infection and should not be used as the sole basis for treatment or other patient management decisions. A negative result may occur with  improper specimen collection/handling, submission of specimen other than nasopharyngeal swab, presence of viral mutation(s) within the areas targeted by this assay, and inadequate number of viral copies(<138 copies/mL). A negative result must be combined with clinical observations, patient history, and epidemiological information. The expected result is Negative.  Fact Sheet for Patients:  BloggerCourse.com  Fact Sheet for Healthcare Providers:  SeriousBroker.it  This test is no t yet approved or cleared by the [place]  FDA and  has been authorized for detection and/or diagnosis of SARS-CoV-2 by FDA under an Emergency Use Authorization (EUA). This EUA will remain  in effect (meaning this test can be used) for the duration of the COVID-19  declaration under Section 564(b)(1) of the Act, 21 U.S.C.section 360bbb-3(b)(1), unless the authorization is terminated  or revoked sooner.       Influenza A by PCR NEGATIVE NEGATIVE Final   Influenza B by PCR NEGATIVE NEGATIVE Final    Comment: (NOTE) The Xpert Xpress SARS-CoV-2/FLU/RSV plus assay is intended as an aid in the diagnosis of influenza from Nasopharyngeal swab specimens and should not be used as a sole basis for treatment. Nasal washings and aspirates are unacceptable for Xpert Xpress SARS-CoV-2/FLU/RSV testing.  Fact Sheet for Patients: BloggerCourse.com  Fact Sheet for Healthcare Providers: SeriousBroker.it  This test is not yet approved or cleared by the [place]  FDA and has been authorized for detection and/or diagnosis of SARS-CoV-2 by FDA under  an Emergency Use Authorization (EUA). This EUA will remain in effect (meaning this test can be used) for the duration of the COVID-19 declaration under Section 564(b)(1) of the Act, 21 U.S.C. section 360bbb-3(b)(1), unless the authorization is terminated or revoked.     Resp Syncytial Virus by PCR NEGATIVE NEGATIVE Final    Comment: (NOTE) Fact Sheet for Patients: BloggerCourse.com  Fact Sheet for Healthcare Providers: SeriousBroker.it  This test is not yet approved or cleared by the [place]  FDA and has been authorized for detection and/or diagnosis of SARS-CoV-2 by FDA under an Emergency Use Authorization (EUA). This EUA will remain in effect (meaning this test can be used) for the duration of the COVID-19 declaration under Section 564(b)(1) of the Act, 21 U.S.C. section 360bbb-3(b)(1), unless the authorization is terminated or revoked.  Performed at [facility], [address] [place], [place] [address]   C Difficile Quick Screen w PCR reflex     Status: None   Collection Time: 10/04/23  4:00 PM   Specimen: STOOL  Result Value Ref Range Status   C Diff antigen NEGATIVE NEGATIVE Final   C Diff toxin NEGATIVE NEGATIVE Final   C Diff interpretation No C. difficile detected.  Final    Comment: Performed at [facility], [address], [place], [place] [address]     Radiology Studies: No results found.   Scheduled Meds:  aspirin  EC  81 mg Oral Daily   atorvastatin   80 mg Oral Daily   budeson-glycopyrrolate -formoterol   2 puff Inhalation BID   citalopram   30 mg Oral Daily   docusate sodium   100 mg Oral BID   enoxaparin  (LOVENOX ) injection  40 mg Subcutaneous Q24H   ferrous sulfate   325 mg Oral QAC breakfast   fluticasone   2 spray Each Nare Daily   hydrOXYzine   25 mg Oral Daily   ipratropium-albuterol   3 mL Nebulization TID   isosorbide  mononitrate  15 mg Oral Daily   lisinopril   10 mg Oral  Daily   loratadine   10 mg Oral Daily   methylPREDNISolone  (SOLU-MEDROL ) injection  40 mg Intravenous Q12H   metoprolol  succinate  12.5 mg Oral QPM   pantoprazole   40 mg Oral BID   senna-docusate  1 tablet Oral QHS   ticagrelor   90 mg Oral BID   Continuous Infusions:   LOS: 2 days  MDM: Patient is high risk for one or more organ failure.  They necessitate ongoing hospitalization for continued IV therapies and subsequent lab monitoring. Total time spent interpreting labs and vitals, reviewing the medical record, coordinating care amongst consultants and care team members, directly assessing and discussing care with the patient and/or family: 55 min Maryln Eastham, DO [facility]  To contact the attending physician  between 7A-7P please use Epic Chat. To contact the covering physician during after hours 7P-7A, please review Amion.  10/07/2023, 4:07 PM   *This document has been created with the assistance of dictation software. Please excuse typographical errors. *

## 2023-10-08 DIAGNOSIS — J471 Bronchiectasis with (acute) exacerbation: Secondary | ICD-10-CM | POA: Diagnosis not present

## 2023-10-08 DIAGNOSIS — J441 Chronic obstructive pulmonary disease with (acute) exacerbation: Secondary | ICD-10-CM | POA: Diagnosis not present

## 2023-10-08 MED ORDER — LOPERAMIDE HCL 2 MG PO CAPS
2.0000 mg | ORAL_CAPSULE | ORAL | Status: DC | PRN
Start: 2023-10-08 — End: 2023-10-08
  Administered 2023-10-08: 2 mg via ORAL
  Filled 2023-10-08: qty 1

## 2023-10-08 MED ORDER — LISINOPRIL 20 MG PO TABS: 20.0000 mg | ORAL_TABLET | Freq: Every day | ORAL | 0 refills | Status: AC

## 2023-10-08 MED ORDER — PREDNISONE 20 MG PO TABS: ORAL_TABLET | ORAL | 0 refills | Status: AC

## 2023-10-08 NOTE — TOC Transition Note (Signed)
 Transition of Care [facility]) - Discharge Note   Patient Details  Name: Terri Burns MRN: [id number] Date of Birth: 1951-04-18  Transition of Care [facility]) CM/SW Contact:  Alexandra Ice, RN Phone Number: 10/08/2023, 1:03 PM   Clinical Narrative:     Patient to discharge today, home with home health. Home health arranged with [facility], added to AVS. Patient family to bring oxygen tank from home for discharge.   Final next level of care: Home w Home Health Services Barriers to Discharge: Barriers Resolved   Patient Goals and CMS Choice            Discharge Placement                  Name of family member notified: Family Patient and family notified of of transfer: 10/08/23  Discharge Plan and Services Additional resources added to the After Visit Summary for     Discharge Planning Services: CM Consult              DME Agency: NA       HH Arranged: PT, OT HH Agency: [facility] Date [facility] Agency Contacted: 10/08/23 Time HH Agency Contacted: [phone]    Social Drivers of Health (SDOH) Interventions SDOH Screenings   Food Insecurity: Food Insecurity Present (10/04/2023)  Housing: Low Risk  (10/04/2023)  Transportation Needs: No Transportation Needs (10/04/2023)  Utilities: Not At Risk (10/04/2023)  Alcohol Screen: Low Risk  (07/13/2021)  Depression (PHQ2-9): Low Risk  (05/11/2022)  Financial Resource Strain: Medium Risk (07/04/2023)  Physical Activity: Insufficiently Active (07/13/2021)  Social Connections: Socially Isolated (10/04/2023)  Stress: No Stress Concern Present (07/13/2021)  Tobacco Use: High Risk (10/04/2023)     Readmission Risk Interventions     No data to display

## 2023-10-08 NOTE — Discharge Summary (Signed)
 Physician Discharge Summary   Patient: Terri Burns MRN: [id number] DOB: [birth year]  Admit date:     10/04/2023  Discharge date: 10/08/23  Discharge Physician: Roise Cleaver   PCP: Benuel Brazier, MD   Recommendations at discharge:   Follow-up closely with primary care and pulmonology for chronic medication management  Discharge Diagnoses: Principal Problem:   COPD (chronic obstructive pulmonary disease) (HCC) Active Problems:   COPD exacerbation (HCC)   Acute exacerbation of bronchiectasis (HCC)   COPD with acute exacerbation (HCC)  Resolved Problems:   * No resolved hospital problems. *  Hospital Course: Terri Burns is a 73 year old female with COPD, chronic heart failure with preserved EF, chronic hypoxic respiratory failure on 2 L as needed at home, hypertension, anxiety, depression, chronic iron  deficiency anemia secondary to bleeding peptic ulcer, GERD, cigarette smoking, who presented with new onset cough, wheeze, and shortness of breath. Patient reports she turned up her home oxygen with minimal relief. In the ED she was found to be afebrile, nontachycardic, blood pressure 180/90. She required BiPAP to maintain O2 sats above 90%. Chest x-ray was negative for acute infiltrates. Patient was given DuoNebs. She was able to quickly wean off of BiPAP.  Gradually her oxygen requirement returned to baseline with the assistance of steroids, nebulizer therapies, pulmonary toilet, and one-time IV push of Lasix .  Evaluation of 5/4 patient endorsed feeling back to her baseline and ready for discharge home.  Home health arrangements were made prior to DC.  Acute on chronic hypoxic and hypercapnic respiratory failure COPD exacerbation Compensated combined respiratory acidosis - Initially required BiPAP, has now been weaned to 2L - Continue with ICS and LAMA - Continue with as needed DuoNebs - Encourage ongoing use of incentive spirometer and flutter valve   Chronic heart  failure with preserved EF - s/p IV Lasix  push x 1 given concerns of pulmonary congestion, had improvement. - Blood pressure trending upward.  Have restarted lisinopril .  Continue home dose Imdur  and metoprolol  -Titrate GDMT as BP tolerates --Outpatient follow up for further med titration   Tobacco abuse - Counseled on cessation   Chronic iron  deficiency anemia - Hemoglobin stable from 2 months prior - Continue home dose iron  supplement   Chronic neck and back pain - Takes Norco daily, will resume home meds - Continue PT/OT   Eustachian tube dysfunction - Proceed with saline nasal spray, Flonase , antihistamine  Consultants:  Procedures performed:   Disposition: Home health Diet recommendation:  Discharge Diet Orders (From admission, onward)     Start     Ordered   10/08/23 0000  Diet general        10/08/23 1237           Regular diet DISCHARGE MEDICATION: Allergies as of 10/08/2023       Reactions   Levofloxacin Shortness Of Breath   Penicillins Hives   itching   Meloxicam     dizziness   Nsaids Other (See Comments)   Patient states she can tolerate up to three doses per day without incident        Medication List     TAKE these medications    Accu-Chek Guide test strip Generic drug: glucose blood USE AS DIRECTED TO Check blood glucose   Accu-Chek Softclix Lancets lancets as directed.   albuterol  (2.5 MG/3ML) 0.083% nebulizer solution Commonly known as: PROVENTIL  Take 3 mLs (2.5 mg total) by nebulization 4 (four) times daily.   albuterol  108 (90 Base) MCG/ACT inhaler Commonly known as:  Ventolin  HFA Inhale 2 puffs into the lungs every 4 (four) hours as needed for wheezing or shortness of breath.   aspirin  EC 81 MG tablet Take 1 tablet (81 mg total) by mouth daily. Swallow whole.   atorvastatin  40 MG tablet Commonly known as: LIPITOR  Take 2 tablets (80 mg total) by mouth daily.   B-12 1000 MCG Tabs Take 1 tablet (1,000 mcg total) by mouth  daily.   Brilinta  90 MG Tabs tablet Generic drug: ticagrelor  Take 1 tablet (90 mg total) by mouth 2 (two) times daily.   citalopram  20 MG tablet Commonly known as: CELEXA  Take 1.5 tablets (30 mg total) by mouth daily.   docusate sodium  100 MG capsule Commonly known as: COLACE Take 1 capsule (100 mg total) by mouth 2 (two) times daily.   FeroSul 325 (65 Fe) MG tablet Generic drug: ferrous sulfate  Take 1 tablet (325 mg total) by mouth daily before breakfast. Take on an empty stomach with one cup of juice, preferably orange juice   fluticasone  50 MCG/ACT nasal spray Commonly known as: FLONASE  Place 2 sprays into both nostrils daily.   FT Stool Softener 50-8.6 MG tablet Generic drug: senna-docusate Take 1 tablet by mouth at bedtime.   HYDROcodone -acetaminophen  10-325 MG tablet Commonly known as: NORCO Take 1 tablet by mouth 4 (four) times daily as needed.   hydrOXYzine  25 MG tablet Commonly known as: ATARAX  Take 1 tablet (25 mg total) by mouth daily.   ipratropium-albuterol  0.5-2.5 (3) MG/3ML Soln Commonly known as: DUONEB Take 3 mLs by nebulization every 6 (six) hours as needed.   isosorbide  mononitrate 30 MG 24 hr tablet Commonly known as: IMDUR  Take 15 mg by mouth daily.   lisinopril  20 MG tablet Commonly known as: ZESTRIL  Take 1 tablet (20 mg total) by mouth daily.   loratadine  10 MG tablet Commonly known as: Allergy Relief Take 1 tablet (10 mg total) by mouth daily.   metoprolol  succinate 25 MG 24 hr tablet Commonly known as: TOPROL -XL Take 0.5 tablets (12.5 mg total) by mouth every evening.   Narcan  4 MG/0.1ML Liqd nasal spray kit Generic drug: naloxone  1 spray as needed. As needed in case of overdose   pantoprazole  40 MG tablet Commonly known as: PROTONIX  Take 1 tablet (40 mg total) by mouth 2 (two) times daily.   predniSONE  20 MG tablet Commonly known as: DELTASONE  Take 2 tablets (40 mg total) by mouth daily with breakfast for 2 days, THEN 1 tablet  (20 mg total) daily with breakfast for 2 days, THEN 0.5 tablets (10 mg total) daily with breakfast for 2 days. Start taking on: Oct 08, 2023 What changed: See the new instructions.   Trelegy Ellipta  100-62.5-25 MCG/ACT Aepb Generic drug: Fluticasone -Umeclidin-Vilant Inhale 1 puff into the lungs daily.        Follow-up Information     Entzminger, Iantha Mainland, MD Follow up.   Specialty: Internal Medicine Why: Hospital follow up Contact information: [address] [place] [address] [phone]         [facility] Follow up.   Specialty: Home Health Services Why: Agency will call to schedule Contact information: [address] [place] [address] [phone]                Discharge Exam: Cleavon Curls Weights   10/04/23 0257  Weight: 55 kg   General exam: Appears calm and uncomfortable Respiratory system: dyspneic when speaking, some upper airway sounds, on 2 L oxygen, appears more comfortable than  prior exams  Cardiovascular system: S1 & S2 heard, RRR.  Gastrointestinal system: Abdomen is nondistended, soft and nontender.  Neuro: Alert and oriented. No focal neurological deficits. Extremities: Symmetric, expected ROM, no edema Skin: No rashes, lesions Psychiatry: Demonstrates appropriate judgement and insight. Mood & affect appropriate for situation.   Condition at discharge: stable  The results of significant diagnostics from this hospitalization (including imaging, microbiology, ancillary and laboratory) are listed below for reference.   Imaging Studies: DG Chest 1 View Result Date: 10/05/2023 CLINICAL DATA:  COPD. EXAM: CHEST  1 VIEW COMPARISON:  10/04/2023 1 and 07/02/2023 FINDINGS: Chronic linear densities in the lower lungs bilaterally. These findings have not significantly changed. No new airspace disease or consolidation. Heart size is stable. Atherosclerotic calcifications at the aortic arch. IMPRESSION: Chronic linear densities in the  lower lungs. No acute cardiopulmonary disease. Electronically Signed   By: Elene Griffes M.D.   On: 10/05/2023 09:35   DG Chest Port 1 View Result Date: 10/04/2023 CLINICAL DATA:  Dyspnea EXAM: PORTABLE CHEST 1 VIEW COMPARISON:  07/14/2013 FINDINGS: Stable bibasilar parenchymal scarring. No superimposed focal pulmonary infiltrate. Stable right basilar pleural thickening. No pneumothorax or pleural effusion. Cardiac size within normal limits. Pulmonary vascularity is normal. No acute bone abnormality. IMPRESSION: 1. No active disease. Stable bibasilar parenchymal scarring. Electronically Signed   By: Worthy Heads M.D.   On: 10/04/2023 03:21    Microbiology: Results for orders placed or performed during the hospital encounter of 10/04/23  Resp panel by RT-PCR (RSV, Flu A&B, Covid) Anterior Nasal Swab     Status: None   Collection Time: 10/04/23  2:49 AM   Specimen: Anterior Nasal Swab  Result Value Ref Range Status   SARS Coronavirus 2 by RT PCR NEGATIVE NEGATIVE Final    Comment: (NOTE) SARS-CoV-2 target nucleic acids are NOT DETECTED.  The SARS-CoV-2 RNA is generally detectable in upper respiratory specimens during the acute phase of infection. The lowest concentration of SARS-CoV-2 viral copies this assay can detect is 138 copies/mL. A negative result does not preclude SARS-Cov-2 infection and should not be used as the sole basis for treatment or other patient management decisions. A negative result may occur with  improper specimen collection/handling, submission of specimen other than nasopharyngeal swab, presence of viral mutation(s) within the areas targeted by this assay, and inadequate number of viral copies(<138 copies/mL). A negative result must be combined with clinical observations, patient history, and epidemiological information. The expected result is Negative.  Fact Sheet for Patients:  BloggerCourse.com  Fact Sheet for Healthcare Providers:   SeriousBroker.it  This test is no t yet approved or cleared by the [place]  FDA and  has been authorized for detection and/or diagnosis of SARS-CoV-2 by FDA under an Emergency Use Authorization (EUA). This EUA will remain  in effect (meaning this test can be used) for the duration of the COVID-19 declaration under Section 564(b)(1) of the Act, 21 U.S.C.section 360bbb-3(b)(1), unless the authorization is terminated  or revoked sooner.       Influenza A by PCR NEGATIVE NEGATIVE Final   Influenza B by PCR NEGATIVE NEGATIVE Final    Comment: (NOTE) The Xpert Xpress SARS-CoV-2/FLU/RSV plus assay is intended as an aid in the diagnosis of influenza from Nasopharyngeal swab specimens and should not be used as a sole basis for treatment. Nasal washings and aspirates are unacceptable for Xpert Xpress SARS-CoV-2/FLU/RSV testing.  Fact Sheet for Patients: BloggerCourse.com  Fact Sheet for Healthcare Providers: SeriousBroker.it  This test is not yet  approved or cleared by the [place]  FDA and has been authorized for detection and/or diagnosis of SARS-CoV-2 by FDA under an Emergency Use Authorization (EUA). This EUA will remain in effect (meaning this test can be used) for the duration of the COVID-19 declaration under Section 564(b)(1) of the Act, 21 U.S.C. section 360bbb-3(b)(1), unless the authorization is terminated or revoked.     Resp Syncytial Virus by PCR NEGATIVE NEGATIVE Final    Comment: (NOTE) Fact Sheet for Patients: BloggerCourse.com  Fact Sheet for Healthcare Providers: SeriousBroker.it  This test is not yet approved or cleared by the [place]  FDA and has been authorized for detection and/or diagnosis of SARS-CoV-2 by FDA under an Emergency Use Authorization (EUA). This EUA will remain in effect (meaning this test can be used) for  the duration of the COVID-19 declaration under Section 564(b)(1) of the Act, 21 U.S.C. section 360bbb-3(b)(1), unless the authorization is terminated or revoked.  Performed at [facility], [address] [place], [place] [address]   C Difficile Quick Screen w PCR reflex     Status: None   Collection Time: 10/04/23  4:00 PM   Specimen: STOOL  Result Value Ref Range Status   C Diff antigen NEGATIVE NEGATIVE Final   C Diff toxin NEGATIVE NEGATIVE Final   C Diff interpretation No C. difficile detected.  Final    Comment: Performed at [facility], [address] [place], [place] [address]    Labs: CBC: Recent Labs  Lab 10/04/23 0303  WBC 10.7*  NEUTROABS 5.4  HGB 11.8*  HCT 38.1  MCV 92.5  PLT 275   Basic Metabolic Panel: Recent Labs  Lab 10/04/23 0303  NA 138  K 4.0  CL 103  CO2 27  GLUCOSE 171*  BUN 14  CREATININE 0.56  CALCIUM  8.8*   Liver Function Tests: Recent Labs  Lab 10/04/23 0303  AST 42*  ALT 31  ALKPHOS 58  BILITOT 0.4  PROT 7.0  ALBUMIN 4.1   CBG: No results for input(s): "GLUCAP" in the last 168 hours.  Discharge time spent: 32 minutes.  Signed: Loi Rennaker, DO [facility] 10/08/2023

## 2023-10-08 NOTE — Discharge Instructions (Signed)
 [facility].  [address] , [place], [place], [address]. [phone] They will call you to arrange when they can come to see you

## 2023-10-08 NOTE — Care Management Important Message (Signed)
 Important Message  Patient Details  Name: Terri Burns MRN: [id number] Date of Birth: 01/20/1951   Important Message Given:  Yes - Medicare IM     Anise Kerns 10/08/2023, 1:33 PM

## 2023-11-29 ENCOUNTER — Ambulatory Visit: Admission: RE | Admit: 2023-11-29 | Source: Ambulatory Visit

## 2024-04-16 ENCOUNTER — Emergency Department
Admission: EM | Admit: 2024-04-16 | Discharge: 2024-04-16 | Disposition: A | Discharge status: Home / Self Care | Attending: Emergency Medicine | Admitting: Emergency Medicine

## 2024-04-16 ENCOUNTER — Other Ambulatory Visit: Payer: Self-pay

## 2024-04-16 ENCOUNTER — Emergency Department

## 2024-04-16 DIAGNOSIS — I5030 Unspecified diastolic (congestive) heart failure: Secondary | ICD-10-CM | POA: Diagnosis not present

## 2024-04-16 DIAGNOSIS — D649 Anemia, unspecified: Secondary | ICD-10-CM | POA: Insufficient documentation

## 2024-04-16 DIAGNOSIS — R0602 Shortness of breath: Secondary | ICD-10-CM | POA: Diagnosis present

## 2024-04-16 DIAGNOSIS — I110 Hypertensive heart disease with heart failure: Secondary | ICD-10-CM | POA: Insufficient documentation

## 2024-04-16 DIAGNOSIS — J441 Chronic obstructive pulmonary disease with (acute) exacerbation: Secondary | ICD-10-CM | POA: Diagnosis not present

## 2024-04-16 LAB — URINALYSIS, ROUTINE W REFLEX MICROSCOPIC
Bilirubin Urine: NEGATIVE
Glucose, UA: NEGATIVE mg/dL
Ketones, ur: NEGATIVE mg/dL
Leukocytes,Ua: NEGATIVE
Nitrite: NEGATIVE
Protein, ur: NEGATIVE mg/dL
Specific Gravity, Urine: 1.006 (ref 1.005–1.030)
pH: 6 (ref 5.0–8.0)

## 2024-04-16 LAB — CBC WITH DIFFERENTIAL/PLATELET
Abs Immature Granulocytes: 0.02 K/uL (ref 0.00–0.07)
Basophils Absolute: 0.1 K/uL (ref 0.0–0.1)
Basophils Relative: 1 %
Eosinophils Absolute: 0.2 K/uL (ref 0.0–0.5)
Eosinophils Relative: 3 %
HCT: 34.4 % — ABNORMAL LOW (ref 36.0–46.0)
Hemoglobin: 10.7 g/dL — ABNORMAL LOW (ref 12.0–15.0)
Immature Granulocytes: 0 %
Lymphocytes Relative: 20 %
Lymphs Abs: 1.5 K/uL (ref 0.7–4.0)
MCH: 28.9 pg (ref 26.0–34.0)
MCHC: 31.1 g/dL (ref 30.0–36.0)
MCV: 93 fL (ref 80.0–100.0)
Monocytes Absolute: 0.7 K/uL (ref 0.1–1.0)
Monocytes Relative: 9 %
Neutro Abs: 4.9 K/uL (ref 1.7–7.7)
Neutrophils Relative %: 67 %
Platelets: 231 K/uL (ref 150–400)
RBC: 3.7 MIL/uL — ABNORMAL LOW (ref 3.87–5.11)
RDW: 14.6 % (ref 11.5–15.5)
WBC: 7.3 K/uL (ref 4.0–10.5)
nRBC: 0 % (ref 0.0–0.2)

## 2024-04-16 LAB — COMPREHENSIVE METABOLIC PANEL WITH GFR
ALT: 19 U/L (ref 0–44)
AST: 23 U/L (ref 15–41)
Albumin: 3.7 g/dL (ref 3.5–5.0)
Alkaline Phosphatase: 62 U/L (ref 38–126)
Anion gap: 8 (ref 5–15)
BUN: 13 mg/dL (ref 8–23)
CO2: 28 mmol/L (ref 22–32)
Calcium: 8.7 mg/dL — ABNORMAL LOW (ref 8.9–10.3)
Chloride: 101 mmol/L (ref 98–111)
Creatinine, Ser: 0.63 mg/dL (ref 0.44–1.00)
GFR, Estimated: >60 mL/min (ref >60)
Glucose, Bld: 85 mg/dL (ref 70–99)
Potassium: 4.9 mmol/L (ref 3.5–5.1)
Sodium: 137 mmol/L (ref 135–145)
Total Bilirubin: 0.3 mg/dL (ref 0.0–1.2)
Total Protein: 6.1 g/dL — ABNORMAL LOW (ref 6.5–8.1)

## 2024-04-16 LAB — RESP PANEL BY RT-PCR (RSV, FLU A&B, COVID)  RVPGX2
Influenza A by PCR: NEGATIVE
Influenza B by PCR: NEGATIVE
Resp Syncytial Virus by PCR: NEGATIVE
SARS Coronavirus 2 by RT PCR: NEGATIVE

## 2024-04-16 LAB — TROPONIN T, HIGH SENSITIVITY
Troponin T High Sensitivity: 24 ng/L — ABNORMAL HIGH (ref 0–19)
Troponin T High Sensitivity: 24 ng/L — ABNORMAL HIGH (ref 0–19)

## 2024-04-16 LAB — PRO BRAIN NATRIURETIC PEPTIDE: Pro Brain Natriuretic Peptide: 1858 pg/mL — ABNORMAL HIGH (ref <300.0)

## 2024-04-16 MED ORDER — SODIUM CHLORIDE 0.9 % IV BOLUS
500.0000 mL | Freq: Once | INTRAVENOUS | Status: AC
  Administered 2024-04-16: 500 mL via INTRAVENOUS

## 2024-04-16 MED ORDER — IPRATROPIUM-ALBUTEROL 0.5-2.5 (3) MG/3ML IN SOLN
3.0000 mL | Freq: Once | RESPIRATORY_TRACT | Status: AC
  Administered 2024-04-16: 3 mL via RESPIRATORY_TRACT
  Filled 2024-04-16: qty 3

## 2024-04-16 MED ORDER — PREDNISONE 50 MG PO TABS: 50.0000 mg | ORAL_TABLET | Freq: Every day | ORAL | 0 refills | Status: AC

## 2024-04-16 MED ORDER — METHYLPREDNISOLONE SODIUM SUCC 125 MG IJ SOLR
125.0000 mg | Freq: Once | INTRAMUSCULAR | Status: AC
  Administered 2024-04-16: 125 mg via INTRAVENOUS
  Filled 2024-04-16: qty 2

## 2024-04-16 NOTE — ED Triage Notes (Signed)
 Patient to ED via [facility] from home. Patient states she got up today and started feeling weak and SOB. Patient has Hx of COPD and wears 2L [place] at baseline. Patient states she has also had a productive cough today. Patient took her inhaler today with no relief. Patient received 2 duonebs in route with EMS.

## 2024-04-16 NOTE — ED Notes (Signed)
 CCMD called to admit patient to the monitor.

## 2024-04-16 NOTE — ED Provider Notes (Signed)
 [facility] Provider Note    Event Date/Time   First MD Initiated Contact with Patient 04/16/24 1545     (approximate)   History   Shortness of Breath   HPI  Terri Burns is a 73 y.o. female with a history of COPD, HFpEF, respiratory failure on 2 L at home, hypertension, anxiety, iron  deficiency anemia, GERD who presents with shortness of breath, relatively acute onset this afternoon, associated with cough.  She has no associated fever.  She denies any chest pain or new or worsening leg swelling.  Her symptoms were not relieved by albuterol  inhalers at home.  I reviewed the past medical records.  The patient's most recent inpatient admission was to the hospitalist service in May with his COPD exacerbation.  She required BiPAP at that time.   Physical Exam   Triage Vital Signs: ED Triage Vitals  Encounter Vitals Group     BP      Girls Systolic BP Percentile      Girls Diastolic BP Percentile      Boys Systolic BP Percentile      Boys Diastolic BP Percentile      Pulse      Resp      Temp      Temp src      SpO2      Weight      Height      Head Circumference      Peak Flow      Pain Score      Pain Loc      Pain Education      Exclude from Growth Chart     Most recent vital signs: Vitals:   04/16/24 1554  BP: (!) 173/93  Pulse: 61  Resp: 17  Temp: 98.5 F (36.9 C)  SpO2: 100%     General: Awake, no distress.  CV:  Good peripheral perfusion.  Resp:  Somewhat increased respiratory effort.  Decreased breath sounds bilaterally with diffuse wheezing. Abd:  No distention.  Other:  Trace bilateral lower extremity edema.   ED Results / Procedures / Treatments   Labs (all labs ordered are listed, but only abnormal results are displayed) Labs Reviewed  COMPREHENSIVE METABOLIC PANEL WITH GFR - Abnormal; Notable for the following components:      Result Value   Calcium  8.7 (*)    Total Protein 6.1 (*)    All other components  within normal limits  CBC WITH DIFFERENTIAL/PLATELET - Abnormal; Notable for the following components:   RBC 3.70 (*)    Hemoglobin 10.7 (*)    HCT 34.4 (*)    All other components within normal limits  PRO BRAIN NATRIURETIC PEPTIDE - Abnormal; Notable for the following components:   Pro Brain Natriuretic Peptide 1,858.0 (*)    All other components within normal limits  URINALYSIS, ROUTINE W REFLEX MICROSCOPIC - Abnormal; Notable for the following components:   Color, Urine STRAW (*)    APPearance CLEAR (*)    Hgb urine dipstick MODERATE (*)    Bacteria, UA MANY (*)    All other components within normal limits  TROPONIN T, HIGH SENSITIVITY - Abnormal; Notable for the following components:   Troponin T High Sensitivity 24 (*)    All other components within normal limits  TROPONIN T, HIGH SENSITIVITY - Abnormal; Notable for the following components:   Troponin T High Sensitivity 24 (*)    All other components within normal limits  RESP PANEL  BY RT-PCR (RSV, FLU A&B, COVID)  [id number]     EKG  ED ECG REPORT I, Waylon Cassis, the attending physician, personally viewed and interpreted this ECG.  Date: 04/16/2024 EKG Time: 1550 Rate: ST 3 Rhythm: normal sinus rhythm QRS Axis: normal Intervals: normal ST/T Wave abnormalities: normal Narrative Interpretation: no evidence of acute ischemia    RADIOLOGY  Chest x-ray: I independently viewed and interpreted the images; there is no focal consolidation or edema  PROCEDURES:  Critical Care performed: No  Procedures   MEDICATIONS ORDERED IN ED: Medications  sodium chloride  0.9 % bolus 500 mL (0 mLs Intravenous Stopped 04/16/24 1658)  ipratropium-albuterol  (DUONEB) 0.5-2.5 (3) MG/3ML nebulizer solution 3 mL (3 mLs Nebulization Given 04/16/24 1619)  ipratropium-albuterol  (DUONEB) 0.5-2.5 (3) MG/3ML nebulizer solution 3 mL (3 mLs Nebulization Given 04/16/24 1619)  methylPREDNISolone  sodium succinate (SOLU-MEDROL ) 125 mg/2 mL  injection 125 mg (125 mg Intravenous Given 04/16/24 1619)     IMPRESSION / MDM / ASSESSMENT AND PLAN / ED COURSE  I reviewed the triage vital signs and the nursing notes.  74 year old female with PMH as noted above presents with increased shortness of breath and cough.  On exam she has diffuse wheezing bilaterally.  O2 saturation is 100% on her baseline 2 L.  Differential diagnosis includes, but is not limited to, COPD exacerbation, acute bronchitis, viral syndrome, bacterial pneumonia, CHF exacerbation.  Will obtain chest x-ray, lab workup, give bronchodilators, steroid, and reassess. Patient's presentation is most consistent with acute presentation with potential threat to life or bodily function.  The patient is on the cardiac monitor to evaluate for evidence of arrhythmia and/or significant heart rate changes.  ----------------------------------------- 6:44 PM on 04/16/2024 -----------------------------------------  Chest x-ray is clear.  Lab workup is reassuring.  CMP shows no acute findings.  CBC shows mild anemia but no leukocytosis.  Troponins are minimally elevated but there is no increase from the first the second.  This is consistent with the patient's respiratory distress earlier and her CHF history.  There is no evidence of an acute cardiac component to her presentation today.  BNP is elevated by suspect that this is also chronic.  There is no evidence of a CHF exacerbation currently.  Respiratory panel is negative.  I did consider whether the patient may benefit from inpatient admission given her age and comorbidities, as well as the severity of her shortness of breath earlier.  I offered admission to her.  However, the patient states she is feeling significantly better.  She is on her baseline oxygen.  We attempted a trial of ambulation, the patient did it without difficulty.  She states she would strongly prefer to go home.  Given the reassuring workup, I think that this is  reasonable.  I counseled her on the results and the plan of care.  I have prescribed additional steroid.  She has albuterol  at home.  There is no indication for antibiotics.  I gave strict return precautions, she expressed understanding.    FINAL CLINICAL IMPRESSION(S) / ED DIAGNOSES   Final diagnoses:  COPD exacerbation (HCC)     Rx / DC Orders   ED Discharge Orders          Ordered    predniSONE  (DELTASONE ) 50 MG tablet  Daily        04/16/24 1843             Note:  This document was prepared using Dragon voice recognition software and may include unintentional dictation errors.  Jacolyn Pae, MD 04/16/24 [phone]

## 2024-04-16 NOTE — Discharge Instructions (Signed)
 Use your albuterol  inhaler every 4-6 hours for the next several days.  Take the prednisone  daily as prescribed, starting tomorrow.  You already received a steroid dose today.  Follow-up with your primary care provider.  Return to the ER immediately for new, worsening, or persistent severe shortness of breath, cough, fever, chest discomfort, or any other new or worsening symptoms that concern you.

## 2024-04-16 NOTE — ED Notes (Signed)
 Patient ambulated with assistance from this RN. Patient maintained a steady gait and did not display SOB. Patient ambulated to toilet and back to bed without complications. MD Siadecki made aware.

## 2024-07-09 ENCOUNTER — Encounter: Payer: Self-pay | Admitting: Acute Care
